# Patient Record
Sex: Female | Born: 1945 | Race: Black or African American | Hispanic: No | Smoking: Never smoker
Health system: Southern US, Community
[De-identification: ages and names within clinical notes are randomized; demographics above are authoritative.]

## PROBLEM LIST (undated history)

## (undated) DIAGNOSIS — Z8701 Personal history of pneumonia (recurrent): Secondary | ICD-10-CM

## (undated) DIAGNOSIS — E669 Obesity, unspecified: Secondary | ICD-10-CM

## (undated) DIAGNOSIS — M5030 Other cervical disc degeneration, unspecified cervical region: Secondary | ICD-10-CM

## (undated) DIAGNOSIS — I639 Cerebral infarction, unspecified: Secondary | ICD-10-CM

## (undated) DIAGNOSIS — E039 Hypothyroidism, unspecified: Secondary | ICD-10-CM

## (undated) DIAGNOSIS — M5136 Other intervertebral disc degeneration, lumbar region: Secondary | ICD-10-CM

## (undated) DIAGNOSIS — M51369 Other intervertebral disc degeneration, lumbar region without mention of lumbar back pain or lower extremity pain: Secondary | ICD-10-CM

## (undated) DIAGNOSIS — L80 Vitiligo: Secondary | ICD-10-CM

## (undated) DIAGNOSIS — R42 Dizziness and giddiness: Secondary | ICD-10-CM

## (undated) DIAGNOSIS — C9000 Multiple myeloma not having achieved remission: Secondary | ICD-10-CM

## (undated) DIAGNOSIS — J439 Emphysema, unspecified: Secondary | ICD-10-CM

## (undated) DIAGNOSIS — E785 Hyperlipidemia, unspecified: Secondary | ICD-10-CM

## (undated) DIAGNOSIS — I10 Essential (primary) hypertension: Secondary | ICD-10-CM

## (undated) HISTORY — PX: NEPHRECTOMY: SHX65

## (undated) HISTORY — DX: Vitiligo: L80

## (undated) HISTORY — DX: Obesity, unspecified: E66.9

## (undated) HISTORY — DX: Essential (primary) hypertension: I10

## (undated) HISTORY — DX: Hyperlipidemia, unspecified: E78.5

## (undated) HISTORY — DX: Emphysema, unspecified: J43.9

## (undated) HISTORY — DX: Personal history of pneumonia (recurrent): Z87.01

## (undated) HISTORY — DX: Cerebral infarction, unspecified: I63.9

## (undated) HISTORY — DX: Hypothyroidism, unspecified: E03.9

## (undated) HISTORY — DX: Multiple myeloma not having achieved remission: C90.00

## (undated) HISTORY — DX: Dizziness and giddiness: R42

---

## 1981-12-23 HISTORY — PX: TUBAL LIGATION: SHX77

## 1998-04-24 ENCOUNTER — Encounter: Admission: RE | Admit: 1998-04-24 | Discharge: 1998-07-23 | Payer: Self-pay

## 2000-12-31 ENCOUNTER — Other Ambulatory Visit: Admission: RE | Admit: 2000-12-31 | Discharge: 2000-12-31 | Payer: Self-pay | Admitting: Obstetrics and Gynecology

## 2005-12-27 ENCOUNTER — Ambulatory Visit: Payer: Self-pay | Admitting: Family Medicine

## 2006-05-12 ENCOUNTER — Encounter (INDEPENDENT_AMBULATORY_CARE_PROVIDER_SITE_OTHER): Payer: Self-pay | Admitting: *Deleted

## 2006-05-12 ENCOUNTER — Other Ambulatory Visit: Admission: RE | Admit: 2006-05-12 | Discharge: 2006-05-12 | Payer: Self-pay | Admitting: Family Medicine

## 2006-05-12 ENCOUNTER — Ambulatory Visit: Payer: Self-pay | Admitting: Family Medicine

## 2006-05-23 LAB — HM COLONOSCOPY

## 2006-07-15 ENCOUNTER — Ambulatory Visit: Payer: Self-pay | Admitting: Family Medicine

## 2006-11-17 ENCOUNTER — Ambulatory Visit: Payer: Self-pay | Admitting: Family Medicine

## 2007-05-13 ENCOUNTER — Other Ambulatory Visit: Admission: RE | Admit: 2007-05-13 | Discharge: 2007-05-13 | Payer: Self-pay | Admitting: Family Medicine

## 2007-05-14 ENCOUNTER — Encounter (INDEPENDENT_AMBULATORY_CARE_PROVIDER_SITE_OTHER): Payer: Self-pay | Admitting: *Deleted

## 2007-05-14 ENCOUNTER — Encounter: Payer: Self-pay | Admitting: Family Medicine

## 2007-05-14 ENCOUNTER — Ambulatory Visit: Payer: Self-pay | Admitting: Family Medicine

## 2007-05-15 ENCOUNTER — Encounter: Payer: Self-pay | Admitting: Family Medicine

## 2007-05-15 LAB — CONVERTED CEMR LAB
Candida species: NEGATIVE
Chlamydia, DNA Probe: NEGATIVE
Gardnerella vaginalis: NEGATIVE
Trichomonal Vaginitis: NEGATIVE

## 2007-06-18 ENCOUNTER — Ambulatory Visit: Payer: Self-pay | Admitting: Family Medicine

## 2007-06-18 LAB — CONVERTED CEMR LAB: TSH: 0.295 microintl units/mL — ABNORMAL LOW (ref 0.350–5.50)

## 2007-08-07 ENCOUNTER — Encounter: Payer: Self-pay | Admitting: Family Medicine

## 2007-08-17 ENCOUNTER — Ambulatory Visit: Payer: Self-pay | Admitting: Family Medicine

## 2007-08-17 LAB — CONVERTED CEMR LAB
Calcium: 8.9 mg/dL (ref 8.4–10.5)
Chloride: 105 meq/L (ref 96–112)
Glucose, Bld: 87 mg/dL (ref 70–99)
Potassium: 3.8 meq/L (ref 3.5–5.3)
Sodium: 140 meq/L (ref 135–145)

## 2007-08-20 ENCOUNTER — Encounter: Payer: Self-pay | Admitting: Family Medicine

## 2007-08-20 LAB — CONVERTED CEMR LAB
Albumin: 4.1 g/dL (ref 3.5–5.2)
Alkaline Phosphatase: 46 units/L (ref 39–117)
Bilirubin, Direct: <0.1 mg/dL (ref 0.0–0.3)

## 2007-11-06 ENCOUNTER — Ambulatory Visit (HOSPITAL_COMMUNITY): Admission: RE | Admit: 2007-11-06 | Discharge: 2007-11-06 | Payer: Self-pay | Admitting: Family Medicine

## 2007-11-11 ENCOUNTER — Ambulatory Visit: Payer: Self-pay | Admitting: Family Medicine

## 2007-11-12 ENCOUNTER — Ambulatory Visit (HOSPITAL_COMMUNITY): Admission: RE | Admit: 2007-11-12 | Discharge: 2007-11-12 | Payer: Self-pay | Admitting: Family Medicine

## 2007-11-13 ENCOUNTER — Encounter: Payer: Self-pay | Admitting: Family Medicine

## 2007-11-13 LAB — CONVERTED CEMR LAB
ALT: 9 units/L (ref 0–53)
AST: 12 units/L (ref 0–37)
Albumin: 4 g/dL (ref 3.5–5.2)
Alkaline Phosphatase: 47 units/L (ref 39–117)
Basophils Absolute: 0 10*3/uL (ref 0.0–0.1)
Basophils Relative: 0 % (ref 0–1)
Chloride: 107 meq/L (ref 96–112)
Cholesterol: 186 mg/dL (ref 0–200)
Creatinine, Ser: 0.95 mg/dL (ref 0.40–1.50)
Eosinophils Absolute: 0.1 10*3/uL — ABNORMAL LOW (ref 0.2–0.7)
Eosinophils Relative: 1 % (ref 0–5)
HCT: 31.2 % — ABNORMAL LOW (ref 39.0–52.0)
HDL: 39 mg/dL — ABNORMAL LOW (ref >39)
Hemoglobin: 11.1 g/dL — ABNORMAL LOW (ref 13.0–17.0)
MCHC: 35.6 g/dL (ref 30.0–36.0)
MCV: 86.7 fL (ref 78.0–100.0)
Neutrophils Relative %: 61 % (ref 43–77)
Potassium: 4.3 meq/L (ref 3.5–5.3)
Sodium: 141 meq/L (ref 135–145)
Total Bilirubin: 0.7 mg/dL (ref 0.3–1.2)
Total Protein: 8.9 g/dL — ABNORMAL HIGH (ref 6.0–8.3)

## 2007-11-16 ENCOUNTER — Encounter: Payer: Self-pay | Admitting: Family Medicine

## 2007-11-16 LAB — CONVERTED CEMR LAB: Retic Ct Pct: 0.8 % (ref 0.4–3.1)

## 2007-11-26 ENCOUNTER — Ambulatory Visit (HOSPITAL_COMMUNITY): Admission: RE | Admit: 2007-11-26 | Discharge: 2007-11-26 | Payer: Self-pay | Admitting: Family Medicine

## 2007-11-29 ENCOUNTER — Emergency Department (HOSPITAL_COMMUNITY): Admission: EM | Admit: 2007-11-29 | Discharge: 2007-11-29 | Payer: Self-pay | Admitting: Emergency Medicine

## 2007-12-04 ENCOUNTER — Encounter (INDEPENDENT_AMBULATORY_CARE_PROVIDER_SITE_OTHER): Payer: Self-pay | Admitting: Urology

## 2007-12-04 ENCOUNTER — Inpatient Hospital Stay (HOSPITAL_COMMUNITY): Admission: RE | Admit: 2007-12-04 | Discharge: 2007-12-08 | Payer: Self-pay | Admitting: Urology

## 2007-12-11 ENCOUNTER — Ambulatory Visit: Payer: Self-pay | Admitting: Vascular Surgery

## 2007-12-11 ENCOUNTER — Encounter (INDEPENDENT_AMBULATORY_CARE_PROVIDER_SITE_OTHER): Payer: Self-pay | Admitting: Internal Medicine

## 2007-12-11 ENCOUNTER — Inpatient Hospital Stay (HOSPITAL_COMMUNITY): Admission: EM | Admit: 2007-12-11 | Discharge: 2007-12-16 | Payer: Self-pay | Admitting: Emergency Medicine

## 2007-12-16 ENCOUNTER — Other Ambulatory Visit: Payer: Self-pay | Admitting: Cardiovascular Disease

## 2007-12-21 ENCOUNTER — Ambulatory Visit: Payer: Self-pay | Admitting: Family Medicine

## 2007-12-21 LAB — CONVERTED CEMR LAB
Potassium: 4.8 meq/L (ref 3.5–5.3)
Sodium: 136 meq/L (ref 135–145)

## 2007-12-24 ENCOUNTER — Encounter: Payer: Self-pay | Admitting: Family Medicine

## 2008-03-23 ENCOUNTER — Ambulatory Visit: Payer: Self-pay | Admitting: Family Medicine

## 2008-03-24 ENCOUNTER — Encounter: Payer: Self-pay | Admitting: Family Medicine

## 2008-03-24 LAB — CONVERTED CEMR LAB
ALT: 8 units/L (ref 0–35)
Albumin: 3.8 g/dL (ref 3.5–5.2)
Alkaline Phosphatase: 46 units/L (ref 39–117)
CO2: 22 meq/L (ref 19–32)
Creatinine, Ser: 1.12 mg/dL (ref 0.40–1.20)
HDL: 37 mg/dL — ABNORMAL LOW (ref >39)
Potassium: 4.1 meq/L (ref 3.5–5.3)
Sodium: 139 meq/L (ref 135–145)
Total CHOL/HDL Ratio: 4.8
Total Protein: 8.5 g/dL — ABNORMAL HIGH (ref 6.0–8.3)
Triglycerides: 138 mg/dL (ref <150)
VLDL: 28 mg/dL (ref 0–40)

## 2008-03-25 ENCOUNTER — Encounter: Payer: Self-pay | Admitting: Family Medicine

## 2008-03-25 LAB — CONVERTED CEMR LAB: RBC / HPF: NONE SEEN (ref <3)

## 2008-03-31 ENCOUNTER — Encounter (INDEPENDENT_AMBULATORY_CARE_PROVIDER_SITE_OTHER): Payer: Self-pay | Admitting: *Deleted

## 2008-03-31 DIAGNOSIS — E669 Obesity, unspecified: Secondary | ICD-10-CM

## 2008-03-31 DIAGNOSIS — E039 Hypothyroidism, unspecified: Secondary | ICD-10-CM

## 2008-03-31 DIAGNOSIS — R42 Dizziness and giddiness: Secondary | ICD-10-CM | POA: Insufficient documentation

## 2008-04-27 ENCOUNTER — Encounter: Payer: Self-pay | Admitting: Family Medicine

## 2008-04-27 ENCOUNTER — Ambulatory Visit: Payer: Self-pay | Admitting: Family Medicine

## 2008-04-27 ENCOUNTER — Other Ambulatory Visit: Admission: RE | Admit: 2008-04-27 | Discharge: 2008-04-27 | Payer: Self-pay | Admitting: Family Medicine

## 2008-04-28 ENCOUNTER — Encounter: Payer: Self-pay | Admitting: Family Medicine

## 2008-04-28 LAB — CONVERTED CEMR LAB
GC Probe Amp, Genital: NEGATIVE
Trichomonal Vaginitis: NEGATIVE

## 2008-05-03 ENCOUNTER — Ambulatory Visit (HOSPITAL_COMMUNITY): Admission: RE | Admit: 2008-05-03 | Discharge: 2008-05-03 | Payer: Self-pay | Admitting: Family Medicine

## 2008-05-09 ENCOUNTER — Ambulatory Visit: Payer: Self-pay | Admitting: Family Medicine

## 2008-05-10 ENCOUNTER — Encounter (HOSPITAL_COMMUNITY): Admission: RE | Admit: 2008-05-10 | Discharge: 2008-06-09 | Payer: Self-pay | Admitting: Family Medicine

## 2008-06-23 ENCOUNTER — Ambulatory Visit: Payer: Self-pay | Admitting: Family Medicine

## 2008-07-02 ENCOUNTER — Encounter: Payer: Self-pay | Admitting: Family Medicine

## 2008-07-02 LAB — CONVERTED CEMR LAB
ALT: 12 units/L (ref 0–35)
AST: 16 units/L (ref 0–37)
Alkaline Phosphatase: 43 units/L (ref 39–117)
Bilirubin, Direct: 0.5 mg/dL — ABNORMAL HIGH (ref 0.0–0.3)
Cholesterol: 169 mg/dL (ref 0–200)
Indirect Bilirubin: 0.1 mg/dL (ref 0.0–0.9)
Total Bilirubin: 0.6 mg/dL (ref 0.3–1.2)
Total CHOL/HDL Ratio: 3.7
Total Protein: 8.8 g/dL — ABNORMAL HIGH (ref 6.0–8.3)

## 2008-07-05 ENCOUNTER — Encounter: Payer: Self-pay | Admitting: Family Medicine

## 2008-07-05 LAB — CONVERTED CEMR LAB
Chloride: 105 meq/L (ref 96–112)
Glucose, Bld: 57 mg/dL — ABNORMAL LOW (ref 70–99)
Sodium: 141 meq/L (ref 135–145)

## 2008-07-06 ENCOUNTER — Ambulatory Visit: Payer: Self-pay | Admitting: Family Medicine

## 2008-07-22 ENCOUNTER — Telehealth: Payer: Self-pay | Admitting: Family Medicine

## 2008-07-22 ENCOUNTER — Encounter: Payer: Self-pay | Admitting: Family Medicine

## 2008-08-10 ENCOUNTER — Encounter: Payer: Self-pay | Admitting: Family Medicine

## 2008-11-10 ENCOUNTER — Encounter: Payer: Self-pay | Admitting: Family Medicine

## 2009-05-25 ENCOUNTER — Ambulatory Visit: Payer: Self-pay | Admitting: Family Medicine

## 2009-05-25 DIAGNOSIS — IMO0002 Reserved for concepts with insufficient information to code with codable children: Secondary | ICD-10-CM

## 2009-05-25 DIAGNOSIS — M542 Cervicalgia: Secondary | ICD-10-CM

## 2009-05-25 DIAGNOSIS — M6740 Ganglion, unspecified site: Secondary | ICD-10-CM | POA: Insufficient documentation

## 2009-05-26 ENCOUNTER — Encounter: Payer: Self-pay | Admitting: Family Medicine

## 2009-05-26 LAB — CONVERTED CEMR LAB
Albumin: 4 g/dL (ref 3.5–5.2)
Basophils Absolute: 0 10*3/uL (ref 0.0–0.1)
Basophils Relative: 0 % (ref 0–1)
CO2: 23 meq/L (ref 19–32)
Calcium: 9.2 mg/dL (ref 8.4–10.5)
Chloride: 106 meq/L (ref 96–112)
Glucose, Bld: 80 mg/dL (ref 70–99)
HCT: 31.5 % — ABNORMAL LOW (ref 36.0–46.0)
HDL: 33 mg/dL — ABNORMAL LOW (ref >39)
LDL Cholesterol: 115 mg/dL — ABNORMAL HIGH (ref 0–99)
Lymphocytes Relative: 32 % (ref 12–46)
Monocytes Absolute: 0.5 10*3/uL (ref 0.1–1.0)
Monocytes Relative: 8 % (ref 3–12)
Platelets: 274 10*3/uL (ref 150–400)
Potassium: 4.6 meq/L (ref 3.5–5.3)
RBC: 3.76 M/uL — ABNORMAL LOW (ref 3.87–5.11)
Sodium: 141 meq/L (ref 135–145)
Total Bilirubin: 0.4 mg/dL (ref 0.3–1.2)
Total CHOL/HDL Ratio: 5.9

## 2009-05-29 ENCOUNTER — Ambulatory Visit (HOSPITAL_COMMUNITY): Admission: RE | Admit: 2009-05-29 | Discharge: 2009-05-29 | Payer: Self-pay | Admitting: Family Medicine

## 2009-06-01 ENCOUNTER — Telehealth: Payer: Self-pay | Admitting: Family Medicine

## 2009-07-07 ENCOUNTER — Other Ambulatory Visit: Admission: RE | Admit: 2009-07-07 | Discharge: 2009-07-07 | Payer: Self-pay | Admitting: Family Medicine

## 2009-07-07 ENCOUNTER — Encounter: Payer: Self-pay | Admitting: Family Medicine

## 2009-07-07 ENCOUNTER — Ambulatory Visit (HOSPITAL_COMMUNITY): Admission: RE | Admit: 2009-07-07 | Discharge: 2009-07-07 | Payer: Self-pay | Admitting: Family Medicine

## 2009-07-07 ENCOUNTER — Ambulatory Visit: Payer: Self-pay | Admitting: Family Medicine

## 2009-07-08 ENCOUNTER — Encounter: Payer: Self-pay | Admitting: Family Medicine

## 2009-07-08 LAB — CONVERTED CEMR LAB
Chlamydia, DNA Probe: NEGATIVE
GC Probe Amp, Genital: NEGATIVE

## 2009-07-10 ENCOUNTER — Ambulatory Visit (HOSPITAL_COMMUNITY): Admission: RE | Admit: 2009-07-10 | Discharge: 2009-07-10 | Payer: Self-pay | Admitting: Family Medicine

## 2009-07-10 ENCOUNTER — Encounter: Payer: Self-pay | Admitting: Family Medicine

## 2009-07-17 ENCOUNTER — Encounter: Payer: Self-pay | Admitting: Family Medicine

## 2009-07-18 ENCOUNTER — Encounter: Payer: Self-pay | Admitting: Family Medicine

## 2009-07-18 LAB — CONVERTED CEMR LAB
Bilirubin Urine: NEGATIVE
Hemoglobin, Urine: NEGATIVE
Ketones, ur: NEGATIVE mg/dL
Nitrite: NEGATIVE
RBC / HPF: NONE SEEN (ref <3)
Urine Glucose: NEGATIVE mg/dL

## 2009-07-21 ENCOUNTER — Encounter: Payer: Self-pay | Admitting: Family Medicine

## 2009-10-24 ENCOUNTER — Ambulatory Visit: Payer: Self-pay | Admitting: Family Medicine

## 2009-10-24 DIAGNOSIS — E559 Vitamin D deficiency, unspecified: Secondary | ICD-10-CM

## 2009-10-29 ENCOUNTER — Encounter: Payer: Self-pay | Admitting: Family Medicine

## 2009-10-31 ENCOUNTER — Telehealth: Payer: Self-pay | Admitting: Family Medicine

## 2009-11-06 LAB — CONVERTED CEMR LAB
ALT: 13 units/L (ref 0–35)
AST: 12 units/L (ref 0–37)
Albumin: 4 g/dL (ref 3.5–5.2)
Alkaline Phosphatase: 40 units/L (ref 39–117)
Calcium: 9 mg/dL (ref 8.4–10.5)
Cholesterol: 155 mg/dL (ref 0–200)
HDL: 43 mg/dL (ref >39)
Sodium: 136 meq/L (ref 135–145)
TSH: 1.469 microintl units/mL (ref 0.350–4.500)
Total CHOL/HDL Ratio: 3.6

## 2010-01-15 ENCOUNTER — Telehealth: Payer: Self-pay | Admitting: Family Medicine

## 2010-02-14 ENCOUNTER — Telehealth: Payer: Self-pay | Admitting: Family Medicine

## 2010-03-20 ENCOUNTER — Encounter: Payer: Self-pay | Admitting: Family Medicine

## 2010-04-02 ENCOUNTER — Telehealth: Payer: Self-pay | Admitting: Family Medicine

## 2010-04-04 LAB — CONVERTED CEMR LAB
ALT: 14 units/L (ref 0–35)
AST: 16 units/L (ref 0–37)
Alkaline Phosphatase: 39 units/L (ref 39–117)
Basophils Absolute: 0 10*3/uL (ref 0.0–0.1)
Basophils Relative: 0 % (ref 0–1)
Bilirubin, Direct: 0.4 mg/dL — ABNORMAL HIGH (ref 0.0–0.3)
CO2: 24 meq/L (ref 19–32)
Calcium: 9.3 mg/dL (ref 8.4–10.5)
Cholesterol: 139 mg/dL (ref 0–200)
Creatinine, Ser: 1.14 mg/dL (ref 0.40–1.20)
Eosinophils Absolute: 0.1 10*3/uL (ref 0.0–0.7)
Glucose, Bld: 86 mg/dL (ref 70–99)
Hemoglobin: 11.6 g/dL — ABNORMAL LOW (ref 12.0–15.0)
Indirect Bilirubin: 0.1 mg/dL (ref 0.0–0.9)
MCHC: 34.3 g/dL (ref 30.0–36.0)
MCV: 87.1 fL (ref 78.0–100.0)
Monocytes Absolute: 0.5 10*3/uL (ref 0.1–1.0)
Neutro Abs: 3.7 10*3/uL (ref 1.7–7.7)
Neutrophils Relative %: 59 % (ref 43–77)
RDW: 16.5 % — ABNORMAL HIGH (ref 11.5–15.5)
Sodium: 141 meq/L (ref 135–145)
Total Bilirubin: 0.5 mg/dL (ref 0.3–1.2)
Total CHOL/HDL Ratio: 3.9

## 2010-05-08 ENCOUNTER — Emergency Department (HOSPITAL_COMMUNITY): Admission: EM | Admit: 2010-05-08 | Discharge: 2010-05-08 | Payer: Self-pay | Admitting: Emergency Medicine

## 2010-05-08 ENCOUNTER — Telehealth: Payer: Self-pay | Admitting: Family Medicine

## 2010-05-18 ENCOUNTER — Ambulatory Visit: Payer: Self-pay | Admitting: Family Medicine

## 2010-05-18 DIAGNOSIS — F329 Major depressive disorder, single episode, unspecified: Secondary | ICD-10-CM

## 2010-05-18 LAB — CONVERTED CEMR LAB
Bilirubin Urine: NEGATIVE
Ketones, urine, test strip: NEGATIVE
Nitrite: NEGATIVE
Protein, U semiquant: 100
Specific Gravity, Urine: <1.005
Urobilinogen, UA: 0.2

## 2010-05-19 ENCOUNTER — Encounter: Payer: Self-pay | Admitting: Family Medicine

## 2010-05-28 ENCOUNTER — Telehealth: Payer: Self-pay | Admitting: Physician Assistant

## 2010-05-29 ENCOUNTER — Ambulatory Visit: Payer: Self-pay | Admitting: Family Medicine

## 2010-05-29 DIAGNOSIS — B351 Tinea unguium: Secondary | ICD-10-CM

## 2010-06-03 DIAGNOSIS — K5900 Constipation, unspecified: Secondary | ICD-10-CM | POA: Insufficient documentation

## 2010-06-05 ENCOUNTER — Ambulatory Visit (HOSPITAL_COMMUNITY): Admission: RE | Admit: 2010-06-05 | Discharge: 2010-06-05 | Payer: Self-pay | Admitting: Family Medicine

## 2010-06-06 ENCOUNTER — Telehealth: Payer: Self-pay | Admitting: Family Medicine

## 2010-07-16 ENCOUNTER — Telehealth: Payer: Self-pay | Admitting: Family Medicine

## 2010-07-18 ENCOUNTER — Ambulatory Visit: Payer: Self-pay | Admitting: Family Medicine

## 2010-07-18 DIAGNOSIS — H9190 Unspecified hearing loss, unspecified ear: Secondary | ICD-10-CM | POA: Insufficient documentation

## 2010-07-18 LAB — CONVERTED CEMR LAB: OCCULT 1: NEGATIVE

## 2010-09-11 ENCOUNTER — Telehealth: Payer: Self-pay | Admitting: Family Medicine

## 2010-09-17 ENCOUNTER — Telehealth: Payer: Self-pay | Admitting: Family Medicine

## 2010-10-17 ENCOUNTER — Ambulatory Visit: Payer: Self-pay | Admitting: Family Medicine

## 2010-10-26 ENCOUNTER — Telehealth: Payer: Self-pay | Admitting: Family Medicine

## 2010-11-06 ENCOUNTER — Telehealth: Payer: Self-pay | Admitting: Family Medicine

## 2010-11-12 ENCOUNTER — Telehealth (INDEPENDENT_AMBULATORY_CARE_PROVIDER_SITE_OTHER): Payer: Self-pay | Admitting: *Deleted

## 2010-11-20 ENCOUNTER — Encounter: Payer: Self-pay | Admitting: Family Medicine

## 2010-11-20 LAB — CONVERTED CEMR LAB
ALT: 10 units/L (ref 0–35)
AST: 11 units/L (ref 0–37)
Albumin: 3.8 g/dL (ref 3.5–5.2)
Bilirubin, Direct: 0.8 mg/dL — ABNORMAL HIGH (ref 0.0–0.3)
CO2: 23 meq/L (ref 19–32)
Calcium: 9.1 mg/dL (ref 8.4–10.5)
Glucose, Bld: 93 mg/dL (ref 70–99)
HDL: 37 mg/dL — ABNORMAL LOW (ref >39)
Potassium: 3.7 meq/L (ref 3.5–5.3)
Sodium: 138 meq/L (ref 135–145)
TSH: 0.875 microintl units/mL (ref 0.350–4.500)
Total Bilirubin: 0.4 mg/dL (ref 0.3–1.2)
Total CHOL/HDL Ratio: 3.9
VLDL: 32 mg/dL (ref 0–40)

## 2010-12-03 ENCOUNTER — Telehealth: Payer: Self-pay | Admitting: Family Medicine

## 2010-12-04 ENCOUNTER — Ambulatory Visit: Payer: Self-pay | Admitting: Family Medicine

## 2010-12-04 DIAGNOSIS — J209 Acute bronchitis, unspecified: Secondary | ICD-10-CM | POA: Insufficient documentation

## 2010-12-12 ENCOUNTER — Telehealth: Payer: Self-pay | Admitting: Family Medicine

## 2011-01-13 ENCOUNTER — Encounter: Payer: Self-pay | Admitting: Family Medicine

## 2011-01-13 ENCOUNTER — Encounter: Payer: Self-pay | Admitting: *Deleted

## 2011-01-18 ENCOUNTER — Telehealth: Payer: Self-pay | Admitting: Family Medicine

## 2011-01-22 NOTE — Progress Notes (Signed)
Summary: lab  Phone Note Call from Patient   Summary of Call: pt needs to get chol. checked [phone] Initial call taken by: Rudene Anda,  April 02, 2010 1:53 PM  Follow-up for Phone Call        labs ordered, called patient, line busy Follow-up by: Adella Hare LPN,  April 02, 2010 3:48 PM  Additional Follow-up for Phone Call Additional follow up Details #1::        Phone Call Completed Additional Follow-up by: Adella Hare LPN,  April 02, 2010 4:26 PM

## 2011-01-22 NOTE — Letter (Signed)
Summary: consults  consults   Imported By: Lind Guest 05/23/2010 10:42:17  _____________________________________________________________________  External Attachment:    Type:   Image     Comment:   External Document

## 2011-01-22 NOTE — Progress Notes (Signed)
Summary: WANTS A REF TO A DERMATOLOGY  Phone Note Call from Patient   Summary of Call: HAS WHITE SPLOCHS ON HER FACE  AND WANTS A REFERRALL TO A DERMATOLOGY  CALL BACK   [phone] OR [phone] Initial call taken by: Lind Guest,  September 11, 2010 9:58 AM  Follow-up for Phone Call        pls refer to derm eval lesions on face, also offer the flu vac when you talk with her Follow-up by: [name] MD,  September 11, 2010 10:06 AM  Additional Follow-up for Phone Call Additional follow up Details #1::        pt going to call back. Additional Follow-up by: Rudene Anda,  September 11, 2010 1:44 PM

## 2011-01-22 NOTE — Progress Notes (Signed)
Summary: referral  Phone Note Call from Patient   Summary of Call: pt has appt at dr. halls office for 09/27/2010 11:00. pt notified  Initial call taken by: Rudene Anda,  September 17, 2010 4:19 PM

## 2011-01-22 NOTE — Progress Notes (Signed)
  Phone Note Other Incoming   Summary of Call: On call note:  05-27-10 5:46 am. Pt called with c/o intermittent lower abd pain.  She states that she feels like she needs to have a BM, is constipated, and last BM was 1 week ago.  She denies fever.  Pt is asking if it is OK to try MOM.  Advised pt she could use MOM, but if her pain continues today, or worsens she must go to the ER. Initial call taken by: Esperanza Sheets PA,  May 28, 2010 8:41 AM     Appended Document:  Pls call pt today to check on her.  If she is still having pain pls schedule an appt for her.  Appended Document:  patient has appt tomorrow and states is coming

## 2011-01-22 NOTE — Assessment & Plan Note (Signed)
Summary: office visit   Vital Signs:  Patient profile:   65 year old female Menstrual status:  postmenopausal Height:      65.5 inches Weight:      233 pounds BMI:     38.32 O2 Sat:      97 % Pulse rate:   71 / minute Pulse rhythm:   regular Resp:     16 per minute BP sitting:   140 / 78  (left arm) Cuff size:   large  Vitals Entered By: Everitt Amber LPN (July 18, 2010 9:16 AM)  Nutrition Counseling: Patient's BMI is greater than 25 and therefore counseled on weight management options. CC: Follow up chronic problems   CC:  Follow up chronic problems.  History of Present Illness: Reports  that she is doing better than she had been Denies recent fever or chills. Denies sinus pressure, nasal congestion , ear pain or sore throat. Denies chest congestion, or cough productive of sputum. Denies chest pain, palpitations, PND, orthopnea or leg swelling. Denies abdominal pain, nausea, vomitting, diarrhea or constipation. Denies change in bowel movements or bloody stool. Denies dysuria , frequency, incontinence or hesitancy. Denies  joint pain, swelling, or reduced mobility. Denies headaches, vertigo, seizures.  Denies  rash, lesions, or itch.     Current Medications (verified): 1)  Hydralazine Hcl 25 Mg  Tabs (Hydralazine Hcl) .... Two Tabs By Mouth Three Times A Day 2)  Klor-Con M20 20 Meq  Tbcr (Potassium Chloride Crys Cr) .... Take 1 Tablet By Mouth Once A Day 3)  Aspirin 81 Mg  Tbec (Aspirin) .... One Tab By Mouth Once Daily 4)  Zolpidem Tartrate 10 Mg  Tabs (Zolpidem Tartrate) .... One Tab By Mouth At Bedtime 5)  Amlodipine Besylate 10 Mg  Tabs (Amlodipine Besylate) .... One Tab By Mouth Once Daily 6)  Benazepril Hcl 20 Mg Tabs (Benazepril Hcl) .... Take 1 Tablet By Mouth Once A Day 7)  Synthroid 100 Mcg  Tabs (Levothyroxine Sodium) .... One Tab By Mouth Monday Through Friday and One Half Tab By Mouth On Saturday and Sunday 8)  Clonidine Hcl 0.2 Mg  Tabs (Clonidine Hcl)  .... Take Two Tablets Three Times Daily 9)  One-A-Day Extras Antioxidant  Caps (Multiple Vitamins-Minerals) .... Take 1 Tablet By Mouth Once A Day 10)  Eql Fish Oil 1000 Mg Caps (Omega-3 Fatty Acids) .... Take 1 Tablet By Mouth Two Times A Day 11)  Lovastatin 40 Mg Tabs (Lovastatin) .... Take 1 Tab By Mouth At Bedtime 12)  Oscal 500/200 D-3 500-200 Mg-Unit Tabs (Calcium-Vitamin D) .... Take 1 Tablet By Mouth Three Times A Day 13)  Fluoxetine Hcl 10 Mg Tabs (Fluoxetine Hcl) .... Take 1 Tablet By Mouth Once A Day 14)  Miralax  Powd (Polyethylene Glycol 3350) .Marland KitchenMarland KitchenMarland Kitchen 17 Gm in 8 Ounces Water Daily 15)  Gnp Stool Softener 100 Mg Caps (Docusate Sodium) .... As Needed  Allergies (verified): 1)  ! Morphine  Review of Systems      See HPI General:  Complains of fatigue. Eyes:  Denies discharge and red eye. ENT:  Complains of decreased hearing. GU:  Denies dysuria and urinary frequency. Psych:  Complains of anxiety and depression; denies irritability, mental problems, panic attacks, suicidal thoughts/plans, thoughts of violence, and unusual visions or sounds; improved symptoms , though taking the fluoxetine inconsistently. Endo:  Denies cold intolerance, excessive thirst, excessive urination, and weight change. Heme:  Denies abnormal bruising and bleeding. Allergy:  Denies hives or rash and itching eyes.  Physical Exam  General:  Well-developed,obese,in no acute distress; alert,appropriate and cooperative throughout examination HEENT: No facial asymmetry,  EOMI, No sinus tenderness, TM's Clear, oropharynx  pink and moist.   Chest: Clear to auscultation bilaterally.  CVS: S1, S2, No murmurs, No S3.   Abd: Soft,obese, no organomegaly or masses, suprapubic tenderness. MS: Adequate ROM spine, hips, shoulders and knees.  Ext: No edema.   CNS: CN 2-12 intact, power tone and sensation normal throughout.   Skin: Intact, no visible lesions or rashes. onychomycosis, severe Psych: Good eye contact, normal  affect.  Memory intact,not anxious or    depressed appearing.    Impression & Recommendations:  Problem # 1:  SPECIAL SCREENING FOR MALIGNANT NEOPLASMS COLON (ICD-V76.51) Assessment Comment Only  Orders: Hemoccult Guaiac-1 spec.(in office) ([id number])  Problem # 2:  HEARING LOSS (ICD-389.9) Assessment: Comment Only  Orders: ENT Referral (ENT)  Problem # 3:  HYPERTENSION (ICD-401.9) Assessment: Unchanged  The following medications were removed from the medication list:    Furosemide 80 Mg Tabs (Furosemide) ..... One tab by mouth once daily Her updated medication list for this problem includes:    Hydralazine Hcl 25 Mg Tabs (Hydralazine hcl) .Marland Kitchen..Marland Kitchen Two tabs by mouth three times a day    Amlodipine Besylate 10 Mg Tabs (Amlodipine besylate) ..... One tab by mouth once daily    Benazepril Hcl 20 Mg Tabs (Benazepril hcl) .Marland Kitchen... Take 1 tablet by mouth once a day    Clonidine Hcl 0.2 Mg Tabs (Clonidine hcl) .Marland Kitchen... Take two tablets three times daily  BP today: 140/78 Prior BP: 140/70 (05/29/2010)  Labs Reviewed: K+: 4.1 (04/04/2010) Creat: : 1.14 (04/04/2010)   Chol: 139 (04/04/2010)   HDL: 36 (04/04/2010)   LDL: 71 (04/04/2010)   TG: 160 (04/04/2010)  Problem # 4:  OBESITY, UNSPECIFIED (ICD-278.00) Assessment: Improved  Ht: 65.5 (07/18/2010)   Wt: 233 (07/18/2010)   BMI: 38.32 (07/18/2010)  Problem # 5:  DEPRESSION (ICD-311) Assessment: Improved  Her updated medication list for this problem includes:    Fluoxetine Hcl 10 Mg Tabs (Fluoxetine hcl) .Marland Kitchen... Take 1 tablet by mouth once a day  Complete Medication List: 1)  Hydralazine Hcl 25 Mg Tabs (Hydralazine hcl) .... Two tabs by mouth three times a day 2)  Klor-con M20 20 Meq Tbcr (Potassium chloride crys cr) .... Take 1 tablet by mouth once a day 3)  Aspirin 81 Mg Tbec (Aspirin) .... One tab by mouth once daily 4)  Zolpidem Tartrate 10 Mg Tabs (Zolpidem tartrate) .... One tab by mouth at bedtime 5)  Amlodipine Besylate 10 Mg Tabs  (Amlodipine besylate) .... One tab by mouth once daily 6)  Benazepril Hcl 20 Mg Tabs (Benazepril hcl) .... Take 1 tablet by mouth once a day 7)  Synthroid 100 Mcg Tabs (Levothyroxine sodium) .... One tab by mouth monday through friday and one half tab by mouth on saturday and sunday 8)  Clonidine Hcl 0.2 Mg Tabs (Clonidine hcl) .... Take two tablets three times daily 9)  One-a-day Extras Antioxidant Caps (Multiple vitamins-minerals) .... Take 1 tablet by mouth once a day 10)  Eql Fish Oil 1000 Mg Caps (Omega-3 fatty acids) .... Take 1 tablet by mouth two times a day 11)  Lovastatin 40 Mg Tabs (Lovastatin) .... Take 1 tab by mouth at bedtime 12)  Oscal 500/200 D-3 500-200 Mg-unit Tabs (Calcium-vitamin d) .... Take 1 tablet by mouth three times a day 13)  Fluoxetine Hcl 10 Mg Tabs (Fluoxetine hcl) .... Take 1 tablet by  mouth once a day 14)  Miralax Powd (Polyethylene glycol 3350) .Marland KitchenMarland Kitchen. 17 gm in 8 ounces water daily 15)  Gnp Stool Softener 100 Mg Caps (Docusate sodium) .... As needed  Patient Instructions: 1)  Please schedule a follow-up appointment in 3 months. 2)  It is important that you exercise regularly at least 30 minutes 5 times a week. If you develop chest pain, have severe difficulty breathing, or feel very tired , stop exercising immediately and seek medical attention. 3)  You need to lose weight. Consider a lower calorie diet and regular exercise. Congrats , pls keep it up. 4)  pls take your fluoxetine every day, this is important 5)  you will be refered to eNT for hearing loss Prescriptions: HYDRALAZINE HCL 25 MG  TABS (HYDRALAZINE HCL) Two tabs by mouth three times a day  #180 Each x 3   Entered by:   Everitt Amber LPN   Authorized by:   [name] MD   Signed by:   Everitt Amber LPN on 95/62/1308   Method used:   Electronically to        [facility]  [address]* (retail)       [address]       [place], [place]  [address]       Ph: [phone]       Fax:  [phone]   RxID:   [id number] AMLODIPINE BESYLATE 10 MG  TABS (AMLODIPINE BESYLATE) one tab by mouth once daily  #30 x 3   Entered by:   Everitt Amber LPN   Authorized by:   [name] MD   Signed by:   Everitt Amber LPN on 38/75/6433   Method used:   Electronically to        [facility] [phone]* (retail)       [address] [place], [place]  [address]       Ph: [phone] or [phone]       Fax: [phone]   RxID:   [id number] HYDRALAZINE HCL 25 MG  TABS (HYDRALAZINE HCL) Two tabs by mouth three times a day  #180 Each x 3   Entered by:   Everitt Amber LPN   Authorized by:   [name] MD   Signed by:   Everitt Amber LPN on 10/62/6948   Method used:   Electronically to        [facility] [phone]* (retail)       [address] [place], [place]  [address]       Ph: [phone] or [phone]       Fax: [phone]   RxID:   [id number]   Laboratory Results    Stool - Occult Blood Hemmoccult #1: negative Date: 07/18/2010 Comments: [id number] 8/11 118 1012

## 2011-01-22 NOTE — Assessment & Plan Note (Signed)
Summary: OV   Vital Signs:  Patient profile:   65 year old female Menstrual status:  postmenopausal Height:      65.5 inches Weight:      239 pounds BMI:     39.31 O2 Sat:      98 % Pulse rate:   69 / minute Pulse rhythm:   regular Resp:     16 per minute BP sitting:   140 / 70  (left arm) Cuff size:   large  Vitals Entered By: Everitt Amber LPN (May 29, 1609 11:33 AM)  Nutrition Counseling: Patient's BMI is greater than 25 and therefore counseled on weight management options. CC: Follow up chronic problems   CC:  Follow up chronic problems.  History of Present Illness: pt reports improvement in her symptoms of depression since strting prozac. She is less tearful, and withdrawn. She denies suicidal or homicdal ideation. She has had no recent fever or chills. She denies head or chest congestion, dysuria or frequency. She debies chest pain, palpitation , pND or orthopnea.  Current Medications (verified): 1)  Hydralazine Hcl 25 Mg  Tabs (Hydralazine Hcl) .... Two Tabs By Mouth Three Times A Day 2)  Klor-Con M20 20 Meq  Tbcr (Potassium Chloride Crys Cr) .... Take 1 Tablet By Mouth Once A Day 3)  Furosemide 80 Mg  Tabs (Furosemide) .... One Tab By Mouth Once Daily 4)  Aspirin 81 Mg  Tbec (Aspirin) .... One Tab By Mouth Once Daily 5)  Zolpidem Tartrate 10 Mg  Tabs (Zolpidem Tartrate) .... One Tab By Mouth At Bedtime 6)  Amlodipine Besylate 10 Mg  Tabs (Amlodipine Besylate) .... One Tab By Mouth Once Daily 7)  Benazepril Hcl 20 Mg Tabs (Benazepril Hcl) .... Take 1 Tablet By Mouth Once A Day 8)  Synthroid 100 Mcg  Tabs (Levothyroxine Sodium) .... One Tab By Mouth Monday Through Friday and One Half Tab By Mouth On Saturday and Sunday 9)  Clonidine Hcl 0.2 Mg  Tabs (Clonidine Hcl) .... Take Two Tablets Three Times Daily 10)  One-A-Day Extras Antioxidant  Caps (Multiple Vitamins-Minerals) .... Take 1 Tablet By Mouth Once A Day 11)  Eql Fish Oil 1000 Mg Caps (Omega-3 Fatty Acids) ....  Take 1 Tablet By Mouth Two Times A Day 12)  Lovastatin 40 Mg Tabs (Lovastatin) .... Take 1 Tab By Mouth At Bedtime 13)  Oscal 500/200 D-3 500-200 Mg-Unit Tabs (Calcium-Vitamin D) .... Take 1 Tablet By Mouth Three Times A Day 14)  Fluoxetine Hcl 10 Mg Tabs (Fluoxetine Hcl) .... Take 1 Tablet By Mouth Once A Day  Allergies (verified): 1)  ! Morphine  Review of Systems General:  Complains of fatigue; denies chills and fever. Resp:  Denies cough and sputum productive. GI:  Complains of abdominal pain and constipation; denies diarrhea, nausea, and vomiting; was having severe lower abd pain last Sunday, mOM help. GU:  Denies dysuria and urinary frequency. MS:  Complains of joint pain and stiffness. Derm:  Complains of changes in nail beds; wants to see podiatrty, thickened toenails, difficulty cutting nails. Endo:  Denies cold intolerance, excessive hunger, excessive thirst, excessive urination, heat intolerance, polyuria, and weight change. Heme:  Denies abnormal bruising and bleeding. Allergy:  Complains of seasonal allergies.  Physical Exam  General:  Well-developed,obese,in no acute distress; alert,appropriate and cooperative throughout examination HEENT: No facial asymmetry,  EOMI, No sinus tenderness, TM's Clear, oropharynx  pink and moist.   Chest: Clear to auscultation bilaterally.  CVS: S1, S2, No murmurs,  No S3.   Abd: Soft,obese, no organomegaly or masses, suprapubic tenderness. MS: Adequate ROM spine, hips, shoulders and knees.  Ext: No edema.   CNS: CN 2-12 intact, power tone and sensation normal throughout.   Skin: Intact, no visible lesions or rashes. onychomycosis, severe Psych: Good eye contact, normal affect.  Memory intact,  depressed appearing.    Impression & Recommendations:  Problem # 1:  CONSTIPATION (ICD-564.00) Assessment Comment Only  Her updated medication list for this problem includes:    Miralax Powd (Polyethylene glycol 3350) .Marland KitchenMarland KitchenMarland KitchenMarland Kitchen 17 gm in 8 ounces  water daily  Discussed dietary fiber measures and increased water intake.   Problem # 2:  DEPRESSION (ICD-311) Assessment: Improved  Her updated medication list for this problem includes:    Fluoxetine Hcl 10 Mg Tabs (Fluoxetine hcl) .Marland Kitchen... Take 1 tablet by mouth once a day  Problem # 3:  HYPERTENSION (ICD-401.9) Assessment: Unchanged  Her updated medication list for this problem includes:    Hydralazine Hcl 25 Mg Tabs (Hydralazine hcl) .Marland Kitchen..Marland Kitchen Two tabs by mouth three times a day    Furosemide 80 Mg Tabs (Furosemide) ..... One tab by mouth once daily    Amlodipine Besylate 10 Mg Tabs (Amlodipine besylate) ..... One tab by mouth once daily    Benazepril Hcl 20 Mg Tabs (Benazepril hcl) .Marland Kitchen... Take 1 tablet by mouth once a day    Clonidine Hcl 0.2 Mg Tabs (Clonidine hcl) .Marland Kitchen... Take two tablets three times daily  BP today: 140/70 Prior BP: 140/70 (05/18/2010)  Labs Reviewed: K+: 4.1 (04/04/2010) Creat: : 1.14 (04/04/2010)   Chol: 139 (04/04/2010)   HDL: 36 (04/04/2010)   LDL: 71 (04/04/2010)   TG: 160 (04/04/2010)  Problem # 4:  ONYCHOMYCOSIS (ICD-110.1) Assessment: Deteriorated  Orders: Podiatry Referral (Podiatry)  Problem # 5:  UNSPECIFIED HYPOTHYROIDISM (ICD-244.9) Assessment: Unchanged  Her updated medication list for this problem includes:    Synthroid 100 Mcg Tabs (Levothyroxine sodium) ..... One tab by mouth monday through friday and one half tab by mouth on saturday and sunday  Labs Reviewed: TSH: 0.728 (04/04/2010)    Chol: 139 (04/04/2010)   HDL: 36 (04/04/2010)   LDL: 71 (04/04/2010)   TG: 160 (04/04/2010)  Problem # 6:  OBESITY, UNSPECIFIED (ICD-278.00) Assessment: Improved  Ht: 65.5 (05/29/2010)   Wt: 239 (05/29/2010)   BMI: 39.31 (05/29/2010)  Complete Medication List: 1)  Hydralazine Hcl 25 Mg Tabs (Hydralazine hcl) .... Two tabs by mouth three times a day 2)  Klor-con M20 20 Meq Tbcr (Potassium chloride crys cr) .... Take 1 tablet by mouth once a day 3)   Furosemide 80 Mg Tabs (Furosemide) .... One tab by mouth once daily 4)  Aspirin 81 Mg Tbec (Aspirin) .... One tab by mouth once daily 5)  Zolpidem Tartrate 10 Mg Tabs (Zolpidem tartrate) .... One tab by mouth at bedtime 6)  Amlodipine Besylate 10 Mg Tabs (Amlodipine besylate) .... One tab by mouth once daily 7)  Benazepril Hcl 20 Mg Tabs (Benazepril hcl) .... Take 1 tablet by mouth once a day 8)  Synthroid 100 Mcg Tabs (Levothyroxine sodium) .... One tab by mouth monday through friday and one half tab by mouth on saturday and sunday 9)  Clonidine Hcl 0.2 Mg Tabs (Clonidine hcl) .... Take two tablets three times daily 10)  One-a-day Extras Antioxidant Caps (Multiple vitamins-minerals) .... Take 1 tablet by mouth once a day 11)  Eql Fish Oil 1000 Mg Caps (Omega-3 fatty acids) .... Take 1 tablet by mouth two  times a day 12)  Lovastatin 40 Mg Tabs (Lovastatin) .... Take 1 tab by mouth at bedtime 13)  Oscal 500/200 D-3 500-200 Mg-unit Tabs (Calcium-vitamin d) .... Take 1 tablet by mouth three times a day 14)  Fluoxetine Hcl 10 Mg Tabs (Fluoxetine hcl) .... Take 1 tablet by mouth once a day 15)  Miralax Powd (Polyethylene glycol 3350) .Marland KitchenMarland Kitchen. 17 gm in 8 ounces water daily  Other Orders: Radiology Referral (Radiology)  Patient Instructions: 1)  F/u as before . 2)  pls start daily,mi ralax and colace, drink at least 8 glasses water daily, eat alot of fresh veg anfd fruit, and walk every day for at least 20 mins 3)  you will be referred to podiatry and for mamogram Prescriptions: LOVASTATIN 40 MG TABS (LOVASTATIN) Take 1 tab by mouth at bedtime  #30 Not Speci x 3   Entered by:   Everitt Amber LPN   Authorized by:   [name] MD   Signed by:   Everitt Amber LPN on 29/51/8841   Method used:   Printed then faxed to ...       [facility] [phone]* (retail)       [address] [place], [place]  [address]       Ph: [phone] or [phone]       Fax: [phone]    RxID:   [id number] HYDRALAZINE HCL 25 MG  TABS (HYDRALAZINE HCL) Two tabs by mouth three times a day  #180 Each x 3   Entered by:   Everitt Amber LPN   Authorized by:   [name] MD   Signed by:   Everitt Amber LPN on 54/62/7035   Method used:   Printed then faxed to ...       [facility] [phone]* (retail)       [address] [place], [place]  [address]       Ph: [phone] or [phone]       Fax: [phone]   RxID:   [id number] BENAZEPRIL HCL 20 MG TABS (BENAZEPRIL HCL) Take 1 tablet by mouth once a day  #30 x 3   Entered by:   Everitt Amber LPN   Authorized by:   [name] MD   Signed by:   Everitt Amber LPN on 61/95/0932   Method used:   Printed then faxed to ...       [facility] [phone]* (retail)       [address] [place], [place]  [address]       Ph: [phone] or [phone]       Fax: [phone]   RxID:   [id number] CLONIDINE HCL 0.2 MG  TABS (CLONIDINE HCL) take two tablets three times daily  #180 x 3   Entered by:   Everitt Amber LPN   Authorized by:   [name] MD   Signed by:   Everitt Amber LPN on 97/98/9211   Method used:   Printed then faxed to ...       [facility] [phone]* (retail)       [address] [place], [place]  [address]  Ph: [phone] or [phone]       Fax: [phone]   RxID:   [id number] ZOLPIDEM TARTRATE 10 MG  TABS (ZOLPIDEM TARTRATE) one tab by mouth at bedtime  #30 x 3   Entered by:   Everitt Amber LPN   Authorized by:   [name] MD   Signed by:   Everitt Amber LPN on 44/12/270   Method used:   Printed then faxed to ...       [facility] [phone]* (retail)       [address] [place], [place]  [address]       Ph: [phone] or [phone]       Fax: [phone]   RxID:   [id number] MIRALAX  POWD (POLYETHYLENE GLYCOL 3350) 17 gm in 8 ounces water  daily  #510 gm x 5   Entered and Authorized by:   [name] MD   Signed by:   [name] MD on 05/29/2010   Method used:   Electronically to        [facility] [phone]* (retail)       [address] [place], [place]  [address]       Ph: [phone] or [phone]       Fax: [phone]   RxID:   [id number]

## 2011-01-22 NOTE — Letter (Signed)
Summary: MISC  MISC   Imported By: Lind Guest 05/23/2010 11:51:42  _____________________________________________________________________  External Attachment:    Type:   Image     Comment:   External Document

## 2011-01-22 NOTE — Progress Notes (Signed)
Summary: appt  Phone Note Call from Patient   Summary of Call: appt. 2.29.11@ 1:00 patient aware Initial call taken by: Lind Guest,  October 26, 2010 2:06 PM  Follow-up for Phone Call        NOTED Follow-up by: [name] MD,  October 26, 2010 2:53 PM

## 2011-01-22 NOTE — Progress Notes (Signed)
Summary: medicine  Phone Note Call from Patient   Summary of Call: needs her thyroid med sent to [facility] in [facility] Initial call taken by: Lind Guest,  June 06, 2010 9:46 AM  Follow-up for Phone Call        Rx Called In Follow-up by: Adella Hare LPN,  June 06, 2010 9:53 AM    Prescriptions: SYNTHROID 100 MCG  TABS (LEVOTHYROXINE SODIUM) one tab by mouth monday through friday and one half tab by mouth on saturday and sunday  #30 x 5   Entered by:   Jaime Boothe LPN   Authorized by:   Margaret Simpson MD   Signed by:   Jaime Boothe LPN on 06/06/2010   Method used:   Electronically to        [id number]* (retail)       [address]       [place], [place]  [address]       Ph: [phone] or [phone]       Fax: [phone]   RxID:   [id number]

## 2011-01-22 NOTE — Assessment & Plan Note (Signed)
Summary: office visit   Vital Signs:  Patient profile:   65 year old female Menstrual status:  postmenopausal Height:      65.5 inches Weight:      242 pounds BMI:     39.80 O2 Sat:      98 % Pulse rate:   76 / minute Pulse rhythm:   regular Resp:     16 per minute BP sitting:   140 / 70  (right arm) Cuff size:   large  Vitals Entered By: Everitt Amber LPN (May 18, 2010 10:10 AM)  Nutrition Counseling: Patient's BMI is greater than 25 and therefore counseled on weight management options. CC: having some burning and pressure when urinating   CC:  having some burning and pressure when urinating.  History of Present Illness: 2 day h/o dysuria and frequency, acute onset, she deneies fever or chills, flank pain , nausea or vomitting. Pt notes incereased social withdrawal and anhidonia in the past 6 months. She had been living with her daughter but is back on her own, there is some strain in the replationship since she states thather son in law has a bad relationship with her. She is not suividal or homicidal and denies hallucinations. She ahs been overeating and has gained an excessive amt of weight.  Current Medications (verified): 1)  Hydralazine Hcl 25 Mg  Tabs (Hydralazine Hcl) .... Two Tabs By Mouth Three Times A Day 2)  Klor-Con M20 20 Meq  Tbcr (Potassium Chloride Crys Cr) .... Take 1 Tablet By Mouth Once A Day 3)  Furosemide 80 Mg  Tabs (Furosemide) .... One Tab By Mouth Once Daily 4)  Aspirin 81 Mg  Tbec (Aspirin) .... One Tab By Mouth Once Daily 5)  Zolpidem Tartrate 10 Mg  Tabs (Zolpidem Tartrate) .... One Tab By Mouth At Bedtime 6)  Amlodipine Besylate 10 Mg  Tabs (Amlodipine Besylate) .... One Tab By Mouth Once Daily 7)  Benazepril Hcl 20 Mg Tabs (Benazepril Hcl) .... Take 1 Tablet By Mouth Once A Day 8)  Synthroid 100 Mcg  Tabs (Levothyroxine Sodium) .... One Tab By Mouth Monday Through Friday and One Half Tab By Mouth On Saturday and Sunday 9)  Clonidine Hcl 0.2 Mg   Tabs (Clonidine Hcl) .... Take Two Tablets Three Times Daily 10)  One-A-Day Extras Antioxidant  Caps (Multiple Vitamins-Minerals) .... Take 1 Tablet By Mouth Once A Day 11)  Eql Fish Oil 1000 Mg Caps (Omega-3 Fatty Acids) .... Take 1 Tablet By Mouth Two Times A Day 12)  Lovastatin 40 Mg Tabs (Lovastatin) .... Take 1 Tab By Mouth At Bedtime 13)  Oscal 500/200 D-3 500-200 Mg-Unit Tabs (Calcium-Vitamin D) .... Take 1 Tablet By Mouth Three Times A Day  Allergies (verified): 1)  ! Morphine  Review of Systems      See HPI General:  Complains of fatigue and sleep disorder. Eyes:  Denies discharge and red eye. ENT:  Denies hoarseness, nasal congestion, postnasal drainage, sinus pressure, and sore throat. CV:  Denies chest pain or discomfort, difficulty breathing while lying down, palpitations, and swelling of feet. Resp:  Denies cough and sputum productive. GI:  Denies abdominal pain, change in bowel habits, constipation, diarrhea, nausea, and vomiting. GU:  Complains of dysuria and urinary frequency; 2 day history, with hematuria. MS:  Complains of joint pain, low back pain, mid back pain, and stiffness. Derm:  Denies itching, lesion(s), and rash. Neuro:  Denies brief paralysis, headaches, inability to speak, memory loss, poor balance, seizures,  and sensation of room spinning. Psych:  Complains of depression; denies mental problems, panic attacks, suicidal thoughts/plans, thoughts of violence, and unusual visions or sounds. Endo:  Denies cold intolerance, excessive hunger, excessive thirst, excessive urination, heat intolerance, polyuria, and weight change. Heme:  Denies abnormal bruising and bleeding. Allergy:  Denies hives or rash and itching eyes.  Physical Exam  General:  Well-developed,obese,in no acute distress; alert,appropriate and cooperative throughout examination HEENT: No facial asymmetry,  EOMI, No sinus tenderness, TM's Clear, oropharynx  pink and moist.   Chest: Clear to  auscultation bilaterally.  CVS: S1, S2, No murmurs, No S3.   Abd: Soft,obese, no organomegaly or masses, suprapubic tenderness. MS: Adequate ROM spine, hips, shoulders and knees.  Ext: No edema.   CNS: CN 2-12 intact, power tone and sensation normal throughout.   Skin: Intact, no visible lesions or rashes.  Psych: Good eye contact, normal affect.  Memory intact,  depressed appearing.    Impression & Recommendations:  Problem # 1:  DEPRESSION (ICD-311) Assessment Comment Only  Her updated medication list for this problem includes:    Fluoxetine Hcl 10 Mg Tabs (Fluoxetine hcl) .Marland Kitchen... Take 1 tablet by mouth once a day, new dx will start meds and call 51f adverse effects  Problem # 2:  FATIGUE (ICD-780.79) Assessment: Deteriorated  Orders: T-TSH ([address])  Problem # 3:  ACUTE CYSTITIS (ICD-595.0) Assessment: Comment Only  The following medications were removed from the medication list:    Metronidazole 500 Mg Tabs (Metronidazole) ..... One tab by mouth bid    Ciprofloxacin Hcl 500 Mg Tabs (Ciprofloxacin hcl) ..... One tab by mouth bid Her updated medication list for this problem includes:    Ciprofloxacin Hcl 500 Mg Tabs (Ciprofloxacin hcl) .Marland Kitchen... Take 1 tablet by mouth two times a day  Orders: UA Dipstick W/ Micro (manual) (09811) T-Culture, Urine ([id number])  Encouraged to push clear liquids, get enough rest, and take acetaminophen as needed. To be seen in 10 days if no improvement, sooner if worse.  Problem # 4:  UNSPECIFIED HYPOTHYROIDISM (ICD-244.9) Assessment: Comment Only  Her updated medication list for this problem includes:    Synthroid 100 Mcg Tabs (Levothyroxine sodium) ..... One tab by mouth monday through friday and one half tab by mouth on saturday and sunday  Labs Reviewed: TSH: 0.728 (04/04/2010)    Chol: 139 (04/04/2010)   HDL: 36 (04/04/2010)   LDL: 71 (04/04/2010)   TG: 160 (04/04/2010)  Problem # 5:  OBESITY, UNSPECIFIED (ICD-278.00) Assessment:  Deteriorated  Ht: 65.5 (05/18/2010)   Wt: 242 (05/18/2010)   BMI: 39.80 (05/18/2010)  Problem # 6:  HYPERTENSION (ICD-401.9) Assessment: Improved  Her updated medication list for this problem includes:    Hydralazine Hcl 25 Mg Tabs (Hydralazine hcl) .Marland Kitchen..Marland Kitchen Two tabs by mouth three times a day    Furosemide 80 Mg Tabs (Furosemide) ..... One tab by mouth once daily    Amlodipine Besylate 10 Mg Tabs (Amlodipine besylate) ..... One tab by mouth once daily    Benazepril Hcl 20 Mg Tabs (Benazepril hcl) .Marland Kitchen... Take 1 tablet by mouth once a day    Clonidine Hcl 0.2 Mg Tabs (Clonidine hcl) .Marland Kitchen... Take two tablets three times daily  BP today: 140/70 Prior BP: 152/76 (10/24/2009)  Labs Reviewed: K+: 4.1 (04/04/2010) Creat: : 1.14 (04/04/2010)   Chol: 139 (04/04/2010)   HDL: 36 (04/04/2010)   LDL: 71 (04/04/2010)   TG: 160 (04/04/2010)  Complete Medication List: 1)  Hydralazine Hcl 25 Mg Tabs (Hydralazine hcl) .Marland KitchenMarland KitchenMarland Kitchen  Two tabs by mouth three times a day 2)  Klor-con M20 20 Meq Tbcr (Potassium chloride crys cr) .... Take 1 tablet by mouth once a day 3)  Furosemide 80 Mg Tabs (Furosemide) .... One tab by mouth once daily 4)  Aspirin 81 Mg Tbec (Aspirin) .... One tab by mouth once daily 5)  Zolpidem Tartrate 10 Mg Tabs (Zolpidem tartrate) .... One tab by mouth at bedtime 6)  Amlodipine Besylate 10 Mg Tabs (Amlodipine besylate) .... One tab by mouth once daily 7)  Benazepril Hcl 20 Mg Tabs (Benazepril hcl) .... Take 1 tablet by mouth once a day 8)  Synthroid 100 Mcg Tabs (Levothyroxine sodium) .... One tab by mouth monday through friday and one half tab by mouth on saturday and sunday 9)  Clonidine Hcl 0.2 Mg Tabs (Clonidine hcl) .... Take two tablets three times daily 10)  One-a-day Extras Antioxidant Caps (Multiple vitamins-minerals) .... Take 1 tablet by mouth once a day 11)  Eql Fish Oil 1000 Mg Caps (Omega-3 fatty acids) .... Take 1 tablet by mouth two times a day 12)  Lovastatin 40 Mg Tabs  (Lovastatin) .... Take 1 tab by mouth at bedtime 13)  Oscal 500/200 D-3 500-200 Mg-unit Tabs (Calcium-vitamin d) .... Take 1 tablet by mouth three times a day 14)  Fluoxetine Hcl 10 Mg Tabs (Fluoxetine hcl) .... Take 1 tablet by mouth once a day 15)  Ciprofloxacin Hcl 500 Mg Tabs (Ciprofloxacin hcl) .... Take 1 tablet by mouth two times a day  Patient Instructions: 1)  Please schedule a follow-up appointment in 2 months. 2)  It is important that you exercise regularly at least 20 minutes 5 times a week. If you develop chest pain, have severe difficulty breathing, or feel very tired , stop exercising immediately and seek medical attention. 3)  You need to lose weight. Consider a lower calorie diet and regular exercise.  4)  you are being treated for a urinary tract infection, drink alot water pls. 5)  You are being treated fo depression, pls in the next 2 weeks, start wLKING EVERY DAY AND PLAN TO GET OUT OF THE HOME AT LEAST TWICE PER WEEK. Prescriptions: CIPROFLOXACIN HCL 500 MG TABS (CIPROFLOXACIN HCL) Take 1 tablet by mouth two times a day  #14 x 0   Entered and Authorized by:     MD   Signed by:     MD on 05/18/2010   Method used:   Electronically to        [id number]* (retail)       [address]       [place]       [place], [place]  [address]       Ph: [phone] or [phone]       Fax: [phone]   RxID:   [id number] FLUOXETINE HCL 10 MG TABS (FLUOXETINE HCL) Take 1 tablet by mouth once a day  #30 x 2   Entered and Authorized by:     MD   Signed by:     MD on 05/18/2010   Method used:   Electronically to        [id number]* (retail)       [address]       [place], [place]  [address]       Ph: [phone] or [phone]       Fax: [phone]   RxID:  [id number]   Laboratory Results   Urine Tests    Routine Urinalysis   Color:  straw Glucose: negative   (Normal Range: Negative) Bilirubin: negative   (Normal Range: Negative) Ketone: negative   (Normal Range: Negative) Spec. Gravity: <1.005   (Normal Range: 1.003-1.035) Blood: large   (Normal Range: Negative) pH: 5.5   (Normal Range: 5.0-8.0) Protein: 100   (Normal Range: Negative) Urobilinogen: 0.2   (Normal Range: 0-1) Nitrite: negative   (Normal Range: Negative) Leukocyte Esterace: moderate   (Normal Range: Negative)

## 2011-01-22 NOTE — Letter (Signed)
Summary: history and physical  history and physical   Imported By: Lind Guest 05/23/2010 10:43:28  _____________________________________________________________________  External Attachment:    Type:   Image     Comment:   External Document

## 2011-01-22 NOTE — Progress Notes (Signed)
  Phone Note Call from Patient   Caller: Patient Summary of Call: patient states amlodipine went up to $15-18, at [facility] and [facility],  is there somthing cheaper Initial call taken by: Adella Hare LPN,  July 16, 2010 9:52 AM  Follow-up for Phone Call        pls call either of thewse and see if still $4 without insurance if so she needs to let the pharmacist know she does not want to use her ins card to pay for it  Follow-up by: [name] MD,  July 16, 2010 12:08 PM  Additional Follow-up for Phone Call Additional follow up Details #1::        called [facility] it is only $15 one time to enroll in the savings club there and will be $5 every month for the next year  called patient, no answer x2 Additional Follow-up by: Adella Hare LPN,  July 16, 2010 12:14 PM    Additional Follow-up for Phone Call Additional follow up Details #2::    called patient, no answer Follow-up by: Adella Hare LPN,  July 17, 2010 10:07 AM

## 2011-01-22 NOTE — Progress Notes (Signed)
Summary: refill  Phone Note Call from Patient   Summary of Call: [facility] should faxed over meds. she is out. please refill  amlodipine 10mg  [phone] Initial call taken by: Rudene Anda,  February 14, 2010 9:34 AM  Follow-up for Phone Call        Rx Called In Follow-up by: Adella Hare LPN,  February 14, 2010 9:58 AM    Prescriptions: AMLODIPINE BESYLATE 10 MG  TABS (AMLODIPINE BESYLATE) one tab by mouth once daily  #30 x 3   Entered by:   Adella Hare LPN   Authorized by:   [name] MD   Signed by:   Adella Hare LPN on 45/40/9811   Method used:   Electronically to        [facility] [phone]* (retail)       [address] [place], [place]  [address]       Ph: [phone] or [phone]       Fax: [phone]   RxID:   [id number]

## 2011-01-22 NOTE — Progress Notes (Signed)
Summary: medication question  Phone Note Call from Patient   Summary of Call: patient wanted to know if you were going to call her in some calcium and Vit. D, she thought you were going to at her last visit.  you can call her at [phone] or [phone] Initial call taken by: Curtis Sites,  November 06, 2010 2:47 PM  Follow-up for Phone Call        i have sent in script to pharmacy, pls let her know Follow-up by: [name] MD,  November 07, 2010 6:24 AM  Additional Follow-up for Phone Call Additional follow up Details #1::        Called patient on both phone numbers given, no answer Additional Follow-up by: Mauricia Area CMA,  November 07, 2010 7:54 AM    Additional Follow-up for Phone Call Additional follow up Details #2::    Called patient on both numbers provided, no answer  Follow-up by: Mauricia Area CMA,  November 07, 2010 1:40 PM  Additional Follow-up for Phone Call Additional follow up Details #3:: Details for Additional Follow-up Action Taken: Patient aware Additional Follow-up by: Mauricia Area CMA,  November 07, 2010 1:57 PM  New/Updated Medications: OSCAL 500/200 D-3 500-200 MG-UNIT TABS (CALCIUM CARBONATE-VITAMIN D) Take 1 tablet by mouth three times a day Prescriptions: OSCAL 500/200 D-3 500-200 MG-UNIT TABS (CALCIUM CARBONATE-VITAMIN D) Take 1 tablet by mouth three times a day  #90 x 11   Entered and Authorized by:   [name] MD   Signed by:   [name] MD on 11/07/2010   Method used:   Electronically to        [facility] [phone]* (retail)       [address] [place], [place]  [address]       Ph: [phone] or [phone]       Fax: [phone]   RxID:   [id number]

## 2011-01-22 NOTE — Letter (Signed)
Summary: labs  labs   Imported By: Lind Guest 05/23/2010 10:43:56  _____________________________________________________________________  External Attachment:    Type:   Image     Comment:   External Document

## 2011-01-22 NOTE — Letter (Signed)
Summary: X RAYS  X RAYS   Imported By: Lind Guest 05/23/2010 11:21:44  _____________________________________________________________________  External Attachment:    Type:   Image     Comment:   External Document

## 2011-01-22 NOTE — Letter (Signed)
Summary: phone notes  phone notes   Imported By: Lind Guest 05/23/2010 11:06:26  _____________________________________________________________________  External Attachment:    Type:   Image     Comment:   External Document

## 2011-01-22 NOTE — Assessment & Plan Note (Signed)
Summary: office visit   Vital Signs:  Patient profile:   65 year old female Menstrual status:  postmenopausal Height:      65.5 inches Weight:      230 pounds BMI:     37.83 O2 Sat:      97 % on Room air Pulse rate:   77 / minute Pulse rhythm:   regular Resp:     16 per minute BP sitting:   152 / 80  (left arm)  Vitals Entered By: Mauricia Area CMA (October 17, 2010 1:06 PM)  Nutrition Counseling: Patient's BMI is greater than 25 and therefore counseled on weight management options.  O2 Flow:  Room air CC: follow up   CC:  follow up.  History of Present Illness: Reports  that she is doing better. she is once again living with herdaughter , and is involved with caring for her grandchild, which makes her very happy. Denies recent fever or chills. Denies sinus pressure, nasal congestion , ear pain or sore throat. Denies chest congestion, or cough productive of sputum. Denies chest pain, palpitations, PND, orthopnea or leg swelling. Denies abdominal pain, nausea, vomitting, diarrhea or constipation. Denies change in bowel movements or bloody stool. Denies dysuria , frequency, incontinence or hesitancy.  Denies headaches, vertigo, seizures. Denies uncontrolled  depression, anxiety or insomnia.      Current Medications (verified): 1)  Hydralazine Hcl 25 Mg  Tabs (Hydralazine Hcl) .... Two Tabs By Mouth Three Times A Day 2)  Aspirin 81 Mg  Tbec (Aspirin) .... One Tab By Mouth Once Daily 3)  Zolpidem Tartrate 10 Mg  Tabs (Zolpidem Tartrate) .... One Tab By Mouth At Bedtime 4)  Amlodipine Besylate 10 Mg  Tabs (Amlodipine Besylate) .... One Tab By Mouth Once Daily 5)  Benazepril Hcl 20 Mg Tabs (Benazepril Hcl) .... Take 1 Tablet By Mouth Once A Day 6)  Synthroid 100 Mcg  Tabs (Levothyroxine Sodium) .... One Tab By Mouth Monday Through Friday and One Half Tab By Mouth On Saturday and Sunday 7)  Clonidine Hcl 0.2 Mg  Tabs (Clonidine Hcl) .... Take Two Tablets Three Times  Daily 8)  One-A-Day Extras Antioxidant  Caps (Multiple Vitamins-Minerals) .... Take 1 Tablet By Mouth Once A Day 9)  Eql Fish Oil 1000 Mg Caps (Omega-3 Fatty Acids) .... Take 1 Tablet By Mouth Two Times A Day 10)  Lovastatin 40 Mg Tabs (Lovastatin) .... Take 1 Tab By Mouth At Bedtime 11)  Fluoxetine Hcl 10 Mg Tabs (Fluoxetine Hcl) .... Take 1 Tablet By Mouth Once A Day 12)  Gnp Stool Softener 100 Mg Caps (Docusate Sodium) .... As Needed  Allergies (verified): 1)  ! Morphine  Review of Systems      See HPI General:  Complains of fatigue and sleep disorder. ENT:  Complains of postnasal drainage; clear x 1 month with intermittent headaches at different areas. MS:  Complains of joint pain and stiffness. Derm:  Complains of rash; red rash on anterior chest  x 2 months. Neuro:  Complains of tingling; quivering sensation in left buttock and thigh twice per week forthe last 8 weeks, duration. Endo:  Denies cold intolerance, excessive thirst, excessive urination, and heat intolerance. Heme:  Denies abnormal bruising and bleeding. Allergy:  Complains of seasonal allergies; denies hives or rash.  Physical Exam  General:  Well-developed,obese,in no acute distress; alert,appropriate and cooperative throughout examination HEENT: No facial asymmetry,  EOMI, No sinus tenderness, TM's Clear, oropharynx  pink and moist.   Chest: Clear  to auscultation bilaterally.  CVS: S1, S2, No murmurs, No S3.   Abd: Soft,obese, no organomegaly or masses, suprapubic tenderness. MS: Adequate ROM spine, hips, shoulders and knees.  Ext: No edema.   CNS: CN 2-12 intact, power tone and sensation normal throughout.   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  Memory intact,not anxious or    depressed appearing.    Impression & Recommendations:  Problem # 1:  SKIN RASH (ICD-782.1) Assessment Deteriorated  Orders: Dermatology Referral  (Derma)  Problem # 2:  DEPRESSION (ICD-311) Assessment: Improved  Her updated medication list for this problem includes:    Fluoxetine Hcl 10 Mg Tabs (Fluoxetine hcl) .Marland Kitchen... Take 1 tablet by mouth once a day pt advised to take med daily as prescribed for bewtter response  Problem # 3:  OBESITY, UNSPECIFIED (ICD-278.00) Assessment: Improved  Ht: 65.5 (10/17/2010)   Wt: 230 (10/17/2010)   BMI: 37.83 (10/17/2010) therapeutic lifestyle change discussed and encouraged  Problem # 4:  HYPERTENSION (ICD-401.9) Assessment: Deteriorated  Her updated medication list for this problem includes:    Hydralazine Hcl 25 Mg Tabs (Hydralazine hcl) .Marland Kitchen..Marland Kitchen Two tabs by mouth three times a day    Amlodipine Besylate 10 Mg Tabs (Amlodipine besylate) ..... One tab by mouth once daily    Benazepril Hcl 20 Mg Tabs (Benazepril hcl) .Marland Kitchen... Take 1 tablet by mouth once a day    Clonidine Hcl 0.2 Mg Tabs (Clonidine hcl) .Marland Kitchen... Take two tablets three times daily  Orders: T-Basic Metabolic Panel [phone]) Patient advised to follow low sodium diet rich in fruit and vegetables, and to commit to at least 30 minutes 5 days per week of regular exercise , to improve blood presure control.   BP today: 152/80 Prior BP: 140/78 (07/18/2010)  Labs Reviewed: K+: 4.1 (04/04/2010) Creat: : 1.14 (04/04/2010)   Chol: 139 (04/04/2010)   HDL: 36 (04/04/2010)   LDL: 71 (04/04/2010)   TG: 160 (04/04/2010)  Problem # 5:  UNSPECIFIED HYPOTHYROIDISM (ICD-244.9) Assessment: Comment Only  Her updated medication list for this problem includes:    Synthroid 100 Mcg Tabs (Levothyroxine sodium) ..... One tab by mouth monday through friday and one half tab by mouth on saturday and sunday  Orders: T-TSH [id number])  Labs Reviewed: TSH: 1.458 (06/29/2010)    Chol: 139 (04/04/2010)   HDL: 36 (04/04/2010)   LDL: 71 (04/04/2010)   TG: 160 (04/04/2010)  Complete Medication List: 1)  Hydralazine Hcl 25 Mg Tabs (Hydralazine hcl) ....  Two tabs by mouth three times a day 2)  Aspirin 81 Mg Tbec (Aspirin) .... One tab by mouth once daily 3)  Zolpidem Tartrate 10 Mg Tabs (Zolpidem tartrate) .... One tab by mouth at bedtime 4)  Amlodipine Besylate 10 Mg Tabs (Amlodipine besylate) .... One tab by mouth once daily 5)  Benazepril Hcl 20 Mg Tabs (Benazepril hcl) .... Take 1 tablet by mouth once a day 6)  Synthroid 100 Mcg Tabs (Levothyroxine sodium) .... One tab by mouth monday through friday and one half tab by mouth on saturday and sunday 7)  Clonidine Hcl 0.2 Mg Tabs (Clonidine hcl) .... Take two tablets three times daily 8)  One-a-day Extras Antioxidant Caps (Multiple vitamins-minerals) .... Take 1 tablet by mouth once a day 9)  Eql Fish Oil 1000 Mg Caps (Omega-3 fatty acids) .... Take 1 tablet by mouth two times a day 10)  Lovastatin 40 Mg Tabs (Lovastatin) .... Take 1 tab by mouth at bedtime 11)  Fluoxetine  Hcl 10 Mg Tabs (Fluoxetine hcl) .... Take 1 tablet by mouth once a day 12)  Gnp Stool Softener 100 Mg Caps (Docusate sodium) .... As needed 13)  Gnp Motion Sickness Relief 25 Mg Tabs (Meclizine hcl) .... One daily as needed for vertigo 14)  Calcium /vitd 1200mg /1000iu  .... One capsule once daily  Other Orders: T-Hepatic Function [id number]) T-Lipid Profile [id number]) Influenza Vaccine NON MCR (30865)  Patient Instructions: 1)  Please schedule a follow-up appointment in 6 months. 2)  It is important that you exercise regularly at least 20 minutes 5 times a week. If you develop chest pain, have severe difficulty breathing, or feel very tired , stop exercising immediately and seek medical attention. 3)  You need to lose weight. Consider a lower calorie diet and regular exercise.  4)  BMP prior to visit, ICD-9: 5)  Hepatic Panel prior to visit, ICD-9: 6)  Lipid Panel prior to visit, ICD-9:  fasting asap 7)  TSH prior to visit, ICD-9: 8)  You are being referred to a dermatologist Prescriptions: FLUOXETINE HCL 10 MG  TABS (FLUOXETINE HCL) Take 1 tablet by mouth once a day  #30 Capsule x 3   Entered by:   Mauricia Area CMA   Authorized by:   [name] MD   Signed by:   Mauricia Area CMA on 10/17/2010   Method used:   Electronically to        [facility] [phone]* (retail)       [address] [place], [place]  [address]       Ph: [phone] or [phone]       Fax: [phone]   RxID:   [id number] BENAZEPRIL HCL 20 MG TABS (BENAZEPRIL HCL) Take 1 tablet by mouth once a day  #30 x 3   Entered by:   Mauricia Area CMA   Authorized by:   [name] MD   Signed by:   Mauricia Area CMA on 10/17/2010   Method used:   Electronically to        [facility] [phone]* (retail)       [address] [place], [place]  [address]       Ph: [phone] or [phone]       Fax: [phone]   RxID:   [id number]    Orders Added: 1)  Est. Patient Level IV [54627] 2)  T-Basic Metabolic Panel [[id number]] 3)  T-Hepatic Function [80076-22960] 4)  T-Lipid Profile [80061-22930] 5)  T-TSH [[id number]] 6)  Influenza Vaccine NON MCR [00028] 7)  Dermatology Referral [Derma]   Immunizations Administered:  Influenza Vaccine:    Vaccine Type: Fluvax Non-MCR    Site: right deltoid    Mfr: novartis    Dose: 0.5 ml    Route: IM    Given by: Mauricia Area CMA    Exp. Date: 04/2011    Lot #: [id number]    VIS given: 07/17/10 version given October 17, 2010.   Immunizations Administered:  Influenza Vaccine:    Vaccine Type: Fluvax Non-MCR    Site: right deltoid    Mfr: novartis    Dose: 0.5 ml    Route: IM    Given by: Mauricia Area CMA    Exp. Date: 04/2011    Lot #: [id number]    VIS given: 07/17/10  version given October 17, 2010.

## 2011-01-22 NOTE — Letter (Signed)
Summary: demo  demo   Imported By: Lind Guest 05/23/2010 10:42:51  _____________________________________________________________________  External Attachment:    Type:   Image     Comment:   External Document

## 2011-01-22 NOTE — Progress Notes (Signed)
Summary: refill  Phone Note Call from Patient   Summary of Call: pt states drug store has been faxing our office on her med. and it has been two wks and still no med. [phone] cell [phone] Initial call taken by: Rudene Anda,  January 15, 2010 9:36 AM  Follow-up for Phone Call        returned call, no answer... both phone numbers Follow-up by: Worthy Keeler LPN,  January 15, 2010 10:37 AM  Additional Follow-up for Phone Call Additional follow up Details #1::        pols call the drugstore since you cannot get the pt and refill as appropraite Additional Follow-up by: [name] MD,  January 15, 2010 12:46 PM    Additional Follow-up for Phone Call Additional follow up Details #2::    sent rx, patient aware Follow-up by: Worthy Keeler LPN,  January 15, 2010 1:39 PM  Prescriptions: HYDRALAZINE HCL 25 MG  TABS (HYDRALAZINE HCL) Two tabs by mouth three times a day  #180 x 3   Entered by:   Worthy Keeler LPN   Authorized by:   [name] MD   Signed by:   Worthy Keeler LPN on 12/31/3233   Method used:   Electronically to        [facility] [phone]* (retail)       [address] [place], [place]  [address]       Ph: [phone] or [phone]       Fax: [phone]   RxID:   [id number]

## 2011-01-22 NOTE — Progress Notes (Signed)
Summary: refill  Phone Note Call from Patient   Summary of Call: pt needs to get clodine 0.2mg  [phone] Initial call taken by: Rudene Anda,  May 08, 2010 11:56 AM  Follow-up for Phone Call        Rx Called In Follow-up by: Adella Hare LPN,  May 08, 2010 2:17 PM    Prescriptions: CLONIDINE HCL 0.2 MG  TABS (CLONIDINE HCL) take two tablets three times daily  #180 x 0   Entered by:   Adella Hare LPN   Authorized by:   [name] MD   Signed by:   Adella Hare LPN on 45/40/9811   Method used:   Electronically to        [facility] [phone]* (retail)       [address] [place], [place]  [address]       Ph: [phone] or [phone]       Fax: [phone]   RxID:   [id number]

## 2011-01-22 NOTE — Letter (Signed)
Summary: [facility]   Imported By: Lind Guest 03/20/2010 10:17:33  _____________________________________________________________________  External Attachment:    Type:   Image     Comment:   External Document

## 2011-01-22 NOTE — Letter (Signed)
Summary: OFFICE NOTES  OFFICE NOTES   Imported By: Lind Guest 05/23/2010 11:44:53  _____________________________________________________________________  External Attachment:    Type:   Image     Comment:   External Document

## 2011-01-22 NOTE — Progress Notes (Signed)
Summary: MEDICINE  Phone Note Call from Patient   Summary of Call: ON HER VISIT IN OCTOBER MEDICINE WAS SENT TO [facility] AND THEY NEVER RECIEVED IT SO SHE WOULD LIKE FOR IT TO GO TO [facility] Initial call taken by: Lind Guest,  November 12, 2010 1:39 PM  Follow-up for Phone Call        patient states she has called the pharmacy [facility] in [place] and her prescription for Vit D and calcium still isn't there, she asked me to see if I saw any notes of where she has called about this twice, I informed her yes I did and that on the 14 or 15th phone note states it had been sent into the pharmacy.  Please advise. Follow-up by: Curtis Sites,  November 13, 2010 9:24 AM  Additional Follow-up for Phone Call Additional follow up Details #1::        pls call the pharmacy and verify that the electronic script had been sent, ask that they fill it and notify the pt, sometimes if the pt does not collect within a few days it is re-shelved. plslet her know when completed Additional Follow-up by: [name] MD,  November 13, 2010 12:59 PM    Additional Follow-up for Phone Call Additional follow up Details #2::    called [facility] and verified that they did have the prescriptions for Mrs. Ong, I called Mrs. Leoni and she is aware that the medication is ready for pick up at [facility]   Follow-up by: Curtis Sites,  November 13, 2010 2:22 PM

## 2011-01-24 NOTE — Progress Notes (Signed)
Summary: hydralazine hcl  Phone Note Call from Patient   Summary of Call: patient needs her hydralazine HCL called into [facility] in [place], she states she has called them and they were suppose to fax over request.  Initial call taken by: Curtis Sites,  December 12, 2010 11:37 AM    Prescriptions: HYDRALAZINE HCL 25 MG  TABS (HYDRALAZINE HCL) Two tabs by mouth three times a day  #180 Each x 2   Entered by:   Adella Hare LPN   Authorized by:   [name] MD   Signed by:   Adella Hare LPN on 16/09/9603   Method used:   Electronically to        [facility]  [address] (retail)       [address]       [place], [place]  [address]       Ph: [phone]       Fax: [phone]   RxID:   [id number]

## 2011-01-24 NOTE — Assessment & Plan Note (Signed)
Summary: ov   Vital Signs:  Patient profile:   65 year old female Menstrual status:  postmenopausal Height:      65.5 inches Weight:      232.25 pounds BMI:     38.20 O2 Sat:      99 % on Room air Pulse rate:   73 / minute Pulse rhythm:   regular Resp:     16 per minute BP sitting:   182 / 90  (left arm)  Vitals Entered By: Adella Hare LPN (December 04, 2010 10:28 AM)  Nutrition Counseling: Patient's BMI is greater than 25 and therefore counseled on weight management options.  O2 Flow:  Room air CC: sick x 5 days- chest congestion, was green but starting to clear, muscle aches Is Patient Diabetic? No Comments did not bring meds to ov   CC:  sick x 5 days- chest congestion, was green but starting to clear, and muscle aches.  History of Present Illness: 6 day h/o scratchy throat , cough chest ongestion, green sputum chills, but  no documented fever . Has used oTC  meds wil little improvement  Allergies: 1)  ! Morphine  Review of Systems      See HPI General:  Complains of chills and fatigue. Eyes:  Denies discharge and eye pain. CV:  Denies chest pain or discomfort, palpitations, and swelling of feet. Resp:  Complains of cough and sputum productive. GI:  Denies abdominal pain, constipation, diarrhea, nausea, and vomiting. GU:  Denies dysuria and urinary frequency. MS:  Complains of joint pain and stiffness. Derm:  Denies itching and rash. Neuro:  Complains of headaches; denies seizures and sensation of room spinning. Endo:  Denies cold intolerance, excessive hunger, excessive thirst, excessive urination, and heat intolerance. Heme:  Denies abnormal bruising and bleeding. Allergy:  Complains of seasonal allergies; denies hives or rash and itching eyes.  Physical Exam  General:  Well-developed,well-nourished,in no acute distress; alert,appropriate and cooperative throughout examination HEENT: No facial asymmetry,  EOMI, No sinus tenderness, left ear impacted,  oropharynx  pink and moist.   Chest: decreased air entry, bilateral crackles CVS: S1, S2, No murmurs, No S3.   Abd: Soft, Nontender.  MS: Adequate ROM spine, hips, shoulders and knees.  Ext: No edema.   CNS: CN 2-12 intact, power tone and sensation normal throughout.   Skin: Intact, no visible lesions or rashes.  Psych: Good eye contact, normal affect.  Memory intact, not anxious or depressed appearing.    Impression & Recommendations:  Problem # 1:  ACUTE BRONCHITIS (ICD-466.0) Assessment Comment Only  The following medications were removed from the medication list:    Tessalon Perles 100 Mg Caps (Benzonatate) .Marland Kitchen... Take 1 capsule by mouth three times a day Her updated medication list for this problem includes:    Penicillin V Potassium 500 Mg Tabs (Penicillin v potassium) .Marland Kitchen... Take 1 tablet by mouth three times a day  Problem # 2:  HEARING LOSS (ICD-389.9) Assessment: Comment Only left ear impaction, to return for a flush  Problem # 3:  HYPERTENSION (ICD-401.9) Assessment: Deteriorated  Her updated medication list for this problem includes:    Hydralazine Hcl 25 Mg Tabs (Hydralazine hcl) .Marland Kitchen..Marland Kitchen Two tabs by mouth three times a day    Amlodipine Besylate 10 Mg Tabs (Amlodipine besylate) ..... One tab by mouth once daily    Benazepril Hcl 20 Mg Tabs (Benazepril hcl) .Marland Kitchen... Take 1 tablet by mouth once a day    Clonidine Hcl 0.2 Mg Tabs (Clonidine  hcl) ..... Take two tablets three times daily  BP today: 182/90 Prior BP: 152/80 (10/17/2010) Has not taken med today Labs Reviewed: K+: 3.7 (11/20/2010) Creat: : 1.09 (11/20/2010)   Chol: 143 (11/20/2010)   HDL: 37 (11/20/2010)   LDL: 74 (11/20/2010)   TG: 158 (11/20/2010)  Problem # 4:  UNSPECIFIED HYPOTHYROIDISM (ICD-244.9) Assessment: Unchanged  Her updated medication list for this problem includes:    Synthroid 100 Mcg Tabs (Levothyroxine sodium) ..... One tab by mouth monday through friday and one half tab by mouth on  saturday and sunday  Labs Reviewed: TSH: 0.875 (11/20/2010)    Chol: 143 (11/20/2010)   HDL: 37 (11/20/2010)   LDL: 74 (11/20/2010)   TG: 158 (11/20/2010)  Complete Medication List: 1)  Hydralazine Hcl 25 Mg Tabs (Hydralazine hcl) .... Two tabs by mouth three times a day 2)  Aspirin 81 Mg Tbec (Aspirin) .... One tab by mouth once daily 3)  Zolpidem Tartrate 10 Mg Tabs (Zolpidem tartrate) .... One tab by mouth at bedtime 4)  Amlodipine Besylate 10 Mg Tabs (Amlodipine besylate) .... One tab by mouth once daily 5)  Benazepril Hcl 20 Mg Tabs (Benazepril hcl) .... Take 1 tablet by mouth once a day 6)  Synthroid 100 Mcg Tabs (Levothyroxine sodium) .... One tab by mouth monday through friday and one half tab by mouth on saturday and sunday 7)  Clonidine Hcl 0.2 Mg Tabs (Clonidine hcl) .... Take two tablets three times daily 8)  One-a-day Extras Antioxidant Caps (Multiple vitamins-minerals) .... Take 1 tablet by mouth once a day 9)  Eql Fish Oil 1000 Mg Caps (Omega-3 fatty acids) .... Take 1 tablet by mouth two times a day 10)  Lovastatin 40 Mg Tabs (Lovastatin) .... Take 1 tab by mouth at bedtime 11)  Fluoxetine Hcl 10 Mg Tabs (Fluoxetine hcl) .... Take 1 tablet by mouth once a day 12)  Gnp Stool Softener 100 Mg Caps (Docusate sodium) .... As needed 13)  Gnp Motion Sickness Relief 25 Mg Tabs (Meclizine hcl) .... One daily as needed for vertigo 14)  Oscal 500/200 D-3 500-200 Mg-unit Tabs (Calcium carbonate-vitamin d) .... Take 1 tablet by mouth three times a day 15)  Penicillin V Potassium 500 Mg Tabs (Penicillin v potassium) .... Take 1 tablet by mouth three times a day 16)  Delsyn  .... One teaspoon 3 times daily  Patient Instructions: 1)  f/u as before. 2)  Penicillin is sent to your pharmacy, 3)  you will get delsyn , a decongestant. 4)  you do not need to fill the tessalon perles sent in yesterday 5)  use wax softener as directed and sched a mid Jan appt for left ear  flush Prescriptions: PENICILLIN V POTASSIUM 500 MG TABS (PENICILLIN V POTASSIUM) Take 1 tablet by mouth three times a day  #30 x 0   Entered and Authorized by:   Margaret Simpson MD   Signed by:   Margaret Simpson MD on 12/04/2010   Method used:   Electronically to        [id number]* (retail)       [address]       [place], [place]  [address]       Ph: [phone] or [phone]       Fax: [phone]   RxID:   [id number]    Orders Added: 1)  Est. Patient Level IV [59563]

## 2011-01-24 NOTE — Progress Notes (Signed)
Summary: sick  Phone Note Call from Patient   Summary of Call: patient called in states that she has a bad cold congestion sore throat, coughing patient states she can not sleep was wondreing could you called something in for her because do not have any opens today  [facility] [place] CALL BACK AT [phone] Initial call taken by: Eugenio Hoes,  December 03, 2010 8:11 AM  Follow-up for Phone Call        will send tessalon perles , advise fluids , rest, tylenol vit c, if fever chills or discolored sptuum need ov, pls let her know Follow-up by: [name] MD,  December 03, 2010 1:07 PM  Additional Follow-up for Phone Call Additional follow up Details #1::        states she has green sputum, will schedule appt for tomorrow Additional Follow-up by: Adella Hare LPN,  December 03, 2010 4:55 PM    New/Updated Medications: TESSALON PERLES 100 MG CAPS (BENZONATATE) Take 1 capsule by mouth three times a day Prescriptions: TESSALON PERLES 100 MG CAPS (BENZONATATE) Take 1 capsule by mouth three times a day  #30 x 0   Entered and Authorized by:   [name] MD   Signed by:   [name] MD on 12/03/2010   Method used:   Electronically to        [facility] [phone]* (retail)       [address] [place], [place]  [address]       Ph: [phone] or [phone]       Fax: [phone]   RxID:   [id number]

## 2011-01-29 ENCOUNTER — Telehealth: Payer: Self-pay | Admitting: Family Medicine

## 2011-01-30 NOTE — Progress Notes (Signed)
Summary: medicine  Phone Note Call from Patient   Summary of Call: pt needs to get mezline. [facility] in [place] [phone] WANTS 90 DAY SUPPLY Initial call taken by: Rudene Anda,  January 18, 2011 9:21 AM  Follow-up for Phone Call        can she have rx for this. The one on her med list is one daily as needed but it was from last summer.  Additional Follow-up for Phone Call Additional follow up Details #1::        pls call back and document current symptoms,and duration, then send in historically entered script Additional Follow-up by: [name] MD,  January 18, 2011 12:26 PM    Additional Follow-up for Phone Call Additional follow up Details #2::    CALL BACK AND LET HER KNOW ABOUT THIS IF NOT HOME CALL CELL AT [phone] Follow-up by: Lind Guest,  January 21, 2011 9:47 AM  Additional Follow-up for Phone Call Additional follow up Details #3:: Details for Additional Follow-up Action Taken: med was sent this morning for her vertigo Additional Follow-up by: Adella Hare LPN,  January 21, 2011 3:11 PM  New/Updated Medications: MECLIZINE HCL 25 MG TABS (MECLIZINE HCL) Take 1 tablet by mouth three times a day as needed for dizziness Prescriptions: MECLIZINE HCL 25 MG TABS (MECLIZINE HCL) Take 1 tablet by mouth three times a day as needed for dizziness  #90 x 0   Entered by:   Adella Hare LPN   Authorized by:   [name] MD   Signed by:   Adella Hare LPN on 57/84/6962   Method used:   Electronically to        [facility]  [address] (retail)       [address]       [place], [place]  [address]       Ph: [phone]       Fax: [phone]   RxID:   [id number] MECLIZINE HCL 25 MG TABS (MECLIZINE HCL) Take 1 tablet by mouth three times a day as needed for dizziness  #30 x 2   Entered by:   Lind Guest   Authorized by:   Adella Hare LPN   Signed by:   Lind Guest on 01/21/2011   Method used:   Historical   RxID:   [id number]

## 2011-02-07 NOTE — Progress Notes (Signed)
Summary: RX  Phone Note Call from Patient   Summary of Call: waNTS HER CLONDINIE SEND TO [facility] IT IS CHEAPER THERE Initial call taken by: Lind Guest,  January 29, 2011 10:57 AM    Prescriptions: CLONIDINE HCL 0.2 MG  TABS (CLONIDINE HCL) take two tablets three times daily  #180 Tablet x 2   Entered by:   Everitt Amber LPN   Authorized by:   [name] MD   Signed by:   Everitt Amber LPN on 95/18/8416   Method used:   Electronically to        [facility]  [address] (retail)       [address]       [place], [place]  [address]       Ph: [phone]       Fax: [phone]   RxID:   [id number]

## 2011-02-20 ENCOUNTER — Telehealth: Payer: Self-pay | Admitting: Family Medicine

## 2011-02-20 ENCOUNTER — Ambulatory Visit: Payer: Self-pay | Admitting: Family Medicine

## 2011-02-28 NOTE — Progress Notes (Signed)
Summary: refill  Phone Note Call from Patient   Summary of Call: pt needs refill on thyroid meds and chol and prozax [phone]  [phone] [facility] [place] Initial call taken by: Rudene Anda,  February 20, 2011 9:10 AM    Prescriptions: FLUOXETINE HCL 10 MG TABS (FLUOXETINE HCL) Take 1 tablet by mouth once a day  #30 Capsule x 0   Entered by:   Adella Hare LPN   Authorized by:   [name] MD   Signed by:   Adella Hare LPN on 14/78/2956   Method used:   Electronically to        [facility] (retail)       [address]       [place], [place]  [address]       Ph: [phone]       Fax: [phone]   RxID:   [id number] LOVASTATIN 40 MG TABS (LOVASTATIN) Take 1 tab by mouth at bedtime  #30 Not Speci x 0   Entered by:   Adella Hare LPN   Authorized by:   [name] MD   Signed by:   Adella Hare LPN on 25/95/6387   Method used:   Electronically to        [facility] (retail)       [address]       [place], [place]  [address]       Ph: [phone]       Fax: [phone]   RxID:   [id number] SYNTHROID 100 MCG  TABS (LEVOTHYROXINE SODIUM) one tab by mouth monday through friday and one half tab by mouth on saturday and sunday  #30 Tablet x 0   Entered by:   Jaime Boothe LPN   Authorized by:   Nazarene Bunning MD   Signed by:   Jaime Boothe LPN on 02/20/2011   Method used:   Electronically to        [facility]  [address] (retail)       [address]       [place]       [place], [place]  [address]       Ph: [phone]       Fax: [phone]   RxID:   [id number] SYNTHROID 100 MCG  TABS (LEVOTHYROXINE SODIUM) one tab by mouth monday through friday and one half tab by mouth on saturday and sunday  #30 Tablet x 0   Entered by:   Jaime Boothe LPN   Authorized by:   Volney Reierson MD   Signed by:   Jaime Boothe LPN on 02/20/2011   Method used:   Electronically to        [facility]  [id number] (retail)       [address]       [place], [place]  [address]       Ph: [phone] or [phone]       Fax: [phone]   RxID:   [id number] LOVASTATIN 40 MG TABS (LOVASTATIN) Take 1 tab by mouth at bedtime  #30 Not Speci x 0   Entered by:   Adella Hare LPN   Authorized by:   [name] MD   Signed by:   Adella Hare LPN on 78/93/8101   Method used:   Electronically to  [facility] [id number]* (retail)       [address] [place], [place]  [address]       Ph: [phone] or [phone]       Fax: [phone]   RxID:   [id number] FLUOXETINE HCL 10 MG TABS (FLUOXETINE HCL) Take 1 tablet by mouth once a day  #30 Capsule x 0   Entered by:   Adella Hare LPN   Authorized by:   [name] MD   Signed by:   Adella Hare LPN on 34/74/2595   Method used:   Electronically to        [facility] [id number]* (retail)       [address] [place], [place]  [address]       Ph: [phone] or [phone]       Fax: [phone]   RxID:   [id number]  [facility] scripts cancelled

## 2011-03-11 ENCOUNTER — Encounter: Payer: Self-pay | Admitting: Family Medicine

## 2011-03-11 ENCOUNTER — Telehealth: Payer: Self-pay | Admitting: *Deleted

## 2011-03-11 LAB — DIFFERENTIAL
Basophils Relative: 0 % (ref 0–1)
Eosinophils Absolute: 0.1 10*3/uL (ref 0.0–0.7)
Monocytes Absolute: 0.6 10*3/uL (ref 0.1–1.0)
Monocytes Relative: 7 % (ref 3–12)
Neutrophils Relative %: 55 % (ref 43–77)

## 2011-03-11 LAB — CBC
MCHC: 35.6 g/dL (ref 30.0–36.0)
MCV: 88.5 fL (ref 78.0–100.0)
RBC: 4.11 MIL/uL (ref 3.87–5.11)

## 2011-03-11 LAB — POCT CARDIAC MARKERS
CKMB, poc: 1.7 ng/mL (ref 1.0–8.0)
Myoglobin, poc: 110 ng/mL (ref 12–200)

## 2011-03-11 LAB — URINALYSIS, ROUTINE W REFLEX MICROSCOPIC
Bilirubin Urine: NEGATIVE
Protein, ur: 100 mg/dL — AB
Urobilinogen, UA: 0.2 mg/dL (ref 0.0–1.0)

## 2011-03-11 LAB — URINE MICROSCOPIC-ADD ON

## 2011-03-11 LAB — BASIC METABOLIC PANEL
CO2: 27 mEq/L (ref 19–32)
Chloride: 106 mEq/L (ref 96–112)
Creatinine, Ser: 0.93 mg/dL (ref 0.4–1.2)
GFR calc Af Amer: >60 mL/min (ref >60)
Glucose, Bld: 121 mg/dL — ABNORMAL HIGH (ref 70–99)

## 2011-03-12 ENCOUNTER — Encounter: Payer: Self-pay | Admitting: Family Medicine

## 2011-03-12 ENCOUNTER — Ambulatory Visit (INDEPENDENT_AMBULATORY_CARE_PROVIDER_SITE_OTHER): Payer: MEDICARE | Admitting: Family Medicine

## 2011-03-12 ENCOUNTER — Telehealth: Payer: Self-pay | Admitting: Family Medicine

## 2011-03-12 VITALS — BP 154/72 | HR 56 | Resp 16 | Ht 65.5 in | Wt 227.0 lb

## 2011-03-12 DIAGNOSIS — N289 Disorder of kidney and ureter, unspecified: Secondary | ICD-10-CM

## 2011-03-12 DIAGNOSIS — L80 Vitiligo: Secondary | ICD-10-CM

## 2011-03-12 DIAGNOSIS — E039 Hypothyroidism, unspecified: Secondary | ICD-10-CM

## 2011-03-12 DIAGNOSIS — E669 Obesity, unspecified: Secondary | ICD-10-CM

## 2011-03-12 DIAGNOSIS — I10 Essential (primary) hypertension: Secondary | ICD-10-CM

## 2011-03-12 DIAGNOSIS — E785 Hyperlipidemia, unspecified: Secondary | ICD-10-CM

## 2011-03-12 LAB — POCT URINALYSIS DIPSTICK
Glucose, UA: NEGATIVE
Leukocytes, UA: NEGATIVE
Urobilinogen, UA: 1

## 2011-03-12 MED ORDER — FLUOXETINE HCL 10 MG PO TABS: 10.0000 mg | ORAL_TABLET | Freq: Every day | ORAL | Status: DC

## 2011-03-12 MED ORDER — BENAZEPRIL HCL 20 MG PO TABS: 20.0000 mg | ORAL_TABLET | Freq: Every day | ORAL | Status: DC

## 2011-03-12 MED ORDER — HYDRALAZINE HCL 25 MG PO TABS: 25.0000 mg | ORAL_TABLET | Freq: Three times a day (TID) | ORAL | Status: DC

## 2011-03-12 NOTE — Telephone Encounter (Signed)
Spoke with pharmacy regarding Hydralazine, when refilled the directions were changed by accident, advised to keep as originally ordered two tabs tid- spoke with Ronaldo Miyamoto

## 2011-03-12 NOTE — Progress Notes (Signed)
Subjective:    Patient ID: Melissa [name], female    DOB: 1946/04/03, 65 y.o.   MRN: [id number] The PT is here for follow up and re-evaluation of chronic medical conditions, medication management and review of recent lab and radiology data.  Preventive health is updated, specifically  Cancer screening, and Immunization.   Questions or concerns regarding consultations or procedures which the PT has had in the interim are  addressed. The PT denies any adverse reactions to current medications since the last visit.  There are no new concerns.  She is particularly concerned about her skin and wants a dermatology evaluation, though I advise the condition is benign and cannot be cured.  Hypertension This is a chronic problem. The current episode started more than 1 year ago. The problem is unchanged. Associated symptoms include anxiety. There are no associated agents to hypertension. Risk factors for coronary artery disease include obesity, post-menopausal state and dyslipidemia. Past treatments include ACE inhibitors, central alpha agonists, calcium channel blockers and direct vasodilators. The current treatment provides moderate improvement.      Review of Systems  Genitourinary: Positive for dysuria ( coudy urine intermittently  for 3 months wants it checked).       Cloudy urine like sediment intermittently x 3 months, wants this checked  Musculoskeletal: Positive for back pain and arthralgias.  Skin: Positive for color change ( worsening vitiligo, embarassed to be in public wants derm eval).  Denies recent fever or chills. Denies sinus pressure, nasal congestion, ear pain or sore throat. Denies chest congestion, productive cough or wheezing. Denies chest pains, palpitations, paroxysmal nocturnal dyspnea, orthopnea and leg swelling Denies abdominal pain, nausea, vomiting,diarrhea or constipation.  Denies rectal bleeding or change in bowel movement.  Denies joint pain, swelling and  limitation and mobility. Denies headaches, seizure, numbness, or tingling. Denies uncontroled  depression,she does have increased  anxiety , she denies  insomnia. Denies skin break down .        Objective:   Physical Exam  Nursing note and vitals reviewed. Constitutional: She is oriented to person, place, and time. She appears well-developed and well-nourished.  HENT:  Head: Normocephalic.  Right Ear: External ear normal.  Left Ear: External ear normal.  Mouth/Throat: No oropharyngeal exudate.  Eyes: Conjunctivae and EOM are normal. Right eye exhibits no discharge. Left eye exhibits no discharge. No scleral icterus.  Neck: Normal range of motion. Neck supple. No JVD present. No tracheal deviation present. No thyromegaly present.  Cardiovascular: Normal rate, regular rhythm, normal heart sounds and intact distal pulses.   No murmur heard. Pulmonary/Chest: Effort normal and breath sounds normal. No stridor. No respiratory distress. She has no wheezes. She has no rales. She exhibits no tenderness.  Abdominal: Soft. Bowel sounds are normal. There is no tenderness. There is no rebound and no guarding.  Musculoskeletal: Normal range of motion. She exhibits no edema.  Lymphadenopathy:    She has no cervical adenopathy.  Neurological: She is alert and oriented to person, place, and time. No cranial nerve deficit. Coordination normal.  Skin: Skin is warm and dry. No rash noted. No erythema.       vitiligo  Psychiatric: She has a normal mood and affect. Her behavior is normal. Judgment and thought content normal.          Assessment & Plan:  1. Hypertension: sub optimal control despite [polypharmacy, questionable compliance with meds, importance of same stressed. 2.Hypothyroid on replacement and asymptomatic as far as cold or heat intolerance or  weight change, check tSH 3 Hyperlipidemia: Hyperlipidemia:Low fat diet discussed and encouraged. Continue current meds and labs to be  checked. 4. Vitiligo: deteriorated derm referral 5. Depression improved on medication, continue same. 5. Obesity: lifestyle change encouraged and discussed to promote weight loss

## 2011-03-12 NOTE — Patient Instructions (Addendum)
CPE in 3 months.  You are referred to dermatology .   TSH  And hBA1c as soon as possible.  No med changes at this time.  We are checking your urine for infection , if it is positive , we will send in medication and let you know  Refills for 4 months  Mamogram  due mid June

## 2011-03-21 NOTE — Progress Notes (Signed)
Summary: refills on medicine  Phone Note Call from Patient   Summary of Call: pt needs a refill om hydralazine 25mg  and levothyroxin and lovastatin 40mg  and amlodipine 10mg  and benazepril 20mg  [facility] [place] and fluoxetine 10mg . [phone] Fluid pill Initial call taken by: Rudene Anda,  March 11, 2011 1:12 PM  Follow-up for Phone Call        [facility] [place] as requested Follow-up by: Everitt Amber LPN,  March 11, 2011 1:15 PM    Prescriptions: FLUOXETINE HCL 10 MG TABS (FLUOXETINE HCL) Take 1 tablet by mouth once a day  #30 Capsule x 3   Entered by:   Everitt Amber LPN   Authorized by:   [name] MD   Signed by:   Everitt Amber LPN on 45/40/9811   Method used:   Electronically to        [facility]  [address] (retail)       [address]       [place], [place]  [address]       Ph: [phone]       Fax: [phone]   RxID:   [id number] LOVASTATIN 40 MG TABS (LOVASTATIN) Take 1 tab by mouth at bedtime  #30 Not Speci x 3   Entered by:   Everitt Amber LPN   Authorized by:   [name] MD   Signed by:   Everitt Amber LPN on 66/44/0347   Method used:   Electronically to        [facility]  [place] [address] (retail)       [address]       [place], [place]  [address]       Ph: [phone]       Fax: [phone]   RxID:   [id number] SYNTHROID 100 MCG  TABS (LEVOTHYROXINE SODIUM) one tab by mouth monday through friday and one half tab by mouth on saturday and sunday  #30 Tablet x 3   Entered by:   Brandi Hudy LPN   Authorized by:   Margaret Simpson MD   Signed by:   Brandi Hudy LPN on 03/11/2011   Method used:   [facility]ically to        [facility] [address] (retail)       [address]       [place]       [place], [place]  [address]       Ph: [phone]       Fax: [phone]   RxID:   [id number] BENAZEPRIL HCL 20 MG TABS (BENAZEPRIL HCL) Take 1 tablet by mouth once a day  #30 x 3   Entered by:   Brandi Hudy  LPN   Authorized by:   Margaret Simpson MD   Signed by:   Brandi Hudy LPN on 03/11/2011   Method used:   [facility]ically to        [facility]  [address] (retail)       [address]       [place], [place]  [address]       Ph: [phone]       Fax: [phone]   RxID:   [id number] AMLODIPINE BESYLATE 10 MG  TABS (AMLODIPINE BESYLATE) one tab by mouth once daily  #30 Not Speci x 3   Entered by:  Everitt Amber LPN   Authorized by:   [name] MD   Signed by:   Everitt Amber LPN on 16/09/9603   Method used:   Electronically to        [facility]  [address] (retail)       [address]       [place], [place]  [address]       Ph: [phone]       Fax: [phone]   RxID:   [id number] HYDRALAZINE HCL 25 MG  TABS (HYDRALAZINE HCL) Two tabs by mouth three times a day  #180 Each x 3   Entered by:   Everitt Amber LPN   Authorized by:   [name] MD   Signed by:   Everitt Amber LPN on 10/19/2535   Method used:   Electronically to        [facility]  [address] (retail)       [address]       [place], [place]  [address]       Ph: [phone]       Fax: [phone]   RxID:   [id number]

## 2011-03-22 ENCOUNTER — Other Ambulatory Visit: Payer: Self-pay | Admitting: Family Medicine

## 2011-03-28 ENCOUNTER — Telehealth: Payer: Self-pay | Admitting: Family Medicine

## 2011-03-28 DIAGNOSIS — R7301 Impaired fasting glucose: Secondary | ICD-10-CM

## 2011-04-01 ENCOUNTER — Encounter: Payer: Self-pay | Admitting: Family Medicine

## 2011-04-01 DIAGNOSIS — L80 Vitiligo: Secondary | ICD-10-CM | POA: Insufficient documentation

## 2011-04-04 NOTE — Telephone Encounter (Signed)
H1c not drawn at lab last time and it should have been. Reordered and patient going sat to have done

## 2011-04-05 ENCOUNTER — Encounter: Payer: Self-pay | Admitting: Family Medicine

## 2011-04-23 LAB — HEMOGLOBIN A1C
Hgb A1c MFr Bld: 6.1 % — ABNORMAL HIGH (ref <5.7)
Mean Plasma Glucose: 128 mg/dL — ABNORMAL HIGH (ref <117)

## 2011-05-03 ENCOUNTER — Telehealth: Payer: Self-pay | Admitting: Family Medicine

## 2011-05-03 NOTE — Telephone Encounter (Signed)
Patient was advised by front staff to stop by office, we can give her a new meter

## 2011-05-07 NOTE — Discharge Summary (Signed)
NAMEIVRY, PIGUE             ACCOUNT NO.:  [id number]   MEDICAL RECORD NO.:  [id number]          PATIENT TYPE:  INP   LOCATION:  1436                         FACILITY:  [facility]   PHYSICIAN:  Sigmund I. Patsi Sears, M.D.DATE OF BIRTH:  [birth year]   DATE OF ADMISSION:  12/04/2007  DATE OF DISCHARGE:  12/08/2007                               DISCHARGE SUMMARY   REASON FOR ADMISSION:  Right renal mass.   PROCEDURE:  Right radical nephrectomy.   HISTORY OF PRESENT ILLNESS:  In brief, [name] is a 65 year old  female with a history of a renal mass. She presented electively for  radicle nephrectomy.   HOSPITAL COURSE:  The patient was admitted postoperatively after her  radical nephrectomy. Please see the operative report dictated on the  date of admission. Postoperatively, the patient did well. She had some  labile blood pressures initially with them ranging on the hypertensive  side, however, this was controlled with p.r.n. medications and home  medications. She started to ambulate on postoperative day 1 and we  advanced her diet at that time. With good bowel function, she was  advanced to a regular diet. At the time of discharge, she was  ambulating, having normal bowel movements, tolerating a good diet, and  her pain was controlled. Her wound was healing appropriately.   DISCHARGE MEDICATIONS:  Please see discharge medication reconciliation.   DRAINS:  None.   DISPOSITION:  She will be discharged to home.   FOLLOWUP:  The patient will followup as scheduled with Dr. Patsi Sears  for staple removal and discussion of pathology.      Terie Purser, MD      Sigmund I. Patsi Sears, M.D.  Electronically Signed    JH/MEDQ  D:  12/08/2007  T:  12/08/2007  Job:  [id number]

## 2011-05-07 NOTE — Op Note (Signed)
NAMESERIANNA, [name]             ACCOUNT NO.:  [id number]   MEDICAL RECORD NO.:  [id number]          PATIENT TYPE:  INP   LOCATION:  1436                         FACILITY:  [facility]   PHYSICIAN:  Sigmund I. Patsi Sears, M.D.DATE OF BIRTH:  May 30, 1946   DATE OF PROCEDURE:  12/04/2007  DATE OF DISCHARGE:  11/29/2007                               OPERATIVE REPORT   PROCEDURE:  1. Right radical nephrectomy.  2. Placement of Marcaine pain pump.   PREOPERATIVE DIAGNOSIS:  Right lower pole renal tumor.   POSTOPERATIVE DIAGNOSIS:  Right lower pole renal tumor.   INDICATIONS FOR PROCEDURE:  Melissa [name] is a 65 year old female who  was found to have a large renal mass on CT scan.  She was referred to  Dr. Imelda Pillow office for further care.  She has been scheduled  electively today for a right radical nephrectomy.   PROCEDURE IN DETAIL:  The patient was brought back into the operating  room.  After the successful induction of general endotracheal anesthetic  she was placed in the right up flank position with all pressure points  appropriately padded.  A time-out was performed and she received  preoperative antibiotics.  She was prepped and draped in the usual  sterile fashion with the kidney rest up and the table flexed.  At this  point we identified the eleventh rib.  An incision was made over the  eleventh rib towards the umbilicus.  We then used cautery to dissect  down through rib.  The rib was freed superiorly and inferiorly with  cautery also with the help of a Duryea periosteal stripper.  We then cut  the rib with rib cutters and filed down the sharp edges.  We continued  our dissection with the Bovie through the external oblique fascia and  the internal oblique fascia.  At this point we developed the  retroperitoneal space at the point of where the tip of the eleventh rib  was.  We dissected peritoneum medial and anteriorly and pleura and  diaphragma superiorly.  At this point  we were able to identify Gerota's  fascia and the kidney underlying it was readily palpable with its mass.  We then incised Gerota's fascia sharply and freed up the kidney from its  medial posterior attachments.  We then worked on the inferior pole of  the kidney, identified the inferior pole of the kidney at the mass and  the ureter.  At this point the ureter was clipped and divided.  We  continued our dissection around superiorly and medially on the kidney.  We then identified the hilum.  The renal vein with the two branches were  identified and they were marked with vein ties.  Slightly elevating the  vein we were able to identify the renal artery.  We placed the right  angle under the artery, tied it with two proximal ties and one distal  tie.  We then divided the artery and the kidneys __________ .  We then  tied the branches of the renal vein with two ties distally and one tie  proximally on each of these veins  and divided it too.  At this point the  kidney was freely mobile and it was removed as our specimen.  We then  identified the renal bed and there was no evident bleeding.  We  irrigated and turned our attention to closing the wound.   With 1-0 PDS we closed the transversalis muscle.  We then placed both of  the Marcaine pump delivering probes, one below the external oblique  fascia and one above it.  We additionally injected Marcaine within the  wound.  At this point we used a 1-0 PDS to reapproximate external and  internal oblique muscles.  We then used staples to close the skin.  The  Marcaine probes were secured to the skin and attached to the pump.  At  this point the procedure was ended.  Please note Dr. Patsi Sears was  present throughout the entirety of the case.  Estimated blood loss 100  mL.  Urine output was unrecorded.  Drains were 18 French Foley catheter.   DISPOSITION:  The patient will go to the PACU for further care.  There  she will have a chest x-ray as well  as blood work.     ______________________________  Allena Katz, Dr      [name], M.D.  Electronically Signed    Melissa [name]  D:  12/04/2007  T:  12/05/2007  Job:  [id number]

## 2011-05-07 NOTE — Discharge Summary (Signed)
Melissa [name], Melissa [name]             ACCOUNT NO.:  [id number]   MEDICAL RECORD NO.:  [id number]          PATIENT TYPE:  INP   LOCATION:  1412                         FACILITY:  [facility]   PHYSICIAN:  Ladell Pier, M.D.   DATE OF BIRTH:  [birth year]   DATE OF ADMISSION:  12/10/2007  DATE OF DISCHARGE:                               DISCHARGE SUMMARY   DISCHARGE DATE:  To be determined.   ADMISSION DIAGNOSES:  1. Chest pain, reason for catheterization on December 23.  2. Hypertension that is labile, difficult to control.  3. Renal cell cancer.  4. Hypothyroidism.  5. Obesity..  6. Anemia with a negative colonoscopy.  7. Hypothyroidism.   DISCHARGE MEDICATIONS:  To be determined at the time of discharge.   CONSULTATIONS:  Cardiology, Dani Gobble, MD.   PROCEDURES:  1. The patient is to have cardiac catheterization done on December 15, 2007.  2. The patient had staples removed from her nephrectomy site.   FOLLOWUP:  The patient told to follow up on discharge with Dr.  Patsi Sears to discuss the results of her pathology.   HISTORY OF PRESENT ILLNESS:  The patient is a 65 year old female with  recent history of right nephrectomy for renal mass noted on recent  ultrasound and subsequent CT who presented to the ER with acute onset of  chest pressure with radiation to the left arm.  She stated that she has  never had that before.  She stated that she has had a stress test done  probably last year, and there were no acute findings.  Please see  admission note for remainder of history.   PAST MEDICAL HISTORY, FAMILY HISTORY, SOCIAL HISTORY, MEDICATIONS,  ALLERGIES, PHYSICAL EXAMINATION:  See admission H&P.   PHYSICAL EXAMINATION ON DISCHARGE:  VITAL SIGNS:  Temperature 97.5,  pulse 69, respirations 18, blood pressure 125/60, pulse oximetry 100% on  2 liters.  HEENT:  Normocephalic and atraumatic.  Pupils equal, round, and reactive  to light.  Throat without erythema.  CARDIOVASCULAR:  Regular rate and rhythm.  LUNGS:  Clear bilaterally.  ABDOMEN:  Positive bowel sounds.  EXTREMITIES:  No edema.   HOSPITAL COURSE:  #1.  CHEST PAIN:  The patient was admitted to the hospital. Cardiac  enzymes, EKG were done that were negative.  She has seen Dr. Domingo Sep at  [facility] in the past.  They were consulted.  The patient is  scheduled for cardiac catheterization.   #2.  RENAL CELL CANCER:  The patient recently had surgery, was due to  have the staples removed.  I did call the urology center to inform them  that the  patient is here.  I discharged her staples.  She will follow  up outpatient for discussion of her pathology. She does not know she has  renal cell cancer as I did not discuss that with her.  She will focus on  her part, and she will follow up for discussion in January.  I told  patient to definitely not miss her appointment.   #3.  HYPERTENSION:  Blood pressure is now doing well,  118-125/54-60.  Will continue all her blood pressure medications.   #4.  HYPOTHYROIDISM:  Will continue Synthroid.   DISCHARGE LABORATORY DATA:  Total cholesterol 164. Triglycerides 165,  HDL 23, LDL 108.  Sodium 135, potassium 3.9, chloride 103, CO2 28,  glucose 94, BUN 12,  creatinine 1.45.  WBC 5.9, hemoglobin 9, platelets  375.  D-dimer 2.66.  BNP 128.  TSH 0.902.   CT of the chest: No evidence of PE.   Chest x-ray:  Improved aeration, decreased atelectasis.      Ladell Pier, M.D.  Electronically Signed     NJ/MEDQ  D:  12/14/2007  T:  12/14/2007  Job:  [id number]

## 2011-05-07 NOTE — Cardiovascular Report (Signed)
NAMEGWENDLYON, Melissa [name] NO.:  [id number]   MEDICAL RECORD NO.:  [id number]          PATIENT TYPE:  INP   LOCATION:  2035                         FACILITY:  [facility]   PHYSICIAN:  Richard A. Alanda Amass, M.D.DATE OF BIRTH:  02-06-1946   DATE OF PROCEDURE:  12/15/2007  DATE OF DISCHARGE:                            CARDIAC CATHETERIZATION   PROCEDURES:  Retrograde central aortic catheterization, selective  coronary angiography using hand injection and limited Omnipaque dye  after preoperative hydration, selective left renal angiogram, hand  injection.  Nitinol clip 6-French StarClose common right femoral artery  .  Closure device successfully deployed .   DESCRIPTION OF PROCEDURE:  The patient was brought to the second floor  CP lab in a post absorptive state.  She was hydrated preoperatively, and  creatinine had been stable for several days at 1.43.  She is status post  right radical nephrectomy by Dr. Patsi Sears on December 04, 2007, for  renal cell carcinoma.  The patient has a history of very-difficult-to-  treat systemic hypertension, hypothyroidism on therapy, exogenous  obesity, anemia with negative colonoscopy and GERD.  She was initially  worked up by Dr. Domingo Sep for systemic hypertension, and renal Dopplers  demonstrated a right renal mass.  She was referred to Dr. Patsi Sears and  underwent right radical nephrectomy, complicated as outlined above.  She  was readmitted to the hospital for chest pain on December 11, 2007.  Myocardial infarction was ruled out by serial enzymes and EKGs, but she  had recurrent nitrate-responsive chest pain in the hospital.  A CT of  the chest was done which showed no pulmonary emboli on December 11, 2007.  She was referred for elective coronary angiography.  In this  setting, we waited several days, hydrated preoperatively before giving  her any dye with a known single kidney.  Because of her resistant  hypertension, we had planned  to try to do selective left renal angiogram  as well.  Informed consent was obtained to proceed.   The patient fell was given 2 mg of Versed for sedation in the lab; 1%  Xylocaine was used for local anesthesia, and the right common femoral  artery was entered with a single anterior puncture using an 18 thin-wall  needle, and a 6-French short sidearm sheath was inserted without  difficulty.  Selective coronary angiography was done by hand injections  with Omnipaque dye, using limited dye and views of the left and right  coronary artery.  Adequate visualization was obtained.  The right LV  pressures were then obtained with the right coronary catheter.  There  was no gradient across the aortic valve on pullback.  LV angiography was  not done with recently normal LV systolic function on outpatient 2-D  echocardiogram.   Selective left renal angiogram was done with a single hand injection in  the PA projection.  Catheter was removed.  The right common femoral  puncture site was closed with a 6-French StarClose nitinol clip device  successfully.  The patient was brought to the holding area for  postoperative care in stable condition.  She was given 10  mg of  labetalol IV for blood pressure control during the procedure.  She  tolerated the procedure well.   PRESSURES:  Systolic pressures with 200-210 prior to labetalol and  dropped to 175/80 with a heart rate of 65.   Fluoroscopy did not show to show any coronary calcification.   The main left coronary was normal.   The left anterior descending was widely patent and normal, coursed to  the apex of the heart and gave off a normal proximal moderate-size  diagonal-1 after SP1 and a normal large diagonal-2 from the junction of  the proximal third after SP2 that bifurcated.   The circumflex artery was nondominant,  of moderate size, with two  marginal branches and normal atria and posterior AV groove branch.   The right coronary was a  dominant vessel that was widely patent, smooth  throughout, mildly tortuous, with a normal PDA and posterolateral  artery.  The RV branch was normal from the mid vessel.   Selective left renal angiogram revealed a widely patent large left renal  artery with no evidence of narrowing obstruction or FND.  The artery  bifurcated early into a large anterior and posterior division.  As noted  above, the right kidney was surgically absent.   Fortunately, this patient has entirely normal coronary arteries.  She  also has normal left renal artery angiographically.  The etiology of her  chest pain is unclear but probably represents esophageal spasm and/or  reflux symptoms.  We recommend medical therapy.  She will need followup  of her renal function with single kidney, significant hypertension, and  metabolic syndrome with hyperlipidemia.  I have discussed her  presumptive diagnosis of renal cell cancer with her and the fact that  she had a total right nephrectomy, presumably with tumor entirely  removed.  But I told her she would have to talk to Dr. Patsi Sears about  this in followup.  Because of her recent admission to the hospital, she  has not seen Dr. Patsi Sears yet in followup.   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  Her hypertension is  chronic as outlined above.   CATHETERIZATION DIAGNOSES:  1. Chest pain, etiology not determined.  2. Probable gastroesophageal reflux disease or esophageal spasm  3. Exogenous obesity.  4. Status post right nephrectomy, right radical for hypernephroma      December 04, 2007, Dr. Patsi Sears.  5. Chronic systemic hypertension, difficult to control.  6. Normal coronary arteries.  7. Normal systolic left ventricular function on outpatient 2-D      echocardiogram.  8. Widely patent normal left renal  artery.  9. History of anemia.  10.Hyperlipidemia.  11.Metabolic syndrome.      Richard A. Alanda Amass, M.D.  Electronically Signed     RAW/MEDQ  D:  12/15/2007  T:  12/15/2007  Job:  [id number]   cc:   Dani Gobble, MD  Sigmund I. Patsi Sears, M.D.  Milus Mallick. Lodema Hong, M.D.  Dr.  Monna Fam Office  CP Lab

## 2011-05-07 NOTE — H&P (Signed)
Melissa [name], SHUBA NO.:  [id number]   MEDICAL RECORD NO.:  [id number]          PATIENT TYPE:  INP   LOCATION:  1412                         FACILITY:  [facility]   PHYSICIAN:  Gardiner Barefoot, MD    DATE OF BIRTH:  02/05/1946   DATE OF ADMISSION:  12/10/2007  DATE OF DISCHARGE:                              HISTORY & PHYSICAL   PRIMARY CARE PHYSICIAN:  Unassigned.   CHIEF COMPLAINT:  Chest pain.   HISTORY OF PRESENT ILLNESS:  This is a 65 year old female with a recent  history of right renal nephrectomy for a renal mass noted on recent  ultrasound and subsequent CT scan who presented here with the acute  onset of chest pressure that radiated to her left arm.  The patient said  she has never had that before.  The patient reports no nausea or other  associated symptoms.  The patient also has complaint of constipation and  increased gas with no bowel movement for approximately 4 days since post  her surgery.  The patient reports that her pain at the time of exam was  gone and it was relieved by IV medication.  Otherwise the patient has no  complaints and continued to tolerate p.o.  Of note, the patient's renal  mass was diagnosis of pathology with renal cell carcinoma.  However, the  patient has not discussed this with the surgeon yet at this time and is  unaware of pathology report.  The patient describes her chest pain as  not being associated with breathing.   PAST MEDICAL HISTORY:  1. Hypertension.  2. Heart failure.  3. Hypothyroidism.   MEDICATIONS:  1. Synthroid 100 mcg p.o. daily.  2. Clonidine 0.2 mg p.o. b.i.d.  3. Lotrel 5/20 one p.o. daily.  4. Hydrochlorothiazide 25 mg daily.  5. Furosemide 30 mg p.o. daily.  6. Ambien 10 mg daily.  7. Labetalol 300 mg b.i.d.  8. Potassium chloride 20 mEq daily.  9. Meclizine 25 mg p.r.n.   DRUG ALLERGIES:  DILAUDID causes itching.   FAMILY HISTORY:  Mother with heart disease in her 39s and an enlarged  heart.   SOCIAL HISTORY:  The patient denies tobacco, alcohol or drugs.   REVIEW OF SYSTEMS:  Negative except as per the HPI.   PHYSICAL EXAM:  VITALS:  Temperature is 100.4, pulse 84, respirations  18, blood pressure is 161/81.  GENERAL:  The patient is awake, alert and oriented x3.  Appears in no  acute distress.  HEENT:  Anicteric, birth mark on bilateral upper cheeks.  CARDIOVASCULAR:  Regular rate and rhythm with no murmurs, rubs or  gallops.  LUNGS:  Clear to auscultation bilaterally.  ABDOMEN:  Abdomen is soft, nontender, nondistended.  Positive bowel  sounds.  No hepatosplenomegaly.  Obese.  EXTREMITIES:  With no cyanosis, clubbing or edema.  No calf tenderness  bilaterally.  SKIN:  Right renal incision with staples and was clean and dry and  intact without erythema or discharge.   LABORATORY DATA:  Troponin is less than 0.05, D-dimer is 2.66.  EKG with  some inverted T-waves.  INR  is 1.1, sodium 136, potassium 3.2, chloride  98, bicarb 32, BUN 12, creatinine 1.24, glucose 97, WBCs 8.2, hemoglobin  9.9 and platelets 344.   ASSESSMENT AND PLAN:  1. Chest pain.  Etiology unlikely to be PE as there is no dyspnea or      tachycardia, no calf tenderness or other signs of DVT.  However,      she does have a right arm thrombophlebitis.  This potentially could      be the site of a small clot.  CT angio at this time is pending.      Cardiac etiology is a possibility and we will check her serial      enzymes and have the patient monitored on telemetry.  The patient,      however, does report she had a stress test approximately 2 months      ago, pharmacological.  She reports that she did not hear that there      was any abnormalities and this was done as part of her preop      workup.  Will need to obtain records of this in the a.m. with the      cardiologist who performed it.  2. Constipation.  The patient has had constipation since her surgery      and this also could be an  etiology for her chest pain.  We will      start her on MiraLax and continue a bowel regimen.  3. Nephrectomy.  The patient was diagnosed with renal cell carcinoma      on pathology, however, is unaware of the diagnosis at this time and      will defer the management and discussion with her surgeon.  Also,      will hydrate the patient and hold furosemide secondary to the dye      load from the CT angio.  4. Hypokalemia.  Will replace in her IV fluids.  5. Hypothyroidism.  Will continue with the patient's Synthroid and      recheck TSH.      Gardiner Barefoot, MD  Electronically Signed     RWC/MEDQ  D:  12/11/2007  T:  12/11/2007  Job:  [id number]

## 2011-05-07 NOTE — Discharge Summary (Signed)
[name], [name] NO.:  [id number]   MEDICAL RECORD NO.:  [id number]          PATIENT TYPE:  INP   LOCATION:  2035                         FACILITY:  [facility]   PHYSICIAN:  Melissa [name], M.D.DATE OF BIRTH:  17-Nov-1946   DATE OF ADMISSION:  12/15/2007  DATE OF DISCHARGE:  12/16/2007                               DISCHARGE SUMMARY   DISCHARGE DIAGNOSES:  1. Chest pain and tightness status post cardiac cath during this      admission revealing no coronary disease.  2. Dyspnea.  The patient ruled out for pulmonary embolism.  3. Hypertension, hard to control.  4. Renal insufficiency - diuretics and ACE inhibitors on call post      cath.  5. Hyperlipidemia.  6. Anemia - stable.  7. Hypothyroidism.  8. Obesity.   HISTORY:  This is a 65 year old female patient of Dr. Domingo [name] who  presented to the hospital with complaints of dyspnea and chest pain.  She was admitted on rule out MI protocol.  Her enzymes were negative x3  and we were asked to see the patient for further evaluation of chest  pain.   During this admission the patient underwent CAT scan for elevated D-  dimer at 2.66 and the chest CT was negative for pulmonary embolism.   The patient underwent catheterization during this admission by Dr.  Alanda [name] which showed normal coronaries and normal ejection fraction  with dominant right coronary artery.  Her blood pressure during the cath  was elevated to 200 systolic which dropped later to 175/80.   In post cath the creatinine increased slightly from 1.24 on admission to  1.47 and we held the patient's diuretics and ACE inhibitor at her  discharge.   She also had an anemia panel drawn which revealed a reticulocyte count  1.2 normal, red blood cell count was slightly low 3.11 million, her  serum iron was borderline low 42, total iron binding capacity 229, low  UIBC 187, B12 332, folate 19.1 and ferritin 140, all of those three  tests were within  normal limits.   Her hemoglobin on discharge was 9.3 and on admission 9.4, that remained  stable.  TSH was 0.902.  BNP on admission was 128.  BMP showed sodium  136, potassium 3.5, chloride 104, CO2 of 28, glucose 94, BUN 9 and  creatinine 1.47.   The patient remained stable after cath, ambulated down the ward without  any difficulties and was discharged home in stable condition.   DISCHARGE INSTRUCTIONS:  She was instructed to hold Lasix for 2 days and  them resume her dose and hold Lotensin as well for 2 days and then  resume her usual dose.   DISCHARGE MEDICATIONS:  Lasix 80 mg daily after 48 hours on hold,  Lotensin 20 mg daily after 48 hours on call, Norvasc 10 mg daily,  labetalol 600 mg t.i.d., hydralazine 50 mg t.i.d., clonidine 0.2 two  pills t.i.d., Synthroid 100 mcg daily, potassium chloride 20 mEq daily,  Zocor 20 mg daily, Ambien as needed 5 mg daily p.r.n. daily at bedtime.   DISCHARGE INSTRUCTIONS:  Diet  low fat, low cholesterol, low salt diet.  The patient was instructed to increase activity slowly, avoid driving  for 3 days post cath, report any problems with groin puncture site to  our office.   FOLLOWUP:  Her followup appointment will be scheduled in 2 weeks with  Dr. Domingo [name] and office will contact the patient with that information.      Melissa [name], P.A.      Melissa [name], M.D.  Electronically Signed    MK/MEDQ  D:  12/16/2007  T:  12/16/2007  Job:  [id number]   cc:   Dani Gobble, MD

## 2011-05-13 ENCOUNTER — Other Ambulatory Visit: Payer: Self-pay | Admitting: Family Medicine

## 2011-05-13 DIAGNOSIS — Z139 Encounter for screening, unspecified: Secondary | ICD-10-CM

## 2011-05-17 ENCOUNTER — Encounter: Payer: Self-pay | Admitting: Family Medicine

## 2011-05-17 ENCOUNTER — Ambulatory Visit (HOSPITAL_COMMUNITY)
Admission: RE | Admit: 2011-05-17 | Discharge: 2011-05-17 | Disposition: A | Discharge status: Home / Self Care | Payer: Medicare Other | Source: Ambulatory Visit | Attending: Family Medicine | Admitting: Family Medicine

## 2011-05-17 ENCOUNTER — Ambulatory Visit (INDEPENDENT_AMBULATORY_CARE_PROVIDER_SITE_OTHER): Payer: Medicare Other | Admitting: Family Medicine

## 2011-05-17 VITALS — BP 150/80 | HR 96 | Resp 16 | Ht 65.0 in | Wt 226.1 lb

## 2011-05-17 DIAGNOSIS — I10 Essential (primary) hypertension: Secondary | ICD-10-CM | POA: Insufficient documentation

## 2011-05-17 DIAGNOSIS — R062 Wheezing: Secondary | ICD-10-CM | POA: Insufficient documentation

## 2011-05-17 DIAGNOSIS — J209 Acute bronchitis, unspecified: Secondary | ICD-10-CM

## 2011-05-17 DIAGNOSIS — R05 Cough: Secondary | ICD-10-CM | POA: Insufficient documentation

## 2011-05-17 DIAGNOSIS — J019 Acute sinusitis, unspecified: Secondary | ICD-10-CM

## 2011-05-17 DIAGNOSIS — IMO0002 Reserved for concepts with insufficient information to code with codable children: Secondary | ICD-10-CM

## 2011-05-17 DIAGNOSIS — R1031 Right lower quadrant pain: Secondary | ICD-10-CM

## 2011-05-17 DIAGNOSIS — R059 Cough, unspecified: Secondary | ICD-10-CM | POA: Insufficient documentation

## 2011-05-17 MED ORDER — BENZONATATE 100 MG PO CAPS: 100.0000 mg | ORAL_CAPSULE | Freq: Four times a day (QID) | ORAL | Status: DC | PRN

## 2011-05-17 MED ORDER — PREDNISONE (PAK) 5 MG PO TABS: 5.0000 mg | ORAL_TABLET | ORAL | Status: DC

## 2011-05-17 MED ORDER — ALBUTEROL SULFATE (2.5 MG/3ML) 0.083% IN NEBU
2.5000 mg | INHALATION_SOLUTION | Freq: Once | RESPIRATORY_TRACT | Status: AC
  Administered 2011-05-17: 2.5 mg via RESPIRATORY_TRACT

## 2011-05-17 MED ORDER — LEVOTHYROXINE SODIUM 100 MCG PO TABS: 100.0000 ug | ORAL_TABLET | Freq: Every day | ORAL | Status: DC

## 2011-05-17 MED ORDER — KETOROLAC TROMETHAMINE 60 MG/2ML IM SOLN
60.0000 mg | Freq: Once | INTRAMUSCULAR | Status: AC
  Administered 2011-05-17: 60 mg via INTRAMUSCULAR

## 2011-05-17 MED ORDER — METHYLPREDNISOLONE ACETATE 80 MG/ML IJ SUSP
80.0000 mg | Freq: Once | INTRAMUSCULAR | Status: AC
  Administered 2011-05-17: 80 mg via INTRAMUSCULAR

## 2011-05-17 MED ORDER — IPRATROPIUM BROMIDE 0.02 % IN SOLN
0.5000 mg | Freq: Once | RESPIRATORY_TRACT | Status: AC
  Administered 2011-05-17: 0.5 mg via RESPIRATORY_TRACT

## 2011-05-17 MED ORDER — CEFTRIAXONE SODIUM 500 MG IJ SOLR
500.0000 mg | Freq: Once | INTRAMUSCULAR | Status: AC
  Administered 2011-05-17: 500 mg via INTRAMUSCULAR

## 2011-05-17 MED ORDER — SULFAMETHOXAZOLE-TRIMETHOPRIM 800-160 MG PO TABS: 1.0000 | ORAL_TABLET | Freq: Two times a day (BID) | ORAL | Status: AC

## 2011-05-17 NOTE — Patient Instructions (Addendum)
F/U as before.  You are being treated for acute sinusitis and bronchitis.Injections and breathing treatment will be given in the office and med is also sent tio the pharmacy  You will get  Injections  for the pain in your right groin.  I hope you feel better soon CXR today

## 2011-05-17 NOTE — Progress Notes (Signed)
Subjective:    Patient ID: Melissa [name], female    DOB: [birth year], 65 y.o.   MRN: [id number]  HPI 3 day h/o fever, chills, head and chest congestion with cough prodructive of green sputum Right groin pain radiating to back x 1 week, has arthritis of the spine. Prior to this she had been well.    Review of Systems See HPI  Denies  palpitations, paroxysmal nocturnal dyspnea, orthopnea and leg swelling Denies abdominal pain, nausea, vomiting,diarrhea or constipation.   Denies dysuria, frequency, hesitancy or incontinence. Denies joint pain, swelling and limitation in mobility. Headache and maxillary pressure, . Denies depression, anxiety or insomnia. Denies skin break down or rash.   `    Objective:   Physical Exam Patient alert and oriented and in no Cardiopulmonary distress.  HEENT: No facial asymmetry, EOMI, maxillary  sinus tenderness, TM's clear, Oropharynx pink and moist.  Neck supple anterior cervical adenitis.  Chest: decreased air entry scattered crackles and few wheezes  CVS: S1, S2 no murmurs, no S3.  ABD: Soft non tender. Bowel sounds normal.  Ext: No edema  MS: Adequate ROM spine, shoulders, hips and knees.  Skin: Intact, no ulcerations or rash noted.  Psych: Good eye contact, normal affect. Memory intact not anxious or depressed appearing.  CNS: CN 2-12 intact, power, tone and sensation normal throughout.        Assessment & Plan:

## 2011-05-21 ENCOUNTER — Telehealth: Payer: Self-pay | Admitting: Family Medicine

## 2011-05-21 NOTE — Telephone Encounter (Signed)
Patient was asking if she needed to take all the meds sent in, states she feels much better from shots in office, advised patient not necessary if she feels much better

## 2011-05-24 DIAGNOSIS — J019 Acute sinusitis, unspecified: Secondary | ICD-10-CM | POA: Insufficient documentation

## 2011-05-24 NOTE — Assessment & Plan Note (Signed)
Treatment given in office as well as sent to the pharmacy

## 2011-05-24 NOTE — Assessment & Plan Note (Signed)
Acute presentation, likely from DJD spine, anti-inflammatory administered

## 2011-05-24 NOTE — Assessment & Plan Note (Signed)
Antibiotics prescribed 

## 2011-05-24 NOTE — Assessment & Plan Note (Signed)
Controlled, no change in medication  

## 2011-05-24 NOTE — Assessment & Plan Note (Signed)
Deteriorated, exp right groin pain, toradol administered in the office

## 2011-06-10 ENCOUNTER — Ambulatory Visit (HOSPITAL_COMMUNITY)
Admission: RE | Admit: 2011-06-10 | Discharge: 2011-06-10 | Disposition: A | Discharge status: Home / Self Care | Payer: Medicare Other | Source: Ambulatory Visit | Attending: Family Medicine | Admitting: Family Medicine

## 2011-06-10 DIAGNOSIS — Z139 Encounter for screening, unspecified: Secondary | ICD-10-CM

## 2011-06-10 DIAGNOSIS — Z1231 Encounter for screening mammogram for malignant neoplasm of breast: Secondary | ICD-10-CM | POA: Insufficient documentation

## 2011-06-11 ENCOUNTER — Encounter: Payer: Self-pay | Admitting: Family Medicine

## 2011-06-17 ENCOUNTER — Encounter: Payer: Self-pay | Admitting: Family Medicine

## 2011-06-17 ENCOUNTER — Other Ambulatory Visit (HOSPITAL_COMMUNITY)
Admission: RE | Admit: 2011-06-17 | Discharge: 2011-06-17 | Disposition: A | Discharge status: Home / Self Care | Payer: Medicare Other | Source: Ambulatory Visit | Attending: Family Medicine | Admitting: Family Medicine

## 2011-06-17 ENCOUNTER — Ambulatory Visit (INDEPENDENT_AMBULATORY_CARE_PROVIDER_SITE_OTHER): Payer: Medicare Other | Admitting: Family Medicine

## 2011-06-17 VITALS — BP 160/72 | HR 87 | Resp 16 | Ht 65.0 in | Wt 228.8 lb

## 2011-06-17 DIAGNOSIS — Z124 Encounter for screening for malignant neoplasm of cervix: Secondary | ICD-10-CM

## 2011-06-17 DIAGNOSIS — F3289 Other specified depressive episodes: Secondary | ICD-10-CM

## 2011-06-17 DIAGNOSIS — R7301 Impaired fasting glucose: Secondary | ICD-10-CM

## 2011-06-17 DIAGNOSIS — E039 Hypothyroidism, unspecified: Secondary | ICD-10-CM

## 2011-06-17 DIAGNOSIS — Z1211 Encounter for screening for malignant neoplasm of colon: Secondary | ICD-10-CM

## 2011-06-17 DIAGNOSIS — R5383 Other fatigue: Secondary | ICD-10-CM

## 2011-06-17 DIAGNOSIS — R5381 Other malaise: Secondary | ICD-10-CM

## 2011-06-17 DIAGNOSIS — F329 Major depressive disorder, single episode, unspecified: Secondary | ICD-10-CM

## 2011-06-17 DIAGNOSIS — Z23 Encounter for immunization: Secondary | ICD-10-CM

## 2011-06-17 DIAGNOSIS — Z01419 Encounter for gynecological examination (general) (routine) without abnormal findings: Secondary | ICD-10-CM | POA: Insufficient documentation

## 2011-06-17 DIAGNOSIS — Z0000 Encounter for general adult medical examination without abnormal findings: Secondary | ICD-10-CM

## 2011-06-17 DIAGNOSIS — E785 Hyperlipidemia, unspecified: Secondary | ICD-10-CM

## 2011-06-17 DIAGNOSIS — L80 Vitiligo: Secondary | ICD-10-CM

## 2011-06-17 DIAGNOSIS — I10 Essential (primary) hypertension: Secondary | ICD-10-CM

## 2011-06-17 MED ORDER — LOVASTATIN 40 MG PO TABS: 40.0000 mg | ORAL_TABLET | Freq: Every day | ORAL | Status: DC

## 2011-06-17 MED ORDER — FLUOXETINE HCL 10 MG PO TABS: 10.0000 mg | ORAL_TABLET | Freq: Every day | ORAL | Status: DC

## 2011-06-17 MED ORDER — LEVOTHYROXINE SODIUM 100 MCG PO TABS: 100.0000 ug | ORAL_TABLET | Freq: Every day | ORAL | Status: DC

## 2011-06-17 NOTE — Patient Instructions (Signed)
F/u in 4 months.for annual wellness exam  Fasting lipid, hepatic, chem 7, HBA1C, tSH and cBC mid August pls  It is important that you exercise regularly at least 30 minutes 5 times a week. If you develop chest pain, have severe difficulty breathing, or feel very tired, stop exercising immediately and seek medical attention  A healthy diet is rich in fruit, vegetables and whole grains. Poultry fish, nuts and beans are a healthy choice for protein rather then red meat. A low sodium diet and drinking 64 ounces of water daily is generally recommended. Oils and sweet should be limited. Carbohydrates especially for those who are diabetic or overweight, should be limited to 34-45 gram per meal. It is important to eat on a regular schedule, at least 3 times daily. Snacks should be primarily fruits, vegetables or nuts.   You will get TdAp and pneumonia vaccine today

## 2011-06-23 NOTE — Progress Notes (Signed)
Subjective:    Patient ID: Melissa [name], female    DOB: [birth year], 65 y.o.   MRN: [id number]  HPI The PT is here for annual exam and re-evaluation of chronic medical conditions, medication management and review of recent lab and radiology data.  Preventive health is updated, specifically  Cancer screening, Osteoporosis screening and Immunization.   Questions or concerns regarding consultations or procedures which the PT has had in the interim are  addressed. The PT denies any adverse reactions to current medications since the last visit.  There are no new concerns.  There are no specific complaints       Review of Systems Denies recent fever or chills. Denies sinus pressure, nasal congestion, ear pain or sore throat. Denies chest congestion, productive cough or wheezing. Denies chest pains, palpitations, paroxysmal nocturnal dyspnea, orthopnea and leg swelling Denies abdominal pain, nausea, vomiting,diarrhea or constipation.  Denies rectal bleeding or change in bowel movement. Denies dysuria, frequency, hesitancy or incontinence. Denies joint pain, swelling and limitation in mobility. Denies headaches, seizure, numbness, or tingling. Denies depression, anxiety or insomnia. Denies skin break down or rash.Chronic vitiligo unchanged        Objective:   Physical Exam Pleasant well nourished female, alert and oriented x 3, in no cardio-pulmonary distress. Afebrile. HEENT No facial trauma or asymetry.   EOMI, PERTL, fundoscopic exam is normal, no hemorhage or exudate. External ears normal, tympanic membranes clear. Oropharynx moist, no exudate, poor dentition. Neck: supple, no adenopathy,JVD or thyromegaly.No bruits.  Chest: Clear to ascultation bilaterally.No crackles or wheezes. Non tender to palpation  Breast: No asymetry,no masses. No nipple discharge or inversion. No axillary or supraclavicular adenopathy  Cardiovascular system; Heart sounds normal,  S1 and  S2  ,no S3.  No murmur, or thrill. Apical beat not displaced Peripheral pulses normal.  Abdomen: Soft, non tender, no organomegaly or masses. No bruits. Bowel sounds normal. No guarding, tenderness or rebound.  Rectal:  No mass. guaic negative stool.  GU: External genitalia normal. No lesions. Vaginal canal normal.No discharge. Uterus normal size, no adnexal masses, no cervical motion or adnexal tenderness.  Musculoskeletal exam: Decreased  ROM of spine, hips , shoulders and knees. No deformity ,swelling or crepitus noted. No muscle wasting or atrophy.   Neurologic: Cranial nerves 2 to 12 intact. Power, tone ,sensation and reflexes normal throughout. No disturbance in gait. No tremor.  Skin: Intact, no ulceration, erythema , scaling or rash noted. Pigmentation abnormal with hypopigmentation throughout, unchanged  Psych; Normal mood and affect. Judgement and concentration normal        Assessment & Plan:

## 2011-06-23 NOTE — Assessment & Plan Note (Signed)
Uncontrolled, pt has not taken her med on schedule, importance of same stressed

## 2011-06-23 NOTE — Assessment & Plan Note (Signed)
Unchanged, no specific management, has seen dermatology in the past

## 2011-06-23 NOTE — Assessment & Plan Note (Signed)
Low fat diet discussed and encouraged, needs updated lab data

## 2011-06-23 NOTE — Assessment & Plan Note (Signed)
Needs updated lab data

## 2011-06-23 NOTE — Assessment & Plan Note (Signed)
Improved and controlled on current medication, no change 

## 2011-07-31 ENCOUNTER — Other Ambulatory Visit: Payer: Self-pay | Admitting: Family Medicine

## 2011-08-01 LAB — LIPID PANEL: LDL Cholesterol: 73 mg/dL (ref 0–99)

## 2011-08-01 LAB — HEPATIC FUNCTION PANEL
Alkaline Phosphatase: 36 U/L — ABNORMAL LOW (ref 39–117)
Bilirubin, Direct: 0.3 mg/dL (ref 0.0–0.3)
Indirect Bilirubin: 0.1 mg/dL (ref 0.0–0.9)
Total Bilirubin: 0.4 mg/dL (ref 0.3–1.2)

## 2011-08-01 LAB — CBC WITH DIFFERENTIAL/PLATELET
Basophils Relative: 0 % (ref 0–1)
HCT: 32.1 % — ABNORMAL LOW (ref 36.0–46.0)
Hemoglobin: 11.1 g/dL — ABNORMAL LOW (ref 12.0–15.0)
MCHC: 34.6 g/dL (ref 30.0–36.0)
MCV: 87.2 fL (ref 78.0–100.0)
Monocytes Absolute: 0.4 10*3/uL (ref 0.1–1.0)
Monocytes Relative: 6 % (ref 3–12)
Neutro Abs: 4.3 10*3/uL (ref 1.7–7.7)

## 2011-08-01 LAB — BASIC METABOLIC PANEL
CO2: 29 mEq/L (ref 19–32)
Chloride: 101 mEq/L (ref 96–112)
Glucose, Bld: 79 mg/dL (ref 70–99)
Potassium: 3.9 mEq/L (ref 3.5–5.3)
Sodium: 139 mEq/L (ref 135–145)

## 2011-08-01 LAB — ANEMIA PANEL
Folate: >20 ng/mL
Retic Ct Pct: 1.1 % (ref 0.4–2.3)
UIBC: 232 ug/dL
Vitamin B-12: 319 pg/mL (ref 211–911)

## 2011-08-01 LAB — TSH: TSH: 0.447 u[IU]/mL (ref 0.350–4.500)

## 2011-08-27 ENCOUNTER — Telehealth: Payer: Self-pay | Admitting: Family Medicine

## 2011-08-27 NOTE — Telephone Encounter (Signed)
Taken care of

## 2011-09-27 LAB — BASIC METABOLIC PANEL
BUN: 11
BUN: 11
BUN: 12
BUN: 8
CO2: 28
Calcium: 8.7
Calcium: 8.8
Calcium: 8.9
Calcium: 9
Chloride: 102
Chloride: 104
Creatinine, Ser: 1.38 — ABNORMAL HIGH
Creatinine, Ser: 1.43 — ABNORMAL HIGH
Creatinine, Ser: 1.48 — ABNORMAL HIGH
GFR calc Af Amer: 44 — ABNORMAL LOW
GFR calc Af Amer: 44 — ABNORMAL LOW
GFR calc Af Amer: 53 — ABNORMAL LOW
GFR calc non Af Amer: 36 — ABNORMAL LOW
GFR calc non Af Amer: 37 — ABNORMAL LOW
GFR calc non Af Amer: 44 — ABNORMAL LOW
GFR calc non Af Amer: 44 — ABNORMAL LOW
Glucose, Bld: 95
Glucose, Bld: 95
Glucose, Bld: 97
Potassium: 3.4 — ABNORMAL LOW
Potassium: 3.5
Potassium: 3.8
Potassium: 3.9
Potassium: 3.9
Sodium: 135
Sodium: 136
Sodium: 136

## 2011-09-27 LAB — IRON AND TIBC
Iron: 42
Saturation Ratios: 18 — ABNORMAL LOW
TIBC: 229 — ABNORMAL LOW

## 2011-09-27 LAB — DIFFERENTIAL
Basophils Absolute: 0
Basophils Relative: 0
Eosinophils Absolute: 0.4
Eosinophils Relative: 5
Eosinophils Relative: 6 — ABNORMAL HIGH
Lymphocytes Relative: 21
Lymphs Abs: 1.5
Lymphs Abs: 1.6
Monocytes Absolute: 0.6
Monocytes Absolute: 0.7
Monocytes Relative: 9
Neutro Abs: 4.5
Neutrophils Relative %: 66

## 2011-09-27 LAB — CBC
HCT: 25.9 — ABNORMAL LOW
HCT: 26.4 — ABNORMAL LOW
HCT: 26.7 — ABNORMAL LOW
HCT: 26.9 — ABNORMAL LOW
HCT: 27.7 — ABNORMAL LOW
HCT: 27.9 — ABNORMAL LOW
Hemoglobin: 9 — ABNORMAL LOW
Hemoglobin: 9.4 — ABNORMAL LOW
MCHC: 33.9
MCHC: 35.6
MCV: 87.7
MCV: 88.3
MCV: 91.2
Platelets: 344
Platelets: 372
Platelets: 375
RBC: 2.97 — ABNORMAL LOW
RBC: 3.06 — ABNORMAL LOW
RBC: 3.16 — ABNORMAL LOW
RDW: 15.6 — ABNORMAL HIGH
RDW: 15.7 — ABNORMAL HIGH
RDW: 15.9 — ABNORMAL HIGH
WBC: 5.7
WBC: 6.6
WBC: 7.1
WBC: 7.1

## 2011-09-27 LAB — RETICULOCYTES: Retic Ct Pct: 1.2

## 2011-09-27 LAB — LIPID PANEL
LDL Cholesterol: 108 — ABNORMAL HIGH
Total CHOL/HDL Ratio: 7.1
Triglycerides: 165 — ABNORMAL HIGH
VLDL: 33

## 2011-09-27 LAB — POCT CARDIAC MARKERS
CKMB, poc: 1.9
Myoglobin, poc: 182
Myoglobin, poc: 190
Operator id: 1192
Operator id: 4531
Troponin i, poc: 0.07 — ABNORMAL HIGH

## 2011-09-27 LAB — CARDIAC PANEL(CRET KIN+CKTOT+MB+TROPI)
CK, MB: 0.9
CK, MB: 1
Total CK: 58
Total CK: 66
Troponin I: 0.02
Troponin I: 0.04

## 2011-09-27 LAB — VITAMIN B12: Vitamin B-12: 332 (ref 211–911)

## 2011-09-27 LAB — TSH: TSH: 0.902

## 2011-09-27 LAB — TROPONIN I: Troponin I: 0.04

## 2011-09-27 LAB — B-NATRIURETIC PEPTIDE (CONVERTED LAB): Pro B Natriuretic peptide (BNP): 128 — ABNORMAL HIGH

## 2011-09-27 LAB — PROTIME-INR: Prothrombin Time: 14.1

## 2011-09-30 LAB — COMPREHENSIVE METABOLIC PANEL
Albumin: 3.4 — ABNORMAL LOW
BUN: 8
Chloride: 106
Creatinine, Ser: 0.98
Total Bilirubin: 0.8

## 2011-09-30 LAB — DIFFERENTIAL
Basophils Absolute: 0
Basophils Relative: 0
Eosinophils Relative: 0
Lymphocytes Relative: 15
Lymphocytes Relative: 16
Lymphocytes Relative: 24
Lymphs Abs: 1.5
Lymphs Abs: 2
Monocytes Absolute: 0.5
Monocytes Absolute: 0.6
Monocytes Relative: 5
Neutro Abs: 5.5
Neutro Abs: 9.5 — ABNORMAL HIGH
Neutrophils Relative %: 67
Neutrophils Relative %: 78 — ABNORMAL HIGH

## 2011-09-30 LAB — BASIC METABOLIC PANEL
BUN: 7
CO2: 25
Chloride: 103
Chloride: 109
GFR calc non Af Amer: 44 — ABNORMAL LOW
Glucose, Bld: 111 — ABNORMAL HIGH
Glucose, Bld: 121 — ABNORMAL HIGH
Potassium: 3.8
Potassium: 4
Sodium: 133 — ABNORMAL LOW

## 2011-09-30 LAB — CBC
HCT: 28.4 — ABNORMAL LOW
HCT: 29.7 — ABNORMAL LOW
HCT: 29.7 — ABNORMAL LOW
Hemoglobin: 10 — ABNORMAL LOW
Hemoglobin: 10.3 — ABNORMAL LOW
MCV: 89
MCV: 89.2
Platelets: 300
RDW: 16 — ABNORMAL HIGH
RDW: 16 — ABNORMAL HIGH
RDW: 16.2 — ABNORMAL HIGH
WBC: 8.2
WBC: 9.7

## 2011-09-30 LAB — TYPE AND SCREEN: Antibody Screen: NEGATIVE

## 2011-09-30 LAB — POCT I-STAT 4, (NA,K, GLUC, HGB,HCT)
Operator id: 107181
Potassium: 4.4
Sodium: 141

## 2011-09-30 LAB — POCT CARDIAC MARKERS
CKMB, poc: 1.9
Myoglobin, poc: 105
Myoglobin, poc: 114
Operator id: 1192
Troponin i, poc: <0.05

## 2011-10-17 ENCOUNTER — Ambulatory Visit: Payer: Medicare Other | Admitting: Family Medicine

## 2011-10-17 ENCOUNTER — Ambulatory Visit (INDEPENDENT_AMBULATORY_CARE_PROVIDER_SITE_OTHER): Payer: Medicare Other

## 2011-10-17 ENCOUNTER — Encounter: Payer: Self-pay | Admitting: Family Medicine

## 2011-10-17 DIAGNOSIS — Z23 Encounter for immunization: Secondary | ICD-10-CM

## 2011-10-24 ENCOUNTER — Telehealth: Payer: Self-pay | Admitting: Family Medicine

## 2011-11-01 NOTE — Telephone Encounter (Signed)
Will forward to Manager

## 2011-11-06 ENCOUNTER — Telehealth: Payer: Self-pay | Admitting: Family Medicine

## 2011-11-08 ENCOUNTER — Telehealth: Payer: Self-pay | Admitting: Family Medicine

## 2011-11-08 NOTE — Telephone Encounter (Signed)
Patient is aware of request to [facility] to verify charges with her insurance.

## 2011-11-11 ENCOUNTER — Encounter: Payer: Self-pay | Admitting: Family Medicine

## 2011-11-19 ENCOUNTER — Ambulatory Visit (INDEPENDENT_AMBULATORY_CARE_PROVIDER_SITE_OTHER): Payer: Medicare Other | Admitting: Family Medicine

## 2011-11-19 ENCOUNTER — Encounter: Payer: Self-pay | Admitting: Family Medicine

## 2011-11-19 VITALS — BP 140/60 | HR 62 | Resp 16 | Ht 65.0 in | Wt 201.1 lb

## 2011-11-19 DIAGNOSIS — R5383 Other fatigue: Secondary | ICD-10-CM

## 2011-11-19 DIAGNOSIS — E039 Hypothyroidism, unspecified: Secondary | ICD-10-CM

## 2011-11-19 DIAGNOSIS — Z0100 Encounter for examination of eyes and vision without abnormal findings: Secondary | ICD-10-CM

## 2011-11-19 DIAGNOSIS — I10 Essential (primary) hypertension: Secondary | ICD-10-CM

## 2011-11-19 DIAGNOSIS — E669 Obesity, unspecified: Secondary | ICD-10-CM

## 2011-11-19 DIAGNOSIS — R7301 Impaired fasting glucose: Secondary | ICD-10-CM

## 2011-11-19 DIAGNOSIS — E785 Hyperlipidemia, unspecified: Secondary | ICD-10-CM

## 2011-11-19 NOTE — Progress Notes (Signed)
Subjective:    Patient ID: Melissa [name], female    DOB: August 11, 1946, 65 y.o.   MRN: [id number]  HPI Pt in for annual wellness visit. History is updated and reviewed. Pt has completed from provided at the visit in part, the rest is filled in by me. And the entire form is reviewed in detail In summary, she is up to date with cancer screening and immunization with the exception of the needing an eye exam. She does have minimal impairment in hearing but insufficient for formal testing for hearing aids, espescialy at the prohibitive cost She has no limitations in activities of daily living and is safe in her home environment, not considered at high fall risk. A copy of the completed form is scanned into her chart   Review of Systems See HPI Denies recent fever or chills. Denies sinus pressure, nasal congestion, ear pain or sore throat. Denies chest congestion, productive cough or wheezing. Denies chest pains, palpitations and leg swelling Denies abdominal pain, nausea, vomiting,diarrhea or constipation.   Denies dysuria, frequency, hesitancy or incontinence. Denies headaches, seizures, numbness, or tingling. Denies depression, anxiety or insomnia. Denies skin break down or rash.        Objective:   Physical Exam  Patient alert and oriented and in no cardiopulmonary distress.  HEENT: No facial asymmetry, EOMI, no sinus tenderness,  oropharynx pink and moist.  Neck supple no adenopathy.  Chest: Clear to auscultation bilaterally.  CVS: S1, S2 no murmurs, no S3.  ABD: Soft non tender. Bowel sounds normal.  Ext: No edema  MS: Adequate ROM spine, shoulders, hips and knees.  Skin: Intact, no ulcerations or rash noted.  Psych: Good eye contact, normal affect. Memory intact not anxious or depressed appearing.  CNS: CN 2-12 intact, power, tone and sensation normal throughout.       Assessment & Plan:

## 2011-11-19 NOTE — Patient Instructions (Addendum)
F/u in 4 months.  HBA1C today.  Fasting lipid, cmp  And HBa1C in 4 months BEFORE next visit.   You need to call and register and attend a class at the hospital about diabetes and how to eat healthier to control and lower blood sugar. We will also provide info from the office   You will be referred for eye exam  Please commit to exercising at least 30 minutes every day, ride the bike!

## 2011-11-25 NOTE — Assessment & Plan Note (Signed)
Controlled, no change in medication  

## 2011-11-25 NOTE — Assessment & Plan Note (Signed)
Improved. Pt applauded on succesful weight loss through lifestyle change, and encouraged to continue same. Weight loss goal set for the next several months.  

## 2011-11-25 NOTE — Assessment & Plan Note (Signed)
Sub optimal control, no med change at this time, lifestyle change only

## 2011-11-25 NOTE — Assessment & Plan Note (Signed)
Controlled, no change in medication Hyperlipidemia:Low fat diet discussed and encouraged.  \ 

## 2011-12-02 ENCOUNTER — Telehealth: Payer: Self-pay | Admitting: Family Medicine

## 2011-12-03 ENCOUNTER — Other Ambulatory Visit: Payer: Self-pay | Admitting: Family Medicine

## 2011-12-04 ENCOUNTER — Telehealth: Payer: Self-pay | Admitting: Family Medicine

## 2011-12-04 MED ORDER — MECLIZINE HCL 25 MG PO TABS: 25.0000 mg | ORAL_TABLET | Freq: Every day | ORAL | Status: DC

## 2011-12-04 NOTE — Telephone Encounter (Signed)
Medicine sent in

## 2011-12-05 ENCOUNTER — Telehealth (HOSPITAL_COMMUNITY): Payer: Self-pay | Admitting: Dietician

## 2011-12-19 ENCOUNTER — Other Ambulatory Visit: Payer: Self-pay

## 2012-01-16 NOTE — Telephone Encounter (Signed)
Pt was a no-show for diabetes class scheduled 12/05/11.

## 2012-01-20 ENCOUNTER — Telehealth: Payer: Self-pay | Admitting: Family Medicine

## 2012-01-21 ENCOUNTER — Ambulatory Visit (INDEPENDENT_AMBULATORY_CARE_PROVIDER_SITE_OTHER): Payer: Medicare Other | Admitting: Family Medicine

## 2012-01-21 ENCOUNTER — Encounter: Payer: Self-pay | Admitting: Family Medicine

## 2012-01-21 VITALS — BP 132/74 | HR 64 | Resp 18 | Ht 65.0 in | Wt 233.1 lb

## 2012-01-21 DIAGNOSIS — J019 Acute sinusitis, unspecified: Secondary | ICD-10-CM

## 2012-01-21 DIAGNOSIS — M25559 Pain in unspecified hip: Secondary | ICD-10-CM | POA: Insufficient documentation

## 2012-01-21 MED ORDER — METHYLPREDNISOLONE ACETATE 40 MG/ML IJ SUSP
40.0000 mg | Freq: Once | INTRAMUSCULAR | Status: AC
  Administered 2012-01-21: 40 mg via INTRAMUSCULAR

## 2012-01-21 MED ORDER — AMOXICILLIN 500 MG PO CAPS: 500.0000 mg | ORAL_CAPSULE | Freq: Two times a day (BID) | ORAL | Status: AC

## 2012-01-21 MED ORDER — MELOXICAM 15 MG PO TABS: 15.0000 mg | ORAL_TABLET | Freq: Every day | ORAL | Status: DC

## 2012-01-21 MED ORDER — KETOROLAC TROMETHAMINE 60 MG/2ML IJ SOLN
60.0000 mg | Freq: Once | INTRAMUSCULAR | Status: AC
  Administered 2012-01-21: 60 mg via INTRAMUSCULAR

## 2012-01-21 NOTE — Patient Instructions (Signed)
For your hip- you have been given a shot of steroids and pain medication Take the antiinflammatory medication Meloxicam daily with food for the next 4 weeks If you are not improved in 2 weeks then call and I will send you for an Xray of your hip For your sinuses- take the antibiotic as prescribed.  Pick up nasal saline spray for your nose- this helps thin the mucous

## 2012-01-21 NOTE — Telephone Encounter (Signed)
Has called again she suffered again last night please call her

## 2012-01-21 NOTE — Telephone Encounter (Signed)
Left hip pain. On a scale of 1-10 lastnight was a 10. Hurts so bad she can't pin point exact area, Its in the upper area. Coming in for OV today

## 2012-01-21 NOTE — Assessment & Plan Note (Signed)
Differentials include arthritis, vs trochanteric bursitis Treat with NSAIDS x 4 weeks, pt to call in 2 weeks if not improved x-ray of hip Steroid injection and toradol given No red flags

## 2012-01-21 NOTE — Progress Notes (Signed)
Addended by: Kandis Fantasia B on: 01/21/2012 04:52 PM   Modules accepted: Orders

## 2012-01-21 NOTE — Assessment & Plan Note (Signed)
Treat based on duration of symptoms, Amox x 10 days, nasal saline for thick mucous Steroid injection in clinic for both hip pain and sinus infection

## 2012-01-21 NOTE — Progress Notes (Signed)
Subjective:    Patient ID: Melissa [name], female    DOB: 11/13/1946, 66 y.o.   MRN: [id number]  HPI   Hip pain- left hip pain for the past few days. Patient has had excruciating pain on the side of her leg which radiates to her groin and sometimes down the leg. This awakens her from sleep. She states the pain is sharp and tight. She denies any new. Speech is in her lower extremities occasionally has tingling in her feet which has not changed. She denies any recent injury. 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 She denies any pain with walking, sitting. Currently she is not in any pain  Nasal congestion- nasal congestion and sinus pressure for the past month. She has tried some home remedies but no over-the-counter medication secondary to her high blood pressure. She denies any fever or chills the discharge is very thick and often she has to cough this up especially in the morning time. She's been trying to increase her fluid intake.   Review of Systems  GEN- denies fatigue, fever, weight loss,weakness,+ recent illness HEENT- denies eye drainage, change in vision,+ nasal discharge, CVS- denies chest pain, palpitations RESP- denies SOB, cough, wheeze ABD- denies N/V, change in stools, abd pain MSK- + joint pain, muscle aches, injury        Objective:   Physical Exam GEN- NAD, alert and oriented x3, obese HEENT- PERRL, EOMI, non injected sclera, pink conjunctiva, MMM, oropharynx clear,nares clear rhinorrhea, +sinus pressure,  Neck- Supple, no LAD CVS- RRR, no murmur RESP-CTAB, no wheeze, +upper airway congestion Hip- mild discomfort with palpation over left great trochanter, no pain with IR/ER bialt, neg FABER, hip adductors weak bilat, strength equal bilat lower ext, neg SLR, neg Hip rock EXT- No edema Pulses- Radial, DP- 2+ Gait- normal, able to walk on toes and heels, able to squat able to flex and extend at back        Assessment &  Plan:

## 2012-01-22 ENCOUNTER — Emergency Department (HOSPITAL_COMMUNITY)
Admission: EM | Admit: 2012-01-22 | Discharge: 2012-01-22 | Disposition: A | Discharge status: Home / Self Care | Payer: Medicare Other | Attending: Emergency Medicine | Admitting: Emergency Medicine

## 2012-01-22 ENCOUNTER — Emergency Department (HOSPITAL_COMMUNITY): Payer: Medicare Other

## 2012-01-22 ENCOUNTER — Encounter (HOSPITAL_COMMUNITY): Payer: Self-pay | Admitting: *Deleted

## 2012-01-22 DIAGNOSIS — M1990 Unspecified osteoarthritis, unspecified site: Secondary | ICD-10-CM

## 2012-01-22 DIAGNOSIS — M51379 Other intervertebral disc degeneration, lumbosacral region without mention of lumbar back pain or lower extremity pain: Secondary | ICD-10-CM | POA: Insufficient documentation

## 2012-01-22 DIAGNOSIS — M5136 Other intervertebral disc degeneration, lumbar region: Secondary | ICD-10-CM

## 2012-01-22 DIAGNOSIS — M5030 Other cervical disc degeneration, unspecified cervical region: Secondary | ICD-10-CM | POA: Insufficient documentation

## 2012-01-22 DIAGNOSIS — Z7982 Long term (current) use of aspirin: Secondary | ICD-10-CM | POA: Insufficient documentation

## 2012-01-22 DIAGNOSIS — I10 Essential (primary) hypertension: Secondary | ICD-10-CM | POA: Insufficient documentation

## 2012-01-22 DIAGNOSIS — M5137 Other intervertebral disc degeneration, lumbosacral region: Secondary | ICD-10-CM | POA: Insufficient documentation

## 2012-01-22 DIAGNOSIS — E039 Hypothyroidism, unspecified: Secondary | ICD-10-CM | POA: Insufficient documentation

## 2012-01-22 DIAGNOSIS — E785 Hyperlipidemia, unspecified: Secondary | ICD-10-CM | POA: Insufficient documentation

## 2012-01-22 DIAGNOSIS — Z79899 Other long term (current) drug therapy: Secondary | ICD-10-CM | POA: Insufficient documentation

## 2012-01-22 DIAGNOSIS — Z85528 Personal history of other malignant neoplasm of kidney: Secondary | ICD-10-CM | POA: Insufficient documentation

## 2012-01-22 DIAGNOSIS — E669 Obesity, unspecified: Secondary | ICD-10-CM | POA: Insufficient documentation

## 2012-01-22 DIAGNOSIS — M25559 Pain in unspecified hip: Secondary | ICD-10-CM | POA: Insufficient documentation

## 2012-01-22 HISTORY — DX: Other cervical disc degeneration, unspecified cervical region: M50.30

## 2012-01-22 HISTORY — DX: Other intervertebral disc degeneration, lumbar region: M51.36

## 2012-01-22 HISTORY — DX: Other intervertebral disc degeneration, lumbar region without mention of lumbar back pain or lower extremity pain: M51.369

## 2012-01-22 MED ORDER — HYDROCODONE-ACETAMINOPHEN 5-325 MG PO TABS
1.0000 | ORAL_TABLET | Freq: Once | ORAL | Status: AC
  Administered 2012-01-22: 1 via ORAL

## 2012-01-22 MED ORDER — HYDROCODONE-ACETAMINOPHEN 5-325 MG PO TABS
ORAL_TABLET | ORAL | Status: AC
  Administered 2012-01-22: 1 via ORAL
  Filled 2012-01-22: qty 1

## 2012-01-22 MED ORDER — HYDROCODONE-ACETAMINOPHEN 5-325 MG PO TABS: ORAL_TABLET | ORAL | Status: AC

## 2012-01-22 NOTE — ED Notes (Signed)
Left in c/o family for transport home.  Denies pain; in no distress; instructions and Rx reviewed-verbalizes understanding.

## 2012-01-22 NOTE — ED Notes (Addendum)
C/o left hip/buttock pain x 4 days; denies injury. Was seen at PCP office yesterday and was given "two shots"; reports pain has not improved.

## 2012-01-22 NOTE — ED Provider Notes (Signed)
History     CSN: [id number]  Arrival date & time 01/22/12  0227   Chief Complaint  Patient presents with  . Hip Pain    left     HPI Pt was seen at 0305.  Per pt, c/o gradual onset and persistence of constant left side low back and hip "pain" for the past several days.  Pt was eval by her PMD for same today, was given a "steroid shot" and rx NSAID without relief.  Denies fall/injury, no focal motor weakness, no tingling/numbness in extremities, no saddle anesthesia, no incont/retention of bowel or bladder, no fevers, no rash, no N/V/D, no dysuria, no flank pain.     Past Medical History  Diagnosis Date  . Vertigo, intermittent   . Hypothyroidism   . Dyslipidemia   . Obesity   . Hypertension   . Malignant neoplasm of kidney, except pelvis   . Vitiligo   . Degenerative disc disease, lumbar   . Degenerative disc disease, cervical     Past Surgical History  Procedure Date  . Btl 1983  . Right nephrectomy due to renal cell cancer     Family History  Problem Relation Age of Onset  . Heart attack Mother   . Hypertension Mother   . Colon cancer Father   . Hypertension Sister   . Hypertension Sister   . Diabetes Sister   . Diabetes Sister   . Heart failure Sister   . Lupus Brother     History  Substance Use Topics  . Smoking status: Never Smoker   . Smokeless tobacco: Not on file  . Alcohol Use: No    Review of Systems ROS: Statement: All systems negative except as marked or noted in the HPI; Constitutional: Negative for fever and chills. ; ; Eyes: Negative for eye pain, redness and discharge. ; ; ENMT: Negative for ear pain, hoarseness, nasal congestion, sinus pressure and sore throat. ; ; Cardiovascular: Negative for chest pain, palpitations, diaphoresis, dyspnea and peripheral edema. ; ; Respiratory: Negative for cough, wheezing and stridor. ; ; Gastrointestinal: Negative for nausea, vomiting, diarrhea, abdominal pain, blood in stool, hematemesis, jaundice and  rectal bleeding. . ; ; Genitourinary: Negative for dysuria, flank pain and hematuria. ; ; Musculoskeletal: Negative for back pain and neck pain. Negative for swelling and trauma. +left low back and hip pain.; ; Skin: Negative for pruritus, rash, abrasions, blisters, bruising and skin lesion.; ; Neuro: Negative for headache, lightheadedness and neck stiffness. Negative for weakness, altered level of consciousness , altered mental status, extremity weakness, paresthesias, involuntary movement, seizure and syncope.       Allergies  Morphine  Home Medications   Current Outpatient Rx  Name Route Sig Dispense Refill  . AMLODIPINE BESYLATE PO Oral Take 10 mg by mouth daily. One tab by mouth once daily    . AMOXICILLIN 500 MG PO CAPS Oral Take 1 capsule (500 mg total) by mouth 2 (two) times daily. 20 capsule 0  . ASPIRIN 81 MG PO TBEC Oral Take 81 mg by mouth daily. One tab by mouth once daily     . CALCIUM-VITAMIN D 500 MG PO TABS Oral Take 1 tablet by mouth 3 (three) times daily. Take 1 tablet by mouth three times a day     . CHLORTHALIDONE 25 MG PO TABS Oral Take 25 mg by mouth daily.      Marland Kitchen CLOBETASOL PROPIONATE 0.05 % EX OINT      . FLUOXETINE HCL 10 MG PO  TABS Oral Take 1 tablet (10 mg total) by mouth daily. Take 1 tablet by mouth once a day 90 tablet 1  . HYDRALAZINE HCL 25 MG PO TABS Oral Take 1 tablet (25 mg total) by mouth 3 (three) times daily. Take two tablets by mouth three times a day 180 tablet 3  . LEVOTHYROXINE SODIUM 100 MCG PO TABS Oral Take 1 tablet (100 mcg total) by mouth daily. 90 tablet 1  . LISINOPRIL 40 MG PO TABS Oral Take 40 mg by mouth daily.      Marland Kitchen LOVASTATIN 40 MG PO TABS Oral Take 1 tablet (40 mg total) by mouth at bedtime. 90 tablet 1  . MECLIZINE HCL 25 MG PO TABS Oral Take 1 tablet (25 mg total) by mouth daily. One daily as needed for vertigo 30 tablet 0  . MELOXICAM 15 MG PO TABS Oral Take 1 tablet (15 mg total) by mouth daily. 30 tablet 0  . ONE-A-DAY EXTRAS  ANTIOXIDANT PO Oral Take by mouth. Take one tablet by mouth     . EQL FISH OIL 1000 MG PO CAPS Oral Take by mouth. Take 1 tablet  by mouth two times a day     . ZOLPIDEM TARTRATE 10 MG PO TABS Oral Take 10 mg by mouth at bedtime as needed. One tablet by mouth at bedtime       BP 184/76  Pulse 62  Temp(Src) 99 F (37.2 C) (Oral)  Resp 20  Ht 5\' 6"  (1.676 m)  Wt 230 lb (104.327 kg)  BMI 37.12 kg/m2  SpO2 100%  Physical Exam 0310: Physical examination:  Nursing notes reviewed; Vital signs and O2 SAT reviewed;  Constitutional: Well developed, Well nourished, Well hydrated, In no acute distress; Head:  Normocephalic, atraumatic; Eyes: EOMI, PERRL, No scleral icterus; ENMT: Mouth and pharynx normal, Mucous membranes moist; Neck: Supple, Full range of motion, No lymphadenopathy; Cardiovascular: Regular rate and rhythm, No murmur, rub, or gallop; Respiratory: Breath sounds clear & equal bilaterally, No rales, rhonchi, wheezes, or rub, Normal respiratory effort/excursion; Chest: Nontender, Movement normal; Abdomen: Soft, Nontender, Nondistended, Normal bowel sounds; Genitourinary: No CVA tenderness; Spine:  No midline CS, TS, LS tenderness. +TTP left lower lumbar paraspinal muscles and SI joint areas. No rash.; Extremities: Pulses normal, No tenderness, No edema, No calf edema or asymmetry, pelvis stable, no deformity..; Neuro: AA&Ox3, Major CN grossly intact.  No gross focal motor or sensory deficits in extremities.; Skin: Color normal, Warm, Dry, no rash.    ED Course  Procedures   MDM  MDM Reviewed: previous chart, nursing note and vitals Reviewed previous: MRI and x-ray Interpretation: x-ray     Dg Lumbar Spine Complete 01/22/2012  *RADIOLOGY REPORT*  Clinical Data: Left hip pain for 2 days.  No injury.  Shots of the hip on 01/21/2012  LUMBAR SPINE - COMPLETE 4+ VIEW  Comparison: 05/29/2009  Findings: Five lumbar type vertebrae.  Normal alignment of the lumbar vertebrae.  Diffuse  degenerative changes with narrowed lumbar interspaces and assisted endplate hypertrophic changes. Degenerative disc disease of the lumbar - sacral interspace and at L3-4 and L4-5.  No vertebral compression deformities.  No focal bone lesion or bone destruction.  Vascular calcifications.  No significant change since the previous study.  IMPRESSION: No displaced fractures identified.  Degenerative changes in the lumbar spine.  Original Report Authenticated By: Marlon Pel, M.D.   Dg Hip Complete Left 01/22/2012  *RADIOLOGY REPORT*  Clinical Data: Left hip pain for 2 days.  No  injury.  LEFT HIP - COMPLETE 2+ VIEW  Comparison: None.  Findings: Mild hypertrophic degenerative changes in the hips.  The pelvis and hips appear otherwise intact.  No evidence of acute fracture or subluxation.  No focal bone lesion or bone destruction. No abnormal periosteal reaction.  Bone cortex and trabecular architecture appear intact.  No radiopaque foreign bodies in the soft tissues.  IMPRESSION: Mild degenerative changes in the hips.  No evidence of acute fracture or subluxation.  Original Report Authenticated By: Marlon Pel, M.D.     4:32 AM:  No fx, multiple degenerative changes on XR's.  Pt has climbed on and off stretcher/x-ray table by herself without difficulty.  Requesting "something stronger I can take for the pain."  (PMD rx mobic.)  Wants to go home now.  Dx testing d/w pt and family.  Questions answered.  Verb understanding, agreeable to d/c home with outpt f/u.      Tracie Lindbloom Allison Quarry, DO 01/24/12 [id number]

## 2012-02-14 NOTE — Telephone Encounter (Signed)
Pt aware.

## 2012-03-21 LAB — COMPLETE METABOLIC PANEL WITH GFR
ALT: 10 U/L (ref 0–35)
AST: 15 U/L (ref 0–37)
Creat: 1.13 mg/dL — ABNORMAL HIGH (ref 0.50–1.10)
Total Bilirubin: 0.4 mg/dL (ref 0.3–1.2)

## 2012-03-21 LAB — LIPID PANEL
HDL: 34 mg/dL — ABNORMAL LOW (ref >39)
LDL Cholesterol: 53 mg/dL (ref 0–99)
Triglycerides: 140 mg/dL (ref <150)
VLDL: 28 mg/dL (ref 0–40)

## 2012-03-22 LAB — HEMOGLOBIN A1C: Mean Plasma Glucose: 111 mg/dL (ref <117)

## 2012-03-23 ENCOUNTER — Ambulatory Visit (INDEPENDENT_AMBULATORY_CARE_PROVIDER_SITE_OTHER): Payer: Medicare Other | Admitting: Family Medicine

## 2012-03-23 ENCOUNTER — Encounter: Payer: Self-pay | Admitting: Family Medicine

## 2012-03-23 VITALS — BP 150/70 | HR 69 | Resp 18 | Ht 65.0 in | Wt 237.1 lb

## 2012-03-23 DIAGNOSIS — E669 Obesity, unspecified: Secondary | ICD-10-CM

## 2012-03-23 DIAGNOSIS — E785 Hyperlipidemia, unspecified: Secondary | ICD-10-CM

## 2012-03-23 DIAGNOSIS — E039 Hypothyroidism, unspecified: Secondary | ICD-10-CM

## 2012-03-23 DIAGNOSIS — B351 Tinea unguium: Secondary | ICD-10-CM

## 2012-03-23 DIAGNOSIS — R0609 Other forms of dyspnea: Secondary | ICD-10-CM

## 2012-03-23 DIAGNOSIS — N3000 Acute cystitis without hematuria: Secondary | ICD-10-CM

## 2012-03-23 DIAGNOSIS — I10 Essential (primary) hypertension: Secondary | ICD-10-CM

## 2012-03-23 DIAGNOSIS — R51 Headache: Secondary | ICD-10-CM

## 2012-03-23 DIAGNOSIS — R519 Headache, unspecified: Secondary | ICD-10-CM | POA: Insufficient documentation

## 2012-03-23 DIAGNOSIS — E876 Hypokalemia: Secondary | ICD-10-CM | POA: Insufficient documentation

## 2012-03-23 LAB — POCT URINALYSIS DIPSTICK
Bilirubin, UA: NEGATIVE
Glucose, UA: NEGATIVE
Ketones, UA: NEGATIVE
Nitrite, UA: POSITIVE
pH, UA: 7

## 2012-03-23 MED ORDER — CIPROFLOXACIN HCL 500 MG PO TABS: 500.0000 mg | ORAL_TABLET | Freq: Two times a day (BID) | ORAL | Status: AC

## 2012-03-23 MED ORDER — TERBINAFINE HCL 250 MG PO TABS: 250.0000 mg | ORAL_TABLET | Freq: Every day | ORAL | Status: DC

## 2012-03-23 MED ORDER — POTASSIUM CHLORIDE CRYS ER 20 MEQ PO TBCR: 20.0000 meq | EXTENDED_RELEASE_TABLET | Freq: Every day | ORAL | Status: DC

## 2012-03-23 NOTE — Progress Notes (Signed)
Subjective:    Patient ID: Melissa [name], female    DOB: 1946-03-09, 66 y.o.   MRN: [id number]  HPI The PT is here for follow up and re-evaluation of chronic medical conditions, medication management and review of any available recent lab and radiology data.  Preventive health is updated, specifically  Cancer screening and Immunization.   Questions or concerns regarding consultations or procedures which the PT has had in the interim are  addressed. The PT denies any adverse reactions to current medications since the last visit.  5 week h/o sharp shooting headaches duration less than 1 minute, no other neurologic symptoms, has recently had eye exam Sleep is fair, depression adequately controlled. On avg twice per week she experiences sharp lancinating pains in the head  Increased exertional fatigue x 1 year, currently in bP study at [facility]. Blood pressure remains uncontrolled. Denies chest discomfort or palpitations Hyperpigmentation and thickening of toenails on right foot only , worsening over months       Review of Systems See HPI Denies recent fever or chills. Denies sinus pressure, nasal congestion, ear pain or sore throat. Denies chest congestion, productive cough or wheezing. Denies chest pains, palpitations and leg swelling Denies abdominal pain, nausea, vomiting,diarrhea or constipation.   1 week h/ mild  Dysuria and  frequency Denies joint pain, swelling and limitation in mobility. Denies seizures, numbness, or tingling. Denies depression, anxiety or insomnia.      Objective:   Physical Exam Patient alert and oriented and in no cardiopulmonary distress.  HEENT: No facial asymmetry, EOMI, no sinus tenderness,  oropharynx pink and moist.  Neck supple no adenopathy.  Chest: Clear to auscultation bilaterally.  CVS: S1, S2 no murmurs, no S3.  ABD: Soft non tender. Bowel sounds normal.  Ext: No edema  MS: Adequate ROM spine, shoulders, hips and knees.  Skin:  Intact, no ulcerations or rash noted.  Psych: Good eye contact, normal affect. Memory intact not anxious or depressed appearing.  CNS: CN 2-12 intact, power, tone and sensation normal throughout.        Assessment & Plan:

## 2012-03-23 NOTE — Patient Instructions (Signed)
F/u in 4.5 month  You are referred to cardiology due to worsening exertional fatigue.  You appear to have a urinary infection, antibiotics are prescribed. Potassium was slightly low, start one tablet once daily.  Medication is prescribed for 12 weeks total for nail infection of right foot, if not improved will refer to dermatology.  Call if headaches worsen for referral.  Please work consistently on lifestyle change to promote weight loss, you need to

## 2012-03-23 NOTE — Assessment & Plan Note (Signed)
Deteriorated. Patient re-educated about  the importance of commitment to a  minimum of 150 minutes of exercise per week. The importance of healthy food choices with portion control discussed. Encouraged to start a food diary, count calories and to consider  joining a support group. Sample diet sheets offered. Goals set by the patient for the next several months.    

## 2012-03-23 NOTE — Assessment & Plan Note (Signed)
6 month h/o worsening symptoms will refer to card

## 2012-03-23 NOTE — Assessment & Plan Note (Signed)
Abn [facility] will treat and culture

## 2012-03-23 NOTE — Assessment & Plan Note (Addendum)
Uncontrolled, currently in a study in [place] re BP management, medication being adjusted there

## 2012-03-23 NOTE — Assessment & Plan Note (Signed)
New onset of lancinating headache intermittent, neurologic exam normal. Pt to call if symptoms worsen

## 2012-03-23 NOTE — Assessment & Plan Note (Signed)
Pt needs potassium supplement based on recent labs , same prescribed

## 2012-03-31 ENCOUNTER — Encounter (HOSPITAL_COMMUNITY): Payer: Self-pay | Admitting: Dietician

## 2012-03-31 NOTE — Progress Notes (Signed)
[facility] Diabetes Class Completion  Date:March 31, 2012  Time: 10:00 AM  Pt attended [facility]'s Diabetes Class on March 31, 2012.   Patient was educated on the following topics: carbohydrate metabolism in relation to diabetes, sources of carbohydrate, carbohydrate counting, meal planning strategies, food label reading, and portion control.   Ashden Sonnenberg A. Kayan, RD, LDN Date:March 31, 2012 Time: 10:00 AM 

## 2012-04-12 ENCOUNTER — Encounter: Payer: Self-pay | Admitting: Cardiology

## 2012-04-12 DIAGNOSIS — Z85528 Personal history of other malignant neoplasm of kidney: Secondary | ICD-10-CM | POA: Insufficient documentation

## 2012-04-12 DIAGNOSIS — E785 Hyperlipidemia, unspecified: Secondary | ICD-10-CM | POA: Insufficient documentation

## 2012-04-12 DIAGNOSIS — I10 Essential (primary) hypertension: Secondary | ICD-10-CM | POA: Insufficient documentation

## 2012-04-12 DIAGNOSIS — R079 Chest pain, unspecified: Secondary | ICD-10-CM | POA: Insufficient documentation

## 2012-04-13 ENCOUNTER — Encounter: Payer: Self-pay | Admitting: Cardiology

## 2012-04-13 ENCOUNTER — Ambulatory Visit (INDEPENDENT_AMBULATORY_CARE_PROVIDER_SITE_OTHER): Payer: Medicare Other | Admitting: Cardiology

## 2012-04-13 ENCOUNTER — Ambulatory Visit (HOSPITAL_COMMUNITY)
Admission: RE | Admit: 2012-04-13 | Discharge: 2012-04-13 | Disposition: A | Discharge status: Home / Self Care | Payer: Medicare Other | Source: Ambulatory Visit | Attending: Cardiology | Admitting: Cardiology

## 2012-04-13 VITALS — BP 135/74 | HR 71 | Resp 16 | Ht 65.0 in | Wt 234.0 lb

## 2012-04-13 DIAGNOSIS — M5380 Other specified dorsopathies, site unspecified: Secondary | ICD-10-CM | POA: Insufficient documentation

## 2012-04-13 DIAGNOSIS — E785 Hyperlipidemia, unspecified: Secondary | ICD-10-CM

## 2012-04-13 DIAGNOSIS — R42 Dizziness and giddiness: Secondary | ICD-10-CM

## 2012-04-13 DIAGNOSIS — R0989 Other specified symptoms and signs involving the circulatory and respiratory systems: Secondary | ICD-10-CM | POA: Insufficient documentation

## 2012-04-13 DIAGNOSIS — I10 Essential (primary) hypertension: Secondary | ICD-10-CM

## 2012-04-13 DIAGNOSIS — R0609 Other forms of dyspnea: Secondary | ICD-10-CM

## 2012-04-13 DIAGNOSIS — R079 Chest pain, unspecified: Secondary | ICD-10-CM | POA: Insufficient documentation

## 2012-04-13 DIAGNOSIS — I517 Cardiomegaly: Secondary | ICD-10-CM | POA: Insufficient documentation

## 2012-04-13 DIAGNOSIS — H9190 Unspecified hearing loss, unspecified ear: Secondary | ICD-10-CM

## 2012-04-13 DIAGNOSIS — E669 Obesity, unspecified: Secondary | ICD-10-CM

## 2012-04-13 DIAGNOSIS — R0600 Dyspnea, unspecified: Secondary | ICD-10-CM

## 2012-04-13 DIAGNOSIS — R918 Other nonspecific abnormal finding of lung field: Secondary | ICD-10-CM | POA: Insufficient documentation

## 2012-04-13 DIAGNOSIS — E559 Vitamin D deficiency, unspecified: Secondary | ICD-10-CM

## 2012-04-13 LAB — COMPREHENSIVE METABOLIC PANEL
ALT: 15 U/L (ref 0–35)
AST: 18 U/L (ref 0–37)
Albumin: 3.8 g/dL (ref 3.5–5.2)
CO2: 31 mEq/L (ref 19–32)
Calcium: 9.8 mg/dL (ref 8.4–10.5)
Chloride: 100 mEq/L (ref 96–112)
Creat: 1.23 mg/dL — ABNORMAL HIGH (ref 0.50–1.10)
Potassium: 3.3 mEq/L — ABNORMAL LOW (ref 3.5–5.3)
Sodium: 137 mEq/L (ref 135–145)
Total Protein: 8.8 g/dL — ABNORMAL HIGH (ref 6.0–8.3)

## 2012-04-13 NOTE — Patient Instructions (Signed)
Your physician recommends that you schedule a follow-up appointment in: 2 month follow up  Increase Activity as much as possible  Your physician recommends that you return for lab work in: Today  A chest x-ray takes a picture of the organs and structures inside the chest, including the heart, lungs, and blood vessels. This test can show several things, including, whether the heart is enlarges; whether fluid is building up in the lungs; and whether pacemaker / defibrillator leads are still in place.  We will do a dietary referral for weight loss  Low salt diet - Information included

## 2012-04-13 NOTE — Progress Notes (Signed)
HPI: Patient seen at the kind request of Dr. Lodema Hong for initial cardiology evaluation regarding exercise intolerance.  This nice woman has enjoyed generally good health.  She was seen by [facility] 5 years ago after developing chest discomfort around the time of a nephrectomy for renal carcinoma.  Cardiac catheterization was entirely normal with normal coronary angiography and normal left ventricular function.  She subsequently did well until approximately 2 years ago.  Over that interval, she has noted decreased exercise tolerance and increased dyspnea on exertion.  She particularly describes this at night when she gets up to go to the bathroom.  After returning to bed, she needs to breathe hard for a period of time.  She has had a long-standing problem with excessive weight, but has not been successful with caloric restriction.  She has never used tobacco products.  Patient is participating in a hypertension study known as [employer], which appears to be directed towards management strategy rather than any specific antihypertensive drug.  Current Outpatient Prescriptions on File Prior to Visit  Medication Sig Dispense Refill  . AMLODIPINE BESYLATE PO Take 10 mg by mouth daily. One tab by mouth once daily      . aspirin (ASPIRIN LOW DOSE) 81 MG EC tablet Take 81 mg by mouth daily. One tab by mouth once daily       . atenolol (TENORMIN) 25 MG tablet Take 25 mg by mouth daily.      . calcium-vitamin D (OSCAL 500/200 D-3) 500 MG tablet Take 1 tablet by mouth 3 (three) times daily. Take 1 tablet by mouth three times a day       . chlorthalidone (HYGROTON) 25 MG tablet Take 25 mg by mouth daily.        Marland Kitchen FLUoxetine (PROZAC) 10 MG tablet Take 1 tablet (10 mg total) by mouth daily. Take 1 tablet by mouth once a day  90 tablet  1  . levothyroxine (SYNTHROID, LEVOTHROID) 100 MCG tablet Take 1 tablet (100 mcg total) by mouth daily.  90 tablet  1  . lisinopril (PRINIVIL,ZESTRIL) 40 MG tablet Take  40 mg by mouth daily.        Marland Kitchen lovastatin (MEVACOR) 40 MG tablet Take 1 tablet (40 mg total) by mouth at bedtime.  90 tablet  1  . meclizine (GNP MOTION SICKNESS RELIEF) 25 MG tablet Take 1 tablet (25 mg total) by mouth daily. One daily as needed for vertigo  30 tablet  0  . Multiple Vitamins-Minerals (ONE-A-DAY EXTRAS ANTIOXIDANT PO) Take by mouth. Take one tablet by mouth       . Omega-3 Fatty Acids (EQL FISH OIL) 1000 MG CAPS Take by mouth. Take 1 tablet  by mouth two times a day       . potassium chloride SA (K-DUR,KLOR-CON) 20 MEQ tablet Take 1 tablet (20 mEq total) by mouth daily.  30 tablet  5  . terbinafine (LAMISIL) 250 MG tablet Take 1 tablet (250 mg total) by mouth daily.  42 tablet  1  . zolpidem (AMBIEN) 10 MG tablet Take 10 mg by mouth at bedtime as needed. One tablet by mouth at bedtime       . DISCONTD: hydrALAZINE (APRESOLINE) 25 MG tablet Take 1 tablet (25 mg total) by mouth 3 (three) times daily. Take two tablets by mouth three times a day  180 tablet  3   Allergies  Allergen Reactions  . Morphine     Past Medical History  Diagnosis Date  .  Vertigo, intermittent   . Hypothyroidism   . Hyperlipidemia   . Obesity   . Hypertension     severe and resistant to treatment; 2008-negative left renal angiogram  . Renal cell carcinoma     Right nephrectomy  . Vitiligo   . Degenerative disc disease, lumbar   . Degenerative disc disease, cervical   . Chest pain 2008    2008-normal coronary angiography    Past Surgical History  Procedure Date  . Tubal ligation 1983    Bilateral  . Nephrectomy     Right; secondary to renal cell carcinoma    Family History  Problem Relation Age of Onset  . Heart disease Mother 37  . Hypertension Mother   . Colon cancer Father   . Hypertension Sister     x2  . Diabetes Sister     x2  . Heart failure Sister   . Lupus Brother     History   Social History  . Marital Status: Divorced    Spouse Name: N/A    Number of Children: 2  .  Years of Education: N/A   Occupational History  . Nursing assistant    Social History Main Topics  . Smoking status: Never Smoker   . Smokeless tobacco: Never Used  . Alcohol Use: No  . Drug Use: No  . Sexually Active: Not on file   Other Topics Concern  . Not on file   Social History Narrative   Three grandchildren     ROS:   She has had no chest discomfort, orthopnea, PND, pedal edema, palpitations, lightheadedness or syncope.  All other systems reviewed and are negative.    All other systems reviewed and are negative.  PHYSICAL EXAM: BP 135/74  Pulse 71  Resp 16  Ht 5\' 5"  (1.651 m)  Wt 106.142 kg (234 lb)  BMI 38.94 kg/m2  General-Well-developed; no acute distress Body Habitus-Moderately overweight HEENT-[place]/AT; PERRL; EOM intact; conjunctiva and lids nl Neck-No JVD; no carotid bruits Endocrine-No thyromegaly Lungs-Clear lung fields; resonant percussion; normal I-to-E ratio Cardiovascular- normal PMI; normal S1 and S2 Abdomen-BS normal; soft and non-tender without masses or organomegaly Musculoskeletal-No deformities, cyanosis or clubbing Neurologic-Nl cranial nerves; symmetric strength and tone Skin- Warm, no significant lesions Extremities-Nl distal pulses; no edema  EKG:  Tracing performed in 06/2011 obtained and reviewed: Sinus bradycardia at a rate of 55 bpm, ST-T wave abnormalities-possible inferior ischemia, borderline voltage criteria for LVH.  No previous tracing for comparison.   ASSESSMENT AND PLAN:  [place] Bing, MD 04/13/2012 2:06 PM

## 2012-04-13 NOTE — Progress Notes (Signed)
Name: Melissa [name]    DOB: [birth year]  Age: 66 y.o.  MR#: [id number]       PCP:  [name], MD, MD      Insurance: @PAYORNAME @   CC:    Chief Complaint  Patient presents with  . DOE per Dr Lodema Hong    Prev seen in 2008/Dr Alanda Amass - Normal cath/TC +meds    VS BP 135/74  Pulse 71  Resp 16  Ht 5\' 5"  (1.651 m)  Wt 234 lb (106.142 kg)  BMI 38.94 kg/m2  Weights Current Weight  04/13/12 234 lb (106.142 kg)  03/23/12 237 lb 1.3 oz (107.539 kg)  01/22/12 230 lb (104.327 kg)    Blood Pressure  BP Readings from Last 3 Encounters:  04/13/12 135/74  03/23/12 150/70  01/22/12 184/76     Admit date:  (Not on file) Last encounter with RMR:  04/12/2012   Allergy Allergies  Allergen Reactions  . Morphine     Current Outpatient Prescriptions  Medication Sig Dispense Refill  . AMLODIPINE BESYLATE PO Take 10 mg by mouth daily. One tab by mouth once daily      . aspirin (ASPIRIN LOW DOSE) 81 MG EC tablet Take 81 mg by mouth daily. One tab by mouth once daily       . atenolol (TENORMIN) 25 MG tablet Take 25 mg by mouth daily.      . calcium-vitamin D (OSCAL 500/200 D-3) 500 MG tablet Take 1 tablet by mouth 3 (three) times daily. Take 1 tablet by mouth three times a day       . chlorthalidone (HYGROTON) 25 MG tablet Take 25 mg by mouth daily.        Marland Kitchen FLUoxetine (PROZAC) 10 MG tablet Take 1 tablet (10 mg total) by mouth daily. Take 1 tablet by mouth once a day  90 tablet  1  . hydrALAZINE (APRESOLINE) 25 MG tablet Take 75 mg by mouth 3 (three) times daily.      Marland Kitchen levothyroxine (SYNTHROID, LEVOTHROID) 100 MCG tablet Take 1 tablet (100 mcg total) by mouth daily.  90 tablet  1  . lisinopril (PRINIVIL,ZESTRIL) 40 MG tablet Take 40 mg by mouth daily.        Marland Kitchen lovastatin (MEVACOR) 40 MG tablet Take 1 tablet (40 mg total) by mouth at bedtime.  90 tablet  1  . meclizine (GNP MOTION SICKNESS RELIEF) 25 MG tablet Take 1 tablet (25 mg total) by mouth daily. One daily as needed for vertigo   30 tablet  0  . Multiple Vitamins-Minerals (ONE-A-DAY EXTRAS ANTIOXIDANT PO) Take by mouth. Take one tablet by mouth       . Omega-3 Fatty Acids (EQL FISH OIL) 1000 MG CAPS Take by mouth. Take 1 tablet  by mouth two times a day       . potassium chloride SA (K-DUR,KLOR-CON) 20 MEQ tablet Take 1 tablet (20 mEq total) by mouth daily.  30 tablet  5  . terbinafine (LAMISIL) 250 MG tablet Take 1 tablet (250 mg total) by mouth daily.  42 tablet  1  . zolpidem (AMBIEN) 10 MG tablet Take 10 mg by mouth at bedtime as needed. One tablet by mouth at bedtime       . DISCONTD: hydrALAZINE (APRESOLINE) 25 MG tablet Take 1 tablet (25 mg total) by mouth 3 (three) times daily. Take two tablets by mouth three times a day  180 tablet  3    Discontinued Meds:    Medications  Discontinued During This Encounter  Medication Reason  . clobetasol (TEMOVATE) 0.05 % ointment Error  . hydrALAZINE (APRESOLINE) 25 MG tablet     Patient Active Problem List  Diagnoses  . Hypothyroid  . VITAMIN D DEFICIENCY  . Obesity  . Depression  . HEARING LOSS  . VERTIGO, INTERMITTENT  . Vitiligo  . Hyperlipidemia  . Hypertension  . Renal cell carcinoma  . Chest pain    LABS Office Visit on 03/23/2012  Component Date Value  . Culture 03/23/2012 ESCHERICHIA COLI   . Colony Count 03/23/2012 >=100,000 COLONIES/ML   . Organism ID, Bacteria 03/23/2012 ESCHERICHIA COLI   . Color, UA 03/23/2012 yellow   . Clarity, UA 03/23/2012 clear   . Glucose, UA 03/23/2012 negative   . Bilirubin, UA 03/23/2012 negative   . Ketones, UA 03/23/2012 negative   . Spec Grav, UA 03/23/2012 1.020   . Blood, UA 03/23/2012 negative   . pH, UA 03/23/2012 7.0   . Protein, UA 03/23/2012 trace   . Urobilinogen, UA 03/23/2012 1.0   . Nitrite, UA 03/23/2012 positive   . Leukocytes, UA 03/23/2012 Trace      Results for this Opt Visit:     Results for orders placed in visit on 03/23/12  URINE CULTURE      Component Value Range   Culture  ESCHERICHIA COLI     Colony Count >=100,000 COLONIES/ML     Organism ID, Bacteria ESCHERICHIA COLI    POCT URINALYSIS DIPSTICK      Component Value Range   Color, UA yellow     Clarity, UA clear     Glucose, UA negative     Bilirubin, UA negative     Ketones, UA negative     Spec Grav, UA 1.020     Blood, UA negative     pH, UA 7.0     Protein, UA trace     Urobilinogen, UA 1.0     Nitrite, UA positive     Leukocytes, UA Trace      EKG Orders placed in visit on 07/12/11  . EKG     Prior Assessment and Plan Problem List as of 04/13/2012          Cardiology Problems   Hyperlipidemia   Hypertension     Other   Hypothyroid   Last Assessment & Plan Note   11/19/2011 Office Visit Signed 11/25/2011  4:50 AM by Kerri Perches, MD    Controlled, no change in medication     VITAMIN D DEFICIENCY   Obesity   Last Assessment & Plan Note   03/23/2012 Office Visit Signed 03/23/2012  5:51 PM by Kerri Perches, MD    Deteriorated. Patient re-educated about  the importance of commitment to a  minimum of 150 minutes of exercise per week. The importance of healthy food choices with portion control discussed. Encouraged to start a food diary, count calories and to consider  joining a support group. Sample diet sheets offered. Goals set by the patient for the next several months.       Depression   Last Assessment & Plan Note   06/17/2011 Office Visit Signed 06/23/2011  5:22 PM by [name], MD    Improved and controlled on current medication, no change    HEARING LOSS   VERTIGO, INTERMITTENT   Vitiligo   Last Assessment & Plan Note   06/17/2011 Office Visit Signed 06/23/2011  5:23 PM by [name], MD    Unchanged, no  specific management, has seen dermatology in the past    Renal cell carcinoma   Chest pain       Imaging: No results found.   FRS Calculation: Score not calculated. Missing: Total Cholesterol

## 2012-04-14 ENCOUNTER — Encounter: Payer: Self-pay | Admitting: *Deleted

## 2012-04-14 ENCOUNTER — Other Ambulatory Visit: Payer: Self-pay | Admitting: *Deleted

## 2012-04-14 DIAGNOSIS — E876 Hypokalemia: Secondary | ICD-10-CM

## 2012-04-14 LAB — TSH: TSH: 0.233 u[IU]/mL — ABNORMAL LOW (ref 0.350–4.500)

## 2012-04-14 MED ORDER — POTASSIUM CHLORIDE CRYS ER 20 MEQ PO TBCR: EXTENDED_RELEASE_TABLET | ORAL | Status: DC

## 2012-04-14 NOTE — Progress Notes (Signed)
Order faxed to Burnard Hawthorne, RD for weight reduction dietary counciling

## 2012-04-14 NOTE — Assessment & Plan Note (Signed)
Blood pressure control had been suboptimal in the past, but appears to be improved with participation in a research protocol.

## 2012-04-14 NOTE — Assessment & Plan Note (Signed)
Excellent control of hyperlipidemia when last assessed approximately 6 months ago.

## 2012-04-14 NOTE — Assessment & Plan Note (Signed)
Obesity is almost certainly contributing to her exercise intolerance.  Appointment for dietary therapy was scheduled.

## 2012-04-16 ENCOUNTER — Other Ambulatory Visit: Payer: Self-pay | Admitting: *Deleted

## 2012-04-16 ENCOUNTER — Encounter: Payer: Self-pay | Admitting: *Deleted

## 2012-04-16 ENCOUNTER — Telehealth (HOSPITAL_COMMUNITY): Payer: Self-pay | Admitting: Dietician

## 2012-04-16 DIAGNOSIS — E876 Hypokalemia: Secondary | ICD-10-CM

## 2012-04-16 NOTE — Telephone Encounter (Signed)
Sent letter to pt home via [place] Mail in attempt to contact pt to schedule appointment.  

## 2012-04-16 NOTE — Telephone Encounter (Signed)
Received referral from Dr. Dietrich Pates for dx: obesity on 04/14/12.

## 2012-04-17 ENCOUNTER — Telehealth: Payer: Self-pay | Admitting: Family Medicine

## 2012-04-17 MED ORDER — CHLORTHALIDONE 25 MG PO TABS: 25.0000 mg | ORAL_TABLET | Freq: Every day | ORAL | Status: DC

## 2012-04-17 MED ORDER — HYDRALAZINE HCL 25 MG PO TABS: 75.0000 mg | ORAL_TABLET | Freq: Three times a day (TID) | ORAL | Status: DC

## 2012-04-17 NOTE — Assessment & Plan Note (Signed)
Current symptoms are somewhat variable in their occurrence.  She develops dyspnea with modest effort when she arises from bed, but has better exercise capacity at another times during the day.  A negative cardiac catheterization 5 years ago does not totally exclude the possibility of coronary disease, but certainly renders it quite unlikely. She has no known pulmonary problems; a chest x-ray will be obtained.  Basic laboratory studies Including a complete metabolic profile, BNP level, d-dimer level and TSH have been ordered.  CBC was recently normal.  I've recommended a trial of increased activity since her current symptoms are likely secondary to obesity and physical deconditioning.

## 2012-04-17 NOTE — Telephone Encounter (Signed)
Given 30 days supply, she needs to call cardiologist to have med refilled

## 2012-04-17 NOTE — Telephone Encounter (Signed)
(  Dr Simpson's patient) These meds are prescribed by cardiology. [facility] are closed and she has ran out. Needs enough to last over the weekend. Ok to fill enough until she sees them tues?

## 2012-04-20 ENCOUNTER — Telehealth: Payer: Self-pay | Admitting: *Deleted

## 2012-04-20 ENCOUNTER — Encounter: Payer: Self-pay | Admitting: *Deleted

## 2012-04-20 ENCOUNTER — Other Ambulatory Visit: Payer: Self-pay | Admitting: *Deleted

## 2012-04-20 DIAGNOSIS — E876 Hypokalemia: Secondary | ICD-10-CM

## 2012-04-20 MED ORDER — LEVOTHYROXINE SODIUM 50 MCG PO TABS: 50.0000 ug | ORAL_TABLET | ORAL | Status: DC

## 2012-04-20 MED ORDER — LEVOTHYROXINE SODIUM 100 MCG PO TABS: 100.0000 ug | ORAL_TABLET | ORAL | Status: DC

## 2012-04-20 MED ORDER — POTASSIUM CHLORIDE CRYS ER 20 MEQ PO TBCR: 30.0000 meq | EXTENDED_RELEASE_TABLET | Freq: Every day | ORAL | Status: DC

## 2012-04-20 NOTE — Telephone Encounter (Signed)
Certified letter confirmation returned [phone]

## 2012-04-20 NOTE — Telephone Encounter (Signed)
Called pt to let her know. No answer. 

## 2012-04-21 ENCOUNTER — Telehealth: Payer: Self-pay | Admitting: *Deleted

## 2012-04-21 DIAGNOSIS — E876 Hypokalemia: Secondary | ICD-10-CM

## 2012-04-21 MED ORDER — POTASSIUM CHLORIDE CRYS ER 20 MEQ PO TBCR: EXTENDED_RELEASE_TABLET | ORAL | Status: DC

## 2012-04-22 NOTE — Telephone Encounter (Signed)
Sent letter to pt home via [place] Mail in attempt to contact pt to schedule appointment.  

## 2012-04-27 ENCOUNTER — Telehealth: Payer: Self-pay | Admitting: Family Medicine

## 2012-04-29 NOTE — Telephone Encounter (Signed)
Requested labs mailed.

## 2012-04-30 NOTE — Telephone Encounter (Signed)
Sent letter to pt home via [place] Mail in attempt to contact pt to schedule appointment.  

## 2012-05-04 ENCOUNTER — Telehealth (HOSPITAL_COMMUNITY): Payer: Self-pay | Admitting: Dietician

## 2012-05-04 NOTE — Telephone Encounter (Signed)
Appointment scheduled for Friday, 05/08/12 at 12:00 PM (time pt requested on message). Sent appointment confirmation letter to pt home via [place] Mail.

## 2012-05-04 NOTE — Telephone Encounter (Signed)
See previous telephone encounter for details. Confirmed appointment on 05/08/12 at 12:00 PM.

## 2012-05-08 ENCOUNTER — Encounter (HOSPITAL_COMMUNITY): Payer: Self-pay | Admitting: Dietician

## 2012-05-08 NOTE — Progress Notes (Signed)
Outpatient Initial Nutrition Assessment  Date:05/08/2012   Time: 12:00 PM  Referring Physician: Dr. Dietrich Pates Reason for Visit: wt loss  Pt familiar to me. Pt attended diabetes class at [facility] on 03/31/12.  Nutrition Assessment:  Height: 5\' 5"  (165.1 cm)   Weight: 233 lb (105.688 kg)   IBW: 125# %IBW: 186% UBW: 220# %UBW: 106% Body mass index is 38.77 kg/(m^2).  Goal Weight: Pt reports her desired weight is 160#. Weight hx: Pt reports her heaviest weight was 240#, around 1 year ago. Her lowest weight was 180#, but can't remember how long ago that was.  Wt Readings from Last 10 Encounters:  05/08/12 233 lb (105.688 kg)  04/13/12 234 lb (106.142 kg)  03/23/12 237 lb 1.3 oz (107.539 kg)  01/22/12 230 lb (104.327 kg)  01/21/12 233 lb 1.9 oz (105.743 kg)  11/19/11 201 lb 1.9 oz (91.227 kg)  06/17/11 228 lb 12.8 oz (103.783 kg)  05/17/11 226 lb 1.9 oz (102.567 kg)  03/12/11 227 lb (102.967 kg)  12/04/10 232 lb 4 oz (105.348 kg)   Estimated nutritional needs: 1835-2002 kcals daily, 85-106 grams protein daily, 1.8-2.0 L fluid daily  PMH:  Past Medical History  Diagnosis Date  . Vertigo, intermittent   . Hypothyroidism   . Hyperlipidemia   . Obesity   . Hypertension     severe and resistant to treatment; 2008-negative left renal angiogram  . Renal cell carcinoma     Right nephrectomy  . Vitiligo   . Degenerative disc disease, lumbar   . Degenerative disc disease, cervical   . Chest pain 2008    2008-normal coronary angiography    Medications:  Current Outpatient Rx  Name Route Sig Dispense Refill  . AMLODIPINE BESYLATE PO Oral Take 10 mg by mouth daily. One tab by mouth once daily    . ASPIRIN 81 MG PO TBEC Oral Take 81 mg by mouth daily. One tab by mouth once daily     . ATENOLOL 25 MG PO TABS Oral Take 25 mg by mouth daily.    Marland Kitchen CALCIUM-VITAMIN D 500 MG PO TABS Oral Take 1 tablet by mouth 3 (three) times daily. Take 1 tablet by mouth three times a day     . CHLORTHALIDONE 25  MG PO TABS Oral Take 1 tablet (25 mg total) by mouth daily. 30 tablet 0  . FLUOXETINE HCL 10 MG PO TABS Oral Take 1 tablet (10 mg total) by mouth daily. Take 1 tablet by mouth once a day 90 tablet 1  . HYDRALAZINE HCL 25 MG PO TABS Oral Take 3 tablets (75 mg total) by mouth 3 (three) times daily. 90 tablet 0  . LEVOTHYROXINE SODIUM 100 MCG PO TABS Oral Take 1 tablet (100 mcg total) by mouth 3 (three) times a week. Monday, Wednesday, Friday 30 tablet 12  . LEVOTHYROXINE SODIUM 50 MCG PO TABS Oral Take 1 tablet (50 mcg total) by mouth 4 (four) times a week. Take on Tuesday, Thursday, Saturday and Sunday 30 tablet 12  . LISINOPRIL 40 MG PO TABS Oral Take 40 mg by mouth daily.      Marland Kitchen LOVASTATIN 40 MG PO TABS Oral Take 1 tablet (40 mg total) by mouth at bedtime. 90 tablet 1  . MECLIZINE HCL 25 MG PO TABS Oral Take 1 tablet (25 mg total) by mouth daily. One daily as needed for vertigo 30 tablet 0  . ONE-A-DAY EXTRAS ANTIOXIDANT PO Oral Take by mouth. Take one tablet by mouth     .  EQL FISH OIL 1000 MG PO CAPS Oral Take by mouth. Take 1 tablet  by mouth two times a day     . POTASSIUM CHLORIDE CRYS ER 20 MEQ PO TBCR  Take 1.5 tablets daily = 30 meq 45 tablet 12    Dose reduction effective 03/23/2012  . TERBINAFINE HCL 250 MG PO TABS Oral Take 1 tablet (250 mg total) by mouth daily. 42 tablet 1  . ZOLPIDEM TARTRATE 10 MG PO TABS Oral Take 10 mg by mouth at bedtime as needed. One tablet by mouth at bedtime       Labs: CMP     Component Value Date/Time   NA 137 04/13/2012 1409   K 3.3* 04/13/2012 1409   CL 100 04/13/2012 1409   CO2 31 04/13/2012 1409   GLUCOSE 93 04/13/2012 1409   BUN 18 04/13/2012 1409   CREATININE 1.23* 04/13/2012 1409   CREATININE 1.09 11/20/2010 0109   CALCIUM 9.8 04/13/2012 1409   PROT 8.8* 04/13/2012 1409   ALBUMIN 3.8 04/13/2012 1409   AST 18 04/13/2012 1409   ALT 15 04/13/2012 1409   ALKPHOS 40 04/13/2012 1409   BILITOT 0.3 04/13/2012 1409   GFRNONAA >60 05/08/2010 0703   GFRAA   Value: >60        The eGFR has been calculated using the MDRD equation. This calculation has not been validated in all clinical situations. eGFR's persistently <60 mL/min signify possible Chronic Kidney Disease. 05/08/2010 0703    Lipid Panel     Component Value Date/Time   CHOL 115 11/19/2011 1546   TRIG 140 11/19/2011 1546   HDL 34* 11/19/2011 1546   CHOLHDL 3.4 11/19/2011 1546   VLDL 28 11/19/2011 1546   LDLCALC 53 11/19/2011 1546     Lab Results  Component Value Date   HGBA1C 5.5 11/19/2011   HGBA1C 5.6 11/19/2011   HGBA1C 6.0* 07/31/2011   Lab Results  Component Value Date   LDLCALC 53 11/19/2011   CREATININE 1.23* 04/13/2012     Lifestyle/ social habits: Melissa [name] lives alone in [place]. She stays at her son's house during the week, as she is the primary caregiver for her two grandchildren, ages 3 and 69. 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 She reports her stress level as a 6, citing taking care of her grandchildren, being on a fixed income, and finding a job/hobby as her main sources of stress.   Nutrition hx/habits: Melissa [name] reports she has always had a problem with her weight. It has always "gone up and down". She wants to achieve a weight of 160# because she feels that she would be most comfortable at that weight. She thinks the would "look weird" at 120#. She complains of decreased appetite. She also complains "I can't eat anything I want to eat. I want to eat the food that won't hurt me". She also expresses frustration about difficulty eating healthy, as her grandchildren will not eat the foods she serves if she provides healthy options. She also expressed a great interest in learning how to cook. She admits to using butter, grease, salt, and sugar in her cooking. She has started exercising; she reports she doesn't follow a structured regimen, but tries to do leg and arm exercises daily.   Diet recall: Breakfast: 2 boiled eggs; Lunch: grilled chicken,  squash and onions made with oil, salt, and pepper; Dinner: stuffing, chicken breast, and cranberry sauce.   Nutrition Diagnosis: Involuntary weight gain r/t diet high  in fats and sugar, physical inactivity AEB 32#, 15.9% weight gain x 6 months.   Nutrition Intervention: Nutrition rx: 1500 kcal NAS, no sugar added diet; 3 meals per day; low calorie beverages only; physical activity as tolerated  Education/Counseling Provided: Reviewed plate method with pt. Discussed healthy food preparation methods and alternatives to using salt and sugar to season foods. Discussed importance of regular physical activity to assist with weight loss. Discussed community resources, such as cooking classes at [facility], to help pt learn how to cook. Discussed slow, moderate weight loss and importance of setting small, achievable goals. Discussed importance of keeping a food diary. Provided "10 Simple Steps" booklet from [facility]. Also provided handouts re: weight management from [employer].   Understanding, Motivation, Ability to Follow Recommendations: Expect fair compliance.   Monitoring and Evaluation: Goals: 1) 1-2# weight loss per week; 2) Physical activity as tolerated  Recommendations: 1) For weight loss: 1335-1502 kcals daily; 2) Seek out community resources providing cooking classes; 3) Find a hobby; 4) Physical activity as tolerated- start with short, more frequent sessions and build up to longer sessions; 5) Keep food diary.   F/U: PRN. Pt reports she is interested in follow-up, but needs to check availability. Provided RD contact information. Pt will be notified when next community cooking class at [facility] is scheduled.   Melissa [name], [place]  05/08/2012  Time: 12:00 PM

## 2012-05-21 ENCOUNTER — Encounter: Payer: Self-pay | Admitting: *Deleted

## 2012-05-27 ENCOUNTER — Telehealth (HOSPITAL_COMMUNITY): Payer: Self-pay | Admitting: Dietician

## 2012-05-27 NOTE — Telephone Encounter (Signed)
Offered appointment for 05/29/12 at 1:00 PM. Left message for pt to call and confirm appointment.

## 2012-05-27 NOTE — Telephone Encounter (Signed)
Also mailed information to pt home via [place] Mail re: community cooking class on 06/16/12 per pt request.

## 2012-05-29 ENCOUNTER — Other Ambulatory Visit: Payer: Self-pay | Admitting: Cardiology

## 2012-05-29 ENCOUNTER — Telehealth (HOSPITAL_COMMUNITY): Payer: Self-pay | Admitting: Dietician

## 2012-05-29 ENCOUNTER — Encounter (HOSPITAL_COMMUNITY): Payer: Self-pay | Admitting: Dietician

## 2012-05-29 NOTE — Progress Notes (Signed)
Follow-Up Outpatient Nutrition Note Date: 05/29/2012  Time: 1:00 PM  Nutrition Assessment:  Current weight: Weight: 230 lb (104.327 kg)  BMI: Body mass index is 38.27 kg/(m^2).  Weight changes: -3# (1.3%) x 3 weeks  Melissa [name] is delighted with her weight loss. She reports "I was hoping I lost something because I've been trying". She reports that she has been eating less and has started exercising. She reports she is using her excise bike twice daily, AM and PM. She reports that she counts up to 150 and stops each time she goes on the bike. This would equate to about 2.5 minutes per session, or 5 minutes per day. She reports she will work up to 175 seconds per session next week. She continues to express interest in learning how to cook healthier. She reports she has been watching cooking shows on the [employer] to get ideas. However, she reports that she is afraid to try new foods because she does not want to waste food if her and her family don't like the dish she prepared.  She reports that the cookbook she was given was helpful. She also like the herbs and spices handouts she was given, but requests another one. She has multiple questions today regarding seasonings and food preparation methods.  Pt inquiring about upcoming community cooking class on June 25th. Pt was registered for cooking class today.   Labs: CMP     Component Value Date/Time   NA 137 04/13/2012 1409   K 3.3* 04/13/2012 1409   CL 100 04/13/2012 1409   CO2 31 04/13/2012 1409   GLUCOSE 93 04/13/2012 1409   BUN 18 04/13/2012 1409   CREATININE 1.23* 04/13/2012 1409   CREATININE 1.09 11/20/2010 0109   CALCIUM 9.8 04/13/2012 1409   PROT 8.8* 04/13/2012 1409   ALBUMIN 3.8 04/13/2012 1409   AST 18 04/13/2012 1409   ALT 15 04/13/2012 1409   ALKPHOS 40 04/13/2012 1409   BILITOT 0.3 04/13/2012 1409   GFRNONAA >60 05/08/2010 0703   GFRAA  Value: >60        The eGFR has been calculated using the MDRD equation. This calculation has not been  validated in all clinical situations. eGFR's persistently <60 mL/min signify possible Chronic Kidney Disease. 05/08/2010 0703    Lipid Panel     Component Value Date/Time   CHOL 115 11/19/2011 1546   TRIG 140 11/19/2011 1546   HDL 34* 11/19/2011 1546   CHOLHDL 3.4 11/19/2011 1546   VLDL 28 11/19/2011 1546   LDLCALC 53 11/19/2011 1546     Lab Results  Component Value Date   HGBA1C 5.5 11/19/2011   HGBA1C 5.6 11/19/2011   HGBA1C 6.0* 07/31/2011   Lab Results  Component Value Date   LDLCALC 53 11/19/2011   CREATININE 1.23* 04/13/2012     Diet recall: Breakfast: 2 eggs, piece of toast, piece of bacon; Lunch: 1/2 tuna fish sandwich; Dinner: piece of pizza and a salad. She continues to drink only water.   Nutrition Diagnosis: Involuntary weight gain resolved. New nutrition dx: Nutrition-related knowledge deficit r/t healthy eating and weight loss AEB pt with many diet related questions.   Nutrition Intervention: Nutrition rx: 1500 kcal NAS, no sugar added diet; 3 meals per day; low calorie beverages only; physical activity as tolerated  Education/ counseling provided: Reviewed plate method and healthy food preparation guidelines. Encouraged pt to use herbs and spices in place of salt. Encouraged pt to try new recipes. Discussed ways to modify original recipes  such as adding less sugar and butter and using low fat ingredients. Discussed portion control. Also discussed importance of continued physical activity. Praised pt for progress made, but encourage pt to work up slowly to longer sessions and/or participate in more frequent sessions to achieve goal of 30 minutes per day. Provided additional herbs and spices handouts per pt request.   Understanding/Motivation/ Ability to follow recommendations: Expect fair to good compliance.   Monitoring and Evaluation: Previous goals: 1) 1-2# weight loss per week- progressing; 2) Physical activity as tolerated- goal met  Goals for next visit: 1) 1-2#  weight loss per week; 2) 15 minutes physical activity daily; 3) Attend community cooking class  Recommendations: 1) Seek out community resources providing cooking classes; 2) Continue to increase length of physical activity; 3) Keep food diary  F/U: 2 months. Next appointment scheduled for 08/13/12 at 12 PM.   Melody Haver, RD, LDN Date:05/29/2012 Time: 1:00 PM

## 2012-05-29 NOTE — Telephone Encounter (Signed)
Received voicemail from pt left on 05/28/12 at 4:59 PM confirming appointment on 05/29/12 at 1:00 PM.

## 2012-05-30 LAB — BASIC METABOLIC PANEL
BUN: 23 mg/dL (ref 6–23)
CO2: 28 mEq/L (ref 19–32)
Calcium: 9.8 mg/dL (ref 8.4–10.5)
Creat: 1.21 mg/dL — ABNORMAL HIGH (ref 0.50–1.10)
Glucose, Bld: 74 mg/dL (ref 70–99)

## 2012-06-01 ENCOUNTER — Encounter: Payer: Self-pay | Admitting: *Deleted

## 2012-06-12 ENCOUNTER — Ambulatory Visit: Payer: Medicare Other | Admitting: Cardiology

## 2012-06-19 ENCOUNTER — Ambulatory Visit (INDEPENDENT_AMBULATORY_CARE_PROVIDER_SITE_OTHER): Payer: Medicare Other | Admitting: Adult Health

## 2012-06-19 ENCOUNTER — Encounter: Payer: Self-pay | Admitting: Adult Health

## 2012-06-19 ENCOUNTER — Other Ambulatory Visit: Payer: Self-pay | Admitting: Family Medicine

## 2012-06-19 VITALS — BP 139/75 | HR 66 | Resp 18 | Ht 65.0 in | Wt 231.0 lb

## 2012-06-19 DIAGNOSIS — Z139 Encounter for screening, unspecified: Secondary | ICD-10-CM

## 2012-06-19 DIAGNOSIS — E785 Hyperlipidemia, unspecified: Secondary | ICD-10-CM

## 2012-06-19 DIAGNOSIS — I10 Essential (primary) hypertension: Secondary | ICD-10-CM

## 2012-06-19 MED ORDER — LOSARTAN POTASSIUM 100 MG PO TABS: 100.0000 mg | ORAL_TABLET | Freq: Every day | ORAL | Status: DC

## 2012-06-19 NOTE — Patient Instructions (Addendum)
Your physician recommends that you schedule a follow-up appointment in: 6 months  Your physician has recommended you make the following change in your medication:  1 - STOP Lisinopril  2 - START Losartan 100 mg daily

## 2012-06-19 NOTE — Progress Notes (Signed)
HPI: Melissa [name] is a 66 y/o patient of Dr. Dietrich Pates we are seeing on follow up for ongoing assessment and treatment of hypertension, chronic DOE, with history of obesity and hyperlipidemia. She comes today with complaints of a tickle cough which is irritating to her. She denies chest pain, but has NYHA Class I-II symptoms which are related to obesity. She is part of a [facility] research trial for hypertension.  Allergies  Allergen Reactions  . Morphine     Current Outpatient Prescriptions  Medication Sig Dispense Refill  . AMLODIPINE BESYLATE PO Take 10 mg by mouth daily. One tab by mouth once daily      . aspirin (ASPIRIN LOW DOSE) 81 MG EC tablet Take 81 mg by mouth daily. One tab by mouth once daily       . atenolol (TENORMIN) 25 MG tablet Take 25 mg by mouth daily.      . calcium-vitamin D (OSCAL 500/200 D-3) 500 MG tablet Take 1 tablet by mouth 3 (three) times daily. Take 1 tablet by mouth three times a day       . chlorthalidone (HYGROTON) 25 MG tablet Take 1 tablet (25 mg total) by mouth daily.  30 tablet  0  . FLUoxetine (PROZAC) 10 MG tablet Take 1 tablet (10 mg total) by mouth daily. Take 1 tablet by mouth once a day  90 tablet  1  . hydrALAZINE (APRESOLINE) 25 MG tablet Take 3 tablets (75 mg total) by mouth 3 (three) times daily.  90 tablet  0  . levothyroxine (SYNTHROID, LEVOTHROID) 100 MCG tablet Take 1 tablet (100 mcg total) by mouth 3 (three) times a week. Monday, Wednesday, Friday  30 tablet  12  . levothyroxine (SYNTHROID, LEVOTHROID) 50 MCG tablet Take 1 tablet (50 mcg total) by mouth 4 (four) times a week. Take on Tuesday, Thursday, Saturday and Sunday  30 tablet  12  . lovastatin (MEVACOR) 40 MG tablet Take 1 tablet (40 mg total) by mouth at bedtime.  90 tablet  1  . meclizine (GNP MOTION SICKNESS RELIEF) 25 MG tablet Take 1 tablet (25 mg total) by mouth daily. One daily as needed for vertigo  30 tablet  0  . Multiple Vitamins-Minerals (ONE-A-DAY EXTRAS ANTIOXIDANT PO) Take  by mouth. Take one tablet by mouth       . Omega-3 Fatty Acids (EQL FISH OIL) 1000 MG CAPS Take by mouth. Take 1 tablet  by mouth two times a day       . potassium chloride SA (K-DUR,KLOR-CON) 20 MEQ tablet Take 20 mEq by mouth 2 (two) times daily.      Marland Kitchen zolpidem (AMBIEN) 10 MG tablet Take 10 mg by mouth at bedtime as needed. One tablet by mouth at bedtime       . DISCONTD: potassium chloride SA (K-DUR,KLOR-CON) 20 MEQ tablet Take 1.5 tablets daily = 30 meq  45 tablet  12  . losartan (COZAAR) 100 MG tablet Take 1 tablet (100 mg total) by mouth daily.  30 tablet  12    Past Medical History  Diagnosis Date  . Vertigo, intermittent   . Hypothyroidism   . Hyperlipidemia   . Obesity   . Hypertension     severe and resistant to treatment; 2008-negative left renal angiogram  . Renal cell carcinoma     Right nephrectomy  . Vitiligo   . Degenerative disc disease, lumbar   . Degenerative disc disease, cervical   . Chest pain 2008  2008-normal coronary angiography    Past Surgical History  Procedure Date  . Tubal ligation 1983    Bilateral  . Nephrectomy     Right; secondary to renal cell carcinoma    ZOX:WRUEAV of systems complete and found to be negative unless listed above  PHYSICAL EXAM BP 139/75  Pulse 66  Resp 18  Ht 5\' 5"  (1.651 m)  Wt 231 lb (104.781 kg)  BMI 38.44 kg/m2  General: Well developed, well nourished, in no acute distress Head: Eyes PERRLA, No xanthomas.   Normal cephalic and atraumatic Lungs: Clear bilaterally to auscultation and percussion. Heart: HRRR S1 S2, without MRG.  Pulses are 2+ & equal.            No carotid bruit. No JVD.  No abdominal bruits. No femoral bruits. Abdomen: Bowel sounds are positive, abdomen soft and non-tender without masses or                  Hernia's noted. Msk:  Back normal, normal gait. Normal strength and tone for age. Extremities: No clubbing, cyanosis or edema.  DP +1 Neuro: Alert and oriented X 3. Psych:  Good affect,  responds appropriately    ASSESSMENT AND PLAN

## 2012-06-19 NOTE — Assessment & Plan Note (Signed)
She is followed by PCP for ongoing labs and medications.

## 2012-06-19 NOTE — Assessment & Plan Note (Signed)
Blood pressure is well controlled but she may be having cough from use of lisinopril. Will change this to losartan 100 mg daily. She will get a copy of medication list on her AVS to take to research appointment.  Will see her again in 6 months.

## 2012-06-26 ENCOUNTER — Telehealth: Payer: Self-pay | Admitting: Family Medicine

## 2012-06-26 ENCOUNTER — Other Ambulatory Visit: Payer: Self-pay

## 2012-06-26 MED ORDER — FLUOXETINE HCL 10 MG PO TABS: 10.0000 mg | ORAL_TABLET | Freq: Every day | ORAL | Status: DC

## 2012-06-26 MED ORDER — HYDRALAZINE HCL 25 MG PO TABS: 75.0000 mg | ORAL_TABLET | Freq: Three times a day (TID) | ORAL | Status: DC

## 2012-06-26 MED ORDER — LOVASTATIN 40 MG PO TABS: 40.0000 mg | ORAL_TABLET | Freq: Every day | ORAL | Status: DC

## 2012-06-26 NOTE — Telephone Encounter (Signed)
meds sent

## 2012-06-29 ENCOUNTER — Ambulatory Visit (HOSPITAL_COMMUNITY)
Admission: RE | Admit: 2012-06-29 | Discharge: 2012-06-29 | Disposition: A | Discharge status: Home / Self Care | Payer: Medicare Other | Source: Ambulatory Visit | Attending: Family Medicine | Admitting: Family Medicine

## 2012-06-29 DIAGNOSIS — Z1231 Encounter for screening mammogram for malignant neoplasm of breast: Secondary | ICD-10-CM | POA: Insufficient documentation

## 2012-06-29 DIAGNOSIS — Z139 Encounter for screening, unspecified: Secondary | ICD-10-CM

## 2012-07-27 ENCOUNTER — Other Ambulatory Visit: Payer: Self-pay | Admitting: Family Medicine

## 2012-08-06 ENCOUNTER — Other Ambulatory Visit: Payer: Self-pay | Admitting: Family Medicine

## 2012-08-07 ENCOUNTER — Telehealth (HOSPITAL_COMMUNITY): Payer: Self-pay | Admitting: Dietician

## 2012-08-07 LAB — CBC
MCHC: 34.4 g/dL (ref 30.0–36.0)
Platelets: 310 10*3/uL (ref 150–400)
RDW: 16.6 % — ABNORMAL HIGH (ref 11.5–15.5)
WBC: 6 10*3/uL (ref 4.0–10.5)

## 2012-08-07 LAB — COMPREHENSIVE METABOLIC PANEL
Albumin: 3.8 g/dL (ref 3.5–5.2)
CO2: 32 mEq/L (ref 19–32)
Calcium: 9.4 mg/dL (ref 8.4–10.5)
Chloride: 102 mEq/L (ref 96–112)
Glucose, Bld: 80 mg/dL (ref 70–99)
Potassium: 3.3 mEq/L — ABNORMAL LOW (ref 3.5–5.3)
Sodium: 136 mEq/L (ref 135–145)
Total Bilirubin: 0.4 mg/dL (ref 0.3–1.2)
Total Protein: 8 g/dL (ref 6.0–8.3)

## 2012-08-07 LAB — LIPID PANEL
Cholesterol: 133 mg/dL (ref 0–200)
Triglycerides: 174 mg/dL — ABNORMAL HIGH (ref <150)

## 2012-08-07 NOTE — Telephone Encounter (Signed)
Mailed appointment confirmation letter and instructions for appointment scheduled 08/13/12 at 12:00 PM via [place] Mail.

## 2012-08-13 ENCOUNTER — Encounter (HOSPITAL_COMMUNITY): Payer: Self-pay | Admitting: Dietician

## 2012-08-13 ENCOUNTER — Ambulatory Visit: Payer: Medicare Other | Admitting: Family Medicine

## 2012-08-13 ENCOUNTER — Encounter: Payer: Self-pay | Admitting: Family Medicine

## 2012-08-13 ENCOUNTER — Ambulatory Visit (INDEPENDENT_AMBULATORY_CARE_PROVIDER_SITE_OTHER): Payer: Medicare Other | Admitting: Family Medicine

## 2012-08-13 VITALS — BP 140/78 | HR 58 | Resp 16 | Ht 65.0 in | Wt 233.8 lb

## 2012-08-13 DIAGNOSIS — D649 Anemia, unspecified: Secondary | ICD-10-CM

## 2012-08-13 DIAGNOSIS — E669 Obesity, unspecified: Secondary | ICD-10-CM

## 2012-08-13 DIAGNOSIS — Z13820 Encounter for screening for osteoporosis: Secondary | ICD-10-CM

## 2012-08-13 DIAGNOSIS — I10 Essential (primary) hypertension: Secondary | ICD-10-CM

## 2012-08-13 DIAGNOSIS — E785 Hyperlipidemia, unspecified: Secondary | ICD-10-CM

## 2012-08-13 DIAGNOSIS — R7301 Impaired fasting glucose: Secondary | ICD-10-CM

## 2012-08-13 DIAGNOSIS — E876 Hypokalemia: Secondary | ICD-10-CM

## 2012-08-13 DIAGNOSIS — E559 Vitamin D deficiency, unspecified: Secondary | ICD-10-CM

## 2012-08-13 DIAGNOSIS — E039 Hypothyroidism, unspecified: Secondary | ICD-10-CM

## 2012-08-13 MED ORDER — LEVOTHYROXINE SODIUM 50 MCG PO TABS: 50.0000 ug | ORAL_TABLET | ORAL | Status: DC

## 2012-08-13 NOTE — Assessment & Plan Note (Signed)
Labs look good, she is on an alternating schedule with her synthroid

## 2012-08-13 NOTE — Progress Notes (Signed)
Follow-Up Outpatient Nutrition Note Date: 08/13/2012  Appt Start Time: 12:09 PM  Nutrition Assessment:  Current weight: Weight: 231 lb (104.781 kg)  BMI: Body mass index is 38.44 kg/(m^2).  Weight changes: +1# (10.4%) x 10 weeks  Melissa [name] is surprised that she has not lost weight. She reports "I don't know what happened. I thought I was losing". She reports she has not been monitoring her weight as she does not have a scale at home.  Her exercise has improved. She reports that she is now lifting 3# weight for 10-15 minutes in the AM 3 times a week. She is also going on her stationery bike for 15 minutes 3 times a week. It is helpful for her to ride the bike while she is watching television, so she moved the bike to her living room. She reports she "sometimes" goes to the [facility] to walk in the water, but reports time constraints from babysitting her grandchildren are a barrier.  She is learning to cook by "trial and error". She is limiting fried foods and eating more vegetables in her garden. She is trying new recipes, such as oven fried chicken. She reports she skips meals because she sometimes does not want the food she prepares. She is still cautious about trying new foods because she does not want to "waste food" if she does not like it. She attended the community cooking class in June and found it helpful.   Labs: CMP     Component Value Date/Time   NA 136 08/06/2012 1103   K 3.3* 08/06/2012 1103   CL 102 08/06/2012 1103   CO2 32 08/06/2012 1103   GLUCOSE 80 08/06/2012 1103   BUN 15 08/06/2012 1103   CREATININE 1.09 08/06/2012 1103   CREATININE 1.09 11/20/2010 0109   CALCIUM 9.4 08/06/2012 1103   PROT 8.0 08/06/2012 1103   ALBUMIN 3.8 08/06/2012 1103   AST 17 08/06/2012 1103   ALT 11 08/06/2012 1103   ALKPHOS 36* 08/06/2012 1103   BILITOT 0.4 08/06/2012 1103   GFRNONAA >60 05/08/2010 0703   GFRAA  Value: >60        The eGFR has been calculated using the MDRD equation. This calculation has not been  validated in all clinical situations. eGFR's persistently <60 mL/min signify possible Chronic Kidney Disease. 05/08/2010 0703    Lipid Panel     Component Value Date/Time   CHOL 133 08/06/2012 1103   TRIG 174* 08/06/2012 1103   HDL 35* 08/06/2012 1103   CHOLHDL 3.8 08/06/2012 1103   VLDL 35 08/06/2012 1103   LDLCALC 63 08/06/2012 1103     Lab Results  Component Value Date   HGBA1C 5.5 11/19/2011   HGBA1C 5.6 11/19/2011   HGBA1C 6.0* 07/31/2011   Lab Results  Component Value Date   LDLCALC 63 08/06/2012   CREATININE 1.09 08/06/2012     Diet recall: Breakfast: corn flakes with banana; Lunch: skip; Dinner: tomato sandwich. She drinks water and has started drinking sugar free Hawaiian Punch.   Nutrition Diagnosis:  Nutrition-related knowledge deficit r/t healthy eating and weight loss continues.   Nutrition Intervention: Nutrition rx: 1500 kcal NAS, no sugar added diet; 3 meals per day; low calorie beverages only; physical activity as tolerated  Education/ counseling provided:   Understanding/Motivation/ Ability to follow recommendations: Expect fair to good compliance.   Monitoring and Evaluation: Previous goals: 1) 1-2# weight loss per week-goal not met; 2) 15 minutes physical activity daily- progressing; 3) Attend community cooking class-goal met  Goals for next visit: 1) 1-2# weight loss per week; 2) 30 minutes physical activity daily; 3) Attend community cooking class  Recommendations: 1) Seek out community resources providing cooking classes; 2) Continue to increase length of physical activity (goal of 30 minutes daily for 5 times a week); 3) Keep food diary  F/U: 6-8 weeks.   Melissa [name], RD, LDN Date:08/13/2012 Appt EndTime: 12:54 PM

## 2012-08-13 NOTE — Progress Notes (Signed)
Subjective:    Patient ID: Melissa [name], female    DOB: [birth year], 66 y.o.   MRN: [id number]  HPI Patient is here follow chronic medical problems. She has no specific concerns today. Medications reviewed with patient in detail Labs reviewed She's currently in a trial at [facility] for hypertension her last cardiology note was reviewed she is a change from lisinopril to Cozaar secondary to mild cough. Preventative measures up to date with exception of bone density test  Review of Systems  GEN- denies fatigue, fever, weight loss,weakness, recent illness HEENT- denies eye drainage, change in vision, nasal discharge, CVS- denies chest pain, palpitations RESP- denies SOB, cough, wheeze ABD- denies N/V, change in stools, abd pain GU- denies dysuria, hematuria, dribbling, incontinence MSK- + joint pain, muscle aches, injury Neuro- denies headache, dizziness, syncope, seizure activity      Objective:   Physical Exam GEN- NAD, alert and oriented x3 HEENT- PERRL, EOMI, non injected sclera, pink conjunctiva, MMM, oropharynx clear Neck- Supple,  CVS- RRR, no murmur RESP-CTAB ABD-NABS,soft,NT,ND EXT- No edema Pulses- Radial, DP- 2+        Assessment & Plan:

## 2012-08-13 NOTE — Assessment & Plan Note (Signed)
Pt to see nutritionist, also to increase activity, she is to restart at [facility]

## 2012-08-13 NOTE — Patient Instructions (Addendum)
Continue current medications Avoid the fried foods, fatty foods, bake or grill your foods  Increase activity try to walk at least 5 days a week Set up for bone density F/U 4 months with Dr. Lodema Hong

## 2012-08-13 NOTE — Assessment & Plan Note (Signed)
I will have her take 2 extra doses of potassium- 40mg  BID tomorrow, then resume regular dose Check magnesium with next blood drawn

## 2012-08-13 NOTE — Assessment & Plan Note (Signed)
Bp stable with recent change in cozzaar, currently in study at [facility], no further changes needed

## 2012-08-13 NOTE — Patient Instructions (Signed)
Next appointment scheduled for 09/17/12 at 3:45 PM.

## 2012-08-13 NOTE — Assessment & Plan Note (Signed)
Hb stable, s/p colonoscopy 2007

## 2012-08-13 NOTE — Assessment & Plan Note (Signed)
Mild elevation in TG, overall FLP looks good No change to statin therapy

## 2012-08-31 ENCOUNTER — Telehealth: Payer: Self-pay | Admitting: Family Medicine

## 2012-09-02 NOTE — Telephone Encounter (Signed)
Returned patient call to number listed.  No answer.  No answering machine set up.

## 2012-09-04 NOTE — Telephone Encounter (Signed)
Mailed copy of lab work

## 2012-09-08 ENCOUNTER — Telehealth (HOSPITAL_COMMUNITY): Payer: Self-pay | Admitting: Dietician

## 2012-09-08 NOTE — Telephone Encounter (Signed)
Mailed appointment confirmation letter and instructions for appointment scheduled 09/17/12 at 3:45 PM via [place] [employer].

## 2012-09-15 ENCOUNTER — Telehealth (HOSPITAL_COMMUNITY): Payer: Self-pay | Admitting: Dietician

## 2012-09-15 NOTE — Telephone Encounter (Signed)
Pt reports that she is unable to make her appointment on 09/17/12 at 3:30 PM. Requesting reschedule. Appointment rescheduled for 09/30/12 at 11:00 AM.

## 2012-09-30 ENCOUNTER — Ambulatory Visit: Payer: Medicare Other

## 2012-09-30 ENCOUNTER — Ambulatory Visit (HOSPITAL_COMMUNITY)
Admission: RE | Admit: 2012-09-30 | Discharge: 2012-09-30 | Disposition: A | Discharge status: Home / Self Care | Payer: Medicare Other | Source: Ambulatory Visit | Attending: Family Medicine | Admitting: Family Medicine

## 2012-09-30 ENCOUNTER — Telehealth (HOSPITAL_COMMUNITY): Payer: Self-pay | Admitting: Dietician

## 2012-09-30 DIAGNOSIS — M899 Disorder of bone, unspecified: Secondary | ICD-10-CM | POA: Insufficient documentation

## 2012-09-30 DIAGNOSIS — Z13820 Encounter for screening for osteoporosis: Secondary | ICD-10-CM

## 2012-09-30 DIAGNOSIS — M949 Disorder of cartilage, unspecified: Secondary | ICD-10-CM | POA: Insufficient documentation

## 2012-09-30 NOTE — Telephone Encounter (Signed)
Pt was a no-show for appointment scheduled for 09/30/2012 at 11:00 AM. Sent letter to pt home notifying pt of no-show and requesting rescheduling appointment.

## 2012-12-08 ENCOUNTER — Ambulatory Visit: Payer: Medicare Other | Admitting: Cardiology

## 2012-12-15 ENCOUNTER — Ambulatory Visit: Payer: Medicare Other | Admitting: Cardiology

## 2013-01-01 ENCOUNTER — Encounter: Payer: Self-pay | Admitting: Adult Health

## 2013-01-01 ENCOUNTER — Ambulatory Visit (INDEPENDENT_AMBULATORY_CARE_PROVIDER_SITE_OTHER): Payer: Medicare Other | Admitting: Adult Health

## 2013-01-01 VITALS — BP 127/82 | HR 67 | Ht 65.5 in | Wt 239.2 lb

## 2013-01-01 DIAGNOSIS — R0989 Other specified symptoms and signs involving the circulatory and respiratory systems: Secondary | ICD-10-CM

## 2013-01-01 DIAGNOSIS — I10 Essential (primary) hypertension: Secondary | ICD-10-CM

## 2013-01-01 DIAGNOSIS — E876 Hypokalemia: Secondary | ICD-10-CM

## 2013-01-01 LAB — BASIC METABOLIC PANEL
Creat: 1.19 mg/dL — ABNORMAL HIGH (ref 0.50–1.10)
Potassium: 3.5 mEq/L (ref 3.5–5.3)
Sodium: 139 mEq/L (ref 135–145)

## 2013-01-01 NOTE — Progress Notes (Deleted)
Name: Melissa [name]    DOB: [birth year]  Age: 67 y.o.  MR#: [id number]       PCP:  [name], MD      Insurance: @PAYORNAME @   CC:   No chief complaint on file.   VS BP 127/82  Pulse 67  Ht 5' 5.5" (1.664 m)  Wt 239 lb 4 oz (108.523 kg)  BMI 39.21 kg/m2  SpO2 99%  Weights Current Weight  01/01/13 239 lb 4 oz (108.523 kg)  08/13/12 231 lb (104.781 kg)  08/13/12 233 lb 12.8 oz (106.051 kg)    Blood Pressure  BP Readings from Last 3 Encounters:  01/01/13 127/82  08/13/12 140/78  06/19/12 139/75     Admit date:  (Not on file) Last encounter with RMR:  Visit date not found   Allergy Allergies  Allergen Reactions  . Morphine     Current Outpatient Prescriptions  Medication Sig Dispense Refill  . AMLODIPINE BESYLATE PO Take 10 mg by mouth daily. One tab by mouth once daily      . aspirin (ASPIRIN LOW DOSE) 81 MG EC tablet Take 81 mg by mouth daily. One tab by mouth once daily       . atenolol (TENORMIN) 25 MG tablet Take 25 mg by mouth daily.      . calcium-vitamin D (OSCAL 500/200 D-3) 500 MG tablet Take 1 tablet by mouth 3 (three) times daily. Take 1 tablet by mouth three times a day       . chlorthalidone (HYGROTON) 25 MG tablet Take 1 tablet (25 mg total) by mouth daily.  30 tablet  0  . etodolac (LODINE) 300 MG capsule       . FLUoxetine (PROZAC) 10 MG tablet TAKE ONE TABLET BY MOUTH EVERY DAY  30 tablet  2  . hydrALAZINE (APRESOLINE) 25 MG tablet Take 3 tablets (75 mg total) by mouth 3 (three) times daily.  30 tablet  0  . levothyroxine (SYNTHROID, LEVOTHROID) 100 MCG tablet Take 1 tablet (100 mcg total) by mouth 3 (three) times a week. Monday, Wednesday, Friday  30 tablet  12  . levothyroxine (SYNTHROID, LEVOTHROID) 50 MCG tablet Take 1 tablet (50 mcg total) by mouth 4 (four) times a week. Take on Tuesday, Thursday, Saturday and Sunday  30 tablet  3  . losartan (COZAAR) 100 MG tablet Take 1 tablet (100 mg total) by mouth daily.  30 tablet  12  . lovastatin  (MEVACOR) 40 MG tablet TAKE ONE TABLET BY MOUTH AT BEDTIME  30 tablet  2  . meclizine (GNP MOTION SICKNESS RELIEF) 25 MG tablet Take 1 tablet (25 mg total) by mouth daily. One daily as needed for vertigo  30 tablet  0  . Multiple Vitamins-Minerals (ONE-A-DAY EXTRAS ANTIOXIDANT PO) Take by mouth. Take one tablet by mouth       . Omega-3 Fatty Acids (EQL FISH OIL) 1000 MG CAPS Take by mouth. Take 1 tablet  by mouth two times a day       . potassium chloride SA (K-DUR,KLOR-CON) 20 MEQ tablet Take 20 mEq by mouth 2 (two) times daily.      Marland Kitchen zolpidem (AMBIEN) 10 MG tablet Take 10 mg by mouth at bedtime as needed. One tablet by mouth at bedtime         Discontinued Meds:   There are no discontinued medications.  Patient Active Problem List  Diagnosis  . Hypothyroid  . VITAMIN D DEFICIENCY  . Obesity  . Depression  .  HEARING LOSS  . VERTIGO, INTERMITTENT  . Vitiligo  . Hyperlipidemia  . Hypertension  . Renal cell carcinoma  . Chest pain  . Dyspnea on exertion  . Hypokalemia  . Anemia    LABS No visits with results within 3 Month(s) from this visit. Latest known visit with results is:  Orders Only on 08/06/2012  Component Date Value  . WBC 08/06/2012 6.0   . RBC 08/06/2012 3.60*  . Hemoglobin 08/06/2012 10.9*  . HCT 08/06/2012 31.7*  . MCV 08/06/2012 88.1   . [facility] 08/06/2012 30.3   . MCHC 08/06/2012 34.4   . RDW 08/06/2012 16.6*  . Platelets 08/06/2012 310   . Sodium 08/06/2012 136   . Potassium 08/06/2012 3.3*  . Chloride 08/06/2012 102   . CO2 08/06/2012 32   . Glucose, Bld 08/06/2012 80   . BUN 08/06/2012 15   . Creat 08/06/2012 1.09   . Total Bilirubin 08/06/2012 0.4   . Alkaline Phosphatase 08/06/2012 36*  . AST 08/06/2012 17   . ALT 08/06/2012 11   . Total Protein 08/06/2012 8.0   . Albumin 08/06/2012 3.8   . Calcium 08/06/2012 9.4   . Cholesterol 08/06/2012 133   . Triglycerides 08/06/2012 174*  . HDL 08/06/2012 35*  . Total CHOL/HDL Ratio 08/06/2012 3.8     . VLDL 08/06/2012 35   . LDL Cholesterol 08/06/2012 63   . TSH 08/06/2012 1.036      Results for this Opt Visit:     Results for orders placed in visit on 08/06/12  CBC      Component Value Range   WBC 6.0  4.0 - 10.5 K/uL   RBC 3.60 (*) 3.87 - 5.11 MIL/uL   Hemoglobin 10.9 (*) 12.0 - 15.0 g/dL   HCT 62.6 (*) 94.8 - 54.6 %   MCV 88.1  78.0 - 100.0 fL   MCH 30.3  26.0 - 34.0 pg   MCHC 34.4  30.0 - 36.0 g/dL   RDW 27.0 (*) 35.0 - 09.3 %   Platelets 310  150 - 400 K/uL  COMPREHENSIVE METABOLIC PANEL      Component Value Range   Sodium 136  135 - 145 mEq/L   Potassium 3.3 (*) 3.5 - 5.3 mEq/L   Chloride 102  96 - 112 mEq/L   CO2 32  19 - 32 mEq/L   Glucose, Bld 80  70 - 99 mg/dL   BUN 15  6 - 23 mg/dL   Creat 8.18  2.99 - 3.71 mg/dL   Total Bilirubin 0.4  0.3 - 1.2 mg/dL   Alkaline Phosphatase 36 (*) 39 - 117 U/L   AST 17  0 - 37 U/L   ALT 11  0 - 35 U/L   Total Protein 8.0  6.0 - 8.3 g/dL   Albumin 3.8  3.5 - 5.2 g/dL   Calcium 9.4  8.4 - 69.6 mg/dL  LIPID PANEL      Component Value Range   Cholesterol 133  0 - 200 mg/dL   Triglycerides 789 (*) <150 mg/dL   HDL 35 (*) >38 mg/dL   Total CHOL/HDL Ratio 3.8     VLDL 35  0 - 40 mg/dL   LDL Cholesterol 63  0 - 99 mg/dL  TSH      Component Value Range   TSH 1.036  0.350 - 4.500 uIU/mL    EKG Orders placed in visit on 01/01/13  . EKG 12-LEAD     Prior Assessment and  Plan Problem List as of 01/01/2013          Hypothyroid   Last Assessment & Plan Note   08/13/2012 Office Visit Signed 08/13/2012 12:25 PM by Salley Scarlet, MD    Labs look good, she is on an alternating schedule with her synthroid    VITAMIN D DEFICIENCY   Obesity   Last Assessment & Plan Note   08/13/2012 Office Visit Signed 08/13/2012 12:26 PM by Salley Scarlet, MD    Pt to see nutritionist, also to increase activity, she is to restart at [facility]    Depression   Last Assessment & Plan Note   06/17/2011 Office Visit Signed 06/23/2011  5:22 PM by  [name], MD    Improved and controlled on current medication, no change    HEARING LOSS   VERTIGO, INTERMITTENT   Vitiligo   Last Assessment & Plan Note   06/17/2011 Office Visit Signed 06/23/2011  5:23 PM by [name], MD    Unchanged, no specific management, has seen dermatology in the past    Hyperlipidemia   Last Assessment & Plan Note   08/13/2012 Office Visit Signed 08/13/2012 12:24 PM by Salley Scarlet, MD    Mild elevation in TG, overall FLP looks good No change to statin therapy    Hypertension   Last Assessment & Plan Note   08/13/2012 Office Visit Signed 08/13/2012 12:25 PM by Salley Scarlet, MD    Bp stable with recent change in cozzaar, currently in study at [facility], no further changes needed    Renal cell carcinoma   Chest pain   Dyspnea on exertion   Last Assessment & Plan Note   04/13/2012 Office Visit Signed 04/17/2012  5:16 PM by Kathlen Brunswick, MD    Current symptoms are somewhat variable in their occurrence.  She develops dyspnea with modest effort when she arises from bed, but has better exercise capacity at another times during the day.  A negative cardiac catheterization 5 years ago does not totally exclude the possibility of coronary disease, but certainly renders it quite unlikely. She has no known pulmonary problems; a chest x-ray will be obtained.  Basic laboratory studies Including a complete metabolic profile, BNP level, d-dimer level and TSH have been ordered.  CBC was recently normal.  I've recommended a trial of increased activity since her current symptoms are likely secondary to obesity and physical deconditioning.    Hypokalemia   Last Assessment & Plan Note   08/13/2012 Office Visit Signed 08/13/2012 12:27 PM by Salley Scarlet, MD    I will have her take 2 extra doses of potassium- 40mg  BID tomorrow, then resume regular dose Check magnesium with next blood drawn    Anemia   Last Assessment & Plan Note   08/13/2012 Office Visit  Signed 08/13/2012 12:31 PM by Salley Scarlet, MD    Hb stable, s/p colonoscopy 2007        Imaging: No results found.   FRS Calculation: Score not calculated. Missing: Total Cholesterol

## 2013-01-01 NOTE — Assessment & Plan Note (Signed)
Follow up BMET is ordered.

## 2013-01-01 NOTE — Progress Notes (Signed)
HPI: Melissa [name] is a morbidly 65 y/p patient of Dr. Dietrich Pates we are seeing for ongoing follow up and treatment of hypertension and hypercholesterolemia. She has been in the ER at [facility] since being seen last due to left breast pain. She was treated and released. Currently, she is complaining of cough and congestion in her throat and sometime into her chest. The sputum is white or clear. She is taking OTC muccinex and delsym for symptom relief. She does not have fevers, chills or night sweats.   Allergies  Allergen Reactions  . Morphine     Current Outpatient Prescriptions  Medication Sig Dispense Refill  . AMLODIPINE BESYLATE PO Take 10 mg by mouth daily. One tab by mouth once daily      . aspirin (ASPIRIN LOW DOSE) 81 MG EC tablet Take 81 mg by mouth daily. One tab by mouth once daily       . atenolol (TENORMIN) 25 MG tablet Take 25 mg by mouth daily.      . calcium-vitamin D (OSCAL 500/200 D-3) 500 MG tablet Take 1 tablet by mouth 3 (three) times daily. Take 1 tablet by mouth three times a day       . chlorthalidone (HYGROTON) 25 MG tablet Take 1 tablet (25 mg total) by mouth daily.  30 tablet  0  . etodolac (LODINE) 300 MG capsule       . FLUoxetine (PROZAC) 10 MG tablet TAKE ONE TABLET BY MOUTH EVERY DAY  30 tablet  2  . hydrALAZINE (APRESOLINE) 25 MG tablet Take 3 tablets (75 mg total) by mouth 3 (three) times daily.  30 tablet  0  . levothyroxine (SYNTHROID, LEVOTHROID) 100 MCG tablet Take 1 tablet (100 mcg total) by mouth 3 (three) times a week. Monday, Wednesday, Friday  30 tablet  12  . levothyroxine (SYNTHROID, LEVOTHROID) 50 MCG tablet Take 1 tablet (50 mcg total) by mouth 4 (four) times a week. Take on Tuesday, Thursday, Saturday and Sunday  30 tablet  3  . losartan (COZAAR) 100 MG tablet Take 1 tablet (100 mg total) by mouth daily.  30 tablet  12  . lovastatin (MEVACOR) 40 MG tablet TAKE ONE TABLET BY MOUTH AT BEDTIME  30 tablet  2  . meclizine (GNP MOTION SICKNESS  RELIEF) 25 MG tablet Take 1 tablet (25 mg total) by mouth daily. One daily as needed for vertigo  30 tablet  0  . Multiple Vitamins-Minerals (ONE-A-DAY EXTRAS ANTIOXIDANT PO) Take by mouth. Take one tablet by mouth       . Omega-3 Fatty Acids (EQL FISH OIL) 1000 MG CAPS Take by mouth. Take 1 tablet  by mouth two times a day       . potassium chloride SA (K-DUR,KLOR-CON) 20 MEQ tablet Take 20 mEq by mouth 2 (two) times daily.      Marland Kitchen zolpidem (AMBIEN) 10 MG tablet Take 10 mg by mouth at bedtime as needed. One tablet by mouth at bedtime         Past Medical History  Diagnosis Date  . Vertigo, intermittent   . Hypothyroidism   . Hyperlipidemia   . Obesity   . Hypertension     severe and resistant to treatment; 2008-negative left renal angiogram  . Renal cell carcinoma     Right nephrectomy  . Vitiligo   . Degenerative disc disease, lumbar   . Degenerative disc disease, cervical   . Chest pain 2008    2008-normal coronary angiography  Past Surgical History  Procedure Date  . Tubal ligation 1983    Bilateral  . Nephrectomy     Right; secondary to renal cell carcinoma    ZOX:WRUEAV of systems complete and found to be negative unless listed above  PHYSICAL EXAM BP 127/82  Pulse 67  Ht 5' 5.5" (1.664 m)  Wt 239 lb 4 oz (108.523 kg)  BMI 39.21 kg/m2  SpO2 99%  General: Well developed, well nourished, in no acute distress Head: Eyes PERRLA, No xanthomas.   Normal cephalic and atramatic  Lungs: Clear bilaterally to auscultation and percussion. Some nasal congestion and coughing with throat congestion. Heart: HRRR S1 S2, without MRG.  Pulses are 2+ & equal.            No carotid bruit. No JVD.  No abdominal bruits. No femoral bruits. Abdomen: Bowel sounds are positive, abdomen soft and non-tender without masses or                  Hernia's noted. Msk:  Back normal, normal gait. Normal strength and tone for age. Extremities: No clubbing, cyanosis or edema.  DP +1 Neuro: Alert  and oriented X 3. Psych:  Good affect, responds appropriately  EKG: NSR rate of 69 bpm.  ASSESSMENT AND PLAN

## 2013-01-01 NOTE — Assessment & Plan Note (Signed)
This is chronic for her in the setting of morbid obesity. Wt loss and management is recommended.

## 2013-01-01 NOTE — Patient Instructions (Signed)
Your physician recommends that you schedule a follow-up appointment in: 6 months  Your physician recommends that you return for lab work in: Next week

## 2013-01-01 NOTE — Assessment & Plan Note (Addendum)
Blood pressure is well controlled on current medication regimen. She is to stay away from pseudophedrine preparations while she is treating her cough and cold symptoms. If she becomes worse or having increased sputum production with fever, she is to call Dr. Lodema Hong. On last visit she was found to be hypokalemic. Potassium was provided. Will repeat her BMET.

## 2013-01-04 NOTE — Addendum Note (Signed)
Addended by: Derry Lory A on: 01/04/2013 11:55 AM   Modules accepted: Orders

## 2013-01-04 NOTE — Addendum Note (Signed)
Addended by: Abner Greenspan on: 01/04/2013 01:19 PM   Modules accepted: Orders

## 2013-01-14 LAB — HEPATIC FUNCTION PANEL
Albumin: 3.9 g/dL (ref 3.5–5.2)
Alkaline Phosphatase: 41 U/L (ref 39–117)
Indirect Bilirubin: 0.4 mg/dL (ref 0.0–0.9)
Total Bilirubin: 0.5 mg/dL (ref 0.3–1.2)

## 2013-01-14 LAB — CBC WITH DIFFERENTIAL/PLATELET
Basophils Relative: 0 % (ref 0–1)
Eosinophils Absolute: 0.1 10*3/uL (ref 0.0–0.7)
MCH: 30.4 pg (ref 26.0–34.0)
MCHC: 35.1 g/dL (ref 30.0–36.0)
Neutrophils Relative %: 65 % (ref 43–77)
Platelets: 298 10*3/uL (ref 150–400)

## 2013-01-14 LAB — LIPID PANEL
HDL: 40 mg/dL (ref >39)
LDL Cholesterol: 71 mg/dL (ref 0–99)
VLDL: 29 mg/dL (ref 0–40)

## 2013-01-14 LAB — FERRITIN: Ferritin: 81 ng/mL (ref 10–291)

## 2013-01-14 LAB — HEMOGLOBIN A1C: Mean Plasma Glucose: 120 mg/dL — ABNORMAL HIGH (ref <117)

## 2013-01-14 LAB — TSH: TSH: 0.773 u[IU]/mL (ref 0.350–4.500)

## 2013-01-15 LAB — VITAMIN D 25 HYDROXY (VIT D DEFICIENCY, FRACTURES): Vit D, 25-Hydroxy: 29 ng/mL — ABNORMAL LOW (ref 30–89)

## 2013-01-21 ENCOUNTER — Ambulatory Visit (INDEPENDENT_AMBULATORY_CARE_PROVIDER_SITE_OTHER): Payer: Medicare Other | Admitting: Family Medicine

## 2013-01-21 ENCOUNTER — Encounter: Payer: Self-pay | Admitting: Family Medicine

## 2013-01-21 VITALS — BP 152/74 | HR 82 | Resp 16 | Ht 65.0 in | Wt 242.0 lb

## 2013-01-21 DIAGNOSIS — R5383 Other fatigue: Secondary | ICD-10-CM

## 2013-01-21 DIAGNOSIS — R5381 Other malaise: Secondary | ICD-10-CM

## 2013-01-21 DIAGNOSIS — E039 Hypothyroidism, unspecified: Secondary | ICD-10-CM

## 2013-01-21 DIAGNOSIS — Z0000 Encounter for general adult medical examination without abnormal findings: Secondary | ICD-10-CM

## 2013-01-21 DIAGNOSIS — R7301 Impaired fasting glucose: Secondary | ICD-10-CM

## 2013-01-21 DIAGNOSIS — E785 Hyperlipidemia, unspecified: Secondary | ICD-10-CM

## 2013-01-21 MED ORDER — FLUOXETINE HCL 10 MG PO TABS: ORAL_TABLET | ORAL | Status: DC

## 2013-01-21 NOTE — Progress Notes (Signed)
Subjective:    Patient ID: Melissa [name], female    DOB: 08-Jul-1946, 67 y.o.   MRN: [id number]  HPI Preventive Screening-Counseling & Management   Patient present here today for a Medicare annual wellness visit.   Current Problems (verified)   Medications Prior to Visit Allergies (verified)   PAST HISTORY  Family History  Social History Divorced x 27 years, mother of 2 daughters. [place] never, no alcohol or street drug   Risk Factors  Current exercise habits: commit to 5 days per week of exercise   Dietary issues discussed:low carb, low fat   Cardiac risk factors:   Depression Screen  (Note: if answer to either of the following is "Yes", a more complete depression screening is indicated)   Over the past two weeks, have you felt down, depressed or hopeless? yes , acutely grieving loss of a dear friend Over the past two weeks, have you felt little interest or pleasure in doing things? yes Have you lost interest or pleasure in daily life? some  Do you often feel hopeless? No  Do you cry easily over simple problems? No   Activities of Daily Living  In your present state of health, do you have any difficulty performing the following activities?  Driving?: No Managing money?: No Feeding yourself?:No Getting from bed to chair?:No Climbing a flight of stairs?:yes Preparing food and eating?:No Bathing or showering?:No Getting dressed?:No Getting to the toilet?:No Using the toilet?:No Moving around from place to place?: No  Fall Risk Assessment In the past year have you fallen or had a near fall?:No Are you currently taking any medications that make you dizziness?:No   Hearing Difficulties: No Do you often ask people to speak up or repeat themselves?:No Do you experience ringing or noises in your ears?:No Do you have difficulty understanding soft or whispered voices?:No  Cognitive Testing  Alert? Yes Normal Appearance?Yes  Oriented to person? Yes Place?  Yes  Time? Yes  Displays appropriate judgment?Yes  Can read the correct time from a watch face? yes Are you having problems remembering things?No  Advanced Directives have been discussed with the patient?Yes    List the Names of Other Physician/Practitioners you currently use: Dr Dietrich Pates, [facility] blood pressure  Study at [facility] any recent Medical Services you may have received from other than [facility] providers in the past year (date may be approximate).   Assessment:    Annual Wellness Exam   Plan:    During the course of the visit the patient was educated and counseled about appropriate screening and preventive services including:  A healthy diet is rich in fruit, vegetables and whole grains. Poultry fish, nuts and beans are a healthy choice for protein rather then red meat. A low sodium diet and drinking 64 ounces of water daily is generally recommended. Oils and sweet should be limited. Carbohydrates especially for those who are diabetic or overweight, should be limited to 30-45 gram per meal. It is important to eat on a regular schedule, at least 3 times daily. Snacks should be primarily fruits, vegetables or nuts. It is important that you exercise regularly at least 30 minutes 5 times a week. If you develop chest pain, have severe difficulty breathing, or feel very tired, stop exercising immediately and seek medical attention  Immunization reviewed and updated. Cancer screening reviewed and updated    Patient Instructions (the written plan) was given to the patient.  Medicare Attestation  I have personally reviewed:  The patient's medical and  social history  Their use of alcohol, tobacco or illicit drugs  Their current medications and supplements  The patient's functional ability including ADLs,fall risks, home safety risks, cognitive, and hearing and visual impairment  Diet and physical activities  Evidence for depression or mood disorders  The patient's weight,  height, BMI, and visual acuity have been recorded in the chart. I have made referrals, counseling, and provided education to the patient based on review of the above and I have provided the patient with a written personalized care plan for preventive services.      Review of Systems     Objective:   Physical Exam        Assessment & Plan:

## 2013-01-21 NOTE — Patient Instructions (Addendum)
Pelvic and breast exam in 4.5 month   Your blood work looks good, except the sugar is too high, you need to reduce your sweets and carbs so you do not become a diabetic  Please accept my condolence , if you need therapy call and let me know  Continue lovastatin and thyroid meds as before  OK to stop the fish oil  I do not recommend ibuprofen or etodolac regularly for pain, tylenol 325 mg is safer use 1 tab if needed.  Vit D is 29, normal is above 30.  You need to take calcium 1200mg  with vit D 1000Iu daily

## 2013-01-22 ENCOUNTER — Telehealth: Payer: Self-pay | Admitting: Family Medicine

## 2013-01-23 DIAGNOSIS — Z0000 Encounter for general adult medical examination without abnormal findings: Secondary | ICD-10-CM | POA: Insufficient documentation

## 2013-01-23 NOTE — Assessment & Plan Note (Signed)
Annual wellness completed as documented. Pt has no significant limitations, and is fully functional and able to care for herself. Currently experiencing grief due to loss of a dear friend, no need for therapy perceived at this time

## 2013-01-26 ENCOUNTER — Other Ambulatory Visit: Payer: Self-pay | Admitting: Family Medicine

## 2013-01-29 NOTE — Telephone Encounter (Signed)
Copy will be mailed

## 2013-02-08 ENCOUNTER — Other Ambulatory Visit: Payer: Self-pay | Admitting: *Deleted

## 2013-02-08 ENCOUNTER — Encounter: Payer: Self-pay | Admitting: *Deleted

## 2013-02-08 DIAGNOSIS — R0609 Other forms of dyspnea: Secondary | ICD-10-CM

## 2013-02-08 DIAGNOSIS — E876 Hypokalemia: Secondary | ICD-10-CM

## 2013-02-08 DIAGNOSIS — I10 Essential (primary) hypertension: Secondary | ICD-10-CM

## 2013-02-15 LAB — BASIC METABOLIC PANEL
BUN: 12 mg/dL (ref 6–23)
Chloride: 104 mEq/L (ref 96–112)
Potassium: 3.5 mEq/L (ref 3.5–5.3)

## 2013-02-17 ENCOUNTER — Encounter: Payer: Self-pay | Admitting: *Deleted

## 2013-03-25 ENCOUNTER — Telehealth: Payer: Self-pay | Admitting: Family Medicine

## 2013-03-25 NOTE — Telephone Encounter (Signed)
Call this doctor please at [phone]

## 2013-03-29 ENCOUNTER — Other Ambulatory Visit: Payer: Self-pay | Admitting: Family Medicine

## 2013-03-29 ENCOUNTER — Encounter: Payer: Self-pay | Admitting: Family Medicine

## 2013-03-29 ENCOUNTER — Ambulatory Visit (HOSPITAL_COMMUNITY)
Admission: RE | Admit: 2013-03-29 | Discharge: 2013-03-29 | Disposition: A | Discharge status: Home / Self Care | Payer: Medicare Other | Source: Ambulatory Visit | Attending: Family Medicine | Admitting: Family Medicine

## 2013-03-29 ENCOUNTER — Ambulatory Visit (INDEPENDENT_AMBULATORY_CARE_PROVIDER_SITE_OTHER): Payer: Medicare Other | Admitting: Family Medicine

## 2013-03-29 VITALS — BP 150/80 | HR 78 | Resp 16 | Ht 65.0 in | Wt 240.0 lb

## 2013-03-29 DIAGNOSIS — J849 Interstitial pulmonary disease, unspecified: Secondary | ICD-10-CM

## 2013-03-29 DIAGNOSIS — J8410 Pulmonary fibrosis, unspecified: Secondary | ICD-10-CM | POA: Insufficient documentation

## 2013-03-29 DIAGNOSIS — I10 Essential (primary) hypertension: Secondary | ICD-10-CM

## 2013-03-29 DIAGNOSIS — R05 Cough: Secondary | ICD-10-CM

## 2013-03-29 DIAGNOSIS — R059 Cough, unspecified: Secondary | ICD-10-CM | POA: Insufficient documentation

## 2013-03-29 DIAGNOSIS — R0989 Other specified symptoms and signs involving the circulatory and respiratory systems: Secondary | ICD-10-CM

## 2013-03-29 DIAGNOSIS — E785 Hyperlipidemia, unspecified: Secondary | ICD-10-CM

## 2013-03-29 DIAGNOSIS — R0609 Other forms of dyspnea: Secondary | ICD-10-CM

## 2013-03-29 DIAGNOSIS — E039 Hypothyroidism, unspecified: Secondary | ICD-10-CM

## 2013-03-29 MED ORDER — LOVASTATIN 40 MG PO TABS: ORAL_TABLET | ORAL | Status: DC

## 2013-03-29 NOTE — Telephone Encounter (Signed)
Pt seen in office today.

## 2013-03-29 NOTE — Patient Instructions (Addendum)
F/u as before.  You need to get a CXR  When you leave here, if abnormal you will be referred to Dr Brunetta Jeans are referred back to cardiology due to increased fatigue, chest discomfort and leg swelling

## 2013-03-29 NOTE — Progress Notes (Signed)
Subjective:    Patient ID: Melissa [name], female    DOB: 03/02/1946, 67 y.o.   MRN: [id number]  HPI Recently seen at the blood pressure study clinic at [facility], concerns sent re basilar crackles and pt reports single episode several nights ago with wheeze andcough productive of brown sputum, 1 night only. No fever , chills sore throat cough or sinus pressure Does report some leg swelling, feeling a "band across her upper abdomen, she uses 2 pillows for years, no pND, noted.c/o intermittent palpitations, though recently seen at cardiology, she is referred specifically with these complaints, espescialy at prodding of practitioner who follows her at the clinic at [facility]     Review of Systems See HPI Denies recent fever or chills. Denies sinus pressure, nasal congestion, ear pain or sore throat.   Denies abdominal pain, nausea, vomiting,diarrhea or constipation.   Denies dysuria, frequency, hesitancy or incontinence. Denies joint pain, swelling and limitation in mobility. Denies headaches, seizures, numbness, or tingling. Denies depression, anxiety or insomnia. Denies skin break down or rash.        Objective:   Physical Exam Patient alert and oriented and in no cardiopulmonary distress.  HEENT: No facial asymmetry, EOMI, no sinus tenderness,  oropharynx pink and moist.  Neck supple no adenopathy.  Chest: adequate though decreased air entry, bibasilar crackles, no wheezes  CVS: S1, S2 no murmurs, no S3.  ABD: Soft non tender. Bowel sounds normal.  Ext: No edema  MS: Adequate ROM spine, shoulders, hips and knees.  Skin: Intact, no ulcerations or rash noted.  Psych: Good eye contact, normal affect. Memory intact not anxious or depressed appearing.  CNS: CN 2-12 intact, power, tone and sensation normal throughout.        Assessment & Plan:

## 2013-03-30 ENCOUNTER — Telehealth: Payer: Self-pay | Admitting: Family Medicine

## 2013-03-30 NOTE — Telephone Encounter (Signed)
Called back pt and again discussed the report and fact that this is not new and she is referred to [facility]

## 2013-03-31 ENCOUNTER — Ambulatory Visit: Payer: Medicare Other | Admitting: Family Medicine

## 2013-04-02 ENCOUNTER — Ambulatory Visit (INDEPENDENT_AMBULATORY_CARE_PROVIDER_SITE_OTHER): Payer: Medicare Other | Admitting: Adult Health

## 2013-04-02 ENCOUNTER — Encounter: Payer: Self-pay | Admitting: Adult Health

## 2013-04-02 VITALS — BP 130/64 | HR 61 | Ht 65.5 in | Wt 241.0 lb

## 2013-04-02 DIAGNOSIS — I10 Essential (primary) hypertension: Secondary | ICD-10-CM

## 2013-04-02 DIAGNOSIS — R0609 Other forms of dyspnea: Secondary | ICD-10-CM

## 2013-04-02 LAB — HEMOGLOBIN A1C
Hgb A1c MFr Bld: 5.5 % (ref <5.7)
Mean Plasma Glucose: 111 mg/dL (ref <117)

## 2013-04-02 LAB — BASIC METABOLIC PANEL
Calcium: 9.6 mg/dL (ref 8.4–10.5)
Glucose, Bld: 73 mg/dL (ref 70–99)
Potassium: 3.8 mEq/L (ref 3.5–5.3)
Sodium: 140 mEq/L (ref 135–145)

## 2013-04-02 NOTE — Progress Notes (Deleted)
Name: Melissa [name]    DOB: 1946/02/06  Age: 67 y.o.  MR#: [id number]       PCP:  [name], MD      Insurance: Payor: Cleatrice Burke MEDICARE  Plan: AARP MEDICARE COMPLETE  Product Type: *No Product type*    CC:    Chief Complaint  Patient presents with  . Hypertension    VS Filed Vitals:   04/02/13 1141  BP: 130/64  Pulse: 61  Height: 5' 5.5" (1.664 m)  Weight: 241 lb (109.317 kg)    Weights Current Weight  04/02/13 241 lb (109.317 kg)  03/29/13 240 lb (108.863 kg)  01/21/13 242 lb (109.77 kg)    Blood Pressure  BP Readings from Last 3 Encounters:  04/02/13 130/64  03/29/13 150/80  01/21/13 152/74     Admit date:  (Not on file) Last encounter with [facility]:  01/01/2013   Allergy Morphine  Current Outpatient Prescriptions  Medication Sig Dispense Refill  . AMLODIPINE BESYLATE PO Take 10 mg by mouth daily. One tab by mouth once daily      . aspirin (ASPIRIN LOW DOSE) 81 MG EC tablet Take 81 mg by mouth daily. One tab by mouth once daily       . atenolol (TENORMIN) 25 MG tablet Take 25 mg by mouth daily.      . Azilsartan Medoxomil 40 MG TABS Take 1 tablet by mouth every morning.      . calcium-vitamin D (OSCAL 500/200 D-3) 500 MG tablet Take 1 tablet by mouth 3 (three) times daily. Take 1 tablet by mouth three times a day       . FLUoxetine (PROZAC) 10 MG tablet TAKE ONE TABLET BY MOUTH EVERY DAY  30 tablet  5  . furosemide (LASIX) 20 MG tablet Take 20 mg by mouth. TAKE TWO TABLETS IN THE MORNING AND TWO TABLETS IN THE EVENING      . hydrALAZINE (APRESOLINE) 25 MG tablet Take 50 mg by mouth 3 (three) times daily.      . hydrALAZINE (APRESOLINE) 25 MG tablet Take 25 mg by mouth 3 (three) times daily.      Marland Kitchen levothyroxine (SYNTHROID, LEVOTHROID) 100 MCG tablet Take 100 mcg by mouth. TAKE ONE TABLET  BY MOUTH THREE TIMES A WEEK      . levothyroxine (SYNTHROID, LEVOTHROID) 50 MCG tablet Take 50 mcg by mouth. TAKE ONE TABLET BY MOUTH FOUR TIMES A WEEK      .  lovastatin (MEVACOR) 40 MG tablet TAKE ONE TABLET BY MOUTH AT BEDTIME  30 tablet  3  . Multiple Vitamins-Minerals (ONE-A-DAY EXTRAS ANTIOXIDANT PO) Take by mouth. Take one tablet by mouth       . potassium chloride SA (K-DUR,KLOR-CON) 20 MEQ tablet Take 20 mEq by mouth 2 (two) times daily.       No current facility-administered medications for this visit.    Discontinued Meds:    Medications Discontinued During This Encounter  Medication Reason  . Omega-3 Fatty Acids (EQL FISH OIL) 1000 MG CAPS Error  . levothyroxine (SYNTHROID, LEVOTHROID) 100 MCG tablet Error  . losartan (COZAAR) 100 MG tablet Error    Patient Active Problem List  Diagnosis  . Hypothyroid  . VITAMIN D DEFICIENCY  . Obesity  . Depression  . HEARING LOSS  . VERTIGO, INTERMITTENT  . Vitiligo  . Hyperlipidemia  . Hypertension  . Renal cell carcinoma  . Chest pain  . Dyspnea on exertion  . Anemia  . Routine general  medical examination at a health care facility  . Cough    LABS    Component Value Date/Time   NA 139 02/08/2013 1515   NA 139 01/01/2013 1218   NA 136 08/06/2012 1103   K 3.5 02/08/2013 1515   K 3.5 01/01/2013 1218   K 3.3* 08/06/2012 1103   CL 104 02/08/2013 1515   CL 103 01/01/2013 1218   CL 102 08/06/2012 1103   CO2 28 02/08/2013 1515   CO2 30 01/01/2013 1218   CO2 32 08/06/2012 1103   GLUCOSE 97 02/08/2013 1515   GLUCOSE 74 01/01/2013 1218   GLUCOSE 80 08/06/2012 1103   BUN 12 02/08/2013 1515   BUN 16 01/01/2013 1218   BUN 15 08/06/2012 1103   CREATININE 1.13* 02/08/2013 1515   CREATININE 1.19* 01/01/2013 1218   CREATININE 1.09 08/06/2012 1103   CREATININE 1.09 11/20/2010 0109   CREATININE 0.93 05/08/2010 0703   CREATININE 1.14 04/04/2010 2023   CALCIUM 9.7 02/08/2013 1515   CALCIUM 9.4 01/01/2013 1218   CALCIUM 9.4 08/06/2012 1103   GFRNONAA >60 05/08/2010 0703   GFRNONAA 36* 12/16/2007 0420   GFRNONAA 36* 12/15/2007 0440   GFRAA  Value: >60        The eGFR has been calculated using the MDRD  equation. This calculation has not been validated in all clinical situations. eGFR's persistently <60 mL/min signify possible Chronic Kidney Disease. 05/08/2010 0703   GFRAA  Value: 44        The eGFR has been calculated using the MDRD equation. This calculation has not been validated in all clinical* 12/16/2007 0420   GFRAA  Value: 43        The eGFR has been calculated using the MDRD equation. This calculation has not been validated in all clinical* 12/15/2007 0440   CMP     Component Value Date/Time   NA 139 02/08/2013 1515   K 3.5 02/08/2013 1515   CL 104 02/08/2013 1515   CO2 28 02/08/2013 1515   GLUCOSE 97 02/08/2013 1515   BUN 12 02/08/2013 1515   CREATININE 1.13* 02/08/2013 1515   CREATININE 1.09 11/20/2010 0109   CALCIUM 9.7 02/08/2013 1515   PROT 8.1 01/14/2013 1025   ALBUMIN 3.9 01/14/2013 1025   AST 14 01/14/2013 1025   ALT 12 01/14/2013 1025   ALKPHOS 41 01/14/2013 1025   BILITOT 0.5 01/14/2013 1025   GFRNONAA >60 05/08/2010 0703   GFRAA  Value: >60        The eGFR has been calculated using the MDRD equation. This calculation has not been validated in all clinical situations. eGFR's persistently <60 mL/min signify possible Chronic Kidney Disease. 05/08/2010 0703       Component Value Date/Time   WBC 6.7 01/14/2013 1025   WBC 6.0 08/06/2012 1103   WBC 6.7 07/31/2011 1245   HGB 11.4* 01/14/2013 1025   HGB 10.9* 08/06/2012 1103   HGB 11.1* 07/31/2011 1245   HCT 32.5* 01/14/2013 1025   HCT 31.7* 08/06/2012 1103   HCT 32.1* 07/31/2011 1245   MCV 86.7 01/14/2013 1025   MCV 88.1 08/06/2012 1103   MCV 87.2 07/31/2011 1245    Lipid Panel     Component Value Date/Time   CHOL 140 01/14/2013 1025   TRIG 147 01/14/2013 1025   HDL 40 01/14/2013 1025   CHOLHDL 3.5 01/14/2013 1025   VLDL 29 01/14/2013 1025   LDLCALC 71 01/14/2013 1025    ABG No results found for this basename: phart, pco2,  pco2art, po2, po2art, hco3, tco2, acidbasedef, o2sat     Lab Results  Component Value Date   TSH 0.773 01/14/2013    BNP (last 3 results) No results found for this basename: PROBNP,  in the last 8760 hours Cardiac Panel (last 3 results) No results found for this basename: CKTOTAL, CKMB, TROPONINI, RELINDX,  in the last 72 hours  Iron/TIBC/Ferritin    Component Value Date/Time   IRON 74 01/14/2013 1025   TIBC 302 07/31/2011 1245   FERRITIN 81 01/14/2013 1025     EKG Orders placed in visit on 01/01/13  . EKG 12-LEAD     Prior Assessment and Plan Problem List as of 04/02/2013     ICD-9-CM     Cardiology Problems   Hyperlipidemia   Last Assessment & Plan   08/13/2012 Office Visit Written 08/13/2012 12:24 PM by Salley Scarlet, MD     Mild elevation in TG, overall FLP looks good No change to statin therapy    Hypertension   Last Assessment & Plan   01/01/2013 Office Visit Edited 01/01/2013 12:26 PM by Jodelle Gross, NP     Blood pressure is well controlled on current medication regimen. She is to stay away from pseudophedrine preparations while she is treating her cough and cold symptoms. If she becomes worse or having increased sputum production with fever, she is to call Dr. Lodema Hong. On last visit she was found to be hypokalemic. Potassium was provided. Will repeat her BMET.      Other   Hypothyroid   Last Assessment & Plan   08/13/2012 Office Visit Written 08/13/2012 12:25 PM by Salley Scarlet, MD     Labs look good, she is on an alternating schedule with her synthroid    VITAMIN D DEFICIENCY   Obesity   Last Assessment & Plan   08/13/2012 Office Visit Written 08/13/2012 12:26 PM by Salley Scarlet, MD     Pt to see nutritionist, also to increase activity, she is to restart at [facility]    Depression   Last Assessment & Plan   06/17/2011 Office Visit Written 06/23/2011  5:22 PM by [name], MD     Improved and controlled on current medication, no change    HEARING LOSS   VERTIGO, INTERMITTENT   Vitiligo   Last Assessment & Plan   06/17/2011 Office Visit Written 06/23/2011  5:23 PM  by [name], MD     Unchanged, no specific management, has seen dermatology in the past    Renal cell carcinoma   Chest pain   Dyspnea on exertion   Last Assessment & Plan   01/01/2013 Office Visit Written 01/01/2013 12:27 PM by Jodelle Gross, NP     This is chronic for her in the setting of morbid obesity. Wt loss and management is recommended.    Anemia   Last Assessment & Plan   08/13/2012 Office Visit Written 08/13/2012 12:31 PM by Salley Scarlet, MD     Hb stable, s/p colonoscopy 2007    Routine general medical examination at a health care facility   Last Assessment & Plan   01/21/2013 Office Visit Written 01/23/2013  5:03 PM by Kerri Perches, MD     Annual wellness completed as documented. Pt has no significant limitations, and is fully functional and able to care for herself. Currently experiencing grief due to loss of a dear friend, no need for therapy perceived at this time     Cough  Imaging: Dg Chest 2 View  03/29/2013  *RADIOLOGY REPORT*  Clinical Data: Cough.  Crackles at the right lung base.  CHEST - 2 VIEW  Comparison: 04/13/2012 and 05/17/2011 and a CT scan of the chest dated 12/11/2007  Findings: The bowel within the heart size and pulmonary vascularity are normal.  The patient has chronic interstitial lung disease at both bases, more prominent on the right than the left.  There are multiple tiny cystic changes at both bases posteriorly as demonstrated on the prior CT scan.  No acute infiltrates or effusions.  No acute osseous abnormality.  IMPRESSION: No acute abnormalities.  Chronic interstitial disease at the lung bases posteriorly, right greater than left.   Original Report Authenticated By: Francene Boyers, M.D.

## 2013-04-02 NOTE — Patient Instructions (Addendum)
Your physician recommends that you schedule a follow-up appointment in: 6 months  

## 2013-04-02 NOTE — Progress Notes (Signed)
HPI: [name] is a morbidly obese 67 year old patient of Dr. Dietrich Pates where following for ongoing assessment of treatment of hypertension hypercholesterolemia. He was last seen in the office on January 2014 after ER visit for left breast pain. She was also found to be hypokalemic with a potassium replacement and follow up BMET. BMET was completed on 02/08/2013 with an level of 3.5. The a chest x-ray completed on 03/29/2013 revealing no acute abnormalities but chronic interstitial disease at the lung bases posteriorly right greater than left. She was referred to Dr. Juanetta Gosling.       Has some complaints of fatigue, and band-like pressure around her waist. She admits to eating salty foods with some fluid retention and BP elevation.      Allergies  Allergen Reactions  . Morphine     Current Outpatient Prescriptions  Medication Sig Dispense Refill  . AMLODIPINE BESYLATE PO Take 10 mg by mouth daily. One tab by mouth once daily      . aspirin (ASPIRIN LOW DOSE) 81 MG EC tablet Take 81 mg by mouth daily. One tab by mouth once daily       . atenolol (TENORMIN) 25 MG tablet Take 25 mg by mouth daily.      . Azilsartan Medoxomil 40 MG TABS Take 1 tablet by mouth every morning.      . calcium-vitamin D (OSCAL 500/200 D-3) 500 MG tablet Take 1 tablet by mouth 3 (three) times daily. Take 1 tablet by mouth three times a day       . FLUoxetine (PROZAC) 10 MG tablet TAKE ONE TABLET BY MOUTH EVERY DAY  30 tablet  5  . furosemide (LASIX) 20 MG tablet Take 20 mg by mouth. TAKE TWO TABLETS IN THE MORNING AND TWO TABLETS IN THE EVENING      . hydrALAZINE (APRESOLINE) 25 MG tablet Take 50 mg by mouth 3 (three) times daily.      . hydrALAZINE (APRESOLINE) 25 MG tablet Take 25 mg by mouth 3 (three) times daily.      Marland Kitchen levothyroxine (SYNTHROID, LEVOTHROID) 100 MCG tablet Take 100 mcg by mouth. TAKE ONE TABLET  BY MOUTH THREE TIMES A WEEK      . levothyroxine (SYNTHROID, LEVOTHROID) 50 MCG tablet Take 50 mcg by  mouth. TAKE ONE TABLET BY MOUTH FOUR TIMES A WEEK      . lovastatin (MEVACOR) 40 MG tablet TAKE ONE TABLET BY MOUTH AT BEDTIME  30 tablet  3  . Multiple Vitamins-Minerals (ONE-A-DAY EXTRAS ANTIOXIDANT PO) Take by mouth. Take one tablet by mouth       . potassium chloride SA (K-DUR,KLOR-CON) 20 MEQ tablet Take 20 mEq by mouth 2 (two) times daily.       No current facility-administered medications for this visit.    Past Medical History  Diagnosis Date  . Vertigo, intermittent   . Hypothyroidism   . Hyperlipidemia   . Obesity   . Hypertension     severe and resistant to treatment; 2008-negative left renal angiogram  . Renal cell carcinoma     Right nephrectomy  . Vitiligo   . Degenerative disc disease, lumbar   . Degenerative disc disease, cervical   . Chest pain 2008    2008-normal coronary angiography    Past Surgical History  Procedure Laterality Date  . Tubal ligation  1983    Bilateral  . Nephrectomy      Right; secondary to renal cell carcinoma    [id number] of systems complete  and found to be negative unless listed above PHYSICAL EXAM BP 130/64  Pulse 61  Ht 5' 5.5" (1.664 m)  Wt 241 lb (109.317 kg)  BMI 39.48 kg/m2  General: Well developed, well nourished, in no acute distress Head: Eyes PERRLA, No xanthomas.   Normal cephalic and atramatic  Lungs: Clear bilaterally to auscultation with mild bibasilar crackles. Heart: HRRR S1 S2, without MRG.  Pulses are 2+ & equal.            No carotid bruit. No JVD.  No abdominal bruits. No femoral bruits. Abdomen: Bowel sounds are positive, abdomen soft and non-tender without masses or                  Hernia's noted. Msk:  Back normal, normal gait. Normal strength and tone for age. Extremities: No clubbing, cyanosis or edema.  DP +1 Neuro: Alert and oriented X 3. Psych:  Good affect, responds appropriately   ASSESSMENT AND PLAN

## 2013-04-02 NOTE — Assessment & Plan Note (Signed)
Chest x-ray revealed mild interstitial lung disease. She is being referred to Dr. Juanetta Gosling for further evaluation and recommendations. She also has morbid obesity which may be playing a role in her breathing status.

## 2013-04-02 NOTE — Assessment & Plan Note (Signed)
Blood pressure is well-controlled currently. She is due to have followup labs today at Dr. Lodema Hong has ordered for kidney function and potassium status. We will not make any changes in her current medication regimen at this time. We will see her again in 6 months unless she becomes symptomatic.

## 2013-04-04 NOTE — Assessment & Plan Note (Signed)
Sub optimal control wit c/o fatigue , palpitations and inerermittent chest discomfort, will refer to cardiology for further eva; She is enrolled in a hypertension study through [facility] and was recently advised by the mid level practioner that these symptoms need to be addressed

## 2013-04-04 NOTE — Assessment & Plan Note (Signed)
Controlled, no change in medication  

## 2013-04-04 NOTE — Assessment & Plan Note (Signed)
Hyperlipidemia:Low fat diet discussed and encouraged.  Controlled, no change in medication   

## 2013-04-04 NOTE — Assessment & Plan Note (Signed)
Cough, abnorma;l CXr refer for pulmonary eval

## 2013-04-08 ENCOUNTER — Encounter (HOSPITAL_COMMUNITY): Payer: Self-pay | Admitting: *Deleted

## 2013-04-08 ENCOUNTER — Emergency Department (HOSPITAL_COMMUNITY)
Admission: EM | Admit: 2013-04-08 | Discharge: 2013-04-09 | Disposition: A | Discharge status: Home / Self Care | Payer: Medicare Other | Attending: Emergency Medicine | Admitting: Emergency Medicine

## 2013-04-08 ENCOUNTER — Telehealth: Payer: Self-pay | Admitting: Family Medicine

## 2013-04-08 DIAGNOSIS — I10 Essential (primary) hypertension: Secondary | ICD-10-CM | POA: Insufficient documentation

## 2013-04-08 DIAGNOSIS — Z905 Acquired absence of kidney: Secondary | ICD-10-CM | POA: Insufficient documentation

## 2013-04-08 DIAGNOSIS — R350 Frequency of micturition: Secondary | ICD-10-CM | POA: Insufficient documentation

## 2013-04-08 DIAGNOSIS — Z872 Personal history of diseases of the skin and subcutaneous tissue: Secondary | ICD-10-CM | POA: Insufficient documentation

## 2013-04-08 DIAGNOSIS — Z85528 Personal history of other malignant neoplasm of kidney: Secondary | ICD-10-CM | POA: Insufficient documentation

## 2013-04-08 DIAGNOSIS — Z9851 Tubal ligation status: Secondary | ICD-10-CM | POA: Insufficient documentation

## 2013-04-08 DIAGNOSIS — E039 Hypothyroidism, unspecified: Secondary | ICD-10-CM | POA: Insufficient documentation

## 2013-04-08 DIAGNOSIS — M5030 Other cervical disc degeneration, unspecified cervical region: Secondary | ICD-10-CM | POA: Insufficient documentation

## 2013-04-08 DIAGNOSIS — M5137 Other intervertebral disc degeneration, lumbosacral region: Secondary | ICD-10-CM | POA: Insufficient documentation

## 2013-04-08 DIAGNOSIS — Z7982 Long term (current) use of aspirin: Secondary | ICD-10-CM | POA: Insufficient documentation

## 2013-04-08 DIAGNOSIS — N390 Urinary tract infection, site not specified: Secondary | ICD-10-CM

## 2013-04-08 DIAGNOSIS — E669 Obesity, unspecified: Secondary | ICD-10-CM | POA: Insufficient documentation

## 2013-04-08 DIAGNOSIS — Z79899 Other long term (current) drug therapy: Secondary | ICD-10-CM | POA: Insufficient documentation

## 2013-04-08 DIAGNOSIS — R109 Unspecified abdominal pain: Secondary | ICD-10-CM

## 2013-04-08 DIAGNOSIS — M51379 Other intervertebral disc degeneration, lumbosacral region without mention of lumbar back pain or lower extremity pain: Secondary | ICD-10-CM | POA: Insufficient documentation

## 2013-04-08 DIAGNOSIS — E785 Hyperlipidemia, unspecified: Secondary | ICD-10-CM | POA: Insufficient documentation

## 2013-04-08 LAB — URINALYSIS, ROUTINE W REFLEX MICROSCOPIC
Bilirubin Urine: NEGATIVE
Glucose, UA: NEGATIVE mg/dL
Hgb urine dipstick: NEGATIVE
Leukocytes, UA: NEGATIVE
Nitrite: NEGATIVE
Specific Gravity, Urine: >1.03 — ABNORMAL HIGH (ref 1.005–1.030)
Urobilinogen, UA: 0.2 mg/dL (ref 0.0–1.0)
pH: 5.5 (ref 5.0–8.0)

## 2013-04-08 LAB — URINE MICROSCOPIC-ADD ON

## 2013-04-08 NOTE — Telephone Encounter (Signed)
The one on her med list is entered as historic (lasix 20 TWO tabs TWICE daily) and I called her pharmacy and they have no record of her ever getting it from there. How do you want me to send in?

## 2013-04-08 NOTE — ED Notes (Signed)
Lt flank pain , for 2 days. Alert, talking. No dysuria,  Has had diarrhea, No N/V

## 2013-04-08 NOTE — Telephone Encounter (Signed)
Pls let pt know that she needs to have the pharmacy she has been getting this from send in a refill request since we have no current prescription on file.If she has not been on it fopr months, she needs an office visit before any prescription is to be sent in  I did try to call back and could not speak with her

## 2013-04-08 NOTE — Telephone Encounter (Signed)
Is she supposed to be on this?

## 2013-04-08 NOTE — Telephone Encounter (Signed)
Furosemide is on her current list 20mg  once she has been getting regularly from the pharmacy refill x 3 please , if not regularly refill x 1 only

## 2013-04-09 MED ORDER — PHENAZOPYRIDINE HCL 100 MG PO TABS
200.0000 mg | ORAL_TABLET | Freq: Once | ORAL | Status: AC
  Administered 2013-04-09: 200 mg via ORAL
  Filled 2013-04-09: qty 2

## 2013-04-09 MED ORDER — PHENAZOPYRIDINE HCL 200 MG PO TABS: 200.0000 mg | ORAL_TABLET | Freq: Three times a day (TID) | ORAL | Status: DC

## 2013-04-09 MED ORDER — IBUPROFEN 800 MG PO TABS
800.0000 mg | ORAL_TABLET | Freq: Once | ORAL | Status: AC
  Administered 2013-04-09: 800 mg via ORAL
  Filled 2013-04-09: qty 1

## 2013-04-09 MED ORDER — NITROFURANTOIN MACROCRYSTAL 100 MG PO CAPS
100.0000 mg | ORAL_CAPSULE | Freq: Once | ORAL | Status: AC
  Administered 2013-04-09: 100 mg via ORAL
  Filled 2013-04-09: qty 1

## 2013-04-09 MED ORDER — IBUPROFEN 800 MG PO TABS: 800.0000 mg | ORAL_TABLET | Freq: Three times a day (TID) | ORAL | Status: DC

## 2013-04-09 MED ORDER — NITROFURANTOIN MACROCRYSTAL 100 MG PO CAPS: 100.0000 mg | ORAL_CAPSULE | Freq: Two times a day (BID) | ORAL | Status: DC

## 2013-04-09 NOTE — ED Notes (Signed)
Discharge instructions given and reviewed with patient.  Prescriptions given for Ibuprofen, Pyridium and Macrodantin; effects and use explained for each.  Patient verbalized understanding to complete all antibiotic and take medications as directed.  Patient ambulatory; discharged home in good condition.

## 2013-04-09 NOTE — ED Provider Notes (Signed)
History     CSN: [id number]  Arrival date & time 04/08/13  2250   First MD Initiated Contact with Patient 04/09/13 0018      Chief Complaint  Patient presents with  . Flank Pain    (Consider location/radiation/quality/duration/timing/severity/associated sxs/prior treatment) HPI Melissa [name] is a 67 y.o. female who presents to the Emergency Department complaining of left flank pain and urinary frequency. Flank pain began x 2 days. She has been taking a fluid pill and has been going to the bathroom more frequently. She denies fever, chills, nausea, vomiting. She did have diarrhea yesterday.   PCP Dr. Lodema Hong  Past Medical History  Diagnosis Date  . Vertigo, intermittent   . Hypothyroidism   . Hyperlipidemia   . Obesity   . Hypertension     severe and resistant to treatment; 2008-negative left renal angiogram  . Vitiligo   . Degenerative disc disease, lumbar   . Degenerative disc disease, cervical   . Chest pain 2008    2008-normal coronary angiography  . Renal cell carcinoma     Right nephrectomy    Past Surgical History  Procedure Laterality Date  . Tubal ligation  1983    Bilateral  . Nephrectomy      Right; secondary to renal cell carcinoma    Family History  Problem Relation Age of Onset  . Heart disease Mother 59  . Hypertension Mother   . Colon cancer Father   . Hypertension Sister     x2  . Diabetes Sister     x2  . Heart failure Sister   . Lupus Brother     History  Substance Use Topics  . Smoking status: Never Smoker   . Smokeless tobacco: Never Used  . Alcohol Use: No    OB History   Grav Para Term Preterm Abortions TAB SAB Ect Mult Living                  Review of Systems  Constitutional: Negative for fever.       10 Systems reviewed and are negative for acute change except as noted in the HPI.  HENT: Negative for congestion.   Eyes: Negative for discharge and redness.  Respiratory: Negative for cough and shortness of breath.    Cardiovascular: Negative for chest pain.  Gastrointestinal: Negative for vomiting and abdominal pain.  Genitourinary: Positive for frequency and flank pain.  Musculoskeletal: Negative for back pain.  Skin: Negative for rash.  Neurological: Negative for syncope, numbness and headaches.  Psychiatric/Behavioral:       No behavior change.    Allergies  Morphine  Home Medications   Current Outpatient Rx  Name  Route  Sig  Dispense  Refill  . AMLODIPINE BESYLATE PO   Oral   Take 10 mg by mouth daily. One tab by mouth once daily         . aspirin (ASPIRIN LOW DOSE) 81 MG EC tablet   Oral   Take 81 mg by mouth daily. One tab by mouth once daily          . atenolol (TENORMIN) 25 MG tablet   Oral   Take 25 mg by mouth daily.         . Azilsartan Medoxomil 40 MG TABS   Oral   Take 1 tablet by mouth every morning.         . calcium-vitamin D (OSCAL 500/200 D-3) 500 MG tablet   Oral   Take 1  tablet by mouth 3 (three) times daily. Take 1 tablet by mouth three times a day          . FLUoxetine (PROZAC) 10 MG tablet      TAKE ONE TABLET BY MOUTH EVERY DAY   30 tablet   5   . furosemide (LASIX) 20 MG tablet   Oral   Take 20 mg by mouth. TAKE TWO TABLETS IN THE MORNING AND TWO TABLETS IN THE EVENING         . hydrALAZINE (APRESOLINE) 25 MG tablet   Oral   Take 50 mg by mouth 3 (three) times daily.         . hydrALAZINE (APRESOLINE) 25 MG tablet   Oral   Take 25 mg by mouth 3 (three) times daily.         Marland Kitchen levothyroxine (SYNTHROID, LEVOTHROID) 100 MCG tablet   Oral   Take 100 mcg by mouth. TAKE ONE TABLET  BY MOUTH THREE TIMES A WEEK         . levothyroxine (SYNTHROID, LEVOTHROID) 50 MCG tablet   Oral   Take 50 mcg by mouth. TAKE ONE TABLET BY MOUTH FOUR TIMES A WEEK         . lovastatin (MEVACOR) 40 MG tablet      TAKE ONE TABLET BY MOUTH AT BEDTIME   30 tablet   3   . Multiple Vitamins-Minerals (ONE-A-DAY EXTRAS ANTIOXIDANT PO)   Oral   Take  by mouth. Take one tablet by mouth          . potassium chloride SA (K-DUR,KLOR-CON) 20 MEQ tablet   Oral   Take 20 mEq by mouth 2 (two) times daily.           BP 203/71  Pulse 69  Temp(Src) 98.7 F (37.1 C) (Oral)  Resp 14  Ht 5\' 5"  (1.651 m)  Wt 240 lb (108.863 kg)  BMI 39.94 kg/m2  SpO2 100%  Physical Exam  Nursing note and vitals reviewed. Constitutional: She appears well-developed and well-nourished.  Awake, alert, nontoxic appearance.  HENT:  Head: Atraumatic.  Eyes: EOM are normal. Pupils are equal, round, and reactive to light.  Neck: Neck supple.  Cardiovascular: Normal rate and intact distal pulses.   Pulmonary/Chest: Effort normal and breath sounds normal. She exhibits no tenderness.  Abdominal: Soft. Bowel sounds are normal. There is no tenderness. There is no rebound.  Genitourinary:  No cva tenderness  Musculoskeletal: She exhibits no tenderness.  Baseline ROM, no obvious new focal weakness. Muscular tenderness to palpation on the left side.  Neurological:  Mental status and motor strength appears baseline for patient and situation.  Skin: No rash noted.  Psychiatric: She has a normal mood and affect.    ED Course  Procedures (including critical care time) Results for orders placed during the hospital encounter of 04/08/13  URINALYSIS, ROUTINE W REFLEX MICROSCOPIC      Result Value Range   Color, Urine YELLOW  YELLOW   APPearance HAZY (*) CLEAR   Specific Gravity, Urine >1.030 (*) 1.005 - 1.030   pH 5.5  5.0 - 8.0   Glucose, UA NEGATIVE  NEGATIVE mg/dL   Hgb urine dipstick NEGATIVE  NEGATIVE   Bilirubin Urine NEGATIVE  NEGATIVE   Ketones, ur TRACE (*) NEGATIVE mg/dL   Protein, ur TRACE (*) NEGATIVE mg/dL   Urobilinogen, UA 0.2  0.0 - 1.0 mg/dL   Nitrite NEGATIVE  NEGATIVE   Leukocytes, UA NEGATIVE  NEGATIVE  URINE  MICROSCOPIC-ADD ON      Result Value Range   Squamous Epithelial / LPF MANY (*) RARE   WBC, UA 3-6  <3 WBC/hpf   Bacteria, UA  FEW (*) RARE   Casts HYALINE CASTS (*) NEGATIVE       MDM  Patient with left sided flank pain and urinary frequency. UA with 3-6 wbc. Will treat for UTI. Given antibiotic and pyridium. Given ibuprofen. Pt stable in ED with no significant deterioration in condition.The patient appears reasonably screened and/or stabilized for discharge and I doubt any other medical condition or other [facility] requiring further screening, evaluation, or treatment in the ED at this time prior to discharge.  MDM Reviewed: nursing note and vitals Interpretation: labs           Nicoletta Dress. Colon Branch, MD 04/09/13 [phone]

## 2013-04-10 LAB — URINE CULTURE: Colony Count: 65000

## 2013-04-14 NOTE — Telephone Encounter (Signed)
Called patient and left message for them to return call at the office   

## 2013-04-14 NOTE — Telephone Encounter (Signed)
Will address with patient when she calls back

## 2013-04-21 ENCOUNTER — Other Ambulatory Visit (HOSPITAL_COMMUNITY): Payer: Self-pay

## 2013-04-21 DIAGNOSIS — J8410 Pulmonary fibrosis, unspecified: Secondary | ICD-10-CM

## 2013-04-23 ENCOUNTER — Other Ambulatory Visit: Payer: Self-pay | Admitting: Cardiology

## 2013-04-27 ENCOUNTER — Encounter (HOSPITAL_COMMUNITY): Payer: Medicare Other

## 2013-05-04 ENCOUNTER — Ambulatory Visit (HOSPITAL_COMMUNITY)
Admission: RE | Admit: 2013-05-04 | Discharge: 2013-05-04 | Disposition: A | Discharge status: Home / Self Care | Payer: Medicare Other | Source: Ambulatory Visit | Attending: Pulmonary Disease | Admitting: Pulmonary Disease

## 2013-05-04 DIAGNOSIS — R0609 Other forms of dyspnea: Secondary | ICD-10-CM | POA: Insufficient documentation

## 2013-05-04 DIAGNOSIS — J8410 Pulmonary fibrosis, unspecified: Secondary | ICD-10-CM | POA: Insufficient documentation

## 2013-05-04 DIAGNOSIS — R0989 Other specified symptoms and signs involving the circulatory and respiratory systems: Secondary | ICD-10-CM | POA: Insufficient documentation

## 2013-05-04 LAB — BLOOD GAS, ARTERIAL
O2 Saturation: 97.7 %
pCO2 arterial: 38.9 mmHg (ref 35.0–45.0)
pH, Arterial: 7.458 — ABNORMAL HIGH (ref 7.350–7.450)

## 2013-05-04 MED ORDER — ALBUTEROL SULFATE (5 MG/ML) 0.5% IN NEBU
2.5000 mg | INHALATION_SOLUTION | Freq: Once | RESPIRATORY_TRACT | Status: AC
  Administered 2013-05-04: 2.5 mg via RESPIRATORY_TRACT

## 2013-05-05 NOTE — Procedures (Signed)
Melissa [name], Melissa [name]             ACCOUNT NO.:  [id number]  MEDICAL RECORD NO.:  [id number]  LOCATION:                                 FACILITY:  PHYSICIAN:  Rexine Gowens L. 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 Spirometry shows a moderate ventilatory defect without significant     airflow obstruction. 2. Lung volumes show reduction of total lung capacity of approximately     the same order of magnitude as the ventilatory defect. 3. DLCO is severely reduced, but improves somewhat when ventilation is     taken into account. 4. Airway resistance is slightly high. 5. There is no significant bronchodilator improvement. 6. Arterial blood gas is normal. 7. 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     [name], M.D.     ELH/MEDQ  D:  05/04/2013  T:  05/05/2013  Job:  [id number]

## 2013-05-06 ENCOUNTER — Other Ambulatory Visit: Payer: Self-pay | Admitting: Cardiology

## 2013-05-06 ENCOUNTER — Other Ambulatory Visit (HOSPITAL_COMMUNITY): Payer: Self-pay | Admitting: Pulmonary Disease

## 2013-05-06 DIAGNOSIS — J8410 Pulmonary fibrosis, unspecified: Secondary | ICD-10-CM

## 2013-05-07 NOTE — Telephone Encounter (Signed)
pls refill thyroid med x 2

## 2013-05-11 ENCOUNTER — Ambulatory Visit (HOSPITAL_COMMUNITY)
Admission: RE | Admit: 2013-05-11 | Discharge: 2013-05-11 | Disposition: A | Discharge status: Home / Self Care | Payer: Medicare Other | Source: Ambulatory Visit | Attending: Pulmonary Disease | Admitting: Pulmonary Disease

## 2013-05-11 DIAGNOSIS — Z85528 Personal history of other malignant neoplasm of kidney: Secondary | ICD-10-CM | POA: Insufficient documentation

## 2013-05-11 DIAGNOSIS — J8410 Pulmonary fibrosis, unspecified: Secondary | ICD-10-CM | POA: Insufficient documentation

## 2013-05-11 DIAGNOSIS — R0602 Shortness of breath: Secondary | ICD-10-CM | POA: Insufficient documentation

## 2013-05-11 DIAGNOSIS — R918 Other nonspecific abnormal finding of lung field: Secondary | ICD-10-CM | POA: Insufficient documentation

## 2013-05-11 DIAGNOSIS — I10 Essential (primary) hypertension: Secondary | ICD-10-CM | POA: Insufficient documentation

## 2013-05-13 LAB — PULMONARY FUNCTION TEST

## 2013-06-07 ENCOUNTER — Telehealth: Payer: Self-pay | Admitting: Family Medicine

## 2013-06-07 DIAGNOSIS — I10 Essential (primary) hypertension: Secondary | ICD-10-CM

## 2013-06-07 DIAGNOSIS — E785 Hyperlipidemia, unspecified: Secondary | ICD-10-CM

## 2013-06-07 DIAGNOSIS — E039 Hypothyroidism, unspecified: Secondary | ICD-10-CM

## 2013-06-07 DIAGNOSIS — D649 Anemia, unspecified: Secondary | ICD-10-CM

## 2013-06-07 NOTE — Telephone Encounter (Signed)
No HBa1c

## 2013-06-07 NOTE — Telephone Encounter (Signed)
Does patient need anything ordered?

## 2013-06-07 NOTE — Telephone Encounter (Signed)
Fasting lipid, cmp h/h, TSH, will let you know in note to follow re HBA1C

## 2013-06-08 NOTE — Telephone Encounter (Signed)
Labs ordered and faxed to [facility].  Attempted to reach pt to notify.  Home number disconnected. Cell number no availability to leave msg.

## 2013-06-09 ENCOUNTER — Other Ambulatory Visit (HOSPITAL_COMMUNITY)
Admission: RE | Admit: 2013-06-09 | Discharge: 2013-06-09 | Disposition: A | Discharge status: Home / Self Care | Payer: Medicare Other | Source: Ambulatory Visit | Attending: Family Medicine | Admitting: Family Medicine

## 2013-06-09 ENCOUNTER — Encounter: Payer: Medicare Other | Admitting: Family Medicine

## 2013-06-09 ENCOUNTER — Ambulatory Visit (INDEPENDENT_AMBULATORY_CARE_PROVIDER_SITE_OTHER): Payer: Medicare Other | Admitting: Family Medicine

## 2013-06-09 ENCOUNTER — Encounter: Payer: Self-pay | Admitting: Family Medicine

## 2013-06-09 VITALS — BP 130/68 | HR 70 | Resp 18 | Ht 65.0 in | Wt 240.1 lb

## 2013-06-09 DIAGNOSIS — Z1151 Encounter for screening for human papillomavirus (HPV): Secondary | ICD-10-CM | POA: Insufficient documentation

## 2013-06-09 DIAGNOSIS — Z0000 Encounter for general adult medical examination without abnormal findings: Secondary | ICD-10-CM

## 2013-06-09 DIAGNOSIS — I10 Essential (primary) hypertension: Secondary | ICD-10-CM

## 2013-06-09 DIAGNOSIS — Z1211 Encounter for screening for malignant neoplasm of colon: Secondary | ICD-10-CM

## 2013-06-09 DIAGNOSIS — Z131 Encounter for screening for diabetes mellitus: Secondary | ICD-10-CM

## 2013-06-09 DIAGNOSIS — Z13220 Encounter for screening for lipoid disorders: Secondary | ICD-10-CM

## 2013-06-09 DIAGNOSIS — J8410 Pulmonary fibrosis, unspecified: Secondary | ICD-10-CM

## 2013-06-09 DIAGNOSIS — D649 Anemia, unspecified: Secondary | ICD-10-CM

## 2013-06-09 DIAGNOSIS — E039 Hypothyroidism, unspecified: Secondary | ICD-10-CM

## 2013-06-09 DIAGNOSIS — Z01419 Encounter for gynecological examination (general) (routine) without abnormal findings: Secondary | ICD-10-CM | POA: Insufficient documentation

## 2013-06-09 LAB — POC HEMOCCULT BLD/STL (OFFICE/1-CARD/DIAGNOSTIC)

## 2013-06-09 MED ORDER — LEVOTHYROXINE SODIUM 100 MCG PO TABS: ORAL_TABLET | ORAL | Status: DC

## 2013-06-09 NOTE — Patient Instructions (Addendum)
F/u end October or early November  Please call if you need me before   CBC  , fasting lipid , cmp , TSH iron and ferritin July 3 or after  It is important that you exercise regularly at least 30 minutes 5 times a week. If you develop chest pain, have severe difficulty breathing, or feel very tired, stop exercising immediately and seek medical attention   A healthy diet is rich in fruit, vegetables and whole grains. Poultry fish, nuts and beans are a healthy choice for protein rather then red meat. A low sodium diet and drinking 64 ounces of water daily is generally recommended. Oils and sweet should be limited. Carbohydrates especially for those who are diabetic or overweight, should be limited to 30 to 45 gram per meal. It is important to eat on a regular schedule, at least 3 times daily. Snacks should be primarily fruits, vegetables or nuts.   HBA1C and chem 7 end October  Mammogram is due next month, call for your appt , we will give you the number

## 2013-06-11 NOTE — Assessment & Plan Note (Signed)
Pelvic and breast exam and rectal as documented Re educated re need for low carb , low fat diet and commitment to regular exercise to facilitate weight loss

## 2013-06-11 NOTE — Progress Notes (Signed)
Subjective:    Patient ID: Melissa [name], female    DOB: 30-Jan-1946, 67 y.o.   MRN: [id number]  HPI The PT is here for pelvic and breast exam and re-evaluation of chronic medical conditions, medication management and review of any available recent lab and radiology data.  Preventive health is updated, specifically  Cancer screening and Immunization.   Questions or concerns regarding consultations or procedures which the PT has had in the interim are  Addressed.Has recently been evaluated by pulmonary for fibrosis, and also by cardiology The PT denies any adverse reactions to current medications since the last visit.  There are no new concerns.  There are no specific complaints       Review of Systems See HPI Denies recent fever or chills. Denies sinus pressure, nasal congestion, ear pain or sore throat. Denies chest congestion, productive cough or wheezing. Denies chest pains, palpitations and leg swelling Denies abdominal pain, nausea, vomiting,diarrhea or constipation.   Denies dysuria, frequency, hesitancy or incontinence. Denies joint pain, swelling and limitation in mobility. Denies headaches, seizures, numbness, or tingling. Denies depression, anxiety or insomnia. Denies skin break down or rash.        Objective:   Physical Exam Pleasant well nourished female, alert and oriented x 3, in no cardio-pulmonary distress. Afebrile. HEENT No facial trauma or asymetry. Sinuses non tender.  EOMI, PERTL, fundoscopic exam is normal, no hemorhage or exudate.  External ears normal, tympanic membranes clear. Oropharynx moist, no exudate, good dentition. Neck: supple, no adenopathy,JVD or thyromegaly.No bruits.  Chest: Clear to ascultation bilaterally.Nobibasilar crackles, no wheezes Non tender to palpation  Breast: No asymetry,no masses. No nipple discharge or inversion. No axillary or supraclavicular adenopathy  Cardiovascular system; Heart sounds normal,  S1 and  S2  ,no S3.  No murmur, or thrill. Apical beat not displaced Peripheral pulses normal.  Abdomen: Soft, non tender, no organomegaly or masses. No bruits. Bowel sounds normal. No guarding, tenderness or rebound.  Rectal:  No mass. Guaiac negative stool.  GU: External genitalia normal. No lesions. Vaginal canal normal.No discharge. Uterus normal size, no adnexal masses, no cervical motion or adnexal tenderness.  Musculoskeletal exam: Full ROM of spine, hips , shoulders and knees. No deformity ,swelling or crepitus noted. No muscle wasting or atrophy.   Neurologic: Cranial nerves 2 to 12 intact. Power, tone ,sensation and reflexes normal throughout. No disturbance in gait. No tremor.  Skin: Intact, no ulceration, erythema , scaling or rash noted. Pigmentation normal throughout  Psych; Normal mood and affect. Judgement and concentration normal        Assessment & Plan:

## 2013-06-30 ENCOUNTER — Ambulatory Visit: Payer: Medicare Other | Admitting: Adult Health

## 2013-07-21 LAB — COMPREHENSIVE METABOLIC PANEL
AST: 18 U/L (ref 0–37)
BUN: 27 mg/dL — ABNORMAL HIGH (ref 6–23)
Calcium: 9.6 mg/dL (ref 8.4–10.5)
Chloride: 103 mEq/L (ref 96–112)
Creat: 1.74 mg/dL — ABNORMAL HIGH (ref 0.50–1.10)
Glucose, Bld: 82 mg/dL (ref 70–99)

## 2013-07-21 LAB — CBC
Hemoglobin: 10.8 g/dL — ABNORMAL LOW (ref 12.0–15.0)
MCHC: 35.6 g/dL (ref 30.0–36.0)
RBC: 3.55 MIL/uL — ABNORMAL LOW (ref 3.87–5.11)

## 2013-07-21 LAB — LIPID PANEL
Cholesterol: 131 mg/dL (ref 0–200)
Triglycerides: 193 mg/dL — ABNORMAL HIGH (ref <150)
VLDL: 39 mg/dL (ref 0–40)

## 2013-07-21 LAB — IRON: Iron: 80 ug/dL (ref 42–145)

## 2013-07-21 LAB — FERRITIN: Ferritin: 97 ng/mL (ref 10–291)

## 2013-07-21 LAB — TSH: TSH: 0.584 u[IU]/mL (ref 0.350–4.500)

## 2013-07-22 ENCOUNTER — Encounter: Payer: Self-pay | Admitting: Adult Health

## 2013-07-22 ENCOUNTER — Ambulatory Visit (INDEPENDENT_AMBULATORY_CARE_PROVIDER_SITE_OTHER): Payer: Medicare Other | Admitting: Adult Health

## 2013-07-22 VITALS — BP 124/78 | HR 63 | Ht 65.5 in | Wt 245.8 lb

## 2013-07-22 DIAGNOSIS — R079 Chest pain, unspecified: Secondary | ICD-10-CM

## 2013-07-22 DIAGNOSIS — J8410 Pulmonary fibrosis, unspecified: Secondary | ICD-10-CM

## 2013-07-22 DIAGNOSIS — E669 Obesity, unspecified: Secondary | ICD-10-CM

## 2013-07-22 DIAGNOSIS — I10 Essential (primary) hypertension: Secondary | ICD-10-CM

## 2013-07-22 NOTE — Progress Notes (Deleted)
Name: Melissa [name]    DOB: Oct 30, 1946  Age: 67 y.o.  MR#: [id number]       PCP:  [name], MD      Insurance: Payor: Cleatrice Burke MEDICARE / Plan: AARP MEDICARE COMPLETE / Product Type: *No Product type* /   CC:    Chief Complaint  Patient presents with  . Hypertension    VS Filed Vitals:   07/22/13 1350  BP: 124/78  Pulse: 63  Height: 5' 5.5" (1.664 m)  Weight: 245 lb 12.8 oz (111.494 kg)    Weights Current Weight  07/22/13 245 lb 12.8 oz (111.494 kg)  06/09/13 240 lb 1.9 oz (108.918 kg)  04/08/13 240 lb (108.863 kg)    Blood Pressure  BP Readings from Last 3 Encounters:  07/22/13 124/78  06/09/13 130/68  04/09/13 185/75     Admit date:  (Not on file) Last encounter with RMR:  06/30/2013   Allergy Morphine  Current Outpatient Prescriptions  Medication Sig Dispense Refill  . AMLODIPINE BESYLATE PO Take 10 mg by mouth daily. One tab by mouth once daily      . aspirin (ASPIRIN LOW DOSE) 81 MG EC tablet Take 81 mg by mouth daily. One tab by mouth once daily       . atenolol (TENORMIN) 25 MG tablet Take 25 mg by mouth daily.      . Azilsartan Medoxomil 40 MG TABS Take 1 tablet by mouth every morning.      . Calcium Carbonate-Vit D-Min (CALCIUM 1200 PO) Take 2 tablets by mouth daily.      . Cholecalciferol (VITAMIN D-3) 1000 UNITS CAPS Take 2 capsules by mouth daily.      Marland Kitchen FLUoxetine (PROZAC) 10 MG tablet TAKE ONE TABLET BY MOUTH EVERY DAY  30 tablet  5  . furosemide (LASIX) 40 MG tablet Take 40 mg by mouth 2 (two) times daily.      . hydrALAZINE (APRESOLINE) 25 MG tablet Take 25 mg by mouth 3 (three) times daily.       . hydrALAZINE (APRESOLINE) 50 MG tablet Take 50 mg by mouth 3 (three) times daily.      Marland Kitchen levothyroxine (SYNTHROID, LEVOTHROID) 100 MCG tablet TAKE ONE TABLET BY MOUTH THREE TIMES A WEEK ON MONDAY, WEDNESDAY AND FRIDAY  30 tablet  4  . levothyroxine (SYNTHROID, LEVOTHROID) 50 MCG tablet TAKE ONE TABLET BY MOUTH 4 TIMES A WEEK TAKE  ON   TUESDAY,  THURSDAY,  SATURDAY  AND  SUNDAY  30 tablet  2  . lovastatin (MEVACOR) 40 MG tablet TAKE ONE TABLET BY MOUTH AT BEDTIME  30 tablet  3  . Multiple Vitamins-Minerals (ONE-A-DAY EXTRAS ANTIOXIDANT PO) Take by mouth. Take one tablet by mouth       . potassium chloride SA (K-DUR,KLOR-CON) 20 MEQ tablet Take 20 mEq by mouth 2 (two) times daily.      Marland Kitchen spironolactone (ALDACTONE) 25 MG tablet Take 25 mg by mouth daily.       No current facility-administered medications for this visit.    Discontinued Meds:    Medications Discontinued During This Encounter  Medication Reason  . calcium-vitamin D (OSCAL 500/200 D-3) 500 MG tablet Error  . furosemide (LASIX) 20 MG tablet Error    Patient Active Problem List   Diagnosis Date Noted  . Pulmonary fibrosis 03/29/2013  . Routine general medical examination at a health care facility 01/23/2013  . Anemia 08/13/2012  . Dyspnea on exertion 04/14/2012  . Hyperlipidemia   .  Hypertension   . Renal cell carcinoma   . Chest pain   . Vitiligo 04/01/2011  . HEARING LOSS 07/18/2010  . Depression 05/18/2010  . VITAMIN D DEFICIENCY 10/24/2009  . Hypothyroid 03/31/2008  . Obesity 03/31/2008  . VERTIGO, INTERMITTENT 03/31/2008    LABS    Component Value Date/Time   NA 133* 07/21/2013 1115   NA 140 04/02/2013 1225   NA 139 02/08/2013 1515   K 4.8 07/21/2013 1115   K 3.8 04/02/2013 1225   K 3.5 02/08/2013 1515   CL 103 07/21/2013 1115   CL 105 04/02/2013 1225   CL 104 02/08/2013 1515   CO2 23 07/21/2013 1115   CO2 29 04/02/2013 1225   CO2 28 02/08/2013 1515   GLUCOSE 82 07/21/2013 1115   GLUCOSE 73 04/02/2013 1225   GLUCOSE 97 02/08/2013 1515   BUN 27* 07/21/2013 1115   BUN 13 04/02/2013 1225   BUN 12 02/08/2013 1515   CREATININE 1.74* 07/21/2013 1115   CREATININE 1.13* 04/02/2013 1225   CREATININE 1.13* 02/08/2013 1515   CREATININE 1.09 11/20/2010 0109   CREATININE 0.93 05/08/2010 0703   CREATININE 1.14 04/04/2010 2023   CALCIUM 9.6 07/21/2013 1115    CALCIUM 9.6 04/02/2013 1225   CALCIUM 9.7 02/08/2013 1515   GFRNONAA >60 05/08/2010 0703   GFRNONAA 36* 12/16/2007 0420   GFRNONAA 36* 12/15/2007 0440   GFRAA  Value: >60        The eGFR has been calculated using the MDRD equation. This calculation has not been validated in all clinical situations. eGFR's persistently <60 mL/min signify possible Chronic Kidney Disease. 05/08/2010 0703   GFRAA  Value: 44        The eGFR has been calculated using the MDRD equation. This calculation has not been validated in all clinical* 12/16/2007 0420   GFRAA  Value: 43        The eGFR has been calculated using the MDRD equation. This calculation has not been validated in all clinical* 12/15/2007 0440   CMP     Component Value Date/Time   NA 133* 07/21/2013 1115   K 4.8 07/21/2013 1115   CL 103 07/21/2013 1115   CO2 23 07/21/2013 1115   GLUCOSE 82 07/21/2013 1115   BUN 27* 07/21/2013 1115   CREATININE 1.74* 07/21/2013 1115   CREATININE 1.09 11/20/2010 0109   CALCIUM 9.6 07/21/2013 1115   PROT 8.3 07/21/2013 1115   ALBUMIN 3.9 07/21/2013 1115   AST 18 07/21/2013 1115   ALT 15 07/21/2013 1115   ALKPHOS 34* 07/21/2013 1115   BILITOT 0.5 07/21/2013 1115   GFRNONAA >60 05/08/2010 0703   GFRAA  Value: >60        The eGFR has been calculated using the MDRD equation. This calculation has not been validated in all clinical situations. eGFR's persistently <60 mL/min signify possible Chronic Kidney Disease. 05/08/2010 0703       Component Value Date/Time   WBC 5.6 07/21/2013 1115   WBC 6.7 01/14/2013 1025   WBC 6.0 08/06/2012 1103   HGB 10.8* 07/21/2013 1115   HGB 11.4* 01/14/2013 1025   HGB 10.9* 08/06/2012 1103   HCT 30.3* 07/21/2013 1115   HCT 32.5* 01/14/2013 1025   HCT 31.7* 08/06/2012 1103   MCV 85.4 07/21/2013 1115   MCV 86.7 01/14/2013 1025   MCV 88.1 08/06/2012 1103    Lipid Panel     Component Value Date/Time   CHOL 131 07/21/2013 1115   TRIG 193* 07/21/2013 1115  HDL 31* 07/21/2013 1115   CHOLHDL 4.2 07/21/2013 1115    VLDL 39 07/21/2013 1115   LDLCALC 61 07/21/2013 1115    ABG    Component Value Date/Time   PHART 7.458* 05/04/2013 1025   PCO2ART 38.9 05/04/2013 1025   PO2ART 89.8 05/04/2013 1025   HCO3 27.1* 05/04/2013 1025   TCO2 24.4 05/04/2013 1025   O2SAT 97.7 05/04/2013 1025     Lab Results  Component Value Date   TSH 0.584 07/21/2013   BNP (last 3 results) No results found for this basename: PROBNP,  in the last 8760 hours Cardiac Panel (last 3 results) No results found for this basename: CKTOTAL, CKMB, TROPONINI, RELINDX,  in the last 72 hours  Iron/TIBC/Ferritin    Component Value Date/Time   IRON 80 07/21/2013 1115   TIBC 302 07/31/2011 1245   FERRITIN 97 07/21/2013 1115     EKG Orders placed in visit on 01/01/13  . EKG 12-LEAD     Prior Assessment and Plan Problem List as of 07/22/2013     Cardiovascular and Mediastinum   Hypertension   Last Assessment & Plan   03/29/2013 Office Visit Written 04/04/2013 10:09 PM by Kerri Perches, MD     Sub optimal control wit c/o fatigue , palpitations and inerermittent chest discomfort, will refer to cardiology for further eva; She is enrolled in a hypertension study through [facility] and was recently advised by the mid level practioner that these symptoms need to be addressed      Respiratory   Pulmonary fibrosis   Last Assessment & Plan   03/29/2013 Office Visit Written 04/04/2013 10:10 PM by Kerri Perches, MD     Cough, abnorma;l CXr refer for pulmonary eval      Endocrine   Hypothyroid   Last Assessment & Plan   03/29/2013 Office Visit Written 04/04/2013 10:10 PM by Kerri Perches, MD     Controlled, no change in medication       Nervous and Auditory   HEARING LOSS     Musculoskeletal and Integument   Vitiligo   Last Assessment & Plan   06/17/2011 Office Visit Written 06/23/2011  5:23 PM by [name], MD     Unchanged, no specific management, has seen dermatology in the past      Genitourinary   Renal cell  carcinoma     Other   VITAMIN D DEFICIENCY   Obesity   Last Assessment & Plan   08/13/2012 Office Visit Written 08/13/2012 12:26 PM by Salley Scarlet, MD     Pt to see nutritionist, also to increase activity, she is to restart at [facility]    Depression   Last Assessment & Plan   06/17/2011 Office Visit Written 06/23/2011  5:22 PM by [name], MD     Improved and controlled on current medication, no change    VERTIGO, INTERMITTENT   Hyperlipidemia   Last Assessment & Plan   03/29/2013 Office Visit Written 04/04/2013 10:10 PM by Kerri Perches, MD     Hyperlipidemia:Low fat diet discussed and encouraged.  Controlled, no change in medication     Chest pain   Dyspnea on exertion   Last Assessment & Plan   04/02/2013 Office Visit Written 04/02/2013 12:11 PM by Jodelle Gross, NP     Chest x-ray revealed mild interstitial lung disease. She is being referred to Dr. Juanetta Gosling for further evaluation and recommendations. She also has morbid obesity which may be playing a role  in her breathing status.    Anemia   Last Assessment & Plan   08/13/2012 Office Visit Written 08/13/2012 12:31 PM by Salley Scarlet, MD     Hb stable, s/p colonoscopy 2007    Routine general medical examination at a health care facility   Last Assessment & Plan   06/09/2013 Office Visit Written 06/11/2013  8:01 AM by Kerri Perches, MD     Pelvic and breast exam and rectal as documented Re educated re need for low carb , low fat diet and commitment to regular exercise to facilitate weight loss        Imaging: No results found.

## 2013-07-22 NOTE — Assessment & Plan Note (Signed)
Melissa [name] recently placed on inhaler Symbicort for which she uses daily she states that this has done a remarkable difference in improvement of her breathing status. She is to continue her current medication regimen and followup with Dr. Abbe Amsterdam as directed.

## 2013-07-22 NOTE — Assessment & Plan Note (Signed)
With changes in her activity and increased exercise it is hopeful that she will begin to lose weight and feel better each day. I have encouraged her in this endeavor.

## 2013-07-22 NOTE — Assessment & Plan Note (Signed)
Excellent control of blood pressure. She is medically compliant, is tolerating Tenormin causing spinal lactone daily. No evidence of chest discomfort related to bronchospasms with the use of beta blocker. In fact she is feeling much better and is increasing her exercise to include bicycling and lifting. I have encouraged her to continue her healthy lifestyle habits, and hopefully she will lose weight which will assist with overall health status and blood pressure control.

## 2013-07-22 NOTE — Patient Instructions (Signed)

## 2013-07-22 NOTE — Progress Notes (Signed)
HPI: [name] is a morbidly obese 67 year old patient of Dr. Dietrich Pates we are following for ongoing assessment and management of hypertension and hypercholesterolemia. He was last seen in the office on April of 2014 with complaints of fatigue and bandlike pressure around her waist. She has had some dietary noncompliance with salt and had fluid retention with blood pressure elevation on last visit. She has had a subsequent PFT completed on 05/04/2013 secondary recurrent Bronchitis and shortness of breath. Diagnosis revealed moderate restriction of interstitial and moderately to severe diffusion defect. The defect was suggestive of an interstitial process such as fibrosis or interstitial inflammation. She is being followed by pulmonary, Dr. Juanetta Gosling.   She comes today feeling much better. She has had no further complaints of chest discomfort her breathing status is better after being placed on Symbicort by Dr. Juanetta Gosling. In fact she is increasing her exercise riding a bicycle and lifting weights daily. She is hoping to lose weight now that she is feeling better and is able to breathe better. She is to followup with Dr. Juanetta Gosling again in October. She is doing well and is medically compliant.      Allergies  Allergen Reactions  . Morphine     Current Outpatient Prescriptions  Medication Sig Dispense Refill  . AMLODIPINE BESYLATE PO Take 10 mg by mouth daily. One tab by mouth once daily      . aspirin (ASPIRIN LOW DOSE) 81 MG EC tablet Take 81 mg by mouth daily. One tab by mouth once daily       . atenolol (TENORMIN) 25 MG tablet Take 25 mg by mouth daily.      . Azilsartan Medoxomil 40 MG TABS Take 1 tablet by mouth every morning.      . Calcium Carbonate-Vit D-Min (CALCIUM 1200 PO) Take 2 tablets by mouth daily.      . Cholecalciferol (VITAMIN D-3) 1000 UNITS CAPS Take 2 capsules by mouth daily.      Marland Kitchen FLUoxetine (PROZAC) 10 MG tablet TAKE ONE TABLET BY MOUTH EVERY DAY  30 tablet  5  . furosemide  (LASIX) 40 MG tablet Take 40 mg by mouth 2 (two) times daily.      . hydrALAZINE (APRESOLINE) 25 MG tablet Take 25 mg by mouth 3 (three) times daily.       . hydrALAZINE (APRESOLINE) 50 MG tablet Take 50 mg by mouth 3 (three) times daily.      Marland Kitchen levothyroxine (SYNTHROID, LEVOTHROID) 100 MCG tablet TAKE ONE TABLET BY MOUTH THREE TIMES A WEEK ON MONDAY, WEDNESDAY AND FRIDAY  30 tablet  4  . levothyroxine (SYNTHROID, LEVOTHROID) 50 MCG tablet TAKE ONE TABLET BY MOUTH 4 TIMES A WEEK TAKE  ON  TUESDAY,  THURSDAY,  SATURDAY  AND  SUNDAY  30 tablet  2  . lovastatin (MEVACOR) 40 MG tablet TAKE ONE TABLET BY MOUTH AT BEDTIME  30 tablet  3  . Multiple Vitamins-Minerals (ONE-A-DAY EXTRAS ANTIOXIDANT PO) Take by mouth. Take one tablet by mouth       . potassium chloride SA (K-DUR,KLOR-CON) 20 MEQ tablet Take 20 mEq by mouth 2 (two) times daily.      Marland Kitchen spironolactone (ALDACTONE) 25 MG tablet Take 25 mg by mouth daily.       No current facility-administered medications for this visit.    Past Medical History  Diagnosis Date  . Vertigo, intermittent   . Hypothyroidism   . Hyperlipidemia   . Obesity   . Hypertension  severe and resistant to treatment; 2008-negative left renal angiogram  . Vitiligo   . Degenerative disc disease, lumbar   . Degenerative disc disease, cervical   . Chest pain 2008    2008-normal coronary angiography  . Renal cell carcinoma     Right nephrectomy    Past Surgical History  Procedure Laterality Date  . Tubal ligation  1983    Bilateral  . Nephrectomy      Right; secondary to renal cell carcinoma    ZOX:WRUEAV of systems complete and found to be negative unless listed above  PHYSICAL EXAM BP 124/78  Pulse 63  Ht 5' 5.5" (1.664 m)  Wt 245 lb 12.8 oz (111.494 kg)  BMI 40.27 kg/m2  General: Well developed, well nourished, obese, in no acute distress Head: Eyes PERRLA, No xanthomas.   Normal cephalic and atramatic  Lungs: Clear bilaterally to auscultation , no  cough wheeze or rhonchi  Heart: HRRR S1 S2, without MRG.  Pulses are 2+ & equal.            No carotid bruit. No JVD.  No abdominal bruits. No femoral bruits. Abdomen: Bowel sounds are positive, abdomen soft and non-tender without masses or                  Hernia's noted. Msk:  Back normal, normal gait. Normal strength and tone for age. Extremities: No clubbing, cyanosis or edema.  DP +1 Neuro: Alert and oriented X 3. Psych:  Good affect, responds appropriately  EKG: Normal sinus rhythm rate of 63 beats per minute, nonspecific T-wave abnormality noted inferior laterally, with T-wave inversion noted in lead 3 and aVF  ASSESSMENT AND PLAN

## 2013-07-22 NOTE — Assessment & Plan Note (Signed)
She is been without planes of recurrent chest pain since eating seen by Dr. Juanetta Gosling and placed on steroid inhaler. We will continue current medication regimen without changes. She is on amlodipine and beta blocker to help with angina symptoms should these be the source of her pain. So far she has been pain-free.

## 2013-07-28 ENCOUNTER — Telehealth: Payer: Self-pay | Admitting: Family Medicine

## 2013-07-28 NOTE — Telephone Encounter (Addendum)
Pls let pt know recent lab shows normal thyroid function, so she is to continue taking meds at same dose She is mildly dehydrated, so needs to ensure she drinks   64 ounces water daily,  Her triglycerides are high, needs to reduce fried and fatty foods , cheese,

## 2013-07-30 NOTE — Telephone Encounter (Signed)
Patient aware of results.

## 2013-08-02 ENCOUNTER — Encounter: Payer: Self-pay | Admitting: Family Medicine

## 2013-08-02 ENCOUNTER — Ambulatory Visit (INDEPENDENT_AMBULATORY_CARE_PROVIDER_SITE_OTHER): Payer: Medicare Other | Admitting: Family Medicine

## 2013-08-02 VITALS — BP 136/68 | HR 70 | Resp 18 | Ht 65.0 in | Wt 247.0 lb

## 2013-08-02 DIAGNOSIS — L80 Vitiligo: Secondary | ICD-10-CM

## 2013-08-02 DIAGNOSIS — E785 Hyperlipidemia, unspecified: Secondary | ICD-10-CM

## 2013-08-02 DIAGNOSIS — I10 Essential (primary) hypertension: Secondary | ICD-10-CM

## 2013-08-02 DIAGNOSIS — J8410 Pulmonary fibrosis, unspecified: Secondary | ICD-10-CM

## 2013-08-02 DIAGNOSIS — R918 Other nonspecific abnormal finding of lung field: Secondary | ICD-10-CM

## 2013-08-02 DIAGNOSIS — R109 Unspecified abdominal pain: Secondary | ICD-10-CM

## 2013-08-02 DIAGNOSIS — E039 Hypothyroidism, unspecified: Secondary | ICD-10-CM

## 2013-08-02 LAB — POCT URINALYSIS DIPSTICK
Blood, UA: NEGATIVE
Protein, UA: NEGATIVE
Spec Grav, UA: 1.025
Urobilinogen, UA: 0.2
pH, UA: 6

## 2013-08-02 MED ORDER — CYCLOBENZAPRINE HCL 5 MG PO TABS: ORAL_TABLET | ORAL | Status: DC

## 2013-08-02 MED ORDER — ACETAMINOPHEN 500 MG PO TABS: ORAL_TABLET | ORAL | Status: AC

## 2013-08-02 NOTE — Assessment & Plan Note (Signed)
Improved on advair, will continue same, less dyspnea

## 2013-08-02 NOTE — Assessment & Plan Note (Signed)
Controlled, no change in medication DASH diet and commitment to daily physical activity for a minimum of 30 minutes discussed and encouraged, as a part of hypertension management. The importance of attaining a healthy weight is also discussed.  

## 2013-08-02 NOTE — Patient Instructions (Addendum)
F/u as before  Your right flank pain is due to spasm in muscles of back from sleeping on a couch  Tylenol and a muscle relaxant are prescribed.Take for 1 week only, then , as needed  You are referred for thyroid US.  Breathing is better, commit to daily exercise to improve your rxercise tolerance, evem 20 minutes total per day, will help a lot  Recent labs showed mild dehydration, please commit to at least 64 ounces of water daily. Make sure you drink adeqaute water before October lab even though it is fasting this does not matter, water is OK  Back Pain, Adult Low back pain is very common. About 1 in 5 people have back pain.The cause of low back pain is rarely dangerous. The pain often gets better over time.About half of people with a sudden onset of back pain feel better in just 2 weeks. About 8 in 10 people feel better by 6 weeks.  CAUSES Some common causes of back pain include:  Strain of the muscles or ligaments supporting the spine.  Wear and tear (degeneration) of the spinal discs.  Arthritis.  Direct injury to the back. DIAGNOSIS Most of the time, the direct cause of low back pain is not known.However, back pain can be treated effectively even when the exact cause of the pain is unknown.Answering your caregiver's questions about your overall health and symptoms is one of the most accurate ways to make sure the cause of your pain is not dangerous. If your caregiver needs more information, he or she may order lab work or imaging tests (X-rays or MRIs).However, even if imaging tests show changes in your back, this usually does not require surgery. HOME CARE INSTRUCTIONS For many people, back pain returns.Since low back pain is rarely dangerous, it is often a condition that people can learn to [facility] their own.   Remain active. It is stressful on the back to sit or stand in one place. Do not sit, drive, or stand in one place for more than 30 minutes at a time. Take short  walks on level surfaces as soon as pain allows.Try to increase the length of time you walk each day.  Do not stay in bed.Resting more than 1 or 2 days can delay your recovery.  Do not avoid exercise or work.Your body is made to move.It is not dangerous to be active, even though your back may hurt.Your back will likely heal faster if you return to being active before your pain is gone.  Pay attention to your body when you bend and lift. Many people have less discomfortwhen lifting if they bend their knees, keep the load close to their bodies,and avoid twisting. Often, the most comfortable positions are those that put less stress on your recovering back.  Find a comfortable position to sleep. Use a firm mattress and lie on your side with your knees slightly bent. If you lie on your back, put a pillow under your knees.  Only take over-the-counter or prescription medicines as directed by your caregiver. Over-the-counter medicines to reduce pain and inflammation are often the most helpful.Your caregiver may prescribe muscle relaxant drugs.These medicines help dull your pain so you can more quickly return to your normal activities and healthy exercise.  Put ice on the injured area.  Put ice in a plastic bag.  Place a towel between your skin and the bag.  Leave the ice on for 15-20 minutes, 3-4 times a day for the first 2 to 3  days. After that, ice and heat may be alternated to reduce pain and spasms.  Ask your caregiver about trying back exercises and gentle massage. This may be of some benefit.  Avoid feeling anxious or stressed.Stress increases muscle tension and can worsen back pain.It is important to recognize when you are anxious or stressed and learn ways to manage it.Exercise is a great option. SEEK MEDICAL CARE IF:  You have pain that is not relieved with rest or medicine.  You have pain that does not improve in 1 week.  You have new symptoms.  You are generally not  feeling well. SEEK IMMEDIATE MEDICAL CARE IF:   You have pain that radiates from your back into your legs.  You develop new bowel or bladder control problems.  You have unusual weakness or numbness in your arms or legs.  You develop nausea or vomiting.  You develop abdominal pain.  You feel faint. Document Released: 12/09/2005 Document Revised: 06/09/2012 Document Reviewed: 04/29/2011 [facility] Patient Information 2014 [place], [place].

## 2013-08-02 NOTE — Assessment & Plan Note (Signed)
Needs to reduce fried and fatty foods and red meatr due to eklevated Tg, also cheese and butter and egg yolk , rept end of the year  No med change

## 2013-08-02 NOTE — Assessment & Plan Note (Signed)
Recent lung scan noted calcification in thyroid needs [place]

## 2013-08-02 NOTE — Progress Notes (Signed)
Subjective:    Patient ID: Melissa [name], female    DOB: 1946-12-04, 67 y.o.   MRN: [id number]  HPI  Left flank pain x 1.5 weeks after 2 weeks of sleeping on the couch, at her daughter's , while her AC was being fixeed. 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 Also wanted to review recent visit with both pulmonary and cardiology and "listen to he lungs"  States the shortness of breath is definitely improved with use of the advair, still reports exertional fatigue I encourage her to commit to daily exercise and weight loss to improve endurance  Review of Systems See HPI Denies recent fever or chills. Denies sinus pressure, nasal congestion, ear pain or sore throat. Denies chest congestion, productive cough or wheezing. Denies chest pains, palpitations and leg swelling Denies abdominal pain, nausea, vomiting,diarrhea or constipation.   Denies dysuria, frequency, hesitancy or incontinence. . Denies headaches, seizures, numbness, or tingling. Denies depression, anxiety or insomnia. Denies skin break down or rash.        Objective:   Physical Exam  Patient alert and oriented and in no cardiopulmonary distress.  HEENT: No facial asymmetry, EOMI, no sinus tenderness,  oropharynx pink and moist.  Neck supple no adenopathy.  Chest: Adeqiuate air entry bilateral, crackles in bases  CVS: S1, S2 no murmurs, no S3.  ABD: Soft non tender. Bowel sounds normal.  Ext: No edema  MS: decreased  ROM thorac lumbar spine due to pain in right lumbar area, adequate in  shoulders, hips and knees.  Skin: Intact, no ulcerations or rash noted.  Psych: Good eye contact, normal affect. Memory intact not anxious or depressed appearing.  CNS: CN 2-12 intact, power, tone and sensation normal throughout.       Assessment & Plan:

## 2013-08-04 ENCOUNTER — Other Ambulatory Visit: Payer: Self-pay | Admitting: Family Medicine

## 2013-08-04 ENCOUNTER — Ambulatory Visit (HOSPITAL_COMMUNITY)
Admission: RE | Admit: 2013-08-04 | Discharge: 2013-08-04 | Disposition: A | Discharge status: Home / Self Care | Payer: Medicare Other | Source: Ambulatory Visit | Attending: Family Medicine | Admitting: Family Medicine

## 2013-08-04 DIAGNOSIS — E039 Hypothyroidism, unspecified: Secondary | ICD-10-CM

## 2013-08-04 DIAGNOSIS — E042 Nontoxic multinodular goiter: Secondary | ICD-10-CM

## 2013-08-04 DIAGNOSIS — E041 Nontoxic single thyroid nodule: Secondary | ICD-10-CM | POA: Insufficient documentation

## 2013-08-26 ENCOUNTER — Ambulatory Visit (INDEPENDENT_AMBULATORY_CARE_PROVIDER_SITE_OTHER): Payer: Medicare Other | Admitting: Otolaryngology

## 2013-08-26 ENCOUNTER — Other Ambulatory Visit: Payer: Self-pay | Admitting: Family Medicine

## 2013-08-26 ENCOUNTER — Ambulatory Visit (HOSPITAL_COMMUNITY): Payer: Medicare Other

## 2013-08-26 DIAGNOSIS — D449 Neoplasm of uncertain behavior of unspecified endocrine gland: Secondary | ICD-10-CM

## 2013-08-26 DIAGNOSIS — Z139 Encounter for screening, unspecified: Secondary | ICD-10-CM

## 2013-08-27 ENCOUNTER — Other Ambulatory Visit (INDEPENDENT_AMBULATORY_CARE_PROVIDER_SITE_OTHER): Payer: Self-pay | Admitting: Otolaryngology

## 2013-08-27 DIAGNOSIS — E041 Nontoxic single thyroid nodule: Secondary | ICD-10-CM

## 2013-08-28 ENCOUNTER — Other Ambulatory Visit: Payer: Self-pay | Admitting: Family Medicine

## 2013-08-31 ENCOUNTER — Telehealth: Payer: Self-pay

## 2013-08-31 NOTE — Telephone Encounter (Signed)
Patient aware.  Written order faxed to Dr. Felicita Gage office attention Selena Batten

## 2013-08-31 NOTE — Telephone Encounter (Signed)
pls send stamped note, with my sig, stating pt is able to hold the aspirin for 5 days prior to procedure , also let the pt know

## 2013-09-07 ENCOUNTER — Ambulatory Visit (HOSPITAL_COMMUNITY)
Admission: RE | Admit: 2013-09-07 | Discharge: 2013-09-07 | Disposition: A | Discharge status: Home / Self Care | Payer: Medicare Other | Source: Ambulatory Visit | Attending: Otolaryngology | Admitting: Otolaryngology

## 2013-09-07 ENCOUNTER — Encounter (HOSPITAL_COMMUNITY): Payer: Self-pay

## 2013-09-07 VITALS — BP 147/76 | HR 62 | Temp 98.6°F | Resp 16

## 2013-09-07 DIAGNOSIS — E049 Nontoxic goiter, unspecified: Secondary | ICD-10-CM | POA: Insufficient documentation

## 2013-09-07 DIAGNOSIS — E041 Nontoxic single thyroid nodule: Secondary | ICD-10-CM

## 2013-09-07 MED ORDER — LIDOCAINE HCL (PF) 2 % IJ SOLN
10.0000 mL | Freq: Once | INTRAMUSCULAR | Status: AC
  Administered 2013-09-07: 10 mL
  Filled 2013-09-07: qty 10

## 2013-09-07 MED ORDER — LIDOCAINE HCL (PF) 2 % IJ SOLN
INTRAMUSCULAR | Status: AC
  Administered 2013-09-07: 10 mL
  Filled 2013-09-07: qty 10

## 2013-09-07 NOTE — Progress Notes (Signed)
Thyroid biopsy complete procedure tolerated well. No signs of distress. Lidocaine 2% 1ml given by MD

## 2013-09-16 ENCOUNTER — Other Ambulatory Visit: Payer: Self-pay

## 2013-09-16 ENCOUNTER — Ambulatory Visit (INDEPENDENT_AMBULATORY_CARE_PROVIDER_SITE_OTHER): Payer: Medicare Other | Admitting: Otolaryngology

## 2013-09-16 DIAGNOSIS — D449 Neoplasm of uncertain behavior of unspecified endocrine gland: Secondary | ICD-10-CM

## 2013-09-16 MED ORDER — LEVOTHYROXINE SODIUM 50 MCG PO TABS: ORAL_TABLET | ORAL | Status: DC

## 2013-09-21 ENCOUNTER — Other Ambulatory Visit: Payer: Self-pay

## 2013-09-21 ENCOUNTER — Ambulatory Visit (HOSPITAL_COMMUNITY): Payer: Medicare Other

## 2013-09-21 ENCOUNTER — Telehealth: Payer: Self-pay | Admitting: Family Medicine

## 2013-09-21 MED ORDER — ZOLPIDEM TARTRATE 10 MG PO TABS: 10.0000 mg | ORAL_TABLET | Freq: Every evening | ORAL | Status: DC | PRN

## 2013-09-21 NOTE — Telephone Encounter (Signed)
Med refilled.

## 2013-09-23 ENCOUNTER — Ambulatory Visit (HOSPITAL_COMMUNITY): Payer: Medicare Other

## 2013-10-19 ENCOUNTER — Ambulatory Visit: Payer: Medicare Other | Admitting: Family Medicine

## 2013-10-26 ENCOUNTER — Telehealth: Payer: Self-pay | Admitting: Family Medicine

## 2013-10-27 ENCOUNTER — Other Ambulatory Visit: Payer: Self-pay

## 2013-10-27 MED ORDER — LOVASTATIN 40 MG PO TABS: ORAL_TABLET | ORAL | Status: DC

## 2013-10-27 NOTE — Telephone Encounter (Signed)
Med refilled.

## 2013-11-01 ENCOUNTER — Ambulatory Visit (INDEPENDENT_AMBULATORY_CARE_PROVIDER_SITE_OTHER): Payer: Medicare Other | Admitting: Family Medicine

## 2013-11-01 ENCOUNTER — Encounter: Payer: Self-pay | Admitting: Family Medicine

## 2013-11-01 VITALS — BP 138/72 | HR 60 | Resp 18 | Ht 65.0 in | Wt 244.0 lb

## 2013-11-01 DIAGNOSIS — E785 Hyperlipidemia, unspecified: Secondary | ICD-10-CM

## 2013-11-01 DIAGNOSIS — E669 Obesity, unspecified: Secondary | ICD-10-CM

## 2013-11-01 DIAGNOSIS — I10 Essential (primary) hypertension: Secondary | ICD-10-CM

## 2013-11-01 DIAGNOSIS — E039 Hypothyroidism, unspecified: Secondary | ICD-10-CM

## 2013-11-01 DIAGNOSIS — R1032 Left lower quadrant pain: Secondary | ICD-10-CM | POA: Insufficient documentation

## 2013-11-01 DIAGNOSIS — F329 Major depressive disorder, single episode, unspecified: Secondary | ICD-10-CM

## 2013-11-01 DIAGNOSIS — R918 Other nonspecific abnormal finding of lung field: Secondary | ICD-10-CM

## 2013-11-01 MED ORDER — METHYLPREDNISOLONE ACETATE 80 MG/ML IJ SUSP
80.0000 mg | Freq: Once | INTRAMUSCULAR | Status: AC
  Administered 2013-11-01: 80 mg via INTRAMUSCULAR

## 2013-11-01 MED ORDER — PREDNISONE 5 MG PO TABS: 5.0000 mg | ORAL_TABLET | Freq: Two times a day (BID) | ORAL | Status: AC

## 2013-11-01 MED ORDER — FLUOXETINE HCL 10 MG PO TABS: ORAL_TABLET | ORAL | Status: DC

## 2013-11-01 NOTE — Progress Notes (Signed)
Subjective:    Patient ID: Melissa [name], female    DOB: 12-25-1945, 67 y.o.   MRN: [id number]  HPI The PT is here for follow up and re-evaluation of chronic medical conditions, medication management and review of any available recent lab and radiology data.  Preventive health is updated, specifically  Cancer screening and Immunization.   Questions or concerns regarding consultations or procedures which the PT has had in the interim are  addressed. The PT denies any adverse reactions to current medications since the last visit.  C/o left groin pain x 10 days, no fever, chills , swollen glands or change in urination. Pain radiates from low back, denies lower ext weakness or numbness or incontinence of stool or urine      Review of Systems See HPI Denies recent fever or chills. Denies sinus pressure, nasal congestion, ear pain or sore throat. Denies chest congestion, productive cough or wheezing. Denies chest pains, palpitations and leg swelling Denies abdominal pain, nausea, vomiting,diarrhea or constipation.   Denies dysuria, frequency, hesitancy or incontinence.  Denies headaches, seizures, numbness, or tingling. Denies uncontrolled  depression, anxiety or insomnia. Denies skin break down or rash.        Objective:   Physical Exam  Patient alert and oriented and in no cardiopulmonary distress.  HEENT: No facial asymmetry, EOMI, no sinus tenderness,  oropharynx pink and moist.  Neck supple no adenopathy.  Chest: Clear to auscultation bilaterally.  CVS: S1, S2 no murmurs, no S3.  ABD: Soft non tender. Bowel sounds normal.  Ext: No edema  [id number]  ROM spine,adequate in  shoulders, hips and knees.  Skin: Intact, no ulcerations or rash noted.No inguinal adenopathy , no cellulitis or rash in left groin.  Psych: Good eye contact, normal affect. Memory intact not anxious or depressed appearing.  CNS: CN 2-12 intact, power, tone and sensation normal  throughout.       Assessment & Plan:

## 2013-11-01 NOTE — Patient Instructions (Addendum)
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

## 2013-11-02 LAB — COMPLETE METABOLIC PANEL WITH GFR
ALT: 13 U/L (ref 0–35)
AST: 14 U/L (ref 0–37)
BUN: 21 mg/dL (ref 6–23)
CO2: 26 mEq/L (ref 19–32)
Creat: 1.62 mg/dL — ABNORMAL HIGH (ref 0.50–1.10)
GFR, Est African American: 38 mL/min — ABNORMAL LOW
Sodium: 136 mEq/L (ref 135–145)
Total Bilirubin: 0.3 mg/dL (ref 0.3–1.2)
Total Protein: 8.6 g/dL — ABNORMAL HIGH (ref 6.0–8.3)

## 2013-11-02 LAB — HEMOGLOBIN A1C: Mean Plasma Glucose: 120 mg/dL — ABNORMAL HIGH (ref <117)

## 2013-11-02 LAB — TSH: TSH: 0.651 u[IU]/mL (ref 0.350–4.500)

## 2013-11-05 ENCOUNTER — Other Ambulatory Visit: Payer: Self-pay | Admitting: Family Medicine

## 2013-11-09 ENCOUNTER — Telehealth: Payer: Self-pay | Admitting: Family Medicine

## 2013-11-09 NOTE — Telephone Encounter (Signed)
She has gotten this in the past. Need to call patient for more info

## 2013-11-10 ENCOUNTER — Telehealth: Payer: Self-pay

## 2013-11-10 MED ORDER — MECLIZINE HCL 25 MG PO TABS: 25.0000 mg | ORAL_TABLET | Freq: Every day | ORAL | Status: DC

## 2013-11-10 NOTE — Telephone Encounter (Signed)
Refilled x1 

## 2013-11-10 NOTE — Telephone Encounter (Signed)
Attempted to return call.  No ability to leave message.

## 2013-11-10 NOTE — Telephone Encounter (Signed)
Called patient and voicemail has not been sent up. Since this has been prescribed in the past, ok to refill or wait until patient calls back?

## 2013-11-10 NOTE — Telephone Encounter (Signed)
Refill x 1 ok 

## 2013-11-11 NOTE — Telephone Encounter (Signed)
Called patient.  Voicemail is not set up. Will send letter notifying patient of attempts to reach her.

## 2013-11-15 ENCOUNTER — Ambulatory Visit (HOSPITAL_COMMUNITY): Payer: Medicare Other

## 2013-12-06 ENCOUNTER — Other Ambulatory Visit: Payer: Self-pay | Admitting: Family Medicine

## 2013-12-19 NOTE — Assessment & Plan Note (Signed)
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

## 2013-12-19 NOTE — Assessment & Plan Note (Signed)
Controlled, no change in medication  

## 2013-12-19 NOTE — Assessment & Plan Note (Signed)
Pt to continue surveillance by pulmonary Doc Juanetta Gosling

## 2013-12-19 NOTE — Assessment & Plan Note (Signed)
Elevated TG. Hyperlipidemia:Low fat diet discussed and encouraged.  No changes in medication

## 2013-12-21 ENCOUNTER — Telehealth: Payer: Self-pay

## 2013-12-21 ENCOUNTER — Other Ambulatory Visit: Payer: Self-pay

## 2013-12-22 ENCOUNTER — Telehealth: Payer: Self-pay

## 2013-12-22 NOTE — Telephone Encounter (Signed)
Noted and agree, the insurance company needs to be aware that pt's BP is being managed by [facility] trial study, so she needs to contact the Docs prescribing actually

## 2013-12-24 NOTE — Telephone Encounter (Signed)
See previous telephone message  

## 2013-12-27 NOTE — Telephone Encounter (Signed)
Spoke with patient.  She is aware that her blood pressure is to be managed by the trial study at [facility].    She would like assistance in getting sooner appointment with Dr. Lowanda Foster.   Will send request to Dr. Florentina Addison office.

## 2013-12-29 NOTE — Telephone Encounter (Signed)
Dr. Florentina Addison office will not be able to get her in any sooner as they only see patient 2 days weekly

## 2014-01-05 ENCOUNTER — Other Ambulatory Visit (HOSPITAL_COMMUNITY): Payer: Self-pay | Admitting: Nephrology

## 2014-01-05 DIAGNOSIS — N289 Disorder of kidney and ureter, unspecified: Secondary | ICD-10-CM

## 2014-01-24 ENCOUNTER — Ambulatory Visit (HOSPITAL_COMMUNITY)
Admission: RE | Admit: 2014-01-24 | Discharge: 2014-01-24 | Disposition: A | Discharge status: Home / Self Care | Payer: Medicare Other | Source: Ambulatory Visit | Attending: Nephrology | Admitting: Nephrology

## 2014-01-24 DIAGNOSIS — Z905 Acquired absence of kidney: Secondary | ICD-10-CM | POA: Insufficient documentation

## 2014-01-24 DIAGNOSIS — N189 Chronic kidney disease, unspecified: Secondary | ICD-10-CM | POA: Insufficient documentation

## 2014-01-24 DIAGNOSIS — N289 Disorder of kidney and ureter, unspecified: Secondary | ICD-10-CM

## 2014-02-28 ENCOUNTER — Telehealth: Payer: Self-pay | Admitting: Family Medicine

## 2014-03-01 NOTE — Telephone Encounter (Signed)
Lab order faxed to [facility]

## 2014-03-10 ENCOUNTER — Other Ambulatory Visit: Payer: Self-pay | Admitting: Family Medicine

## 2014-03-10 ENCOUNTER — Encounter: Payer: Self-pay | Admitting: Family Medicine

## 2014-03-11 LAB — COMPLETE METABOLIC PANEL WITH GFR
ALT: 18 U/L (ref 0–35)
AST: 19 U/L (ref 0–37)
Albumin: 3.7 g/dL (ref 3.5–5.2)
Alkaline Phosphatase: 34 U/L — ABNORMAL LOW (ref 39–117)
BUN: 14 mg/dL (ref 6–23)
CO2: 28 mEq/L (ref 19–32)
Calcium: 9.1 mg/dL (ref 8.4–10.5)
Chloride: 104 mEq/L (ref 96–112)
Creat: 1.49 mg/dL — ABNORMAL HIGH (ref 0.50–1.10)
GFR, Est African American: 42 mL/min — ABNORMAL LOW
GFR, Est Non African American: 36 mL/min — ABNORMAL LOW
Glucose, Bld: 89 mg/dL (ref 70–99)
Potassium: 4.1 mEq/L (ref 3.5–5.3)
Sodium: 139 mEq/L (ref 135–145)
Total Bilirubin: 0.4 mg/dL (ref 0.2–1.2)
Total Protein: 7.6 g/dL (ref 6.0–8.3)

## 2014-03-11 LAB — TSH: TSH: 1.325 u[IU]/mL (ref 0.350–4.500)

## 2014-03-11 LAB — LIPID PANEL
Cholesterol: 132 mg/dL (ref 0–200)
HDL: 34 mg/dL — ABNORMAL LOW (ref >39)
LDL Cholesterol: 55 mg/dL (ref 0–99)
Total CHOL/HDL Ratio: 3.9 Ratio
Triglycerides: 213 mg/dL — ABNORMAL HIGH (ref <150)
VLDL: 43 mg/dL — ABNORMAL HIGH (ref 0–40)

## 2014-03-15 ENCOUNTER — Other Ambulatory Visit (HOSPITAL_COMMUNITY): Payer: Self-pay | Admitting: Nephrology

## 2014-03-15 DIAGNOSIS — N2889 Other specified disorders of kidney and ureter: Secondary | ICD-10-CM

## 2014-03-16 ENCOUNTER — Ambulatory Visit: Payer: Medicare Other | Admitting: Family Medicine

## 2014-03-18 ENCOUNTER — Encounter (INDEPENDENT_AMBULATORY_CARE_PROVIDER_SITE_OTHER): Payer: Self-pay | Admitting: *Deleted

## 2014-03-22 ENCOUNTER — Ambulatory Visit (HOSPITAL_COMMUNITY)
Admission: RE | Admit: 2014-03-22 | Discharge: 2014-03-22 | Disposition: A | Discharge status: Home / Self Care | Payer: Medicare Other | Source: Ambulatory Visit | Attending: Family Medicine | Admitting: Family Medicine

## 2014-03-22 ENCOUNTER — Encounter: Payer: Self-pay | Admitting: Family Medicine

## 2014-03-22 ENCOUNTER — Ambulatory Visit (INDEPENDENT_AMBULATORY_CARE_PROVIDER_SITE_OTHER): Payer: Medicare Other | Admitting: Family Medicine

## 2014-03-22 VITALS — BP 122/74 | HR 65 | Resp 16 | Wt 249.0 lb

## 2014-03-22 DIAGNOSIS — M5410 Radiculopathy, site unspecified: Secondary | ICD-10-CM

## 2014-03-22 DIAGNOSIS — R894 Abnormal immunological findings in specimens from other organs, systems and tissues: Secondary | ICD-10-CM

## 2014-03-22 DIAGNOSIS — M25569 Pain in unspecified knee: Secondary | ICD-10-CM | POA: Insufficient documentation

## 2014-03-22 DIAGNOSIS — M549 Dorsalgia, unspecified: Secondary | ICD-10-CM

## 2014-03-22 DIAGNOSIS — R768 Other specified abnormal immunological findings in serum: Secondary | ICD-10-CM | POA: Insufficient documentation

## 2014-03-22 DIAGNOSIS — M25561 Pain in right knee: Secondary | ICD-10-CM

## 2014-03-22 DIAGNOSIS — R269 Unspecified abnormalities of gait and mobility: Secondary | ICD-10-CM

## 2014-03-22 DIAGNOSIS — E785 Hyperlipidemia, unspecified: Secondary | ICD-10-CM

## 2014-03-22 DIAGNOSIS — I10 Essential (primary) hypertension: Secondary | ICD-10-CM

## 2014-03-22 DIAGNOSIS — F32A Depression, unspecified: Secondary | ICD-10-CM

## 2014-03-22 DIAGNOSIS — E039 Hypothyroidism, unspecified: Secondary | ICD-10-CM

## 2014-03-22 DIAGNOSIS — F329 Major depressive disorder, single episode, unspecified: Secondary | ICD-10-CM

## 2014-03-22 DIAGNOSIS — J8410 Pulmonary fibrosis, unspecified: Secondary | ICD-10-CM

## 2014-03-22 DIAGNOSIS — M259 Joint disorder, unspecified: Secondary | ICD-10-CM | POA: Insufficient documentation

## 2014-03-22 DIAGNOSIS — F3289 Other specified depressive episodes: Secondary | ICD-10-CM

## 2014-03-22 DIAGNOSIS — Z1239 Encounter for other screening for malignant neoplasm of breast: Secondary | ICD-10-CM

## 2014-03-22 DIAGNOSIS — IMO0002 Reserved for concepts with insufficient information to code with codable children: Secondary | ICD-10-CM

## 2014-03-22 DIAGNOSIS — R2681 Unsteadiness on feet: Secondary | ICD-10-CM

## 2014-03-22 MED ORDER — KETOROLAC TROMETHAMINE 60 MG/2ML IM SOLN
60.0000 mg | Freq: Once | INTRAMUSCULAR | Status: AC
  Administered 2014-03-22: 60 mg via INTRAMUSCULAR

## 2014-03-22 MED ORDER — METHYLPREDNISOLONE ACETATE 80 MG/ML IJ SUSP
80.0000 mg | Freq: Once | INTRAMUSCULAR | Status: AC
  Administered 2014-03-22: 80 mg via INTRAMUSCULAR

## 2014-03-22 MED ORDER — PREDNISONE (PAK) 5 MG PO TABS: ORAL_TABLET | ORAL | Status: DC

## 2014-03-22 NOTE — Progress Notes (Signed)
Subjective:    Patient ID: Melissa [name], female    DOB: 10/04/1946, 68 y.o.   MRN: [id number]  HPI The PT is here for follow up and re-evaluation of chronic medical conditions, medication management and review of any available recent lab and radiology data.  Preventive health is updated, specifically  Cancer screening and Immunization.   Questions or concerns regarding consultations or procedures which the PT has had in the interim are  Addressed.Her e recent eval by nephrology has her seeing hematology for bence Jones proteinuria , also she needs referral to rheumatology due to positive ANA The PT denies any adverse reactions to current medications since the last visit.  C/o worsening low back pain with lower extremity instability and impaired balance. States also that she is experiencing increased knee pain and instability Overwhelmed with multiple medical challenges at once    Review of Systems See HPI Denies recent fever or chills. Denies sinus pressure, nasal congestion, ear pain or sore throat. Denies chest congestion, productive cough or wheezing. Denies chest pains, palpitations and leg swelling Denies abdominal pain, nausea, vomiting,diarrhea or constipation.   Denies dysuria, frequency, hesitancy or incontinence. DDenies headaches, seizures,  Denies depression, anxiety or insomnia. Denies skin break down or rash.        Objective:   Physical Exam BP 122/74  Pulse 65  Resp 16  Wt 249 lb (112.946 kg)  SpO2 98% Patient alert and oriented and in no cardiopulmonary distress.Pt in pain  HEENT: No facial asymmetry, EOMI, no sinus tenderness,  oropharynx pink and moist.  Neck supple no adenopathy.  Chest: Clear to auscultation bilaterally.  CVS: S1, S2 no murmurs, no S3.  ABD: Soft non tender. Bowel sounds normal.  Ext: No edema  MS: decreased  ROM spine, , hips and knees.  Skin: Intact, no ulcerations or rash noted.  Psych: Good eye contact, normal  affect. Memory intact mildly anxious not  depressed appearing.  CNS: CN 2-12 intact, power, tone and sensation normal throughout.        Assessment & Plan:  Radicular low back pain uncontrolled back pain with lower extremity weakness and instability.  , anti inflammatories at visit also prednisone dose pack prescribed, also referred for PT x 4 weeks  Hyperlipidemia deteriorated Reduce fatr intake , no med change Hyperlipidemia:Low fat diet discussed and encouraged.    Positive ANA (antinuclear antibody) Pt with elevated ANA and proteinuria, refer to rheumatology for eval and treatment  Knee pain, right Deterioration in pain with instability, xray of knee , refer ortho, walking cane prescribed  Hypothyroid Controlled, no change in medication   Pulmonary fibrosis Clinically stable and followed by pulmonary  Hypertension Controlled, no change in medication   Depression Controlled, no change in medication

## 2014-03-22 NOTE — Patient Instructions (Addendum)
F/u in 6 weeks, call if you need me before  Toradol and depo medrol today for back pain due to spinal stenosis, also prednisone dose pack for 6 days prescribed.  You are referred to PT twice weekly for 4 weeks for back pain and gait training  Cane prescription today for unsteady gait.  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

## 2014-03-23 ENCOUNTER — Other Ambulatory Visit (INDEPENDENT_AMBULATORY_CARE_PROVIDER_SITE_OTHER): Payer: Self-pay | Admitting: *Deleted

## 2014-03-23 ENCOUNTER — Telehealth (INDEPENDENT_AMBULATORY_CARE_PROVIDER_SITE_OTHER): Payer: Self-pay | Admitting: *Deleted

## 2014-03-23 ENCOUNTER — Encounter (INDEPENDENT_AMBULATORY_CARE_PROVIDER_SITE_OTHER): Payer: Self-pay

## 2014-03-23 ENCOUNTER — Encounter (INDEPENDENT_AMBULATORY_CARE_PROVIDER_SITE_OTHER): Payer: Self-pay | Admitting: *Deleted

## 2014-03-23 DIAGNOSIS — Z86010 Personal history of colonic polyps: Secondary | ICD-10-CM

## 2014-03-23 DIAGNOSIS — Z1211 Encounter for screening for malignant neoplasm of colon: Secondary | ICD-10-CM

## 2014-03-23 MED ORDER — PEG-KCL-NACL-NASULF-NA ASC-C 100 G PO SOLR: 1.0000 | Freq: Once | ORAL | Status: DC

## 2014-03-23 NOTE — Telephone Encounter (Signed)
  Procedure: tcs  Reason/Indication:  Hx polyps, fam hx colon ca  Has patient had this procedure before?  Yes, 2007 -- scanned  If so, when, by whom and where?    Is there a family history of colon cancer?  Yes, father  Who?  What age when diagnosed?    Is patient diabetic?   no      Does patient have prosthetic heart valve?  no  Do you have a pacemaker?  n  Has patient ever had endocarditis? no  Has patient had joint replacement within last 12 months?  no  Does patient tend to be constipated or take laxatives? yes  Is patient on Coumadin, Plavix and/or Aspirin? yes  Medications: see EPIC  Allergies: see EPIC  Medication Adjustment: asa 2 days  Procedure date & time: 04/21/14

## 2014-03-23 NOTE — Telephone Encounter (Signed)
Patient needs movi prep 

## 2014-03-23 NOTE — Telephone Encounter (Signed)
agree

## 2014-03-24 ENCOUNTER — Ambulatory Visit (HOSPITAL_COMMUNITY)
Admission: RE | Admit: 2014-03-24 | Discharge: 2014-03-24 | Disposition: A | Discharge status: Home / Self Care | Payer: Medicare Other | Source: Ambulatory Visit | Attending: Nephrology | Admitting: Nephrology

## 2014-03-24 ENCOUNTER — Ambulatory Visit (HOSPITAL_COMMUNITY)
Admission: RE | Admit: 2014-03-24 | Discharge: 2014-03-24 | Disposition: A | Discharge status: Home / Self Care | Payer: Medicare Other | Source: Ambulatory Visit | Attending: Family Medicine | Admitting: Family Medicine

## 2014-03-24 DIAGNOSIS — N2889 Other specified disorders of kidney and ureter: Secondary | ICD-10-CM

## 2014-03-24 DIAGNOSIS — R9389 Abnormal findings on diagnostic imaging of other specified body structures: Secondary | ICD-10-CM | POA: Insufficient documentation

## 2014-03-24 DIAGNOSIS — Z1239 Encounter for other screening for malignant neoplasm of breast: Secondary | ICD-10-CM

## 2014-03-24 DIAGNOSIS — Z1231 Encounter for screening mammogram for malignant neoplasm of breast: Secondary | ICD-10-CM | POA: Insufficient documentation

## 2014-03-24 DIAGNOSIS — Z85528 Personal history of other malignant neoplasm of kidney: Secondary | ICD-10-CM | POA: Insufficient documentation

## 2014-03-24 LAB — POCT I-STAT CREATININE: Creatinine, Ser: 1.7 mg/dL — ABNORMAL HIGH (ref 0.50–1.10)

## 2014-03-24 MED ORDER — GADOBENATE DIMEGLUMINE 529 MG/ML IV SOLN
10.0000 mL | Freq: Once | INTRAVENOUS | Status: AC | PRN
  Administered 2014-03-24: 10 mL via INTRAVENOUS

## 2014-03-24 MED ORDER — SODIUM CHLORIDE 0.9 % IV SOLN
INTRAVENOUS | Status: AC
  Filled 2014-03-24: qty 250

## 2014-03-30 ENCOUNTER — Ambulatory Visit (HOSPITAL_COMMUNITY): Payer: Medicare Other

## 2014-03-30 ENCOUNTER — Ambulatory Visit (HOSPITAL_COMMUNITY): Payer: Medicare Other | Attending: Family Medicine | Admitting: Physical Therapy

## 2014-04-06 ENCOUNTER — Encounter (HOSPITAL_COMMUNITY): Payer: Self-pay | Admitting: Pharmacy Technician

## 2014-04-15 ENCOUNTER — Encounter (HOSPITAL_COMMUNITY): Payer: Self-pay

## 2014-04-15 ENCOUNTER — Encounter (HOSPITAL_COMMUNITY): Payer: Medicare Other | Attending: Hematology and Oncology

## 2014-04-15 ENCOUNTER — Encounter (HOSPITAL_BASED_OUTPATIENT_CLINIC_OR_DEPARTMENT_OTHER): Payer: Medicare Other

## 2014-04-15 VITALS — BP 151/85 | HR 63 | Temp 97.9°F | Resp 18 | Ht 65.5 in | Wt 243.6 lb

## 2014-04-15 DIAGNOSIS — M25569 Pain in unspecified knee: Secondary | ICD-10-CM | POA: Insufficient documentation

## 2014-04-15 DIAGNOSIS — Z85528 Personal history of other malignant neoplasm of kidney: Secondary | ICD-10-CM | POA: Insufficient documentation

## 2014-04-15 DIAGNOSIS — J449 Chronic obstructive pulmonary disease, unspecified: Secondary | ICD-10-CM | POA: Insufficient documentation

## 2014-04-15 DIAGNOSIS — D892 Hypergammaglobulinemia, unspecified: Secondary | ICD-10-CM

## 2014-04-15 DIAGNOSIS — F329 Major depressive disorder, single episode, unspecified: Secondary | ICD-10-CM | POA: Insufficient documentation

## 2014-04-15 DIAGNOSIS — M51379 Other intervertebral disc degeneration, lumbosacral region without mention of lumbar back pain or lower extremity pain: Secondary | ICD-10-CM | POA: Insufficient documentation

## 2014-04-15 DIAGNOSIS — M5137 Other intervertebral disc degeneration, lumbosacral region: Secondary | ICD-10-CM | POA: Insufficient documentation

## 2014-04-15 DIAGNOSIS — J4489 Other specified chronic obstructive pulmonary disease: Secondary | ICD-10-CM | POA: Insufficient documentation

## 2014-04-15 DIAGNOSIS — Z905 Acquired absence of kidney: Secondary | ICD-10-CM | POA: Insufficient documentation

## 2014-04-15 DIAGNOSIS — E039 Hypothyroidism, unspecified: Secondary | ICD-10-CM | POA: Insufficient documentation

## 2014-04-15 DIAGNOSIS — M5030 Other cervical disc degeneration, unspecified cervical region: Secondary | ICD-10-CM | POA: Insufficient documentation

## 2014-04-15 DIAGNOSIS — E785 Hyperlipidemia, unspecified: Secondary | ICD-10-CM | POA: Insufficient documentation

## 2014-04-15 DIAGNOSIS — F3289 Other specified depressive episodes: Secondary | ICD-10-CM | POA: Insufficient documentation

## 2014-04-15 DIAGNOSIS — I10 Essential (primary) hypertension: Secondary | ICD-10-CM | POA: Insufficient documentation

## 2014-04-15 LAB — COMPREHENSIVE METABOLIC PANEL
ALT: 14 U/L (ref 0–35)
AST: 15 U/L (ref 0–37)
Albumin: 3.3 g/dL — ABNORMAL LOW (ref 3.5–5.2)
Alkaline Phosphatase: 42 U/L (ref 39–117)
BUN: 22 mg/dL (ref 6–23)
CALCIUM: 9.7 mg/dL (ref 8.4–10.5)
CO2: 26 mEq/L (ref 19–32)
Chloride: 99 mEq/L (ref 96–112)
Creatinine, Ser: 1.47 mg/dL — ABNORMAL HIGH (ref 0.50–1.10)
GFR calc Af Amer: 41 mL/min — ABNORMAL LOW (ref >90)
GFR calc non Af Amer: 36 mL/min — ABNORMAL LOW (ref >90)
Glucose, Bld: 132 mg/dL — ABNORMAL HIGH (ref 70–99)
Potassium: 4.1 mEq/L (ref 3.7–5.3)
SODIUM: 136 meq/L — AB (ref 137–147)
TOTAL PROTEIN: 8.7 g/dL — AB (ref 6.0–8.3)
Total Bilirubin: 0.4 mg/dL (ref 0.3–1.2)

## 2014-04-15 LAB — CBC WITH DIFFERENTIAL/PLATELET
BASOS ABS: 0 10*3/uL (ref 0.0–0.1)
Basophils Relative: 0 % (ref 0–1)
Eosinophils Absolute: 0.1 10*3/uL (ref 0.0–0.7)
Eosinophils Relative: 2 % (ref 0–5)
HCT: 32.9 % — ABNORMAL LOW (ref 36.0–46.0)
Hemoglobin: 11.7 g/dL — ABNORMAL LOW (ref 12.0–15.0)
LYMPHS PCT: 26 % (ref 12–46)
Lymphs Abs: 1.5 10*3/uL (ref 0.7–4.0)
MCH: 31.8 pg (ref 26.0–34.0)
MCHC: 35.6 g/dL (ref 30.0–36.0)
MCV: 89.4 fL (ref 78.0–100.0)
Monocytes Absolute: 0.3 10*3/uL (ref 0.1–1.0)
Monocytes Relative: 5 % (ref 3–12)
Neutro Abs: 4 10*3/uL (ref 1.7–7.7)
Neutrophils Relative %: 67 % (ref 43–77)
PLATELETS: 292 10*3/uL (ref 150–400)
RBC: 3.68 MIL/uL — ABNORMAL LOW (ref 3.87–5.11)
RDW: 16 % — AB (ref 11.5–15.5)
WBC: 6 10*3/uL (ref 4.0–10.5)

## 2014-04-15 LAB — LACTATE DEHYDROGENASE: LDH: 154 U/L (ref 94–250)

## 2014-04-15 LAB — FERRITIN: Ferritin: 92 ng/mL (ref 10–291)

## 2014-04-15 NOTE — Patient Instructions (Signed)
[place] Discharge Instructions  RECOMMENDATIONS MADE BY THE CONSULTANT AND ANY TEST RESULTS WILL BE SENT TO YOUR REFERRING PHYSICIAN.  EXAM FINDINGS BY THE PHYSICIAN TODAY AND SIGNS OR SYMPTOMS TO REPORT TO CLINIC OR PRIMARY PHYSICIAN: Exam and findings as discussed by Dr. Barnet Glasgow.  MEDICATIONS PRESCRIBED:  None  INSTRUCTIONS/FOLLOW-UP: Return in 2 weeks for follow-up visit with Dr. Barnet Glasgow.  Collect 24 hour urine.   Thank you for choosing [facility] to provide your oncology and hematology care.  To afford each patient quality time with our providers, please arrive at least 15 minutes before your scheduled appointment time.  With your help, our goal is to use those 15 minutes to complete the necessary work-up to ensure our physicians have the information they need to help with your evaluation and healthcare recommendations.    Effective January 1st, 2014, we ask that you re-schedule your appointment with our physicians should you arrive 10 or more minutes late for your appointment.  We strive to give you quality time with our providers, and arriving late affects you and other patients whose appointments are after yours.    Again, thank you for choosing [facility].  Our hope is that these requests will decrease the amount of time that you wait before being seen by our physicians.       _____________________________________________________________  Should you have questions after your visit to [facility], please contact our office at [phone] between the hours of 8:30 a.m. and 5:00 p.m.  Voicemails left after 4:30 p.m. will not be returned until the following business day.  For prescription refill requests, have your pharmacy contact our office with your prescription refill request.     24-Hour Urine Collection HOME CARE  When you get up in the morning on the day you do this test, pee (urinate) in the toilet and  flush. Make a note of the time. This will be your start time on the day of collection and the end time on the next morning.  From then on, save all your pee (urine) in the plastic jug that was given to you.  You should stop collecting your pee 24 hours after you started.  If the plastic jug that is given to you already has liquid in it, that is okay. Do not throw out the liquid or rinse out the jug. Some tests need the liquid to be added to your pee.  Keep your plastic jug cool (in an ice chest or the refrigerator) during the test.  When the 24 hours is over, bring your plastic jug to the clinic lab. Keep the jug cool (in an ice chest) while you are bringing it to the lab. Document Released: 03/07/2009 Document Revised: 03/02/2012 Document Reviewed: 03/07/2009 [facility] Patient Information 2014 [place], [place].

## 2014-04-15 NOTE — Progress Notes (Signed)
Labs drawn today for cbc/diff,cmpldh,immunofixation,ferr,mm,kllc

## 2014-04-15 NOTE — Progress Notes (Signed)
Melissa [name], M.D.  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     HISTORY OF PRESENT ILLNESS:Melissa [name] is a 68 y.o. female who is referred for evaluation of paraproteinemia in the setting of previously resected right renal cell carcinoma resected 12/04/2007. Her primary complaint is right knee pain. She denies any bone pain. Appetite is good with no nausea, vomiting, diarrhea, constipation, dysuria, hematuria, incontinence, lower extremity swelling or redness, fever, night sweats, cough, wheezing, sore throat, earache, headache, skin rash, or seizures. She does not need antibiotics frequently and denies any ecchymoses, epistaxis, melena, hematochezia, hematuria, or vaginal bleeding.   PAST MEDICAL HISTORY:  has a past medical history of Vertigo, intermittent; Hypothyroidism; Hyperlipidemia; Obesity; Hypertension; Vitiligo; Degenerative disc disease, lumbar; Degenerative disc disease, cervical; Chest pain (2008); and Renal cell carcinoma.     PAST SURGICAL HISTORY: Past Surgical History  Procedure Laterality Date  . Tubal ligation  1983    Bilateral  . Nephrectomy      Right; secondary to renal cell carcinoma     CURRENT MEDICATIONS: has a current medication list which includes the following prescription(s): amlodipine besylate, aspirin, atenolol, budesonide-formoterol, calcitriol, calcium carbonate-vit d-min, vitamin d-3, fluoxetine, furosemide, hydralazine, hydralazine, levothyroxine, levothyroxine, lovastatin, meclizine, multiple vitamins-minerals, spironolactone, zolpidem, and peg 3350 powder.   ALLERGIES: Morphine   SOCIAL HISTORY:  reports that she has never smoked.  She has never used smokeless tobacco. She reports that she does not drink alcohol or use illicit drugs.   FAMILY HISTORY: family history includes Colon cancer in her father; Diabetes in her sister; Heart disease (age of onset: 10) in her mother; Heart failure in her sister; Hypertension in her mother and sister; Lupus in her brother.    REVIEW OF SYSTEMS:  Other than that discussed above is noncontributory.    PHYSICAL EXAM:  height is 5' 5.5" (1.664 m) and weight is 243 lb 9.6 oz (110.496 kg). Her oral temperature is 97.9 F (36.6 C). Her blood pressure is 151/85 and her pulse is 63. Her respiration is 18.    GENERAL:alert, no distress and comfortable. Morbidly obese. SKIN: skin color, texture, turgor are normal, no rashes or significant lesions EYES: normal, Conjunctiva are pink and non-injected, sclera clear OROPHARYNX:no exudate, no erythema and lips, buccal mucosa, and tongue normal  NECK: supple, thyroid normal size, non-tender, without nodularity CHEST: Increased AP diameter with no breast masses. LYMPH:  no palpable lymphadenopathy in the cervical, axillary or inguinal LUNGS: clear to auscultation and percussion with normal breathing effort HEART: regular rate & rhythm and no murmurs ABDOMEN:abdomen soft, non-tender and normal bowel sounds MUSCULOSKELETALl:no cyanosis of digits, no clubbing or edema . Decreased range of motion of the right knee; no effusion. NEURO: alert & oriented x 3 with fluent speech, no focal motor/sensory deficits    LABORATORY DATA:  Infusion on 04/15/2014  Component Date Value Ref Range Status  . WBC 04/15/2014 6.0  4.0 - 10.5 K/uL Final  . RBC 04/15/2014 3.68* 3.87 - 5.11 MIL/uL Final  . Hemoglobin 04/15/2014 11.7* 12.0 - 15.0 g/dL Final  . HCT 04/15/2014 32.9* 36.0 - 46.0 % Final  . MCV 04/15/2014 89.4  78.0 - 100.0 fL Final  . [facility] 04/15/2014 31.8  ACR Breast Density Category b: There are scattered areas of fibroglandular density.  FINDINGS: There are no findings suspicious for malignancy. Images were processed with CAD.  IMPRESSION: No mammographic evidence of malignancy. A result letter of this screening mammogram will be mailed directly to the patient.  RECOMMENDATION: Screening mammogram in one year. (Code:SM-B-01Y)  BI-RADS CATEGORY  1: Negative.   Electronically Signed   By: Skipper Cliche M.D.   On: 03/25/2014 09:39    PATHOLOGY:      Results Surgical pathology converted EChart (Order [id number])            Result Information    Abnormality Status Priority Source    Final result (12/07/2007 1640) Routine          Surgical pathology converted EChart   Status: Final result Visible to patient: This result is not viewable by the patient. Next appt: Today at 10:10 AM in Oncology ([facility])            Result Narrative    [facility] [address] [place], [place] [address] [id number]  REPORT OF SURGICAL PATHOLOGY  Case #: [id number] Patient Name: [name], [name] Office Chart Number: N/A  MRN: [id number] Pathologist: Louanne Belton. Rodney Cruise, MD DOB/Age [birth year] (Age: 8) Gender: F Date Taken: 12/04/2007 Date Received: 12/04/2007  FINAL DIAGNOSIS  MICROSCOPIC EXAMINATION AND DIAGNOSIS  RIGHT KIDNEY, NEPHRECTOMY: - RENAL CELL CARCINOMA, 5.1 CM IN GREATEST DIMENSION. MARGINS NOT INVOLVED.  COMMENT ONCOLOGY TABLE-KIDNEY  1. Maximum tumor size (cm): 5.1 cm. 2. Location of tumor (right or left): Right kidney. 3. Histology: Renal cell carcinoma, clear cell type. 4. Fuhrman Nuclear Grade: 3 of 4. 5. Margins (renal vein, ureter, soft tissue): Free of tumor. 6. Renal vein invasion: No. 7. Extension through capsule: No. 8. Adrenal gland: Not  present. 9. Lymph nodes: # examined 0;# positive N/A. 10. TNM code: pT1b, pNX, pMX. 11. Non-neoplastic kidney: Unremarkable. 12. Comments: Most of the tumor has Fuhrman Nuclear Grade 2 of 4 with a few foci with a greater degree of nuclear enlargement and atypia consistent with focal nuclear grade 3 of 4. (JDP:cdc 12/07/07)  cc Date Reported: 12/07/2007 Jaquelyn Bitter A. Rodney Cruise, MD Electronically Signed Out By JDP F.   Clinical information Right renal cell carcinoma; lower pole (lw)  specimen(s) obtained Kidney, radical nephrectomy for tumor, and ureter  Gross Description Specimen: Right kidney with 5 cm of attached ureter. There is a small amount of surrounding soft perirenal fat. Weight: 236 gm with fat removed Size (cm): 11.6 x 6.6 x 6.2 cm kidney Location of tumor: Mid posterior aspect Size of tumor: There is a 5.1 x 4.5 x 4.3 cm tan yellow to red, soft to firm, well defined mass. Renal capsule: The mass abuts, but does not appear to involved the capsule. The cortical surface is smooth. Renal vein: Renal pelvis and ureter: The pelvis has a tan white to hyperemic, smooth mucosa and the ureter has a smooth mucosa with no gross lesions or masses. Non-neoplastic renal parenchyma: Red brown, with a 1.7 cm clear, fluid-filled, smooth subcortical cyst. Adrenal gland: Not included Lymph nodes: No lymph nodes are found within the fat. Renal sinus: The mass abuts, but does not grossly involve the renal sinus. Block Summary: A ureter and vascular margins B D sections of mass including renal capsule E sections of mass and adjacent parenchyma F mass adjacent to renal sinus G mass nearest pelvis H parenchyma away from mass, including cyst Total eight blocks (SW:jes 12/07/07)  ACR Breast Density Category b: There are scattered areas of fibroglandular density.  FINDINGS: There are no findings suspicious for malignancy. Images were processed with CAD.  IMPRESSION: No mammographic evidence of malignancy. A result letter of this screening mammogram will be mailed directly to the patient.  RECOMMENDATION: Screening mammogram in one year. (Code:SM-B-01Y)  BI-RADS CATEGORY  1: Negative.   Electronically Signed   By: Skipper Cliche M.D.   On: 03/25/2014 09:39    PATHOLOGY:      Results Surgical pathology converted EChart (Order [id number])            Result Information    Abnormality Status Priority Source    Final result (12/07/2007 1640) Routine          Surgical pathology converted EChart   Status: Final result Visible to patient: This result is not viewable by the patient. Next appt: Today at 10:10 AM in Oncology ([facility])            Result Narrative    [facility] [address] [place], [place] [address] [phone]  REPORT OF SURGICAL PATHOLOGY  Case #: [id number] Patient Name: [name], [name] Office Chart Number: N/A  MRN: [id number] Pathologist: Louanne Belton. Rodney Cruise, MD DOB/Age [birth year] (Age: 8) Gender: F Date Taken: 12/04/2007 Date Received: 12/04/2007  FINAL DIAGNOSIS  MICROSCOPIC EXAMINATION AND DIAGNOSIS  RIGHT KIDNEY, NEPHRECTOMY: - RENAL CELL CARCINOMA, 5.1 CM IN GREATEST DIMENSION. MARGINS NOT INVOLVED.  COMMENT ONCOLOGY TABLE-KIDNEY  1. Maximum tumor size (cm): 5.1 cm. 2. Location of tumor (right or left): Right kidney. 3. Histology: Renal cell carcinoma, clear cell type. 4. Fuhrman Nuclear Grade: 3 of 4. 5. Margins (renal vein, ureter, soft tissue): Free of tumor. 6. Renal vein invasion: No. 7. Extension through capsule: No. 8. Adrenal gland: Not  present. 9. Lymph nodes: # examined 0;# positive N/A. 10. TNM code: pT1b, pNX, pMX. 11. Non-neoplastic kidney: Unremarkable. 12. Comments: Most of the tumor has Fuhrman Nuclear Grade 2 of 4 with a few foci with a greater degree of nuclear enlargement and atypia consistent with focal nuclear grade 3 of 4. (JDP:cdc 12/07/07)  cc Date Reported: 12/07/2007 Jaquelyn Bitter A. Rodney Cruise, MD Electronically Signed Out By JDP F.   Clinical information Right renal cell carcinoma; lower pole (lw)  specimen(s) obtained Kidney, radical nephrectomy for tumor, and ureter  Gross Description Specimen: Right kidney with 5 cm of attached ureter. There is a small amount of surrounding soft perirenal fat. Weight: 236 gm with fat removed Size (cm): 11.6 x 6.6 x 6.2 cm kidney Location of tumor: Mid posterior aspect Size of tumor: There is a 5.1 x 4.5 x 4.3 cm tan yellow to red, soft to firm, well defined mass. Renal capsule: The mass abuts, but does not appear to involved the capsule. The cortical surface is smooth. Renal vein: Renal pelvis and ureter: The pelvis has a tan white to hyperemic, smooth mucosa and the ureter has a smooth mucosa with no gross lesions or masses. Non-neoplastic renal parenchyma: Red brown, with a 1.7 cm clear, fluid-filled, smooth subcortical cyst. Adrenal gland: Not included Lymph nodes: No lymph nodes are found within the fat. Renal sinus: The mass abuts, but does not grossly involve the renal sinus. Block Summary: A ureter and vascular margins B D sections of mass including renal capsule E sections of mass and adjacent parenchyma F mass adjacent to renal sinus G mass nearest pelvis H parenchyma away from mass, including cyst Total eight blocks (SW:jes 12/07/07)  Melissa [name], M.D.  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     HISTORY OF PRESENT ILLNESS:Melissa [name] is a 68 y.o. female who is referred for evaluation of paraproteinemia in the setting of previously resected right renal cell carcinoma resected 12/04/2007. Her primary complaint is right knee pain. She denies any bone pain. Appetite is good with no nausea, vomiting, diarrhea, constipation, dysuria, hematuria, incontinence, lower extremity swelling or redness, fever, night sweats, cough, wheezing, sore throat, earache, headache, skin rash, or seizures. She does not need antibiotics frequently and denies any ecchymoses, epistaxis, melena, hematochezia, hematuria, or vaginal bleeding.   PAST MEDICAL HISTORY:  has a past medical history of Vertigo, intermittent; Hypothyroidism; Hyperlipidemia; Obesity; Hypertension; Vitiligo; Degenerative disc disease, lumbar; Degenerative disc disease, cervical; Chest pain (2008); and Renal cell carcinoma.     PAST SURGICAL HISTORY: Past Surgical History  Procedure Laterality Date  . Tubal ligation  1983    Bilateral  . Nephrectomy      Right; secondary to renal cell carcinoma     CURRENT MEDICATIONS: has a current medication list which includes the following prescription(s): amlodipine besylate, aspirin, atenolol, budesonide-formoterol, calcitriol, calcium carbonate-vit d-min, vitamin d-3, fluoxetine, furosemide, hydralazine, hydralazine, levothyroxine, levothyroxine, lovastatin, meclizine, multiple vitamins-minerals, spironolactone, zolpidem, and peg 3350 powder.   ALLERGIES: Morphine   SOCIAL HISTORY:  reports that she has never smoked.  She has never used smokeless tobacco. She reports that she does not drink alcohol or use illicit drugs.   FAMILY HISTORY: family history includes Colon cancer in her father; Diabetes in her sister; Heart disease (age of onset: 10) in her mother; Heart failure in her sister; Hypertension in her mother and sister; Lupus in her brother.    REVIEW OF SYSTEMS:  Other than that discussed above is noncontributory.    PHYSICAL EXAM:  height is 5' 5.5" (1.664 m) and weight is 243 lb 9.6 oz (110.496 kg). Her oral temperature is 97.9 F (36.6 C). Her blood pressure is 151/85 and her pulse is 63. Her respiration is 18.    GENERAL:alert, no distress and comfortable. Morbidly obese. SKIN: skin color, texture, turgor are normal, no rashes or significant lesions EYES: normal, Conjunctiva are pink and non-injected, sclera clear OROPHARYNX:no exudate, no erythema and lips, buccal mucosa, and tongue normal  NECK: supple, thyroid normal size, non-tender, without nodularity CHEST: Increased AP diameter with no breast masses. LYMPH:  no palpable lymphadenopathy in the cervical, axillary or inguinal LUNGS: clear to auscultation and percussion with normal breathing effort HEART: regular rate & rhythm and no murmurs ABDOMEN:abdomen soft, non-tender and normal bowel sounds MUSCULOSKELETALl:no cyanosis of digits, no clubbing or edema . Decreased range of motion of the right knee; no effusion. NEURO: alert & oriented x 3 with fluent speech, no focal motor/sensory deficits    LABORATORY DATA:  Infusion on 04/15/2014  Component Date Value Ref Range Status  . WBC 04/15/2014 6.0  4.0 - 10.5 K/uL Final  . RBC 04/15/2014 3.68* 3.87 - 5.11 MIL/uL Final  . Hemoglobin 04/15/2014 11.7* 12.0 - 15.0 g/dL Final  . HCT 04/15/2014 32.9* 36.0 - 46.0 % Final  . MCV 04/15/2014 89.4  78.0 - 100.0 fL Final  . [facility] 04/15/2014 31.8  jes/       Specimen Collected: 12/04/07         for TOMIKO, SCHOON ([id number]) Patient: ANNE-MARIE, GENSON Collected: 06/09/2013 Client: [facility] Accession: [id number] Received:  06/10/2013 Melissa Nakayama, MD DOB: 1946/10/14 Age: 79 Gender: F Reported: 06/14/2013 [address] Patient Ph: [phone] MRN#: [id number] [facility] [place] [address] Client Acc#: Chart: Phone: [phone] Fax: LMP: Visit#: [id number].[email or web address] CC: GYNECOLOGIC CYTOLOGY REPORT Adequacy Reason Satisfactory for evaluation, endocervical/transformation zone component PRESENT. Diagnosis NEGATIVE FOR INTRAEPITHELIAL LESIONS OR MALIGNANCY. TANYA SPEED Cytotechnologist Electronic Signature (Case signed 06/11/2013) Source CervicoVaginal Pap [ThinPrep Imaged] Ancillary Testing HPV High Risk High Risk HPV: NOT DETECTED Completed by JB on 2013-06-14 Disclaimer APTIMA HPV assay on the Panther DTS system is an in vitro nucleic acid amplification test for the qualitative detection of E6/E7 viral messenger RNA of High-Risk HPV types 16, 18, 31, 33, 35, 39, 45, 51, 52, 56, 58, 59, 66, & 68. This test was validated and its appropriate performance characteristics determined by the reporting laboratory. This laboratory is certified under the JQZE-09 as qualified to perform high complexity clinical laboratory testing. Note: The PAP test is a screening test, not a diagnostic procedure and should not be used as the sole means of detecting cervical cancer. The pap technique in the best of hands is subject to both false negative and false positive result at low but significant rates as evidenced by published data. This result should be interpreted in the context of this data, plus your patient's history and clinical findings Report signed out from the following location(s) [place], [place], [place] [address].    FINAL for IYONNA, RISH ([id number]) Patient: JALINE, PINCOCK Collected: 09/07/2013 Client: [facility] Accession: [id number] Received: 09/07/2013 Rolm Baptise DOB: 1946-11-23 Age: 48 Gender: F Reported: 09/08/2013 [address] Patient Ph: [phone] MRN#: [id number] [facility] [place] [address] Client Acc#: Chart: Phone: [phone] Fax: LMP: Visit#: [id number] CC: Raylene Miyamoto, MD CYTOPATHOLOGY REPORT Adequacy Reason Satisfactory For Evaluation. Diagnosis THYROID, FINE NEEDLE ASPIRATION, RIGHT (PART 1 OF 1 COLLECTED 09/07/13): FINDINGS CONSISTENT WITH NON-NEOPLASTIC GOITER. Vicente Males MD Pathologist, Electronic Signature (Case signed 09/08/2013) Source Thyroid, Fine Needle Aspiration, ,Right (Part 1 of 1 collected 09/07/13) Gross Specimen: Received is/are 3 slides in 95% Ethyl alcohol, 3 air dried for Diff stain. 30cc's of light pink Cytolyt solution. (BS:bs) Prepared: # Smears: 6 # Concentration Technique Slides (i.e. ThinPrep): 1 # Cell Block: Cell block attempted, not obtained Additional Studies: n/a Report signed out from the following location(s) Technical Component and Interpretation performed at [place], [place], [place]    IMPRESSION:  #1. 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 #2. History of right renal cell carcinoma, status post resection with no postop therapy, surgery performed in December of 2008. #3. Hypertension, on treatment. #4. Height thyroidism, on treatment. #5. Hyperlipidemia, on treatment. #6. Depression, on treatment. #7. Chronic obstructive pulmonary disease, on treatment.   PLAN:  #1. Additional lab tests were done today in addition to 24-hour urine immunofixation. #2. The patient was reassured. #3. Followup in 2 weeks with no labs.   Farrel Gobble, MD 04/15/2014 11:44 AM   DISCLAIMER:  This note was dictated with voice recognition softwre.  Similar sounding words can inadvertently be transcribed inaccurately and may not be corrected upon review.

## 2014-04-18 ENCOUNTER — Ambulatory Visit (HOSPITAL_COMMUNITY)
Admission: RE | Admit: 2014-04-18 | Discharge: 2014-04-18 | Disposition: A | Discharge status: Home / Self Care | Payer: Medicare Other | Source: Ambulatory Visit | Attending: Family Medicine | Admitting: Family Medicine

## 2014-04-18 DIAGNOSIS — M545 Low back pain, unspecified: Secondary | ICD-10-CM | POA: Insufficient documentation

## 2014-04-18 DIAGNOSIS — I10 Essential (primary) hypertension: Secondary | ICD-10-CM | POA: Insufficient documentation

## 2014-04-18 DIAGNOSIS — IMO0001 Reserved for inherently not codable concepts without codable children: Secondary | ICD-10-CM | POA: Insufficient documentation

## 2014-04-18 DIAGNOSIS — R262 Difficulty in walking, not elsewhere classified: Secondary | ICD-10-CM

## 2014-04-18 DIAGNOSIS — M5410 Radiculopathy, site unspecified: Secondary | ICD-10-CM | POA: Insufficient documentation

## 2014-04-18 DIAGNOSIS — R29898 Other symptoms and signs involving the musculoskeletal system: Secondary | ICD-10-CM | POA: Insufficient documentation

## 2014-04-18 NOTE — Addendum Note (Signed)
Addended by: Sherry Ruffing D on: 04/18/2014 10:35 AM   Modules accepted: Orders

## 2014-04-18 NOTE — Evaluation (Signed)
Physical Therapy Evaluation  Patient Details  Name: Melissa [name] MRN: [id number] Date of Birth: [birth year]  Today's Date: 04/18/2014 Time: 1025-1105 PT Time Calculation (min): 40 min Charge: Evaluation             Visit#: 1 of 8  Re-eval: 05/18/14 Assessment Diagnosis: Low back pain with radiculopathy Next MD Visit: 04/26/2014  Authorization: [facility]    Past Medical History:  Past Medical History  Diagnosis Date  . Vertigo, intermittent   . Hypothyroidism   . Hyperlipidemia   . Obesity   . Hypertension     severe and resistant to treatment; 2008-negative left renal angiogram  . Vitiligo   . Degenerative disc disease, lumbar   . Degenerative disc disease, cervical   . Chest pain 2008    2008-normal coronary angiography  . Renal cell carcinoma     Right nephrectomy   Past Surgical History:  Past Surgical History  Procedure Laterality Date  . Tubal ligation  1983    Bilateral  . Nephrectomy      Right; secondary to renal cell carcinoma    Subjective Symptoms/Limitations Symptoms: Ms Gingras states that her back  and Rt knee has been bothering her for three months.  She states the back pain is chronic but the radiating to the knee is new.  She has been using pain patches on which helps about 80% and helps her get to sleep because she is unable to sleep without the pain patch.  She states she notices that she is not sturdy when walking. She has been referred to therapy to try and improve her sx of pain and maximize her functional status. Pertinent History: Pt has had Rt renal  cell carcinoma dx 12/12/ 2008;  Pt has recently been dx with paraproteinemia secondary to this.   How long can you sit comfortably?: as long as she is sitting she is alright. How long can you stand comfortably?: Pt is unable to stand less than five minutes at the kitchen counter and then she begins to lean over due to the pain.  How long can you walk comfortably?: Pt is able to walk less than two  minutes.  She is unable to walk from her bedroom to the bathroom without wanting to flop down.   Pain Assessment Currently in Pain?: No/denies (Pt back pain goes as high as a 6/10; knee goes as high as an 8/10) Pain Relieving Factors: alleve; pain patch  Effect of Pain on Daily Activities: sometimes increases    Prior Functioning  Prior Function  Able to Take Stairs?:  (pt states she has to go up and down steps sideways holdingon) Vocation: Retired Leisure: Hobbies-yes (Comment) Comments: sewing, crafts, cookingn   Sensation/Coordination/Flexibility/Functional Tests Functional Tests Functional Tests: foto 38  Assessment RLE Strength Right Hip Flexion: 3+/5 Right Hip Extension: 3/5 Right Hip ABduction: 4/5 Right Knee Flexion: 3+/5 Right Knee Extension: 3+/5 Right Ankle Dorsiflexion: 5/5 LLE Strength Left Hip Flexion: 3+/5 Left Hip Extension: 3/5 Left Hip ABduction: 4/5 Left Knee Flexion: 3+/5 Left Knee Extension: 3+/5 Left Ankle Dorsiflexion: 5/5 Lumbar AROM Lumbar Flexion: wnl no change  Lumbar Extension: wnl with reps improving pain Lumbar - Right Side Bend: wnl no change Lumbar - Left Side Bend: wnl pinch in Rt side  Lumbar - Right Rotation: decreased 20% Lumbar - Left Rotation: wnl  Exercise/Treatments Mobility/Balance  Posture/Postural Control Posture/Postural Control: Postural limitations Postural Limitations: decreased lordosis and kyphosis   Standing Heel Raises: 10 reps Seated [employer]  on Chair: 10 reps Supine Ab Set: 10 reps Glut Set: 10 reps Bridge: 10 reps Other Supine Lumbar Exercises: hip isometric adduction x 10       Physical Therapy Assessment and Plan PT Assessment and Plan Clinical Impression Statement: Pt is a 68 yo female with radicular back pain who has been referred for therapy to decrease her pain as well as improve her stability.  Exam demonstrates decreased LE strength B as well as decreased core strength. Pt will benefit  from skilled care to  increase pt LE and core strength to improve pt functional ability.   Pt will benefit from skilled therapeutic intervention in order to improve on the following deficits: Decreased activity tolerance;Decreased balance;Decreased strength;Difficulty walking;Pain Rehab Potential: Good PT Frequency: Min 2X/week PT Duration: 4 weeks PT Treatment/Interventions: Therapeutic activities;Therapeutic exercise;Patient/family education;Balance training PT Plan: continue with core strengthening exercises progress to standing as soon as possible to work on both strength and balace.     Goals Home Exercise Program Pt/caregiver will Perform Home Exercise Program: For increased strengthening PT Goal: Perform Home Exercise Program - Progress: Goal set today PT Short Term Goals Time to Complete Short Term Goals: 2 weeks PT Short Term Goal 1: Pain no greater than a 5/10 80% of the day PT Short Term Goal 2: Pt able to walk for 10 minutes in good posture. PT Short Term Goal 3: Pt to be able to stand for 15 mintues to make a small meal PT Long Term Goals Time to Complete Long Term Goals: 4 weeks PT Long Term Goal 1: I in advance HEP PT Long Term Goal 2: pt to be able to walk for 30  minutes at 3 times a week for good health habits Long Term Goal 3: Pain no greater than a 3/10  Long Term Goal 4: pt to be able to stand for 30 minutes to make a meal  PT Long Term Goal 5: able to go up and down steps so she can visit her daughter.   Problem List Patient Active Problem List   Diagnosis Date Noted  . Radicular low back pain 04/18/2014  . Difficulty walking 04/18/2014  . Bilateral leg weakness 04/18/2014  . Positive ANA (antinuclear antibody) 03/22/2014  . Knee pain, right 03/22/2014  . Left groin pain 11/01/2013  . Lung nodules 08/02/2013  . Pulmonary fibrosis 03/29/2013  . Routine general medical examination at a health care facility 01/23/2013  . Anemia 08/13/2012  . Dyspnea on  exertion 04/14/2012  . Hyperlipidemia   . Hypertension   . History of kidney cancer   . Chest pain   . Vitiligo 04/01/2011  . HEARING LOSS 07/18/2010  . Depression 05/18/2010  . VITAMIN D DEFICIENCY 10/24/2009  . Hypothyroid 03/31/2008  . Obesity 03/31/2008  . VERTIGO, INTERMITTENT 03/31/2008    PT - End of Session Activity Tolerance: Patient tolerated treatment well General Behavior During Therapy: WFL for tasks assessed/performed PT Plan of Care PT Home Exercise Plan: given  GP Functional Assessment Tool Used: foto Functional Limitation: Other PT primary Other PT Primary Current Status (E9528): At least 60 percent but less than 80 percent impaired, limited or restricted Other PT Primary Goal Status (U1324): At least 40 percent but less than 60 percent impaired, limited or restricted  Bella Kennedy 04/18/2014, 1:50 PM  Physician Documentation Your signature is required to indicate approval of the treatment plan as stated above.  Please sign and either send electronically or make a copy of this report for  your files and return this physician signed original.   Please mark one 1.__approve of plan  2. ___approve of plan with the following conditions.   ______________________________                                                          _____________________ Physician Signature                                                                                                             Date

## 2014-04-19 LAB — KAPPA/LAMBDA LIGHT CHAINS
KAPPA FREE LGHT CHN: 5.07 mg/dL — AB (ref 0.33–1.94)
KAPPA, LAMDA LIGHT CHAIN RATIO: 1.68 — AB (ref 0.26–1.65)
Lambda free light chains: 3.02 mg/dL — ABNORMAL HIGH (ref 0.57–2.63)

## 2014-04-20 ENCOUNTER — Emergency Department (HOSPITAL_COMMUNITY): Payer: Medicare Other

## 2014-04-20 ENCOUNTER — Emergency Department (HOSPITAL_COMMUNITY)
Admission: EM | Admit: 2014-04-20 | Discharge: 2014-04-20 | Disposition: A | Discharge status: Home / Self Care | Payer: Medicare Other | Attending: Emergency Medicine | Admitting: Emergency Medicine

## 2014-04-20 ENCOUNTER — Encounter (HOSPITAL_COMMUNITY): Payer: Self-pay | Admitting: Emergency Medicine

## 2014-04-20 DIAGNOSIS — Z79899 Other long term (current) drug therapy: Secondary | ICD-10-CM | POA: Insufficient documentation

## 2014-04-20 DIAGNOSIS — Z7982 Long term (current) use of aspirin: Secondary | ICD-10-CM | POA: Insufficient documentation

## 2014-04-20 DIAGNOSIS — E669 Obesity, unspecified: Secondary | ICD-10-CM | POA: Insufficient documentation

## 2014-04-20 DIAGNOSIS — J3489 Other specified disorders of nose and nasal sinuses: Secondary | ICD-10-CM | POA: Insufficient documentation

## 2014-04-20 DIAGNOSIS — M791 Myalgia, unspecified site: Secondary | ICD-10-CM

## 2014-04-20 DIAGNOSIS — R05 Cough: Secondary | ICD-10-CM | POA: Insufficient documentation

## 2014-04-20 DIAGNOSIS — R109 Unspecified abdominal pain: Secondary | ICD-10-CM | POA: Insufficient documentation

## 2014-04-20 DIAGNOSIS — Z872 Personal history of diseases of the skin and subcutaneous tissue: Secondary | ICD-10-CM | POA: Insufficient documentation

## 2014-04-20 DIAGNOSIS — R51 Headache: Secondary | ICD-10-CM | POA: Insufficient documentation

## 2014-04-20 DIAGNOSIS — R059 Cough, unspecified: Secondary | ICD-10-CM | POA: Insufficient documentation

## 2014-04-20 DIAGNOSIS — I10 Essential (primary) hypertension: Secondary | ICD-10-CM | POA: Insufficient documentation

## 2014-04-20 DIAGNOSIS — R110 Nausea: Secondary | ICD-10-CM | POA: Insufficient documentation

## 2014-04-20 DIAGNOSIS — E785 Hyperlipidemia, unspecified: Secondary | ICD-10-CM | POA: Insufficient documentation

## 2014-04-20 DIAGNOSIS — Z8739 Personal history of other diseases of the musculoskeletal system and connective tissue: Secondary | ICD-10-CM | POA: Insufficient documentation

## 2014-04-20 DIAGNOSIS — Z85528 Personal history of other malignant neoplasm of kidney: Secondary | ICD-10-CM | POA: Insufficient documentation

## 2014-04-20 DIAGNOSIS — E039 Hypothyroidism, unspecified: Secondary | ICD-10-CM | POA: Insufficient documentation

## 2014-04-20 DIAGNOSIS — IMO0001 Reserved for inherently not codable concepts without codable children: Secondary | ICD-10-CM | POA: Insufficient documentation

## 2014-04-20 LAB — CBC WITH DIFFERENTIAL/PLATELET
BASOS ABS: 0 10*3/uL (ref 0.0–0.1)
BASOS PCT: 0 % (ref 0–1)
EOS ABS: 0 10*3/uL (ref 0.0–0.7)
Eosinophils Relative: 1 % (ref 0–5)
HEMATOCRIT: 31.8 % — AB (ref 36.0–46.0)
HEMOGLOBIN: 11.2 g/dL — AB (ref 12.0–15.0)
Lymphocytes Relative: 35 % (ref 12–46)
Lymphs Abs: 2.3 10*3/uL (ref 0.7–4.0)
MCH: 31.3 pg (ref 26.0–34.0)
MCHC: 35.2 g/dL (ref 30.0–36.0)
MCV: 88.8 fL (ref 78.0–100.0)
MONO ABS: 0.5 10*3/uL (ref 0.1–1.0)
Monocytes Relative: 7 % (ref 3–12)
NEUTROS ABS: 3.7 10*3/uL (ref 1.7–7.7)
Neutrophils Relative %: 57 % (ref 43–77)
Platelets: 305 10*3/uL (ref 150–400)
RBC: 3.58 MIL/uL — ABNORMAL LOW (ref 3.87–5.11)
RDW: 16.2 % — AB (ref 11.5–15.5)
WBC: 6.5 10*3/uL (ref 4.0–10.5)

## 2014-04-20 LAB — MULTIPLE MYELOMA PANEL, SERUM
ALPHA-2-GLOBULIN: 10.2 % (ref 7.1–11.8)
Albumin ELP: 44.8 % — ABNORMAL LOW (ref 55.8–66.1)
Alpha-1-Globulin: 6.2 % — ABNORMAL HIGH (ref 2.9–4.9)
BETA 2: 2.7 % — AB (ref 3.2–6.5)
BETA GLOBULIN: 4.9 % (ref 4.7–7.2)
GAMMA GLOBULIN: 31.2 % — AB (ref 11.1–18.8)
IGA: 100 mg/dL (ref 69–380)
IgG (Immunoglobin G), Serum: 2990 mg/dL — ABNORMAL HIGH (ref 690–1700)
IgM, Serum: 31 mg/dL — ABNORMAL LOW (ref 52–322)
M-SPIKE, %: 2.19 g/dL
TOTAL PROTEIN: 8.1 g/dL (ref 6.0–8.3)

## 2014-04-20 LAB — URINALYSIS, ROUTINE W REFLEX MICROSCOPIC
BILIRUBIN URINE: NEGATIVE
Glucose, UA: NEGATIVE mg/dL
Hgb urine dipstick: NEGATIVE
KETONES UR: NEGATIVE mg/dL
LEUKOCYTES UA: NEGATIVE
Nitrite: NEGATIVE
PH: 5.5 (ref 5.0–8.0)
PROTEIN: NEGATIVE mg/dL
Specific Gravity, Urine: 1.025 (ref 1.005–1.030)
Urobilinogen, UA: 0.2 mg/dL (ref 0.0–1.0)

## 2014-04-20 LAB — IMMUNOFIXATION ELECTROPHORESIS
IGA: 95 mg/dL (ref 69–380)
IGG (IMMUNOGLOBIN G), SERUM: 2860 mg/dL — AB (ref 690–1700)
IgM, Serum: 30 mg/dL — ABNORMAL LOW (ref 52–322)
Total Protein ELP: 8.1 g/dL (ref 6.0–8.3)

## 2014-04-20 LAB — BASIC METABOLIC PANEL
BUN: 18 mg/dL (ref 6–23)
CO2: 28 mEq/L (ref 19–32)
CREATININE: 1.49 mg/dL — AB (ref 0.50–1.10)
Calcium: 9.7 mg/dL (ref 8.4–10.5)
Chloride: 99 mEq/L (ref 96–112)
GFR calc non Af Amer: 35 mL/min — ABNORMAL LOW (ref >90)
GFR, EST AFRICAN AMERICAN: 41 mL/min — AB (ref >90)
Glucose, Bld: 117 mg/dL — ABNORMAL HIGH (ref 70–99)
Potassium: 4 mEq/L (ref 3.7–5.3)
Sodium: 137 mEq/L (ref 137–147)

## 2014-04-20 MED ORDER — HYDROCODONE-ACETAMINOPHEN 5-325 MG PO TABS: 1.0000 | ORAL_TABLET | Freq: Four times a day (QID) | ORAL | Status: DC | PRN

## 2014-04-20 MED ORDER — ONDANSETRON 4 MG PO TBDP
4.0000 mg | ORAL_TABLET | Freq: Once | ORAL | Status: AC
  Administered 2014-04-20: 4 mg via ORAL
  Filled 2014-04-20: qty 1

## 2014-04-20 NOTE — ED Provider Notes (Signed)
CSN: [id number]     Arrival date & time 04/20/14  0708 History  This chart was scribed for Mervin Kung, MD by Ludger Nutting, ED Scribe. This patient was seen in room APA06/APA06 and the patient's care was started 7:27 AM.     Chief Complaint  Patient presents with  . Flank Pain      The history is provided by the patient. No language interpreter was used.    HPI Comments: Melissa [name] is a 69 y.o. female who presents to the Emergency Department complaining of 2 days of constant left flank pain and lateral abdominal pain with associated nausea. She rates the pain as 8/10. She has a history of renal cell carcinoma and right nephrectomy in 2008. She also reports 2 days of cough for which she has been taking medication. She states coughing worsens the pain. She denies vomiting, diarrhea, dysuria, hematuria.   Past Medical History  Diagnosis Date  . Vertigo, intermittent   . Hypothyroidism   . Hyperlipidemia   . Obesity   . Hypertension     severe and resistant to treatment; 2008-negative left renal angiogram  . Vitiligo   . Degenerative disc disease, lumbar   . Degenerative disc disease, cervical   . Chest pain 2008    2008-normal coronary angiography  . Renal cell carcinoma     Right nephrectomy   Past Surgical History  Procedure Laterality Date  . Tubal ligation  1983    Bilateral  . Nephrectomy      Right; secondary to renal cell carcinoma   Family History  Problem Relation Age of Onset  . Heart disease Mother 49  . Hypertension Mother   . Colon cancer Father   . Hypertension Sister     x2  . Diabetes Sister     x2  . Heart failure Sister   . Lupus Brother    History  Substance Use Topics  . Smoking status: Never Smoker   . Smokeless tobacco: Never Used  . Alcohol Use: No   OB History   Grav Para Term Preterm Abortions TAB SAB Ect Mult Living                 Review of Systems  Constitutional: Negative for fever and chills.  HENT: Positive for  rhinorrhea. Negative for sore throat.   Eyes: Negative for visual disturbance.  Respiratory: Positive for cough. Negative for shortness of breath.   Cardiovascular: Negative for chest pain and leg swelling.  Gastrointestinal: Positive for nausea and abdominal pain (left lateral ). Negative for vomiting and diarrhea.  Genitourinary: Positive for flank pain. Negative for dysuria and hematuria.  Musculoskeletal: Negative for back pain and neck pain.  Skin: Negative for rash.  Neurological: Positive for headaches.  Hematological: Does not bruise/bleed easily.  Psychiatric/Behavioral: Negative for confusion.      Allergies  Morphine  Home Medications   Prior to Admission medications   Medication Sig Start Date End Date Taking? Authorizing Provider  AMLODIPINE BESYLATE PO Take 10 mg by mouth daily. One tab by mouth once daily    Historical Provider, MD  aspirin (ASPIRIN LOW DOSE) 81 MG EC tablet Take 81 mg by mouth daily. One tab by mouth once daily     Historical Provider, MD  atenolol (TENORMIN) 25 MG tablet Take 25 mg by mouth daily.    Historical Provider, MD  budesonide-formoterol (SYMBICORT) 160-4.5 MCG/ACT inhaler Inhale 2 puffs into the lungs 2 (two) times daily.  Historical Provider, MD  calcitRIOL (ROCALTROL) 0.25 MCG capsule 0.25 mcg. 1 tablet Mon Wed and Fri    Historical Provider, MD  Calcium Carbonate-Vit D-Min (CALCIUM 1200 PO) Take 1 tablet by mouth daily.     Historical Provider, MD  Cholecalciferol (VITAMIN D-3) 1000 UNITS CAPS Take 1 capsule by mouth daily.     Historical Provider, MD  FLUoxetine (PROZAC) 10 MG tablet TAKE ONE TABLET BY MOUTH EVERY DAY 11/01/13   Fayrene Helper, MD  furosemide (LASIX) 40 MG tablet Take 40 mg by mouth 2 (two) times daily.    Historical Provider, MD  hydrALAZINE (APRESOLINE) 25 MG tablet Take 25 mg by mouth 3 (three) times daily.  06/26/12   Fayrene Helper, MD  hydrALAZINE (APRESOLINE) 50 MG tablet Take 50 mg by mouth 3 (three)  times daily.    Historical Provider, MD  levothyroxine (SYNTHROID, LEVOTHROID) 100 MCG tablet TAKE ONE TABLET BY MOUTH THREE TIMES A WEEK ON [facility] AND FRIDAY 06/09/13   Fayrene Helper, MD  levothyroxine (SYNTHROID, LEVOTHROID) 50 MCG tablet TAKE ONE TABLET BY MOUTH 4 TIMES A WEEK TAKE  ON  [facility]  AND  SUNDAY 09/16/13   Fayrene Helper, MD  lovastatin (MEVACOR) 40 MG tablet TAKE ONE TABLET BY MOUTH ONCE DAILY AT BEDTIME    Fayrene Helper, MD  meclizine (ANTIVERT) 25 MG tablet TAKE ONE TABLET BY MOUTH ONCE DAILY AS NEEDED FOR  VERTIGO    Fayrene Helper, MD  Multiple Vitamins-Minerals (ONE-A-DAY EXTRAS ANTIOXIDANT PO) Take by mouth. Take one tablet by mouth     Historical Provider, MD  peg 3350 powder (MOVIPREP) 100 G SOLR Take 1 kit (200 g total) by mouth once. 03/23/14   Butch Penny, NP  spironolactone (ALDACTONE) 25 MG tablet Take 50 mg by mouth daily.     Historical Provider, MD  zolpidem (AMBIEN) 10 MG tablet Take 1 tablet (10 mg total) by mouth at bedtime as needed. One tablet by mouth at bedtime 09/21/13   Fayrene Helper, MD   BP 128/67  Pulse 60  Temp(Src) 98.6 F (37 C) (Oral)  Resp 16  Ht 5' 5.5" (1.664 m)  Wt 243 lb (110.224 kg)  BMI 39.81 kg/m2  SpO2 100% Physical Exam  Nursing note and vitals reviewed. Constitutional: She is oriented to person, place, and time. She appears well-developed and well-nourished.  HENT:  Head: Normocephalic and atraumatic.  Eyes: EOM are normal.  Neck: Normal range of motion.  Cardiovascular: Normal rate, regular rhythm and normal heart sounds.   Pulmonary/Chest: Effort normal and breath sounds normal. No respiratory distress.  Abdominal: Soft. Bowel sounds are normal. She exhibits no distension. There is no tenderness.  Genitourinary:  Left CVA tenderness   Musculoskeletal: Normal range of motion. She exhibits no edema.  Neurological: She is alert and oriented to person, place, and time. No  cranial nerve deficit.  Skin: Skin is warm and dry.  Psychiatric: She has a normal mood and affect.    ED Course  Procedures (including critical care time)  DIAGNOSTIC STUDIES: Oxygen Saturation is 100% on RA, normal by my interpretation.    COORDINATION OF CARE: 7:33 AM Discussed treatment plan with pt at bedside and pt agreed to plan.   Labs Review Labs Reviewed  CBC WITH DIFFERENTIAL - Abnormal; Notable for the following:    RBC 3.58 (*)    Hemoglobin 11.2 (*)    HCT 31.8 (*)  RDW 16.2 (*)    All other components within normal limits  BASIC METABOLIC PANEL - Abnormal; Notable for the following:    Glucose, Bld 117 (*)    Creatinine, Ser 1.49 (*)    GFR calc non Af Amer 35 (*)    GFR calc Af Amer 41 (*)    All other components within normal limits  URINALYSIS, ROUTINE W REFLEX MICROSCOPIC   Results for orders placed during the hospital encounter of 04/20/14  URINALYSIS, ROUTINE W REFLEX MICROSCOPIC      Result Value Ref Range   Color, Urine YELLOW  YELLOW   APPearance CLEAR  CLEAR   Specific Gravity, Urine 1.025  1.005 - 1.030   pH 5.5  5.0 - 8.0   Glucose, UA NEGATIVE  NEGATIVE mg/dL   Hgb urine dipstick NEGATIVE  NEGATIVE   Bilirubin Urine NEGATIVE  NEGATIVE   Ketones, ur NEGATIVE  NEGATIVE mg/dL   Protein, ur NEGATIVE  NEGATIVE mg/dL   Urobilinogen, UA 0.2  0.0 - 1.0 mg/dL   Nitrite NEGATIVE  NEGATIVE   Leukocytes, UA NEGATIVE  NEGATIVE  CBC WITH DIFFERENTIAL      Result Value Ref Range   WBC 6.5  4.0 - 10.5 K/uL   RBC 3.58 (*) 3.87 - 5.11 MIL/uL   Hemoglobin 11.2 (*) 12.0 - 15.0 g/dL   HCT 31.8 (*) 36.0 - 46.0 %   MCV 88.8  78.0 - 100.0 fL   MCH 31.3  26.0 - 34.0 pg   MCHC 35.2  30.0 - 36.0 g/dL   RDW 16.2 (*) 11.5 - 15.5 %   Platelets 305  150 - 400 K/uL   Neutrophils Relative % 57  43 - 77 %   Neutro Abs 3.7  1.7 - 7.7 K/uL   Lymphocytes Relative 35  12 - 46 %   Lymphs Abs 2.3  0.7 - 4.0 K/uL   Monocytes Relative 7  3 - 12 %   Monocytes Absolute  0.5  0.1 - 1.0 K/uL   Eosinophils Relative 1  0 - 5 %   Eosinophils Absolute 0.0  0.0 - 0.7 K/uL   Basophils Relative 0  0 - 1 %   Basophils Absolute 0.0  0.0 - 0.1 K/uL  BASIC METABOLIC PANEL      Result Value Ref Range   Sodium 137  137 - 147 mEq/L   Potassium 4.0  3.7 - 5.3 mEq/L   Chloride 99  96 - 112 mEq/L   CO2 28  19 - 32 mEq/L   Glucose, Bld 117 (*) 70 - 99 mg/dL   BUN 18  6 - 23 mg/dL   Creatinine, Ser 1.49 (*) 0.50 - 1.10 mg/dL   Calcium 9.7  8.4 - 10.5 mg/dL   GFR calc non Af Amer 35 (*) >90 mL/min   GFR calc Af Amer 41 (*) >90 mL/min     Imaging Review Ct Abdomen Pelvis Wo Contrast  04/20/2014   CLINICAL DATA:  Left flank pain  EXAM: CT ABDOMEN AND PELVIS WITHOUT CONTRAST  TECHNIQUE: Multidetector CT imaging of the abdomen and pelvis was performed following the standard protocol without intravenous contrast.  COMPARISON:  MR ABDOMEN WO/W CM dated 03/24/2014  FINDINGS: There is bibasilar interstitial fibrosis.  No renal, ureteral, or bladder calculi. No obstructive uropathy. No perinephric stranding is seen. Prior right nephrectomy. The left kidney is normal. The bladder is unremarkable.  The liver demonstrates no focal abnormality. The gallbladder is unremarkable. The spleen demonstrates no  focal abnormality. The adrenal glands and pancreas are normal.  The unopacified stomach, duodenum, small intestine and large intestine are unremarkable, but evaluation is limited by lack of oral contrast. There is a normal caliber appendix in the right lower quadrant without periappendiceal inflammatory changes. There is no pneumoperitoneum, pneumatosis, or portal venous gas. There is no abdominal or pelvic free fluid. There is no lymphadenopathy. In the uterus and ovaries are normal.  The abdominal aorta is normal in caliber with atherosclerosis.  There is no lytic or sclerotic osseous lesion. There is lower lumbar spine spondylosis most severe at L5-S1.  IMPRESSION: 1.  No urolithiasis or  obstructive uropathy.  2.  No acute abdominal or pelvic pathology.  3. Prior right nephrectomy. No evidence of recurrent or residual malignancy in the surgical bed.   Electronically Signed   By: Kathreen Devoid   On: 04/20/2014 09:25     EKG Interpretation None      MDM   Final diagnoses:  Muscular aches  Flank pain    Workup for the left flank pain without any evidence of it being related to the left kidney. Renal function is baseline. Suspect is musculoskeletal since is worse with coughing and came on after a severe cough on Monday. We'll treat with hydrocodone and have her followup with her regular Dr.  I personally performed the services described in this documentation, which was scribed in my presence. The recorded information has been reviewed and is accurate.     Mervin Kung, MD 04/20/14 [phone]

## 2014-04-20 NOTE — Discharge Instructions (Signed)
Workup without any significant findings. CT scan negative renal function at baseline. Suspect this is muscular left flank pain. Take pain medicine as directed. Followup with your doctor if not improved in a few days. Return for any newer worse symptoms.

## 2014-04-20 NOTE — ED Notes (Signed)
Left flank pain with nausea starting last night

## 2014-04-21 ENCOUNTER — Ambulatory Visit (HOSPITAL_COMMUNITY)
Admission: RE | Admit: 2014-04-21 | Discharge: 2014-04-21 | Disposition: A | Discharge status: Home / Self Care | Payer: Medicare Other | Source: Ambulatory Visit | Attending: Internal Medicine | Admitting: Internal Medicine

## 2014-04-21 ENCOUNTER — Encounter (HOSPITAL_COMMUNITY): Payer: Self-pay | Admitting: *Deleted

## 2014-04-21 ENCOUNTER — Encounter (HOSPITAL_COMMUNITY)
Admission: RE | Disposition: A | Discharge status: Still In Hospital | Payer: Self-pay | Source: Ambulatory Visit | Attending: Internal Medicine

## 2014-04-21 ENCOUNTER — Ambulatory Visit (HOSPITAL_COMMUNITY)
Admission: RE | Admit: 2014-04-21 | Discharge: 2014-04-21 | Disposition: A | Discharge status: Still In Hospital | Payer: Medicare Other | Source: Ambulatory Visit | Attending: Internal Medicine | Admitting: Internal Medicine

## 2014-04-21 ENCOUNTER — Encounter (HOSPITAL_COMMUNITY)
Admission: RE | Disposition: A | Discharge status: Home / Self Care | Payer: Self-pay | Source: Ambulatory Visit | Attending: Internal Medicine

## 2014-04-21 ENCOUNTER — Other Ambulatory Visit (HOSPITAL_COMMUNITY): Payer: Self-pay | Admitting: Hematology and Oncology

## 2014-04-21 DIAGNOSIS — Z860100 Personal history of colon polyps, unspecified: Secondary | ICD-10-CM | POA: Insufficient documentation

## 2014-04-21 DIAGNOSIS — K644 Residual hemorrhoidal skin tags: Secondary | ICD-10-CM | POA: Insufficient documentation

## 2014-04-21 DIAGNOSIS — K5730 Diverticulosis of large intestine without perforation or abscess without bleeding: Secondary | ICD-10-CM | POA: Insufficient documentation

## 2014-04-21 DIAGNOSIS — I10 Essential (primary) hypertension: Secondary | ICD-10-CM | POA: Insufficient documentation

## 2014-04-21 DIAGNOSIS — E669 Obesity, unspecified: Secondary | ICD-10-CM | POA: Insufficient documentation

## 2014-04-21 DIAGNOSIS — Z800 Family history of malignant neoplasm of digestive organs: Secondary | ICD-10-CM | POA: Insufficient documentation

## 2014-04-21 DIAGNOSIS — E785 Hyperlipidemia, unspecified: Secondary | ICD-10-CM | POA: Insufficient documentation

## 2014-04-21 DIAGNOSIS — Z1211 Encounter for screening for malignant neoplasm of colon: Secondary | ICD-10-CM | POA: Insufficient documentation

## 2014-04-21 DIAGNOSIS — R803 Bence Jones proteinuria: Secondary | ICD-10-CM

## 2014-04-21 DIAGNOSIS — M5030 Other cervical disc degeneration, unspecified cervical region: Secondary | ICD-10-CM | POA: Insufficient documentation

## 2014-04-21 DIAGNOSIS — E039 Hypothyroidism, unspecified: Secondary | ICD-10-CM | POA: Insufficient documentation

## 2014-04-21 DIAGNOSIS — D126 Benign neoplasm of colon, unspecified: Secondary | ICD-10-CM

## 2014-04-21 DIAGNOSIS — M51379 Other intervertebral disc degeneration, lumbosacral region without mention of lumbar back pain or lower extremity pain: Secondary | ICD-10-CM | POA: Insufficient documentation

## 2014-04-21 DIAGNOSIS — Z85528 Personal history of other malignant neoplasm of kidney: Secondary | ICD-10-CM | POA: Insufficient documentation

## 2014-04-21 DIAGNOSIS — M5137 Other intervertebral disc degeneration, lumbosacral region: Secondary | ICD-10-CM | POA: Insufficient documentation

## 2014-04-21 HISTORY — PX: COLONOSCOPY: SHX5424

## 2014-04-21 LAB — IMMUNOFIXATION, URINE

## 2014-04-21 SURGERY — COLONOSCOPY
Anesthesia: Moderate Sedation

## 2014-04-21 MED ORDER — SIMETHICONE 40 MG/0.6ML PO SUSP
ORAL | Status: DC | PRN
  Administered 2014-04-21: 10:00:00

## 2014-04-21 MED ORDER — SODIUM CHLORIDE 0.9 % IV SOLN
INTRAVENOUS | Status: DC
  Administered 2014-04-21: 09:00:00 via INTRAVENOUS

## 2014-04-21 MED ORDER — MEPERIDINE HCL 50 MG/ML IJ SOLN
INTRAMUSCULAR | Status: DC | PRN
  Administered 2014-04-21 (×2): 25 mg via INTRAVENOUS

## 2014-04-21 MED ORDER — MIDAZOLAM HCL 5 MG/5ML IJ SOLN
INTRAMUSCULAR | Status: DC | PRN
  Administered 2014-04-21: 3 mg via INTRAVENOUS
  Administered 2014-04-21 (×2): 2 mg via INTRAVENOUS

## 2014-04-21 MED ORDER — MEPERIDINE HCL 50 MG/ML IJ SOLN
INTRAMUSCULAR | Status: AC
  Filled 2014-04-21: qty 1

## 2014-04-21 MED ORDER — PEG 3350-KCL-NA BICARB-NACL 420 G PO SOLR
4000.0000 mL | Freq: Once | ORAL | Status: DC
  Filled 2014-04-21: qty 4000

## 2014-04-21 MED ORDER — STERILE WATER FOR IRRIGATION IR SOLN
Status: DC | PRN
  Administered 2014-04-21: 15:00:00

## 2014-04-21 MED ORDER — MIDAZOLAM HCL 5 MG/5ML IJ SOLN
INTRAMUSCULAR | Status: DC | PRN
  Administered 2014-04-21 (×2): 2 mg via INTRAVENOUS

## 2014-04-21 MED ORDER — MIDAZOLAM HCL 5 MG/5ML IJ SOLN
INTRAMUSCULAR | Status: DC
Start: 2014-04-21 — End: 2014-04-21
  Filled 2014-04-21: qty 10

## 2014-04-21 MED ORDER — MIDAZOLAM HCL 5 MG/5ML IJ SOLN
INTRAMUSCULAR | Status: AC
  Filled 2014-04-21: qty 10

## 2014-04-21 NOTE — Op Note (Signed)
Patient has completed prep and states that results are now yellow tinged liquid.

## 2014-04-21 NOTE — Discharge Instructions (Signed)
Resume usual indication and diet. No driving for 24 hours. Colonoscopy to be rescheduled.

## 2014-04-21 NOTE — H&P (Signed)
Melissa [name] is an 68 y.o. female.   Chief Complaint: Patient is here for colonoscopy. HPI: Patient is 68 year old female with history of colonic adenomas who is here for surveillance colonoscopy. Her last exam was at [facility] in August 2007 with removal of 2 small polyps and these were tubular adenomas. Patient denies abdominal pain change in her bowel habits or rectal bleeding. She has noted dropping her appetite without weight loss. Family history significant for colon carcinoma in her father who is 24 at the time of diagnosis he died within months of diagnosis.  Past Medical History  Diagnosis Date  . Vertigo, intermittent   . Hypothyroidism   . Hyperlipidemia   . Obesity   . Hypertension     severe and resistant to treatment; 2008-negative left renal angiogram  . Vitiligo   . Degenerative disc disease, lumbar   . Degenerative disc disease, cervical   . Chest pain 2008    2008-normal coronary angiography  . Renal cell carcinoma     Right nephrectomy    Past Surgical History  Procedure Laterality Date  . Tubal ligation  1983    Bilateral  . Nephrectomy      Right; secondary to renal cell carcinoma    Family History  Problem Relation Age of Onset  . Heart disease Mother 32  . Hypertension Mother   . Colon cancer Father   . Hypertension Sister     x2  . Diabetes Sister     x2  . Heart failure Sister   . Lupus Brother    Social History:  reports that she has never smoked. She has never used smokeless tobacco. She reports that she does not drink alcohol or use illicit drugs.  Allergies:  Allergies  Allergen Reactions  . Morphine Other (See Comments)    unknown    Medications Prior to Admission  Medication Sig Dispense Refill  . AMLODIPINE BESYLATE PO Take 10 mg by mouth daily. One tab by mouth once daily      . aspirin (ASPIRIN LOW DOSE) 81 MG EC tablet Take 81 mg by mouth daily. One tab by mouth once daily       . atenolol (TENORMIN) 25 MG tablet Take 25 mg by  mouth daily.      . budesonide-formoterol (SYMBICORT) 160-4.5 MCG/ACT inhaler Inhale 2 puffs into the lungs 2 (two) times daily.      . calcitRIOL (ROCALTROL) 0.25 MCG capsule 0.25 mcg. 1 tablet Mon Wed and Fri      . Calcium Carbonate-Vit D-Min (CALCIUM 1200 PO) Take 1 tablet by mouth daily.       . Cholecalciferol (VITAMIN D-3) 1000 UNITS CAPS Take 1 capsule by mouth daily.       . clarithromycin (BIAXIN) 500 MG tablet Take 1 tablet by mouth 2 (two) times daily. For 10 days. Hold lovastatin for 10 days.      Marland Kitchen FLUoxetine (PROZAC) 10 MG tablet TAKE ONE TABLET BY MOUTH EVERY DAY  30 tablet  4  . furosemide (LASIX) 40 MG tablet Take 40 mg by mouth 2 (two) times daily.      . hydrALAZINE (APRESOLINE) 25 MG tablet Take 25 mg by mouth 3 (three) times daily.       . hydrALAZINE (APRESOLINE) 50 MG tablet Take 50 mg by mouth 3 (three) times daily.      Marland Kitchen HYDROcodone-acetaminophen (NORCO/VICODIN) 5-325 MG per tablet Take 1-2 tablets by mouth every 6 (six) hours as needed for moderate pain.  14 tablet  0  . HYDROcodone-homatropine (HYCODAN) 5-1.5 MG/5ML syrup Take 5 mLs by mouth every 6 (six) hours as needed for cough.      . levothyroxine (SYNTHROID, LEVOTHROID) 100 MCG tablet TAKE ONE TABLET BY MOUTH THREE TIMES A WEEK ON MONDAY, WEDNESDAY AND FRIDAY  30 tablet  4  . levothyroxine (SYNTHROID, LEVOTHROID) 50 MCG tablet TAKE ONE TABLET BY MOUTH 4 TIMES A WEEK TAKE  ON  TUESDAY,  THURSDAY,  SATURDAY  AND  SUNDAY  30 tablet  5  . Liniments (SALONPAS ARTHRITIS PAIN RELIEF EX) Apply 1 patch topically daily as needed (pain).      Marland Kitchen lovastatin (MEVACOR) 40 MG tablet TAKE ONE TABLET BY MOUTH ONCE DAILY AT BEDTIME  30 tablet  2  . meclizine (ANTIVERT) 25 MG tablet TAKE ONE TABLET BY MOUTH ONCE DAILY AS NEEDED FOR  VERTIGO  30 tablet  0  . Multiple Vitamins-Minerals (ONE-A-DAY EXTRAS ANTIOXIDANT PO) Take by mouth. Take one tablet by mouth       . peg 3350 powder (MOVIPREP) 100 G SOLR Take 1 kit (200 g total) by  mouth once.  1 kit  0  . spironolactone (ALDACTONE) 25 MG tablet Take 50 mg by mouth daily.       Marland Kitchen zolpidem (AMBIEN) 10 MG tablet Take 1 tablet (10 mg total) by mouth at bedtime as needed. One tablet by mouth at bedtime  30 tablet  0  . naproxen sodium (ANAPROX) 220 MG tablet Take 440 mg by mouth daily as needed (pain).        Results for orders placed during the hospital encounter of 04/20/14 (from the past 48 hour(s))  URINALYSIS, ROUTINE W REFLEX MICROSCOPIC     Status: None   Collection Time    04/20/14  7:43 AM      Result Value Ref Range   Color, Urine YELLOW  YELLOW   APPearance CLEAR  CLEAR   Specific Gravity, Urine 1.025  1.005 - 1.030   pH 5.5  5.0 - 8.0   Glucose, UA NEGATIVE  NEGATIVE mg/dL   Hgb urine dipstick NEGATIVE  NEGATIVE   Bilirubin Urine NEGATIVE  NEGATIVE   Ketones, ur NEGATIVE  NEGATIVE mg/dL   Protein, ur NEGATIVE  NEGATIVE mg/dL   Urobilinogen, UA 0.2  0.0 - 1.0 mg/dL   Nitrite NEGATIVE  NEGATIVE   Leukocytes, UA NEGATIVE  NEGATIVE   Comment: MICROSCOPIC NOT DONE ON URINES WITH NEGATIVE PROTEIN, BLOOD, LEUKOCYTES, NITRITE, OR GLUCOSE <1000 mg/dL.  CBC WITH DIFFERENTIAL     Status: Abnormal   Collection Time    04/20/14  7:55 AM      Result Value Ref Range   WBC 6.5  4.0 - 10.5 K/uL   RBC 3.58 (*) 3.87 - 5.11 MIL/uL   Hemoglobin 11.2 (*) 12.0 - 15.0 g/dL   HCT 31.8 (*) 36.0 - 46.0 %   MCV 88.8  78.0 - 100.0 fL   MCH 31.3  26.0 - 34.0 pg   MCHC 35.2  30.0 - 36.0 g/dL   RDW 16.2 (*) 11.5 - 15.5 %   Platelets 305  150 - 400 K/uL   Neutrophils Relative % 57  43 - 77 %   Neutro Abs 3.7  1.7 - 7.7 K/uL   Lymphocytes Relative 35  12 - 46 %   Lymphs Abs 2.3  0.7 - 4.0 K/uL   Monocytes Relative 7  3 - 12 %   Monocytes Absolute 0.5  0.1 -  1.0 K/uL   Eosinophils Relative 1  0 - 5 %   Eosinophils Absolute 0.0  0.0 - 0.7 K/uL   Basophils Relative 0  0 - 1 %   Basophils Absolute 0.0  0.0 - 0.1 K/uL  BASIC METABOLIC PANEL     Status: Abnormal   Collection  Time    04/20/14  7:55 AM      Result Value Ref Range   Sodium 137  137 - 147 mEq/L   Potassium 4.0  3.7 - 5.3 mEq/L   Chloride 99  96 - 112 mEq/L   CO2 28  19 - 32 mEq/L   Glucose, Bld 117 (*) 70 - 99 mg/dL   BUN 18  6 - 23 mg/dL   Creatinine, Ser 1.49 (*) 0.50 - 1.10 mg/dL   Calcium 9.7  8.4 - 10.5 mg/dL   GFR calc non Af Amer 35 (*) >90 mL/min   GFR calc Af Amer 41 (*) >90 mL/min   Comment: (NOTE)     The eGFR has been calculated using the CKD EPI equation.     This calculation has not been validated in all clinical situations.     eGFR's persistently <90 mL/min signify possible Chronic Kidney     Disease.   Ct Abdomen Pelvis Wo Contrast  04/20/2014   CLINICAL DATA:  Left flank pain  EXAM: CT ABDOMEN AND PELVIS WITHOUT CONTRAST  TECHNIQUE: Multidetector CT imaging of the abdomen and pelvis was performed following the standard protocol without intravenous contrast.  COMPARISON:  MR ABDOMEN WO/W CM dated 03/24/2014  FINDINGS: There is bibasilar interstitial fibrosis.  No renal, ureteral, or bladder calculi. No obstructive uropathy. No perinephric stranding is seen. Prior right nephrectomy. The left kidney is normal. The bladder is unremarkable.  The liver demonstrates no focal abnormality. The gallbladder is unremarkable. The spleen demonstrates no focal abnormality. The adrenal glands and pancreas are normal.  The unopacified stomach, duodenum, small intestine and large intestine are unremarkable, but evaluation is limited by lack of oral contrast. There is a normal caliber appendix in the right lower quadrant without periappendiceal inflammatory changes. There is no pneumoperitoneum, pneumatosis, or portal venous gas. There is no abdominal or pelvic free fluid. There is no lymphadenopathy. In the uterus and ovaries are normal.  The abdominal aorta is normal in caliber with atherosclerosis.  There is no lytic or sclerotic osseous lesion. There is lower lumbar spine spondylosis most severe at  L5-S1.  IMPRESSION: 1.  No urolithiasis or obstructive uropathy.  2.  No acute abdominal or pelvic pathology.  3. Prior right nephrectomy. No evidence of recurrent or residual malignancy in the surgical bed.   Electronically Signed   By: Kathreen Devoid   On: 04/20/2014 09:25    ROS  Blood pressure 178/82, pulse 72, temperature 98.8 F (37.1 C), temperature source Oral, resp. rate 18, SpO2 98.00%. Physical Exam  Constitutional: She appears well-developed and well-nourished.  HENT:  Mouth/Throat: Oropharynx is clear and moist.  Eyes: Conjunctivae are normal. No scleral icterus.  Neck: No thyromegaly present.  Cardiovascular: Normal rate, regular rhythm and normal heart sounds.   No murmur heard. Respiratory: Effort normal and breath sounds normal.  GI: Soft. She exhibits no distension and no mass. There is no tenderness.  Musculoskeletal: She exhibits no edema.  Lymphadenopathy:    She has no cervical adenopathy.  Neurological: She is alert.  Skin: Skin is warm and dry.     Assessment/Plan History of colonic adenomas. Family history  of CRC in father at late onset. Surveillance colonoscopy.  Jachin Coury U Ardenia Stiner 04/21/2014, 10:03 AM

## 2014-04-21 NOTE — H&P (Signed)
Melissa [name] is an 68 y.o. female.   Chief Complaint: Patient is here for colonoscopy. HPI: Patient is 68 year old African female with history of colonic adenomas and family history of colon carcinoma and presented for colonoscopy this morning. Procedure was abandoned because of poor prep. Patient requested to have the procedure completed today. She was therefore given 4 L of NuLytely and now is returning for colonoscopy. She states she is passing watery stool. Father was diagnosed with colon carcinoma at age 34 and died within a few months.  Past Medical History  Diagnosis Date  . Vertigo, intermittent   . Hypothyroidism   . Hyperlipidemia   . Obesity   . Hypertension     severe and resistant to treatment; 2008-negative left renal angiogram  . Vitiligo   . Degenerative disc disease, lumbar   . Degenerative disc disease, cervical   . Chest pain 2008    2008-normal coronary angiography  . Renal cell carcinoma     Right nephrectomy    Past Surgical History  Procedure Laterality Date  . Tubal ligation  1983    Bilateral  . Nephrectomy      Right; secondary to renal cell carcinoma    Family History  Problem Relation Age of Onset  . Heart disease Mother 81  . Hypertension Mother   . Colon cancer Father   . Hypertension Sister     x2  . Diabetes Sister     x2  . Heart failure Sister   . Lupus Brother    Social History:  reports that she has never smoked. She has never used smokeless tobacco. She reports that she does not drink alcohol or use illicit drugs.  Allergies:  Allergies  Allergen Reactions  . Morphine Other (See Comments)    unknown    No prescriptions prior to admission    Results for orders placed during the hospital encounter of 04/20/14 (from the past 48 hour(s))  URINALYSIS, ROUTINE W REFLEX MICROSCOPIC     Status: None   Collection Time    04/20/14  7:43 AM      Result Value Ref Range   Color, Urine YELLOW  YELLOW   APPearance CLEAR  CLEAR    Specific Gravity, Urine 1.025  1.005 - 1.030   pH 5.5  5.0 - 8.0   Glucose, UA NEGATIVE  NEGATIVE mg/dL   Hgb urine dipstick NEGATIVE  NEGATIVE   Bilirubin Urine NEGATIVE  NEGATIVE   Ketones, ur NEGATIVE  NEGATIVE mg/dL   Protein, ur NEGATIVE  NEGATIVE mg/dL   Urobilinogen, UA 0.2  0.0 - 1.0 mg/dL   Nitrite NEGATIVE  NEGATIVE   Leukocytes, UA NEGATIVE  NEGATIVE   Comment: MICROSCOPIC NOT DONE ON URINES WITH NEGATIVE PROTEIN, BLOOD, LEUKOCYTES, NITRITE, OR GLUCOSE <1000 mg/dL.  CBC WITH DIFFERENTIAL     Status: Abnormal   Collection Time    04/20/14  7:55 AM      Result Value Ref Range   WBC 6.5  4.0 - 10.5 K/uL   RBC 3.58 (*) 3.87 - 5.11 MIL/uL   Hemoglobin 11.2 (*) 12.0 - 15.0 g/dL   HCT 31.8 (*) 36.0 - 46.0 %   MCV 88.8  78.0 - 100.0 fL   MCH 31.3  26.0 - 34.0 pg   MCHC 35.2  30.0 - 36.0 g/dL   RDW 16.2 (*) 11.5 - 15.5 %   Platelets 305  150 - 400 K/uL   Neutrophils Relative % 57  43 - 77 %  Neutro Abs 3.7  1.7 - 7.7 K/uL   Lymphocytes Relative 35  12 - 46 %   Lymphs Abs 2.3  0.7 - 4.0 K/uL   Monocytes Relative 7  3 - 12 %   Monocytes Absolute 0.5  0.1 - 1.0 K/uL   Eosinophils Relative 1  0 - 5 %   Eosinophils Absolute 0.0  0.0 - 0.7 K/uL   Basophils Relative 0  0 - 1 %   Basophils Absolute 0.0  0.0 - 0.1 K/uL  BASIC METABOLIC PANEL     Status: Abnormal   Collection Time    04/20/14  7:55 AM      Result Value Ref Range   Sodium 137  137 - 147 mEq/L   Potassium 4.0  3.7 - 5.3 mEq/L   Chloride 99  96 - 112 mEq/L   CO2 28  19 - 32 mEq/L   Glucose, Bld 117 (*) 70 - 99 mg/dL   BUN 18  6 - 23 mg/dL   Creatinine, Ser 1.49 (*) 0.50 - 1.10 mg/dL   Calcium 9.7  8.4 - 10.5 mg/dL   GFR calc non Af Amer 35 (*) >90 mL/min   GFR calc Af Amer 41 (*) >90 mL/min   Comment: (NOTE)     The eGFR has been calculated using the CKD EPI equation.     This calculation has not been validated in all clinical situations.     eGFR's persistently <90 mL/min signify possible Chronic Kidney      Disease.   Ct Abdomen Pelvis Wo Contrast  04/20/2014   CLINICAL DATA:  Left flank pain  EXAM: CT ABDOMEN AND PELVIS WITHOUT CONTRAST  TECHNIQUE: Multidetector CT imaging of the abdomen and pelvis was performed following the standard protocol without intravenous contrast.  COMPARISON:  MR ABDOMEN WO/W CM dated 03/24/2014  FINDINGS: There is bibasilar interstitial fibrosis.  No renal, ureteral, or bladder calculi. No obstructive uropathy. No perinephric stranding is seen. Prior right nephrectomy. The left kidney is normal. The bladder is unremarkable.  The liver demonstrates no focal abnormality. The gallbladder is unremarkable. The spleen demonstrates no focal abnormality. The adrenal glands and pancreas are normal.  The unopacified stomach, duodenum, small intestine and large intestine are unremarkable, but evaluation is limited by lack of oral contrast. There is a normal caliber appendix in the right lower quadrant without periappendiceal inflammatory changes. There is no pneumoperitoneum, pneumatosis, or portal venous gas. There is no abdominal or pelvic free fluid. There is no lymphadenopathy. In the uterus and ovaries are normal.  The abdominal aorta is normal in caliber with atherosclerosis.  There is no lytic or sclerotic osseous lesion. There is lower lumbar spine spondylosis most severe at L5-S1.  IMPRESSION: 1.  No urolithiasis or obstructive uropathy.  2.  No acute abdominal or pelvic pathology.  3. Prior right nephrectomy. No evidence of recurrent or residual malignancy in the surgical bed.   Electronically Signed   By: Kathreen Devoid   On: 04/20/2014 09:25    ROS  Blood pressure 152/70, pulse 61, temperature 98.8 F (37.1 C), temperature source Oral, resp. rate 20, SpO2 100.00%. Physical Exam  Constitutional: She appears well-developed and well-nourished.  HENT:  Mouth/Throat: Oropharynx is clear and moist.  Eyes: Conjunctivae are normal. No scleral icterus.  Neck: No thyromegaly present.   Cardiovascular: Normal rate and regular rhythm.   No murmur heard. Respiratory: Effort normal and breath sounds normal.  GI: Soft. She exhibits no distension and no mass.  There is no tenderness.  Musculoskeletal: She exhibits no edema.  Lymphadenopathy:    She has no cervical adenopathy.  Neurological: She is alert.  Skin: Skin is warm and dry.     Assessment/Plan History of colonic adenomas. Family history of colon carcinoma in first degree relative at late onset. Surveillance colonoscopy  Melissa [name] 04/21/2014, 5:06 PM

## 2014-04-21 NOTE — Op Note (Signed)
COLONOSCOPY PROCEDURE REPORT  PATIENT:  Melissa [name]  MR#:  [id number] Birthdate:  1946/08/12, 68 y.o., female Endoscopist:  Dr. Rogene Houston, MD Referred By:  Dr. Tula Nakayama, MD  Procedure Date: 04/21/2014  Procedure:   Colonoscopy incomplete secondary to poor prep.  Indications:  Patient is 68 year old African female with history of colonic adenomas and is here for surveillance colonoscopy. Her last exam was in August 2007. Family history significant for colon carcinoma in her father who died within few months of diagnosis at age 65.  Informed Consent:  The procedure and risks were reviewed with the patient and informed consent was obtained.  Medications:  Demerol 50 mg IV Versed 5 mg IV  Description of procedure:  After a digital rectal exam was performed, that colonoscope was advanced from the anus through the rectum and in sigmoid colon. Patient had formed stool from the rectum and sigmoid colon. Therefore procedure abandoned and scope was withdrawn. 7-8 mm polyp noted on the way out.  Findings:   Poor prep and therefore examination could not be completed. Small sigmoid polyp to be treated when she returns for colonoscopy.   Therapeutic/Diagnostic Maneuvers Performed:   None  Complications:  None  Cecal Withdrawal Time:  NA  Impression:  Procedure abandoned secondary to poor prep. Patient had large amount of formed stool in the rectum and sigmoid colon. Small polyp noted at sigmoid colon to be removed when she returns for repeat exam.  Recommendations:  Standard instructions given. Colonoscopy to be rescheduled.  Rogene Houston  04/21/2014 10:21 AM  CC: Dr. Tula Nakayama, MD & Dr. Rayne Du ref. provider found

## 2014-04-21 NOTE — OR Nursing (Addendum)
Patient states that she has not had a bowel movement since Tuesday morning and was soft brown.  Did not start prep until last evening at 6 pm and has not had results to note.

## 2014-04-21 NOTE — Progress Notes (Signed)
Pt. Taken to pre-op to be re-prepped for colonoscopy. MD talked with pt. & family regarding findings & they verbalize full understanding.

## 2014-04-21 NOTE — Discharge Instructions (Signed)
Resume usual medications but no aspirin or NSAIDs for 1 week. High fiber diet. No driving for 24 hours. Patient will call if polyp retrieved.  Colonoscopy, Care After Refer to this sheet in the next few weeks. These instructions provide you with information on caring for yourself after your procedure. Your health care provider may also give you more specific instructions. Your treatment has been planned according to current medical practices, but problems sometimes occur. Call your health care provider if you have any problems or questions after your procedure. WHAT TO EXPECT AFTER THE PROCEDURE  After your procedure, it is typical to have the following:  A small amount of blood in your stool.  Moderate amounts of gas and mild abdominal cramping or bloating. HOME CARE INSTRUCTIONS  Do not drive, operate machinery, or sign important documents for 24 hours.  You may shower and resume your regular physical activities, but move at a slower pace for the first 24 hours.  Take frequent rest periods for the first 24 hours.  Walk around or put a warm pack on your abdomen to help reduce abdominal cramping and bloating.  Drink enough fluids to keep your urine clear or pale yellow.  You may resume your normal diet as instructed by your health care provider. Avoid heavy or fried foods that are hard to digest.  Avoid drinking alcohol for 24 hours or as instructed by your health care provider.  Only take over-the-counter or prescription medicines as directed by your health care provider.  If a tissue sample (biopsy) was taken during your procedure:  Do not take aspirin or blood thinners for 7 days, or as instructed by your health care provider.  Do not drink alcohol for 7 days, or as instructed by your health care provider.  Eat soft foods for the first 24 hours. SEEK MEDICAL CARE IF: You have persistent spotting of blood in your stool 2 3 days after the procedure. SEEK IMMEDIATE MEDICAL  CARE IF:  You have more than a small spotting of blood in your stool.  You pass large blood clots in your stool.  Your abdomen is swollen (distended).  You have nausea or vomiting.  You have a fever.  You have increasing abdominal pain that is not relieved with medicine. Document Released: 07/23/2004 Document Revised: 09/29/2013 Document Reviewed: 08/16/2013 [facility] Patient Information 2014 [place]. High-Fiber Diet Fiber is found in fruits, vegetables, and grains. A high-fiber diet encourages the addition of more whole grains, legumes, fruits, and vegetables in your diet. The recommended amount of fiber for adult males is 38 g per day. For adult females, it is 25 g per day. Pregnant and lactating women should get 28 g of fiber per day. If you have a digestive or bowel problem, ask your caregiver for advice before adding high-fiber foods to your diet. Eat a variety of high-fiber foods instead of only a select few type of foods.  PURPOSE  To increase stool bulk.  To make bowel movements more regular to prevent constipation.  To lower cholesterol.  To prevent overeating. WHEN IS THIS DIET USED?  It may be used if you have constipation and hemorrhoids.  It may be used if you have uncomplicated diverticulosis (intestine condition) and irritable bowel syndrome.  It may be used if you need help with weight management.  It may be used if you want to add it to your diet as a protective measure against atherosclerosis, diabetes, and cancer. SOURCES OF FIBER  Whole-grain breads and cereals.  Fruits, such as apples, oranges, bananas, berries, prunes, and pears.  Vegetables, such as green peas, carrots, sweet potatoes, beets, broccoli, cabbage, spinach, and artichokes.  Legumes, such split peas, soy, lentils.  Almonds. FIBER CONTENT IN FOODS Starches and Grains / Dietary Fiber (g)  Cheerios, 1 cup / 3 g  Corn Flakes cereal, 1 cup / 0.7 g  Rice crispy treat cereal, 1  cup / 0.3 g  Instant oatmeal (cooked),  cup / 2 g  Frosted wheat cereal, 1 cup / 5.1 g  Brown, long-grain rice (cooked), 1 cup / 3.5 g  White, long-grain rice (cooked), 1 cup / 0.6 g  Enriched macaroni (cooked), 1 cup / 2.5 g Legumes / Dietary Fiber (g)  Baked beans (canned, plain, or vegetarian),  cup / 5.2 g  Kidney beans (canned),  cup / 6.8 g  Pinto beans (cooked),  cup / 5.5 g Breads and Crackers / Dietary Fiber (g)  Plain or honey graham crackers, 2 squares / 0.7 g  Saltine crackers, 3 squares / 0.3 g  Plain, salted pretzels, 10 pieces / 1.8 g  Whole-wheat bread, 1 slice / 1.9 g  White bread, 1 slice / 0.7 g  Raisin bread, 1 slice / 1.2 g  Plain bagel, 3 oz / 2 g  Flour tortilla, 1 oz / 0.9 g  Corn tortilla, 1 small / 1.5 g  Hamburger or hotdog bun, 1 small / 0.9 g Fruits / Dietary Fiber (g)  Apple with skin, 1 medium / 4.4 g  Sweetened applesauce,  cup / 1.5 g  Banana,  medium / 1.5 g  Grapes, 10 grapes / 0.4 g  Orange, 1 small / 2.3 g  Raisin, 1.5 oz / 1.6 g  Melon, 1 cup / 1.4 g Vegetables / Dietary Fiber (g)  Green beans (canned),  cup / 1.3 g  Carrots (cooked),  cup / 2.3 g  Broccoli (cooked),  cup / 2.8 g  Peas (cooked),  cup / 4.4 g  Mashed potatoes,  cup / 1.6 g  Lettuce, 1 cup / 0.5 g  Corn (canned),  cup / 1.6 g  Tomato,  cup / 1.1 g Document Released: 12/09/2005 Document Revised: 06/09/2012 Document Reviewed: 03/12/2012 [facility] Patient Information 2014 [place], [place].

## 2014-04-21 NOTE — Op Note (Signed)
COLONOSCOPY PROCEDURE REPORT  PATIENT:  Melissa [name]  MR#:  [id number] Birthdate:  Sep 24, 1946, 68 y.o., female Endoscopist:  Dr. Rogene Houston, MD Referred By:  Dr. Tula Nakayama, MD  Procedure Date: 04/21/2014  Procedure:   Colonoscopy with snare polypectomy.  Indications:  Patient is 68 year old African female with history of colonic adenomas and family history of colon carcinoma and father(diagnosis at age 85 and died same year) Who is undergoing surveillance colonoscopy. Colonoscopy was attempted earlier today but had to be abandoned because she had large amount of formed stool. She has been private 4 L of NuLytely and returning for repeat exam.  Informed Consent:  The procedure and risks were reviewed with the patient and informed consent was obtained.  Medications:  Demerol 50 mg IV Versed 7 mg IV  Description of procedure:  After a digital rectal exam was performed, that colonoscope was advanced from the anus through the rectum and colon to the area of the cecum, ileocecal valve and appendiceal orifice. The cecum was deeply intubated. These structures were well-seen and photographed for the record. From the level of the cecum and ileocecal valve, the scope was slowly and cautiously withdrawn. The mucosal surfaces were carefully surveyed utilizing scope tip to flexion to facilitate fold flattening as needed. The scope was pulled down into the rectum where a thorough exam including retroflexion was performed.  Findings:   Prep was fair to satisfactory. She still had formed pieces of stool primarily in sigmoid colon and rectum. Prep in proximal half colon was satisfactory. Few scattered diverticula at sigmoid colon. 81mm polyp snared from distal sigmoid colon lost in pool of stool. Normal rectal mucosa. Small hemorrhoids below the dentate line.   Therapeutic/Diagnostic Maneuvers Performed:  See above  Complications:  None  Cecal Withdrawal Time:  13 minutes  Impression:   Examination performed to cecum. Mild sigmoid colon diverticulosis. 8 mm polyp snared from distal sigmoid colon but so far has not been retrieved in pool of stool. External hemorrhoids.  Recommendations:  Standard instructions given. High fiber diet. Next colonoscopy in 5 years.  Rogene Houston  04/21/2014 3:36 PM  CC: Dr. Tula Nakayama, MD & Dr. Rayne Du ref. provider found

## 2014-04-22 ENCOUNTER — Encounter (HOSPITAL_COMMUNITY): Payer: Self-pay | Admitting: Internal Medicine

## 2014-04-25 ENCOUNTER — Other Ambulatory Visit (HOSPITAL_COMMUNITY): Payer: Self-pay

## 2014-04-25 ENCOUNTER — Ambulatory Visit (HOSPITAL_COMMUNITY): Payer: Medicare Other | Admitting: *Deleted

## 2014-04-25 DIAGNOSIS — J8410 Pulmonary fibrosis, unspecified: Secondary | ICD-10-CM

## 2014-04-26 ENCOUNTER — Ambulatory Visit: Payer: Medicare Other | Admitting: Family Medicine

## 2014-04-27 ENCOUNTER — Ambulatory Visit (HOSPITAL_COMMUNITY)
Admission: RE | Admit: 2014-04-27 | Discharge: 2014-04-27 | Disposition: A | Discharge status: Home / Self Care | Payer: Medicare Other | Source: Ambulatory Visit | Attending: Family Medicine | Admitting: Family Medicine

## 2014-04-27 DIAGNOSIS — M25569 Pain in unspecified knee: Secondary | ICD-10-CM | POA: Insufficient documentation

## 2014-04-27 DIAGNOSIS — E785 Hyperlipidemia, unspecified: Secondary | ICD-10-CM | POA: Insufficient documentation

## 2014-04-27 DIAGNOSIS — R262 Difficulty in walking, not elsewhere classified: Secondary | ICD-10-CM | POA: Insufficient documentation

## 2014-04-27 DIAGNOSIS — IMO0001 Reserved for inherently not codable concepts without codable children: Secondary | ICD-10-CM | POA: Insufficient documentation

## 2014-04-27 DIAGNOSIS — I10 Essential (primary) hypertension: Secondary | ICD-10-CM | POA: Insufficient documentation

## 2014-04-27 DIAGNOSIS — R29898 Other symptoms and signs involving the musculoskeletal system: Secondary | ICD-10-CM | POA: Insufficient documentation

## 2014-04-27 NOTE — Progress Notes (Signed)
Physical Therapy Treatment Patient Details  Name: Melissa [name] MRN: [id number] Date of Birth: 1946/01/17  Today's Date: 04/27/2014 Time: 1018-1058 PT Time Calculation (min): 40 min Charges: Therex x 38'  Visit#: 2 of 8  Re-eval: 05/18/14  Authorization: [employer]   Subjective: Symptoms/Limitations Symptoms: Pt states that she has been completing exericses at home some, not everyday. Pain Assessment Currently in Pain?: Yes Pain Score: 8  Pain Location: Back Pain Orientation: Left  Exercise/Treatments Aerobic Stationary Bike: NuStep x 10' L3 hills #3 Supine Ab Set: 10 reps;5 seconds Glut Set: 10 reps Bent Knee Raise: 10 reps Bridge: 10 reps Isometric Hip Flexion: 10 reps Other Supine Lumbar Exercises: hip isometric adduction x 10  Sidelying Hip Abduction: 10 reps Prone  Straight Leg Raise: 10 reps  Physical Therapy Assessment and Plan PT Assessment and Plan Clinical Impression Statement: Progressed core stabilization exercises with minimal difficulty after initial cueing and demo. Pt requires multimodal cueing to improve form with functional squats. Began NuStep to improve strength and activity tolerance. Pt reports pain decrease from 8/10 to 5/10 at end of session.  Pt will benefit from skilled therapeutic intervention in order to improve on the following deficits: Decreased activity tolerance;Decreased balance;Decreased strength;Difficulty walking;Pain Rehab Potential: Good PT Frequency: Min 2X/week PT Duration: 4 weeks PT Treatment/Interventions: Therapeutic activities;Therapeutic exercise;Patient/family education;Balance training PT Plan: Continue with core strengthening exercises progress to standing as soon as possible to work on both strength and balace.      Problem List Patient Active Problem List   Diagnosis Date Noted  . 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 Radicular low back pain 04/18/2014  . Difficulty walking 04/18/2014  . Bilateral leg weakness  04/18/2014  . Positive ANA (antinuclear antibody) 03/22/2014  . Knee pain, right 03/22/2014  . Left groin pain 11/01/2013  . Lung nodules 08/02/2013  . Pulmonary fibrosis 03/29/2013  . Routine general medical examination at a health care facility 01/23/2013  . Anemia 08/13/2012  . Dyspnea on exertion 04/14/2012  . Hyperlipidemia   . Hypertension   . History of kidney cancer   . Chest pain   . Vitiligo 04/01/2011  . HEARING LOSS 07/18/2010  . Depression 05/18/2010  . VITAMIN D DEFICIENCY 10/24/2009  . Hypothyroid 03/31/2008  . Obesity 03/31/2008  . VERTIGO, INTERMITTENT 03/31/2008    PT - End of Session Activity Tolerance: Patient tolerated treatment well General Behavior During Therapy: [facility] for tasks assessed/performed  Rachelle Hora, PTA 04/27/2014, 11:00 AM

## 2014-04-29 ENCOUNTER — Encounter (HOSPITAL_COMMUNITY): Payer: Self-pay

## 2014-04-29 ENCOUNTER — Ambulatory Visit (HOSPITAL_COMMUNITY)
Admission: RE | Admit: 2014-04-29 | Discharge: 2014-04-29 | Disposition: A | Discharge status: Home / Self Care | Payer: Medicare Other | Source: Ambulatory Visit | Attending: Hematology and Oncology | Admitting: Hematology and Oncology

## 2014-04-29 ENCOUNTER — Encounter (HOSPITAL_COMMUNITY): Payer: Medicare Other | Attending: Hematology and Oncology

## 2014-04-29 ENCOUNTER — Telehealth (HOSPITAL_COMMUNITY): Payer: Self-pay

## 2014-04-29 VITALS — BP 150/67 | HR 59 | Temp 98.0°F | Resp 18 | Wt 243.2 lb

## 2014-04-29 DIAGNOSIS — R803 Bence Jones proteinuria: Secondary | ICD-10-CM

## 2014-04-29 DIAGNOSIS — R809 Proteinuria, unspecified: Secondary | ICD-10-CM | POA: Insufficient documentation

## 2014-04-29 DIAGNOSIS — Z85528 Personal history of other malignant neoplasm of kidney: Secondary | ICD-10-CM

## 2014-04-29 DIAGNOSIS — M5030 Other cervical disc degeneration, unspecified cervical region: Secondary | ICD-10-CM | POA: Insufficient documentation

## 2014-04-29 DIAGNOSIS — R937 Abnormal findings on diagnostic imaging of other parts of musculoskeletal system: Secondary | ICD-10-CM | POA: Insufficient documentation

## 2014-04-29 NOTE — Progress Notes (Signed)
Melissa [name]  OFFICE PROGRESS NOTE  Melissa Nakayama, MD [address] [place] [address]  DIAGNOSIS: Bence Ronnald Ramp proteinuria - Plan: DG Bone Survey Met  History of kidney cancer  Chief Complaint  Patient presents with  . MGUS  . 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  INTERVAL HISTORY: Melissa [name] 68 y.o. female returns for followup after additional testing to determine the significance of MGUS in addition to restaging previously diagnosed renal cell carcinoma, status post right nephrectomy.  She also complains of knee pain. Appetite has been good with no fever, night sweats, sore throat, other bone pain, cough, wheezing, abdominal pain, nausea, vomiting, diarrhea, constipation, melena, hematochezia, hematuria, vaginal bleeding, lower extremity swelling or redness, skin rash, headache, or seizures.  MEDICAL HISTORY: Past Medical History  Diagnosis Date  . Vertigo, intermittent   . Hypothyroidism   . Hyperlipidemia   . Obesity   . Hypertension     severe and resistant to treatment; 2008-negative left renal angiogram  . Vitiligo   . Degenerative disc disease, lumbar   . Degenerative disc disease, cervical   . Chest pain 2008    2008-normal coronary angiography  . Renal cell carcinoma     Right nephrectomy    INTERIM HISTORY: has Hypothyroid; VITAMIN D DEFICIENCY; Obesity; Depression; HEARING LOSS; VERTIGO, INTERMITTENT; Vitiligo; Hyperlipidemia; Hypertension; History of kidney cancer; Chest pain; Dyspnea on exertion; Anemia; Routine general medical examination at a health care facility; Pulmonary fibrosis; Lung nodules; Left groin pain; Positive ANA (antinuclear antibody); Knee pain, right; Radicular low back pain; Difficulty walking; Bilateral leg weakness; and Bence Jones proteinuria on her problem list.    ALLERGIES:   is allergic to morphine.  MEDICATIONS: has a current medication list which includes the following prescription(s): amlodipine besylate, atenolol, budesonide-formoterol, calcitriol, calcium carbonate-vit d-min, vitamin d-3, fluoxetine, furosemide, hydralazine, hydralazine, levothyroxine, levothyroxine, liniments, lovastatin, meclizine, multiple vitamins-minerals, naproxen sodium, spironolactone, zolpidem, clarithromycin, hydrocodone-acetaminophen, and hydrocodone-homatropine.  SURGICAL HISTORY:  Past Surgical History  Procedure Laterality Date  . Tubal ligation  1983    Bilateral  . Nephrectomy      Right; secondary to renal cell carcinoma  . Colonoscopy N/A 04/21/2014    Procedure: COLONOSCOPY;  Surgeon: Rogene Houston, MD;  Location: [facility];  Service: Endoscopy;  Laterality: N/A;  . Colonoscopy N/A 04/21/2014    Procedure: COLONOSCOPY;  Surgeon: Rogene Houston, MD;  Location: [facility];  Service: Endoscopy;  Laterality: N/A;  1030    FAMILY HISTORY: family history includes Colon cancer in her father; Diabetes in her sister; Heart disease (age of onset: 36) in her mother; Heart failure in her sister; Hypertension in her mother and sister; Lupus in her brother.  SOCIAL HISTORY:  reports that she has never smoked. She has never used smokeless tobacco. She reports that she does not drink alcohol or use illicit drugs.  REVIEW OF SYSTEMS:  Other than that discussed above is noncontributory.  PHYSICAL EXAMINATION: ECOG PERFORMANCE STATUS: 1 - Symptomatic but completely ambulatory  Blood pressure 150/67, pulse 59, temperature 98 F (36.7 C), temperature source Oral, resp. rate 18, weight 243 lb 3.2 oz (110.315 kg), SpO2 100.00%.  GENERAL:alert, no distress and comfortable. Moderately obese. SKIN: skin color, texture, turgor are normal, no rashes or significant lesions EYES: PERLA; Conjunctiva are pink and non-injected, sclera clear SINUSES: No  redness or tenderness over maxillary  or ethmoid sinuses OROPHARYNX:no exudate, no erythema on lips, buccal mucosa, or tongue. NECK: supple, thyroid normal size, non-tender, without nodularity. No masses CHEST: Normal AP diameter with no breast masses. LYMPH:  no palpable lymphadenopathy in the cervical, axillary or inguinal LUNGS: clear to auscultation and percussion with normal breathing effort HEART: regular rate & rhythm and no murmurs. ABDOMEN:abdomen soft, non-tender and normal bowel sounds MUSCULOSKELETAL:no cyanosis of digits and no clubbing. Range of motion normal.  NEURO: alert & oriented x 3 with fluent speech, no focal motor/sensory deficits   LABORATORY DATA: Admission on 04/20/2014, Discharged on 04/20/2014  Component Date Value Ref Range Status  . Color, Urine 04/20/2014 YELLOW  YELLOW Final  . APPearance 04/20/2014 CLEAR  CLEAR Final  . Specific Gravity, Urine 04/20/2014 1.025  1.005 - 1.030 Final  . pH 04/20/2014 5.5  5.0 - 8.0 Final  . Glucose, UA 04/20/2014 NEGATIVE  NEGATIVE mg/dL Final  . Hgb urine dipstick 04/20/2014 NEGATIVE  NEGATIVE Final  . Bilirubin Urine 04/20/2014 NEGATIVE  NEGATIVE Final  . Ketones, ur 04/20/2014 NEGATIVE  NEGATIVE mg/dL Final  . Protein, ur 04/20/2014 NEGATIVE  NEGATIVE mg/dL Final  . Urobilinogen, UA 04/20/2014 0.2  0.0 - 1.0 mg/dL Final  . Nitrite 04/20/2014 NEGATIVE  NEGATIVE Final  . Leukocytes, UA 04/20/2014 NEGATIVE  NEGATIVE Final   MICROSCOPIC NOT DONE ON URINES WITH NEGATIVE PROTEIN, BLOOD, LEUKOCYTES, NITRITE, OR GLUCOSE <1000 mg/dL.  . WBC 04/20/2014 6.5  4.0 - 10.5 K/uL Final  . RBC 04/20/2014 3.58* 3.87 - 5.11 MIL/uL Final  . Hemoglobin 04/20/2014 11.2* 12.0 - 15.0 g/dL Final  . HCT 04/20/2014 31.8* 36.0 - 46.0 % Final  . MCV 04/20/2014 88.8  78.0 - 100.0 fL Final  . MCH 04/20/2014 31.3  26.0 - 34.0 pg Final  . MCHC 04/20/2014 35.2  30.0 - 36.0 g/dL Final  . RDW 04/20/2014 16.2* 11.5 - 15.5 % Final  . Platelets 04/20/2014 305  150 - 400 K/uL Final  .  Neutrophils Relative % 04/20/2014 57  43 - 77 % Final  . Neutro Abs 04/20/2014 3.7  1.7 - 7.7 K/uL Final  . Lymphocytes Relative 04/20/2014 35  12 - 46 % Final  . Lymphs Abs 04/20/2014 2.3  0.7 - 4.0 K/uL Final  . Monocytes Relative 04/20/2014 7  3 - 12 % Final  . Monocytes Absolute 04/20/2014 0.5  0.1 - 1.0 K/uL Final  . Eosinophils Relative 04/20/2014 1  0 - 5 % Final  . Eosinophils Absolute 04/20/2014 0.0  0.0 - 0.7 K/uL Final  . Basophils Relative 04/20/2014 0  0 - 1 % Final  . Basophils Absolute 04/20/2014 0.0  0.0 - 0.1 K/uL Final  . Sodium 04/20/2014 137  137 - 147 mEq/L Final  . Potassium 04/20/2014 4.0  3.7 - 5.3 mEq/L Final  . Chloride 04/20/2014 99  96 - 112 mEq/L Final  . CO2 04/20/2014 28  19 - 32 mEq/L Final  . Glucose, Bld 04/20/2014 117* 70 - 99 mg/dL Final  . BUN 04/20/2014 18  6 - 23 mg/dL Final  . Creatinine, Ser 04/20/2014 1.49* 0.50 - 1.10 mg/dL Final  . Calcium 04/20/2014 9.7  8.4 - 10.5 mg/dL Final  . GFR calc non Af Amer 04/20/2014 35* >90 mL/min Final  . GFR calc Af Amer 04/20/2014 41* >90 mL/min Final   Comment: (NOTE)  The eGFR has been calculated using the CKD EPI equation.                          This calculation has not been validated in all clinical situations.                          eGFR's persistently <90 mL/min signify possible Chronic Kidney                          Disease.  Infusion on 04/15/2014  Component Date Value Ref Range Status  . WBC 04/15/2014 6.0  4.0 - 10.5 K/uL Final  . RBC 04/15/2014 3.68* 3.87 - 5.11 MIL/uL Final  . Hemoglobin 04/15/2014 11.7* 12.0 - 15.0 g/dL Final  . HCT 04/15/2014 32.9* 36.0 - 46.0 % Final  . MCV 04/15/2014 89.4  78.0 - 100.0 fL Final  . MCH 04/15/2014 31.8  26.0 - 34.0 pg Final  . MCHC 04/15/2014 35.6  30.0 - 36.0 g/dL Final  . RDW 04/15/2014 16.0* 11.5 - 15.5 % Final  . Platelets 04/15/2014 292  150 - 400 K/uL Final  . Neutrophils Relative % 04/15/2014 67  43 - 77 % Final  . Neutro  Abs 04/15/2014 4.0  1.7 - 7.7 K/uL Final  . Lymphocytes Relative 04/15/2014 26  12 - 46 % Final  . Lymphs Abs 04/15/2014 1.5  0.7 - 4.0 K/uL Final  . Monocytes Relative 04/15/2014 5  3 - 12 % Final  . Monocytes Absolute 04/15/2014 0.3  0.1 - 1.0 K/uL Final  . Eosinophils Relative 04/15/2014 2  0 - 5 % Final  . Eosinophils Absolute 04/15/2014 0.1  0.0 - 0.7 K/uL Final  . Basophils Relative 04/15/2014 0  0 - 1 % Final  . Basophils Absolute 04/15/2014 0.0  0.0 - 0.1 K/uL Final  . Sodium 04/15/2014 136* 137 - 147 mEq/L Final  . Potassium 04/15/2014 4.1  3.7 - 5.3 mEq/L Final  . Chloride 04/15/2014 99  96 - 112 mEq/L Final  . CO2 04/15/2014 26  19 - 32 mEq/L Final  . Glucose, Bld 04/15/2014 132* 70 - 99 mg/dL Final  . BUN 04/15/2014 22  6 - 23 mg/dL Final  . Creatinine, Ser 04/15/2014 1.47* 0.50 - 1.10 mg/dL Final  . Calcium 04/15/2014 9.7  8.4 - 10.5 mg/dL Final  . Total Protein 04/15/2014 8.7* 6.0 - 8.3 g/dL Final  . Albumin 04/15/2014 3.3* 3.5 - 5.2 g/dL Final  . AST 04/15/2014 15  0 - 37 U/L Final  . ALT 04/15/2014 14  0 - 35 U/L Final  . Alkaline Phosphatase 04/15/2014 42  39 - 117 U/L Final  . Total Bilirubin 04/15/2014 0.4  0.3 - 1.2 mg/dL Final  . GFR calc non Af Amer 04/15/2014 36* >90 mL/min Final  . GFR calc Af Amer 04/15/2014 41* >90 mL/min Final   Comment: (NOTE)                          The eGFR has been calculated using the CKD EPI equation.                          This calculation has not been validated in all clinical situations.  eGFR's persistently <90 mL/min signify possible Chronic Kidney                          Disease.  Marland Kitchen LDH 04/15/2014 154  94 - 250 U/L Final  . Total Protein ELP 04/15/2014 8.1  6.0 - 8.3 g/dL Final  . IgG (Immunoglobin G), Serum 04/15/2014 2860* 690 - 1700 mg/dL Final  . IgA 04/15/2014 95  69 - 380 mg/dL Final  . IgM, Serum 04/15/2014 30* 52 - 322 mg/dL Final  . Immunofix Electr Int 04/15/2014 (NOTE)   Final   Comment:  Monoclonal IgG kappa protein is present.                          Reviewed by Odis Hollingshead, MD, PhD, FCAP (Electronic Signature on                          File)                          Performed at [facility]  . Ferritin 04/15/2014 92  10 - 291 ng/mL Final   Performed at [facility]  . Total Protein 04/15/2014 8.1  6.0 - 8.3 g/dL Final  . Albumin ELP 04/15/2014 44.8* 55.8 - 66.1 % Final  . Alpha-1-Globulin 04/15/2014 6.2* 2.9 - 4.9 % Final  . Alpha-2-Globulin 04/15/2014 10.2  7.1 - 11.8 % Final  . Beta Globulin 04/15/2014 4.9  4.7 - 7.2 % Final  . Beta 2 04/15/2014 2.7* 3.2 - 6.5 % Final  . Gamma Globulin 04/15/2014 31.2* 11.1 - 18.8 % Final  . M-Spike, % 04/15/2014 2.19   Final  . SPE Interp. 04/15/2014 (NOTE)   Final   Comment: A restricted band consistent with monoclonal protein is present.                          The monoclonal protein peak accounts for 2.19 g/dL of the total                          2.53 g/dL of protein in the gamma region.                          Reviewed by Odis Hollingshead, MD, PhD, FCAP (Electronic Signature on                          File)  . Comment 04/15/2014 (NOTE)   Final   Comment: ---------------                          Serum protein electrophoresis is a useful screening procedure in the                          detection of various pathophysiologic states such as inflammation,                          gammopathies, protein loss and other dysproteinemias.  Immunofixation  electrophoresis (IFE) is a more sensitive technique for the                          identification of M-proteins found in patients with monoclonal                          gammopathy of unknown significance (MGUS), amyloidosis, early or                          treated myeloma or macroglobulinemia, solitary plasmacytoma or                          extramedullary plasmacytoma.  . IgG (Immunoglobin G), Serum 04/15/2014 2990* 690 -  1700 mg/dL Final  . IgA 04/15/2014 100  69 - 380 mg/dL Final  . IgM, Serum 04/15/2014 31* 52 - 322 mg/dL Final  . Immunofix Electr Int 04/15/2014 (NOTE)   Final   Comment: Monoclonal IgG kappa protein is present.                          Reviewed by Odis Hollingshead, MD, PhD, FCAP (Electronic Signature on                          File)                          Performed at [facility]  . Kappa free light chain 04/15/2014 5.07* 0.33 - 1.94 mg/dL Final  . Lamda free light chains 04/15/2014 3.02* 0.57 - 2.63 mg/dL Final  . Kappa, lamda light chain ratio 04/15/2014 1.68* 0.26 - 1.65 Final   Performed at [facility] Visit on 04/15/2014  Component Date Value Ref Range Status  . Immunofixation, Urine 04/18/2014 (NOTE)   Final   Comment: Positive for monoclonal free Kappa light chains (Bence Jones protein).                          Reviewed by Odis Hollingshead, MD, PhD, FCAP (Electronic Signature on                          File)                          Performed at [facility]: No new pathology.  Urinalysis    Component Value Date/Time   COLORURINE YELLOW 04/20/2014 0743   APPEARANCEUR CLEAR 04/20/2014 0743   LABSPEC 1.025 04/20/2014 0743   PHURINE 5.5 04/20/2014 0743   GLUCOSEU NEGATIVE 04/20/2014 0743   GLUCOSEU NEG mg/dL 07/17/2009 2157   HGBUR NEGATIVE 04/20/2014 0743   HGBUR large 05/18/2010 1003   BILIRUBINUR NEGATIVE 04/20/2014 0743   BILIRUBINUR negative 08/02/2013 1516   KETONESUR NEGATIVE 04/20/2014 0743   PROTEINUR NEGATIVE 04/20/2014 0743   PROTEINUR negative 08/02/2013 1516   UROBILINOGEN 0.2 04/20/2014 0743   UROBILINOGEN 0.2 08/02/2013 1516   NITRITE NEGATIVE 04/20/2014 0743   NITRITE negative 08/02/2013 1516   LEUKOCYTESUR NEGATIVE 04/20/2014 0743    RADIOGRAPHIC STUDIES: Ct Abdomen Pelvis Wo Contrast  04/20/2014   CLINICAL DATA:  Left flank pain  EXAM: CT  ABDOMEN AND PELVIS WITHOUT CONTRAST  TECHNIQUE: Multidetector CT  imaging of the abdomen and pelvis was performed following the standard protocol without intravenous contrast.  COMPARISON:  MR ABDOMEN WO/W CM dated 03/24/2014  FINDINGS: There is bibasilar interstitial fibrosis.  No renal, ureteral, or bladder calculi. No obstructive uropathy. No perinephric stranding is seen. Prior right nephrectomy. The left kidney is normal. The bladder is unremarkable.  The liver demonstrates no focal abnormality. The gallbladder is unremarkable. The spleen demonstrates no focal abnormality. The adrenal glands and pancreas are normal.  The unopacified stomach, duodenum, small intestine and large intestine are unremarkable, but evaluation is limited by lack of oral contrast. There is a normal caliber appendix in the right lower quadrant without periappendiceal inflammatory changes. There is no pneumoperitoneum, pneumatosis, or portal venous gas. There is no abdominal or pelvic free fluid. There is no lymphadenopathy. In the uterus and ovaries are normal.  The abdominal aorta is normal in caliber with atherosclerosis.  There is no lytic or sclerotic osseous lesion. There is lower lumbar spine spondylosis most severe at L5-S1.  IMPRESSION: 1.  No urolithiasis or obstructive uropathy.  2.  No acute abdominal or pelvic pathology.  3. Prior right nephrectomy. No evidence of recurrent or residual malignancy in the surgical bed.   Electronically Signed   By: Kathreen Devoid   On: 04/20/2014 09:25   DG Bone Survey Met Status: Final result         PACS Images    Show images for DG Bone Survey Met         Study Result    CLINICAL DATA: Protein abnormality. Possible Milo month.  EXAM:  METASTATIC BONE SURVEY  COMPARISON: None.  FINDINGS:  Lateral skull: No lytic lesion. Sinuses are clear.  One view chest: Some left ventricular prominence. Mild unfolding of  the aorta up. Some scarring in both lung bases. On the lateral  thoracic few, density in the posterior costophrenic angle region    could be scarring or possibly mild infiltrate if there is clinical  evidence of pneumonia. No regional bony abnormality.  Cervical spine two view: No lytic or destructive lesions.  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 Spondylosis at C4-5, C5-6 and C6-7. Facet  arthropathy at C3-4. Posterior ligamentous calcification.  Thoracic spine two views: No lytic ear destructive lesions. Bridging  osteophytes in the region from T5 through T11 suggesting diffuse  idiopathic skeletal hyperostosis.  Lumbar spine two views: No lytic occur destructive lesions. Advanced  degenerative disc disease and degenerative facet disease in the  lower lumbar spine including anterolisthesis at L4-5 of 5 mm.  AP pelvis one view: No lytic lesions.  Bilateral hips: No lytic lesions.  Left tib-fib: No lytic lesions.  Right tib-fib: No lytic lesions.  Left shoulder one view: No lytic lesions.  Right shoulder one view: No lytic lesions.  Left humerus one view: No lytic lesions.  Right humerus one view: No lytic lesions.  Left forearm one view: No lytic lesions.  Right forearm one view: No lytic lesions.  IMPRESSION:  No lytic lesions.  Degenerative disease of the cervical, thoracic and lumbar spine as  outlined above.  Electronically Signed  By: Nelson Chimes M.D.  On: 04/29/2014 10:29     ASSESSMENT:  #1. 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 #2.History of right renal cell carcinoma, status post resection with no postop therapy, surgery performed in December of 2008.  #3. Hypertension, on  treatment.  #4. Height thyroidism, on treatment.  #5. Hyperlipidemia, on treatment.  #6. Depression, on treatment.  #7. Chronic obstructive pulmonary disease, on treatment    PLAN:  #1. Skeletal survey done today showed no evidence of lytic lesions so no additional intervention will be undertaken at this time.. #2. She was told to call  should any new symptoms occur that are troublesome and persistent. Planus followup in 3 months with lab tests.   All questions were answered. The patient knows to call the clinic with any problems, questions or concerns. We can certainly see the patient much sooner if necessary.   I spent 25 minutes counseling the patient face to face. The total time spent in the appointment was 30 minutes.    Farrel Gobble, MD 04/29/2014 11:08 AM  DISCLAIMER:  This note was dictated with voice recognition software.  Similar sounding words can inadvertently be transcribed inaccurately and may not be corrected upon review.

## 2014-04-29 NOTE — Telephone Encounter (Signed)
Message copied by Mellissa Kohut on Fri Apr 29, 2014 12:45 PM ------      Message from: Farrel Gobble A      Created: Fri Apr 29, 2014 11:30 AM       Please call Melissa [name] and tell her that her skeletal survey was normal and that she should just keep her appointment in 3 months. No bone marrow necessary at this time. Thanks ------

## 2014-04-29 NOTE — Telephone Encounter (Signed)
Patient notified

## 2014-04-29 NOTE — Patient Instructions (Signed)
[facility] Discharge Instructions  RECOMMENDATIONS MADE BY THE CONSULTANT AND ANY TEST RESULTS WILL BE SENT TO YOUR REFERRING PHYSICIAN.  EXAM FINDINGS BY THE PHYSICIAN TODAY AND SIGNS OR SYMPTOMS TO REPORT TO CLINIC OR PRIMARY PHYSICIAN: Exam and findings as discussed by Dr. Barnet Glasgow.  You do have slight elevation in your Bence Jones protein level.  We will do a skeletal survey today and if it shows anything we will get you scheduled for a Bone marrow biopsy and aspiration in [place].  We will call you if this needs to be done.  MEDICATIONS PRESCRIBED:  none  INSTRUCTIONS/FOLLOW-UP: Follow-up in 3 months with blood work and office visit.  Thank you for choosing [facility] to provide your oncology and hematology care.  To afford each patient quality time with our providers, please arrive at least 15 minutes before your scheduled appointment time.  With your help, our goal is to use those 15 minutes to complete the necessary work-up to ensure our physicians have the information they need to help with your evaluation and healthcare recommendations.    Effective January 1st, 2014, we ask that you re-schedule your appointment with our physicians should you arrive 10 or more minutes late for your appointment.  We strive to give you quality time with our providers, and arriving late affects you and other patients whose appointments are after yours.    Again, thank you for choosing [facility].  Our hope is that these requests will decrease the amount of time that you wait before being seen by our physicians.       _____________________________________________________________  Should you have questions after your visit to [facility], please contact our office at [phone] between the hours of 8:30 a.m. and 5:00 p.m.  Voicemails left after 4:30 p.m. will not be returned until the following business day.  For prescription refill  requests, have your pharmacy contact our office with your prescription refill request.

## 2014-05-02 ENCOUNTER — Ambulatory Visit (HOSPITAL_COMMUNITY)
Admission: RE | Admit: 2014-05-02 | Discharge: 2014-05-02 | Disposition: A | Discharge status: Home / Self Care | Payer: Medicare Other | Source: Ambulatory Visit | Attending: Family Medicine | Admitting: Family Medicine

## 2014-05-02 DIAGNOSIS — M5410 Radiculopathy, site unspecified: Secondary | ICD-10-CM

## 2014-05-02 DIAGNOSIS — R29898 Other symptoms and signs involving the musculoskeletal system: Secondary | ICD-10-CM

## 2014-05-02 NOTE — Progress Notes (Addendum)
Physical Therapy Treatment Patient Details  Name: Melissa [name] MRN: [id number] Date of Birth: 07/24/1946  Today's Date: 05/02/2014 Time: 1020-1100 PT Time Calculation (min): 40 min Charge:  There ex 1020-1100  Visit#: 3 of 8  Re-eval: 05/18/14    Authorization: [employer]  Subjective: Symptoms/Limitations Symptoms: Pt states she is doing some of the exercies at home but she has lost her sheet so she is going by memory Pain Assessment Currently in Pain?: Yes Pain Score: 4  Pain Location: Back  Exercise/Treatments   Aerobic Stationary Bike: NuStep x 10' L4 hills #3   Standing Heel Raises: 10 reps Functional Squats: 10 reps   Supine Ab Set: 10 reps;5 seconds Bent Knee Raise: 10 reps Bridge: 10 reps Prone  Single Arm Raise: 10 reps Straight Leg Raise: 10 reps Opposite Arm/Leg Raise: Right arm/Left leg;Left arm/Right leg;10 reps     Physical Therapy Assessment and Plan PT Assessment and Plan Clinical Impression Statement: Pt needed verbal cuing to contract core properly to maintain stabilization.  Reveiwed proper technique to get in and out of bed.  Pt given copy of her HEP so she can continut working on ex at home. Added additional prone exercise to strengthen extensor mm PT Plan: begin lunging activites next treatment     Goals  progressing-pain is steadily going down.  Problem List Patient Active Problem List   Diagnosis Date Noted  . 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 Radicular low back pain 04/18/2014  . Difficulty walking 04/18/2014  . Bilateral leg weakness 04/18/2014  . Positive ANA (antinuclear antibody) 03/22/2014  . Knee pain, right 03/22/2014  . Left groin pain 11/01/2013  . Lung nodules 08/02/2013  . Pulmonary fibrosis 03/29/2013  . Routine general medical examination at a health care facility 01/23/2013  . Anemia 08/13/2012  . Dyspnea on exertion 04/14/2012  . Hyperlipidemia   . Hypertension   . History of kidney cancer   . Chest pain   .  Vitiligo 04/01/2011  . HEARING LOSS 07/18/2010  . Depression 05/18/2010  . VITAMIN D DEFICIENCY 10/24/2009  . Hypothyroid 03/31/2008  . Obesity 03/31/2008  . VERTIGO, INTERMITTENT 03/31/2008    PT Plan of Care PT Home Exercise Plan: given new sheet   GP    Leeroy Cha 05/02/2014, 11:43 AM

## 2014-05-04 ENCOUNTER — Encounter: Payer: Self-pay | Admitting: Family Medicine

## 2014-05-04 ENCOUNTER — Ambulatory Visit (HOSPITAL_COMMUNITY)
Admission: RE | Admit: 2014-05-04 | Discharge: 2014-05-04 | Disposition: A | Discharge status: Home / Self Care | Payer: Medicare Other | Source: Ambulatory Visit | Attending: Family Medicine | Admitting: Family Medicine

## 2014-05-04 ENCOUNTER — Ambulatory Visit (INDEPENDENT_AMBULATORY_CARE_PROVIDER_SITE_OTHER): Payer: Medicare Other | Admitting: Family Medicine

## 2014-05-04 VITALS — BP 120/74 | HR 65 | Resp 16 | Wt 242.4 lb

## 2014-05-04 DIAGNOSIS — I10 Essential (primary) hypertension: Secondary | ICD-10-CM

## 2014-05-04 DIAGNOSIS — R29898 Other symptoms and signs involving the musculoskeletal system: Secondary | ICD-10-CM

## 2014-05-04 DIAGNOSIS — E785 Hyperlipidemia, unspecified: Secondary | ICD-10-CM

## 2014-05-04 DIAGNOSIS — R809 Proteinuria, unspecified: Secondary | ICD-10-CM

## 2014-05-04 DIAGNOSIS — F329 Major depressive disorder, single episode, unspecified: Secondary | ICD-10-CM

## 2014-05-04 DIAGNOSIS — Z85528 Personal history of other malignant neoplasm of kidney: Secondary | ICD-10-CM

## 2014-05-04 DIAGNOSIS — N3090 Cystitis, unspecified without hematuria: Secondary | ICD-10-CM

## 2014-05-04 DIAGNOSIS — M5410 Radiculopathy, site unspecified: Secondary | ICD-10-CM

## 2014-05-04 DIAGNOSIS — E039 Hypothyroidism, unspecified: Secondary | ICD-10-CM

## 2014-05-04 DIAGNOSIS — R803 Bence Jones proteinuria: Secondary | ICD-10-CM

## 2014-05-04 DIAGNOSIS — F3289 Other specified depressive episodes: Secondary | ICD-10-CM

## 2014-05-04 DIAGNOSIS — F32A Depression, unspecified: Secondary | ICD-10-CM

## 2014-05-04 DIAGNOSIS — J8410 Pulmonary fibrosis, unspecified: Secondary | ICD-10-CM

## 2014-05-04 DIAGNOSIS — IMO0002 Reserved for concepts with insufficient information to code with codable children: Secondary | ICD-10-CM

## 2014-05-04 DIAGNOSIS — R7301 Impaired fasting glucose: Secondary | ICD-10-CM

## 2014-05-04 LAB — POCT URINALYSIS DIPSTICK
BILIRUBIN UA: NEGATIVE
Blood, UA: NEGATIVE
GLUCOSE UA: NEGATIVE
Ketones, UA: NEGATIVE
Leukocytes, UA: NEGATIVE
NITRITE UA: NEGATIVE
Protein, UA: NEGATIVE
Spec Grav, UA: 1.02
UROBILINOGEN UA: 0.2
pH, UA: 5.5

## 2014-05-04 NOTE — Progress Notes (Signed)
Physical Therapy Treatment Patient Details  Name: Melissa [name] MRN: [id number] Date of Birth: [birth year]  Today's Date: 05/04/2014 Time: 1015-1050 PT Time Calculation (min): 35 min Charge: TE 1015-1050  Visit#: 4 of 8  Re-eval:   Assessment Diagnosis: Low back pain with radiculopathy Next MD Visit: Moshe Cipro 05/04/2014  Authorization: [employer]  Authorization Time Period:    Authorization Visit#:   of     Subjective: Symptoms/Limitations Symptoms: Pt stated she is doing better today, pain scale 3/10 Rt knee.  Pt c/o knee "popping" during gait. Pain Assessment Currently in Pain?: Yes Pain Score: 3  Pain Location: Knee Pain Orientation: Right  Objective:   Exercise/Treatments Aerobic Stationary Bike: NuStep x 10' L4 hills #3 Standing Heel Raises: 15 reps;Limitations Heel Raises Limitations: Toe raises 15x Functional Squats: 10 reps;Limitations Functional Squats Limitations: 3D hip excursion Forward Lunge: 10 reps;Limitations Forward Lunge Limitations: Bil LE     Physical Therapy Assessment and Plan PT Assessment and Plan Clinical Impression Statement: Progressed standing exercises to forward lunges for LE strengthening, multimodal cueing to improve posture and reduce ER with standing exercises.  Pt with MD apt at 11:00, limited by time for supine and prone exercises.   PT Plan: Continue with current POC, resume supine and prone exercises.    Goals Home Exercise Program Pt/caregiver will Perform Home Exercise Program: For increased strengthening PT Short Term Goals Time to Complete Short Term Goals: 2 weeks PT Short Term Goal 1: Pain no greater than a 5/10 80% of the day PT Short Term Goal 1 - Progress: Progressing toward goal PT Short Term Goal 2: Pt able to walk for 10 minutes in good posture. PT Short Term Goal 2 - Progress: Progressing toward goal PT Short Term Goal 3: Pt to be able to stand for 15 mintues to make a small meal PT Short Term Goal 3 - Progress:  Progressing toward goal PT Long Term Goals Time to Complete Long Term Goals: 4 weeks PT Long Term Goal 1: I in advance HEP PT Long Term Goal 2: pt to be able to walk for 30  minutes at 3 times a week for good health habits Long Term Goal 3: Pain no greater than a 3/10  Long Term Goal 4: pt to be able to stand for 30 minutes to make a meal  PT Long Term Goal 5: able to go up and down steps so she can visit her daughter.   Problem List Patient Active Problem List   Diagnosis Date Noted  . 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 Radicular low back pain 04/18/2014  . Difficulty walking 04/18/2014  . Bilateral leg weakness 04/18/2014  . Positive ANA (antinuclear antibody) 03/22/2014  . Knee pain, right 03/22/2014  . Left groin pain 11/01/2013  . Lung nodules 08/02/2013  . Pulmonary fibrosis 03/29/2013  . Routine general medical examination at a health care facility 01/23/2013  . Anemia 08/13/2012  . Dyspnea on exertion 04/14/2012  . Hyperlipidemia   . Hypertension   . History of kidney cancer   . Chest pain   . Vitiligo 04/01/2011  . HEARING LOSS 07/18/2010  . Depression 05/18/2010  . VITAMIN D DEFICIENCY 10/24/2009  . Hypothyroid 03/31/2008  . Obesity 03/31/2008  . VERTIGO, INTERMITTENT 03/31/2008    PT - End of Session Activity Tolerance: Patient tolerated treatment well General Behavior During Therapy: [facility] for tasks assessed/performed  GP    Aldona Lento 05/04/2014, 11:05 AM

## 2014-05-04 NOTE — Patient Instructions (Addendum)
Annual wellness in 2.5 month, call if you need me before  Fasting lipid, cmp, HBA1c and TSh  In 2.5 month  You will need to reschedule your colonoscopy, you will call to get this done, I would suggest you contact your ins company to see if a nurse can help you prepare for next colonoscopy.   Keep doing therappy so you do not fall and get stronger

## 2014-05-06 ENCOUNTER — Other Ambulatory Visit (HOSPITAL_COMMUNITY): Payer: Self-pay | Admitting: Pulmonary Disease

## 2014-05-06 DIAGNOSIS — J8410 Pulmonary fibrosis, unspecified: Secondary | ICD-10-CM

## 2014-05-08 DIAGNOSIS — N3090 Cystitis, unspecified without hematuria: Secondary | ICD-10-CM | POA: Insufficient documentation

## 2014-05-08 NOTE — Assessment & Plan Note (Signed)
Improved and controlled on current med , continue same 

## 2014-05-08 NOTE — Assessment & Plan Note (Signed)
Currently in PT and gaining strength and improved safe mobility

## 2014-05-08 NOTE — Progress Notes (Signed)
Subjective:    Patient ID: Melissa [name], female    DOB: 12/07/1946, 68 y.o.   MRN: [id number]  HPI The PT is here for follow up and re-evaluation of chronic medical conditions, medication management and review of any available recent lab and radiology data.  Preventive health is updated, specifically  Cancer screening and Immunization.   Recent attempt at colonoscopy was unsuccessful due to high stool burden, pt will need to reschedule and ensure her prep is good, I suggest she get help either from home health ins nurse /company or her daughter. She recently had heme/onc eval re Bence Jones protein and still has upcoming eval by rheumatology re abn ANA Currently in pT for back pain and unsteady gait, feels as though she is benefiting The PT denies any adverse reactions to current medications since the last visit.        Review of Systems See HPI Denies recent fever or chills. Denies sinus pressure, nasal congestion, ear pain or sore throat. Denies chest congestion, productive cough or wheezing.States breathing improved with use of inhalers Denies chest pains, palpitations and leg swelling Denies abdominal pain, nausea, vomiting,diarrhea or constipation.   Denies dysuria,  C/o frequency, and flank pain for 2 month Denies headaches, seizures, numbness, or tingling. Denies uncontrolled  depression, anxiety or insomnia. Denies skin break down or rash.        Objective:   Physical Exam BP 120/74  Pulse 65  Resp 16  Wt 242 lb 6.4 oz (109.952 kg)  SpO2 99% Patient alert and oriented and in no cardiopulmonary distress.  HEENT: No facial asymmetry, EOMI, no sinus tenderness,  oropharynx pink and moist.  Neck supple no adenopathy.  Chest: Clear to auscultation bilaterally.  CVS: S1, S2 no murmurs, no S3.  ABD: Soft non tender.   Ext: No edema  MS: decreased  ROM spine, shoulders, hips and knees.  Skin: Intact, no ulcerations or rash noted.  Psych: Good eye  contact, normal affect. Memory intact mildly  anxious not  depressed appearing.  CNS: CN 2-12 intact, power, tone and sensation normal throughout.        Assessment & Plan:  Hypertension Controlled, no change in medication DASH diet and commitment to daily physical activity for a minimum of 30 minutes discussed and encouraged, as a part of hypertension management. The importance of attaining a healthy weight is also discussed.   Pulmonary fibrosis Improved dyspnea with use of chronic inhalers  Hypothyroid Controlled, no change in medication   Bilateral leg weakness Currently in PT and gaining strength and improved safe mobility  Depression Improved and controlled on current med, continue same  Radicular low back pain Ongoing c/o left flank pain, has established disc  Disease and stenosis and I explain that this is the cause of her symptoms. Hopefully PT will lprovide some relief. Epidural injection is an option, but holding off at this time  Bence Jones proteinuria Workup by heme/onc negative to present, has rept testing due in the next several months, this is reviewed with the pt at this visit  History of kidney cancer No evidence of recurrence, recently re evaluated by onchology  Hyperlipidemia Elevated TG, low hDL, counseled re appropriate changes which need to be made   Cystitis Frequency with flank pain, Ua is negative for any abnormality, pt reassured

## 2014-05-08 NOTE — Assessment & Plan Note (Signed)
Frequency with flank pain, Ua is negative for any abnormality, pt reassured

## 2014-05-08 NOTE — Assessment & Plan Note (Signed)
Improved dyspnea with use of chronic inhalers

## 2014-05-08 NOTE — Assessment & Plan Note (Signed)
Workup by heme/onc negative to present, has rept testing due in the next several months, this is reviewed with the pt at this visit

## 2014-05-08 NOTE — Assessment & Plan Note (Signed)
Controlled, no change in medication DASH diet and commitment to daily physical activity for a minimum of 30 minutes discussed and encouraged, as a part of hypertension management. The importance of attaining a healthy weight is also discussed.  

## 2014-05-08 NOTE — Assessment & Plan Note (Signed)
Ongoing c/o left flank pain, has established disc  Disease and stenosis and I explain that this is the cause of her symptoms. Hopefully PT will lprovide some relief. Epidural injection is an option, but holding off at this time

## 2014-05-08 NOTE — Assessment & Plan Note (Signed)
Elevated TG, low hDL, counseled re appropriate changes which need to be made

## 2014-05-08 NOTE — Assessment & Plan Note (Signed)
No evidence of recurrence, recently re evaluated by onchology

## 2014-05-08 NOTE — Assessment & Plan Note (Signed)
Controlled, no change in medication  

## 2014-05-09 ENCOUNTER — Ambulatory Visit (HOSPITAL_COMMUNITY)
Admission: RE | Admit: 2014-05-09 | Discharge: 2014-05-09 | Disposition: A | Discharge status: Home / Self Care | Payer: Medicare Other | Source: Ambulatory Visit | Attending: Family Medicine | Admitting: Family Medicine

## 2014-05-09 NOTE — Progress Notes (Signed)
Physical Therapy Treatment Patient Details  Name: Melissa [name] MRN: [id number] Date of Birth: 1945/12/26  Today's Date: 05/09/2014 Time: 1017-1100 PT Time Calculation (min): 43 min Charges: Therex x 38'  Visit#: 5 of 8  Re-eval: 05/18/14  Authorization: [employer]    Subjective: Symptoms/Limitations Symptoms: Pt states that her pain is feeling much better. Pain Assessment Currently in Pain?: Yes Pain Score: 5  Pain Location: Knee Pain Orientation: Right  Exercise/Treatments Stretches Passive Hamstring Stretch: 3 reps;10 seconds;Limitations Passive Hamstring Stretch Limitations: 3 directional Aerobic Stationary Bike: NuStep x 10' L4 hills #3 to improve strength and activity tolerance Standing Heel Raises: 15 reps;Limitations Heel Raises Limitations: Toe raises 15x Functional Squats: 10 reps;Limitations Functional Squats Limitations: 3D hip excursion Forward Lunge: 10 reps;Limitations Forward Lunge Limitations: Bil LE Scapular Retraction: 10 reps;Theraband Theraband Level (Scapular Retraction): Level 3 (Green) Row: 10 reps;Theraband Theraband Level (Row): Level 3 (Green) Shoulder Extension: 10 reps;Theraband Theraband Level (Shoulder Extension): Level 3 (Green)  Physical Therapy Assessment and Plan PT Assessment and Plan Clinical Impression Statement: PTA facilitated activities to improve posture, core/LE strength and flexibility. Pt completes therex well after initial cueing and demo. Began hamstring stretch to improve LE mobility. Pt reports decreased pain at end of session. PT Plan: Continue to progress posture/LE strength and trunk/LE mobility per PT POC. Resume core stabilization exercises next session.    Problem List Patient Active Problem List   Diagnosis Date Noted  . Cystitis 05/08/2014  . 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 Radicular low back pain 04/18/2014  . Difficulty walking 04/18/2014  . Bilateral leg weakness 04/18/2014  . Positive ANA  (antinuclear antibody) 03/22/2014  . Knee pain, right 03/22/2014  . Left groin pain 11/01/2013  . Lung nodules 08/02/2013  . Pulmonary fibrosis 03/29/2013  . Routine general medical examination at a health care facility 01/23/2013  . Anemia 08/13/2012  . Dyspnea on exertion 04/14/2012  . Hyperlipidemia   . Hypertension   . History of kidney cancer   . Chest pain   . Vitiligo 04/01/2011  . HEARING LOSS 07/18/2010  . Depression 05/18/2010  . VITAMIN D DEFICIENCY 10/24/2009  . Hypothyroid 03/31/2008  . Obesity 03/31/2008  . VERTIGO, INTERMITTENT 03/31/2008    PT - End of Session Activity Tolerance: Patient tolerated treatment well General Behavior During Therapy: [facility] for tasks assessed/performed  Rachelle Hora, PTA 05/09/2014, 11:04 AM

## 2014-05-10 ENCOUNTER — Ambulatory Visit (HOSPITAL_COMMUNITY)
Admission: RE | Admit: 2014-05-10 | Discharge: 2014-05-10 | Disposition: A | Discharge status: Home / Self Care | Payer: Medicare Other | Source: Ambulatory Visit | Attending: Pulmonary Disease | Admitting: Pulmonary Disease

## 2014-05-10 DIAGNOSIS — J8410 Pulmonary fibrosis, unspecified: Secondary | ICD-10-CM | POA: Insufficient documentation

## 2014-05-10 MED ORDER — ALBUTEROL SULFATE (2.5 MG/3ML) 0.083% IN NEBU
2.5000 mg | INHALATION_SOLUTION | Freq: Once | RESPIRATORY_TRACT | Status: AC
  Administered 2014-05-10: 2.5 mg via RESPIRATORY_TRACT

## 2014-05-11 ENCOUNTER — Ambulatory Visit (HOSPITAL_COMMUNITY)
Admission: RE | Admit: 2014-05-11 | Discharge: 2014-05-11 | Disposition: A | Discharge status: Home / Self Care | Payer: Medicare Other | Source: Ambulatory Visit | Attending: Pulmonary Disease | Admitting: Pulmonary Disease

## 2014-05-11 ENCOUNTER — Ambulatory Visit (HOSPITAL_COMMUNITY)
Admission: RE | Admit: 2014-05-11 | Discharge: 2014-05-11 | Disposition: A | Discharge status: Home / Self Care | Payer: Medicare Other | Source: Ambulatory Visit | Attending: Family Medicine | Admitting: Family Medicine

## 2014-05-11 DIAGNOSIS — I2510 Atherosclerotic heart disease of native coronary artery without angina pectoris: Secondary | ICD-10-CM | POA: Insufficient documentation

## 2014-05-11 DIAGNOSIS — R911 Solitary pulmonary nodule: Secondary | ICD-10-CM | POA: Insufficient documentation

## 2014-05-11 DIAGNOSIS — J8410 Pulmonary fibrosis, unspecified: Secondary | ICD-10-CM

## 2014-05-11 LAB — PULMONARY FUNCTION TEST
DL/VA % PRED: 95 %
DL/VA: 4.7 ml/min/mmHg/L
DLCO cor % pred: 49 %
DLCO cor: 12.8 ml/min/mmHg
DLCO unc % pred: 49 %
DLCO unc: 12.8 ml/min/mmHg
FEF 25-75 POST: 1.24 L/s
FEF 25-75 Pre: 1.24 L/sec
FEF2575-%Change-Post: 0 %
FEF2575-%Pred-Post: 67 %
FEF2575-%Pred-Pre: 67 %
FEV1-%CHANGE-POST: 0 %
FEV1-%PRED-PRE: 65 %
FEV1-%Pred-Post: 64 %
FEV1-Post: 1.29 L
FEV1-Pre: 1.29 L
FEV1FVC-%CHANGE-POST: -1 %
FEV1FVC-%Pred-Pre: 105 %
FEV6-%CHANGE-POST: 0 %
FEV6-%PRED-PRE: 63 %
FEV6-%Pred-Post: 64 %
FEV6-PRE: 1.57 L
FEV6-Post: 1.58 L
FEV6FVC-%Pred-Post: 104 %
FEV6FVC-%Pred-Pre: 104 %
FVC-%Change-Post: 0 %
FVC-%PRED-POST: 61 %
FVC-%Pred-Pre: 61 %
FVC-Post: 1.58 L
FVC-Pre: 1.57 L
POST FEV1/FVC RATIO: 81 %
Post FEV6/FVC ratio: 100 %
Pre FEV1/FVC ratio: 82 %
Pre FEV6/FVC Ratio: 100 %
RV % PRED: 82 %
RV: 1.81 L
TLC % pred: 63 %
TLC: 3.32 L

## 2014-05-11 NOTE — Progress Notes (Signed)
Physical Therapy Treatment Patient Details  Name: Melissa [name] MRN: [id number] Date of Birth: 07-27-1946  Today's Date: 05/11/2014 Time: 1015-1100 PT Time Calculation (min): 45 min Charges: Therex x 42'  Visit#: 6 of 8  Re-eval: 05/18/14  Authorization: [employer]   Subjective: Symptoms/Limitations Symptoms: Pt reports HEP compliance. Pain Assessment Currently in Pain?: Yes Pain Score: 2  Pain Location: Knee Pain Orientation: Right  Exercise/Treatments Aerobic Stationary Bike: NuStep x 10' L4 hills #3 to improve strength and activity tolerance Standing Heel Raises: 15 reps;Limitations Heel Raises Limitations: Toe raises 15x Functional Squats: 10 reps;Limitations Functional Squats Limitations: 3D hip excursion Forward Lunge: 10 reps;Limitations Forward Lunge Limitations: Bil LE Scapular Retraction: 10 reps;Theraband Theraband Level (Scapular Retraction): Level 3 (Green) Row: 10 reps;Theraband Theraband Level (Row): Level 3 (Green) Shoulder Extension: 10 reps;Theraband Theraband Level (Shoulder Extension): Level 3 (Green) Supine Ab Set: 10 reps;5 seconds Bridge: 10 reps;5 seconds Straight Leg Raise: 10 reps Sidelying Hip Abduction: 10 reps  Physical Therapy Assessment and Plan PT Assessment and Plan Clinical Impression Statement: PTA facilitated activities to improve posture, core/LE strength and flexibility. Pt requires multimodal cueing to improve form with functional squats and scapular strengthening exercises. Pt completes stabilization exercise on mat with improve form and decreased need for cueing.  PT Plan: Continue to progress posture/LE strength and trunk/LE mobility per PT POC.    Problem List Patient Active Problem List   Diagnosis Date Noted  . Cystitis 05/08/2014  . 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 Radicular low back pain 04/18/2014  . Difficulty walking 04/18/2014  . Bilateral leg weakness 04/18/2014  . Positive ANA (antinuclear antibody)  03/22/2014  . Knee pain, right 03/22/2014  . Left groin pain 11/01/2013  . Lung nodules 08/02/2013  . Pulmonary fibrosis 03/29/2013  . Routine general medical examination at a health care facility 01/23/2013  . Anemia 08/13/2012  . Dyspnea on exertion 04/14/2012  . Hyperlipidemia   . Hypertension   . History of kidney cancer   . Chest pain   . Vitiligo 04/01/2011  . HEARING LOSS 07/18/2010  . Depression 05/18/2010  . VITAMIN D DEFICIENCY 10/24/2009  . Hypothyroid 03/31/2008  . Obesity 03/31/2008  . VERTIGO, INTERMITTENT 03/31/2008    PT - End of Session Activity Tolerance: Patient tolerated treatment well General Behavior During Therapy: [facility] for tasks assessed/performed  Rachelle Hora, PTA 05/11/2014, 12:06 PM

## 2014-05-15 NOTE — Assessment & Plan Note (Addendum)
uncontrolled back pain with lower extremity weakness and instability.  , anti inflammatories at visit also prednisone dose pack prescribed, also referred for PT x 4 weeks

## 2014-05-15 NOTE — Assessment & Plan Note (Signed)
Controlled, no change in medication  

## 2014-05-15 NOTE — Assessment & Plan Note (Signed)
Clinically stable and followed by pulmonary

## 2014-05-15 NOTE — Assessment & Plan Note (Signed)
Deterioration in pain with instability, xray of knee , refer ortho, walking cane prescribed

## 2014-05-15 NOTE — Assessment & Plan Note (Signed)
Pt with elevated ANA and proteinuria, refer to rheumatology for eval and treatment

## 2014-05-15 NOTE — Assessment & Plan Note (Signed)
deteriorated Reduce fatr intake , no med change Hyperlipidemia:Low fat diet discussed and encouraged.

## 2014-05-17 ENCOUNTER — Inpatient Hospital Stay (HOSPITAL_COMMUNITY): Admission: RE | Admit: 2014-05-17 | Payer: Medicare Other | Source: Ambulatory Visit | Admitting: Physical Therapy

## 2014-05-29 ENCOUNTER — Other Ambulatory Visit: Payer: Self-pay | Admitting: Family Medicine

## 2014-06-17 ENCOUNTER — Telehealth: Payer: Self-pay

## 2014-06-17 NOTE — Telephone Encounter (Signed)
Patient walked in the office complaining on very sharp pains in her right side. States something sharp is sticking her, hurt to breathe x 3 days. She stated she could not wait until Monday to be seen. I advised her we did not have a dr here and she would need to be evaluated at the urgent care. Since our local urgent care did not take her insurance, we called to the urgent care in [place] (which is where she lives) and they did accept her insurance. Advised her to go there. Patient then wanted her blood pressure checked and again I advised her that I could not perform any patient care without a Dr present. I could tell she was frustrated but I told her I would enter the information in the system and what I advised her to do.

## 2014-07-07 ENCOUNTER — Other Ambulatory Visit: Payer: Self-pay | Admitting: Family Medicine

## 2014-07-18 ENCOUNTER — Other Ambulatory Visit: Payer: Self-pay | Admitting: Family Medicine

## 2014-07-22 ENCOUNTER — Encounter (HOSPITAL_COMMUNITY): Payer: Medicare Other | Attending: Hematology and Oncology

## 2014-07-22 DIAGNOSIS — R809 Proteinuria, unspecified: Secondary | ICD-10-CM

## 2014-07-22 DIAGNOSIS — Z85528 Personal history of other malignant neoplasm of kidney: Secondary | ICD-10-CM | POA: Diagnosis present

## 2014-07-22 DIAGNOSIS — D892 Hypergammaglobulinemia, unspecified: Secondary | ICD-10-CM | POA: Diagnosis present

## 2014-07-22 LAB — COMPREHENSIVE METABOLIC PANEL
ALT: 12 U/L (ref 0–35)
ALT: 12 U/L (ref 0–35)
AST: 20 U/L (ref 0–37)
AST: 14 U/L (ref 0–37)
Albumin: 4.2 g/dL (ref 3.5–5.2)
Albumin: 4 g/dL (ref 3.5–5.2)
Alkaline Phosphatase: 36 U/L — ABNORMAL LOW (ref 39–117)
Alkaline Phosphatase: 43 U/L (ref 39–117)
Anion gap: 12 (ref 5–15)
BUN: 24 mg/dL — ABNORMAL HIGH (ref 6–23)
BUN: 29 mg/dL — ABNORMAL HIGH (ref 6–23)
CALCIUM: 10.1 mg/dL (ref 8.4–10.5)
CHLORIDE: 98 meq/L (ref 96–112)
CO2: 24 meq/L (ref 19–32)
CO2: 27 mEq/L (ref 19–32)
Calcium: 9.9 mg/dL (ref 8.4–10.5)
Chloride: 98 mEq/L (ref 96–112)
Creat: 1.91 mg/dL — ABNORMAL HIGH (ref 0.50–1.10)
Creatinine, Ser: 2.23 mg/dL — ABNORMAL HIGH (ref 0.50–1.10)
GFR calc Af Amer: 25 mL/min — ABNORMAL LOW (ref >90)
GFR calc non Af Amer: 21 mL/min — ABNORMAL LOW (ref >90)
Glucose, Bld: 85 mg/dL (ref 70–99)
Glucose, Bld: 103 mg/dL — ABNORMAL HIGH (ref 70–99)
Potassium: 4.9 mEq/L (ref 3.5–5.3)
Potassium: 4.6 mEq/L (ref 3.7–5.3)
SODIUM: 134 meq/L — AB (ref 135–145)
SODIUM: 137 meq/L (ref 137–147)
TOTAL PROTEIN: 9.4 g/dL — AB (ref 6.0–8.3)
TOTAL PROTEIN: 9.7 g/dL — AB (ref 6.0–8.3)
Total Bilirubin: 0.5 mg/dL (ref 0.2–1.2)
Total Bilirubin: 0.5 mg/dL (ref 0.3–1.2)

## 2014-07-22 LAB — CBC WITH DIFFERENTIAL/PLATELET
Basophils Absolute: 0 10*3/uL (ref 0.0–0.1)
Basophils Relative: 0 % (ref 0–1)
EOS ABS: 0.1 10*3/uL (ref 0.0–0.7)
Eosinophils Relative: 1 % (ref 0–5)
HCT: 34.2 % — ABNORMAL LOW (ref 36.0–46.0)
Hemoglobin: 12 g/dL (ref 12.0–15.0)
Lymphocytes Relative: 31 % (ref 12–46)
Lymphs Abs: 1.9 10*3/uL (ref 0.7–4.0)
MCH: 31.7 pg (ref 26.0–34.0)
MCHC: 35.1 g/dL (ref 30.0–36.0)
MCV: 90.5 fL (ref 78.0–100.0)
Monocytes Absolute: 0.6 10*3/uL (ref 0.1–1.0)
Monocytes Relative: 10 % (ref 3–12)
NEUTROS ABS: 3.5 10*3/uL (ref 1.7–7.7)
NEUTROS PCT: 58 % (ref 43–77)
PLATELETS: 327 10*3/uL (ref 150–400)
RBC: 3.78 MIL/uL — AB (ref 3.87–5.11)
RDW: 16.3 % — ABNORMAL HIGH (ref 11.5–15.5)
WBC: 6 10*3/uL (ref 4.0–10.5)

## 2014-07-22 LAB — HEMOGLOBIN A1C
Hgb A1c MFr Bld: 6 % — ABNORMAL HIGH (ref <5.7)
MEAN PLASMA GLUCOSE: 126 mg/dL — AB (ref <117)

## 2014-07-22 LAB — LIPID PANEL
CHOLESTEROL: 147 mg/dL (ref 0–200)
HDL: 33 mg/dL — ABNORMAL LOW (ref >39)
LDL Cholesterol: 64 mg/dL (ref 0–99)
Total CHOL/HDL Ratio: 4.5 Ratio
Triglycerides: 248 mg/dL — ABNORMAL HIGH (ref <150)
VLDL: 50 mg/dL — ABNORMAL HIGH (ref 0–40)

## 2014-07-22 LAB — LACTATE DEHYDROGENASE: LDH: 134 U/L (ref 94–250)

## 2014-07-22 LAB — TSH: TSH: 1.154 u[IU]/mL (ref 0.350–4.500)

## 2014-07-22 NOTE — Progress Notes (Signed)
LABS DRAWN FOR MM,KLLC,B2M,LDH,SIFX,CBCD,CMP.

## 2014-07-25 LAB — KAPPA/LAMBDA LIGHT CHAINS
KAPPA FREE LGHT CHN: 7.55 mg/dL — AB (ref 0.33–1.94)
Kappa, lambda light chain ratio: 2.2 — ABNORMAL HIGH (ref 0.26–1.65)
Lambda free light chains: 3.43 mg/dL — ABNORMAL HIGH (ref 0.57–2.63)

## 2014-07-25 LAB — BETA 2 MICROGLOBULIN, SERUM: BETA 2 MICROGLOBULIN: 12.5 mg/L — AB (ref <=2.51)

## 2014-07-26 ENCOUNTER — Encounter: Payer: Medicare Other | Admitting: Family Medicine

## 2014-07-26 LAB — MULTIPLE MYELOMA PANEL, SERUM
ALPHA-2-GLOBULIN: 9.3 % (ref 7.1–11.8)
Albumin ELP: 46.4 % — ABNORMAL LOW (ref 55.8–66.1)
Alpha-1-Globulin: 4.2 % (ref 2.9–4.9)
Beta 2: 3 % — ABNORMAL LOW (ref 3.2–6.5)
Beta Globulin: 4.9 % (ref 4.7–7.2)
GAMMA GLOBULIN: 32.2 % — AB (ref 11.1–18.8)
IgA: 101 mg/dL (ref 69–380)
IgG (Immunoglobin G), Serum: 3600 mg/dL — ABNORMAL HIGH (ref 690–1700)
IgM, Serum: 29 mg/dL — ABNORMAL LOW (ref 52–322)
M-SPIKE, %: 2.43 g/dL
Total Protein: 8.9 g/dL — ABNORMAL HIGH (ref 6.0–8.3)

## 2014-07-26 LAB — IMMUNOFIXATION ELECTROPHORESIS
IGA: 98 mg/dL (ref 69–380)
IGG (IMMUNOGLOBIN G), SERUM: 3100 mg/dL — AB (ref 690–1700)
IGM, SERUM: 29 mg/dL — AB (ref 52–322)
Total Protein ELP: 9 g/dL — ABNORMAL HIGH (ref 6.0–8.3)

## 2014-07-29 ENCOUNTER — Ambulatory Visit (HOSPITAL_COMMUNITY): Payer: Medicare Other

## 2014-08-01 NOTE — Progress Notes (Signed)
This encounter was created in error - please disregard.

## 2014-08-05 ENCOUNTER — Encounter (HOSPITAL_COMMUNITY): Payer: Medicare Other | Attending: Hematology and Oncology

## 2014-08-05 ENCOUNTER — Encounter (HOSPITAL_COMMUNITY): Payer: Self-pay

## 2014-08-05 VITALS — BP 167/68 | HR 67 | Temp 98.3°F | Resp 18 | Wt 244.2 lb

## 2014-08-05 DIAGNOSIS — D892 Hypergammaglobulinemia, unspecified: Secondary | ICD-10-CM | POA: Insufficient documentation

## 2014-08-05 DIAGNOSIS — R809 Proteinuria, unspecified: Secondary | ICD-10-CM | POA: Insufficient documentation

## 2014-08-05 DIAGNOSIS — Z85528 Personal history of other malignant neoplasm of kidney: Secondary | ICD-10-CM

## 2014-08-05 DIAGNOSIS — R803 Bence Jones proteinuria: Secondary | ICD-10-CM

## 2014-08-05 NOTE — Patient Instructions (Signed)
..[facility] Discharge Instructions  RECOMMENDATIONS MADE BY THE CONSULTANT AND ANY TEST RESULTS WILL BE SENT TO YOUR REFERRING PHYSICIAN.  EXAM FINDINGS BY THE PHYSICIAN TODAY AND SIGNS OR SYMPTOMS TO REPORT TO CLINIC OR PRIMARY PHYSICIAN: Exam and findings as discussed by Dr. 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 We also need to collect a 24 hr urine specimen INSTRUCTIONS/FOLLOW-UP: 3 weeks to see [place]  Thank you for choosing [facility] to provide your oncology and hematology care.  To afford each patient quality time with our providers, please arrive at least 15 minutes before your scheduled appointment time.  With your help, our goal is to use those 15 minutes to complete the necessary work-up to ensure our physicians have the information they need to help with your evaluation and healthcare recommendations.    Effective January 1st, 2014, we ask that you re-schedule your appointment with our physicians should you arrive 10 or more minutes late for your appointment.  We strive to give you quality time with our providers, and arriving late affects you and other patients whose appointments are after yours.    Again, thank you for choosing [facility].  Our hope is that these requests will decrease the amount of time that you wait before being seen by our physicians.       _____________________________________________________________  Should you have questions after your visit to [facility], please contact our office at [phone] between the hours of 8:30 a.m. and 4:30 p.m.  Voicemails left after 4:30 p.m. will not be returned until the following business day.  For prescription refill requests,  have your pharmacy contact our office with your prescription refill request.    _______________________________________________________________  We hope that we have given you very good care.  You may receive a patient satisfaction survey in the mail, please complete it and return it as soon as possible.  We value your feedback!  _______________________________________________________________  Have you asked about our STAR program?  STAR stands for Survivorship Training and Rehabilitation, and this is a nationally recognized cancer care program that focuses on survivorship and rehabilitation.  Cancer and cancer treatments may cause problems, such as, pain, making you feel tired and keeping you from doing the things that you need or want to do. Cancer rehabilitation can help. Our goal is to reduce these troubling effects and help you have the best quality of life possible.  You may receive a survey from a nurse that asks questions about your current state of health.  Based on the survey results, all eligible patients will be referred to the [facility] program for an evaluation so we can better serve you!  A frequently asked questions sheet is available upon request.  24-Hour Urine Collection HOME CARE  When you get up in the morning on the day you do this test, pee (urinate) in the toilet and flush. Make a note of the time. This will be your start time on the day of collection and the end time on the next morning.  From then on, save all your pee (urine) in the plastic jug that was given to you.  You should stop collecting your pee 24 hours after  you started.  If the plastic jug that is given to you already has liquid in it, that is okay. Do not throw out the liquid or rinse out the jug. Some tests need the liquid to be added to your pee.  Keep your plastic jug cool (in an ice chest or the refrigerator) during the test.  When the 24 hours is over, bring your plastic jug to the clinic lab. Keep  the jug cool (in an ice chest) while you are bringing it to the lab. Document Released: 03/07/2009 Document Revised: 03/02/2012 Document Reviewed: 03/07/2009 [facility] Patient Information 2015 [place], [place]. This information is not intended to replace advice given to you by your health care provider. Make sure you discuss any questions you have with your health care provider. Bone Marrow Aspiration and Bone Biopsy Examination of the bone marrow is a valuable test to diagnose blood disorders. A bone marrow biopsy takes a sample of bone and a small amount of fluid and cells from inside the bone. A bone marrow aspiration removes only the marrow. Bone marrow aspiration and bone biopsies are used to stage different disorders of the blood, such as leukemia. Staging will help your caregiver understand how far the disease has progressed.  The tests are also useful in diagnosing:  Fever of unknown origin (FUO).  Bacterial infections and other widespread fungal infections.  Cancers that have spread (metastasized) to the bone marrow.  Diseases that are characterized by a deficiency of an enzyme (storage diseases). This includes:  Niemann-Pick disease.  Gaucher disease. PROCEDURE  Sites used to get samples include:   Back of your hip bone (posterior iliac crest).  Both aspiration and biopsy.  Front of your hip bone (anterior iliac crest).  Both aspiration and biopsy.  Breastbone (sternum).  Aspiration from your breastbone (done only in adults). This method is rarely used. When you get a hip bone aspiration:  You are placed lying on your side with the upper knee brought up and flexed with the lower leg straight.  The site is prepared, cleaned with an antiseptic scrub, and draped. This keeps the biopsy area clean.  The skin and the area down to the lining of the bone (periosteum) are made numb with a local anesthetic.  The bone marrow aspiration needle is inserted. You will feel pressure  on your bone.  Once inside the marrow cavity, a sample of bone marrow is sucked out (aspirated) for pathology slides.  The material collected for bone marrow slides is processed immediately by a technologist.  The technician selects the marrow particles to make the slides for pathology.  The marrow aspiration needle is removed. Then pressure is applied to the site with gauze until bleeding has stopped. Following an aspiration, a bone marrow biopsy may be performed as well. The technique for this is very similar. A dressing is then applied.  RISKS AND COMPLICATIONS  The main complications of a bone marrow aspiration and biopsy include infection and bleeding.  Complications are uncommon. The procedure may not be performed in patients with bleeding tendencies.  A very rare complication from the procedure is injury to the heart during a breastbone (sternal) marrow aspiration. Only bone marrow aspirations are performed in this area.  Long-lasting pain at the site of the bone marrow aspiration and biopsy is uncommon. Your caregiver will let you know when you are to get your results and will discuss them with you. You may make an appointment with your caregiver to find out the results.  Do not assume everything is normal if you have not heard from your caregiver or the medical facility. It is important for you to follow up on all of your test results. Document Released: 12/12/2004 Document Revised: 03/02/2012 Document Reviewed: 12/06/2008 [facility] Patient Information 2015 [place], [place]. This information is not intended to replace advice given to you by your health care provider. Make sure you discuss any questions you have with your health care provider.

## 2014-08-05 NOTE — Progress Notes (Signed)
[name]  OFFICE PROGRESS NOTE  Melissa Overman, MD [address] [place] [address]  DIAGNOSIS: Paraproteinemia - Plan: CT Biopsy, Immunofixation, urine  History of kidney cancer  Bence Jones proteinuria - Plan: Immunofixation, urine  Chief Complaint  Patient presents with  . Follow-up  . MGUS  . 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 female returns for followup of MGUS in the setting of previously diagnosed renal cell carcinoma, status post right nephrectomy.  Other than bilateral knee discomfort without redness or swelling, the patient has no new complaints. Appetite is good with regular bowel movements every other day. She denies any fever, night sweats, lower extremity swelling or redness, chest pain, PND, orthopnea, palpitations, sore throat, diarrhea, melena, hematochezia, hematuria but with signs and symptoms suggestive of stress incontinence of urine. She denies any peripheral paresthesias, cough, wheezing, skin rash, headache, or seizures.  MEDICAL HISTORY: Past Medical History  Diagnosis Date  . Vertigo, intermittent   . Hypothyroidism   . Hyperlipidemia   . Obesity   . Hypertension     severe and resistant to treatment; 2008-negative left renal angiogram  . Vitiligo   . Degenerative disc disease, lumbar   . Degenerative disc disease, cervical   . Chest pain 2008    2008-normal coronary angiography  . Renal cell carcinoma     Right nephrectomy    INTERIM HISTORY: has Hypothyroid; VITAMIN D DEFICIENCY; Obesity; Depression; HEARING LOSS; VERTIGO, INTERMITTENT; Vitiligo; Hyperlipidemia; Hypertension; History of kidney cancer; Chest pain; Dyspnea on exertion; Anemia; Routine general medical examination at a health care facility; Pulmonary fibrosis; Lung nodules; Left groin  pain; Positive ANA (antinuclear antibody); Knee pain, right; Radicular low back pain; Difficulty walking; Bilateral leg weakness; Bence Jones proteinuria; and Cystitis on her problem list.    ALLERGIES:  is allergic to morphine.  MEDICATIONS: has a current medication list which includes the following prescription(s): amlodipine besylate, aspirin, atenolol, budesonide-formoterol, calcitriol, fluoxetine, furosemide, hydralazine, hydralazine, levothyroxine, levothyroxine, liniments, lovastatin, multiple vitamins-minerals, spironolactone, and meclizine.  SURGICAL HISTORY:  Past Surgical History  Procedure Laterality Date  . Tubal ligation  1983    Bilateral  . Nephrectomy      Right; secondary to renal cell carcinoma  . Colonoscopy N/A 04/21/2014    Procedure: COLONOSCOPY;  Surgeon: Malissa Hippo, MD;  [facility];  Service: Endoscopy;  Laterality: N/A;  . Colonoscopy N/A 04/21/2014    Procedure: COLONOSCOPY;  Surgeon: Malissa Hippo, MD;  Location: [facility];  Service: Endoscopy;  Laterality: N/A;  1030    FAMILY HISTORY: family history includes Colon cancer in her father; Diabetes in her sister; Heart disease (age of onset: 66) in her mother; Heart failure in her sister; Hypertension in her mother and sister; Lupus in her brother.  SOCIAL HISTORY:  reports that she has never smoked. She has never used smokeless tobacco. She reports that she does not drink alcohol or use illicit drugs.  REVIEW OF SYSTEMS:  Other than that discussed above is noncontributory.  PHYSICAL EXAMINATION: ECOG PERFORMANCE STATUS: 1 - Symptomatic but completely ambulatory  Blood pressure 167/68, pulse 67, temperature 98.3 F (36.8 C), temperature source Oral, resp. rate 18, weight 244 lb 3.2 oz (110.768 kg).  GENERAL:alert, no distress and comfortable. Morbidly obese. SKIN: skin color, texture,  turgor are normal, no rashes or significant lesions EYES: PERLA; Conjunctiva are pink and non-injected,  sclera clear SINUSES: No redness or tenderness over maxillary or ethmoid sinuses OROPHARYNX:no exudate, no erythema on lips, buccal mucosa, or tongue. NECK: supple, thyroid normal size, non-tender, without nodularity. No masses CHEST: No breast masses with increased AP diameter. LYMPH:  no palpable lymphadenopathy in the cervical, axillary or inguinal LUNGS: clear to auscultation and percussion with normal breathing effort HEART: regular rate & rhythm and no murmurs. ABDOMEN:abdomen soft, non-tender and normal bowel sounds MUSCULOSKELETAL:no cyanosis of digits and no clubbing. Range of motion normal.  NEURO: alert & oriented x 3 with fluent speech, no focal motor/sensory deficits   LABORATORY DATA:Results for Melissa, [name] (MRN [id number]) as of 08/05/2014 09:48  Ref. Range 04/15/2014 09:50 04/15/2014 09:50 07/22/2014 09:41 07/22/2014 09:41  IgG (Immunoglobin G), Serum Latest Range: [phone] mg/dL 4098 (H) 1191 (H) 4782 (H) 3600 (H)    Results for Melissa, [name] (MRN [id number]) as of 08/05/2014 09:48  Ref. Range 04/15/2014 09:50 07/22/2014 09:41  Kappa, lamda light chain ratio Latest Range: 0.26-1.65  1.68 (H) 2.20 (H)   Results for Melissa, [name] (MRN [id number]) as of 08/05/2014 09:48  Ref. Range 07/22/2014 09:41  BUN Latest Range: 6-23 mg/dL 29 (H)  Creatinine Latest Range: 0.50-1.10 mg/dL 5.28 (H)    Lab on 41/32/4401  Component Date Value Ref Range Status  . WBC 07/22/2014 6.0  4.0 - 10.5 K/uL Final  . RBC 07/22/2014 3.78* 3.87 - 5.11 MIL/uL Final  . Hemoglobin 07/22/2014 12.0  12.0 - 15.0 g/dL Final  . HCT 02/72/5366 34.2* 36.0 - 46.0 % Final  . MCV 07/22/2014 90.5  78.0 - 100.0 fL Final  . MCH 07/22/2014 31.7  26.0 - 34.0 pg Final  . MCHC 07/22/2014 35.1  30.0 - 36.0 g/dL Final  . RDW 44/02/4741 16.3* 11.5 - 15.5 % Final  . Platelets 07/22/2014 327  150 - 400 K/uL Final  . Neutrophils Relative % 07/22/2014 58  43 - 77 % Final  . Neutro Abs 07/22/2014 3.5  1.7 - 7.7  K/uL Final  . Lymphocytes Relative 07/22/2014 31  12 - 46 % Final  . Lymphs Abs 07/22/2014 1.9  0.7 - 4.0 K/uL Final  . Monocytes Relative 07/22/2014 10  3 - 12 % Final  . Monocytes Absolute 07/22/2014 0.6  0.1 - 1.0 K/uL Final  . Eosinophils Relative 07/22/2014 1  0 - 5 % Final  . Eosinophils Absolute 07/22/2014 0.1  0.0 - 0.7 K/uL Final  . Basophils Relative 07/22/2014 0  0 - 1 % Final  . Basophils Absolute 07/22/2014 0.0  0.0 - 0.1 K/uL Final  . Sodium 07/22/2014 137  137 - 147 mEq/L Final  . Potassium 07/22/2014 4.6  3.7 - 5.3 mEq/L Final  . Chloride 07/22/2014 98  96 - 112 mEq/L Final  . CO2 07/22/2014 27  19 - 32 mEq/L Final  . Glucose, Bld 07/22/2014 103* 70 - 99 mg/dL Final  . BUN 59/56/3875 29* 6 - 23 mg/dL Final  . Creatinine, Ser 07/22/2014 2.23* 0.50 - 1.10 mg/dL Final  . Calcium 64/33/2951 9.9  8.4 - 10.5 mg/dL Final  . Total Protein 07/22/2014 9.7* 6.0 - 8.3 g/dL Final  . Albumin 88/41/6606 4.0  3.5 - 5.2 g/dL Final  . AST 30/16/0109 14  0 - 37 U/L Final  . ALT 07/22/2014 12  0 - 35 U/L Final  . Alkaline Phosphatase 07/22/2014 43  39 - 117  U/L Final  . Total Bilirubin 07/22/2014 0.5  0.3 - 1.2 mg/dL Final  . GFR calc non Af Amer 07/22/2014 21* >90 mL/min Final  . GFR calc Af Amer 07/22/2014 25* >90 mL/min Final   Comment: (NOTE)                          The eGFR has been calculated using the CKD EPI equation.                          This calculation has not been validated in all clinical situations.                          eGFR's persistently <90 mL/min signify possible Chronic Kidney                          Disease.  . Anion gap 07/22/2014 12  5 - 15 Final  . LDH 07/22/2014 134  94 - 250 U/L Final  . Total Protein ELP 07/22/2014 9.0* 6.0 - 8.3 g/dL Final  . IgG (Immunoglobin G), Serum 07/22/2014 3100* 690 - 1700 mg/dL Final  . IgA 16/09/9603 98  69 - 380 mg/dL Final  . IgM, Serum 54/08/8118 29* 52 - 322 mg/dL Final  . Immunofix Electr Int 07/22/2014 (NOTE)    Final   Comment: Monoclonal IgG kappa protein is present.                          Reviewed by Dallas Breeding, MD, PhD, FCAP (Electronic Signature on                          File)                          Performed at [facility]  . Beta-2 Microglobulin 07/22/2014 12.50* <=2.51 mg/L Final   Performed at [facility]  . Total Protein 07/22/2014 8.9* 6.0 - 8.3 g/dL Final  . Albumin ELP 14/78/2956 46.4* 55.8 - 66.1 % Final  . Alpha-1-Globulin 07/22/2014 4.2  2.9 - 4.9 % Final  . Alpha-2-Globulin 07/22/2014 9.3  7.1 - 11.8 % Final  . Beta Globulin 07/22/2014 4.9  4.7 - 7.2 % Final  . Beta 2 07/22/2014 3.0* 3.2 - 6.5 % Final  . Gamma Globulin 07/22/2014 32.2* 11.1 - 18.8 % Final  . M-Spike, % 07/22/2014 2.43   Final  . SPE Interp. 07/22/2014 (NOTE)   Final   Comment: A restricted band consistent with monoclonal protein is present.                          The monoclonal protein peak accounts for 2.43 g/dL of the total                          2.87 g/dL of protein in the gamma region.                          Results are consistent with SPE performed on 04/18/2014  Reviewed by  Dallas Breeding, MD, PhD, FCAP (Electronic Signature on File)  . Comment 07/22/2014 (NOTE)   Final   Comment: ---------------                          Serum protein electrophoresis is a useful screening procedure in the                          detection of various pathophysiologic states such as inflammation,                          gammopathies, protein loss and other dysproteinemias.  Immunofixation                          electrophoresis (IFE) is a more sensitive technique for the                          identification of M-proteins found in patients with monoclonal                          gammopathy of unknown significance (MGUS), amyloidosis, early or                          treated myeloma or macroglobulinemia, solitary plasmacytoma or                           extramedullary plasmacytoma.  . IgG (Immunoglobin G), Serum 07/22/2014 3600* 690 - 1700 mg/dL Final  . IgA 16/09/9603 101  69 - 380 mg/dL Final  . IgM, Serum 54/08/8118 29* 52 - 322 mg/dL Final  . Immunofix Electr Int 07/22/2014 (NOTE)   Final   Comment: Monoclonal IgG kappa protein is present.                          Reviewed by Dallas Breeding, MD, PhD, FCAP (Electronic Signature on                          File)                          Performed at [facility]  . Kappa free light chain 07/22/2014 7.55* 0.33 - 1.94 mg/dL Final  . Lamda free light chains 07/22/2014 3.43* 0.57 - 2.63 mg/dL Final  . Kappa, lamda light chain ratio 07/22/2014 2.20* 0.26 - 1.65 Final   Performed at [facility]    PATHOLOGY:  for CECE, CHRISTOPHE ([id number]) Patient: EARA, STUTZMAN Collected: 09/07/2013 Client: [facility] Accession: [id number] Received: 09/07/2013 Charlett Nose DOB: 10-02-1946 Age: 57 Gender: F Reported: 09/08/2013 [address] Patient Ph: [phone] MRN#: [id number] [place] [address] Client Acc#: Chart: Phone: [phone] Fax: LMP: Visit#: [id number] CC: Karle Barr, MD CYTOPATHOLOGY REPORT Adequacy Reason Satisfactory For Evaluation. Diagnosis THYROID, FINE NEEDLE ASPIRATION, RIGHT (PART 1 OF 1 COLLECTED 09/07/13): FINDINGS CONSISTENT WITH NON-NEOPLASTIC GOITER. Valinda Hoar MD Pathologist, Electronic Signature (Case signed 09/08/2013) Source Thyroid, Fine Needle Aspiration, ,Right (Part 1 of 1 collected 09/07/13) Gross Specimen: Received is/are 3 slides in 95% Ethyl alcohol, 3 air dried for Diff stain. 30cc's of light pink Cytolyt  solution. (BS:bs) Prepared: # Smears: 6 # Concentration Technique Slides (i.e. ThinPrep): 1 # Cell Block: Cell block attempted, not obtained Additional Studies: n/a Report signed out from the following location(s) Technical Component and Interpretation performed at [facility]  [address], [place], [place] [address].    Urinalysis    Component Value Date/Time   COLORURINE YELLOW 04/20/2014 0743   APPEARANCEUR CLEAR 04/20/2014 0743   LABSPEC 1.025 04/20/2014 0743   PHURINE 5.5 04/20/2014 0743   GLUCOSEU NEGATIVE 04/20/2014 0743   GLUCOSEU NEG mg/dL 5/57/3220 2542   HGBUR NEGATIVE 04/20/2014 0743   HGBUR large 05/18/2010 1003   BILIRUBINUR neg 05/04/2014 1133   BILIRUBINUR NEGATIVE 04/20/2014 0743   KETONESUR NEGATIVE 04/20/2014 0743   PROTEINUR neg 05/04/2014 1133   PROTEINUR NEGATIVE 04/20/2014 0743   UROBILINOGEN 0.2 05/04/2014 1133   UROBILINOGEN 0.2 04/20/2014 0743   NITRITE neg 05/04/2014 1133   NITRITE NEGATIVE 04/20/2014 0743   LEUKOCYTESUR Negative 05/04/2014 1133    RADIOGRAPHIC STUDIES:  CT Abdomen Pelvis Wo Contrast Status: Final result         PACS Images    Show images for CT Abdomen Pelvis Wo Contrast         Study Result    CLINICAL DATA: Left flank pain  EXAM:  CT ABDOMEN AND PELVIS WITHOUT CONTRAST  TECHNIQUE:  Multidetector CT imaging of the abdomen and pelvis was performed  following the standard protocol without intravenous contrast.  COMPARISON: MR ABDOMEN WO/W CM dated 03/24/2014  FINDINGS:  There is bibasilar interstitial fibrosis.  No renal, ureteral, or bladder calculi. No obstructive uropathy. No  perinephric stranding is seen. Prior right nephrectomy. The left  kidney is normal. The bladder is unremarkable.  The liver demonstrates no focal abnormality. The gallbladder is  unremarkable. The spleen demonstrates no focal abnormality. The  adrenal glands and pancreas are normal.  The unopacified stomach, duodenum, small intestine and large  intestine are unremarkable, but evaluation is limited by lack of  oral contrast. There is a normal caliber appendix in the right lower  quadrant without periappendiceal inflammatory changes. There is no  pneumoperitoneum, pneumatosis, or portal venous gas. There is no  abdominal or  pelvic free fluid. There is no lymphadenopathy. In the  uterus and ovaries are normal.  The abdominal aorta is normal in caliber with atherosclerosis.  There is no lytic or sclerotic osseous lesion. There is lower lumbar  spine spondylosis most severe at L5-S1.  IMPRESSION:  1. No urolithiasis or obstructive uropathy.  2. No acute abdominal or pelvic pathology.  3. Prior right nephrectomy. No evidence of recurrent or residual  malignancy in the surgical    DG Bone Survey Met Status: Final result         PACS Images    Show images for DG Bone Survey Met         Study Result    CLINICAL DATA: Protein abnormality. Possible Milo month.  EXAM:  METASTATIC BONE SURVEY  COMPARISON: None.  FINDINGS:  Lateral skull: No lytic lesion. Sinuses are clear.  One view chest: Some left ventricular prominence. Mild unfolding of  the aorta up. Some scarring in both lung bases. On the lateral  thoracic few, density in the posterior costophrenic angle region  could be scarring or possibly mild infiltrate if there is clinical  evidence of pneumonia. No regional bony abnormality.  Cervical spine two view: No lytic or destructive lesions.  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 Spondylosis at C4-5, C5-6 and C6-7. Facet  arthropathy at C3-4. Posterior ligamentous calcification.  Thoracic spine two views: No lytic ear destructive lesions. Bridging  osteophytes in the region from T5 through T11 suggesting diffuse  idiopathic skeletal hyperostosis.  Lumbar spine two views: No lytic occur destructive lesions. Advanced  degenerative disc disease and degenerative facet disease in the  lower lumbar spine including anterolisthesis at L4-5 of 5 mm.  AP pelvis one view: No lytic lesions.  Bilateral hips: No lytic lesions.  Left tib-fib: No lytic lesions.  Right tib-fib: No lytic lesions.  Left shoulder one view: No lytic lesions.  Right shoulder one view: No lytic lesions.  Left humerus one view: No lytic  lesions.  Right humerus one view: No lytic lesions.  Left forearm one view: No lytic lesions.  Right forearm one view: No lytic lesions.  IMPRESSION:  No lytic lesions.  Degenerative disease of the cervical, thoracic and lumbar spine as  outlined above      CT Chest High Resolution Status: Final result         PACS Images    Show images for CT Chest High Resolution         Study Result    CLINICAL DATA: Pulmonary fibrosis. Cough.  EXAM:  CT CHEST WITHOUT CONTRAST  TECHNIQUE:  Multidetector CT imaging of the chest was performed following the  standard protocol without intravenous contrast. High resolution  imaging of the lungs, as well as inspiratory and expiratory imaging,  was performed.  COMPARISON: 05/11/2013 and 12/11/2007.  FINDINGS:  Partially calcified nodule in the right lobe of the thyroid measures  approximately 2.6 cm, similar to prior exams. No pathologically  enlarged mediastinal lymph nodes. Hilar regions are difficult to  definitively evaluate without IV contrast. No axillary adenopathy.  Atherosclerotic calcification of the arterial vasculature, including  coronary arteries. Heart is mildly enlarged. No pericardial  effusion. Small hiatal hernia.  There is a peripheral and basilar predominant pattern of subpleural  reticulation. Traction bronchiectasis/ bronchiolectasis and probable  honeycombing are seen in the right middle lobe, lingula and both  lower lobes. Findings in the lower lobes show subtle progression  from 05/11/2013 and are clearly progressive from 12/11/2007. 4 mm  nodule in the medial left upper lobe (image 11, series 3) appears  slightly more solid than on prior exams. Additional scattered  ground-glass nodules measure 4 mm or less in size and are unchanged  from 05/11/2013. No air trapping on inspiratory and expiratory  imaging. No pleural fluid. Airway is otherwise unremarkable.  Incidental imaging of the upper abdomen shows a  visualized portions  of the liver, gallbladder, adrenal glands, left kidney, spleen,  pancreas, stomach and bowel to be grossly unremarkable.  Gastrohepatic ligament lymph nodes are not enlarged by CT size  criteria. No worrisome lytic or sclerotic lesions. Degenerative  changes are seen in the spine.  IMPRESSION:  1. Subpleural and basilar predominant pattern of pulmonary fibrosis,  as described above, clearly progressive from 12/11/2007 and  indicative of usual interstitial pneumonitis (UIP).  2. 4 mm nodule in the left upper lobe appears more solid than on  prior examinations. As this patient is at increased risk for  bronchogenic carcinoma, followup in 1 year is recommended. This  recommendation follows the consensus statement: Guidelines for  Management of Small Pulmonary Nodules Detected on CT Scans: A  Statement from the Fleischner Society as published in Radiology  2005; 237:395-400.  3. Coronary artery calcification.  Electronically Signed  By: Leanna Battles M.D.  On: 05/11/2014   .  ASSESSMENT:  #1. IgG paraprotein, worsening with elevated/lambda ratio with Bence-Jones proteinuria with worsening renal function as well, possible evolution into multiple myeloma. #2.History of right renal cell carcinoma, status post resection with no postop therapy, surgery performed in December of 2008.  #3. Hypertension, on treatment.  #4. Height thyroidism, on treatment.  #5. Hyperlipidemia, on treatment.  #6. Depression, on treatment.  #7. Chronic obstructive pulmonary disease, on treatment   PLAN:  #1. Bone marrow aspiration and biopsy with cytogenetics and flow cytometry. #2. Followup in 3 weeks with CBC #3. 24-hour urine quantitative immunofixation.   All questions were answered. The patient knows to call the clinic with any problems, questions or concerns. We can certainly see the patient much sooner if necessary.   I spent 25 minutes counseling the patient face to face. The  total time spent in the appointment was 30 minutes.    Maurilio Lovely, MD 08/05/2014 10:42 AM  DISCLAIMER:  This note was dictated with voice recognition software.  Similar sounding words can inadvertently be transcribed inaccurately and may not be corrected upon review.

## 2014-08-08 DIAGNOSIS — D892 Hypergammaglobulinemia, unspecified: Secondary | ICD-10-CM | POA: Diagnosis not present

## 2014-08-08 DIAGNOSIS — R809 Proteinuria, unspecified: Secondary | ICD-10-CM | POA: Diagnosis present

## 2014-08-09 ENCOUNTER — Other Ambulatory Visit (HOSPITAL_COMMUNITY): Payer: Self-pay | Admitting: *Deleted

## 2014-08-09 ENCOUNTER — Other Ambulatory Visit: Payer: Self-pay | Admitting: Radiology

## 2014-08-09 DIAGNOSIS — R803 Bence Jones proteinuria: Secondary | ICD-10-CM

## 2014-08-09 DIAGNOSIS — D892 Hypergammaglobulinemia, unspecified: Secondary | ICD-10-CM

## 2014-08-10 ENCOUNTER — Encounter (HOSPITAL_COMMUNITY): Payer: Self-pay | Admitting: Pharmacy Technician

## 2014-08-11 LAB — IMMUNOFIXATION, URINE

## 2014-08-15 ENCOUNTER — Ambulatory Visit (HOSPITAL_COMMUNITY)
Admission: RE | Admit: 2014-08-15 | Discharge: 2014-08-15 | Disposition: A | Discharge status: Home / Self Care | Payer: Medicare Other | Source: Ambulatory Visit | Attending: Hematology and Oncology | Admitting: Hematology and Oncology

## 2014-08-15 ENCOUNTER — Encounter (HOSPITAL_COMMUNITY): Payer: Self-pay

## 2014-08-15 DIAGNOSIS — D892 Hypergammaglobulinemia, unspecified: Secondary | ICD-10-CM | POA: Diagnosis present

## 2014-08-15 LAB — CBC
HCT: 30.6 % — ABNORMAL LOW (ref 36.0–46.0)
Hemoglobin: 10.9 g/dL — ABNORMAL LOW (ref 12.0–15.0)
MCH: 31.8 pg (ref 26.0–34.0)
MCHC: 35.6 g/dL (ref 30.0–36.0)
MCV: 89.2 fL (ref 78.0–100.0)
PLATELETS: 272 10*3/uL (ref 150–400)
RBC: 3.43 MIL/uL — ABNORMAL LOW (ref 3.87–5.11)
RDW: 16.2 % — ABNORMAL HIGH (ref 11.5–15.5)
WBC: 7.1 10*3/uL (ref 4.0–10.5)

## 2014-08-15 LAB — PROTIME-INR
INR: 1.12 (ref 0.00–1.49)
Prothrombin Time: 14.4 seconds (ref 11.6–15.2)

## 2014-08-15 LAB — BONE MARROW EXAM

## 2014-08-15 LAB — APTT: APTT: 40 s — AB (ref 24–37)

## 2014-08-15 MED ORDER — SODIUM CHLORIDE 0.9 % IV SOLN
INTRAVENOUS | Status: DC
  Administered 2014-08-15: 08:00:00 via INTRAVENOUS

## 2014-08-15 MED ORDER — FENTANYL CITRATE 0.05 MG/ML IJ SOLN
INTRAMUSCULAR | Status: AC | PRN
Start: 2014-08-15 — End: 2014-08-15
  Administered 2014-08-15: 50 ug via INTRAVENOUS

## 2014-08-15 MED ORDER — MIDAZOLAM HCL 2 MG/2ML IJ SOLN
INTRAMUSCULAR | Status: AC
  Filled 2014-08-15: qty 6

## 2014-08-15 MED ORDER — FENTANYL CITRATE 0.05 MG/ML IJ SOLN
INTRAMUSCULAR | Status: AC
  Filled 2014-08-15: qty 6

## 2014-08-15 MED ORDER — MIDAZOLAM HCL 2 MG/2ML IJ SOLN
INTRAMUSCULAR | Status: AC | PRN
  Administered 2014-08-15 (×3): 1 mg via INTRAVENOUS

## 2014-08-15 NOTE — H&P (Signed)
Melissa [name] is an 68 y.o. female.   Chief Complaint: Pt with hx renal cell cancer: R nephrectomy 2008 Known monoclonal gammopathy of undetermined significance Worseing renal function; proteinuria; elevated lambda ratio Scheduled now for Bone Marrow biopsy per Dr Barnet Glasgow  HPI: MGUS; renal ca; HTN; hypothyroid; HLD  Past Medical History  Diagnosis Date  . Vertigo, intermittent   . Hypothyroidism   . Hyperlipidemia   . Obesity   . Hypertension     severe and resistant to treatment; 2008-negative left renal angiogram  . Vitiligo   . Degenerative disc disease, lumbar   . Degenerative disc disease, cervical   . Chest pain 2008    2008-normal coronary angiography  . Renal cell carcinoma     Right nephrectomy    Past Surgical History  Procedure Laterality Date  . Tubal ligation  1983    Bilateral  . Nephrectomy      Right; secondary to renal cell carcinoma  . Colonoscopy N/A 04/21/2014    Procedure: COLONOSCOPY;  Surgeon: Rogene Houston, MD;  [facility];  Service: Endoscopy;  Laterality: N/A;  . Colonoscopy N/A 04/21/2014    Procedure: COLONOSCOPY;  Surgeon: Rogene Houston, MD;  [facility];  Service: Endoscopy;  Laterality: N/A;  1030    Family History  Problem Relation Age of Onset  . Heart disease Mother 8  . Hypertension Mother   . Colon cancer Father   . Hypertension Sister     x2  . Diabetes Sister     x2  . Heart failure Sister   . Lupus Brother    Social History:  reports that she has never smoked. She has never used smokeless tobacco. She reports that she does not drink alcohol or use illicit drugs.  Allergies:  Allergies  Allergen Reactions  . Morphine Other (See Comments)    unknown     (Not in a hospital admission)  Results for orders placed during the hospital encounter of 08/15/14 (from the past 48 hour(s))  APTT     Status: Abnormal   Collection Time    08/15/14  8:00 AM      Result Value Ref Range   aPTT 40 (*)  24 - 37 seconds   Comment:            IF BASELINE aPTT IS ELEVATED,     SUGGEST PATIENT RISK ASSESSMENT     BE USED TO DETERMINE APPROPRIATE     ANTICOAGULANT THERAPY.  CBC     Status: Abnormal   Collection Time    08/15/14  8:00 AM      Result Value Ref Range   WBC 7.1  4.0 - 10.5 K/uL   RBC 3.43 (*) 3.87 - 5.11 MIL/uL   Hemoglobin 10.9 (*) 12.0 - 15.0 g/dL   HCT 30.6 (*) 36.0 - 46.0 %   MCV 89.2  78.0 - 100.0 fL   MCH 31.8  26.0 - 34.0 pg   MCHC 35.6  30.0 - 36.0 g/dL   RDW 16.2 (*) 11.5 - 15.5 %   Platelets 272  150 - 400 K/uL  PROTIME-INR     Status: None   Collection Time    08/15/14  8:00 AM      Result Value Ref Range   Prothrombin Time 14.4  11.6 - 15.2 seconds   INR 1.12  0.00 - 1.49   No results found.  Review of Systems  Constitutional: Positive for malaise/fatigue. Negative for  fever and weight loss.  Respiratory: Negative for shortness of breath.   Cardiovascular: Negative for chest pain.  Gastrointestinal: Negative for nausea, vomiting and abdominal pain.  Neurological: Positive for weakness. Negative for dizziness.  Psychiatric/Behavioral: Negative for substance abuse.    Blood pressure 149/57, pulse 66, temperature 98.4 F (36.9 C), temperature source Oral, resp. rate 16, SpO2 100.00%. Physical Exam  Constitutional: She is oriented to person, place, and time. She appears well-nourished.  Cardiovascular: Normal rate and regular rhythm.   No murmur heard. Respiratory: Effort normal and breath sounds normal. She has no wheezes.  GI: Soft. Bowel sounds are normal. There is no tenderness.  Musculoskeletal: Normal range of motion.  Neurological: She is alert and oriented to person, place, and time.  Skin: Skin is warm and dry.  Psychiatric: She has a normal mood and affect. Her behavior is normal. Judgment and thought content normal.     Assessment/Plan Pt with hx RCC- R nephrectomy 2008 MGUS: proteinuria; elevated lambda ration Scheduled for BM bx Pt  and dtr aware of procedure benefits and risks and agreeable to proceed Consent signed and in chart  [place] A 08/15/2014, 8:33 AM

## 2014-08-15 NOTE — Discharge Instructions (Signed)

## 2014-08-18 ENCOUNTER — Other Ambulatory Visit: Payer: Self-pay | Admitting: Family Medicine

## 2014-08-22 LAB — CHROMOSOME ANALYSIS, BONE MARROW

## 2014-08-24 ENCOUNTER — Other Ambulatory Visit (INDEPENDENT_AMBULATORY_CARE_PROVIDER_SITE_OTHER): Payer: Self-pay | Admitting: Otolaryngology

## 2014-08-24 DIAGNOSIS — E041 Nontoxic single thyroid nodule: Secondary | ICD-10-CM

## 2014-08-26 ENCOUNTER — Encounter (HOSPITAL_COMMUNITY): Payer: Medicare Other | Attending: Hematology and Oncology

## 2014-08-26 VITALS — BP 124/65 | HR 63 | Temp 98.0°F | Resp 20 | Wt 243.8 lb

## 2014-08-26 DIAGNOSIS — C9000 Multiple myeloma not having achieved remission: Secondary | ICD-10-CM

## 2014-08-26 DIAGNOSIS — R803 Bence Jones proteinuria: Secondary | ICD-10-CM

## 2014-08-26 DIAGNOSIS — R809 Proteinuria, unspecified: Secondary | ICD-10-CM | POA: Insufficient documentation

## 2014-08-26 DIAGNOSIS — D892 Hypergammaglobulinemia, unspecified: Secondary | ICD-10-CM | POA: Insufficient documentation

## 2014-08-26 DIAGNOSIS — Z85528 Personal history of other malignant neoplasm of kidney: Secondary | ICD-10-CM

## 2014-08-26 DIAGNOSIS — I129 Hypertensive chronic kidney disease with stage 1 through stage 4 chronic kidney disease, or unspecified chronic kidney disease: Secondary | ICD-10-CM

## 2014-08-26 DIAGNOSIS — J301 Allergic rhinitis due to pollen: Secondary | ICD-10-CM

## 2014-08-26 DIAGNOSIS — N189 Chronic kidney disease, unspecified: Secondary | ICD-10-CM

## 2014-08-26 HISTORY — DX: Multiple myeloma not having achieved remission: C90.00

## 2014-08-26 MED ORDER — FLUTICASONE PROPIONATE 50 MCG/ACT NA SUSP: 2.0000 | Freq: Every day | NASAL | Status: DC

## 2014-08-26 NOTE — Patient Instructions (Addendum)
[facility] Discharge Instructions  RECOMMENDATIONS MADE BY THE CONSULTANT AND ANY TEST RESULTS WILL BE SENT TO YOUR REFERRING PHYSICIAN.  EXAM FINDINGS BY THE PHYSICIAN TODAY AND SIGNS OR SYMPTOMS TO REPORT TO CLINIC OR PRIMARY PHYSICIAN:   You have Multiple Myeloma. This is the most common form of cancer of the bone. Multiple Myeloma occurs when the bone barrow overproduces plasma cells. These plasma cells begin crowding your bone marrow. This overcrowding of plasma cells leaves your bone marrow without any room for white blood cells (fights infections), red blood cells (makes up blood), and platelets (keeps you from bleeding uncontrollably).  When these healthy cells are not in normal abundance, it can cause you to feel bad. Therefore the treatment below is what Dr. Barnet Glasgow would like to give you.   The treatment that Dr. Barnet Glasgow would like to give you is called RVD: Revlimid (chemo pill), Velcade (subcutaneous chemo injection in the abdomen), Dexamethasone (steroid-pill). We have you scheduled to start on 09/05/14.  Hildred Alamin the Nurse Navigator will teach you regarding these drugs and side effects. Her number is [phone].  You can go to [facility] in [place] for a 2nd opinion if you like, we will make that referral for you. Just let us know.   We have chemo scheduled for 09/05/14 @ 1:30 for chemo and at 2:00 to see [facility] PA-C.   Thank you for choosing [facility] to provide your oncology and hematology care.  To afford each patient quality time with our providers, please arrive at least 15 minutes before your scheduled appointment time.  With your help, our goal is to use those 15 minutes to complete the necessary work-up to ensure our physicians have the information they need to help with your evaluation and healthcare recommendations.    Effective January 1st, 2014, we ask that you re-schedule your appointment with our physicians should you arrive 10  or more minutes late for your appointment.  We strive to give you quality time with our providers, and arriving late affects you and other patients whose appointments are after yours.    Again, thank you for choosing [facility].  Our hope is that these requests will decrease the amount of time that you wait before being seen by our physicians.       _____________________________________________________________  Should you have questions after your visit to [facility], please contact our office at [phone] between the hours of 8:30 a.m. and 5:00 p.m.  Voicemails left after 4:30 p.m. will not be returned until the following business day.  For prescription refill requests, have your pharmacy contact our office with your prescription refill request.    Multiple Myeloma Multiple myeloma is the most common cancer of bone. It is caused by the uncontrolled multiplication of a type of white blood cell in the marrow. This white blood cell is called a plasma cell. This means the bone marrow is overworking producing plasma cells. Soon these overproduced cells begin to take up room in the marrow that is needed by other cells. This means that there are soon not enough red or white blood cells or platelets. Not enough red cells mean that the person is anemic. There are not enough red blood cells to carry oxygen around the body. There are not enough white blood cells to fight disease. This causes the person with multiple myeloma to not feel well. There is also bone pain through much of the body.  SYMPTOMS  Anemia causes fatigue (tiredness) and weakness.  Back pain is common. This is from fractures (break in bones) caused by damage to the bones of the back.  Lack of white blood cells makes infection more likely.  Bleeding is a common problem from lack of the cells (platelets). Platelets help blood clots form. This may show up as bleeding from any place. Commonly this shows up as  bleeding from the nose or gums.  Fractures (bone breaks) are more common anywhere. The back and ribs are the most commonly fractured areas. DIAGNOSIS  This tumor is often suggested by blood tests. Often doing a bone marrow sample makes the diagnosis (learning what is wrong). This is a test performed by taking a small sample of bone with a small needle. This bone often comes from the sternum (breast bone). This sample is sent to a pathologist (a specialist in looking at tissue under a microscope). After looking at the sample under the microscope, the pathologist is able to make a diagnosis of the problem. X-rays may also show boney changes. TREATMENT   Occasionally, anti-cancer medications may be used with multiple myeloma. Your caregiver can discuss this with you.  Medications can also be given to help with the bone pain.  There is no cure for multiple myeloma. Lifestyle changes can add years of quality living. HOME CARE INSTRUCTIONS  Often there is no specific treatment for multiple myeloma. Most of the treatment consists of adjustments in dietary and living activities. Some of these changes include:  Your dietitian or caregiver helping you with your dietary questions.  Taking iron and vitamins as prescribed by your caregiver.  Eating a well balanced diet.  Staying active, but follow restrictions suggested by your caregiver. Avoiding heavy lifting (more than 10 pounds) and activities that cause increased pain.  Drinking plenty of water.  Using back braces and a cane may help with some of the boney pain. SEEK IMMEDIATE MEDICAL CARE IF:  You develop severe, uncontrolled boney pain.  You or your family notices confusion, problems with decision-making or inability to stay awake.  You notice increased urination or constipation.  You notice problems holding your water or stool.  You have numbness or loss of control of your extremities (arms/hands or legs/feet). Document Released:  09/03/2001 Document Revised: 03/02/2012 Document Reviewed: 12/04/2008 [facility] Patient Information 2015 [place], [place]. This information is not intended to replace advice given to you by your health care provider. Make sure you discuss any questions you have with your health care provider. Lenalidomide Oral Capsules What is this medicine? LENALIDOMIDE (len a LID oh mide) is a chemotherapy drug that targets specific proteins within cancer cells and stops the cancer cell from growing. It is used to treat multiple myeloma, mantle cell lymphoma, and some myelodysplastic syndromes that cause severe anemia requiring blood transfusions. This medicine may be used for other purposes; ask your health care provider or pharmacist if you have questions. COMMON BRAND NAME(S): Revlimid What should I tell my health care provider before I take this medicine? They need to know if you have any of these conditions: -blood clots in the legs or the lungs -high blood pressure -high cholesterol -infection -irregular monthly periods or menstrual cycles -kidney disease -liver disease -smoke tobacco -thyroid disease -an unusual or allergic reaction to lenalidomide, other medicines, foods, dyes, or preservatives -pregnant or trying to get pregnant -breast-feeding How should I use this medicine? Take this medicine by mouth with a glass of water. Follow the directions on  the prescription label. Do not cut, crush, or chew this medicine. Take your medicine at regular intervals. Do not take it more often than directed. Do not stop taking except on your doctor's advice. A MedGuide will be given with each prescription and refill. Read this guide carefully each time. The MedGuide may change frequently. Talk to your pediatrician regarding the use of this medicine in children. Special care may be needed. Overdosage: If you think you have taken too much of this medicine contact a poison control center or emergency room at  once. NOTE: This medicine is only for you. Do not share this medicine with others. What if I miss a dose? If you miss a dose, take it as soon as you can. If your next dose is to be taken in less than 12 hours, then do not take the missed dose. Take the next dose at your regular time. Do not take double or extra doses. What may interact with this medicine? This medicine may interact with the following medications: -digoxin -medicines that increase the risk of thrombosis like estrogens or erythropoietic agents (e.g., epoetin alfa and darbepoetin alfa) -warfarin This list may not describe all possible interactions. Give your health care provider a list of all the medicines, herbs, non-prescription drugs, or dietary supplements you use. Also tell them if you smoke, drink alcohol, or use illegal drugs. Some items may interact with your medicine. What should I watch for while using this medicine? Visit your doctor for regular check ups. Tell your doctor or healthcare professional if your symptoms do not start to get better or if they get worse. You will need to have important blood work done while you are taking this medicine. This medicine is available only through a special program. Doctors, pharmacies, and patients must meet all of the conditions of the program. Your health care provider will help you get signed up with the program if you need this medicine. Through the program you will only receive up to a 28 day supply of the medicine at one time. You will need a new prescription for each refill. This medicine can cause birth defects. Do not get pregnant while taking this drug. Females with child-bearing potential will need to have 2 negative pregnancy tests before starting this medicine. Pregnancy testing must be done every 2 to 4 weeks as directed while taking this medicine. Use 2 reliable forms of birth control together while you are taking this medicine and for 1 month after you stop taking this  medicine. If you think that you might be pregnant talk to your doctor right away. Men must use a latex condom during sexual contact with a woman while taking this medicine and for 28 days after you stop taking this medicine. A latex condom is needed even if you have had a vasectomy. Contact your doctor right away if your partner becomes pregnant. Do not donate sperm while taking this medicine and for 28 days after you stop taking this medicine. Do not give blood while taking the medicine and for 1 month after completion of treatment to avoid exposing pregnant women to the medicine through the donated blood. Talk to your doctor about your risk of cancer. You may be more at risk for certain types of cancers if you take this medicine. What side effects may I notice from receiving this medicine? Side effects that you should report to your doctor or health care professional as soon as possible: -allergic reactions like skin rash, itching or hives,  swelling of the face, lips, or tongue -breathing problems -chest pain or tightness -fast, irregular heartbeat -low blood counts - this medicine may decrease the number of white blood cells, red blood cells and platelets. You may be at increased risk for infections and bleeding. -seizures -signs and symptoms of bleeding such as bloody or black, tarry stools; red or dark-brown urine; spitting up blood or brown material that looks like coffee grounds; red spots on the skin; unusual bruising or bleeding from the eye, gums, or nose -signs and symptoms of a blood clot such as breathing problems; changes in vision; chest pain; severe, sudden headache; pain, swelling, warmth in the leg; trouble speaking; sudden numbness or weakness of the face, arm or leg -signs and symptoms of liver injury like dark yellow or brown urine; general ill feeling or flu-like symptoms; light-colored stools; loss of appetite; nausea; right upper belly pain; unusually weak or tired; yellowing of  the eyes or skin -signs and symptoms of a stroke like changes in vision; confusion; trouble speaking or understanding; severe headaches; sudden numbness or weakness of the face, arm or leg; trouble walking; dizziness; loss of balance or coordination -sweating -vomiting Side effects that usually do not require medical attention (report to your doctor or health care professional if they continue or are bothersome): -constipation -cough -diarrhea -tiredness This list may not describe all possible side effects. Call your doctor for medical advice about side effects. You may report side effects to FDA at 1-800-FDA-1088. Where should I keep my medicine? Keep out of the reach of children. Store at room temperature between 15 and 30 degrees C (59 and 86 degrees F). Throw away any unused medicine after the expiration date. NOTE: This sheet is a summary. It may not cover all possible information. If you have questions about this medicine, talk to your doctor, pharmacist, or health care provider.  2015, Elsevier/Gold Standard. (2014-03-15 18:30:01) Bortezomib injection What is this medicine? BORTEZOMIB (bor TEZ oh mib) is a chemotherapy drug. It slows the growth of cancer cells. This medicine is used to treat multiple myeloma, and certain lymphomas, such as mantle-cell lymphoma. This medicine may be used for other purposes; ask your health care provider or pharmacist if you have questions. COMMON BRAND NAME(S): Velcade What should I tell my health care provider before I take this medicine? They need to know if you have any of these conditions: -diabetes -heart disease -irregular heartbeat -liver disease -on hemodialysis -low blood counts, like low white blood cells, platelets, or hemoglobin -peripheral neuropathy -taking medicine for blood pressure -an unusual or allergic reaction to bortezomib, mannitol, boron, other medicines, foods, dyes, or preservatives -pregnant or trying to get  pregnant -breast-feeding How should I use this medicine? This medicine is for injection into a vein or for injection under the skin. It is given by a health care professional in a hospital or clinic setting. Talk to your pediatrician regarding the use of this medicine in children. Special care may be needed. Overdosage: If you think you have taken too much of this medicine contact a poison control center or emergency room at once. NOTE: This medicine is only for you. Do not share this medicine with others. What if I miss a dose? It is important not to miss your dose. Call your doctor or health care professional if you are unable to keep an appointment. What may interact with this medicine? This medicine may interact with the following medications: -ketoconazole -rifampin -ritonavir -St. John's Wort This list may  not describe all possible interactions. Give your health care provider a list of all the medicines, herbs, non-prescription drugs, or dietary supplements you use. Also tell them if you smoke, drink alcohol, or use illegal drugs. Some items may interact with your medicine. What should I watch for while using this medicine? Visit your doctor for checks on your progress. This drug may make you feel generally unwell. This is not uncommon, as chemotherapy can affect healthy cells as well as cancer cells. Report any side effects. Continue your course of treatment even though you feel ill unless your doctor tells you to stop. You may get drowsy or dizzy. Do not drive, use machinery, or do anything that needs mental alertness until you know how this medicine affects you. Do not stand or sit up quickly, especially if you are an older patient. This reduces the risk of dizzy or fainting spells. In some cases, you may be given additional medicines to help with side effects. Follow all directions for their use. Call your doctor or health care professional for advice if you get a fever, chills or sore  throat, or other symptoms of a cold or flu. Do not treat yourself. This drug decreases your body's ability to fight infections. Try to avoid being around people who are sick. This medicine may increase your risk to bruise or bleed. Call your doctor or health care professional if you notice any unusual bleeding. You may need blood work done while you are taking this medicine. In some patients, this medicine may cause a serious brain infection that may cause death. If you have any problems seeing, thinking, speaking, walking, or standing, tell your doctor right away. If you cannot reach your doctor, urgently seek other source of medical care. Do not become pregnant while taking this medicine. Women should inform their doctor if they wish to become pregnant or think they might be pregnant. There is a potential for serious side effects to an unborn child. Talk to your health care professional or pharmacist for more information. Do not breast-feed an infant while taking this medicine. Check with your doctor or health care professional if you get an attack of severe diarrhea, nausea and vomiting, or if you sweat a lot. The loss of too much body fluid can make it dangerous for you to take this medicine. What side effects may I notice from receiving this medicine? Side effects that you should report to your doctor or health care professional as soon as possible: -allergic reactions like skin rash, itching or hives, swelling of the face, lips, or tongue -breathing problems -changes in hearing -changes in vision -fast, irregular heartbeat -feeling faint or lightheaded, falls -pain, tingling, numbness in the hands or feet -right upper belly pain -seizures -swelling of the ankles, feet, hands -unusual bleeding or bruising -unusually weak or tired -vomiting -yellowing of the eyes or skin Side effects that usually do not require medical attention (report to your doctor or health care professional if they  continue or are bothersome): -changes in emotions or moods -constipation -diarrhea -loss of appetite -headache -irritation at site where injected -nausea This list may not describe all possible side effects. Call your doctor for medical advice about side effects. You may report side effects to FDA at 1-800-FDA-1088. Where should I keep my medicine? This drug is given in a hospital or clinic and will not be stored at home. NOTE: This sheet is a summary. It may not cover all possible information. If you have questions about  this medicine, talk to your doctor, pharmacist, or health care provider.  2015, Elsevier/Gold Standard. (2013-10-04 12:46:32) Dexamethasone tablets What is this medicine? DEXAMETHASONE (dex a METH a sone) is a corticosteroid. It is commonly used to treat inflammation of the skin, joints, lungs, and other organs. Common conditions treated include asthma, allergies, and arthritis. It is also used for other conditions, such as blood disorders and diseases of the adrenal glands. This medicine may be used for other purposes; ask your health care provider or pharmacist if you have questions. COMMON BRAND NAME(S): Decadron, DexPak Sterling Big, DexPak TaperPak, Zema-Pak What should I tell my health care provider before I take this medicine? They need to know if you have any of these conditions: -Cushing's syndrome -diabetes -glaucoma -heart problems or disease -high blood pressure -infection like herpes, measles, tuberculosis, or chickenpox -kidney disease -liver disease -mental problems -myasthenia gravis -osteoporosis -previous heart attack -seizures -stomach, ulcer or intestine disease including colitis and diverticulitis -thyroid problem -an unusual or allergic reaction to dexamethasone, corticosteroids, other medicines, lactose, foods, dyes, or preservatives -pregnant or trying to get pregnant -breast-feeding How should I use this medicine? Take this medicine by  mouth with a drink of water. Follow the directions on the prescription label. Take it with food or milk to avoid stomach upset. If you are taking this medicine once a day, take it in the morning. Do not take more medicine than you are told to take. Do not suddenly stop taking your medicine because you may develop a severe reaction. Your doctor will tell you how much medicine to take. If your doctor wants you to stop the medicine, the dose may be slowly lowered over time to avoid any side effects. Talk to your pediatrician regarding the use of this medicine in children. Special care may be needed. Patients over 36 years old may have a stronger reaction and need a smaller dose. Overdosage: If you think you have taken too much of this medicine contact a poison control center or emergency room at once. NOTE: This medicine is only for you. Do not share this medicine with others. What if I miss a dose? If you miss a dose, take it as soon as you can. If it is almost time for your next dose, talk to your doctor or health care professional. You may need to miss a dose or take an extra dose. Do not take double or extra doses without advice. What may interact with this medicine? Do not take this medicine with any of the following medications: -mifepristone, RU-486 -vaccines This medicine may also interact with the following medications: -amphotericin B -antibiotics like clarithromycin, erythromycin, and troleandomycin -aspirin and aspirin-like drugs -barbiturates like phenobarbital -carbamazepine -cholestyramine -cholinesterase inhibitors like donepezil, galantamine, rivastigmine, and tacrine -cyclosporine -digoxin -diuretics -ephedrine -female hormones, like estrogens or progestins and birth control pills -indinavir -isoniazid -ketoconazole -medicines for diabetes -medicines that improve muscle tone or strength for conditions like myasthenia gravis -NSAIDs, medicines for pain and inflammation,  like ibuprofen or naproxen -phenytoin -rifampin -thalidomide -warfarin This list may not describe all possible interactions. Give your health care provider a list of all the medicines, herbs, non-prescription drugs, or dietary supplements you use. Also tell them if you smoke, drink alcohol, or use illegal drugs. Some items may interact with your medicine. What should I watch for while using this medicine? Visit your doctor or health care professional for regular checks on your progress. If you are taking this medicine over a prolonged period, carry an identification  card with your name and address, the type and dose of your medicine, and your doctor's name and address. This medicine may increase your risk of getting an infection. Stay away from people who are sick. Tell your doctor or health care professional if you are around anyone with measles or chickenpox. If you are going to have surgery, tell your doctor or health care professional that you have taken this medicine within the last twelve months. Ask your doctor or health care professional about your diet. You may need to lower the amount of salt you eat. The medicine can increase your blood sugar. If you are a diabetic check with your doctor if you need help adjusting the dose of your diabetic medicine. What side effects may I notice from receiving this medicine? Side effects that you should report to your doctor or health care professional as soon as possible: -allergic reactions like skin rash, itching or hives, swelling of the face, lips, or tongue -changes in vision -fever, sore throat, sneezing, cough, or other signs of infection, wounds that will not heal -increased thirst -mental depression, mood swings, mistaken feelings of self importance or of being mistreated -pain in hips, back, ribs, arms, shoulders, or legs -redness, blistering, peeling or loosening of the skin, including inside the mouth -trouble passing urine or change in  the amount of urine -swelling of feet or lower legs -unusual bleeding or bruising Side effects that usually do not require medical attention (report to your doctor or health care professional if they continue or are bothersome): -headache -nausea, vomiting -skin problems, acne, thin and shiny skin -weight gain This list may not describe all possible side effects. Call your doctor for medical advice about side effects. You may report side effects to FDA at 1-800-FDA-1088. Where should I keep my medicine? Keep out of the reach of children. Store at room temperature between 20 and 25 degrees C (68 and 77 degrees F). Protect from light. Throw away any unused medicine after the expiration date. NOTE: This sheet is a summary. It may not cover all possible information. If you have questions about this medicine, talk to your doctor, pharmacist, or health care provider.  2015, Elsevier/Gold Standard. (2008-03-31 14:02:13)

## 2014-08-26 NOTE — Progress Notes (Signed)
[facility]  OFFICE PROGRESS NOTE  Melissa Overman, MD [address] [place] [address]  DIAGNOSIS: Multiple myeloma, without mention of having achieved remission  History of kidney cancer  Bence Jones proteinuria  Allergic rhinitis due to pollen  Chief Complaint  Patient presents with  . 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 Bone marrow aspiration and biopsy done on 08/15/2014.   INTERVAL HISTORY: Melissa [name] 68 y.o. female returns for followup of MGUS in the setting of previously diagnosed renal cell carcinoma, status post right nephrectomy.  Primary complaint is posterior nasal drip with cough and production of brown phlegm. She does use of the core. She does have allergies this time of year. She denies any worsening bone pain, lower extremity swelling or redness, fever, night sweats, abdominal pain, nausea, vomiting, diarrhea, constipation, urinary hesitancy, hematuria, melena, hematochezia, skin rash, headache, or seizures.    MEDICAL HISTORY: Past Medical History  Diagnosis Date  . Vertigo, intermittent   . Hypothyroidism   . Hyperlipidemia   . Obesity   . Hypertension     severe and resistant to treatment; 2008-negative left renal angiogram  . Vitiligo   . Degenerative disc disease, lumbar   . Degenerative disc disease, cervical   . Chest pain 2008    2008-normal coronary angiography  . Renal cell carcinoma     Right nephrectomy    INTERIM HISTORY: has Hypothyroid; VITAMIN D DEFICIENCY; Obesity; Depression; HEARING LOSS; VERTIGO, INTERMITTENT; Vitiligo; Hyperlipidemia; Hypertension; History of kidney cancer; Chest pain; Dyspnea on exertion; Anemia; Routine general medical examination at a health care facility; Pulmonary fibrosis; Lung nodules; Left groin pain; Positive ANA (antinuclear  antibody); Knee pain, right; Radicular low back pain; Difficulty walking; Bilateral leg weakness; Bence Jones proteinuria; and Cystitis on her problem list.    ALLERGIES:  is allergic to morphine.  MEDICATIONS: has a current medication list which includes the following prescription(s): amlodipine, aspirin, atenolol, budesonide-formoterol, calcitriol, fluoxetine, furosemide, hydralazine, hydralazine, levothyroxine, levothyroxine, lovastatin, meclizine, multivitamin with minerals, spironolactone, and fluticasone.  SURGICAL HISTORY:  Past Surgical History  Procedure Laterality Date  . Tubal ligation  1983    Bilateral  . Nephrectomy      Right; secondary to renal cell carcinoma  . Colonoscopy N/A 04/21/2014    Procedure: COLONOSCOPY;  Surgeon: Malissa Hippo, MD;  Location: [facility];  Service: Endoscopy;  Laterality: N/A;  . Colonoscopy N/A 04/21/2014    Procedure: COLONOSCOPY;  Surgeon: Malissa Hippo, MD;  Location: [facility];  Service: Endoscopy;  Laterality: N/A;  1030    FAMILY HISTORY: family history includes Colon cancer in her father; Diabetes in her sister; Heart disease (age of onset: 14) in her mother; Heart failure in her sister; Hypertension in her mother and sister; Lupus in her brother.  SOCIAL HISTORY:  reports that she has never smoked. She has never used smokeless tobacco. She reports that she does not drink alcohol or use illicit drugs.  REVIEW OF SYSTEMS:  Other than that discussed above is noncontributory.  PHYSICAL EXAMINATION: ECOG PERFORMANCE STATUS: 1 - Symptomatic but completely ambulatory  Blood pressure 124/65, pulse 63, temperature 98 F (36.7 C), temperature source Oral, resp. rate 20, weight 243 lb 12.8 oz (110.587 kg).  GENERAL:alert, no distress and comfortable. Morbidly obese. SKIN: skin color, texture, turgor are normal, no  rashes or significant lesions EYES: PERLA; Conjunctiva are pink and non-injected, sclera clear SINUSES: No redness or  tenderness over maxillary or ethmoid sinuses. Bilateral rhinorrhea. OROPHARYNX:no exudate, no erythema on lips, buccal mucosa, or tongue. Posterior pharyngeal exudate. NECK: supple, thyroid normal size, non-tender, without nodularity. No masses CHEST: No breast masses. Increased AP diameter. LYMPH:  no palpable lymphadenopathy in the cervical, axillary or inguinal LUNGS: clear to auscultation and percussion with normal breathing effort HEART: regular rate & rhythm and no murmurs. ABDOMEN:abdomen soft, non-tender and normal bowel sounds MUSCULOSKELETAL:no cyanosis of digits and no clubbing. Range of motion normal.  NEURO: alert & oriented x 3 with fluent speech, no focal motor/sensory deficits   LABORATORY DATA: Hospital Outpatient Visit on 08/15/2014  Component Date Value Ref Range Status  . aPTT 08/15/2014 40* 24 - 37 seconds Final   Comment:                                 IF BASELINE aPTT IS ELEVATED,                          SUGGEST PATIENT RISK ASSESSMENT                          BE USED TO DETERMINE APPROPRIATE                          ANTICOAGULANT THERAPY.  . WBC 08/15/2014 7.1  4.0 - 10.5 K/uL Final  . RBC 08/15/2014 3.43* 3.87 - 5.11 MIL/uL Final  . Hemoglobin 08/15/2014 10.9* 12.0 - 15.0 g/dL Final  . HCT 16/09/9603 30.6* 36.0 - 46.0 % Final  . MCV 08/15/2014 89.2  78.0 - 100.0 fL Final  . MCH 08/15/2014 31.8  26.0 - 34.0 pg Final  . MCHC 08/15/2014 35.6  30.0 - 36.0 g/dL Final  . RDW 54/08/8118 16.2* 11.5 - 15.5 % Final  . Platelets 08/15/2014 272  150 - 400 K/uL Final  . Prothrombin Time 08/15/2014 14.4  11.6 - 15.2 seconds Final  . INR 08/15/2014 1.12  0.00 - 1.49 Final  . Bone Marrow Exam 08/15/2014 SEE PATHOLOGY REPORT [id number]   Final  . Chromosome-Routine 08/15/2014 SEE SEPARATE REPORT   Final   Performed at [facility]  Orders Only on 08/09/2014  Component Date Value Ref Range Status  . Immunofixation, Urine 08/08/2014 (NOTE)   Final   Comment:  Biclonal gammophthy with IgG and kappa proteins present.                          Reviewed by Dallas Breeding, MD, PhD, FCAP (Electronic Signature on                          File)                          Performed at [facility]    PATHOLOGY: Cytogenetics normal  for ALISANDRA, KROLIKOWSKI ([id number]) Patient: ALZIE, MATHENY Collected: 08/15/2014 Client: [facility] Accession: [id number] Received: 08/15/2014 Malachy Moan, MD DOB: Aug 03, 1946 Age: 54 Gender: F Reported: 08/17/2014 [address] Patient Ph: [phone] MRN #: [id number] [place], [place] [address] Visit #: [id number] Chart #: Phone: [phone] Fax:  CC: Alla German, MD BONE MARROW REPORT FINAL DIAGNOSIS Diagnosis Bone Marrow, Aspirate,Biopsy, and Clot, right iliac - HYPERCELLULAR BONE MARROW FOR AGE WITH TRILINEAGE HEMATOPOIESIS. - PLASMACYTOSIS (PLASMA CELLS 20%). - SEE COMMENT. PERIPHERAL BLOOD: - NORMOCYTIC-NORMOCHROMIC ANEMIA. Diagnosis Note The plasma cells are increased in number representing 20% of all cells in the sample associated with variably sized but predominantly small clusters. Immunohistochemical stains show background staining with kappa and lambda light chains which hinder overall assessment. Nonetheless, there appears to be kappa light chain excess in plasma cells strongly suggestive of plasma cell dyscrasia/neoplasm. There is trilineage hematopoiesis in the background with nonspecific changes. Clinical correlation is recommended. (BNS:ecj/gt 08/16/2014) Guerry Bruin MD Pathologist, Electronic Signature (Case signed 08/17/2014) GROSS AND MICROSCOPIC INFORMATION Specimen Clinical Information paraproteinemia (kp) Source Bone Marrow, Aspirate,Biopsy, and Clot, right iliac Microscopic LAB DATA: CBC performed on 08/15/2014 shows: WBC 7.1 K/ul Neutrophils 60% HB 10.9 g/dl Lymphocytes 16% HCT 10.9 % Monocytes 4% 1 of 3 FINAL for Hoskinson, Lajuanda L  ([id number]) Microscopic(continued) MCV 89.2 fL Eosinophils 2% RDW 16.2 % Basophils 0% PLT 272 K/ul PERIPHERAL BLOOD SMEAR: The red blood cells display mild anisopoikilocytosis with moderate polychromasia. The white blood cells are normal in number with no significant morphologic abnormalities. The platelets are normal in number. BONE MARROW ASPIRATE: Limited material. Erythroid precursors: Progressive maturation with only scattered cells displaying nuclear cytoplasmic dyssynchrony. Granulocytic precursors: Orderly and progressive maturation. Megakaryocytes: Abundant with predominantly normal morphology. Lymphocytes/plasma cells: The plasma cells are increased in number representing 20% of all cells. Large lymphoid aggregates are not present. TOUCH PREPARATIONS: Scanty hematopoietic elements present. CLOT and BIOPSY: The sections show 40 to 70% cellularity with a mixture of myeloid cell types. Scattered small clusters of plasma cells are present. Large clusters or sheets of plasma cells are not identified. Significant lymphoid aggregates are not present. Immunohistochemical stains for CD138, kappa and lambda were performed on block 1B with appropriate controls. CD138 highlights the increased plasma cell component in the bone marrow in the form of interstitial infiltrates and numerous small clusters. Kappa and lambda stains show background staining which hinders overall assessment. There appears to be kappa light chain excess in plasma cells. (BNS:gt, 08/17/14) IRON STAIN: Iron stains are performed on a bone marrow aspirate smear and section of clot. The controls stained appropriately. Storage Iron: Difficult to assess due to limited aspirate material. Ringed Sideroblasts: Absent in scattered erythroid precursors evaluated. ADDITIONAL DATA / TESTING: The specimen was sent for cytogenetic analysis and a separate report will follow. Serum protein electrophoresis performed on 07/04/2014, shows a  restricted band consistent with monoclonal protein accounting for 2.43 g/dL of the total 1.19 g of protein in the gamma region. Immunofixation electrophoresis shows a monoclonal IgG kappa protein. (BNS:ecj 08/16/2014) Specimen Table Bone Marrow count performed on 500 cells shows: Blasts: 2% Myeloid 49% Promyelocyts: 0% Myelocytes: 7% Erythroid 17% Metamyelocyts: 1% Bands: 7% Lymphocytes: 12% Neutrophils: 30% Eosinophils: 2% Plasma Cells: 20% Basophils: 0% Monocytes: 2% M:E ratio: 2.87 2 of 3 FINAL for Boot, Bennie L ([id number]) Gross Received in [facility] is a scant amount of dark red soft material, submitted in block A. Also received in [facility] in a separate container is a 3.2 cm in length core of tan-red to dark red focally firm tissue/material, ranging from 0.2 to 0.4 cm in diameter, bisected and submitted in block B following decalcification. (SSW:ecj 08/15/2014) Stain(s) Used in Diagnosis The following stain(s) were used in diagnosing the case: LAMBDA, KAPPA, CD 138, Iron Stain. The control(s) stained appropriately. Report  signed out from following location(s) Technical Component and Interpretation performed at [facility] [address], [place], [place]    Urinalysis    Component Value Date/Time   COLORURINE YELLOW 04/20/2014 0743   APPEARANCEUR CLEAR 04/20/2014 0743   LABSPEC 1.025 04/20/2014 0743   PHURINE 5.5 04/20/2014 0743   GLUCOSEU NEGATIVE 04/20/2014 0743   GLUCOSEU NEG mg/dL 8/65/7846 9629   HGBUR NEGATIVE 04/20/2014 0743   HGBUR large 05/18/2010 1003   BILIRUBINUR neg 05/04/2014 1133   BILIRUBINUR NEGATIVE 04/20/2014 0743   KETONESUR NEGATIVE 04/20/2014 0743   PROTEINUR neg 05/04/2014 1133   PROTEINUR NEGATIVE 04/20/2014 0743   UROBILINOGEN 0.2 05/04/2014 1133   UROBILINOGEN 0.2 04/20/2014 0743   NITRITE neg 05/04/2014 1133   NITRITE NEGATIVE 04/20/2014 0743   LEUKOCYTESUR Negative 05/04/2014 1133    RADIOGRAPHIC STUDIES: Ct  Biopsy  08/15/2014   CLINICAL DATA:  68 year old female with monoclonal gammopathy of uncertain significance. Bone marrow biopsy is warranted to evaluate for multiple myeloma.  EXAM: CT BIOPSY  Date: 08/15/2014  PROCEDURE: 1. CT guided bone marrow aspiration and core biopsy Interventional Radiologist:  Sterling Big, MD  ANESTHESIA/SEDATION: Moderate (conscious) sedation was used. 3 mg Versed, 50 mcg Fentanyl were administered intravenously. The patient's vital signs were monitored continuously by radiology nursing throughout the procedure.  Sedation Time: 9 minutes  TECHNIQUE: Informed consent was obtained from the patient following explanation of the procedure, risks, benefits and alternatives. The patient understands, agrees and consents for the procedure. All questions were addressed. A time out was performed.  The patient was positioned prone and noncontrast localization CT was performed of the pelvis to demonstrate the iliac marrow spaces.  Maximal barrier sterile technique utilized including caps, mask, sterile gowns, sterile gloves, large sterile drape, hand hygiene, and betadine prep.  Under sterile conditions and local anesthesia, an 11 gauge coaxial bone biopsy needle was advanced into the right iliac marrow space. Needle position was confirmed with CT imaging. Initially, bone marrow aspiration was performed. Next, the 11 gauge outer cannula was utilized to obtain a right iliac bone marrow core biopsy using the On-Control drill device. Needle was removed. Hemostasis was obtained with compression. The patient tolerated the procedure well. Samples were prepared with the cytotechnologist. No immediate complications.  IMPRESSION: CT guided right iliac bone marrow aspiration and core biopsy using the On-Control device.  Signed,  Sterling Big, MD  [facility]  [facility]   Electronically Signed   By: Malachy Moan M.D.   On: 08/15/2014 12:21     ASSESSMENT:  #1. 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 #2. Chronic kidney disease. #3. History of right renal cell carcinoma, status post resection with no postop therapy, surgery performed in December of 2008, no evidence of disease. #4. Hypertension, on treatment, controlled. #5. Hypothyroidism, on treatment. #6. Hyperlipidemia, on treatment. #7. Depression, on treatment. #8. Chronic obstructive pulmonary disease, on treatment. #9. Symptomatic allergic rhinitis.   PLAN:  #1. Information was given to the patient about multiple myeloma and a treatment. #2. Plan is to treat with Revlimid/Velcade/dexamethasone with likelihood of over 90% response rate. #3. Zometa monthly will be added to lessen the probability of skeletal metastases. #4. Appointment with nurse navigator to discuss treatment while her daughter is in attendance. #5. Anticipate beginning treatment on 09/05/2014. #6. Nettie pot was ordered along with Flonase inhaler.   All questions were answered. The patient knows to call the clinic with any problems, questions or concerns. We  can certainly see the patient much sooner if necessary.   I spent 40 minutes counseling the patient face to face. The total time spent in the appointment was 55 minutes.    Maurilio Lovely, MD 08/26/2014 12:53 PM  DISCLAIMER:  This note was dictated with voice recognition software.  Similar sounding words can inadvertently be transcribed inaccurately and may not be corrected upon review.

## 2014-08-29 DIAGNOSIS — D472 Monoclonal gammopathy: Secondary | ICD-10-CM | POA: Insufficient documentation

## 2014-08-29 DIAGNOSIS — C9000 Multiple myeloma not having achieved remission: Secondary | ICD-10-CM | POA: Insufficient documentation

## 2014-08-29 MED ORDER — PROCHLORPERAZINE MALEATE 10 MG PO TABS: 10.0000 mg | ORAL_TABLET | Freq: Four times a day (QID) | ORAL | Status: DC | PRN

## 2014-08-29 MED ORDER — DEXAMETHASONE 4 MG PO TABS: ORAL_TABLET | ORAL | Status: DC

## 2014-08-29 NOTE — Patient Instructions (Addendum)
[place]   CHEMOTHERAPY INSTRUCTIONS   Revlimid -  Chemo med - side effects: neutropenia (low white blood cells), thrombocytopenia (low platelets), deep vein thrombosis (blood clot in extremity), pulmonary embolism (blood clot in lung), itching, rash, dry skin, diarrhea, constipation, nausea, vomiting, fatigue, dizziness, headache, muscle cramps/aches, inflammation of the nose and throat, fever, upper respiratory tract infections.  This is a pill. You will be responsible for taking this medication (Revlimid 25mg  capsule) daily x 14 days. Then you be off of the medication for 7 days. Repeat this dosage. REVLIMID should be taken orally at about the same time each day, either with or without food. REVLIMID capsules should be swallowed whole with water. The capsules should not be opened, broken, or chewed.  Patients should be instructed to seek medical care if they develop symptoms such as shortness of breath, chest pain, or arm or leg swelling.  Dexamethasone:  This medication is not chemo but is being given along with chemo. Dexamethasone is a steroid. Dexamethasone is useful in myeloma treatment because it can stop white blood cells from traveling to areas where cancerous myeloma cells are causing damage. This decreases the amount of swelling or inflammation in those areas and relieves associated pain and pressure. More importantly, in high doses, dexamethasone can actually kill myeloma cells. When combined with other myeloma drugs, it can also make those drugs work even better.   You will take this medication (Dexamethasone 10mg ) the day before and the day after taking your Velcade injections. (refer to calendar)   Velcade:   Velcade - You will take this on Day 1, 4, 8, 11 every 21 days here at the [facility]. This will be an injection in your abdominal tissue. Side Effects: peripheral neuropathy, (numbness, tingling, burning in hands/fingers/feet/toes),  hypotension (low blood pressure), nausea, vomiting, diarrhea, blurred vision, fatigue, bone marrow suppression (low white blood cells-fight infection, low red blood cells- this makes up your hemoglobin & circulating blood volume, low platelets-this is what helps your blood to clot)   Zometa: This medication is being given to protect your bones from the multiple myeloma. Multiple myeloma is a cancer of the bones/bone marrow and therefore you are at risk of having bone destruction from the cancer. This medication works to strengthen/protect your bones from fractures/breaks. Side effect of this medication: can lower calcium level in blood.   This medication will be given via IV once a month (every 28 days).   You may take the flu shot prior to starting chemo. We can give it to you here if you would like.   POTENTIAL SIDE EFFECTS OF TREATMENT: Increased Susceptibility to Infection, Vomiting, Constipation, Hair Thinning, Changes in Character of Skin and Nails (brittleness, dryness,etc.), Bone Marrow Suppression, Abdominal Cramping, Nausea, Diarrhea, Sun Sensitivity and Mouth Sores    EDUCATIONAL MATERIALS GIVEN AND REVIEWED: Chemotherapy and You  Specific Instructions Sheets: Revlimid, Velcade, Dexamethasone, Zometa, Compazine, Acyclovir, Multiple Myeloma   SELF CARE ACTIVITIES WHILE ON CHEMOTHERAPY: Increase your fluid intake 48 hours prior to treatment and drink at least 2 quarts per day after treatment., No alcohol intake., No aspirin or other medications unless approved by your oncologist., Eat foods that are light and easy to digest., Eat foods at cold or room temperature., No fried, fatty, or spicy foods immediately before or after treatment., Have teeth cleaned professionally before starting treatment. Keep dentures and partial plates clean., Use soft toothbrush and do not use mouthwashes that contain alcohol. Biotene is a  good mouthwash that is available at most pharmacies or may be ordered by  calling [phone]., Use warm salt water gargles (1 teaspoon salt per 1 quart warm water) before and after meals and at bedtime. Or you may rinse with 2 tablespoons of three -percent hydrogen peroxide mixed in eight ounces of water., Always use sunscreen with SPF (Sun Protection Factor) of 30 or higher., Use your nausea medication as directed to prevent nausea., Use your stool softener or laxative as directed to prevent constipation. and Use your anti-diarrheal medication as directed to stop diarrhea.  Please wash your hands for at least 30 seconds using warm soapy water. Handwashing is the #1 way to prevent the spread of germs. Stay away from sick people or people who are getting over a cold. If you develop respiratory systems such as green/yellow mucus production or productive cough or persistent cough let us know and we will see if you need an antibiotic. It is a good idea to keep a pair of gloves on when going into grocery stores/[employer] to decrease your risk of coming into contact with germs on the carts, etc. Carry alcohol hand gel with you at all times and use it frequently if out in public. All foods need to be cooked thoroughly. No raw foods. No medium or undercooked meats, eggs. If your food is cooked medium well, it does not need to be hot pink or saturated with bloody liquid at all. Vegetables and fruits need to be washed/rinsed under the faucet with a dish detergent before being consumed. You can eat raw fruits and vegetables unless we tell you otherwise but it would be best if you cooked them or bought frozen. Do not eat off of salad bars or hot bars unless you really trust the cleanliness of the restaurant. If you need dental work, please let us know before you go for your appointment so that we can coordinate the best possible time for you in regards to your chemo regimen. You need to also let your dentist know that you are actively taking chemo. We may need to do labs prior to your dental  appointment. We also want your bowels moving at least every other day. If this is not happening, we need to know so that we can get you on a bowel regimen to help you go.     MEDICATIONS: You have been given prescriptions for the following medications:  Compazine/Prochlorperazine 10mg  tablet. May take 1 tablet every 6 hours if needed for nausea/vomiting.   Dexamethasone 4mg  tablet. Take 2.5 tablets (10mg  total) the days before and the days after Velcade injection.   Acyclovir 400mg  tablet. Take 1 tablet daily. Before this runs out - get it refilled. This is an anti-viral drug. This will help protect you from certain viruses while taking your chemo treatments.   Revlimid 25mg  capsule. Take 1 capsule daily with or without food x 14 days, rest for 7 days (do not take), then repeat.    Over-the-Counter Meds:  Colace - this is a stool softener. Take 100mg  capsule 2-6 times a day as needed. If you have to take more than 6 capsules of Colace a day call the [facility].  Senna - this is a mild laxative used to treat mild constipation. May take 2 tabs by mouth daily or up to twice a day as needed for mild constipation.  Milk of Magnesia - this is a laxative used to treat moderate to severe constipation. May take 2-4 tablespoons every  8 hours as needed. May increase to 8 tablespoons x 1 dose and if no bowel movement call the Cancer Center.  Imodium - this is for diarrhea. Take 2 tabs after 1st loose stool and then 1 tab after each loose stool until you go a total of 12 hours without a loose stool. Call Cancer Center if loose stools continue.   SYMPTOMS TO REPORT AS SOON AS POSSIBLE AFTER TREATMENT:  FEVER GREATER THAN 100.5 F  CHILLS WITH OR WITHOUT FEVER  NAUSEA AND VOMITING THAT IS NOT CONTROLLED WITH YOUR NAUSEA MEDICATION  UNUSUAL SHORTNESS OF BREATH  UNUSUAL BRUISING OR BLEEDING  TENDERNESS IN MOUTH AND THROAT WITH OR WITHOUT PRESENCE OF ULCERS  URINARY PROBLEMS  BOWEL  PROBLEMS  UNUSUAL RASH    Wear comfortable clothing and clothing appropriate for easy access to any Portacath or PICC line. Let us know if there is anything that we can do to make your therapy better!      I have been informed and understand all of the instructions given to me and have received a copy. I have been instructed to call the clinic [phone] or my family physician as soon as possible for continued medical care, if indicated. I do not have any more questions at this time but understand that I may call the Cancer Center or the Patient Navigator at [phone] during office hours should I have questions or need assistance in obtaining follow-up care.          Lenalidomide Oral Capsules What is this medicine? LENALIDOMIDE (len a LID oh mide) is a chemotherapy drug that targets specific proteins within cancer cells and stops the cancer cell from growing. It is used to treat multiple myeloma, mantle cell lymphoma, and some myelodysplastic syndromes that cause severe anemia requiring blood transfusions. This medicine may be used for other purposes; ask your health care provider or pharmacist if you have questions. COMMON BRAND NAME(S): Revlimid What should I tell my health care provider before I take this medicine? They need to know if you have any of these conditions: -blood clots in the legs or the lungs -high blood pressure -high cholesterol -infection -irregular monthly periods or menstrual cycles -kidney disease -liver disease -smoke tobacco -thyroid disease -an unusual or allergic reaction to lenalidomide, other medicines, foods, dyes, or preservatives -pregnant or trying to get pregnant -breast-feeding How should I use this medicine? Take this medicine by mouth with a glass of water. Follow the directions on the prescription label. Do not cut, crush, or chew this medicine. Take your medicine at regular intervals. Do not take it more often than directed. Do  not stop taking except on your doctor's advice. A MedGuide will be given with each prescription and refill. Read this guide carefully each time. The MedGuide may change frequently. Talk to your pediatrician regarding the use of this medicine in children. Special care may be needed. Overdosage: If you think you have taken too much of this medicine contact a poison control center or emergency room at once. NOTE: This medicine is only for you. Do not share this medicine with others. What if I miss a dose? If you miss a dose, take it as soon as you can. If your next dose is to be taken in less than 12 hours, then do not take the missed dose. Take the next dose at your regular time. Do not take double or extra doses. What may interact with this medicine? This medicine may interact with the  following medications: -digoxin -medicines that increase the risk of thrombosis like estrogens or erythropoietic agents (e.g., epoetin alfa and darbepoetin alfa) -warfarin This list may not describe all possible interactions. Give your health care provider a list of all the medicines, herbs, non-prescription drugs, or dietary supplements you use. Also tell them if you smoke, drink alcohol, or use illegal drugs. Some items may interact with your medicine. What should I watch for while using this medicine? Visit your doctor for regular check ups. Tell your doctor or healthcare professional if your symptoms do not start to get better or if they get worse. You will need to have important blood work done while you are taking this medicine. This medicine is available only through a special program. Doctors, pharmacies, and patients must meet all of the conditions of the program. Your health care provider will help you get signed up with the program if you need this medicine. Through the program you will only receive up to a 28 day supply of the medicine at one time. You will need a new prescription for each refill. This  medicine can cause birth defects. Do not get pregnant while taking this drug. Females with child-bearing potential will need to have 2 negative pregnancy tests before starting this medicine. Pregnancy testing must be done every 2 to 4 weeks as directed while taking this medicine. Use 2 reliable forms of birth control together while you are taking this medicine and for 1 month after you stop taking this medicine. If you think that you might be pregnant talk to your doctor right away. Men must use a latex condom during sexual contact with a woman while taking this medicine and for 28 days after you stop taking this medicine. A latex condom is needed even if you have had a vasectomy. Contact your doctor right away if your partner becomes pregnant. Do not donate sperm while taking this medicine and for 28 days after you stop taking this medicine. Do not give blood while taking the medicine and for 1 month after completion of treatment to avoid exposing pregnant women to the medicine through the donated blood. Talk to your doctor about your risk of cancer. You may be more at risk for certain types of cancers if you take this medicine. What side effects may I notice from receiving this medicine? Side effects that you should report to your doctor or health care professional as soon as possible: -allergic reactions like skin rash, itching or hives, swelling of the face, lips, or tongue -breathing problems -chest pain or tightness -fast, irregular heartbeat -low blood counts - this medicine may decrease the number of white blood cells, red blood cells and platelets. You may be at increased risk for infections and bleeding. -seizures -signs and symptoms of bleeding such as bloody or black, tarry stools; red or dark-brown urine; spitting up blood or brown material that looks like coffee grounds; red spots on the skin; unusual bruising or bleeding from the eye, gums, or nose -signs and symptoms of a blood clot such  as breathing problems; changes in vision; chest pain; severe, sudden headache; pain, swelling, warmth in the leg; trouble speaking; sudden numbness or weakness of the face, arm or leg -signs and symptoms of liver injury like dark yellow or brown urine; general ill feeling or flu-like symptoms; light-colored stools; loss of appetite; nausea; right upper belly pain; unusually weak or tired; yellowing of the eyes or skin -signs and symptoms of a stroke like changes in vision;  confusion; trouble speaking or understanding; severe headaches; sudden numbness or weakness of the face, arm or leg; trouble walking; dizziness; loss of balance or coordination -sweating -vomiting Side effects that usually do not require medical attention (report to your doctor or health care professional if they continue or are bothersome): -constipation -cough -diarrhea -tiredness This list may not describe all possible side effects. Call your doctor for medical advice about side effects. You may report side effects to FDA at 1-800-FDA-1088. Where should I keep my medicine? Keep out of the reach of children. Store at room temperature between 15 and 30 degrees C (59 and 86 degrees F). Throw away any unused medicine after the expiration date. NOTE: This sheet is a summary. It may not cover all possible information. If you have questions about this medicine, talk to your doctor, pharmacist, or health care provider.  2015, [employer]vier/Gold Standard. (2014-03-15 18:30:01) Bortezomib injection What is this medicine? BORTEZOMIB (bor TEZ oh mib) is a chemotherapy drug. It slows the growth of cancer cells. This medicine is used to treat multiple myeloma, and certain lymphomas, such as mantle-cell lymphoma. This medicine may be used for other purposes; ask your health care provider or pharmacist if you have questions. COMMON BRAND NAME(S): Velcade What should I tell my health care provider before I take this medicine? They need to know  if you have any of these conditions: -diabetes -heart disease -irregular heartbeat -liver disease -on hemodialysis -low blood counts, like low white blood cells, platelets, or hemoglobin -peripheral neuropathy -taking medicine for blood pressure -an unusual or allergic reaction to bortezomib, mannitol, boron, other medicines, foods, dyes, or preservatives -pregnant or trying to get pregnant -breast-feeding How should I use this medicine? This medicine is for injection into a vein or for injection under the skin. It is given by a health care professional in a hospital or clinic setting. Talk to your pediatrician regarding the use of this medicine in children. Special care may be needed. Overdosage: If you think you have taken too much of this medicine contact a poison control center or emergency room at once. NOTE: This medicine is only for you. Do not share this medicine with others. What if I miss a dose? It is important not to miss your dose. Call your doctor or health care professional if you are unable to keep an appointment. What may interact with this medicine? This medicine may interact with the following medications: -ketoconazole -rifampin -ritonavir -St. John's Wort This list may not describe all possible interactions. Give your health care provider a list of all the medicines, herbs, non-prescription drugs, or dietary supplements you use. Also tell them if you smoke, drink alcohol, or use illegal drugs. Some items may interact with your medicine. What should I watch for while using this medicine? Visit your doctor for checks on your progress. This drug may make you feel generally unwell. This is not uncommon, as chemotherapy can affect healthy cells as well as cancer cells. Report any side effects. Continue your course of treatment even though you feel ill unless your doctor tells you to stop. You may get drowsy or dizzy. Do not drive, use machinery, or do anything that needs  mental alertness until you know how this medicine affects you. Do not stand or sit up quickly, especially if you are an older patient. This reduces the risk of dizzy or fainting spells. In some cases, you may be given additional medicines to help with side effects. Follow all directions for their use.  Call your doctor or health care professional for advice if you get a fever, chills or sore throat, or other symptoms of a cold or flu. Do not treat yourself. This drug decreases your body's ability to fight infections. Try to avoid being around people who are sick. This medicine may increase your risk to bruise or bleed. Call your doctor or health care professional if you notice any unusual bleeding. You may need blood work done while you are taking this medicine. In some patients, this medicine may cause a serious brain infection that may cause death. If you have any problems seeing, thinking, speaking, walking, or standing, tell your doctor right away. If you cannot reach your doctor, urgently seek other source of medical care. Do not become pregnant while taking this medicine. Women should inform their doctor if they wish to become pregnant or think they might be pregnant. There is a potential for serious side effects to an unborn child. Talk to your health care professional or pharmacist for more information. Do not breast-feed an infant while taking this medicine. Check with your doctor or health care professional if you get an attack of severe diarrhea, nausea and vomiting, or if you sweat a lot. The loss of too much body fluid can make it dangerous for you to take this medicine. What side effects may I notice from receiving this medicine? Side effects that you should report to your doctor or health care professional as soon as possible: -allergic reactions like skin rash, itching or hives, swelling of the face, lips, or tongue -breathing problems -changes in hearing -changes in vision -fast,  irregular heartbeat -feeling faint or lightheaded, falls -pain, tingling, numbness in the hands or feet -right upper belly pain -seizures -swelling of the ankles, feet, hands -unusual bleeding or bruising -unusually weak or tired -vomiting -yellowing of the eyes or skin Side effects that usually do not require medical attention (report to your doctor or health care professional if they continue or are bothersome): -changes in emotions or moods -constipation -diarrhea -loss of appetite -headache -irritation at site where injected -nausea This list may not describe all possible side effects. Call your doctor for medical advice about side effects. You may report side effects to FDA at 1-800-FDA-1088. Where should I keep my medicine? This drug is given in a hospital or clinic and will not be stored at home. NOTE: This sheet is a summary. It may not cover all possible information. If you have questions about this medicine, talk to your doctor, pharmacist, or health care provider.  2015, [employer]/Gold Standard. (2013-10-04 12:46:32) Dexamethasone tablets What is this medicine? DEXAMETHASONE (dex a METH a sone) is a corticosteroid. It is commonly used to treat inflammation of the skin, joints, lungs, and other organs. Common conditions treated include asthma, allergies, and arthritis. It is also used for other conditions, such as blood disorders and diseases of the adrenal glands. This medicine may be used for other purposes; ask your health care provider or pharmacist if you have questions. COMMON BRAND NAME(S): Decadron, DexPak Dorothea Ogle, DexPak TaperPak, Zema-Pak What should I tell my health care provider before I take this medicine? They need to know if you have any of these conditions: -Cushing's syndrome -diabetes -glaucoma -heart problems or disease -high blood pressure -infection like herpes, measles, tuberculosis, or chickenpox -kidney disease -liver disease -mental  problems -myasthenia gravis -osteoporosis -previous heart attack -seizures -stomach, ulcer or intestine disease including colitis and diverticulitis -thyroid problem -an unusual or allergic reaction to  dexamethasone, corticosteroids, other medicines, lactose, foods, dyes, or preservatives -pregnant or trying to get pregnant -breast-feeding How should I use this medicine? Take this medicine by mouth with a drink of water. Follow the directions on the prescription label. Take it with food or milk to avoid stomach upset. If you are taking this medicine once a day, take it in the morning. Do not take more medicine than you are told to take. Do not suddenly stop taking your medicine because you may develop a severe reaction. Your doctor will tell you how much medicine to take. If your doctor wants you to stop the medicine, the dose may be slowly lowered over time to avoid any side effects. Talk to your pediatrician regarding the use of this medicine in children. Special care may be needed. Patients over 10 years old may have a stronger reaction and need a smaller dose. Overdosage: If you think you have taken too much of this medicine contact a poison control center or emergency room at once. NOTE: This medicine is only for you. Do not share this medicine with others. What if I miss a dose? If you miss a dose, take it as soon as you can. If it is almost time for your next dose, talk to your doctor or health care professional. You may need to miss a dose or take an extra dose. Do not take double or extra doses without advice. What may interact with this medicine? Do not take this medicine with any of the following medications: -mifepristone, RU-486 -vaccines This medicine may also interact with the following medications: -amphotericin B -antibiotics like clarithromycin, erythromycin, and troleandomycin -aspirin and aspirin-like drugs -barbiturates like  phenobarbital -carbamazepine -cholestyramine -cholinesterase inhibitors like donepezil, galantamine, rivastigmine, and tacrine -cyclosporine -digoxin -diuretics -ephedrine -female hormones, like estrogens or progestins and birth control pills -indinavir -isoniazid -ketoconazole -medicines for diabetes -medicines that improve muscle tone or strength for conditions like myasthenia gravis -NSAIDs, medicines for pain and inflammation, like ibuprofen or naproxen -phenytoin -rifampin -thalidomide -warfarin This list may not describe all possible interactions. Give your health care provider a list of all the medicines, herbs, non-prescription drugs, or dietary supplements you use. Also tell them if you smoke, drink alcohol, or use illegal drugs. Some items may interact with your medicine. What should I watch for while using this medicine? Visit your doctor or health care professional for regular checks on your progress. If you are taking this medicine over a prolonged period, carry an identification card with your name and address, the type and dose of your medicine, and your doctor's name and address. This medicine may increase your risk of getting an infection. Stay away from people who are sick. Tell your doctor or health care professional if you are around anyone with measles or chickenpox. If you are going to have surgery, tell your doctor or health care professional that you have taken this medicine within the last twelve months. Ask your doctor or health care professional about your diet. You may need to lower the amount of salt you eat. The medicine can increase your blood sugar. If you are a diabetic check with your doctor if you need help adjusting the dose of your diabetic medicine. What side effects may I notice from receiving this medicine? Side effects that you should report to your doctor or health care professional as soon as possible: -allergic reactions like skin rash, itching  or hives, swelling of the face, lips, or tongue -changes in vision -fever, sore throat,  sneezing, cough, or other signs of infection, wounds that will not heal -increased thirst -mental depression, mood swings, mistaken feelings of self importance or of being mistreated -pain in hips, back, ribs, arms, shoulders, or legs -redness, blistering, peeling or loosening of the skin, including inside the mouth -trouble passing urine or change in the amount of urine -swelling of feet or lower legs -unusual bleeding or bruising Side effects that usually do not require medical attention (report to your doctor or health care professional if they continue or are bothersome): -headache -nausea, vomiting -skin problems, acne, thin and shiny skin -weight gain This list may not describe all possible side effects. Call your doctor for medical advice about side effects. You may report side effects to FDA at 1-800-FDA-1088. Where should I keep my medicine? Keep out of the reach of children. Store at room temperature between 20 and 25 degrees C (68 and 77 degrees F). Protect from light. Throw away any unused medicine after the expiration date. NOTE: This sheet is a summary. It may not cover all possible information. If you have questions about this medicine, talk to your doctor, pharmacist, or health care provider.  2015, Elsevier/Gold Standard. (2008-03-31 14:02:13) Zoledronic Acid injection (Hypercalcemia, Oncology) What is this medicine? ZOLEDRONIC ACID (ZOE le dron ik AS id) lowers the amount of calcium loss from bone. It is used to treat too much calcium in your blood from cancer. It is also used to prevent complications of cancer that has spread to the bone. This medicine may be used for other purposes; ask your health care provider or pharmacist if you have questions. COMMON BRAND NAME(S): Zometa What should I tell my health care provider before I take this medicine? They need to know if you have any  of these conditions: -aspirin-sensitive asthma -cancer, especially if you are receiving medicines used to treat cancer -dental disease or wear dentures -infection -kidney disease -receiving corticosteroids like dexamethasone or prednisone -an unusual or allergic reaction to zoledronic acid, other medicines, foods, dyes, or preservatives -pregnant or trying to get pregnant -breast-feeding How should I use this medicine? This medicine is for infusion into a vein. It is given by a health care professional in a hospital or clinic setting. Talk to your pediatrician regarding the use of this medicine in children. Special care may be needed. Overdosage: If you think you have taken too much of this medicine contact a poison control center or emergency room at once. NOTE: This medicine is only for you. Do not share this medicine with others. What if I miss a dose? It is important not to miss your dose. Call your doctor or health care professional if you are unable to keep an appointment. What may interact with this medicine? -certain antibiotics given by injection -NSAIDs, medicines for pain and inflammation, like ibuprofen or naproxen -some diuretics like bumetanide, furosemide -teriparatide -thalidomide This list may not describe all possible interactions. Give your health care provider a list of all the medicines, herbs, non-prescription drugs, or dietary supplements you use. Also tell them if you smoke, drink alcohol, or use illegal drugs. Some items may interact with your medicine. What should I watch for while using this medicine? Visit your doctor or health care professional for regular checkups. It may be some time before you see the benefit from this medicine. Do not stop taking your medicine unless your doctor tells you to. Your doctor may order blood tests or other tests to see how you are doing. Women should inform  their doctor if they wish to become pregnant or think they might be  pregnant. There is a potential for serious side effects to an unborn child. Talk to your health care professional or pharmacist for more information. You should make sure that you get enough calcium and vitamin D while you are taking this medicine. Discuss the foods you eat and the vitamins you take with your health care professional. Some people who take this medicine have severe bone, joint, and/or muscle pain. This medicine may also increase your risk for jaw problems or a broken thigh bone. Tell your doctor right away if you have severe pain in your jaw, bones, joints, or muscles. Tell your doctor if you have any pain that does not go away or that gets worse. Tell your dentist and dental surgeon that you are taking this medicine. You should not have major dental surgery while on this medicine. See your dentist to have a dental exam and fix any dental problems before starting this medicine. Take good care of your teeth while on this medicine. Make sure you see your dentist for regular follow-up appointments. What side effects may I notice from receiving this medicine? Side effects that you should report to your doctor or health care professional as soon as possible: -allergic reactions like skin rash, itching or hives, swelling of the face, lips, or tongue -anxiety, confusion, or depression -breathing problems -changes in vision -eye pain -feeling faint or lightheaded, falls -jaw pain, especially after dental work -mouth sores -muscle cramps, stiffness, or weakness -trouble passing urine or change in the amount of urine Side effects that usually do not require medical attention (report to your doctor or health care professional if they continue or are bothersome): -bone, joint, or muscle pain -constipation -diarrhea -fever -hair loss -irritation at site where injected -loss of appetite -nausea, vomiting -stomach upset -trouble sleeping -trouble swallowing -weak or tired This list may  not describe all possible side effects. Call your doctor for medical advice about side effects. You may report side effects to FDA at 1-800-FDA-1088. Where should I keep my medicine? This drug is given in a hospital or clinic and will not be stored at home. NOTE: This sheet is a summary. It may not cover all possible information. If you have questions about this medicine, talk to your doctor, pharmacist, or health care provider.  2015, Elsevier/Gold Standard. (2013-05-20 13:03:13) Prochlorperazine tablets What is this medicine? PROCHLORPERAZINE (proe klor PER a zeen) helps to control severe nausea and vomiting. This medicine is also used to treat schizophrenia. It can also help patients who experience anxiety that is not due to psychological illness. This medicine may be used for other purposes; ask your health care provider or pharmacist if you have questions. COMMON BRAND NAME(S): Compazine What should I tell my health care provider before I take this medicine? They need to know if you have any of these conditions: -blood disorders or disease -dementia -liver disease or jaundice -Parkinson's disease -uncontrollable movement disorder -an unusual or allergic reaction to prochlorperazine, other medicines, foods, dyes, or preservatives -pregnant or trying to get pregnant -breast-feeding How should I use this medicine? Take this medicine by mouth with a glass of water. Follow the directions on the prescription label. Take your doses at regular intervals. Do not take your medicine more often than directed. Do not stop taking this medicine suddenly. This can cause nausea, vomiting, and dizziness. Ask your doctor or health care professional for advice. Talk to your pediatrician regarding  the use of this medicine in children. Special care may be needed. While this drug may be prescribed for children as young as 2 years for selected conditions, precautions do apply. Overdosage: If you think you have  taken too much of this medicine contact a poison control center or emergency room at once. NOTE: This medicine is only for you. Do not share this medicine with others. What if I miss a dose? If you miss a dose, take it as soon as you can. If it is almost time for your next dose, take only that dose. Do not take double or extra doses. What may interact with this medicine? Do not take this medicine with any of the following medications: -amoxapine -antidepressants like citalopram, escitalopram, fluoxetine, paroxetine, and sertraline -deferoxamine -dofetilide -maprotiline -tricyclic antidepressants like amitriptyline, clomipramine, imipramine, nortiptyline and others This medicine may also interact with the following medications: -lithium -medicines for pain -phenytoin -propranolol -warfarin This list may not describe all possible interactions. Give your health care provider a list of all the medicines, herbs, non-prescription drugs, or dietary supplements you use. Also tell them if you smoke, drink alcohol, or use illegal drugs. Some items may interact with your medicine. What should I watch for while using this medicine? Visit your doctor or health care professional for regular checks on your progress. You may get drowsy or dizzy. Do not drive, use machinery, or do anything that needs mental alertness until you know how this medicine affects you. Do not stand or sit up quickly, especially if you are an older patient. This reduces the risk of dizzy or fainting spells. Alcohol may interfere with the effect of this medicine. Avoid alcoholic drinks. This medicine can reduce the response of your body to heat or cold. Dress warm in cold weather and stay hydrated in hot weather. If possible, avoid extreme temperatures like saunas, hot tubs, very hot or cold showers, or activities that can cause dehydration such as vigorous exercise. This medicine can make you more sensitive to the sun. Keep out of the  sun. If you cannot avoid being in the sun, wear protective clothing and use sunscreen. Do not use sun lamps or tanning beds/booths. Your mouth may get dry. Chewing sugarless gum or sucking hard candy, and drinking plenty of water may help. Contact your doctor if the problem does not go away or is severe. What side effects may I notice from receiving this medicine? Side effects that you should report to your doctor or health care professional as soon as possible: -blurred vision -breast enlargement in men or women -breast milk in women who are not breast-feeding -chest pain, fast or irregular heartbeat -confusion, restlessness -dark yellow or brown urine -difficulty breathing or swallowing -dizziness or fainting spells -drooling, shaking, movement difficulty (shuffling walk) or rigidity -fever, chills, sore throat -involuntary or uncontrollable movements of the eyes, mouth, head, arms, and legs -seizures -stomach area pain -unusually weak or tired -unusual bleeding or bruising -yellowing of skin or eyes Side effects that usually do not require medical attention (report to your doctor or health care professional if they continue or are bothersome): -difficulty passing urine -difficulty sleeping -headache -sexual dysfunction -skin rash, or itching This list may not describe all possible side effects. Call your doctor for medical advice about side effects. You may report side effects to FDA at 1-800-FDA-1088. Where should I keep my medicine? Keep out of the reach of children. Store at room temperature between 15 and 30 degrees C (59 and 86  degrees F). Protect from light. Throw away any unused medicine after the expiration date. NOTE: This sheet is a summary. It may not cover all possible information. If you have questions about this medicine, talk to your doctor, pharmacist, or health care provider.  2015, Elsevier/Gold Standard. (2012-04-28 16:59:39) Acyclovir tablets or capsules What  is this medicine? ACYCLOVIR (ay SYE kloe veer) is an antiviral medicine. It is used to treat or prevent infections caused by certain kinds of viruses. Examples of these infections include herpes and shingles. This medicine will not cure herpes. This medicine may be used for other purposes; ask your health care provider or pharmacist if you have questions. COMMON BRAND NAME(S): Zovirax What should I tell my health care provider before I take this medicine? They need to know if you have any of these conditions: -kidney disease -an unusual or allergic reaction to acyclovir, ganciclovir, valacyclovir, other medicines, foods, dyes, or preservatives -pregnant or trying to get pregnant -breast-feeding How should I use this medicine? Take this medicine by mouth with a glass of water. Follow the directions on the prescription label. You can take it with or without food. Take your medicine at regular intervals. Do not take your medicine more often than directed. Take all of your medicine as directed even if you think your are better. Do not skip doses or stop your medicine early. Talk to your pediatrician regarding the use of this medicine in children. While this drug may be prescribed for selected conditions, precautions do apply. Overdosage: If you think you have taken too much of this medicine contact a poison control center or emergency room at once. NOTE: This medicine is only for you. Do not share this medicine with others. What if I miss a dose? If you miss a dose, take it as soon as you can. If it is almost time for your next dose, take only that dose. Do not take double or extra doses. What may interact with this medicine? -probenecid This list may not describe all possible interactions. Give your health care provider a list of all the medicines, herbs, non-prescription drugs, or dietary supplements you use. Also tell them if you smoke, drink alcohol, or use illegal drugs. Some items may interact  with your medicine. What should I watch for while using this medicine? Tell your doctor or health care professional if your symptoms do not improve. This medicine works best when started very early in the course of an infection. Begin treatment at the first signs of infection. Drink 6 to 8 glasses of water or fluids every day while you are taking this medicine. This will help prevent side effects. You can still pass chickenpox, shingles, or herpes to another person even while you are taking this medicine. Avoid contact with others as directed. Genital herpes is a sexually transmitted disease. Talk to your doctor about how to stop the spread of infection. What side effects may I notice from receiving this medicine? Side effects that you should report to your doctor or health care professional as soon as possible: -allergic reactions like skin rash, itching or hives, swelling of the face, lips, or tongue -chest pain -confusion, hallucinations, tremor -dark urine -increased sensitivity to the sun -redness, blistering, peeling or loosening of the skin, including inside the mouth -seizures -trouble passing urine or change in the amount of urine -unusual bleeding or bruising, or pinpoint red spots on the skin -unusually weak or tired -yellowing of the eyes or skin Side effects that usually do  not require medical attention (report to your doctor or health care professional if they continue or are bothersome): -diarrhea -fever -headache -nausea, vomiting -stomach upset This list may not describe all possible side effects. Call your doctor for medical advice about side effects. You may report side effects to FDA at 1-800-FDA-1088. Where should I keep my medicine? Keep out of the reach of children. Store at room temperature between 15 and 25 degrees C (59 and 77 degrees F). Throw away any unused medicine after the expiration date. NOTE: This sheet is a summary. It may not cover all possible  information. If you have questions about this medicine, talk to your doctor, pharmacist, or health care provider.  2015, Elsevier/Gold Standard. (2008-02-24 13:15:46) Multiple Myeloma Multiple myeloma is the most common cancer of bone. It is caused by the uncontrolled multiplication of a type of white blood cell in the marrow. This white blood cell is called a plasma cell. This means the bone marrow is overworking producing plasma cells. Soon these overproduced cells begin to take up room in the marrow that is needed by other cells. This means that there are soon not enough red or white blood cells or platelets. Not enough red cells mean that the person is anemic. There are not enough red blood cells to carry oxygen around the body. There are not enough white blood cells to fight disease. This causes the person with multiple myeloma to not feel well. There is also bone pain through much of the body. SYMPTOMS  Anemia causes fatigue (tiredness) and weakness.  Back pain is common. This is from fractures (break in bones) caused by damage to the bones of the back.  Lack of white blood cells makes infection more likely.  Bleeding is a common problem from lack of the cells (platelets). Platelets help blood clots form. This may show up as bleeding from any place. Commonly this shows up as bleeding from the nose or gums.  Fractures (bone breaks) are more common anywhere. The back and ribs are the most commonly fractured areas. DIAGNOSIS  This tumor is often suggested by blood tests. Often doing a bone marrow sample makes the diagnosis (learning what is wrong). This is a test performed by taking a small sample of bone with a small needle. This bone often comes from the sternum (breast bone). This sample is sent to a pathologist (a specialist in looking at tissue under a microscope). After looking at the sample under the microscope, the pathologist is able to make a diagnosis of the problem. X-rays may also  show boney changes. TREATMENT   Occasionally, anti-cancer medications may be used with multiple myeloma. Your caregiver can discuss this with you.  Medications can also be given to help with the bone pain.  There is no cure for multiple myeloma. Lifestyle changes can add years of quality living. HOME CARE INSTRUCTIONS  Often there is no specific treatment for multiple myeloma. Most of the treatment consists of adjustments in dietary and living activities. Some of these changes include:  Your dietitian or caregiver helping you with your dietary questions.  Taking iron and vitamins as prescribed by your caregiver.  Eating a well balanced diet.  Staying active, but follow restrictions suggested by your caregiver. Avoiding heavy lifting (more than 10 pounds) and activities that cause increased pain.  Drinking plenty of water.  Using back braces and a cane may help with some of the boney pain. SEEK IMMEDIATE MEDICAL CARE IF:  You develop severe, uncontrolled  boney pain.  You or your family notices confusion, problems with decision-making or inability to stay awake.  You notice increased urination or constipation.  You notice problems holding your water or stool.  You have numbness or loss of control of your extremities (arms/hands or legs/feet). Document Released: 09/03/2001 Document Revised: 03/02/2012 Document Reviewed: 12/04/2008 [facility] Patient Information 2015 [place], [place]. This information is not intended to replace advice given to you by your health care provider. Make sure you discuss any questions you have with your health care provider.

## 2014-08-30 ENCOUNTER — Other Ambulatory Visit (HOSPITAL_COMMUNITY): Payer: Self-pay | Admitting: Hematology and Oncology

## 2014-08-30 MED ORDER — ACYCLOVIR 400 MG PO TABS: 400.0000 mg | ORAL_TABLET | Freq: Every day | ORAL | Status: DC

## 2014-08-30 MED ORDER — LENALIDOMIDE 25 MG PO CAPS: ORAL_CAPSULE | ORAL | Status: DC

## 2014-08-31 ENCOUNTER — Ambulatory Visit (HOSPITAL_COMMUNITY)
Admission: RE | Admit: 2014-08-31 | Discharge: 2014-08-31 | Disposition: A | Discharge status: Home / Self Care | Payer: Medicare Other | Source: Ambulatory Visit | Attending: Otolaryngology | Admitting: Otolaryngology

## 2014-08-31 ENCOUNTER — Encounter (HOSPITAL_BASED_OUTPATIENT_CLINIC_OR_DEPARTMENT_OTHER): Payer: Medicare Other

## 2014-08-31 ENCOUNTER — Telehealth (HOSPITAL_COMMUNITY): Payer: Self-pay | Admitting: *Deleted

## 2014-08-31 DIAGNOSIS — E041 Nontoxic single thyroid nodule: Secondary | ICD-10-CM | POA: Insufficient documentation

## 2014-08-31 DIAGNOSIS — C9000 Multiple myeloma not having achieved remission: Secondary | ICD-10-CM

## 2014-08-31 NOTE — Addendum Note (Signed)
Addended by: Gerhard Perches on: 08/31/2014 12:20 PM   Modules accepted: Orders

## 2014-08-31 NOTE — Telephone Encounter (Signed)
Pt's daughter notified of transportation availability through [facility] $3 roundtrip. [facility] [id number]. [facility] needs a 3 day notice of appt. Patient's daughter also notified that we are going to draw Dr. Florentina Addison labs here @ the [place] on 09/26/14 however the urine test will need to be collected at Dr. Florentina Addison office.

## 2014-08-31 NOTE — Progress Notes (Signed)
Chemo teaching completed with daughter Jonelle Sidle and patient. Patient wants to wait until Monday Sept 14 to sign consent. Zometa consent and Distress screening will be completed also on Sept 14. Flu vaccine to be given on Sept 14 also per Dr. Idamae Lusher orders.

## 2014-09-02 ENCOUNTER — Telehealth (HOSPITAL_COMMUNITY): Payer: Self-pay | Admitting: *Deleted

## 2014-09-04 NOTE — Progress Notes (Signed)
Melissa Nakayama, MD [address], [address] [place] [address]  Multiple myeloma without remission  CURRENT THERAPY:Revlimid (25 mg days 1-14 every 21 days)/Velcade (D1, 4, 8, and 11 every 21 days)/dexamethasone beginning on 09/05/2014  INTERVAL HISTORY: Melissa [name] 68 y.o. 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    Multiple myeloma without remission   08/15/2014 Bone Marrow Biopsy Bone Marrow, Aspirate,Biopsy, and Clot, right iliac - HYPERCELLULAR BONE MARROW FOR AGE WITH TRILINEAGE HEMATOPOIESIS. - PLASMACYTOSIS (PLASMA CELLS 20%).    Chemotherapy Revlimid (25 mg days 1-14 every 21 days)/Velcade (D1, 4, 8, and 11 every 21 days)/dexamethasone    I personally reviewed and went over laboratory results with the patient.  The results are noted within this dictation.  The patient denies any questions about her chemotherapeutic regimen.  It's noted that nurses are having a difficult time ascertaining laboratory work. She will undergo a third attempt today for blood work. I told the nurse of the most important laboratory work is her CBC differential and complete metabolic panel if we are unable to ascertain other laboratory work. If we are unable to get labs today, we will get them on day 4 and she is encouraged to increase her oral intake of water and also utilizes stressful to increase his strength of her arms.  I provided patient education regarding the role of Zometa which will be added for cycle 2, day 1 of her chemotherapeutic regimen. She is given the risks, benefits, alternatives, side effects to this medication. Consent will need to be ascertained in the future. I've given her the mechanism of action in the role of its therapy in her particular case. This may prove to be difficult if IV accesses difficult to ascertain and maintain.  Hematologically, the patient denies any complaints and ROS questioning is  negative.   Past Medical History  Diagnosis Date  . Vertigo, intermittent   . Hypothyroidism   . Hyperlipidemia   . Obesity   . Hypertension     severe and resistant to treatment; 2008-negative left renal angiogram  . Vitiligo   . Degenerative disc disease, lumbar   . Degenerative disc disease, cervical   . Chest pain 2008    2008-normal coronary angiography  . Renal cell carcinoma     Right nephrectomy    has Hypothyroid; VITAMIN D DEFICIENCY; Obesity; Depression; HEARING LOSS; VERTIGO, INTERMITTENT; Vitiligo; Hyperlipidemia; Hypertension; History of kidney cancer; Chest pain; Dyspnea on exertion; Anemia; Routine general medical examination at a health care facility; Pulmonary fibrosis; Lung nodules; Left groin pain; Positive ANA (antinuclear antibody); Knee pain, right; Radicular low back pain; Difficulty walking; Bilateral leg weakness; Bence Jones proteinuria; Cystitis; and Multiple myeloma without remission on her problem list.     is allergic to morphine.  Melissa [name] does not currently have medications on file.  Past Surgical History  Procedure Laterality Date  . Tubal ligation  1983    Bilateral  . Nephrectomy      Right; secondary to renal cell carcinoma  . Colonoscopy N/A 04/21/2014    Procedure: COLONOSCOPY;  Surgeon: Rogene Houston, MD;  Location: [facility];  Service: Endoscopy;  Laterality: N/A;  . Colonoscopy N/A 04/21/2014    Procedure: COLONOSCOPY;  Surgeon: Rogene Houston, MD;  Location: [facility];  Service: Endoscopy;  Laterality: N/A;  1030    Denies any headaches, dizziness, double vision, fevers, chills, night sweats, nausea, vomiting,  diarrhea, constipation, chest pain, heart palpitations, shortness of breath, blood in stool, black tarry stool, urinary pain, urinary burning, urinary frequency, hematuria.   PHYSICAL EXAMINATION  ECOG PERFORMANCE STATUS: 0 - Asymptomatic  There were no vitals filed for this visit.  GENERAL:alert, no distress,  well nourished, well developed, comfortable, cooperative, obese and smiling SKIN: skin color, texture, turgor are normal, no rashes or significant lesions HEAD: Normocephalic, No masses, lesions, tenderness or abnormalities EYES: normal, PERRLA, EOMI, Conjunctiva are pink and non-injected EARS: External ears normal OROPHARYNX:mucous membranes are moist  NECK: supple, no adenopathy, trachea midline LYMPH:  no palpable lymphadenopathy, no hepatosplenomegaly BREAST:not examined LUNGS: clear to auscultation  HEART: regular rate & rhythm ABDOMEN:abdomen soft, non-tender, obese and normal bowel sounds BACK: Back symmetric, no curvature., No CVA tenderness EXTREMITIES:less then 2 second capillary refill, no joint deformities, effusion, or inflammation, no edema, no skin discoloration, no clubbing, no cyanosis  NEURO: alert & oriented x 3 with fluent speech, no focal motor/sensory deficits, gait normal    LABORATORY DATA: CBC    Component Value Date/Time   WBC 7.1 08/15/2014 0800   RBC 3.43* 08/15/2014 0800   RBC 3.70* 07/31/2011 1245   HGB 10.9* 08/15/2014 0800   HCT 30.6* 08/15/2014 0800   PLT 272 08/15/2014 0800   MCV 89.2 08/15/2014 0800   MCH 31.8 08/15/2014 0800   MCHC 35.6 08/15/2014 0800   RDW 16.2* 08/15/2014 0800   LYMPHSABS 1.9 07/22/2014 0941   MONOABS 0.6 07/22/2014 0941   EOSABS 0.1 07/22/2014 0941   BASOSABS 0.0 07/22/2014 0941      Chemistry      Component Value Date/Time   NA 137 07/22/2014 0941   K 4.6 07/22/2014 0941   CL 98 07/22/2014 0941   CO2 27 07/22/2014 0941   BUN 29* 07/22/2014 0941   CREATININE 2.23* 07/22/2014 0941   CREATININE 1.91* 07/21/2014 1103      Component Value Date/Time   CALCIUM 9.9 07/22/2014 0941   ALKPHOS 43 07/22/2014 0941   AST 14 07/22/2014 0941   ALT 12 07/22/2014 0941   BILITOT 0.5 07/22/2014 0941     Results for Melissa [name], Melissa [name] (MRN [id number]) as of 09/04/2014 20:51  Ref. Range 07/22/2014 09:41  LDH Latest Range: 94-250 U/L 134   Results for  Melissa [name], Melissa [name] (MRN [id number]) as of 09/04/2014 20:51  Ref. Range 07/22/2014 09:41  Beta-2 Microglobulin Latest Range: <=2.51 mg/L 12.50 (H)   Results for Melissa [name], Melissa [name] (MRN [id number]) as of 09/04/2014 20:51  Ref. Range 07/22/2014 09:41 07/22/2014 09:41  Total Protein ELP Latest Range: 6.0-8.3 g/dL 9.0 (H)   Albumin ELP Latest Range: 55.8-66.1 %  46.4 (L)  Alpha-1-Globulin Latest Range: 2.9-4.9 %  4.2  Alpha-2-Globulin Latest Range: 7.1-11.8 %  9.3  Beta Globulin Latest Range: 4.7-7.2 %  4.9  Beta 2 Latest Range: 3.2-6.5 %  3.0 (L)  Gamma Globulin Latest Range: 11.1-18.8 %  32.2 (H)  M-SPIKE, % No range found  2.43  SPE Interp. No range found  (NOTE)  Comment No range found  (NOTE)  IgG (Immunoglobin G), Serum Latest Range: 605 667 7856 mg/dL 3100 (H) 3600 (H)  IgA Latest Range: 69-380 mg/dL 98 101  IgM, Serum Latest Range: 52-322 mg/dL 29 (L) 29 (L)  Kappa free light chain Latest Range: 0.33-1.94 mg/dL 7.55 (H)   Lamda free light chains Latest Range: 0.57-2.63 mg/dL 3.43 (H)   Kappa, lamda light chain ratio Latest Range: 0.26-1.65  2.20 (H)  ASSESSMENT:  1. 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 2. Chronic kidney disease.  3. History of right renal cell carcinoma, status post resection with no postop therapy, surgery performed in December of 2008, no evidence of disease.  4. Hypertension, on treatment, controlled.  5. Hypothyroidism, on treatment.  6. Hyperlipidemia, on treatment.  7. Depression, on treatment.  8. Chronic obstructive pulmonary disease, on treatment.  9. Symptomatic allergic rhinitis  Patient Active Problem List   Diagnosis Date Noted  . Multiple myeloma without remission 08/29/2014  . Cystitis 05/08/2014  . 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 Radicular low back pain 04/18/2014  . Difficulty walking 04/18/2014  . Bilateral leg weakness 04/18/2014  . Positive ANA (antinuclear antibody) 03/22/2014  . Knee pain, right 03/22/2014  .  Left groin pain 11/01/2013  . Lung nodules 08/02/2013  . Pulmonary fibrosis 03/29/2013  . Routine general medical examination at a health care facility 01/23/2013  . Anemia 08/13/2012  . Dyspnea on exertion 04/14/2012  . Hyperlipidemia   . Hypertension   . History of kidney cancer   . Chest pain   . Vitiligo 04/01/2011  . HEARING LOSS 07/18/2010  . Depression 05/18/2010  . VITAMIN D DEFICIENCY 10/24/2009  . Hypothyroid 03/31/2008  . Obesity 03/31/2008  . VERTIGO, INTERMITTENT 03/31/2008      PLAN:  1. I personally reviewed and went over laboratory results with the patient.  The results are noted within this dictation. 2. Treatment today as planned: Revlimid (25 mg days 1-14 every 21 days)/Velcade (D1, 4, 8, and 11 every 21 days)/dexamethasone  3. Labs weekly: CBC diff, CMET 4. Labs every 21 weeks: CRP, LDH, B2M, MM panel 5. Will start Zometa with cycle 2 of chemotherapy and we will give very 6 weeks to coordinate with day 1 of every other cycle. 7. Return in 1 week for follow-up   THERAPY PLAN:  Melissa [name] will start induction therapy with Revlimid (25 mg days 1-14 every 21 days)/Velcade (D1, 4, 8, and 11 every 21 days)/dexamethasone   All questions were answered. The patient knows to call the clinic with any problems, questions or concerns. We can certainly see the patient much sooner if necessary.  Patient and plan discussed with Dr. Farrel Gobble and he is in agreement with the aforementioned.   Melissa [name] 09/05/2014

## 2014-09-05 ENCOUNTER — Encounter (HOSPITAL_COMMUNITY): Payer: Medicare Other

## 2014-09-05 ENCOUNTER — Ambulatory Visit (HOSPITAL_COMMUNITY): Payer: Medicare Other | Admitting: Oncology

## 2014-09-05 ENCOUNTER — Encounter (HOSPITAL_BASED_OUTPATIENT_CLINIC_OR_DEPARTMENT_OTHER): Payer: Medicare Other | Admitting: Oncology

## 2014-09-05 ENCOUNTER — Encounter (HOSPITAL_COMMUNITY): Payer: Self-pay

## 2014-09-05 ENCOUNTER — Inpatient Hospital Stay (HOSPITAL_COMMUNITY): Payer: Medicare Other

## 2014-09-05 ENCOUNTER — Encounter (HOSPITAL_BASED_OUTPATIENT_CLINIC_OR_DEPARTMENT_OTHER): Payer: Medicare Other

## 2014-09-05 VITALS — BP 143/58 | HR 64 | Temp 98.4°F | Resp 18 | Wt 244.4 lb

## 2014-09-05 DIAGNOSIS — N189 Chronic kidney disease, unspecified: Secondary | ICD-10-CM

## 2014-09-05 DIAGNOSIS — D892 Hypergammaglobulinemia, unspecified: Secondary | ICD-10-CM | POA: Diagnosis present

## 2014-09-05 DIAGNOSIS — Z23 Encounter for immunization: Secondary | ICD-10-CM

## 2014-09-05 DIAGNOSIS — C9000 Multiple myeloma not having achieved remission: Secondary | ICD-10-CM

## 2014-09-05 DIAGNOSIS — Z85528 Personal history of other malignant neoplasm of kidney: Secondary | ICD-10-CM

## 2014-09-05 DIAGNOSIS — Z5112 Encounter for antineoplastic immunotherapy: Secondary | ICD-10-CM

## 2014-09-05 DIAGNOSIS — I129 Hypertensive chronic kidney disease with stage 1 through stage 4 chronic kidney disease, or unspecified chronic kidney disease: Secondary | ICD-10-CM

## 2014-09-05 DIAGNOSIS — R809 Proteinuria, unspecified: Secondary | ICD-10-CM | POA: Diagnosis present

## 2014-09-05 LAB — COMPREHENSIVE METABOLIC PANEL
ALBUMIN: 4 g/dL (ref 3.5–5.2)
ALT: 9 U/L (ref 0–35)
AST: 16 U/L (ref 0–37)
Alkaline Phosphatase: 45 U/L (ref 39–117)
Anion gap: 14 (ref 5–15)
BUN: 25 mg/dL — ABNORMAL HIGH (ref 6–23)
CALCIUM: 10 mg/dL (ref 8.4–10.5)
CO2: 25 mEq/L (ref 19–32)
Chloride: 97 mEq/L (ref 96–112)
Creatinine, Ser: 1.53 mg/dL — ABNORMAL HIGH (ref 0.50–1.10)
GFR calc non Af Amer: 34 mL/min — ABNORMAL LOW (ref >90)
GFR, EST AFRICAN AMERICAN: 39 mL/min — AB (ref >90)
GLUCOSE: 115 mg/dL — AB (ref 70–99)
Potassium: 3.8 mEq/L (ref 3.7–5.3)
SODIUM: 136 meq/L — AB (ref 137–147)
Total Bilirubin: 0.4 mg/dL (ref 0.3–1.2)
Total Protein: 9.8 g/dL — ABNORMAL HIGH (ref 6.0–8.3)

## 2014-09-05 LAB — CBC WITH DIFFERENTIAL/PLATELET
BASOS ABS: 0 10*3/uL (ref 0.0–0.1)
BASOS PCT: 0 % (ref 0–1)
EOS ABS: 0 10*3/uL (ref 0.0–0.7)
EOS PCT: 0 % (ref 0–5)
HCT: 33.4 % — ABNORMAL LOW (ref 36.0–46.0)
Hemoglobin: 11.7 g/dL — ABNORMAL LOW (ref 12.0–15.0)
LYMPHS ABS: 1.8 10*3/uL (ref 0.7–4.0)
Lymphocytes Relative: 10 % — ABNORMAL LOW (ref 12–46)
MCH: 31.4 pg (ref 26.0–34.0)
MCHC: 35 g/dL (ref 30.0–36.0)
MCV: 89.5 fL (ref 78.0–100.0)
Monocytes Absolute: 1 10*3/uL (ref 0.1–1.0)
Monocytes Relative: 5 % (ref 3–12)
NEUTROS PCT: 85 % — AB (ref 43–77)
Neutro Abs: 15.3 10*3/uL — ABNORMAL HIGH (ref 1.7–7.7)
PLATELETS: 361 10*3/uL (ref 150–400)
RBC: 3.73 MIL/uL — ABNORMAL LOW (ref 3.87–5.11)
RDW: 16.7 % — AB (ref 11.5–15.5)
WBC: 18 10*3/uL — ABNORMAL HIGH (ref 4.0–10.5)

## 2014-09-05 LAB — LACTATE DEHYDROGENASE: LDH: 212 U/L (ref 94–250)

## 2014-09-05 MED ORDER — BORTEZOMIB CHEMO SQ INJECTION 3.5 MG (2.5MG/ML)
1.3000 mg/m2 | Freq: Once | INTRAMUSCULAR | Status: AC
  Administered 2014-09-05: 3 mg via SUBCUTANEOUS
  Filled 2014-09-05: qty 1.2

## 2014-09-05 MED ORDER — ONDANSETRON HCL 4 MG PO TABS
8.0000 mg | ORAL_TABLET | Freq: Once | ORAL | Status: AC
  Administered 2014-09-05: 8 mg via ORAL
  Filled 2014-09-05: qty 2

## 2014-09-05 MED ORDER — INFLUENZA VAC SPLIT QUAD 0.5 ML IM SUSY
0.5000 mL | PREFILLED_SYRINGE | Freq: Once | INTRAMUSCULAR | Status: AC
  Administered 2014-09-05: 0.5 mL via INTRAMUSCULAR
  Filled 2014-09-05: qty 0.5

## 2014-09-05 NOTE — Progress Notes (Signed)
Melissa [name]'s reason for visit today is for an injection and labs as scheduled per MD orders.  Labs were drawn prior to administration of ordered medication.  Venipuncture performed with a 23 gauge butterfly needle to L Antecubital.  Melissa [name] also received velacade and flurarix  per MD orders; see MAR for administration details.  Melissa [name] tolerated all procedures well and without incident; questions were answered and patient was discharged.

## 2014-09-05 NOTE — Progress Notes (Signed)
Distress screening completed. Consent for Velcade, Revlimid, & Dexamethasone obtained. I reviewed & gave a copy of the What to Know After Chemo papers with patient and her daughter.

## 2014-09-05 NOTE — Patient Instructions (Signed)
[facility] Discharge Instructions for Patients Receiving Chemotherapy  Today you received the following chemotherapy agents velacade  To help prevent nausea and vomiting after your treatment, we encourage you to take your nausea medication   If you develop nausea and vomiting that is not controlled by your nausea medication, call the clinic.   BELOW ARE SYMPTOMS THAT SHOULD BE REPORTED IMMEDIATELY:  *FEVER GREATER THAN 100.5 F  *CHILLS WITH OR WITHOUT FEVER  NAUSEA AND VOMITING THAT IS NOT CONTROLLED WITH YOUR NAUSEA MEDICATION  *UNUSUAL SHORTNESS OF BREATH  *UNUSUAL BRUISING OR BLEEDING  TENDERNESS IN MOUTH AND THROAT WITH OR WITHOUT PRESENCE OF ULCERS  *URINARY PROBLEMS  *BOWEL PROBLEMS  UNUSUAL RASH Items with * indicate a potential emergency and should be followed up as soon as possible.  Feel free to call the clinic you have any questions or concerns. The clinic phone number is [phone].

## 2014-09-05 NOTE — Progress Notes (Signed)
Melissa [name] Please see velacade progress note

## 2014-09-06 ENCOUNTER — Telehealth (HOSPITAL_COMMUNITY): Payer: Self-pay | Admitting: *Deleted

## 2014-09-06 ENCOUNTER — Telehealth (HOSPITAL_COMMUNITY): Payer: Self-pay | Admitting: Hematology and Oncology

## 2014-09-06 ENCOUNTER — Telehealth (HOSPITAL_COMMUNITY): Payer: Self-pay | Admitting: Emergency Medicine

## 2014-09-06 ENCOUNTER — Telehealth: Payer: Self-pay | Admitting: Family Medicine

## 2014-09-06 ENCOUNTER — Encounter: Payer: Self-pay | Admitting: *Deleted

## 2014-09-06 LAB — KAPPA/LAMBDA LIGHT CHAINS
Kappa free light chain: 6.69 mg/dL — ABNORMAL HIGH (ref 0.33–1.94)
Kappa, lambda light chain ratio: 2.38 — ABNORMAL HIGH (ref 0.26–1.65)
Lambda free light chains: 2.81 mg/dL — ABNORMAL HIGH (ref 0.57–2.63)

## 2014-09-06 LAB — C-REACTIVE PROTEIN: CRP: 0.7 mg/dL — ABNORMAL HIGH (ref <0.60)

## 2014-09-06 NOTE — Telephone Encounter (Signed)
Addendum to previous note: Patient squeezed a ball last night with her hands per instructions of Tom to help "pump up" her veins. She was wondering if this contributed to her hands being sore. I told her that it was possible. Pt to call in am with report of how she is feeling.

## 2014-09-06 NOTE — Telephone Encounter (Signed)
Patient vomited her food that she had eaten for lunch around 1245. She had eaten creamed potatoes and cabbage. Patient also had loose stools (only flushed toilet once) while vomiting. Patient took a Compazine tablet around 1220. I instructed pt to take another Compazine tablet at 1800. I instructed pt to go ahead and take a Compazine tablet in the am 30 minutes prior to Revlimid also. Pt said ok to all of these instructions. She is going to contact me around 3pm today to let me know how she is doing.

## 2014-09-06 NOTE — Telephone Encounter (Signed)
PT HAS COPAY ASSIST FOR REVLIMID WITH PAN 09/01/14-09/01/15 $10,000.00

## 2014-09-06 NOTE — Telephone Encounter (Signed)
Spoke with patient directly has nausea , with treatment, she has been vomiting, has medication for nausea

## 2014-09-06 NOTE — Telephone Encounter (Signed)
Patient called me this am to let me know that she is having stiffness in her hands and that her feet are sore when she stands on them. Pt did say that she squeezed a ball last night due to Tom's instructions so that it would pump her veins up. Last night she had to gargle with warm salt water last night and this am due to her throat being sore. Patient instructed to take her Aleve as her bottle instructs her this am and then again tonight. I asked patient to call me tomorrow morning and let me know how her throat, hands, and feet feel.

## 2014-09-06 NOTE — Progress Notes (Signed)
[name] Psychosocial Distress Screening Clinical Social Work  Clinical Social Work was referred by distress screening protocol.  The patient scored a 5 on the Psychosocial Distress Thermometer which indicates mild distress. Clinical Social Worker reviewed chart and discussed case with RN to assess for distress and other psychosocial needs. Per RN, Melissa [name], pt's pain concerns were addressed at visit and again via phone call. No psychosocial concerns noted at this time. Pt is aware of how to contact CSW as needed.   ONCBCN DISTRESS SCREENING 09/05/2014  Screening Type Initial Screening  Mark the number that describes how much distress you have been experiencing in the past week 5  Physical Problem type Pain  Physician notified of physical symptoms Yes   Clinical Social Worker follow up needed: No.  If yes, follow up plan:  [name], [place] Tuesdays 8:30-1pm Wednesdays 8:30-12pm  [phone]

## 2014-09-06 NOTE — Telephone Encounter (Signed)
Telephone call  

## 2014-09-07 ENCOUNTER — Telehealth (HOSPITAL_COMMUNITY): Payer: Self-pay | Admitting: *Deleted

## 2014-09-07 LAB — MULTIPLE MYELOMA PANEL, SERUM
Albumin ELP: 45.1 % — ABNORMAL LOW (ref 55.8–66.1)
Alpha-1-Globulin: 4.7 % (ref 2.9–4.9)
Alpha-2-Globulin: 10 % (ref 7.1–11.8)
Beta 2: 3.6 % (ref 3.2–6.5)
Beta Globulin: 4.7 % (ref 4.7–7.2)
GAMMA GLOBULIN: 31.9 % — AB (ref 11.1–18.8)
IGA: 92 mg/dL (ref 69–380)
IGG (IMMUNOGLOBIN G), SERUM: 2870 mg/dL — AB (ref 690–1700)
IgM, Serum: 29 mg/dL — ABNORMAL LOW (ref 52–322)
M-SPIKE, %: 2.51 g/dL
TOTAL PROTEIN: 9.3 g/dL — AB (ref 6.0–8.3)

## 2014-09-07 LAB — BETA 2 MICROGLOBULIN, SERUM: Beta-2 Microglobulin: 7.98 mg/L — ABNORMAL HIGH (ref <=2.51)

## 2014-09-07 NOTE — Telephone Encounter (Signed)
I called Melissa [name] today to see how she was doing but didn't get an answer. No voicemail set up on phone.

## 2014-09-08 ENCOUNTER — Ambulatory Visit (INDEPENDENT_AMBULATORY_CARE_PROVIDER_SITE_OTHER): Payer: Medicare Other | Admitting: Otolaryngology

## 2014-09-08 ENCOUNTER — Encounter (HOSPITAL_BASED_OUTPATIENT_CLINIC_OR_DEPARTMENT_OTHER): Payer: Medicare Other

## 2014-09-08 VITALS — BP 148/54 | HR 65 | Temp 97.8°F | Resp 16 | Wt 247.0 lb

## 2014-09-08 DIAGNOSIS — Z5112 Encounter for antineoplastic immunotherapy: Secondary | ICD-10-CM

## 2014-09-08 DIAGNOSIS — D449 Neoplasm of uncertain behavior of unspecified endocrine gland: Secondary | ICD-10-CM

## 2014-09-08 DIAGNOSIS — C9000 Multiple myeloma not having achieved remission: Secondary | ICD-10-CM

## 2014-09-08 MED ORDER — ONDANSETRON HCL 4 MG PO TABS
8.0000 mg | ORAL_TABLET | Freq: Once | ORAL | Status: AC
  Administered 2014-09-08: 8 mg via ORAL
  Filled 2014-09-08: qty 2

## 2014-09-08 MED ORDER — BORTEZOMIB CHEMO SQ INJECTION 3.5 MG (2.5MG/ML)
1.3000 mg/m2 | Freq: Once | INTRAMUSCULAR | Status: AC
  Administered 2014-09-08: 3 mg via SUBCUTANEOUS
  Filled 2014-09-08: qty 3

## 2014-09-08 NOTE — Patient Instructions (Signed)
[facility] Discharge Instructions for Patients Receiving Chemotherapy  Today you received the following chemotherapy agents velacade   To help prevent nausea and vomiting after your treatment, we encourage you to take your nausea medication    If you develop nausea and vomiting that is not controlled by your nausea medication, call the clinic.   BELOW ARE SYMPTOMS THAT SHOULD BE REPORTED IMMEDIATELY:  *FEVER GREATER THAN 100.5 F  *CHILLS WITH OR WITHOUT FEVER  NAUSEA AND VOMITING THAT IS NOT CONTROLLED WITH YOUR NAUSEA MEDICATION  *UNUSUAL SHORTNESS OF BREATH  *UNUSUAL BRUISING OR BLEEDING  TENDERNESS IN MOUTH AND THROAT WITH OR WITHOUT PRESENCE OF ULCERS  *URINARY PROBLEMS  *BOWEL PROBLEMS  UNUSUAL RASH Items with * indicate a potential emergency and should be followed up as soon as possible.  Feel free to call the clinic you have any questions or concerns. The clinic phone number is [phone].

## 2014-09-08 NOTE — Progress Notes (Signed)
Melissa [name] Tolerated chemotherapy injection well today.  Discharged ambulatory

## 2014-09-09 ENCOUNTER — Telehealth (HOSPITAL_COMMUNITY): Payer: Self-pay | Admitting: *Deleted

## 2014-09-09 NOTE — Telephone Encounter (Signed)
Patient called to let us know that her wrists were a #7/10 for pain. She said they are so sore when she goes to move them. Sitting still they are ok but when she moves they are very sore. She can barely use her hands due to her wrists hurting so badly when she moves them. Patient was squeezing a phlebotomy ball as instructed by [facility] Staff to help "pump up" her veins. Shellia Carwin RN told her yday to stop squeezing the ball since her wrists were bothering her. She has not squeezed the ball since 09/08/14. Patient asked if she could use Aspercreme on her wrists and take Aleve. Dr. Barnet Glasgow agreed that her wrists were hurting from squeezing the ball and told me to tell her to stop squeezing it. He also said that it was fine to take Aleve po and to put Aspercreme on her wrists. This information was relayed to patient's daughter Jonelle Sidle. She said thank you and she verbalized understanding of instructions.

## 2014-09-12 ENCOUNTER — Encounter (HOSPITAL_COMMUNITY): Payer: Self-pay

## 2014-09-12 ENCOUNTER — Encounter (HOSPITAL_BASED_OUTPATIENT_CLINIC_OR_DEPARTMENT_OTHER): Payer: Medicare Other

## 2014-09-12 ENCOUNTER — Other Ambulatory Visit: Payer: Self-pay | Admitting: Family Medicine

## 2014-09-12 VITALS — BP 145/58 | HR 51 | Temp 98.3°F | Resp 20 | Wt 246.4 lb

## 2014-09-12 DIAGNOSIS — C9000 Multiple myeloma not having achieved remission: Secondary | ICD-10-CM

## 2014-09-12 DIAGNOSIS — R809 Proteinuria, unspecified: Secondary | ICD-10-CM

## 2014-09-12 DIAGNOSIS — R803 Bence Jones proteinuria: Secondary | ICD-10-CM

## 2014-09-12 DIAGNOSIS — Z5112 Encounter for antineoplastic immunotherapy: Secondary | ICD-10-CM

## 2014-09-12 DIAGNOSIS — Z85528 Personal history of other malignant neoplasm of kidney: Secondary | ICD-10-CM

## 2014-09-12 DIAGNOSIS — N189 Chronic kidney disease, unspecified: Secondary | ICD-10-CM

## 2014-09-12 DIAGNOSIS — D892 Hypergammaglobulinemia, unspecified: Secondary | ICD-10-CM | POA: Diagnosis not present

## 2014-09-12 LAB — COMPREHENSIVE METABOLIC PANEL
ALBUMIN: 3.1 g/dL — AB (ref 3.5–5.2)
ALK PHOS: 36 U/L — AB (ref 39–117)
ALT: 22 U/L (ref 0–35)
AST: 14 U/L (ref 0–37)
Anion gap: 12 (ref 5–15)
BUN: 31 mg/dL — ABNORMAL HIGH (ref 6–23)
CO2: 27 mEq/L (ref 19–32)
Calcium: 9.2 mg/dL (ref 8.4–10.5)
Chloride: 102 mEq/L (ref 96–112)
Creatinine, Ser: 1.37 mg/dL — ABNORMAL HIGH (ref 0.50–1.10)
GFR calc Af Amer: 45 mL/min — ABNORMAL LOW (ref >90)
GFR calc non Af Amer: 39 mL/min — ABNORMAL LOW (ref >90)
Glucose, Bld: 113 mg/dL — ABNORMAL HIGH (ref 70–99)
POTASSIUM: 4.1 meq/L (ref 3.7–5.3)
Sodium: 141 mEq/L (ref 137–147)
TOTAL PROTEIN: 7 g/dL (ref 6.0–8.3)
Total Bilirubin: 0.3 mg/dL (ref 0.3–1.2)

## 2014-09-12 LAB — CBC WITH DIFFERENTIAL/PLATELET
BASOS PCT: 0 % (ref 0–1)
Basophils Absolute: 0 10*3/uL (ref 0.0–0.1)
EOS ABS: 0 10*3/uL (ref 0.0–0.7)
Eosinophils Relative: 0 % (ref 0–5)
HCT: 30.4 % — ABNORMAL LOW (ref 36.0–46.0)
Hemoglobin: 11 g/dL — ABNORMAL LOW (ref 12.0–15.0)
Lymphocytes Relative: 13 % (ref 12–46)
Lymphs Abs: 1.6 10*3/uL (ref 0.7–4.0)
MCH: 31.4 pg (ref 26.0–34.0)
MCHC: 36.2 g/dL — ABNORMAL HIGH (ref 30.0–36.0)
MCV: 86.9 fL (ref 78.0–100.0)
Monocytes Absolute: 1.3 10*3/uL — ABNORMAL HIGH (ref 0.1–1.0)
Monocytes Relative: 10 % (ref 3–12)
NEUTROS PCT: 77 % (ref 43–77)
Neutro Abs: 9.2 10*3/uL — ABNORMAL HIGH (ref 1.7–7.7)
Platelets: 211 10*3/uL (ref 150–400)
RBC: 3.5 MIL/uL — AB (ref 3.87–5.11)
RDW: 16.3 % — ABNORMAL HIGH (ref 11.5–15.5)
WBC: 12 10*3/uL — ABNORMAL HIGH (ref 4.0–10.5)

## 2014-09-12 MED ORDER — ONDANSETRON HCL 4 MG PO TABS
8.0000 mg | ORAL_TABLET | Freq: Once | ORAL | Status: AC
  Administered 2014-09-12: 8 mg via ORAL

## 2014-09-12 MED ORDER — BORTEZOMIB CHEMO SQ INJECTION 3.5 MG (2.5MG/ML)
1.3000 mg/m2 | Freq: Once | INTRAMUSCULAR | Status: AC
  Administered 2014-09-12: 3 mg via SUBCUTANEOUS
  Filled 2014-09-12: qty 3

## 2014-09-12 MED ORDER — ONDANSETRON HCL 4 MG PO TABS
ORAL_TABLET | ORAL | Status: AC
  Filled 2014-09-12: qty 2

## 2014-09-12 NOTE — Progress Notes (Signed)
Melissa [name] presents today for injection per MD orders. velcade 3 mg administered SQ in right lower lateral Abdomen. Administration without incident. Patient tolerated well.

## 2014-09-12 NOTE — Patient Instructions (Signed)
[facility] Discharge Instructions  RECOMMENDATIONS MADE BY THE CONSULTANT AND ANY TEST RESULTS WILL BE SENT TO YOUR REFERRING PHYSICIAN.  EXAM FINDINGS BY THE PHYSICIAN TODAY AND SIGNS OR SYMPTOMS TO REPORT TO CLINIC OR PRIMARY PHYSICIAN: You seen Dr Barnet Glasgow today  You received velcade today.  Follow up with doctor in 1 week with CBC  Thank you for choosing [facility] to provide your oncology and hematology care.  To afford each patient quality time with our providers, please arrive at least 15 minutes before your scheduled appointment time.  With your help, our goal is to use those 15 minutes to complete the necessary work-up to ensure our physicians have the information they need to help with your evaluation and healthcare recommendations.    Effective January 1st, 2014, we ask that you re-schedule your appointment with our physicians should you arrive 10 or more minutes late for your appointment.  We strive to give you quality time with our providers, and arriving late affects you and other patients whose appointments are after yours.    Again, thank you for choosing [facility].  Our hope is that these requests will decrease the amount of time that you wait before being seen by our physicians.       _____________________________________________________________  Should you have questions after your visit to [facility], please contact our office at [phone] between the hours of 8:30 a.m. and 5:00 p.m.  Voicemails left after 4:30 p.m. will not be returned until the following business day.  For prescription refill requests, have your pharmacy contact our office with your prescription refill request.

## 2014-09-12 NOTE — Progress Notes (Signed)
LABS FOR CMP,CBCD 

## 2014-09-12 NOTE — Progress Notes (Signed)
[facility]  OFFICE PROGRESS NOTE  Melissa Nakayama, MD [address], [address] [place] [address]  DIAGNOSIS: Multiple myeloma without remission - Plan: CBC with Differential  Bence Jones proteinuria  Chief Complaint  Patient presents with  . IgG lambda multiple myeloma    CURRENT THERAPY: Revlimid 25 mg daily days 1-14 every 21 days, Velcade subcutaneously day 1, 4, 8, and 11 every 21 days and dexamethasone 10 mg cycle 1 begun on 09/05/2014.  INTERVAL HISTORY: Melissa [name] 68 y.o. female returns for followup one week after initiation of RVD. 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 Previous history of renal cell carcinoma, status post right radical nephrectomy in December 2008 with no evidence of disease on CT scan done on 04/20/2014. She has been intensively attempting to improve her pain so that blood test can be done more easily by compressing a rubber ball in each hand to the point where her wrists actually became swollen. Apparently this held because there was no difficulty in getting her blood count from this morning. She denies any lower extremity swelling or redness, nausea, vomiting, peripheral paresthesias, skin rash, chest pain, PND, orthopnea, or palpitations.  MEDICAL HISTORY: Past Medical History  Diagnosis Date  . Vertigo, intermittent   . Hypothyroidism   . Hyperlipidemia   . Obesity   . Hypertension     severe and resistant to treatment; 2008-negative left renal angiogram  . Vitiligo   . Degenerative disc disease, lumbar   . Degenerative disc disease, cervical   . Chest pain 2008    2008-normal coronary angiography  . Renal cell carcinoma     Right nephrectomy    INTERIM HISTORY: has Hypothyroid; VITAMIN D DEFICIENCY; Obesity; Depression; HEARING LOSS; VERTIGO, INTERMITTENT; Vitiligo; Hyperlipidemia; Hypertension; History of kidney cancer; Chest pain; Dyspnea on exertion;  Anemia; Routine general medical examination at a health care facility; Pulmonary fibrosis; Lung nodules; Left groin pain; Positive ANA (antinuclear antibody); Knee pain, right; Radicular low back pain; Difficulty walking; Bilateral leg weakness; Bence Jones proteinuria; Cystitis; and Multiple myeloma without remission on her problem list.   Multiple myeloma without remission  08/15/2014  Bone Marrow Biopsy  Bone Marrow, Aspirate,Biopsy, and Clot, right iliac - HYPERCELLULAR BONE MARROW FOR AGE WITH TRILINEAGE HEMATOPOIESIS. - PLASMACYTOSIS (PLASMA CELLS 20%).  Chemotherapy  Revlimid (25 mg days 1-14 every 21 days)/Velcade (D1, 4, 8, and 11 every 21 days)/dexamethasone   ALLERGIES:  is allergic to morphine.  MEDICATIONS: has a current medication list which includes the following prescription(s): acyclovir, amlodipine, aspirin, atenolol, bortezomib, budesonide-formoterol, calcitriol, dexamethasone, fluoxetine, fluticasone, furosemide, hydralazine, hydralazine, lenalidomide, levothyroxine, levothyroxine, lovastatin, meclizine, multivitamin with minerals, prochlorperazine, spironolactone, and zoledronic acid, and the following Facility-Administered Medications: bortezomib sq.  SURGICAL HISTORY:  Past Surgical History  Procedure Laterality Date  . Tubal ligation  1983    Bilateral  . Nephrectomy      Right; secondary to renal cell carcinoma  . Colonoscopy N/A 04/21/2014    Procedure: COLONOSCOPY;  Surgeon: Rogene Houston, MD;  Location: [facility];  Service: Endoscopy;  Laterality: N/A;  . Colonoscopy N/A 04/21/2014    Procedure: COLONOSCOPY;  Surgeon: Rogene Houston, MD;  Location: [facility];  Service: Endoscopy;  Laterality: N/A;  1030    FAMILY HISTORY: family history includes Colon cancer in her father; Diabetes in her sister; Heart disease (age of onset: 90) in her mother; Heart failure in her sister; Hypertension  in her mother and sister; Lupus in her brother.  SOCIAL HISTORY:   reports that she has never smoked. She has never used smokeless tobacco. She reports that she does not drink alcohol or use illicit drugs.  REVIEW OF SYSTEMS:  Other than that discussed above is noncontributory.  PHYSICAL EXAMINATION: ECOG PERFORMANCE STATUS: 1 - Symptomatic but completely ambulatory  Blood pressure 145/58, pulse 51, temperature 98.3 F (36.8 C), temperature source Oral, resp. rate 20, weight 246 lb 6.4 oz (111.766 kg), SpO2 97.00%.  GENERAL:alert, no distress and comfortable. Morbidly obese SKIN: skin color, texture, turgor are normal, no rashes or significant lesions EYES: PERLA; Conjunctiva are pink and non-injected, sclera clear SINUSES: No redness or tenderness over maxillary or ethmoid sinuses OROPHARYNX:no exudate, no erythema on lips, buccal mucosa, or tongue. NECK: supple, thyroid normal size, non-tender, without nodularity. No masses CHEST: Normal AP diameter LYMPH:  no palpable lymphadenopathy in the cervical, axillary or inguinal LUNGS: clear to auscultation and percussion with normal breathing effort HEART: regular rate & rhythm and no murmurs. ABDOMEN:abdomen soft, non-tender and normal bowel sounds MUSCULOSKELETAL:no cyanosis of digits and no clubbing. Range of motion normal. Minimal swelling of both wrists. NEURO: alert & oriented x 3 with fluent speech, no focal motor/sensory deficits   LABORATORY DATA: Appointment on 09/12/2014  Component Date Value Ref Range Status  . WBC 09/12/2014 12.0* 4.0 - 10.5 K/uL Final  . RBC 09/12/2014 3.50* 3.87 - 5.11 MIL/uL Final  . Hemoglobin 09/12/2014 11.0* 12.0 - 15.0 g/dL Final  . HCT 09/12/2014 30.4* 36.0 - 46.0 % Final  . MCV 09/12/2014 86.9  78.0 - 100.0 fL Final  . MCH 09/12/2014 31.4  26.0 - 34.0 pg Final  . MCHC 09/12/2014 36.2* 30.0 - 36.0 g/dL Final  . RDW 09/12/2014 16.3* 11.5 - 15.5 % Final  . Platelets 09/12/2014 211  150 - 400 K/uL Final  . Neutrophils Relative % 09/12/2014 77  43 - 77 % Final  .  Neutro Abs 09/12/2014 9.2* 1.7 - 7.7 K/uL Final  . Lymphocytes Relative 09/12/2014 13  12 - 46 % Final  . Lymphs Abs 09/12/2014 1.6  0.7 - 4.0 K/uL Final  . Monocytes Relative 09/12/2014 10  3 - 12 % Final  . Monocytes Absolute 09/12/2014 1.3* 0.1 - 1.0 K/uL Final  . Eosinophils Relative 09/12/2014 0  0 - 5 % Final  . Eosinophils Absolute 09/12/2014 0.0  0.0 - 0.7 K/uL Final  . Basophils Relative 09/12/2014 0  0 - 1 % Final  . Basophils Absolute 09/12/2014 0.0  0.0 - 0.1 K/uL Final  . Sodium 09/12/2014 141  137 - 147 mEq/L Final  . Potassium 09/12/2014 4.1  3.7 - 5.3 mEq/L Final  . Chloride 09/12/2014 102  96 - 112 mEq/L Final  . CO2 09/12/2014 27  19 - 32 mEq/L Final  . Glucose, Bld 09/12/2014 113* 70 - 99 mg/dL Final  . BUN 09/12/2014 31* 6 - 23 mg/dL Final  . Creatinine, Ser 09/12/2014 1.37* 0.50 - 1.10 mg/dL Final  . Calcium 09/12/2014 9.2  8.4 - 10.5 mg/dL Final  . Total Protein 09/12/2014 7.0  6.0 - 8.3 g/dL Final  . Albumin 09/12/2014 3.1* 3.5 - 5.2 g/dL Final  . AST 09/12/2014 14  0 - 37 U/L Final  . ALT 09/12/2014 22  0 - 35 U/L Final  . Alkaline Phosphatase 09/12/2014 36* 39 - 117 U/L Final  . Total Bilirubin 09/12/2014 0.3  0.3 - 1.2 mg/dL Final  . GFR  calc non Af Amer 09/12/2014 39* >90 mL/min Final  . GFR calc Af Amer 09/12/2014 45* >90 mL/min Final   Comment: (NOTE)                          The eGFR has been calculated using the CKD EPI equation.                          This calculation has not been validated in all clinical situations.                          eGFR's persistently <90 mL/min signify possible Chronic Kidney                          Disease.  . Anion gap 09/12/2014 12  5 - 15 Final  Infusion on 09/05/2014  Component Date Value Ref Range Status  . WBC 09/05/2014 18.0* 4.0 - 10.5 K/uL Final  . RBC 09/05/2014 3.73* 3.87 - 5.11 MIL/uL Final  . Hemoglobin 09/05/2014 11.7* 12.0 - 15.0 g/dL Final  . HCT 09/05/2014 33.4* 36.0 - 46.0 % Final  . MCV 09/05/2014  89.5  78.0 - 100.0 fL Final  . MCH 09/05/2014 31.4  26.0 - 34.0 pg Final  . MCHC 09/05/2014 35.0  30.0 - 36.0 g/dL Final  . RDW 09/05/2014 16.7* 11.5 - 15.5 % Final  . Platelets 09/05/2014 361  150 - 400 K/uL Final  . Neutrophils Relative % 09/05/2014 85* 43 - 77 % Final  . Neutro Abs 09/05/2014 15.3* 1.7 - 7.7 K/uL Final  . Lymphocytes Relative 09/05/2014 10* 12 - 46 % Final  . Lymphs Abs 09/05/2014 1.8  0.7 - 4.0 K/uL Final  . Monocytes Relative 09/05/2014 5  3 - 12 % Final  . Monocytes Absolute 09/05/2014 1.0  0.1 - 1.0 K/uL Final  . Eosinophils Relative 09/05/2014 0  0 - 5 % Final  . Eosinophils Absolute 09/05/2014 0.0  0.0 - 0.7 K/uL Final  . Basophils Relative 09/05/2014 0  0 - 1 % Final  . Basophils Absolute 09/05/2014 0.0  0.0 - 0.1 K/uL Final  . Sodium 09/05/2014 136* 137 - 147 mEq/L Final  . Potassium 09/05/2014 3.8  3.7 - 5.3 mEq/L Final  . Chloride 09/05/2014 97  96 - 112 mEq/L Final  . CO2 09/05/2014 25  19 - 32 mEq/L Final  . Glucose, Bld 09/05/2014 115* 70 - 99 mg/dL Final  . BUN 09/05/2014 25* 6 - 23 mg/dL Final  . Creatinine, Ser 09/05/2014 1.53* 0.50 - 1.10 mg/dL Final  . Calcium 09/05/2014 10.0  8.4 - 10.5 mg/dL Final  . Total Protein 09/05/2014 9.8* 6.0 - 8.3 g/dL Final  . Albumin 09/05/2014 4.0  3.5 - 5.2 g/dL Final  . AST 09/05/2014 16  0 - 37 U/L Final  . ALT 09/05/2014 9  0 - 35 U/L Final  . Alkaline Phosphatase 09/05/2014 45  39 - 117 U/L Final  . Total Bilirubin 09/05/2014 0.4  0.3 - 1.2 mg/dL Final  . GFR calc non Af Amer 09/05/2014 34* >90 mL/min Final  . GFR calc Af Amer 09/05/2014 39* >90 mL/min Final   Comment: (NOTE)                          The eGFR has been calculated using  the CKD EPI equation.                          This calculation has not been validated in all clinical situations.                          eGFR's persistently <90 mL/min signify possible Chronic Kidney                          Disease.  . Anion gap 09/05/2014 14  5 - 15 Final    . CRP 09/05/2014 0.7* <0.60 mg/dL Final   Performed at [facility]  . LDH 09/05/2014 212  94 - 250 U/L Final  . Beta-2 Microglobulin 09/05/2014 7.98* <=2.51 mg/L Final   Performed at [facility]  . Total Protein 09/05/2014 9.3* 6.0 - 8.3 g/dL Final  . Albumin ELP 09/05/2014 45.1* 55.8 - 66.1 % Final  . Alpha-1-Globulin 09/05/2014 4.7  2.9 - 4.9 % Final  . Alpha-2-Globulin 09/05/2014 10.0  7.1 - 11.8 % Final  . Beta Globulin 09/05/2014 4.7  4.7 - 7.2 % Final  . Beta 2 09/05/2014 3.6  3.2 - 6.5 % Final  . Gamma Globulin 09/05/2014 31.9* 11.1 - 18.8 % Final  . M-Spike, % 09/05/2014 2.51   Final  . SPE Interp. 09/05/2014 (NOTE)   Final   Comment: A restricted band consistent with monoclonal protein is present.                          The monoclonal protein peak accounts for 2.51 g/dL of the total                          2.97 g/dL of protein in the gamma region.                          Results are consistent with SPE performed on 08/25/2014  Reviewed by                          Odis Hollingshead, MD, PhD, FCAP (Electronic Signature on File)  . Comment 09/05/2014 (NOTE)   Final   Comment: ---------------                          Serum protein electrophoresis is a useful screening procedure in the                          detection of various pathophysiologic states such as inflammation,                          gammopathies, protein loss and other dysproteinemias.  Immunofixation                          electrophoresis (IFE) is a more sensitive technique for the                          identification of M-proteins found in patients with monoclonal  gammopathy of unknown significance (MGUS), amyloidosis, early or                          treated myeloma or macroglobulinemia, solitary plasmacytoma or                          extramedullary plasmacytoma.  . IgG (Immunoglobin G), Serum 09/05/2014 2870* 690 - 1700 mg/dL Final  . IgA 09/05/2014 92  69  - 380 mg/dL Final  . IgM, Serum 09/05/2014 29* 52 - 322 mg/dL Final  . Immunofix Electr Int 09/05/2014 (NOTE)   Final   Comment: Monoclonal IgG kappa protein is present.                          Reviewed by Odis Hollingshead, MD, PhD, FCAP (Electronic Signature on                          File)                          Performed at [facility]  . Kappa free light chain 09/05/2014 6.69* 0.33 - 1.94 mg/dL Final  . Lamda free light chains 09/05/2014 2.81* 0.57 - 2.63 mg/dL Final  . Kappa, lamda light chain ratio 09/05/2014 2.38* 0.26 - 1.65 Final   Performed at [facility] Outpatient Visit on 08/15/2014  Component Date Value Ref Range Status  . aPTT 08/15/2014 40* 24 - 37 seconds Final   Comment:                                 IF BASELINE aPTT IS ELEVATED,                          SUGGEST PATIENT RISK ASSESSMENT                          BE USED TO DETERMINE APPROPRIATE                          ANTICOAGULANT THERAPY.  . WBC 08/15/2014 7.1  4.0 - 10.5 K/uL Final  . RBC 08/15/2014 3.43* 3.87 - 5.11 MIL/uL Final  . Hemoglobin 08/15/2014 10.9* 12.0 - 15.0 g/dL Final  . HCT 08/15/2014 30.6* 36.0 - 46.0 % Final  . MCV 08/15/2014 89.2  78.0 - 100.0 fL Final  . MCH 08/15/2014 31.8  26.0 - 34.0 pg Final  . MCHC 08/15/2014 35.6  30.0 - 36.0 g/dL Final  . RDW 08/15/2014 16.2* 11.5 - 15.5 % Final  . Platelets 08/15/2014 272  150 - 400 K/uL Final  . Prothrombin Time 08/15/2014 14.4  11.6 - 15.2 seconds Final  . INR 08/15/2014 1.12  0.00 - 1.49 Final  . Bone Marrow Exam 08/15/2014 SEE PATHOLOGY REPORT [id number]   Final  . Chromosome-Routine 08/15/2014 SEE SEPARATE REPORT   Final   Performed at [facility]: 20% plasmacytosis on bone marrow with negative cytogenetics.  Urinalysis    Component Value Date/Time   COLORURINE YELLOW 04/20/2014 [place] 04/20/2014 0743   LABSPEC 1.025 04/20/2014 0743   PHURINE 5.5 04/20/2014  [place] 04/20/2014 0743   GLUCOSEU NEG mg/dL 07/17/2009 2157   HGBUR NEGATIVE 04/20/2014 0743   HGBUR large 05/18/2010 1003   BILIRUBINUR neg 05/04/2014 1133   [place] 04/20/2014 0743   KETONESUR NEGATIVE 04/20/2014 0743   PROTEINUR neg 05/04/2014 1133   PROTEINUR NEGATIVE 04/20/2014 0743   UROBILINOGEN 0.2 05/04/2014 1133   UROBILINOGEN 0.2 04/20/2014 0743   NITRITE neg 05/04/2014 1133   NITRITE NEGATIVE 04/20/2014 0743   LEUKOCYTESUR Negative 05/04/2014 1133    RADIOGRAPHIC STUDIES: US Soft Tissue Head/neck  08/31/2014   CLINICAL DATA:  thyroid nodule. FNA biopsy of dominant right nodule 09/07/2013.  EXAM: THYROID ULTRASOUND  TECHNIQUE: Ultrasound examination of the thyroid gland and adjacent soft tissues was performed.  COMPARISON:  08/04/2013  FINDINGS: Right thyroid lobe  Measurements: 50 x 23 x 32 mm. Dominant 36 x 20 x 25 mm complex solid nodule, mid lobe (previously 19 x 26 x 29). Adjacent 9 x 6 mm complex cyst at its inferior margin (previously 6 x 8 x 9).  Left thyroid lobe  Measurements: 48 x 18 x 27 mm. Markedly inhomogeneous background echotexture. Poorly marginated 14 x 7 x 10 mm hypoechoic lesion, mid lobe (previously 7 x 9 x 8). 8 x 6 x 9 mm complex cyst, inferior pole.  Isthmus  Thickness: 3 mm.  No nodules visualized.  Lymphadenopathy  None visualized.  IMPRESSION: 1. Thyromegaly with bilateral nodules. Enlargement of dominant right lesion; correlate with previous biopsy results. No other lesion meets current consensus criteria for biopsy. Follow-up by clinical exam is recommended. If patient has known risk factors for thyroid carcinoma, consider follow-up ultrasound in 12 months. If patient is clinically hyperthyroid, consider nuclear medicine thyroid uptake and scan. This recommendation follows the consensus statement: Management of Thyroid Nodules Detected as [place]: Society of Radiologists in [place]. Radiology 2005; [id number].    Electronically Signed   By: Arne Cleveland M.D.   On: 08/31/2014 14:30   Ct Biopsy  08/15/2014   CLINICAL DATA:  68 year old female with monoclonal gammopathy of uncertain significance. Bone marrow biopsy is warranted to evaluate for multiple myeloma.  EXAM: CT BIOPSY  Date: 08/15/2014  PROCEDURE: 1. CT guided bone marrow aspiration and core biopsy Interventional Radiologist:  Criselda Peaches, MD  ANESTHESIA/SEDATION: Moderate (conscious) sedation was used. 3 mg Versed, 50 mcg Fentanyl were administered intravenously. The patient's vital signs were monitored continuously by radiology nursing throughout the procedure.  Sedation Time: 9 minutes  TECHNIQUE: Informed consent was obtained from the patient following explanation of the procedure, risks, benefits and alternatives. The patient understands, agrees and consents for the procedure. All questions were addressed. A time out was performed.  The patient was positioned prone and noncontrast localization CT was performed of the pelvis to demonstrate the iliac marrow spaces.  Maximal barrier sterile technique utilized including caps, mask, sterile gowns, sterile gloves, large sterile drape, hand hygiene, and betadine prep.  Under sterile conditions and local anesthesia, an 11 gauge coaxial bone biopsy needle was advanced into the right iliac marrow space. Needle position was confirmed with CT imaging. Initially, bone marrow aspiration was performed. Next, the 11 gauge outer cannula was utilized to obtain a right iliac bone marrow core biopsy using the On-Control drill device. Needle was removed. Hemostasis was obtained with compression. The patient tolerated the procedure well. Samples were prepared with the cytotechnologist. No immediate complications.  IMPRESSION: CT guided right iliac bone marrow aspiration and core biopsy using  the On-Control device.  Signed,  Criselda Peaches, MD  [facility]  [facility]    Electronically Signed   By: Jacqulynn Cadet M.D.   On: 08/15/2014 12:21    ASSESSMENT:  1. 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  2. Chronic kidney disease.  3. History of right renal cell carcinoma, status post resection with no postop therapy, surgery performed in December of 2008, no evidence of disease.  4. Hypertension, on treatment, controlled.  5. Hypothyroidism, on treatment.  6. Hyperlipidemia, on treatment.  7. Depression, on treatment.  8. Chronic obstructive pulmonary disease, on treatment.  9. Symptomatic allergic rhinitis    PLAN:  #1. Continue Velcade for day 8 today. #2. Continue Revlimid 25 mg daily for a total of 14 days. #3. Continue dexamethasone 10 mg as ordered. #4. Followup in one week with CBC. Additional Velcade will be given on 09/15/2014.   All questions were answered. The patient knows to call the clinic with any problems, questions or concerns. We can certainly see the patient much sooner if necessary.   I spent 25 minutes counseling the patient face to face. The total time spent in the appointment was 30 minutes.    Doroteo Bradford, MD 09/12/2014 12:38 PM  DISCLAIMER:  This note was dictated with voice recognition software.  Similar sounding words can inadvertently be transcribed inaccurately and may not be corrected upon review.

## 2014-09-15 ENCOUNTER — Inpatient Hospital Stay (HOSPITAL_COMMUNITY): Payer: Medicare Other

## 2014-09-15 ENCOUNTER — Encounter (HOSPITAL_BASED_OUTPATIENT_CLINIC_OR_DEPARTMENT_OTHER): Payer: Medicare Other

## 2014-09-15 VITALS — BP 147/80 | HR 58 | Temp 97.6°F | Resp 20 | Wt 245.2 lb

## 2014-09-15 DIAGNOSIS — Z5112 Encounter for antineoplastic immunotherapy: Secondary | ICD-10-CM

## 2014-09-15 DIAGNOSIS — C9000 Multiple myeloma not having achieved remission: Secondary | ICD-10-CM

## 2014-09-15 MED ORDER — BORTEZOMIB CHEMO SQ INJECTION 3.5 MG (2.5MG/ML)
1.3000 mg/m2 | Freq: Once | INTRAMUSCULAR | Status: AC
  Administered 2014-09-15: 3 mg via SUBCUTANEOUS
  Filled 2014-09-15: qty 3

## 2014-09-15 MED ORDER — ONDANSETRON HCL 4 MG PO TABS
8.0000 mg | ORAL_TABLET | Freq: Once | ORAL | Status: AC
  Administered 2014-09-15: 8 mg via ORAL
  Filled 2014-09-15: qty 2

## 2014-09-15 NOTE — Progress Notes (Unsigned)
Patient tolerated chemotherapy well.  

## 2014-09-15 NOTE — Patient Instructions (Signed)
[facility] Discharge Instructions for Patients Receiving Chemotherapy  Today you received the following chemotherapy agents:  Velcade  To help prevent nausea and vomiting after your treatment, we encourage you to take your nausea medication as prescribed.  If you develop nausea and vomiting, or diarrhea that is not controlled by your medication, call the clinic.  The clinic phone number is [phone]. Office hours are Monday-Friday 8:30am-5:00pm.  BELOW ARE SYMPTOMS THAT SHOULD BE REPORTED IMMEDIATELY:  *FEVER GREATER THAN 101.0 F  *CHILLS WITH OR WITHOUT FEVER  NAUSEA AND VOMITING THAT IS NOT CONTROLLED WITH YOUR NAUSEA MEDICATION  *UNUSUAL SHORTNESS OF BREATH  *UNUSUAL BRUISING OR BLEEDING  TENDERNESS IN MOUTH AND THROAT WITH OR WITHOUT PRESENCE OF ULCERS  *URINARY PROBLEMS  *BOWEL PROBLEMS  UNUSUAL RASH Items with * indicate a potential emergency and should be followed up as soon as possible. If you have an emergency after office hours please contact your primary care physician or go to the nearest emergency department.  Please call the clinic during office hours if you have any questions or concerns.   You may also contact the Patient Navigator at [phone] should you have any questions or need assistance in obtaining follow up care. _____________________________________________________________________ Have you asked about our STAR program?    STAR stands for Survivorship Training and Rehabilitation, and this is a nationally recognized cancer care program that focuses on survivorship and rehabilitation.  Cancer and cancer treatments may cause problems, such as, pain, making you feel tired and keeping you from doing the things that you need or want to do. Cancer rehabilitation can help. Our goal is to reduce these troubling effects and help you have the best quality of life possible.  You may receive a survey from a nurse that asks questions about your  current state of health.  Based on the survey results, all eligible patients will be referred to the STAR program for an evaluation so we can better serve you! A frequently asked questions sheet is available upon request.           

## 2014-09-18 ENCOUNTER — Emergency Department (HOSPITAL_COMMUNITY): Payer: Medicare Other

## 2014-09-18 ENCOUNTER — Encounter (HOSPITAL_COMMUNITY): Payer: Self-pay | Admitting: Emergency Medicine

## 2014-09-18 ENCOUNTER — Inpatient Hospital Stay (HOSPITAL_COMMUNITY)
Admission: EM | Admit: 2014-09-18 | Discharge: 2014-10-01 | DRG: 064 | Disposition: A | Discharge status: Skilled Nursing Facility | Payer: Medicare Other | Attending: Internal Medicine | Admitting: Internal Medicine

## 2014-09-18 DIAGNOSIS — F419 Anxiety disorder, unspecified: Secondary | ICD-10-CM | POA: Diagnosis present

## 2014-09-18 DIAGNOSIS — Z8249 Family history of ischemic heart disease and other diseases of the circulatory system: Secondary | ICD-10-CM

## 2014-09-18 DIAGNOSIS — Z6839 Body mass index (BMI) 39.0-39.9, adult: Secondary | ICD-10-CM

## 2014-09-18 DIAGNOSIS — Z85528 Personal history of other malignant neoplasm of kidney: Secondary | ICD-10-CM

## 2014-09-18 DIAGNOSIS — J8410 Pulmonary fibrosis, unspecified: Secondary | ICD-10-CM | POA: Diagnosis present

## 2014-09-18 DIAGNOSIS — I6310 Cerebral infarction due to embolism of unspecified precerebral artery: Secondary | ICD-10-CM

## 2014-09-18 DIAGNOSIS — Z7982 Long term (current) use of aspirin: Secondary | ICD-10-CM

## 2014-09-18 DIAGNOSIS — Z6841 Body Mass Index (BMI) 40.0 and over, adult: Secondary | ICD-10-CM | POA: Diagnosis not present

## 2014-09-18 DIAGNOSIS — C9000 Multiple myeloma not having achieved remission: Secondary | ICD-10-CM | POA: Diagnosis present

## 2014-09-18 DIAGNOSIS — R0609 Other forms of dyspnea: Secondary | ICD-10-CM

## 2014-09-18 DIAGNOSIS — I272 Other secondary pulmonary hypertension: Secondary | ICD-10-CM | POA: Diagnosis present

## 2014-09-18 DIAGNOSIS — J309 Allergic rhinitis, unspecified: Secondary | ICD-10-CM | POA: Diagnosis present

## 2014-09-18 DIAGNOSIS — N179 Acute kidney failure, unspecified: Secondary | ICD-10-CM | POA: Diagnosis not present

## 2014-09-18 DIAGNOSIS — G936 Cerebral edema: Secondary | ICD-10-CM | POA: Diagnosis present

## 2014-09-18 DIAGNOSIS — I63512 Cerebral infarction due to unspecified occlusion or stenosis of left middle cerebral artery: Secondary | ICD-10-CM

## 2014-09-18 DIAGNOSIS — R1310 Dysphagia, unspecified: Secondary | ICD-10-CM | POA: Diagnosis present

## 2014-09-18 DIAGNOSIS — Z905 Acquired absence of kidney: Secondary | ICD-10-CM | POA: Diagnosis present

## 2014-09-18 DIAGNOSIS — R748 Abnormal levels of other serum enzymes: Secondary | ICD-10-CM | POA: Diagnosis present

## 2014-09-18 DIAGNOSIS — F329 Major depressive disorder, single episode, unspecified: Secondary | ICD-10-CM | POA: Diagnosis present

## 2014-09-18 DIAGNOSIS — R319 Hematuria, unspecified: Secondary | ICD-10-CM | POA: Diagnosis present

## 2014-09-18 DIAGNOSIS — I5032 Chronic diastolic (congestive) heart failure: Secondary | ICD-10-CM | POA: Diagnosis present

## 2014-09-18 DIAGNOSIS — G8191 Hemiplegia, unspecified affecting right dominant side: Secondary | ICD-10-CM | POA: Diagnosis present

## 2014-09-18 DIAGNOSIS — E876 Hypokalemia: Secondary | ICD-10-CM | POA: Diagnosis present

## 2014-09-18 DIAGNOSIS — Q2112 Patent foramen ovale: Secondary | ICD-10-CM

## 2014-09-18 DIAGNOSIS — R7989 Other specified abnormal findings of blood chemistry: Secondary | ICD-10-CM

## 2014-09-18 DIAGNOSIS — W19XXXA Unspecified fall, initial encounter: Secondary | ICD-10-CM | POA: Diagnosis present

## 2014-09-18 DIAGNOSIS — R4701 Aphasia: Secondary | ICD-10-CM | POA: Diagnosis present

## 2014-09-18 DIAGNOSIS — D6959 Other secondary thrombocytopenia: Secondary | ICD-10-CM | POA: Diagnosis present

## 2014-09-18 DIAGNOSIS — R29810 Facial weakness: Secondary | ICD-10-CM | POA: Diagnosis present

## 2014-09-18 DIAGNOSIS — I5031 Acute diastolic (congestive) heart failure: Secondary | ICD-10-CM

## 2014-09-18 DIAGNOSIS — I10 Essential (primary) hypertension: Secondary | ICD-10-CM

## 2014-09-18 DIAGNOSIS — T451X5A Adverse effect of antineoplastic and immunosuppressive drugs, initial encounter: Secondary | ICD-10-CM | POA: Diagnosis present

## 2014-09-18 DIAGNOSIS — E785 Hyperlipidemia, unspecified: Secondary | ICD-10-CM | POA: Diagnosis present

## 2014-09-18 DIAGNOSIS — J690 Pneumonitis due to inhalation of food and vomit: Secondary | ICD-10-CM | POA: Diagnosis present

## 2014-09-18 DIAGNOSIS — I639 Cerebral infarction, unspecified: Secondary | ICD-10-CM | POA: Diagnosis present

## 2014-09-18 DIAGNOSIS — E870 Hyperosmolality and hypernatremia: Secondary | ICD-10-CM | POA: Diagnosis present

## 2014-09-18 DIAGNOSIS — E669 Obesity, unspecified: Secondary | ICD-10-CM | POA: Diagnosis present

## 2014-09-18 DIAGNOSIS — R803 Bence Jones proteinuria: Secondary | ICD-10-CM

## 2014-09-18 DIAGNOSIS — Q211 Atrial septal defect: Secondary | ICD-10-CM

## 2014-09-18 DIAGNOSIS — R778 Other specified abnormalities of plasma proteins: Secondary | ICD-10-CM | POA: Diagnosis present

## 2014-09-18 DIAGNOSIS — Z4659 Encounter for fitting and adjustment of other gastrointestinal appliance and device: Secondary | ICD-10-CM

## 2014-09-18 DIAGNOSIS — E039 Hypothyroidism, unspecified: Secondary | ICD-10-CM | POA: Diagnosis present

## 2014-09-18 DIAGNOSIS — F32A Depression, unspecified: Secondary | ICD-10-CM

## 2014-09-18 DIAGNOSIS — N183 Chronic kidney disease, stage 3 unspecified: Secondary | ICD-10-CM | POA: Diagnosis present

## 2014-09-18 DIAGNOSIS — J449 Chronic obstructive pulmonary disease, unspecified: Secondary | ICD-10-CM | POA: Diagnosis present

## 2014-09-18 DIAGNOSIS — I63412 Cerebral infarction due to embolism of left middle cerebral artery: Secondary | ICD-10-CM | POA: Diagnosis present

## 2014-09-18 DIAGNOSIS — D696 Thrombocytopenia, unspecified: Secondary | ICD-10-CM

## 2014-09-18 DIAGNOSIS — I129 Hypertensive chronic kidney disease with stage 1 through stage 4 chronic kidney disease, or unspecified chronic kidney disease: Secondary | ICD-10-CM | POA: Diagnosis present

## 2014-09-18 DIAGNOSIS — R451 Restlessness and agitation: Secondary | ICD-10-CM

## 2014-09-18 DIAGNOSIS — Z515 Encounter for palliative care: Secondary | ICD-10-CM

## 2014-09-18 HISTORY — DX: Cerebral infarction, unspecified: I63.9

## 2014-09-18 LAB — CBG MONITORING, ED: Glucose-Capillary: 147 mg/dL — ABNORMAL HIGH (ref 70–99)

## 2014-09-18 LAB — COMPREHENSIVE METABOLIC PANEL
ALK PHOS: 39 U/L (ref 39–117)
ALT: 20 U/L (ref 0–35)
ANION GAP: 13 (ref 5–15)
AST: 13 U/L (ref 0–37)
Albumin: 2.8 g/dL — ABNORMAL LOW (ref 3.5–5.2)
BILIRUBIN TOTAL: 0.5 mg/dL (ref 0.3–1.2)
BUN: 26 mg/dL — AB (ref 6–23)
CHLORIDE: 100 meq/L (ref 96–112)
CO2: 24 mEq/L (ref 19–32)
Calcium: 9.1 mg/dL (ref 8.4–10.5)
Creatinine, Ser: 1.44 mg/dL — ABNORMAL HIGH (ref 0.50–1.10)
GFR, EST AFRICAN AMERICAN: 42 mL/min — AB (ref >90)
GFR, EST NON AFRICAN AMERICAN: 36 mL/min — AB (ref >90)
GLUCOSE: 158 mg/dL — AB (ref 70–99)
POTASSIUM: 4 meq/L (ref 3.7–5.3)
Sodium: 137 mEq/L (ref 137–147)
Total Protein: 6.8 g/dL (ref 6.0–8.3)

## 2014-09-18 LAB — PROTIME-INR
INR: 1.13 (ref 0.00–1.49)
Prothrombin Time: 14.5 seconds (ref 11.6–15.2)

## 2014-09-18 LAB — CBC WITH DIFFERENTIAL/PLATELET
BASOS ABS: 0 10*3/uL (ref 0.0–0.1)
Basophils Relative: 0 % (ref 0–1)
EOS PCT: 3 % (ref 0–5)
Eosinophils Absolute: 0.5 10*3/uL (ref 0.0–0.7)
HCT: 31.6 % — ABNORMAL LOW (ref 36.0–46.0)
Hemoglobin: 11.5 g/dL — ABNORMAL LOW (ref 12.0–15.0)
LYMPHS PCT: 6 % — AB (ref 12–46)
Lymphs Abs: 1 10*3/uL (ref 0.7–4.0)
MCH: 31.5 pg (ref 26.0–34.0)
MCHC: 36.4 g/dL — AB (ref 30.0–36.0)
MCV: 86.6 fL (ref 78.0–100.0)
MONO ABS: 1.2 10*3/uL — AB (ref 0.1–1.0)
Monocytes Relative: 7 % (ref 3–12)
Neutro Abs: 14 10*3/uL — ABNORMAL HIGH (ref 1.7–7.7)
Neutrophils Relative %: 84 % — ABNORMAL HIGH (ref 43–77)
Platelets: 42 10*3/uL — ABNORMAL LOW (ref 150–400)
RBC: 3.65 MIL/uL — ABNORMAL LOW (ref 3.87–5.11)
RDW: 16.1 % — AB (ref 11.5–15.5)
WBC: 16.6 10*3/uL — ABNORMAL HIGH (ref 4.0–10.5)

## 2014-09-18 LAB — URINALYSIS, ROUTINE W REFLEX MICROSCOPIC
BILIRUBIN URINE: NEGATIVE
GLUCOSE, UA: NEGATIVE mg/dL
HGB URINE DIPSTICK: NEGATIVE
KETONES UR: NEGATIVE mg/dL
Leukocytes, UA: NEGATIVE
Nitrite: NEGATIVE
PH: 5.5 (ref 5.0–8.0)
PROTEIN: NEGATIVE mg/dL
Specific Gravity, Urine: 1.02 (ref 1.005–1.030)
Urobilinogen, UA: 0.2 mg/dL (ref 0.0–1.0)

## 2014-09-18 LAB — MRSA PCR SCREENING: MRSA by PCR: NEGATIVE

## 2014-09-18 LAB — GLUCOSE, CAPILLARY: GLUCOSE-CAPILLARY: 124 mg/dL — AB (ref 70–99)

## 2014-09-18 LAB — TROPONIN I
TROPONIN I: 0.39 ng/mL — AB (ref <0.30)
Troponin I: 0.4 ng/mL (ref <0.30)

## 2014-09-18 MED ORDER — LEVOTHYROXINE SODIUM 100 MCG IV SOLR
INTRAVENOUS | Status: AC
  Filled 2014-09-18: qty 5

## 2014-09-18 MED ORDER — DEXTROSE 5 % IV SOLN
1.0000 g | Freq: Once | INTRAVENOUS | Status: AC
  Administered 2014-09-18: 1 g via INTRAVENOUS
  Filled 2014-09-18: qty 10

## 2014-09-18 MED ORDER — FUROSEMIDE 10 MG/ML IJ SOLN
20.0000 mg | Freq: Two times a day (BID) | INTRAMUSCULAR | Status: DC
  Administered 2014-09-18 – 2014-09-19 (×2): 20 mg via INTRAVENOUS
  Filled 2014-09-18 (×2): qty 2

## 2014-09-18 MED ORDER — LEVOTHYROXINE SODIUM 100 MCG IV SOLR
37.5000 ug | Freq: Every day | INTRAVENOUS | Status: DC
  Administered 2014-09-18: 37.5 ug via INTRAVENOUS
  Filled 2014-09-18 (×2): qty 5

## 2014-09-18 MED ORDER — ASPIRIN 300 MG RE SUPP
RECTAL | Status: AC
  Filled 2014-09-18: qty 1

## 2014-09-18 MED ORDER — CLINDAMYCIN PHOSPHATE 600 MG/50ML IV SOLN
600.0000 mg | Freq: Three times a day (TID) | INTRAVENOUS | Status: DC
  Administered 2014-09-18 – 2014-09-23 (×14): 600 mg via INTRAVENOUS
  Filled 2014-09-18 (×20): qty 50

## 2014-09-18 MED ORDER — HYDRALAZINE HCL 20 MG/ML IJ SOLN: 10.0000 mg | Freq: Four times a day (QID) | INTRAMUSCULAR | Status: DC | PRN

## 2014-09-18 MED ORDER — PANTOPRAZOLE SODIUM 40 MG IV SOLR
40.0000 mg | Freq: Two times a day (BID) | INTRAVENOUS | Status: DC
  Administered 2014-09-18 – 2014-10-01 (×26): 40 mg via INTRAVENOUS
  Filled 2014-09-18 (×28): qty 40

## 2014-09-18 MED ORDER — FLUTICASONE PROPIONATE 50 MCG/ACT NA SUSP
2.0000 | Freq: Every day | NASAL | Status: DC
  Administered 2014-09-20 – 2014-10-01 (×10): 2 via NASAL
  Filled 2014-09-18 (×2): qty 16

## 2014-09-18 MED ORDER — STROKE: EARLY STAGES OF RECOVERY BOOK
Freq: Once | Status: AC
  Administered 2014-09-19: 10:00:00
  Filled 2014-09-18: qty 1

## 2014-09-18 MED ORDER — SODIUM CHLORIDE 0.9 % IV SOLN: INTRAVENOUS | Status: AC

## 2014-09-18 MED ORDER — CLINDAMYCIN PHOSPHATE 600 MG/50ML IV SOLN
INTRAVENOUS | Status: AC
  Filled 2014-09-18: qty 50

## 2014-09-18 MED ORDER — LEVOTHYROXINE SODIUM 100 MCG IV SOLR
37.5000 ug | Freq: Every day | INTRAVENOUS | Status: DC
  Administered 2014-09-19 – 2014-09-23 (×5): 37.5 ug via INTRAVENOUS
  Filled 2014-09-18 (×8): qty 5

## 2014-09-18 MED ORDER — FUROSEMIDE 10 MG/ML IJ SOLN: 20.0000 mg | Freq: Two times a day (BID) | INTRAMUSCULAR | Status: DC

## 2014-09-18 MED ORDER — ASPIRIN 325 MG PO TABS
325.0000 mg | ORAL_TABLET | Freq: Every day | ORAL | Status: DC
  Administered 2014-09-25 – 2014-09-30 (×3): 325 mg via ORAL
  Filled 2014-09-18 (×6): qty 1

## 2014-09-18 MED ORDER — DEXTROSE 5 % IV SOLN
500.0000 mg | Freq: Once | INTRAVENOUS | Status: AC
  Administered 2014-09-18: 500 mg via INTRAVENOUS
  Filled 2014-09-18: qty 500

## 2014-09-18 MED ORDER — ASPIRIN 300 MG RE SUPP
300.0000 mg | Freq: Every day | RECTAL | Status: DC
  Administered 2014-09-18 – 2014-09-29 (×10): 300 mg via RECTAL
  Filled 2014-09-18 (×9): qty 1

## 2014-09-18 MED ORDER — BUDESONIDE-FORMOTEROL FUMARATE 160-4.5 MCG/ACT IN AERO
2.0000 | INHALATION_SPRAY | Freq: Every day | RESPIRATORY_TRACT | Status: DC | PRN
  Filled 2014-09-18: qty 6

## 2014-09-18 NOTE — ED Notes (Signed)
According to patient's daughter, pt was asleep at 1100, which was initially last seen normal time documented, being reported by EMS, with back turned to family, family unsure if pt was normal at that time.  Last seen normal, awake and responding was at 0030 this morning.

## 2014-09-18 NOTE — ED Provider Notes (Addendum)
CSN: [id number]     Arrival date & time 09/18/14  1540 History   First MD Initiated Contact with Patient 09/18/14 1544     Chief Complaint  Patient presents with  . Code Stroke   Chief complaint altered mental status @EDPCLEARED @ (Consider location/radiation/quality/duration/timing/severity/associated sxs/prior Treatment) HPI.... level V caveat for urgent need for intervention.   Patient arrived via EMS with a chief complaint altered mental status.  According to her daughters, she was acting normally at midnight last night. Her daughter went to visit her at approximately 11:30 this morning and she was noted to be sleeping and hence, was not aroused. The daughter returned approximately 2 PM and the patient was on the floor not responding to commands. There apparently was a weak grip on the left side. Daughter noted she was not answering questions and called EMS.  In emergency department, patient did not respond to direct questioning. She appears to gaze to the left and her left hand mood at times but not to command. Daughters state that she is normally alert and able to do normal activities of daily living.  Past Medical History  Diagnosis Date  . Vertigo, intermittent   . Hypothyroidism   . Hyperlipidemia   . Obesity   . Hypertension     severe and resistant to treatment; 2008-negative left renal angiogram  . Vitiligo   . Degenerative disc disease, lumbar   . Degenerative disc disease, cervical   . Chest pain 2008    2008-normal coronary angiography  . Renal cell carcinoma     Right nephrectomy   Past Surgical History  Procedure Laterality Date  . Tubal ligation  1983    Bilateral  . Nephrectomy      Right; secondary to renal cell carcinoma  . Colonoscopy N/A 04/21/2014    Procedure: COLONOSCOPY;  Surgeon: Rogene Houston, MD;  [facility]: [facility];  Service: Endoscopy;  Laterality: N/A;  . Colonoscopy N/A 04/21/2014    Procedure: COLONOSCOPY;  Surgeon: Rogene Houston, MD;   Location: [facility];  Service: Endoscopy;  Laterality: N/A;  1030   Family History  Problem Relation Age of Onset  . Heart disease Mother 90  . Hypertension Mother   . Colon cancer Father   . Hypertension Sister     x2  . Diabetes Sister     x2  . Heart failure Sister   . Lupus Brother    History  Substance Use Topics  . Smoking status: Never Smoker   . Smokeless tobacco: Never Used  . Alcohol Use: No   OB History   Grav Para Term Preterm Abortions TAB SAB Ect Mult Living                 Review of Systems  Unable to perform ROS: Acuity of condition      Allergies  Morphine  Home Medications   Prior to Admission medications   Medication Sig Start Date End Date Taking? Authorizing Provider  acyclovir (ZOVIRAX) 400 MG tablet Take 1 tablet (400 mg total) by mouth daily. 08/30/14  Yes Farrel Gobble, MD  amLODipine (NORVASC) 10 MG tablet Take 10 mg by mouth every morning.   Yes Historical Provider, MD  aspirin 81 MG chewable tablet Chew 81 mg by mouth daily.   Yes Historical Provider, MD  atenolol (TENORMIN) 25 MG tablet Take 25 mg by mouth every morning.    Yes Historical Provider, MD  Bortezomib (VELCADE IJ) Inject as directed. Days 1, 4,  8, 11 every 21 days 09/05/14  Yes Historical Provider, MD  budesonide-formoterol (SYMBICORT) 160-4.5 MCG/ACT inhaler Inhale 2 puffs into the lungs daily as needed (Shortness Of Breath).    Yes Historical Provider, MD  calcitRIOL (ROCALTROL) 0.25 MCG capsule 0.25 mcg every Monday, Wednesday, and Friday.    Yes Historical Provider, MD  dexamethasone (DECADRON) 4 MG tablet Take 10 mg by mouth as directed. Take a dose the day before and the day after Velcade treatments.   Yes Historical Provider, MD  FLUoxetine (PROZAC) 10 MG tablet Take 10 mg by mouth every morning.   Yes Historical Provider, MD  fluticasone (FLONASE) 50 MCG/ACT nasal spray Place 2 sprays into both nostrils daily. 08/26/14  Yes Farrel Gobble, MD  furosemide (LASIX) 40  MG tablet Take 40 mg by mouth 2 (two) times daily.   Yes Historical Provider, MD  hydrALAZINE (APRESOLINE) 25 MG tablet Take 25 mg by mouth 3 (three) times daily.  06/26/12  Yes Fayrene Helper, MD  hydrALAZINE (APRESOLINE) 50 MG tablet Take 50 mg by mouth 3 (three) times daily.   Yes Historical Provider, MD  lenalidomide (REVLIMID) 25 MG capsule Take 25 mg by mouth as directed. Take 1 capsule daily for 14 days, rest 7 days, and repeat. 08/30/14  Yes Farrel Gobble, MD  levothyroxine (SYNTHROID, LEVOTHROID) 100 MCG tablet Take 100 mcg by mouth every Monday, Wednesday, and Friday.   Yes Historical Provider, MD  levothyroxine (SYNTHROID, LEVOTHROID) 50 MCG tablet Take 50 mcg by mouth every Tuesday, Thursday, Saturday, and Sunday.   Yes Historical Provider, MD  lovastatin (MEVACOR) 40 MG tablet Take 40 mg by mouth daily.   Yes Historical Provider, MD  meclizine (ANTIVERT) 25 MG tablet Take 25 mg by mouth 3 (three) times daily as needed for dizziness.   Yes Historical Provider, MD  Multiple Vitamin (MULTIVITAMIN WITH MINERALS) TABS tablet Take 1 tablet by mouth daily.   Yes Historical Provider, MD  naproxen sodium (ALEVE) 220 MG tablet Take 220 mg by mouth 2 (two) times daily as needed.   Yes Historical Provider, MD  prochlorperazine (COMPAZINE) 10 MG tablet Take 1 tablet (10 mg total) by mouth every 6 (six) hours as needed for nausea or vomiting. 08/29/14  Yes Farrel Gobble, MD  spironolactone (ALDACTONE) 25 MG tablet Take 50 mg by mouth daily.    Yes Historical Provider, MD   BP 157/70  Pulse 69  Resp 18  SpO2 94% Physical Exam  Nursing note and vitals reviewed. Constitutional:  Patient is not responding to direct questions, obese  HENT:  Head: Normocephalic and atraumatic.  Eyes: Conjunctivae are normal.  Unable to examine extraocular movements  Neck: Normal range of motion. Neck supple.  Cardiovascular: Normal rate and regular rhythm.   Pulmonary/Chest: Effort normal and breath sounds  normal.  Abdominal: Soft. Bowel sounds are normal.  Musculoskeletal:  Unable  Neurological:  Patient is not alert. Does not move extremities to direct command  Skin: Skin is warm and dry.  Psychiatric:  Unable    ED Course  Procedures (including critical care time) Labs Review Labs Reviewed  CBC WITH DIFFERENTIAL - Abnormal; Notable for the following:    WBC 16.6 (*)    RBC 3.65 (*)    Hemoglobin 11.5 (*)    HCT 31.6 (*)    MCHC 36.4 (*)    RDW 16.1 (*)    Platelets 42 (*)    Neutrophils Relative % 84 (*)    Neutro Abs 14.0 (*)  Lymphocytes Relative 6 (*)    Monocytes Absolute 1.2 (*)    All other components within normal limits  COMPREHENSIVE METABOLIC PANEL - Abnormal; Notable for the following:    Glucose, Bld 158 (*)    BUN 26 (*)    Creatinine, Ser 1.44 (*)    Albumin 2.8 (*)    GFR calc non Af Amer 36 (*)    GFR calc Af Amer 42 (*)    All other components within normal limits  TROPONIN I - Abnormal; Notable for the following:    Troponin I 0.39 (*)    All other components within normal limits  CBG MONITORING, ED - Abnormal; Notable for the following:    Glucose-Capillary 147 (*)    All other components within normal limits  PROTIME-INR  URINALYSIS, ROUTINE W REFLEX MICROSCOPIC    Imaging Review Ct Head Wo Contrast  09/18/2014   CLINICAL DATA:  Code stroke, altered level of consciousness  EXAM: CT HEAD WITHOUT CONTRAST  TECHNIQUE: Contiguous axial images were obtained from the base of the skull through the vertex without intravenous contrast.  COMPARISON:  None  FINDINGS: Cavum septum pellucidum and vergae.  Otherwise normal ventricular morphology.  No midline shift.  Old high RIGHT frontal infarct.  Large acute LEFT MCA territory infarct involving the LEFT temporal and parietal lobes with effacement of sulci.  No definite dense middle cerebral artery sign.  No intracranial hemorrhage, mass lesion or extra-axial fluid collections.  Low-attenuation at posterior  aspect of LEFT cerebellar hemisphere may represent a small old infarct as well.  Minimal small vessel chronic ischemic changes of deep cerebral white matter.  No acute bone or sinus findings.  Bony excrescences are identified at the sphenoid wings bilaterally potentially developmental variants or exostoses though difficult to completely exclude calcified meningiomas, considered much less likely.  IMPRESSION: Large LEFT MCA territory infarct involving LEFT temporal and parietal lobes.  No acute intracranial hemorrhage or midline shift.  Old small high RIGHT frontal and questionable LEFT cerebellar infarcts.  Critical Value/emergent results were called by telephone at the time of interpretation on 09/18/2014 at 1556 hr to Dr. Nat Christen, who verbally acknowledged these results.   Electronically Signed   By: Lavonia Dana M.D.   On: 09/18/2014 15:58   Dg Chest Portable 1 View  09/18/2014   CLINICAL DATA:  Stroke.  EXAM: PORTABLE CHEST - 1 VIEW  COMPARISON:  Chest CT 05/11/2014  FINDINGS: Single view of the chest demonstrates low lung volumes. Patchy airspace densities in the left lung. There may be subtle airspace disease in the right lung. Heart size is accentuated by the low lung volumes.  IMPRESSION: Patchy airspace disease, particularly in the left lung. Findings are concerning for pneumonia or aspiration. Asymmetric pulmonary edema is also in the differential diagnosis.   Electronically Signed   By: Markus Daft M.D.   On: 09/18/2014 17:10     EKG Interpretation None     CRITICAL CARE Performed by: Nat Christen Total critical care time: 50 Critical care time was exclusive of sCode stroke was called immediately.eparately billable procedures and treating other patients. Critical care was necessary to treat or prevent imminent or life-threatening deterioration. Critical care was time spent personally by me on the following activities: development of treatment plan with patient and/or surrogate as well as  nursing, discussions with consultants, evaluation of patient's response to treatment, examination of patient, obtaining history from patient or surrogate, ordering and performing treatments and interventions, ordering and review of laboratory  studies, ordering and review of radiographic studies, pulse oximetry and re-evaluation of patient's condition. MDM   Final diagnoses:  Cerebral infarction due to unspecified mechanism   Code stroke was called immediately. I discussed her care with the neurologist on call and the family. CT scan revealed a large nonhemorrhagic stroke in the left middle cerebral artery distribution.  She was protecting her airway. Neurologist recommended against intubating her at this time. IV Rocephin, IV Zithromax started. Discussed with hospitalist on call.  I also discussed care with daughters and other family members.     Nat Christen, MD 09/18/14 1736  Nat Christen, MD 09/18/14 [phone]

## 2014-09-18 NOTE — ED Notes (Signed)
Pt family now stating that pt. Was last seen normal last night at 0030, pt was seen sleeping at 1100 this morning but was not

## 2014-09-18 NOTE — Progress Notes (Signed)
Text paged Dr Rogue Bussing about increasing Bp requesting PRN medication and parameters to keep Bp; orders received in the chart; will continue to monitor and document any changes in patient condition

## 2014-09-18 NOTE — H&P (Signed)
History and Physical  [name]:[id number] DOB: [birth year] DOA: 09/18/2014  Referring physician: Dr Adriana Simas, ED physician PCP: [name], MD   Chief Complaint: Altered Mental Status  HPI: Melissa [name] is a 68 y.o. female  With a history of obesity, hypothyroidism, hypertension, hyperlipidemia, history of kidney cancer status post right nephrectomy, pulmonary fibrosis, multiple myeloma currently undergoing chemotherapy. She currently has altered mental status is not able to provide a history. History is obtained by her daughters. She was brought to the hospital by EMS due to altered mental status. The patient was found by her daughter at approximately 2 PM. She had fallen on the ground at an unknown time and was found on the floor by her daughter. She was altered at that point and had weakness of her right side including right facial droop and was not verbalizing. She was last known to be well last night. Code stroke was called alone neurology determined not to proceed with TPA due to unknown time of onset.   Review of Systems:   Per family no recent complaints of headache, leg pain, palpitations, dizziness, lightheadedness  Past Medical History  Diagnosis Date  . Vertigo, intermittent   . Hypothyroidism   . Hyperlipidemia   . Obesity   . Hypertension     severe and resistant to treatment; 2008-negative left renal angiogram  . Vitiligo   . Degenerative disc disease, lumbar   . Degenerative disc disease, cervical   . Chest pain 2008    2008-normal coronary angiography  . Renal cell carcinoma     Right nephrectomy   Past Surgical History  Procedure Laterality Date  . Tubal ligation  1983    Bilateral  . Nephrectomy      Right; secondary to renal cell carcinoma  . Colonoscopy N/A 04/21/2014    Procedure: COLONOSCOPY;  Surgeon: Malissa Hippo, MD;  Location: [facility];  Service: Endoscopy;  Laterality: N/A;  . Colonoscopy N/A 04/21/2014    Procedure:  COLONOSCOPY;  Surgeon: Malissa Hippo, MD;  Location: [facility];  Service: Endoscopy;  Laterality: N/A;  1030   Social History:  reports that she has never smoked. She has never used smokeless tobacco. She reports that she does not drink alcohol or use illicit drugs. Patient lives at home & is able to participate in activities of daily living without assistance  Allergies  Allergen Reactions  . Morphine Nausea And Vomiting    Family History  Problem Relation Age of Onset  . Heart disease Mother 98  . Hypertension Mother   . Colon cancer Father   . Hypertension Sister     x2  . Diabetes Sister     x2  . Heart failure Sister   . Lupus Brother       Prior to Admission medications   Medication Sig Start Date End Date Taking? Authorizing Provider  acyclovir (ZOVIRAX) 400 MG tablet Take 1 tablet (400 mg total) by mouth daily. 08/30/14  Yes Alla German, MD  amLODipine (NORVASC) 10 MG tablet Take 10 mg by mouth every morning.   Yes Historical Provider, MD  aspirin 81 MG chewable tablet Chew 81 mg by mouth daily.   Yes Historical Provider, MD  atenolol (TENORMIN) 25 MG tablet Take 25 mg by mouth every morning.    Yes Historical Provider, MD  Bortezomib (VELCADE IJ) Inject as directed. Days 1, 4, 8, 11 every 21 days 09/05/14  Yes Historical Provider, MD  budesonide-formoterol (SYMBICORT) 160-4.5 MCG/ACT  inhaler Inhale 2 puffs into the lungs daily as needed (Shortness Of Breath).    Yes Historical Provider, MD  calcitRIOL (ROCALTROL) 0.25 MCG capsule 0.25 mcg every Monday, Wednesday, and Friday.    Yes Historical Provider, MD  dexamethasone (DECADRON) 4 MG tablet Take 10 mg by mouth as directed. Take a dose the day before and the day after Velcade treatments.   Yes Historical Provider, MD  FLUoxetine (PROZAC) 10 MG tablet Take 10 mg by mouth every morning.   Yes Historical Provider, MD  fluticasone (FLONASE) 50 MCG/ACT nasal spray Place 2 sprays into both nostrils daily. 08/26/14  Yes  Alla German, MD  furosemide (LASIX) 40 MG tablet Take 40 mg by mouth 2 (two) times daily.   Yes Historical Provider, MD  hydrALAZINE (APRESOLINE) 25 MG tablet Take 25 mg by mouth 3 (three) times daily.  06/26/12  Yes Kerri Perches, MD  hydrALAZINE (APRESOLINE) 50 MG tablet Take 50 mg by mouth 3 (three) times daily.   Yes Historical Provider, MD  lenalidomide (REVLIMID) 25 MG capsule Take 25 mg by mouth as directed. Take 1 capsule daily for 14 days, rest 7 days, and repeat. 08/30/14  Yes Alla German, MD  levothyroxine (SYNTHROID, LEVOTHROID) 100 MCG tablet Take 100 mcg by mouth every Monday, Wednesday, and Friday.   Yes Historical Provider, MD  levothyroxine (SYNTHROID, LEVOTHROID) 50 MCG tablet Take 50 mcg by mouth every Tuesday, Thursday, Saturday, and Sunday.   Yes Historical Provider, MD  lovastatin (MEVACOR) 40 MG tablet Take 40 mg by mouth daily.   Yes Historical Provider, MD  meclizine (ANTIVERT) 25 MG tablet Take 25 mg by mouth 3 (three) times daily as needed for dizziness.   Yes Historical Provider, MD  Multiple Vitamin (MULTIVITAMIN WITH MINERALS) TABS tablet Take 1 tablet by mouth daily.   Yes Historical Provider, MD  naproxen sodium (ALEVE) 220 MG tablet Take 220 mg by mouth 2 (two) times daily as needed.   Yes Historical Provider, MD  prochlorperazine (COMPAZINE) 10 MG tablet Take 1 tablet (10 mg total) by mouth every 6 (six) hours as needed for nausea or vomiting. 08/29/14  Yes Alla German, MD  spironolactone (ALDACTONE) 25 MG tablet Take 50 mg by mouth daily.    Yes Historical Provider, MD    Physical Exam: BP 157/70  Pulse 69  Resp 18  SpO2 94%  General: Elderly black female. Awake and alert and oriented x3. No acute cardiopulmonary distress.  Eyes: Pupils equal, round, reactive to light. Extraocular muscles are intact. Sclerae anicteric and noninjected.  ENT: External auditory canals are patent and tympanic membranes reflect a good cone of light. Moist mucosal  membranes.  Neck: Neck supple without lymphadenopathy. No carotid bruits. No masses palpated.  Cardiovascular: Regular rate with normal S1-S2 sounds. No murmurs, rubs, gallops auscultated. No JVD.  Respiratory: Moderate respiratory effort. Rales in the left base to mid lung field. No wheezing or rhonchi.   Abdomen: Soft, nontender, nondistended. Hyperactive bowel sounds. No masses or hepatosplenomegaly  Skin: Dry, warm to touch. 2+ dorsalis pedis and radial pulses. Musculoskeletal: No calf or leg pain. All major joints not erythematous nontender.  Psychiatric: Unable to determine  Neurologic: Gross right-sided facial droop. Patient not moving right arm or right leg. No localization. Weak left grasp.  Patient is alert, although unable to focus on exam.           Labs on Admission:  Basic Metabolic Panel:  Recent Labs Lab 09/12/14 1132 09/18/14 1558  NA  141 137  K 4.1 4.0  CL 102 100  CO2 27 24  GLUCOSE 113* 158*  BUN 31* 26*  CREATININE 1.37* 1.44*  CALCIUM 9.2 9.1   Liver Function Tests:  Recent Labs Lab 09/12/14 1132 09/18/14 1558  AST 14 13  ALT 22 20  ALKPHOS 36* 39  BILITOT 0.3 0.5  PROT 7.0 6.8  ALBUMIN 3.1* 2.8*   No results found for this basename: LIPASE, AMYLASE,  in the last 168 hours No results found for this basename: AMMONIA,  in the last 168 hours CBC:  Recent Labs Lab 09/12/14 1132 09/18/14 1558  WBC 12.0* 16.6*  NEUTROABS 9.2* 14.0*  HGB 11.0* 11.5*  HCT 30.4* 31.6*  MCV 86.9 86.6  PLT 211 42*   Cardiac Enzymes:  Recent Labs Lab 09/18/14 1558  TROPONINI 0.39*    BNP (last 3 results) No results found for this basename: PROBNP,  in the last 8760 hours CBG:  Recent Labs Lab 09/18/14 1622  GLUCAP 147*    Radiological Exams on Admission: Ct Head Wo Contrast  09/18/2014   CLINICAL DATA:  Code stroke, altered level of consciousness  EXAM: CT HEAD WITHOUT CONTRAST  TECHNIQUE: Contiguous axial images were obtained from the base of  the skull through the vertex without intravenous contrast.  COMPARISON:  None  FINDINGS: Cavum septum pellucidum and vergae.  Otherwise normal ventricular morphology.  No midline shift.  Old high RIGHT frontal infarct.  Large acute LEFT MCA territory infarct involving the LEFT temporal and parietal lobes with effacement of sulci.  No definite dense middle cerebral artery sign.  No intracranial hemorrhage, mass lesion or extra-axial fluid collections.  Low-attenuation at posterior aspect of LEFT cerebellar hemisphere may represent a small old infarct as well.  Minimal small vessel chronic ischemic changes of deep cerebral white matter.  No acute bone or sinus findings.  Bony excrescences are identified at the sphenoid wings bilaterally potentially developmental variants or exostoses though difficult to completely exclude calcified meningiomas, considered much less likely.  IMPRESSION: Large LEFT MCA territory infarct involving LEFT temporal and parietal lobes.  No acute intracranial hemorrhage or midline shift.  Old small high RIGHT frontal and questionable LEFT cerebellar infarcts.  Critical Value/emergent results were called by telephone at the time of interpretation on 09/18/2014 at 1556 hr to Dr. Donnetta Hutching, who verbally acknowledged these results.   Electronically Signed   By: Ulyses Southward M.D.   On: 09/18/2014 15:58   Dg Chest Portable 1 View  09/18/2014   CLINICAL DATA:  Stroke.  EXAM: PORTABLE CHEST - 1 VIEW  COMPARISON:  Chest CT 05/11/2014  FINDINGS: Single view of the chest demonstrates low lung volumes. Patchy airspace densities in the left lung. There may be subtle airspace disease in the right lung. Heart size is accentuated by the low lung volumes.  IMPRESSION: Patchy airspace disease, particularly in the left lung. Findings are concerning for pneumonia or aspiration. Asymmetric pulmonary edema is also in the differential diagnosis.   Electronically Signed   By: Richarda Overlie M.D.   On: 09/18/2014 17:10      EKG: Independently reviewed.  Sinus rhythm with normal QRS intervals. No acute ST elevation or depression.  Assessment/Plan Present on Admission:  . CVA (cerebral infarction) . Aspiration pneumonia . Thrombocytopenia, unspecified . Elevated troponin  #1 left MCA ischemic stroke Admit to ICU as patient has several other processes. We'll start the patient on aspirin and keep the patient n.p.o. as she concurrently has aspiration pneumonia  and had difficulty guarding her airway. At this point I see no reason for intubation given that her respiratory status is stable. Will maintain her blood pressure less than 180/105. We'll get MRI, MRA, carotid Dopplers, echocardiogram. Consult neurology  #2 aspiration pneumonia We'll start the patient on clindamycin. Continue the patient on n.p.o. Status.  #3 thrombocythemia Possibly due to chemotherapy. No new medications noted.  #4 elevated troponin Likely due to massive stroke. No EKG changes. We'll cycle troponins. Discussed this with Dr. Donnie Aho of cardiology who agreed with the management of this.  #5 multiple myeloma Patient just ended one cycle of treatment is a 7 day rest.  #6 hypertension Hold antihypertensives to maintain cranial perfusion  #7 hypothyroid Convert to IV. As the patient is treated with 100 mcg and 50 mcg alternating daily, we'll treat the patient with 37.5 micrograms IV daily.   DVT prophylaxis: SCDs as patient is thrombocytopenic  Consultants: Neurology in  Code Status: Full  Family Communication: Daughter's in the room. Daughter Isaias Sakai: [phone]   Disposition Plan: Pending  Time spent: 70 minutes  Candelaria Celeste, [place] [facility] Pager [phone]  **Disclaimer: This note may have been dictated with voice recognition software. Similar sounding words can inadvertently be transcribed and this note may contain transcription errors which may not have been corrected upon publication of note.**

## 2014-09-18 NOTE — ED Notes (Signed)
Last seen normal by family today at 66, upon their return, pt found in floor, alert, but does not respond.  Per ems, pt opens eyes to verbal, no verbal response.  CBG in route 188.  Narcan given in route by EMS with no response.

## 2014-09-18 NOTE — ED Notes (Signed)
Patient being transferred to ICU bed 6 as per order. Report given to [employer].

## 2014-09-18 NOTE — ED Notes (Signed)
Neurology consulted in patients room via teleneuro. Dr Lacinda Axon at bedside and spoke with neurologist.

## 2014-09-18 NOTE — ED Notes (Signed)
CRITICAL VALUE ALERT  Critical value received:  Troponin 0.39  Date of notification:  9/27  Time of notification:  3354  Critical value read back:Yes.    Nurse who received alert:  t Dhruvan Gullion rn  MD notified (1st page):  Lacinda Axon  Time of first page:  1650  MD notified (2nd page):  Time of second page:  Responding MD:    Time MD responded:

## 2014-09-18 NOTE — Progress Notes (Signed)
-  No show, patient admitted to hospital-  [facility]

## 2014-09-18 NOTE — Progress Notes (Signed)
CRITICAL VALUE ALERT  Critical value received:  Troponin I  Date of notification:  09/18/2014  Time of notification:  2254  Critical value read back:Yes.    Nurse who received alert:  Wonda Cheng, RN  MD notified (1st page):  Fredirick Maudlin  Time of first page: 2256  MD notified (2nd page):  Time of second page:  Responding MD:  Fredirick Maudlin  Time MD responded:  2257  No new orders received at this time.

## 2014-09-18 NOTE — ED Notes (Signed)
Neurology consult completed via

## 2014-09-18 NOTE — Progress Notes (Signed)
eLink Physician-Brief Progress Note Patient Name: Melissa [name] DOB: 1946-06-08 MRN: [id number]   Date of Service  09/18/2014  HPI/Events of Note  68 yr old obese female with multiple myeloma undergoing chemo with acute mental status change.  Patient found to have acute left MCA CVA and evidence of pneumonia.  Not TPA candidate.  Patient admitted to ICU  eICU Interventions  Orders reviewed Certainly concerned about further neurologic decline given size of infarct.       Intervention Category Evaluation Type: New Patient Evaluation  Mauri [place], P 09/18/2014, 7:16 PM

## 2014-09-18 NOTE — ED Notes (Signed)
Pt arrived via EMS, airway patent, pt to CT on EMS stretcher.

## 2014-09-19 ENCOUNTER — Other Ambulatory Visit (HOSPITAL_COMMUNITY): Payer: Medicare Other

## 2014-09-19 ENCOUNTER — Inpatient Hospital Stay (HOSPITAL_COMMUNITY): Payer: Medicare Other

## 2014-09-19 DIAGNOSIS — R7989 Other specified abnormal findings of blood chemistry: Secondary | ICD-10-CM

## 2014-09-19 DIAGNOSIS — N183 Chronic kidney disease, stage 3 unspecified: Secondary | ICD-10-CM

## 2014-09-19 DIAGNOSIS — I6350 Cerebral infarction due to unspecified occlusion or stenosis of unspecified cerebral artery: Secondary | ICD-10-CM

## 2014-09-19 DIAGNOSIS — D696 Thrombocytopenia, unspecified: Secondary | ICD-10-CM

## 2014-09-19 DIAGNOSIS — I059 Rheumatic mitral valve disease, unspecified: Secondary | ICD-10-CM

## 2014-09-19 DIAGNOSIS — J690 Pneumonitis due to inhalation of food and vomit: Secondary | ICD-10-CM

## 2014-09-19 LAB — BASIC METABOLIC PANEL
ANION GAP: 12 (ref 5–15)
BUN: 26 mg/dL — AB (ref 6–23)
CHLORIDE: 103 meq/L (ref 96–112)
CO2: 25 mEq/L (ref 19–32)
Calcium: 9 mg/dL (ref 8.4–10.5)
Creatinine, Ser: 1.54 mg/dL — ABNORMAL HIGH (ref 0.50–1.10)
GFR calc non Af Amer: 34 mL/min — ABNORMAL LOW (ref >90)
GFR, EST AFRICAN AMERICAN: 39 mL/min — AB (ref >90)
Glucose, Bld: 117 mg/dL — ABNORMAL HIGH (ref 70–99)
POTASSIUM: 4.1 meq/L (ref 3.7–5.3)
SODIUM: 140 meq/L (ref 137–147)

## 2014-09-19 LAB — CBC
HCT: 29.4 % — ABNORMAL LOW (ref 36.0–46.0)
HEMOGLOBIN: 10.6 g/dL — AB (ref 12.0–15.0)
MCH: 31.2 pg (ref 26.0–34.0)
MCHC: 36.1 g/dL — ABNORMAL HIGH (ref 30.0–36.0)
MCV: 86.5 fL (ref 78.0–100.0)
PLATELETS: 30 10*3/uL — AB (ref 150–400)
RBC: 3.4 MIL/uL — AB (ref 3.87–5.11)
RDW: 16.2 % — ABNORMAL HIGH (ref 11.5–15.5)
WBC: 12.6 10*3/uL — AB (ref 4.0–10.5)

## 2014-09-19 LAB — PRO B NATRIURETIC PEPTIDE: Pro B Natriuretic peptide (BNP): 1843 pg/mL — ABNORMAL HIGH (ref 0–125)

## 2014-09-19 LAB — BLOOD GAS, ARTERIAL
Acid-Base Excess: 1.9 mmol/L (ref 0.0–2.0)
Bicarbonate: 24.8 mEq/L — ABNORMAL HIGH (ref 20.0–24.0)
Drawn by: 27407
FIO2: 0.21 %
O2 Saturation: 91.1 %
PCO2 ART: 31.4 mmHg — AB (ref 35.0–45.0)
PH ART: 7.509 — AB (ref 7.350–7.450)
PO2 ART: 57.7 mmHg — AB (ref 80.0–100.0)
Patient temperature: 37
TCO2: 22.4 mmol/L (ref 0–100)

## 2014-09-19 LAB — LIPID PANEL
CHOL/HDL RATIO: 2.2 ratio
Cholesterol: 171 mg/dL (ref 0–200)
HDL: 79 mg/dL (ref >39)
LDL Cholesterol: 56 mg/dL (ref 0–99)
Triglycerides: 182 mg/dL — ABNORMAL HIGH (ref <150)
VLDL: 36 mg/dL (ref 0–40)

## 2014-09-19 LAB — GLUCOSE, CAPILLARY
GLUCOSE-CAPILLARY: 110 mg/dL — AB (ref 70–99)
Glucose-Capillary: 147 mg/dL — ABNORMAL HIGH (ref 70–99)
Glucose-Capillary: 99 mg/dL (ref 70–99)

## 2014-09-19 LAB — TROPONIN I
Troponin I: 0.33 ng/mL (ref <0.30)
Troponin I: 0.32 ng/mL (ref <0.30)

## 2014-09-19 LAB — HEMOGLOBIN A1C
Hgb A1c MFr Bld: 6.4 % — ABNORMAL HIGH (ref <5.7)
Mean Plasma Glucose: 137 mg/dL — ABNORMAL HIGH (ref <117)

## 2014-09-19 MED ORDER — CLINDAMYCIN PHOSPHATE 600 MG/50ML IV SOLN
INTRAVENOUS | Status: AC
Start: 2014-09-19 — End: 2014-09-19
  Filled 2014-09-19: qty 50

## 2014-09-19 MED ORDER — FUROSEMIDE 10 MG/ML IJ SOLN
40.0000 mg | Freq: Two times a day (BID) | INTRAMUSCULAR | Status: DC
  Administered 2014-09-19 – 2014-09-20 (×3): 40 mg via INTRAVENOUS
  Filled 2014-09-19 (×3): qty 4

## 2014-09-19 MED ORDER — ALBUTEROL SULFATE (2.5 MG/3ML) 0.083% IN NEBU
2.5000 mg | INHALATION_SOLUTION | Freq: Four times a day (QID) | RESPIRATORY_TRACT | Status: DC | PRN
  Administered 2014-09-19: 2.5 mg via RESPIRATORY_TRACT
  Filled 2014-09-19: qty 3

## 2014-09-19 NOTE — Progress Notes (Signed)
  Echocardiogram 2D Echocardiogram has been performed.  [place], [place] 09/19/2014, 10:37 AM

## 2014-09-19 NOTE — Plan of Care (Signed)
Problem: Acute Treatment Outcomes Goal: Neuro exam at baseline or improved Outcome: Progressing No changes in initial assessment; Neuro checks every 2 hours    Goal: BP within ordered parameters Outcome: Progressing Parameters met, PRN medications ordered if needed to maintain Goal: Airway maintained/protected Outcome: Progressing Maintaining HOB 30 degrees with suction set-up at bedside Goal: 02 Sats > 94% Outcome: Progressing O2 sats greater than 94% on 2L O2 [place] Goal: Hemodynamically stable Outcome: Progressing Vital signs are currently stable Goal: Prognosis discussed with family/patient as appropriate Outcome: Progressing Family has many questions, literature given to daughter

## 2014-09-19 NOTE — Consult Note (Addendum)
Referring Physician: Dr. Jolaine Artist    Chief Complaint: altered mental stats, stroke  HPI:                                                                                                                                         Melissa [name] is an 68 y.o. female with a past medical history significant for HTN, hyperlipidemia, renal cell carcinoma s/p right nephrectomy, multiple myeloma on chemotherapy, transferred to [facility] for further stroke management. Patient has altered mental status and seems to be aphasic, family is unavailable, and thus all clinical information was obtained from the chart. She was initially evaluated at [facility] where notes indicated that " she was brought to the hospital by EMS due to altered mental status. The patient was found by her daughter at approximately 2 PM. She had fallen on the ground at an unknown time and was found on the floor by her daughter. She was altered at that point and had weakness of her right side including right facial droop and was not verbalizing. She was last known to be well last night. Code stroke was called alone neurology determined not to proceed with TPA due to unknown time of onset". MRI/MRA brain" large acute left MCA infarct. Cytotoxic edema and mass effect but no associated hemorrhage at this time. Numerous scattered small acute infarcts in the bilateral MCA, right PCA, and bilateral PICA territories. Constellation most  compatible with recent embolic event (cardiac or proximal aortic  source). Left MCA occlusion in the M1 segment. Some reconstituted flow only in the anterior left MCA division. At this moment she is lethargic but open eyes on verbal commands. On aspirin. Date last known well:  Time last known well:  tPA Given: no, unknown LKW   Past Medical History  Diagnosis Date  . Vertigo, intermittent   . Hypothyroidism   . Hyperlipidemia   . Obesity   . Hypertension     severe and resistant to treatment; 2008-negative left renal  angiogram  . Vitiligo   . Degenerative disc disease, lumbar   . Degenerative disc disease, cervical   . Chest pain 2008    2008-normal coronary angiography  . Renal cell carcinoma     Right nephrectomy    Past Surgical History  Procedure Laterality Date  . Tubal ligation  1983    Bilateral  . Nephrectomy      Right; secondary to renal cell carcinoma  . Colonoscopy N/A 04/21/2014    Procedure: COLONOSCOPY;  Surgeon: Rogene Houston, MD;  Location: [facility];  Service: Endoscopy;  Laterality: N/A;  . Colonoscopy N/A 04/21/2014    Procedure: COLONOSCOPY;  Surgeon: Rogene Houston, MD;  Location: [facility];  Service: Endoscopy;  Laterality: N/A;  1030    Family History  Problem Relation Age of Onset  . Heart disease Mother 30  . Hypertension Mother   .  Colon cancer Father   . Hypertension Sister     x2  . Diabetes Sister     x2  . Heart failure Sister   . Lupus Brother    Social History:  reports that she has never smoked. She has never used smokeless tobacco. She reports that she does not drink alcohol or use illicit drugs.  Allergies:  Allergies  Allergen Reactions  . Morphine Nausea And Vomiting    Medications:                                                                                                                           I have reviewed the patient's current medications. Scheduled: . aspirin  300 mg Rectal Daily   Or  . aspirin  325 mg Oral Daily  . clindamycin (CLEOCIN) IV  600 mg Intravenous 3 times per day  . fluticasone  2 spray Each Nare Daily  . furosemide  40 mg Intravenous BID  . levothyroxine  37.5 mcg Intravenous Daily  . pantoprazole (PROTONIX) IV  40 mg Intravenous Q12H    ROS:  Unable to obtain due to mental status/                                                                                                                         History obtained from chart review    Physical exam: lethargic but open eyes, no apparent  distress. Blood pressure 171/52, pulse 79, temperature 99.4 F (37.4 C), temperature source Oral, resp. rate 15, height $RemoveBe'5\' 5"'dSnGQmMck$  (1.651 m), weight 107.8 kg (237 lb 10.5 oz), SpO2 98.00%. Head: normocephalic. Neck: supple, no bruits, no JVD. Cardiac: no murmurs. Lungs: clear. Abdomen: soft, no tender, no mass. Extremities: no edema. Neurologic Examination:                                                                                                      General: Mental Status: Lethargic, open eyes to verbal commands. 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 Cranial Nerves: Pupils 1.5 mm, poorly  reactive. Gaze preference to the left. Corneal reflex present bilaterally. Right face weakness. Tongue central. Motor: Significant for right hemiparesis Tone diminished in the right. Sensory: withdraw the right leg to pain Deep Tendon Reflexes:  1+ all over Plantars: Right: upgoing   Left: downgoing Cerebellar: Unable to test Gait: Unable to test   Results for orders placed during the hospital encounter of 09/18/14 (from the past 48 hour(s))  PROTIME-INR     Status: None   Collection Time    09/18/14  3:58 PM      Result Value Ref Range   Prothrombin Time 14.5  11.6 - 15.2 seconds   INR 1.13  0.00 - 1.49  CBC WITH DIFFERENTIAL     Status: Abnormal   Collection Time    09/18/14  3:58 PM      Result Value Ref Range   WBC 16.6 (*) 4.0 - 10.5 K/uL   RBC 3.65 (*) 3.87 - 5.11 MIL/uL   Hemoglobin 11.5 (*) 12.0 - 15.0 g/dL   HCT 31.6 (*) 36.0 - 46.0 %   MCV 86.6  78.0 - 100.0 fL   MCH 31.5  26.0 - 34.0 pg   MCHC 36.4 (*) 30.0 - 36.0 g/dL   RDW 16.1 (*) 11.5 - 15.5 %   Platelets 42 (*) 150 - 400 K/uL   Comment: SPECIMEN CHECKED FOR CLOTS     PLATELET COUNT CONFIRMED BY SMEAR   Neutrophils Relative % 84 (*) 43 - 77 %   Neutro Abs 14.0 (*) 1.7 - 7.7 K/uL   Lymphocytes Relative 6 (*) 12 - 46 %   Lymphs Abs 1.0  0.7 - 4.0 K/uL   Monocytes Relative 7  3 - 12 %   Monocytes Absolute 1.2 (*) 0.1 - 1.0  K/uL   Eosinophils Relative 3  0 - 5 %   Eosinophils Absolute 0.5  0.0 - 0.7 K/uL   Basophils Relative 0  0 - 1 %   Basophils Absolute 0.0  0.0 - 0.1 K/uL   WBC Morphology ATYPICAL LYMPHOCYTES    COMPREHENSIVE METABOLIC PANEL     Status: Abnormal   Collection Time    09/18/14  3:58 PM      Result Value Ref Range   Sodium 137  137 - 147 mEq/L   Potassium 4.0  3.7 - 5.3 mEq/L   Chloride 100  96 - 112 mEq/L   CO2 24  19 - 32 mEq/L   Glucose, Bld 158 (*) 70 - 99 mg/dL   BUN 26 (*) 6 - 23 mg/dL   Creatinine, Ser 1.44 (*) 0.50 - 1.10 mg/dL   Calcium 9.1  8.4 - 10.5 mg/dL   Total Protein 6.8  6.0 - 8.3 g/dL   Albumin 2.8 (*) 3.5 - 5.2 g/dL   AST 13  0 - 37 U/L   ALT 20  0 - 35 U/L   Alkaline Phosphatase 39  39 - 117 U/L   Total Bilirubin 0.5  0.3 - 1.2 mg/dL   GFR calc non Af Amer 36 (*) >90 mL/min   GFR calc Af Amer 42 (*) >90 mL/min   Comment: (NOTE)     The eGFR has been calculated using the CKD EPI equation.     This calculation has not been validated in all clinical situations.     eGFR's persistently <90 mL/min signify possible Chronic Kidney     Disease.   Anion gap 13  5 - 15  TROPONIN I  Status: Abnormal   Collection Time    09/18/14  3:58 PM      Result Value Ref Range   Troponin I 0.39 (*) <0.30 ng/mL   Comment:            Due to the release kinetics of cTnI,     a negative result within the first hours     of the onset of symptoms does not rule out     myocardial infarction with certainty.     If myocardial infarction is still suspected,     repeat the test at appropriate intervals.     CRITICAL RESULT CALLED TO, READ BACK BY AND VERIFIED WITH:     VOGLER,T AT 3903 BY GODFREY,O ON 09/18/14  CBG MONITORING, ED     Status: Abnormal   Collection Time    09/18/14  4:22 PM      Result Value Ref Range   Glucose-Capillary 147 (*) 70 - 99 mg/dL   Comment 1 Documented in Chart    URINALYSIS, ROUTINE W REFLEX MICROSCOPIC     Status: None   Collection Time     09/18/14  4:57 PM      Result Value Ref Range   Color, Urine YELLOW  YELLOW   APPearance CLEAR  CLEAR   Specific Gravity, Urine 1.020  1.005 - 1.030   pH 5.5  5.0 - 8.0   Glucose, UA NEGATIVE  NEGATIVE mg/dL   Hgb urine dipstick NEGATIVE  NEGATIVE   Bilirubin Urine NEGATIVE  NEGATIVE   Ketones, ur NEGATIVE  NEGATIVE mg/dL   Protein, ur NEGATIVE  NEGATIVE mg/dL   Urobilinogen, UA 0.2  0.0 - 1.0 mg/dL   Nitrite NEGATIVE  NEGATIVE   Leukocytes, UA NEGATIVE  NEGATIVE   Comment: MICROSCOPIC NOT DONE ON URINES WITH NEGATIVE PROTEIN, BLOOD, LEUKOCYTES, NITRITE, OR GLUCOSE <1000 mg/dL.  MRSA PCR SCREENING     Status: None   Collection Time    09/18/14  6:13 PM      Result Value Ref Range   MRSA by PCR NEGATIVE  NEGATIVE   Comment:            The GeneXpert MRSA Assay (FDA     approved for NASAL specimens     only), is one component of a     comprehensive MRSA colonization     surveillance program. It is not     intended to diagnose MRSA     infection nor to guide or     monitor treatment for     MRSA infections.  TROPONIN I     Status: Abnormal   Collection Time    09/18/14  9:27 PM      Result Value Ref Range   Troponin I 0.40 (*) <0.30 ng/mL   Comment:            Due to the release kinetics of cTnI,     a negative result within the first hours     of the onset of symptoms does not rule out     myocardial infarction with certainty.     If myocardial infarction is still suspected,     repeat the test at appropriate intervals.     CRITICAL RESULT CALLED TO, READ BACK BY AND VERIFIED WITH:     D.BREWER AT 2254 ON 09/18/14 BY S.VANHOORNE  GLUCOSE, CAPILLARY     Status: Abnormal   Collection Time    09/18/14 11:39 PM  Result Value Ref Range   Glucose-Capillary 124 (*) 70 - 99 mg/dL  HEMOGLOBIN A1C     Status: Abnormal   Collection Time    09/19/14  4:55 AM      Result Value Ref Range   Hemoglobin A1C 6.4 (*) <5.7 %   Comment: (NOTE)                                                                                According to the ADA Clinical Practice Recommendations for 2011, when     HbA1c is used as a screening test:      >=6.5%   Diagnostic of Diabetes Mellitus               (if abnormal result is confirmed)     5.7-6.4%   Increased risk of developing Diabetes Mellitus     References:Diagnosis and Classification of Diabetes Mellitus,Diabetes     WUJW,1191,47(WGNFA 1):S62-S69 and Standards of Medical Care in             Diabetes - 2011,Diabetes Care,2011,34 (Suppl 1):S11-S61.   Mean Plasma Glucose 137 (*) <117 mg/dL   Comment: Performed at [place]: Abnormal   Collection Time    09/19/14  4:55 AM      Result Value Ref Range   Cholesterol 171  0 - 200 mg/dL   Triglycerides 182 (*) <150 mg/dL   HDL 79  >39 mg/dL   Total CHOL/HDL Ratio 2.2     VLDL 36  0 - 40 mg/dL   LDL Cholesterol 56  0 - 99 mg/dL   Comment:            Total Cholesterol/HDL:CHD Risk     Coronary Heart Disease Risk Table                         Men   Women      1/2 Average Risk   3.4   3.3      Average Risk       5.0   4.4      2 X Average Risk   9.6   7.1      3 X Average Risk  23.4   11.0                Use the calculated Patient Ratio     above and the CHD Risk Table     to determine the patient's CHD Risk.                ATP III CLASSIFICATION (LDL):      <100     mg/dL   Optimal      100-129  mg/dL   Near or Above                        Optimal      130-159  mg/dL   Borderline      160-189  mg/dL   High      >190     mg/dL   Very High  TROPONIN I  Status: Abnormal   Collection Time    09/19/14  4:55 AM      Result Value Ref Range   Troponin I 0.33 (*) <0.30 ng/mL   Comment: CRITICAL VALUE NOTED.  VALUE IS CONSISTENT WITH PREVIOUSLY REPORTED AND CALLED VALUE.                Due to the release kinetics of cTnI,     a negative result within the first hours     of the onset of symptoms does not rule out     myocardial infarction  with certainty.     If myocardial infarction is still suspected,     repeat the test at appropriate intervals.  GLUCOSE, CAPILLARY     Status: Abnormal   Collection Time    09/19/14  5:57 AM      Result Value Ref Range   Glucose-Capillary 147 (*) 70 - 99 mg/dL  TROPONIN I     Status: Abnormal   Collection Time    09/19/14 10:04 AM      Result Value Ref Range   Troponin I 0.32 (*) <0.30 ng/mL   Comment: CRITICAL VALUE NOTED.  VALUE IS CONSISTENT WITH PREVIOUSLY REPORTED AND CALLED VALUE.                Due to the release kinetics of cTnI,     a negative result within the first hours     of the onset of symptoms does not rule out     myocardial infarction with certainty.     If myocardial infarction is still suspected,     repeat the test at appropriate intervals.  BASIC METABOLIC PANEL     Status: Abnormal   Collection Time    09/19/14 10:04 AM      Result Value Ref Range   Sodium 140  137 - 147 mEq/L   Potassium 4.1  3.7 - 5.3 mEq/L   Chloride 103  96 - 112 mEq/L   CO2 25  19 - 32 mEq/L   Glucose, Bld 117 (*) 70 - 99 mg/dL   BUN 26 (*) 6 - 23 mg/dL   Creatinine, Ser 1.54 (*) 0.50 - 1.10 mg/dL   Calcium 9.0  8.4 - 10.5 mg/dL   GFR calc non Af Amer 34 (*) >90 mL/min   GFR calc Af Amer 39 (*) >90 mL/min   Comment: (NOTE)     The eGFR has been calculated using the CKD EPI equation.     This calculation has not been validated in all clinical situations.     eGFR's persistently <90 mL/min signify possible Chronic Kidney     Disease.   Anion gap 12  5 - 15  CBC     Status: Abnormal   Collection Time    09/19/14 10:04 AM      Result Value Ref Range   WBC 12.6 (*) 4.0 - 10.5 K/uL   RBC 3.40 (*) 3.87 - 5.11 MIL/uL   Hemoglobin 10.6 (*) 12.0 - 15.0 g/dL   HCT 29.4 (*) 36.0 - 46.0 %   MCV 86.5  78.0 - 100.0 fL   MCH 31.2  26.0 - 34.0 pg   MCHC 36.1 (*) 30.0 - 36.0 g/dL   RDW 16.2 (*) 11.5 - 15.5 %   Platelets 30 (*) 150 - 400 K/uL   Comment: RESULT REPEATED AND VERIFIED      PLATELET COUNT CONFIRMED BY SMEAR     SPECIMEN CHECKED FOR CLOTS  PRO B NATRIURETIC PEPTIDE     Status: Abnormal   Collection Time    09/19/14 10:04 AM      Result Value Ref Range   Pro B Natriuretic peptide (BNP) 1843.0 (*) 0 - 125 pg/mL  GLUCOSE, CAPILLARY     Status: Abnormal   Collection Time    09/19/14 11:39 AM      Result Value Ref Range   Glucose-Capillary 110 (*) 70 - 99 mg/dL   Comment 1 Documented in Chart     Comment 2 Notify RN    BLOOD GAS, ARTERIAL     Status: Abnormal   Collection Time    09/19/14 11:45 AM      Result Value Ref Range   FIO2 0.21     pH, Arterial 7.509 (*) 7.350 - 7.450   pCO2 arterial 31.4 (*) 35.0 - 45.0 mmHg   pO2, Arterial 57.7 (*) 80.0 - 100.0 mmHg   Bicarbonate 24.8 (*) 20.0 - 24.0 mEq/L   TCO2 22.4  0 - 100 mmol/L   Acid-Base Excess 1.9  0.0 - 2.0 mmol/L   O2 Saturation 91.1     Patient temperature 37.0     Collection site RIGHT RADIAL     Drawn by [phone]     Sample type ARTERIAL DRAW     Allens test (pass/fail) PASS  PASS  GLUCOSE, CAPILLARY     Status: None   Collection Time    09/19/14  5:48 PM      Result Value Ref Range   Glucose-Capillary 99  70 - 99 mg/dL   Comment 1 Documented in Chart     Comment 2 Notify RN     Ct Head Wo Contrast  09/18/2014   CLINICAL DATA:  Code stroke, altered level of consciousness  EXAM: CT HEAD WITHOUT CONTRAST  TECHNIQUE: Contiguous axial images were obtained from the base of the skull through the vertex without intravenous contrast.  COMPARISON:  None  FINDINGS: Cavum septum pellucidum and vergae.  Otherwise normal ventricular morphology.  No midline shift.  Old high RIGHT frontal infarct.  Large acute LEFT MCA territory infarct involving the LEFT temporal and parietal lobes with effacement of sulci.  No definite dense middle cerebral artery sign.  No intracranial hemorrhage, mass lesion or extra-axial fluid collections.  Low-attenuation at posterior aspect of LEFT cerebellar hemisphere may represent a  small old infarct as well.  Minimal small vessel chronic ischemic changes of deep cerebral white matter.  No acute bone or sinus findings.  Bony excrescences are identified at the sphenoid wings bilaterally potentially developmental variants or exostoses though difficult to completely exclude calcified meningiomas, considered much less likely.  IMPRESSION: Large LEFT MCA territory infarct involving LEFT temporal and parietal lobes.  No acute intracranial hemorrhage or midline shift.  Old small high RIGHT frontal and questionable LEFT cerebellar infarcts.  Critical Value/emergent results were called by telephone at the time of interpretation on 09/18/2014 at 1556 hr to Dr. Nat Christen, who verbally acknowledged these results.   Electronically Signed   By: Lavonia Dana M.D.   On: 09/18/2014 15:58   Mr Virgel Paling LD Contrast  09/19/2014   ADDENDUM REPORT: 09/19/2014 10:07  ADDENDUM: Study discussed by telephone with Dr. Roderic Palau On 09/19/2014 at 0958 hrs.   Electronically Signed   By: Lars Pinks M.D.   On: 09/19/2014 10:07   09/19/2014   CLINICAL DATA:  68 year old female with left MCA infarct detected by CT presenting with code stroke, altered mental  status. Initial encounter.  EXAM: MRI HEAD WITHOUT CONTRAST  MRA HEAD WITHOUT CONTRAST  TECHNIQUE: Multiplanar, multiecho pulse sequences of the brain and surrounding structures were obtained without intravenous contrast. Angiographic images of the head were obtained using MRA technique without contrast.  COMPARISON:  Head CT without contrast 09/18/2014.  FINDINGS: MRI HEAD FINDINGS  Large confluent and intensely restricted diffusion in the left hemisphere extending from the insula and operculum posteriorly, involving most of the left occipital lobe as well as the posterior temporal lobe. Small nodular areas of restricted diffusion in the left basal ganglia, which otherwise are spared. Superimposed multiple bilateral punctate and nodular foci of restricted diffusion, including  involvement of the right MCA and PCA territories. Furthermore there are multiple nodular foci restricted diffusion in both cerebellar hemispheres. See series 100.  Cytotoxic edema in the affected territories. Still, intracranial mass effect is Mild at this time, with Trace rightward midline shift. No definite petechial hemorrhage.  Major intracranial vascular flow voids are preserved.  Cavum septum pellucidum. Mass effect on the left lateral ventricle. No ventriculomegaly. Basilar cisterns remain patent. Negative pituitary, cervicomedullary junction, and grossly negative visualized cervical spine. Chronic lacunar infarcts superimposed in the right corona radiata and right superior frontal gyrus subcortical white matter (with small focus of cortical encephalomalacia).  Visualized orbit soft tissues are within normal limits. Visualized paranasal sinuses and mastoids are clear. Normal bone marrow signal. No acute scalp soft tissue findings.  MRA HEAD FINDINGS  Antegrade flow in the posterior circulation with codominant distal vertebral arteries. Patent vertebrobasilar junction. Right PICA origin is patent. Left PICA less well visualized. Basilar artery irregularity without hemodynamically significant stenosis. Both PCA origins are patent. The right posterior communicating artery is present, the left is diminutive or absent. Bilateral PCA branches are within normal limits.  Antegrade flow in both ICA siphons. Bilateral siphon irregularity is moderate. No definite hemodynamically significant ICA siphon stenosis. Patent carotid termini. Normal right MCA and bilateral ACA origins.  The left MCA is occluded in the M1 segment 10 mm beyond its origin. Paucity of flow signal only in the left MCA anterior division M2 branches.  Anterior communicating artery diminutive or absent. Visualized bilateral ACA branches are within normal limits. Right MCA M1 segment and bifurcation are patent with mild to moderate irregularity. No  definite M2 branch occlusion on the right.  IMPRESSION: 1. Large acute left MCA infarct. Cytotoxic edema and mass effect but no associated hemorrhage at this time. 2. Numerous scattered small acute infarcts in the bilateral MCA, right PCA, and bilateral PICA territories. Constellation most compatible with recent embolic event (cardiac or proximal aortic source). 3. Left MCA occlusion in the M1 segment. Some reconstituted flow only in the anterior left MCA division. 4. No other major circle of Willis branch occlusion. Moderate irregularity of both ICA siphons.  Electronically Signed: By: Lars Pinks M.D. On: 09/19/2014 09:44   Mr Brain Wo Contrast  09/19/2014   ADDENDUM REPORT: 09/19/2014 10:07  ADDENDUM: Study discussed by telephone with Dr. Roderic Palau On 09/19/2014 at 0958 hrs.   Electronically Signed   By: Lars Pinks M.D.   On: 09/19/2014 10:07   09/19/2014   CLINICAL DATA:  68 year old female with left MCA infarct detected by CT presenting with code stroke, altered mental status. Initial encounter.  EXAM: MRI HEAD WITHOUT CONTRAST  MRA HEAD WITHOUT CONTRAST  TECHNIQUE: Multiplanar, multiecho pulse sequences of the brain and surrounding structures were obtained without intravenous contrast. Angiographic images of the head were obtained using  MRA technique without contrast.  COMPARISON:  Head CT without contrast 09/18/2014.  FINDINGS: MRI HEAD FINDINGS  Large confluent and intensely restricted diffusion in the left hemisphere extending from the insula and operculum posteriorly, involving most of the left occipital lobe as well as the posterior temporal lobe. Small nodular areas of restricted diffusion in the left basal ganglia, which otherwise are spared. Superimposed multiple bilateral punctate and nodular foci of restricted diffusion, including involvement of the right MCA and PCA territories. Furthermore there are multiple nodular foci restricted diffusion in both cerebellar hemispheres. See series 100.  Cytotoxic  edema in the affected territories. Still, intracranial mass effect is Mild at this time, with Trace rightward midline shift. No definite petechial hemorrhage.  Major intracranial vascular flow voids are preserved.  Cavum septum pellucidum. Mass effect on the left lateral ventricle. No ventriculomegaly. Basilar cisterns remain patent. Negative pituitary, cervicomedullary junction, and grossly negative visualized cervical spine. Chronic lacunar infarcts superimposed in the right corona radiata and right superior frontal gyrus subcortical white matter (with small focus of cortical encephalomalacia).  Visualized orbit soft tissues are within normal limits. Visualized paranasal sinuses and mastoids are clear. Normal bone marrow signal. No acute scalp soft tissue findings.  MRA HEAD FINDINGS  Antegrade flow in the posterior circulation with codominant distal vertebral arteries. Patent vertebrobasilar junction. Right PICA origin is patent. Left PICA less well visualized. Basilar artery irregularity without hemodynamically significant stenosis. Both PCA origins are patent. The right posterior communicating artery is present, the left is diminutive or absent. Bilateral PCA branches are within normal limits.  Antegrade flow in both ICA siphons. Bilateral siphon irregularity is moderate. No definite hemodynamically significant ICA siphon stenosis. Patent carotid termini. Normal right MCA and bilateral ACA origins.  The left MCA is occluded in the M1 segment 10 mm beyond its origin. Paucity of flow signal only in the left MCA anterior division M2 branches.  Anterior communicating artery diminutive or absent. Visualized bilateral ACA branches are within normal limits. Right MCA M1 segment and bifurcation are patent with mild to moderate irregularity. No definite M2 branch occlusion on the right.  IMPRESSION: 1. Large acute left MCA infarct. Cytotoxic edema and mass effect but no associated hemorrhage at this time. 2. Numerous  scattered small acute infarcts in the bilateral MCA, right PCA, and bilateral PICA territories. Constellation most compatible with recent embolic event (cardiac or proximal aortic source). 3. Left MCA occlusion in the M1 segment. Some reconstituted flow only in the anterior left MCA division. 4. No other major circle of Willis branch occlusion. Moderate irregularity of both ICA siphons.  Electronically Signed: By: Lars Pinks M.D. On: 09/19/2014 09:44   Dg Chest Portable 1 View  09/18/2014   CLINICAL DATA:  Stroke.  EXAM: PORTABLE CHEST - 1 VIEW  COMPARISON:  Chest CT 05/11/2014  FINDINGS: Single view of the chest demonstrates low lung volumes. Patchy airspace densities in the left lung. There may be subtle airspace disease in the right lung. Heart size is accentuated by the low lung volumes.  IMPRESSION: Patchy airspace disease, particularly in the left lung. Findings are concerning for pneumonia or aspiration. Asymmetric pulmonary edema is also in the differential diagnosis.   Electronically Signed   By: Markus Daft M.D.   On: 09/18/2014 17:10     Assessment: 68 y.o. female with large left MCA distribution infarct with mild edema as well as numerous scattered small acute infarcts in the bilateral MCA, right PCA, and bilateral PICA territories, pattern consistent with  cerebral embolism. No a candidate for thrombolysis due to unknown time of onset. Stroke work up underway. Continue aspirin pending results stroke work up. Stroke team will follow up in am.  Stroke Risk Factors -  HTN, hyperlipidemia  Plan: 1. HgbA1c, fasting lipid panel 2. MRI, MRA  of the brain without contrast 3. Echocardiogram 4. Carotid dopplers 5. Prophylactic therapy-aspirin 6. Risk factor modification 7. Telemetry monitoring 8. Frequent neuro checks 9. PT/OT SLP  Dorian Pod, MD [facility] [phone]  09/19/2014, 7:52 PM

## 2014-09-19 NOTE — Progress Notes (Signed)
Pt transferred to 3S via carelink in stable condition; family aware of transfer; pt belongings sent with family; report given to [facility]

## 2014-09-19 NOTE — Progress Notes (Signed)
Nutrition Brief Note  Patient identified as nutrition risk due to low braden score.   Wt Readings from Last 15 Encounters:  09/15/14 245 lb 3.2 oz (111.222 kg)  09/12/14 246 lb 6.4 oz (111.766 kg)  09/08/14 247 lb (112.038 kg)  09/05/14 244 lb 6.4 oz (110.859 kg)  08/26/14 243 lb 12.8 oz (110.587 kg)  08/05/14 244 lb 3.2 oz (110.768 kg)  05/04/14 242 lb 6.4 oz (109.952 kg)  04/29/14 243 lb 3.2 oz (110.315 kg)  04/20/14 243 lb (110.224 kg)  04/15/14 243 lb 9.6 oz (110.496 kg)  03/22/14 249 lb (112.946 kg)  11/01/13 244 lb (110.678 kg)  08/02/13 247 lb (112.038 kg)  07/22/13 245 lb 12.8 oz (111.494 kg)  06/09/13 240 lb 1.9 oz (108.918 kg)   68 y.o. female with a history of obesity, hypothyroidism, hypertension, hyperlipidemia, history of kidney cancer status post right nephrectomy, pulmonary fibrosis, multiple myeloma currently undergoing chemotherapy. She currently has altered mental status is not able to provide a history. History is obtained by her daughters. She was brought to the hospital by EMS due to altered mental status. The patient was found by her daughter at approximately 2 PM. She was altered at that point and had weakness of her right side including right facial droop and was not verbalizing. She was last known to be well last night. Code stroke was called alone neurology determined not to proceed with TPA due to unknown time of onset.  Chart reviewed. Pt with hx of multiple myeloma, currently being followed by [facility] and receiving chemotherapy. Weight has been stable.   Estimated body mass index is 40.17 kg/(m^2) as calculated from the following:   Height as of 04/20/14: 5' 5.5" (1.664 m).   Weight as of 09/15/14: 245 lb 3.2 oz (111.222 kg). Patient meets criteria for extreme obesity, class III based on current BMI.   Current diet order is NPO, patient is consuming approximately n/a% of meals at this time. Labs and medications reviewed.   No nutrition interventions warranted  at this time. If nutrition issues arise, please consult RD.   Chamika Cunanan A. Jimmye Norman, RD, LDN Pager: [phone]

## 2014-09-19 NOTE — Progress Notes (Signed)
[facility] PROGRESS NOTE  Marim Klopp Henton UXL:[id number] DOB: [birth year] DOA: 09/18/2014 PCP: [name], MD  Assessment/Plan: 1. Acute CVA. This patient was admitted to the hospital yesterday after she was found with altered mental status. She was also noted to have significant right-sided weakness in her upper and lower extremities. CT scan done in the emergency room indicated an acute left MCA infarct. Since onset of symptoms was unknown, she was not felt to be candidate for TPA. MRI of the brain done today shows large acute left MCA infarct with cytotoxic edema with mild mass effect. Case was discussed with Dr. Thad Ranger with neurology at [facility]. Patient will be transferred to [facility] for further neurology care. 2. Possible aspiration pneumonia. Patient was started on clindamycin yesterday for possible aspiration in the setting of her stroke. She is currently n.p.o. and needs a swallow evaluation by speech therapy 3. Acute CHF. BNP is elevated above prior levels. On chest exam, she does have crackles at her bases. She takes Lasix as an outpatient. We'll increase dose of IV Lasix from 20 mg to 40 mg BID. Continue to monitor intake and output. 4. Altered mental status, likely secondary to #1. Continue to follow. 5. Thrombocytopenia. Likely related to recent chemotherapy with last treatment being 9/24. Continue to monitor. 6. Hematuria. Likely related to trauma from Foley catheter. Continue to follow. If platelets continue to decline, she may need a platelet transfusion. 7. History multiple myeloma. Currently undergoing chemotherapy. 8. Elevated troponin. Likely due to massive stroke. No EKG changes. Echocardiogram has been ordered. 9.  Hypothyroidism. Currently on intravenous Synthroid.  Code Status: full code Family Communication: discussed with family at the bedside Disposition Plan:  Transfer to [facility], for further neurology care. Discussed with Dr. Sharon Seller who  has accepted the patient   Consultants:  Neurology  Procedures:    Antibiotics:  Clindamycin 9/27>>  HPI/Subjective: Patient is somnolent, does not answer questions or follow commands. Family feels that she has had some improvement overnight, that she has started to move her right side and is able to turn her head to the left now, which she was not doing yesterday.   Objective: Filed Vitals:   09/19/14 0700  BP: 156/65  Pulse: 74  Temp:   Resp: 17    Intake/Output Summary (Last 24 hours) at 09/19/14 0945 Last data filed at 09/19/14 0600  Gross per 24 hour  Intake 813.75 ml  Output    700 ml  Net 113.75 ml   There were no vitals filed for this visit.  Exam:   General:  Somnolent, does not answer questions or follow commands. She does open eyes  Cardiovascular: s1, s2, rrr  Respiratory: crackles at bases  Abdomen: soft, obese, nt, bs+  Musculoskeletal: no edema b/l   Data Reviewed: Basic Metabolic Panel:  Recent Labs Lab 09/12/14 1132 09/18/14 1558  NA 141 137  K 4.1 4.0  CL 102 100  CO2 27 24  GLUCOSE 113* 158*  BUN 31* 26*  CREATININE 1.37* 1.44*  CALCIUM 9.2 9.1   Liver Function Tests:  Recent Labs Lab 09/12/14 1132 09/18/14 1558  AST 14 13  ALT 22 20  ALKPHOS 36* 39  BILITOT 0.3 0.5  PROT 7.0 6.8  ALBUMIN 3.1* 2.8*   No results found for this basename: LIPASE, AMYLASE,  in the last 168 hours No results found for this basename: AMMONIA,  in the last 168 hours CBC:  Recent Labs Lab 09/12/14 1132 09/18/14 1558  WBC 12.0* 16.6*  NEUTROABS 9.2* 14.0*  HGB 11.0* 11.5*  HCT 30.4* 31.6*  MCV 86.9 86.6  PLT 211 42*   Cardiac Enzymes:  Recent Labs Lab 09/18/14 1558 09/18/14 2127 09/19/14 0455  TROPONINI 0.39* 0.40* 0.33*   BNP (last 3 results) No results found for this basename: PROBNP,  in the last 8760 hours CBG:  Recent Labs Lab 09/18/14 1622 09/18/14 2339 09/19/14 0557  GLUCAP 147* 124* 147*    Recent  Results (from the past 240 hour(s))  MRSA PCR SCREENING     Status: None   Collection Time    09/18/14  6:13 PM      Result Value Ref Range Status   MRSA by PCR NEGATIVE  NEGATIVE Final   Comment:            The GeneXpert MRSA Assay (FDA     approved for NASAL specimens     only), is one component of a     comprehensive MRSA colonization     surveillance program. It is not     intended to diagnose MRSA     infection nor to guide or     monitor treatment for     MRSA infections.     Studies: Ct Head Wo Contrast  09/18/2014   CLINICAL DATA:  Code stroke, altered level of consciousness  EXAM: CT HEAD WITHOUT CONTRAST  TECHNIQUE: Contiguous axial images were obtained from the base of the skull through the vertex without intravenous contrast.  COMPARISON:  None  FINDINGS: Cavum septum pellucidum and vergae.  Otherwise normal ventricular morphology.  No midline shift.  Old high RIGHT frontal infarct.  Large acute LEFT MCA territory infarct involving the LEFT temporal and parietal lobes with effacement of sulci.  No definite dense middle cerebral artery sign.  No intracranial hemorrhage, mass lesion or extra-axial fluid collections.  Low-attenuation at posterior aspect of LEFT cerebellar hemisphere may represent a small old infarct as well.  Minimal small vessel chronic ischemic changes of deep cerebral white matter.  No acute bone or sinus findings.  Bony excrescences are identified at the sphenoid wings bilaterally potentially developmental variants or exostoses though difficult to completely exclude calcified meningiomas, considered much less likely.  IMPRESSION: Large LEFT MCA territory infarct involving LEFT temporal and parietal lobes.  No acute intracranial hemorrhage or midline shift.  Old small high RIGHT frontal and questionable LEFT cerebellar infarcts.  Critical Value/emergent results were called by telephone at the time of interpretation on 09/18/2014 at 1556 hr to Dr. Donnetta Hutching, who  verbally acknowledged these results.   Electronically Signed   By: Ulyses Southward M.D.   On: 09/18/2014 15:58   Dg Chest Portable 1 View  09/18/2014   CLINICAL DATA:  Stroke.  EXAM: PORTABLE CHEST - 1 VIEW  COMPARISON:  Chest CT 05/11/2014  FINDINGS: Single view of the chest demonstrates low lung volumes. Patchy airspace densities in the left lung. There may be subtle airspace disease in the right lung. Heart size is accentuated by the low lung volumes.  IMPRESSION: Patchy airspace disease, particularly in the left lung. Findings are concerning for pneumonia or aspiration. Asymmetric pulmonary edema is also in the differential diagnosis.   Electronically Signed   By: Richarda Overlie M.D.   On: 09/18/2014 17:10    Scheduled Meds: .  stroke: mapping our early stages of recovery book   Does not apply Once  . sodium chloride   Intravenous STAT  . aspirin  300 mg  Rectal Daily   Or  . aspirin  325 mg Oral Daily  . clindamycin (CLEOCIN) IV  600 mg Intravenous 3 times per day  . fluticasone  2 spray Each Nare Daily  . furosemide  20 mg Intravenous BID  . levothyroxine  37.5 mcg Intravenous Daily  . pantoprazole (PROTONIX) IV  40 mg Intravenous Q12H   Continuous Infusions:   Active Problems:   CVA (cerebral infarction)   Aspiration pneumonia   Thrombocytopenia, unspecified   Elevated troponin   CKD (chronic kidney disease) stage 3, GFR 30-59 ml/min    Time spent:    MEMON,JEHANZEB  [facility] Pager [phone]. If 7PM-7AM, please contact night-coverage at [email or web address], password [facility] 09/19/2014, 9:45 AM  LOS: 1 day

## 2014-09-19 NOTE — Plan of Care (Signed)
Problem: Acute Treatment Outcomes Goal: Airway maintained/protected Outcome: Progressing Pt maintaining airway at present Goal: 02 Sats > 94% Outcome: Progressing Pt O2 Sats >94% on room air     Problem: Progression Outcomes Goal: Communication method established Outcome: Not Progressing Pt unable to follow commands; aphasic; unable to communicate at present

## 2014-09-19 NOTE — Plan of Care (Signed)
Problem: Consults Goal: Ischemic Stroke Patient Education See Patient Education Module for education specifics.  Outcome: Progressing Family given stroke education handout  Problem: Acute Treatment Outcomes Goal: Neuro exam at baseline or improved Outcome: Progressing Neuro exam unchanged    Goal: BP within ordered parameters Outcome: Progressing BP remains within ordered parameters

## 2014-09-19 NOTE — Progress Notes (Signed)
Pt arrived via Care link. Pt transferred to the bed. RN at bedside. Md notified pt arrived. Pt vitals obtained and charted. Pt assessed. Call bell in pt reach. Bed alarm turned on. Will continue to monitor pt.

## 2014-09-19 NOTE — Progress Notes (Signed)
SLP Cancellation Note  Patient Details Name: Andreika Vandagriff Baril MRN: [id number] DOB: 07/16/1946   Cancelled treatment:       Reason Eval/Treat Not Completed: Patient's level of consciousness;Other (comment) (Pt will be transferring to [facility] later today)   PORTER,DABNEY 09/19/2014, 1:41 PM

## 2014-09-19 NOTE — ED Notes (Signed)
Pt. Transferred to room 2 for possible intubation, report given to Blenda Peals RN.

## 2014-09-20 ENCOUNTER — Encounter (HOSPITAL_COMMUNITY): Payer: Self-pay | Admitting: Radiology

## 2014-09-20 ENCOUNTER — Inpatient Hospital Stay (HOSPITAL_COMMUNITY): Payer: Medicare Other

## 2014-09-20 ENCOUNTER — Ambulatory Visit (HOSPITAL_COMMUNITY): Payer: Medicare Other | Admitting: Oncology

## 2014-09-20 ENCOUNTER — Other Ambulatory Visit (HOSPITAL_COMMUNITY): Payer: Medicare Other

## 2014-09-20 DIAGNOSIS — R809 Proteinuria, unspecified: Secondary | ICD-10-CM

## 2014-09-20 DIAGNOSIS — I509 Heart failure, unspecified: Secondary | ICD-10-CM

## 2014-09-20 DIAGNOSIS — E785 Hyperlipidemia, unspecified: Secondary | ICD-10-CM

## 2014-09-20 DIAGNOSIS — I5031 Acute diastolic (congestive) heart failure: Secondary | ICD-10-CM

## 2014-09-20 DIAGNOSIS — I2789 Other specified pulmonary heart diseases: Secondary | ICD-10-CM

## 2014-09-20 DIAGNOSIS — C9000 Multiple myeloma not having achieved remission: Secondary | ICD-10-CM

## 2014-09-20 DIAGNOSIS — I10 Essential (primary) hypertension: Secondary | ICD-10-CM

## 2014-09-20 LAB — CBC WITH DIFFERENTIAL/PLATELET
Basophils Absolute: 0 10*3/uL (ref 0.0–0.1)
Basophils Relative: 0 % (ref 0–1)
EOS PCT: 2 % (ref 0–5)
Eosinophils Absolute: 0.2 10*3/uL (ref 0.0–0.7)
HEMATOCRIT: 28.7 % — AB (ref 36.0–46.0)
HEMOGLOBIN: 10 g/dL — AB (ref 12.0–15.0)
Lymphocytes Relative: 13 % (ref 12–46)
Lymphs Abs: 1.4 10*3/uL (ref 0.7–4.0)
MCH: 30.8 pg (ref 26.0–34.0)
MCHC: 34.8 g/dL (ref 30.0–36.0)
MCV: 88.3 fL (ref 78.0–100.0)
Monocytes Absolute: 1 10*3/uL (ref 0.1–1.0)
Monocytes Relative: 9 % (ref 3–12)
NEUTROS ABS: 8.1 10*3/uL — AB (ref 1.7–7.7)
NEUTROS PCT: 76 % (ref 43–77)
Platelets: 39 10*3/uL — ABNORMAL LOW (ref 150–400)
RBC: 3.25 MIL/uL — AB (ref 3.87–5.11)
RDW: 16.2 % — ABNORMAL HIGH (ref 11.5–15.5)
WBC: 10.7 10*3/uL — ABNORMAL HIGH (ref 4.0–10.5)

## 2014-09-20 LAB — BLOOD GAS, ARTERIAL
Acid-Base Excess: 1.9 mmol/L (ref 0.0–2.0)
Bicarbonate: 24.4 mEq/L — ABNORMAL HIGH (ref 20.0–24.0)
DRAWN BY: 40662
FIO2: 0.21 %
O2 Saturation: 94.4 %
PCO2 ART: 28.9 mmHg — AB (ref 35.0–45.0)
PH ART: 7.537 — AB (ref 7.350–7.450)
PO2 ART: 64.7 mmHg — AB (ref 80.0–100.0)
Patient temperature: 98.6
TCO2: 25.3 mmol/L (ref 0–100)

## 2014-09-20 LAB — COMPREHENSIVE METABOLIC PANEL
ALT: 29 U/L (ref 0–35)
AST: 28 U/L (ref 0–37)
Albumin: 2.3 g/dL — ABNORMAL LOW (ref 3.5–5.2)
Alkaline Phosphatase: 41 U/L (ref 39–117)
Anion gap: 14 (ref 5–15)
BUN: 25 mg/dL — ABNORMAL HIGH (ref 6–23)
CO2: 24 mEq/L (ref 19–32)
Calcium: 8.8 mg/dL (ref 8.4–10.5)
Chloride: 103 mEq/L (ref 96–112)
Creatinine, Ser: 1.59 mg/dL — ABNORMAL HIGH (ref 0.50–1.10)
GFR calc non Af Amer: 32 mL/min — ABNORMAL LOW (ref >90)
GFR, EST AFRICAN AMERICAN: 37 mL/min — AB (ref >90)
Glucose, Bld: 94 mg/dL (ref 70–99)
POTASSIUM: 3.6 meq/L — AB (ref 3.7–5.3)
SODIUM: 141 meq/L (ref 137–147)
TOTAL PROTEIN: 6.2 g/dL (ref 6.0–8.3)
Total Bilirubin: 0.7 mg/dL (ref 0.3–1.2)

## 2014-09-20 LAB — TROPONIN I: Troponin I: <0.3 ng/mL (ref <0.30)

## 2014-09-20 LAB — MAGNESIUM
MAGNESIUM: 2.2 mg/dL (ref 1.5–2.5)
MAGNESIUM: 2.2 mg/dL (ref 1.5–2.5)

## 2014-09-20 LAB — GLUCOSE, CAPILLARY
GLUCOSE-CAPILLARY: 99 mg/dL (ref 70–99)
Glucose-Capillary: 106 mg/dL — ABNORMAL HIGH (ref 70–99)

## 2014-09-20 MED ORDER — METHYLPREDNISOLONE SODIUM SUCC 125 MG IJ SOLR
60.0000 mg | INTRAMUSCULAR | Status: DC
  Filled 2014-09-20: qty 0.96

## 2014-09-20 MED ORDER — HYDROCORTISONE NA SUCCINATE PF 100 MG IJ SOLR
100.0000 mg | Freq: Three times a day (TID) | INTRAMUSCULAR | Status: DC
  Administered 2014-09-20 – 2014-09-21 (×3): 100 mg via INTRAVENOUS
  Filled 2014-09-20 (×5): qty 2

## 2014-09-20 MED ORDER — IPRATROPIUM-ALBUTEROL 0.5-2.5 (3) MG/3ML IN SOLN
3.0000 mL | Freq: Four times a day (QID) | RESPIRATORY_TRACT | Status: DC
  Administered 2014-09-20 – 2014-09-23 (×11): 3 mL via RESPIRATORY_TRACT
  Filled 2014-09-20 (×11): qty 3

## 2014-09-20 MED ORDER — POTASSIUM CHLORIDE 10 MEQ/100ML IV SOLN
10.0000 meq | INTRAVENOUS | Status: AC
  Administered 2014-09-20 (×3): 10 meq via INTRAVENOUS
  Filled 2014-09-20: qty 100

## 2014-09-20 MED ORDER — FUROSEMIDE 10 MG/ML IJ SOLN
80.0000 mg | Freq: Once | INTRAMUSCULAR | Status: AC
  Administered 2014-09-20: 80 mg via INTRAVENOUS
  Filled 2014-09-20: qty 8

## 2014-09-20 NOTE — Progress Notes (Signed)
Utilization Review Completed.Fabienne Nolasco T9/29/2015  

## 2014-09-20 NOTE — Progress Notes (Signed)
STROKE TEAM PROGRESS NOTE   HISTORY Melissa [name] is an 68 y.o. female with a past medical history significant for HTN, hyperlipidemia, renal cell carcinoma s/p right nephrectomy, multiple myeloma on chemotherapy, transferred to [facility] for further stroke management. Patient has altered mental status and seems to be aphasic, family is unavailable, and thus all clinical information was obtained from the chart. She was initially evaluated at [facility] where notes indicated that " she was brought to the hospital by EMS due to altered mental status. The patient was found by her daughter at approximately 2 PM. She had fallen on the ground at an unknown time and was found on the floor by her daughter. She was altered at that point and had weakness of her right side including right facial droop and was not verbalizing. She was last known to be well last night. Code stroke was called alone neurology determined not to proceed with TPA due to unknown time of onset". At time of consult, she is lethargic but open eyes on verbal commands. On aspirin. last known well unknown.   MRI/MRA brain" large acute left MCA infarct. Cytotoxic edema and mass effect but no associated hemorrhage at this time. Numerous scattered small acute infarcts in the bilateral MCA, right PCA, and bilateral PICA territories.  Constellation most compatible with recent embolic event (cardiac or proximal aortic source). Left MCA occlusion in the M1 segment. Some reconstituted flow only in the anterior left MCA division.   Patient was not administered TPA secondary to delay in arrival. She was admitted to a step down unit for further evaluation and treatment.   SUBJECTIVE (INTERVAL HISTORY) Her daughter is at the bedside.  Overall she feels her condition is unchanged over night. She reports that she took her mom to [facility]; they put on ultram. Afterwards, got nauseated and vomited. After that, she felt some better and was  able to rest. The next am patient continued to sleep. Other daughter came back Sun at 2p and found her mother on the floor. She has right sided weakness. No history of stroke. Multiple myeloma diagnosed end Aug/early Sept 2015; renal cancer 2010 with nephrectomy. Started multiple myeloma without signs of remission, treatment started in Sept. Daughter not aware of cancer stage.   OBJECTIVE Temp:  [98.3 F (36.8 C)-100.8 F (38.2 C)] 99.5 F (37.5 C) (09/29 0718) Pulse Rate:  [71-83] 72 (09/29 0718) Cardiac Rhythm:  [-] Normal sinus rhythm (09/29 0400) Resp:  [13-26] 14 (09/29 0718) BP: (135-185)/(47-142) 160/61 mmHg (09/29 0718) SpO2:  [91 %-98 %] 95 % (09/29 0718) Weight:  [107.8 kg (237 lb 10.5 oz)] 107.8 kg (237 lb 10.5 oz) (09/28 1900)   Recent Labs Lab 09/19/14 0557 09/19/14 1139 09/19/14 1748 09/20/14 0056 09/20/14 0613  GLUCAP 147* 110* 99 106* 99    Recent Labs Lab 09/18/14 1558 09/19/14 1004  NA 137 140  K 4.0 4.1  CL 100 103  CO2 24 25  GLUCOSE 158* 117*  BUN 26* 26*  CREATININE 1.44* 1.54*  CALCIUM 9.1 9.0    Recent Labs Lab 09/18/14 1558  AST 13  ALT 20  ALKPHOS 39  BILITOT 0.5  PROT 6.8  ALBUMIN 2.8*    Recent Labs Lab 09/18/14 1558 09/19/14 1004  WBC 16.6* 12.6*  NEUTROABS 14.0*  --   HGB 11.5* 10.6*  HCT 31.6* 29.4*  MCV 86.6 86.5  PLT 42* 30*    Recent Labs Lab 09/18/14 1558 09/18/14 2127  09/19/14 0455 09/19/14 1004  TROPONINI 0.39* 0.40* 0.33* 0.32*    Recent Labs  09/18/14 1558  LABPROT 14.5  INR 1.13    Recent Labs  09/18/14 1657  COLORURINE YELLOW  LABSPEC 1.020  PHURINE 5.5  GLUCOSEU NEGATIVE  HGBUR NEGATIVE  BILIRUBINUR NEGATIVE  KETONESUR NEGATIVE  PROTEINUR NEGATIVE  UROBILINOGEN 0.2  NITRITE NEGATIVE  LEUKOCYTESUR NEGATIVE       Component Value Date/Time   CHOL 171 09/19/2014 0455   TRIG 182* 09/19/2014 0455   HDL 79 09/19/2014 0455   CHOLHDL 2.2 09/19/2014 0455   VLDL 36 09/19/2014 0455   LDLCALC  56 09/19/2014 0455   Lab Results  Component Value Date   HGBA1C 6.4* 09/19/2014   No results found for this basename: labopia, cocainscrnur, labbenz, amphetmu, thcu, labbarb    No results found for this basename: ETH,  in the last 168 hours  Ct Head Wo Contrast 09/18/2014   Large LEFT MCA territory infarct involving LEFT temporal and parietal lobes.  No acute intracranial hemorrhage or midline shift.  Old small high RIGHT frontal and questionable LEFT cerebellar infarcts.    Mr Brain Wo Contrast 09/19/2014   1. Large acute left MCA infarct. Cytotoxic edema and mass effect but no associated hemorrhage at this time. 2. Numerous scattered small acute infarcts in the bilateral MCA, right PCA, and bilateral PICA territories. Constellation most compatible with recent embolic event (cardiac or proximal aortic source).   Mr Maxine Glenn Head Wo Contrast 09/19/2014   3. Left MCA occlusion in the M1 segment. Some reconstituted flow only in the anterior left MCA division. 4. No other major circle of Willis branch occlusion. Moderate irregularity of both ICA siphons.    Dg Chest Portable 1 View 09/18/2014    Patchy airspace disease, particularly in the left lung. Findings are concerning for pneumonia or aspiration. Asymmetric pulmonary edema is also in the differential diagnosis.     2D Echocardiogram  - Mild LVH with LVEF 60-65%, grade 1 diastolic dysfunction. Mild mitral regurgitation. Mildly dilated RV with normal contraction. Trivial tricuspid regurgitation, PASP 52 mm mercury. No definitive PFO or ASD. No pericardial effusion. No source of embolus.   CUS - pending  PHYSICAL EXAM Physical exam  Temp:  [98.3 F (36.8 C)-100.8 F (38.2 C)] 99.9 F (37.7 C) (09/29 1131) Pulse Rate:  [71-83] 74 (09/29 1131) Resp:  [13-22] 15 (09/29 1131) BP: (135-185)/(47-142) 158/58 mmHg (09/29 1131) SpO2:  [91 %-100 %] 100 % (09/29 1131) Weight:  [237 lb 10.5 oz (107.8 kg)] 237 lb 10.5 oz (107.8 kg) (09/28  1900)  General - obese, well developed, very lethargic and drowsy, not able to wake up.  Ophthalmologic - not able to see through.  Cardiovascular - Regular rate and rhythm with no murmur.  Neuro - very lethargic and hard to arouse, no language output. Mild right nasolabial fold flattening. PERRL, doll's eye present. Corneal present. On pain stimulation, LUE localize and LLE withdraw to pain. RUE and RLE trace withdraw to pain. Reflex diminished and bilateral babinski mute.  ASSESSMENT/PLAN  [name] is a 68 y.o. female with history of hypertension, hyperlipidemia, thyroid disease with increased size (bx held given dx multiple myeloma), renal cell cancer s/p nephrectomy and no recurrent at surgical bed, and recently diagnosed multiple myeloma on chemo presenting with sudden onset right hemiparesis. She did not receive IV t-PA due to delay in arrival. MRI imaging confirms a left MCA infarct, as well as numerous scattered small acute infarcts  in the bilateral MCA, right PCA, and bilateral PICA territories, consistent with embolic most likely secondary to either cardioembolic or hypercoagulable state from malignancy. Discussed with Dr. Clydene Laming, would like to gather more oncology info before go ahead with TEE and loop. .   Stroke:  Large left MCA infarct in setting of L MCA M1 occlusion, and numerous scattered small acute infarcts in the bilateral MCA, right PCA, and bilateral PICA territories, Consistent with embolic most likely secondary to either cardioembolic or hypercoagulable state from malignancy.    MRI  Large lefe MCA, but also Numerous scattered small acute infarcts in the bilateral MCA, right PCA, and bilateral PICA territories   MRA  L MCA M1 occlusion  Carotid Doppler  pending   2D Echo  No source of embolus  aspirin 81 mg orally every day prior to admission, now on aspirin 300 mg suppository  HgbA1c 6.4  SCDs for VTE prophylaxis  NPO - pending  speech  Bedrest  Resultant right hemiparesis, global aphasia, deviated gaze, dysphagia  Therapy recommendations:  pending   Ongoing aggressive risk factor management  Risk factor education  Disposition:  pending   Malignancy  - history of renal cell carcinoma, status post right radical nephrectomy in December 2008 with no evidence of disease on CT scan done on 04/20/2014 - 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 - presented with LBP and concerning for MM metastasis to spine?? - thyroid nodule enlargement as per family, bx on hold given acute cancer treatment  Hypertension   Permissive hypertension <220/120 for 24-48 hours and then gradually normalize within 5-7 days  BP goal long term normotensive  Norvasc, lasix prior to admission BP 135-185/61-64 past 24h (09/20/2014 @ 9:52 AM)  Stable  Hyperlipidemia  Home meds:  mevacor 40, not resumed in hospital due to NPO status  LDL 56, goal < 100 (<70 for diabetics)  Continue statin at discharge  Other Stroke Risk Factors Advanced age   Morbid obesity, Body mass index is 39.55 kg/(m^2).   Other Active Problems  Aspiration pnemonia, placed on clindamycin  Thrombocythemia, due to chemo   Elevated troponin, thought to be due to large stroke. Cardiology consulted.  Hypothyroid, enlarged lobe, bx on hold given acute cancer treatment  Depression, on treatment  COPD, on treatment  Other Pertinent History  CKD  Allergic rhinitis  Hospital day # 2  Burnetta Sabin, MSN, RN, ANVP-BC, ANP-BC, [facility] Pager: [phone] 09/20/2014 10:06 AM   I, the attending vascular neurologist, have personally obtained a history, examined the patient, evaluated laboratory data, individually viewed imaging studies, and formulated  the assessment and plan of care.  I have made any additions or clarifications directly to the above note and agree with the findings and plan as currently documented.   Rosalin Hawking, MD PhD Stroke Neurology 09/20/2014 4:18 PM   To contact Stroke Continuity provider, please refer to [email or web address] After hours, contact General Neurology

## 2014-09-20 NOTE — Progress Notes (Signed)
NTS patient at this time due to order for sputum induction. Sent down to Lab. RT will continue to assist as needed.

## 2014-09-20 NOTE — Progress Notes (Signed)
OT Cancellation Note  Patient Details Name: Melissa [name] MRN: [id number] DOB: 1946-04-25   Cancelled Treatment:    Reason Eval/Treat Not Completed: Other (comment) Pt on bedrest. Will assess when activity orders updated. [place], OTR/L  [phone] 09/20/2014 09/20/2014, 11:20 AM

## 2014-09-20 NOTE — Evaluation (Addendum)
Clinical/Bedside Swallow Evaluation Patient Details  Name: Melissa [name] MRN: [id number] Date of Birth: 07/24/1946  Today's Date: 09/20/2014 Time: 1348-1402 SLP Time Calculation (min): 14 min  Past Medical History:  Past Medical History  Diagnosis Date  . Vertigo, intermittent   . Hypothyroidism   . Hyperlipidemia   . Obesity   . Hypertension     severe and resistant to treatment; 2008-negative left renal angiogram  . Vitiligo   . Degenerative disc disease, lumbar   . Degenerative disc disease, cervical   . Chest pain 2008    2008-normal coronary angiography  . Renal cell carcinoma     Right nephrectomy   Past Surgical History:  Past Surgical History  Procedure Laterality Date  . Tubal ligation  1983    Bilateral  . Nephrectomy      Right; secondary to renal cell carcinoma  . Colonoscopy N/A 04/21/2014    Procedure: COLONOSCOPY;  Surgeon: Najeeb U Rehman, MD;  Location: [facility];  Service: Endoscopy;  Laterality: N/A;  . Colonoscopy N/A 04/21/2014    Procedure: COLONOSCOPY;  Surgeon: Najeeb U Rehman, MD;  Location: [facility];  Service: Endoscopy;  Laterality: N/A;  1030   HPI:  68 y.o. admitted with AMS and found down. PMH: obesity, hypothyroidism, hypertension, hyperlipidemia, history of kidney cancer status post right nephrectomy, pulmonary fibrosis, multiple myeloma currently undergoing chemotherapy. Found to have right sided weakness and facial droop and was not verbalizing.  MRI large acute left MCA infarct and numerous scattered small acute infarcts in the bilateral MCA, right PCA, and bilateral PICA territories.  CXR patchy airspace disease, particularly in the left lung. Findings are concerning for pneumonia or aspiration.   Assessment / Plan / Recommendation Clinical Impression  Pt. lethargic following large left CVA; unable to maintain arousal for greater than several seconds following max tactile and verbal stimuli.  She exhibited decreased secretion  management indicated by frequent throat clears prior to bolus administration. Labial seal and oral manipulation minimal leading to melted ice into right buccal cavity without swallow initiation.  Family present and educated on results and recommendations of continued NPO status, oral hygiene and ST intervention.  Cognitive-linguistic assessment to be completed next date.    Aspiration Risk  Severe    Diet Recommendation NPO        Other  Recommendations Oral Care Recommendations: Oral care BID   Follow Up Recommendations   (TBD)    Frequency and Duration min 2x/week  2 weeks   Pertinent Vitals/Pain No indications pain         Swallow Study        Oral/Motor/Sensory Function Overall Oral Motor/Sensory Function:  (will assess in diagnostic therapy)   Ice Chips Ice chips: Impaired Presentation: Spoon Oral Phase Impairments: Reduced labial seal;Reduced lingual movement/coordination;Impaired anterior to posterior transit;Poor awareness of bolus Oral Phase Functional Implications: Oral holding;Right lateral sulci pocketing (right labial residue ) Pharyngeal Phase Impairments:  (swallow not initiated)   Thin Liquid Thin Liquid: Not tested    Nectar Thick Nectar Thick Liquid: Not tested   Honey Thick Honey Thick Liquid: Not tested   Puree Puree: Not tested   Solid   GO    Solid: Not tested       ,  Willis 09/20/2014,2:48 PM   Willis  M.Ed CCC-SLP Pager [phone]      

## 2014-09-20 NOTE — Progress Notes (Signed)
PT Cancellation Note  Patient Details Name: Melissa [name] MRN: [id number] DOB: [birth year]   Cancelled Treatment:    Reason Eval/Treat Not Completed: Patient not medically ready. Pt on bedrest. Will assess when activity orders updated    Duncan Dull 09/20/2014, 11:23 AM Alben Deeds, PT DPT  [phone]

## 2014-09-20 NOTE — Progress Notes (Signed)
TEAM 1 - Stepdown/ICU TEAM Progress Note  Mitzie Schilke Peets NGE:[id number] DOB: May 29, 1946 DOA: 09/18/2014 PCP: [name], MD  Admit HPI / Brief Narrative: Melissa [name] is an 68 y.o.BF PMHx HTN, hyperlipidemia, renal cell carcinoma s/p right nephrectomy, multiple myeloma on chemotherapy(Revlimid (25 mg days 1-14 every 21 days)/Velcade (D1, 4, 8, and 11 every 21 days)/dexamethasone) last seen by Dr. Alla German (oncology) on 9/21, pulmonary fibrosis. Transferred to [facility] for further stroke management. Patient has altered mental status and seems to be aphasic, family is unavailable, and thus all clinical information was obtained from the chart. She was initially evaluated at [facility] where notes indicated that " she was brought to the hospital by EMS due to altered mental status. The patient was found by her daughter at approximately 2 PM. She had fallen on the ground at an unknown time and was found on the floor by her daughter. She was altered at that point and had weakness of her right side including right facial droop and was not verbalizing. She was last known to be well last night. Code stroke was called alone neurology determined not to proceed with TPA due to unknown time of onset". At time of consult, she is lethargic but open eyes on verbal commands. On aspirin. last known well unknown.    HPI/Subjective: 9/29 patient unresponsive, will open eyes to pain, will not follow commands. Per family one cycle of chemotherapy by Dr. Delton Prairie (oncology) in [facility]. Per daughter sometime between 1100-1400 patient fell and was down.  Assessment/Plan: left MCA ischemic stroke  -Allow permissive HTN  -Findings consistent with septic emboli vs coagulopathy from chemotherapy; no findings on TTE, patient not stable enough for TEE  -Obtain blood culture -Chest CT without contrast secondary to acute renal failure; metastasis/IVC thrombus, abdomen pelvis CT metastases? -Contact  Dr. Alla German (oncology) in the a.m. As Revlimid known to cause pneumonia and stroke. Could cause this type of pneumonia and stroke?  Aspiration pneumonia  -Continue clindamycin.  -Solu-Cortef 100 mg TID -DuoNeb -NTS for sputum sample for culture and stain -Continue the patient on n.p.o. Status.  -PCXR appears to also have overlying pulmonary edema continue Lasix 80 mg x1 then hold secondary to patient's BP -PCXR in a.m.  Diastolic CHF -Would like to start patient on Coreg however BP will not support especially in the face of new CVA. -Imdur were would also help with pulmonary hypertension however again BP will not support  Pulmonary hypertension -See diastolic CHF  HTN -See diastolic CHF   Elevated Troponin  -Likely due to massive stroke. No EKG changes. Will cycle troponins.  -Admitting team Discussed this with Dr. Donnie Aho (Cardiology) who agreed with the management of this;  Thrombocythemia  -Possibly due to chemotherapy. Slightly improved, no sign of bleeding    Hypokalemia -Potassium goal>4 -Potassium 10 mEq x3 -Check magnesium .  Multiple myeloma  -Patient just ended one cycle of treatment is a 7 day rest.   Hypertension  -Hold antihypertensives to maintain cranial perfusion  -Patient hypotensive for a stroke patients.  -Stress dose steroids Solu-Cortef 100 mg 3 times a day  Hypothyroid  Convert to IV. As the patient is treated with 100 mcg and 50 mcg alternating daily, will treat the patient with 37.5 micrograms IV daily.     Code Status: FULL Family Communication: no family present at time of exam Disposition Plan: Per stroke team    Consultants: Dr Marvel Plan (neurology stroke team)   Procedure/Significant Events:  9/27 CT head without contrast;Large LEFT MCA territory infarct involving LEFT temporal and  parietal lobes.  -Old small high RIGHT frontal & questionable LEFT cerebellar infarcts.  9/27 PCXR;Patchy airspace disease,>left lung. C/W  pneumonia vs aspiration vs Asymmetric pulmonary edema  9/28 MRI/MRA head without contrast; Large acute left MCA infarct. Cytotoxic edema and mass effect but no associated hemorrhage at this time.  -Numerous scattered small acute infarcts in the bilateral MCA/right PCA/bilateral PICA territories. most  C/W embolic event (cardiac or proximal aortic source).  -Left MCA occlusion in the M1 segment.  9/28 echocardiogram with contrast;- Left ventricle: mild LVH. -LVEF= 60% to 65%. -(grade 1 diastolic dysfunction). - Right ventricle: mildly dilated. - Right atrium: mildly dilated. - Pulmonary arteries: PA peak pressure: 52 mm Hg (S).    Culture 9/29 blood pending 9/29 sputum pending    Antibiotics: Clindamycin 9/27   DVT prophylaxis: SCD   Devices    LINES / TUBES:      Continuous Infusions:   Objective: VITAL SIGNS: Temp: 99.9 F (37.7 C) (09/29 1131) Temp src: Oral (09/29 1131) BP: 158/58 mmHg (09/29 1131) Pulse Rate: 74 (09/29 1131) SPO2; FIO2:   Intake/Output Summary (Last 24 hours) at 09/20/14 1305 Last data filed at 09/20/14 1132  Gross per 24 hour  Intake    400 ml  Output   2700 ml  Net  -2300 ml     Exam: General: No acute respiratory distress Lungs: Clear to auscultation bilaterally without wheezes or crackles Cardiovascular: Regular rate and rhythm without murmur gallop or rub normal S1 and S2 Abdomen: Nontender, nondistended, soft, bowel sounds positive, no rebound, no ascites, no appreciable mass Extremities: No significant cyanosis, clubbing, or edema bilateral lower extremities  Data Reviewed: Basic Metabolic Panel:  Recent Labs Lab 09/18/14 1558 09/19/14 1004  NA 137 140  K 4.0 4.1  CL 100 103  CO2 24 25  GLUCOSE 158* 117*  BUN 26* 26*  CREATININE 1.44* 1.54*  CALCIUM 9.1 9.0   Liver Function Tests:  Recent Labs Lab 09/18/14 1558  AST 13  ALT 20  ALKPHOS 39  BILITOT 0.5  PROT 6.8  ALBUMIN 2.8*   No results found for  this basename: LIPASE, AMYLASE,  in the last 168 hours No results found for this basename: AMMONIA,  in the last 168 hours CBC:  Recent Labs Lab 09/18/14 1558 09/19/14 1004  WBC 16.6* 12.6*  NEUTROABS 14.0*  --   HGB 11.5* 10.6*  HCT 31.6* 29.4*  MCV 86.6 86.5  PLT 42* 30*   Cardiac Enzymes:  Recent Labs Lab 09/18/14 1558 09/18/14 2127 09/19/14 0455 09/19/14 1004  TROPONINI 0.39* 0.40* 0.33* 0.32*   BNP (last 3 results)  Recent Labs  09/19/14 1004  PROBNP 1843.0*   CBG:  Recent Labs Lab 09/19/14 0557 09/19/14 1139 09/19/14 1748 09/20/14 0056 09/20/14 0613  GLUCAP 147* 110* 99 106* 99    Recent Results (from the past 240 hour(s))  MRSA PCR SCREENING     Status: None   Collection Time    09/18/14  6:13 PM      Result Value Ref Range Status   MRSA by PCR NEGATIVE  NEGATIVE Final   Comment:            The GeneXpert MRSA Assay (FDA     approved for NASAL specimens     only), is one component of a     comprehensive MRSA colonization     surveillance program. It is not  intended to diagnose MRSA     infection nor to guide or     monitor treatment for     MRSA infections.     Studies:  Recent x-ray studies have been reviewed in detail by the Attending Physician  Scheduled Meds:  Scheduled Meds: . aspirin  300 mg Rectal Daily   Or  . aspirin  325 mg Oral Daily  . clindamycin (CLEOCIN) IV  600 mg Intravenous 3 times per day  . fluticasone  2 spray Each Nare Daily  . furosemide  40 mg Intravenous BID  . levothyroxine  37.5 mcg Intravenous Daily  . pantoprazole (PROTONIX) IV  40 mg Intravenous Q12H    Time spent on care of this patient: 40 mins   Drema Dallas , MD   [facility] Office  [phone] Pager [phone]  On-Call/Text Page:      [email or web address]      password [email or web address]  If 7PM-7AM, please contact night-coverage [email or web address] Password [email or web address] 09/20/2014, 1:05 PM   LOS: 2 days

## 2014-09-20 NOTE — Progress Notes (Signed)
*  PRELIMINARY RESULTS* Vascular Ultrasound Carotid Duplex (Doppler) has been completed. Findings suggest 1-39% internal carotid artery stenosis bilaterally. Vertebral arteries are patent with antegrade flow.  09/20/2014 4:20 PM Maudry Mayhew, RVT, RDCS, RDMS

## 2014-09-21 ENCOUNTER — Inpatient Hospital Stay (HOSPITAL_COMMUNITY): Payer: Medicare Other

## 2014-09-21 DIAGNOSIS — Z85528 Personal history of other malignant neoplasm of kidney: Secondary | ICD-10-CM

## 2014-09-21 LAB — GLUCOSE, CAPILLARY
Glucose-Capillary: 118 mg/dL — ABNORMAL HIGH (ref 70–99)
Glucose-Capillary: 128 mg/dL — ABNORMAL HIGH (ref 70–99)
Glucose-Capillary: 130 mg/dL — ABNORMAL HIGH (ref 70–99)
Glucose-Capillary: 148 mg/dL — ABNORMAL HIGH (ref 70–99)
Glucose-Capillary: 82 mg/dL (ref 70–99)
Glucose-Capillary: 97 mg/dL (ref 70–99)

## 2014-09-21 LAB — BLOOD GAS, ARTERIAL
Acid-Base Excess: 2.9 mmol/L — ABNORMAL HIGH (ref 0.0–2.0)
Bicarbonate: 25.7 mEq/L — ABNORMAL HIGH (ref 20.0–24.0)
Drawn by: 35135
FIO2: 0.21 %
O2 Saturation: 94.2 %
PATIENT TEMPERATURE: 99.1
PCO2 ART: 32 mmHg — AB (ref 35.0–45.0)
TCO2: 26.7 mmol/L (ref 0–100)
pH, Arterial: 7.517 — ABNORMAL HIGH (ref 7.350–7.450)
pO2, Arterial: 68.3 mmHg — ABNORMAL LOW (ref 80.0–100.0)

## 2014-09-21 LAB — CBC WITH DIFFERENTIAL/PLATELET
BASOS ABS: 0 10*3/uL (ref 0.0–0.1)
BASOS PCT: 0 % (ref 0–1)
Eosinophils Absolute: 0.1 10*3/uL (ref 0.0–0.7)
Eosinophils Relative: 1 % (ref 0–5)
HEMATOCRIT: 31.1 % — AB (ref 36.0–46.0)
Hemoglobin: 10.9 g/dL — ABNORMAL LOW (ref 12.0–15.0)
Lymphocytes Relative: 11 % — ABNORMAL LOW (ref 12–46)
Lymphs Abs: 1.2 10*3/uL (ref 0.7–4.0)
MCH: 31.1 pg (ref 26.0–34.0)
MCHC: 35 g/dL (ref 30.0–36.0)
MCV: 88.6 fL (ref 78.0–100.0)
MONO ABS: 1 10*3/uL (ref 0.1–1.0)
Monocytes Relative: 9 % (ref 3–12)
Neutro Abs: 8.8 10*3/uL — ABNORMAL HIGH (ref 1.7–7.7)
Neutrophils Relative %: 79 % — ABNORMAL HIGH (ref 43–77)
Platelets: 54 10*3/uL — ABNORMAL LOW (ref 150–400)
RBC: 3.51 MIL/uL — ABNORMAL LOW (ref 3.87–5.11)
RDW: 16.4 % — AB (ref 11.5–15.5)
WBC: 11.1 10*3/uL — ABNORMAL HIGH (ref 4.0–10.5)

## 2014-09-21 LAB — COMPREHENSIVE METABOLIC PANEL
ALBUMIN: 2.4 g/dL — AB (ref 3.5–5.2)
ALK PHOS: 44 U/L (ref 39–117)
ALT: 60 U/L — ABNORMAL HIGH (ref 0–35)
AST: 51 U/L — AB (ref 0–37)
Anion gap: 15 (ref 5–15)
BILIRUBIN TOTAL: 0.8 mg/dL (ref 0.3–1.2)
BUN: 29 mg/dL — ABNORMAL HIGH (ref 6–23)
CHLORIDE: 105 meq/L (ref 96–112)
CO2: 23 mEq/L (ref 19–32)
Calcium: 9 mg/dL (ref 8.4–10.5)
Creatinine, Ser: 1.71 mg/dL — ABNORMAL HIGH (ref 0.50–1.10)
GFR calc Af Amer: 34 mL/min — ABNORMAL LOW (ref >90)
GFR calc non Af Amer: 30 mL/min — ABNORMAL LOW (ref >90)
Glucose, Bld: 120 mg/dL — ABNORMAL HIGH (ref 70–99)
Potassium: 4.2 mEq/L (ref 3.7–5.3)
Sodium: 143 mEq/L (ref 137–147)
Total Protein: 6.7 g/dL (ref 6.0–8.3)

## 2014-09-21 LAB — TROPONIN I
Troponin I: <0.3 ng/mL (ref <0.30)
Troponin I: <0.3 ng/mL (ref <0.30)

## 2014-09-21 LAB — MAGNESIUM: MAGNESIUM: 2.3 mg/dL (ref 1.5–2.5)

## 2014-09-21 MED ORDER — SODIUM CHLORIDE 0.9 % IV SOLN
INTRAVENOUS | Status: DC
  Administered 2014-09-21 – 2014-09-22 (×3): via INTRAVENOUS

## 2014-09-21 MED ORDER — METHYLPREDNISOLONE SODIUM SUCC 125 MG IJ SOLR
60.0000 mg | Freq: Two times a day (BID) | INTRAMUSCULAR | Status: DC
  Administered 2014-09-21 – 2014-09-24 (×6): 60 mg via INTRAVENOUS
  Filled 2014-09-21: qty 2
  Filled 2014-09-21: qty 0.96
  Filled 2014-09-21: qty 2
  Filled 2014-09-21: qty 0.96
  Filled 2014-09-21: qty 2
  Filled 2014-09-21 (×2): qty 0.96
  Filled 2014-09-21 (×2): qty 2

## 2014-09-21 MED ORDER — SODIUM CHLORIDE 0.9 % IV BOLUS (SEPSIS)
250.0000 mL | Freq: Once | INTRAVENOUS | Status: AC
  Administered 2014-09-21: 250 mL via INTRAVENOUS

## 2014-09-21 NOTE — Evaluation (Signed)
Review and agree with assessment and POC. Alben Deeds, [place] DPT  [phone]

## 2014-09-21 NOTE — Care Management Note (Addendum)
  Page 2 of 2   09/29/2014     10:43:27 AM CARE MANAGEMENT NOTE 09/29/2014  Patient:  Melissa [name], Melissa [name]   Account Number:  [id number]  Date Initiated:  09/21/2014  Documentation initiated by:  Marvetta Gibbons  Subjective/Objective Assessment:   Pt admitted with CVA     Action/Plan:   PTA pt lived at home- PT/OT/ST evals ordered   Anticipated DC Date:  09/23/2014   Anticipated DC Plan:  [place]  In-house referral  Clinical Social Worker      DC Planning Services  CM consult      Choice offered to / List presented to:             Status of service:  In process, will continue to follow Medicare Important Message given?  YES (If response is "NO", the following Medicare IM given date fields will be blank) Date Medicare IM given:  09/21/2014 Medicare IM given by:  Marvetta Gibbons Date Additional Medicare IM given:  09/29/2014 Additional Medicare IM given by:  Lorne Skeens  Discharge Disposition:    Per UR Regulation:  Reviewed for med. necessity/level of care/duration of stay  If discussed at [place] of Stay Meetings, dates discussed:    Comments:  09/29/14 [place], MSN, CM- Additional Medicare IM letter provided   09/26/14 [place], MSN, CM- Additional Medicare IM letter provided   09/23/14 [place], MSN, CM- Additional Medicare IM letter provided to patient's daughter Otila Kluver.    09/21/14- 1600- Marvetta Gibbons RN, BSN [phone] Spoke with family at bedside- current recommendation for SNF - discussed PT recommendation for rehab- CSW came to room during discussion to talk about SNF for rehab- at pt's current level of function would not be about to tolerate CIR level of therapy- family voiced understanding and are agreeable to SNF.

## 2014-09-21 NOTE — Progress Notes (Signed)
STROKE TEAM PROGRESS NOTE   HISTORY Melissa [name] is an 68 y.o. female with a past medical history significant for HTN, hyperlipidemia, renal cell carcinoma s/p right nephrectomy, multiple myeloma on chemotherapy, transferred to [facility] for further stroke management. Patient has altered mental status and seems to be aphasic, family is unavailable, and thus all clinical information was obtained from the chart. She was initially evaluated at [facility] where notes indicated that " she was brought to the hospital by EMS due to altered mental status. The patient was found by her daughter at approximately 2 PM. She had fallen on the ground at an unknown time and was found on the floor by her daughter. She was altered at that point and had weakness of her right side including right facial droop and was not verbalizing. She was last known to be well last night. Code stroke was called alone neurology determined not to proceed with TPA due to unknown time of onset". At time of consult, she is lethargic but open eyes on verbal commands. On aspirin. last known well unknown.   MRI/MRA brain" large acute left MCA infarct. Cytotoxic edema and mass effect but no associated hemorrhage at this time. Numerous scattered small acute infarcts in the bilateral MCA, right PCA, and bilateral PICA territories.  Constellation most compatible with recent embolic event (cardiac or proximal aortic source). Left MCA occlusion in the M1 segment. Some reconstituted flow only in the anterior left MCA division.   Patient was not administered TPA secondary to delay in arrival. She was admitted to a step down unit for further evaluation and treatment.   SUBJECTIVE (INTERVAL HISTORY) Sister in room but daughter not here. Pt is more awake alert than yesterday but still global aphasia.    OBJECTIVE Temp:  [97.6 F (36.4 C)-100.2 F (37.9 C)] 100.2 F (37.9 C) (09/30 0723) Pulse Rate:  [70-82] 77 (09/30 0900) Cardiac Rhythm:  [-] Normal  sinus rhythm (09/29 2317) Resp:  [12-28] 14 (09/30 0900) BP: (133-166)/(48-67) 136/53 mmHg (09/30 0900) SpO2:  [95 %-100 %] 98 % (09/30 0900)   Recent Labs Lab 09/20/14 0613 09/20/14 1132 09/20/14 1802 09/21/14 0038 09/21/14 0530  GLUCAP 99 97 82 118* 130*    Recent Labs Lab 09/18/14 1558 09/19/14 1004 09/20/14 1525 09/20/14 2155 09/21/14 0347  NA 137 140 141  --  143  K 4.0 4.1 3.6*  --  4.2  CL 100 103 103  --  105  CO2 $Re'24 25 24  'BmG$ --  23  GLUCOSE 158* 117* 94  --  120*  BUN 26* 26* 25*  --  29*  CREATININE 1.44* 1.54* 1.59*  --  1.71*  CALCIUM 9.1 9.0 8.8  --  9.0  MG  --   --  2.2 2.2 2.3    Recent Labs Lab 09/18/14 1558 09/20/14 1525 09/21/14 0347  AST 13 28 51*  ALT 20 29 60*  ALKPHOS 39 41 44  BILITOT 0.5 0.7 0.8  PROT 6.8 6.2 6.7  ALBUMIN 2.8* 2.3* 2.4*    Recent Labs Lab 09/18/14 1558 09/19/14 1004 09/20/14 1525 09/21/14 0347  WBC 16.6* 12.6* 10.7* 11.1*  NEUTROABS 14.0*  --  8.1* 8.8*  HGB 11.5* 10.6* 10.0* 10.9*  HCT 31.6* 29.4* 28.7* 31.1*  MCV 86.6 86.5 88.3 88.6  PLT 42* 30* 39* 54*    Recent Labs Lab 09/18/14 2127 09/19/14 0455 09/19/14 1004 09/20/14 2155 09/21/14 0347  TROPONINI 0.40* 0.33* 0.32* <0.30 <0.30    Recent Labs  09/18/14 1558  LABPROT 14.5  INR 1.13    Recent Labs  09/18/14 1657  COLORURINE YELLOW  LABSPEC 1.020  PHURINE 5.5  GLUCOSEU NEGATIVE  HGBUR NEGATIVE  BILIRUBINUR NEGATIVE  KETONESUR NEGATIVE  PROTEINUR NEGATIVE  UROBILINOGEN 0.2  NITRITE NEGATIVE  LEUKOCYTESUR NEGATIVE       Component Value Date/Time   CHOL 171 09/19/2014 0455   TRIG 182* 09/19/2014 0455   HDL 79 09/19/2014 0455   CHOLHDL 2.2 09/19/2014 0455   VLDL 36 09/19/2014 0455   LDLCALC 56 09/19/2014 0455   Lab Results  Component Value Date   HGBA1C 6.4* 09/19/2014   No results found for this basename: labopia,  cocainscrnur,  labbenz,  amphetmu,  thcu,  labbarb    No results found for this basename: ETH,  in the last 168  hours  Ct Head Wo Contrast 09/18/2014   Large LEFT MCA territory infarct involving LEFT temporal and parietal lobes.  No acute intracranial hemorrhage or midline shift.  Old small high RIGHT frontal and questionable LEFT cerebellar infarcts.    Mr Brain Wo Contrast 09/19/2014   1. Large acute left MCA infarct. Cytotoxic edema and mass effect but no associated hemorrhage at this time. 2. Numerous scattered small acute infarcts in the bilateral MCA, right PCA, and bilateral PICA territories. Constellation most compatible with recent embolic event (cardiac or proximal aortic source).   Mr Jodene Nam Head Wo Contrast 09/19/2014   3. Left MCA occlusion in the M1 segment. Some reconstituted flow only in the anterior left MCA division. 4. No other major circle of Willis branch occlusion. Moderate irregularity of both ICA siphons.    Dg Chest Portable 1 View 09/18/2014    Patchy airspace disease, particularly in the left lung. Findings are concerning for pneumonia or aspiration. Asymmetric pulmonary edema is also in the differential diagnosis.     2D Echocardiogram  - Mild LVH with LVEF 93-26%, grade 1 diastolic dysfunction. Mild mitral regurgitation. Mildly dilated RV with normal contraction. Trivial tricuspid regurgitation, PASP 52 mm mercury. No definitive PFO or ASD. No pericardial effusion. No source of embolus.   Carotid Doppler  No evidence of hemodynamically significant internal carotid artery stenosis - 1-39% internal carotid artery stenosis bilaterally. Vertebral artery flow is antegrade.   Ct Abdomen Pelvis Chest Wo Contrast 09/21/2014    1. New severe bilateral pulmonary infiltrates most consistent with pneumonia. Pulmonary edema could present in this fashion. 2. Endotracheal and right endobronchial soft tissue densities, most likely mucous plugging. Possibility of a and bronchial primary or metastatic lesion cannot be excluded. 3. Right nephrectomy. Nephrectomy bed appears stable. No recurrent mass. 4.  Mild subcutaneous edema noted over the lower flanks. Developing obstructive could present in this fashion. Clinical correlation is is suggested to exclude developing cellulitis or developing hematomas in these regions.     Venous doppler LEs - pending  PHYSICAL EXAM Temp:  [97.6 F (36.4 C)-100.2 F (37.9 C)] 100.2 F (37.9 C) (09/30 0723) Pulse Rate:  [70-82] 77 (09/30 0900) Resp:  [12-28] 14 (09/30 0900) BP: (133-166)/(48-67) 136/53 mmHg (09/30 0900) SpO2:  [95 %-100 %] 98 % (09/30 0900)  General - obese, well developed, very lethargic and drowsy, not able to wake up.  Ophthalmologic - not able to see through.  Cardiovascular - Regular rate and rhythm with no murmur.  Neuro - very lethargic and hard to arouse, no language output. Mild right nasolabial fold flattening. PERRL, doll's eye present. Corneal present. On pain stimulation, LUE localize and LLE  withdraw to pain. RUE and RLE trace withdraw to pain. Reflex diminished and bilateral babinski mute.  ASSESSMENT/PLAN Melissa [name] is a 68 y.o. female with history of hypertension, hyperlipidemia, thyroid disease with increased size (bx held given dx multiple myeloma), renal cell cancer s/p nephrectomy and no recurrent at surgical bed, and recently diagnosed multiple myeloma on chemo presenting with sudden onset right hemiparesis. She did not receive IV t-PA due to delay in arrival. MRI imaging confirms a left MCA infarct, as well as numerous scattered small acute infarcts in the bilateral MCA, right PCA, and bilateral PICA territories, consistent with embolic most likely secondary to either cardioembolic or hypercoagulable state from malignancy or chemo drug - induced (Revlimid).   Stroke:  Large left MCA infarct in setting of L MCA M1 occlusion, and numerous scattered small acute infarcts in the bilateral MCA, right PCA, and bilateral PICA territories, Consistent with embolic most likely secondary to either cardioembolic or  hypercoagulable state from malignancy.    MRI  Large lefe MCA, but also Numerous scattered small acute infarcts in the bilateral MCA, right PCA, and bilateral PICA territories   MRA  L MCA M1 occlusion  Carotid Doppler  No significant stenosis   2D Echo  No source of embolus  Recommend TEE to rule out cardiac source of emboli - discussed with Dr. Thereasa Solo  LE venous doppler - pending  aspirin 81 mg orally every day prior to admission, now on aspirin 300 mg suppository  HgbA1c 6.4  SCDs for VTE prophylaxis  NPO per [place] to be OOB. Will order.  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  presented with LBP and concerning for MM metastasis to spine??  thyroid nodule enlargement as per family, bx on hold given acute cancer treatment  CT chest R lung mucous plugging vs other etiology  Dr. Farrel Gobble - on Revlimid, known to have risk of PNA and stroke   Hypertension   Permissive hypertension <220/120 for 24-48 hours and then gradually normalize within 5-7 days  BP goal long term normotensive  Norvasc, lasix prior to admission BP 133-166/50-60 past 24h (09/21/2014 @ 10:37 AM)  Stable  Hyperlipidemia  Home meds:  mevacor 40, not resumed in hospital due to NPO status  LDL 56, goal < 100 (<70 for diabetics)  Continue statin at discharge  Other Stroke Risk Factors Advanced age   Morbid obesity, Body mass index is 39.55 kg/(m^2).    Other Active Problems  Aspiration pnemonia, placed on clindamycin  Thrombocythemia, due to chemo   Elevated troponin, thought to be due to large stroke. Cardiology consulted.  Hypothyroid, enlarged lobe, bx on hold given acute cancer treatment  Depression, on treatment  COPD, on treatment  CHF, pulmonary hypertension   Hypokalemia, resolved. K 4.2  hypothyroid  Other Pertinent History  CKD  Allergic rhinitis  Hospital day # 3  SHARON BIBY, MSN, RN, ANVP-BC, ANP-BC, [facility] Pager: [phone] 09/21/2014 10:37 AM  I, the attending vascular neurologist, have personally obtained a history, examined the patient, evaluated laboratory data, individually viewed imaging studies, and formulated the assessment and plan of care.  I have made any additions or clarifications directly to the above note and agree with the findings and plan as currently documented.   Rosalin Hawking, MD PhD Stroke Neurology 09/21/2014 4:14 PM   To contact Stroke Continuity provider, please refer to [email or web address] After hours, contact General Neurology

## 2014-09-21 NOTE — Progress Notes (Signed)
OT Cancellation Note  Patient Details Name: Melissa [name] MRN: [id number] DOB: 03/19/1946   Cancelled Treatment:    Reason Eval/Treat Not Completed: Patient not medically ready (bedrest )  Peri Maris Pager: [phone]  09/21/2014, 7:33 AM

## 2014-09-21 NOTE — Progress Notes (Signed)
[facility] TEAM 1 - Stepdown/ICU TEAM Progress Note  Melissa [name] ACZ:[id number] DOB: 03-Jul-1946 DOA: 09/18/2014 PCP: [name], MD  Admit HPI / Brief Narrative: 68 y.o F Hx HTN, hyperlipidemia, renal cell carcinoma s/p right nephrectomy, pulmonary fibrosis, multiple myeloma on chemotherapy (Revlimid 25 mg days 1-14 every 21 days  / Velcade D1, 4, 8, and 11 every 21 days / dexamethasone) last seen by Dr. Alla German (Oncology) on 9/21, who was transferred to [facility] for further stroke management. Patient has altered mental status and is aphasic.  She was initially evaluated at [facility] where notes indicated that she was brought to the hospital by EMS due to altered mental status. The patient was found by her daughter at approximately 2 PM. She had fallen on the ground at an unknown time and was found on the floor by her daughter. She was altered at that point and had weakness of her right side including right facial droop and was not verbalizing. She was last known to be well the previous night. Code stroke was called and Neurology determined not to proceed with TPA due to unknown time of onset.  HPI/Subjective: Pt opens eyes to stimuli, but makes no attempt to speak or follow commands.    Assessment/Plan:  Large Acute L MCA stroke and numerous scattered small acute infarcts in the bilateral MCA, right PCA, and bilateral PICA territories -Allow permissive HTN  -Findings consistent with cardiac/proximal aortic emboli vs coagulopathy from chemotherapy; no findings on TTE -discussed w/ Neurology - if pt continues to stabilize somewhat, will ask Cardiology on Thurs morning to schedule TEE (preferably prior to the weekend) -contact Dr. Alla German (Oncology) prior to d/c as Revlimid known to significantly increase risk of thromboembolism (to include CVA and even MI) - regardless, clearly not a current candidate for resumption of this drug   Aspiration pneumonia  -Continue clindamycin.    -Solumedrol 0.5 mg/kg IV every 12 hours for five days as adjunctive tx  -DuoNeb -NTS for sputum sample for culture and stain -Continue n.p.o. Status.   Diastolic CHF -Would like to start patient on Coreg however BP will not support especially in the face of new CVA. -Imdur were would also help with pulmonary hypertension however again BP will not support  Progressive renal failure -hydrate and follow   Pulmonary hypertension -See diastolic CHF  HTN -See diastolic CHF  Mildly Elevated Troponin  -Likely due to massive stroke. No EKG changes. Have since normalized  Thrombocythemia  -Likley due to chemotherapy. Slightly improved - follow  Hypokalemia -replaced to goal - Mg normal   Multiple myeloma  -Patient just ended one cycle of treatment and is on a planned 7 day rest - doubt her candidacy to return to active tx, unless dramatic improvement in clinical status occurs    Hypertension  -Hold antihypertensives to maintain cranial perfusion - BP improved today  Hypothyroid  Convert to IV. As the patient is treated with 100 mcg and 50 mcg alternating daily, will treat the patient with 37.5 micrograms IV daily.   Code Status: FULL Family Communication: no family present at time of exam Disposition Plan: Per stroke team  Consultants: Dr Marvel Plan (neurology stroke team)  Procedure/Significant Events: 9/27 CT head without contrast Large LEFT MCA territory infarct involving LEFT temporal and  parietal lobes.  -Old small high RIGHT frontal & questionable LEFT cerebellar infarcts.  9/27 PCXR Patchy airspace disease,>left lung. C/W pneumonia vs aspiration vs Asymmetric pulmonary edema  9/28 MRI/MRA head without contrast  Large acute left MCA infarct. Cytotoxic edema and mass effect but no associated hemorrhage at this time.  -Numerous scattered small acute infarcts in the bilateral MCA/right PCA/bilateral PICA territories. most  C/W embolic event (cardiac or proximal aortic  source).  -Left MCA occlusion in the M1 segment.  9/28 echocardiogram with contrast Left ventricle: mild LVH. -LVEF= 60% to 65%. -(grade 1 diastolic dysfunction). - Right ventricle: mildly dilated. - Right atrium: mildly dilated. - Pulmonary arteries: PA peak pressure: 52 mm Hg (S).  Culture 9/29 blood pending 9/29 sputum pending  Antibiotics: Clindamycin 9/27 >  DVT prophylaxis: SCD  Objective: Blood pressure 136/53, pulse 77, temperature 100.2 F (37.9 C), temperature source Oral, resp. rate 14, height 5\' 5"  (1.651 m), weight 107.8 kg (237 lb 10.5 oz), SpO2 98.00%.  Intake/Output Summary (Last 24 hours) at 09/21/14 1348 Last data filed at 09/21/14 0534  Gross per 24 hour  Intake     50 ml  Output   2525 ml  Net  -2475 ml   Exam: General: No acute respiratory distress  Lungs: bibasilar crackles - no wheeze  Cardiovascular: Regular rate and rhythm without murmur gallop or rub normal S1 and S2 Abdomen: Nontender, nondistended, soft, bowel sounds positive, no rebound, no ascites, no appreciable mass Extremities: No significant cyanosis, clubbing, or edema bilateral lower extremities  Data Reviewed: Basic Metabolic Panel:  Recent Labs Lab 09/18/14 1558 09/19/14 1004 09/20/14 1525 09/20/14 2155 09/21/14 0347  NA 137 140 141  --  143  K 4.0 4.1 3.6*  --  4.2  CL 100 103 103  --  105  CO2 24 25 24   --  23  GLUCOSE 158* 117* 94  --  120*  BUN 26* 26* 25*  --  29*  CREATININE 1.44* 1.54* 1.59*  --  1.71*  CALCIUM 9.1 9.0 8.8  --  9.0  MG  --   --  2.2 2.2 2.3   Liver Function Tests:  Recent Labs Lab 09/18/14 1558 09/20/14 1525 09/21/14 0347  AST 13 28 51*  ALT 20 29 60*  ALKPHOS 39 41 44  BILITOT 0.5 0.7 0.8  PROT 6.8 6.2 6.7  ALBUMIN 2.8* 2.3* 2.4*   CBC:  Recent Labs Lab 09/18/14 1558 09/19/14 1004 09/20/14 1525 09/21/14 0347  WBC 16.6* 12.6* 10.7* 11.1*  NEUTROABS 14.0*  --  8.1* 8.8*  HGB 11.5* 10.6* 10.0* 10.9*  HCT 31.6* 29.4* 28.7* 31.1*   MCV 86.6 86.5 88.3 88.6  PLT 42* 30* 39* 54*   Cardiac Enzymes:  Recent Labs Lab 09/19/14 0455 09/19/14 1004 09/20/14 2155 09/21/14 0347 09/21/14 1142  TROPONINI 0.33* 0.32* <0.30 <0.30 <0.30   CBG:  Recent Labs Lab 09/20/14 1132 09/20/14 1802 09/21/14 0038 09/21/14 0530 09/21/14 1158  GLUCAP 97 82 118* 130* 128*    Recent Results (from the past 240 hour(s))  MRSA PCR SCREENING     Status: None   Collection Time    09/18/14  6:13 PM      Result Value Ref Range Status   MRSA by PCR NEGATIVE  NEGATIVE Final   Comment:            The GeneXpert MRSA Assay (FDA     approved for NASAL specimens     only), is one component of a     comprehensive MRSA colonization     surveillance program. It is not     intended to diagnose MRSA     infection nor to guide or  monitor treatment for     MRSA infections.  CULTURE, RESPIRATORY (NON-EXPECTORATED)     Status: None   Collection Time    09/20/14  8:42 PM      Result Value Ref Range Status   Specimen Description TRACHEAL ASPIRATE   Final   Special Requests Normal   Final   Gram Stain     Final   Value: FEW WBC PRESENT,BOTH PMN AND MONONUCLEAR     FEW SQUAMOUS EPITHELIAL CELLS PRESENT     MODERATE GRAM NEGATIVE RODS     FEW GRAM POSITIVE COCCI     IN PAIRS   Culture PENDING   Incomplete   Report Status PENDING   Incomplete     Studies:  Recent x-ray studies have been reviewed in detail by the Attending Physician  Scheduled Meds:  Scheduled Meds: . aspirin  300 mg Rectal Daily   Or  . aspirin  325 mg Oral Daily  . clindamycin (CLEOCIN) IV  600 mg Intravenous 3 times per day  . fluticasone  2 spray Each Nare Daily  . hydrocortisone sod succinate (SOLU-CORTEF) inj  100 mg Intravenous 3 times per day  . ipratropium-albuterol  3 mL Nebulization QID  . levothyroxine  37.5 mcg Intravenous Daily  . pantoprazole (PROTONIX) IV  40 mg Intravenous Q12H    Time spent on care of this patient: 35 mins  Lonia Blood, MD [facility] For Consults/Admissions - Flow Manager - [phone] Office  [phone] Pager [phone]  On-Call/Text Page:      [email or web address]      password [email or web address]  09/21/2014, 1:48 PM   LOS: 3 days

## 2014-09-21 NOTE — Progress Notes (Signed)
Speech Language Pathology Treatment: Dysphagia  Patient Details Name: Melissa [name] MRN: [id number] DOB: 12-14-1946 Today's Date: 09/21/2014 Time: 9470-7615 SLP Time Calculation (min): 10 min  Assessment / Plan / Recommendation Clinical Impression  F/u after yesterday's swallow assessment.  Pt with eyes open today, but not following commands, with poor bolus attention/recognition with limited PO trials.  Pt unable to manipulate bolus; no spontaneous swallow triggered. Total assist provided with verbal/tactile cues to maximize success, achieve lip seal and activate swallow, to no avail. Recommend continuing NPO status - may need to consider short-term enteral feeding if no spontaneous improvement next 24-48 hours.  SLP to follow for readiness.    HPI HPI: 68 y.o. admitted with AMS and found down. PMH: obesity, hypothyroidism, hypertension, hyperlipidemia, history of kidney cancer status post right nephrectomy, pulmonary fibrosis, multiple myeloma currently undergoing chemotherapy. Found to have right sided weakness and facial droop and was not verbalizing.  MRI large acute left MCA infarct and numerous scattered small acute infarcts in the bilateral MCA, right PCA, and bilateral PICA territories.  CXR patchy airspace disease, particularly in the left lung. Findings are concerning for pneumonia or aspiration.   Pertinent Vitals Pain Assessment: Faces Faces Pain Scale: No hurt  SLP Plan  Continue with current plan of care    Recommendations Diet recommendations: NPO              Oral Care Recommendations:  (QID) Follow up Recommendations:  (tba) Plan: Continue with current plan of care    Melissa [name] L. Tivis Ringer, [place] CCC/SLP Pager [phone]      Melissa [name] 09/21/2014, 10:10 AM

## 2014-09-21 NOTE — Evaluation (Signed)
Speech Language Pathology Evaluation Patient Details Name: Melissa [name] MRN: [id number] DOB: [birth year] Today's Date: 09/21/2014 Time: 6578-4696 SLP Time Calculation (min): 10 min  Problem List:  Patient Active Problem List   Diagnosis Date Noted  . CKD (chronic kidney disease) stage 3, GFR 30-59 ml/min 09/19/2014  . CVA (cerebral infarction) 09/18/2014  . Aspiration pneumonia 09/18/2014  . Thrombocytopenia, unspecified 09/18/2014  . Elevated troponin 09/18/2014  . Multiple myeloma without remission 08/29/2014  . Cystitis 05/08/2014  . 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 Radicular low back pain 04/18/2014  . Difficulty walking 04/18/2014  . Bilateral leg weakness 04/18/2014  . Positive ANA (antinuclear antibody) 03/22/2014  . Knee pain, right 03/22/2014  . Left groin pain 11/01/2013  . Lung nodules 08/02/2013  . Pulmonary fibrosis 03/29/2013  . Routine general medical examination at a health care facility 01/23/2013  . Anemia 08/13/2012  . Dyspnea on exertion 04/14/2012  . Hyperlipidemia   . Hypertension   . History of kidney cancer   . Chest pain   . Vitiligo 04/01/2011  . HEARING LOSS 07/18/2010  . Depression 05/18/2010  . VITAMIN D DEFICIENCY 10/24/2009  . Hypothyroid 03/31/2008  . Obesity 03/31/2008  . VERTIGO, INTERMITTENT 03/31/2008   Past Medical History:  Past Medical History  Diagnosis Date  . Vertigo, intermittent   . Hypothyroidism   . Hyperlipidemia   . Obesity   . Hypertension     severe and resistant to treatment; 2008-negative left renal angiogram  . Vitiligo   . Degenerative disc disease, lumbar   . Degenerative disc disease, cervical   . Chest pain 2008    2008-normal coronary angiography  . Renal cell carcinoma     Right nephrectomy   Past Surgical History:  Past Surgical History  Procedure Laterality Date  . Tubal ligation  1983    Bilateral  . Nephrectomy      Right; secondary to renal cell carcinoma  . Colonoscopy N/A  04/21/2014    Procedure: COLONOSCOPY;  Surgeon: Malissa Hippo, MD;  Location: [facility];  Service: Endoscopy;  Laterality: N/A;  . Colonoscopy N/A 04/21/2014    Procedure: COLONOSCOPY;  Surgeon: Malissa Hippo, MD;  Location: [facility];  Service: Endoscopy;  Laterality: N/A;  1030   HPI:  68 y.o. admitted with AMS and found down. PMH: obesity, hypothyroidism, hypertension, hyperlipidemia, history of kidney cancer status post right nephrectomy, pulmonary fibrosis, multiple myeloma currently undergoing chemotherapy. Found to have right sided weakness and facial droop and was not verbalizing.  MRI large acute left MCA infarct and numerous scattered small acute infarcts in the bilateral MCA, right PCA, and bilateral PICA territories.  CXR patchy airspace disease, particularly in the left lung. Findings are concerning for pneumonia or aspiration.   Assessment / Plan / Recommendation Clinical Impression  Pt presents with a global aphasia with inattention, no spontaneous verbalizations nor vegetative voicing.  Tracking family left side of room.  Pt demonstrates emerging ability to follow simple, predictable one-step commands with total visual/verbal/tactile cues.   Sister present; we discussed aphasia and its impact on communication.  SLP will follow acutely.    SLP Assessment  Patient needs continued Speech Lanaguage Pathology Services       Frequency and Duration min 3x week  2 weeks   Pertinent Vitals/Pain Pain Assessment: Faces Faces Pain Scale: No hurt   SLP Goals  Potential to Achieve Goals: Fair Potential Considerations: Severity of impairments  SLP Evaluation Prior Functioning  Cognitive/Linguistic Baseline: Within  functional limits Type of Home: House  Lives With: Alone Education: [employer] degree Vocation: Retired   IT consultant  Overall Cognitive Status: Impaired/Different from baseline Arousal/Alertness: Lethargic Orientation Level: Other (comment) (Unable to assess pt. is  nonverbal) Attention: Focused Focused Attention: Impaired    Comprehension  Auditory Comprehension Overall Auditory Comprehension: Impaired Yes/No Questions: Impaired Basic Biographical Questions: 0-25% accurate Commands: Impaired One Step Basic Commands: 0-24% accurate Visual Recognition/Discrimination Discrimination: Exceptions to [facility] Common Objects: Unable to indentify Reading Comprehension Reading Status: Not tested    Expression Expression Primary Mode of Expression: Verbal Verbal Expression Overall Verbal Expression: Impaired Repetition: Impaired Level of Impairment: Word level Naming: Impairment Responsive: 0-25% accurate Confrontation: Impaired Common Objects: Unable to indentify Convergent: 0-24% accurate Pragmatics: Impairment Written Expression Dominant Hand: Right Written Expression: Not tested   Oral / Motor Oral Motor/Sensory Function Overall Oral Motor/Sensory Function: Impaired (right CN VII deficit) Motor Speech Overall Motor Speech: Impaired Phonation: Aphonic Articulation: Impaired Level of Impairment: Word   Melissa [name] L. Melissa [name], Melissa [name] Melissa [name] Melissa [name]      Melissa [name] 09/21/2014, 10:05 AM

## 2014-09-21 NOTE — Evaluation (Signed)
Physical Therapy Evaluation Patient Details Name: Melissa [name] MRN: [id number] DOB: July 14, 1946 Today's Date: 09/21/2014   History of Present Illness  Pt is a 68 y.o female presenting s/p L MCA ischemic CVA with R sided weakness, R facial droop, and not verbalizing. TPA was not administered. Hx of  intermittent vertigo, hypothyroidism, hyperlipidemia, obesity, HTN, R nephrectomy am renal cell carcinoma. Pt receiving chemotherapy treatment for multiple myeloma.  Clinical Impression  Pt currently requires max A +2 physical assistance with multimodal cueing for bed mobility. Pt limited ability to follow commands and demonstrating no active movement on R side with R side neglect. Discussed d/c plan with pt's sister, in agreement that rehab is essential before d/c home. Based on pt's current status, recommending SNF. Pt will benefit from acute skilled PT services in order to improve current functional status and further assess mobility.       Follow Up Recommendations SNF    Equipment Recommendations  Other (comment) (tbd)    Recommendations for Other Services       Precautions / Restrictions        Mobility  Bed Mobility Overal bed mobility: Needs Assistance;+2 for physical assistance Bed Mobility: Rolling;Sidelying to Sit;Sit to Sidelying Rolling: +2 for physical assistance;Max assist Sidelying to sit: +2 for physical assistance;Max assist     Sit to sidelying: +2 for physical assistance;Max assist General bed mobility comments: Rolling: pt able to use L UE to grab rail after tactile guiding and verbal cues. Use of pad to assist in rolling trunk to R. Sidelying to sit: pt require assist with trunk using pad and B LE. pt able to assist in maintaining upright in sitting. Sit to Supine: pt able to lift L LE up to bed, requires total A for R LE. Truncal assist to slow descend.  Pt requires total assist for scooting up in bed.   Transfers                    Ambulation/Gait                 Stairs            Wheelchair Mobility    Modified Rankin (Stroke Patients Only) Modified Rankin (Stroke Patients Only) Pre-Morbid Rankin Score: No symptoms Modified Rankin: Severe disability     Balance Overall balance assessment: Needs assistance Sitting-balance support: Single extremity supported;Feet supported Sitting balance-Leahy Scale: Poor Sitting balance - Comments: Pt requires assistance to maintain sitting for >20 seconds. Pt holding on to bedrail on L UE to prevent trunk collapse to R.  Without L UE support, trunk collapse to R.                                      Pertinent Vitals/Pain Pain Assessment: Faces Faces Pain Scale: No hurt    Home Living Family/patient expects to be discharged to:: Skilled nursing facility Living Arrangements: Alone   Type of Home: House       Home Layout: Two level Home Equipment: None      Prior Function Level of Independence: Independent               Hand Dominance        Extremity/Trunk Assessment   Upper Extremity Assessment: RUE deficits/detail;Difficult to assess due to impaired cognition RUE Deficits / Details: no active movement of R UE.  Lower Extremity Assessment: RLE deficits/detail;Difficult to assess due to impaired cognition;Generalized weakness (L LE WFL for tasks assessed) RLE Deficits / Details: no active movement of R LE. difficult to fully assess 2/2 impaired communication       Communication   Communication: Receptive difficulties;Expressive difficulties  Cognition Arousal/Alertness: Lethargic Behavior During Therapy: Flat affect Overall Cognitive Status: Impaired/Different from baseline Area of Impairment: Attention;Following commands;Awareness       Following Commands: Follows one step commands inconsistently;Follows one step commands with increased time       General Comments: difficult to assess 2/2 to communication. Pt  occassionally able to follow commands, but inconsistent.     General Comments General comments (skin integrity, edema, etc.): Pt with no active movement on R and neglect of R side. Pt gaze sustained to L, encourgaed family to sit on R to see if any changes in attention to R.  Pt able to actiely initiate movement with L UE and L LE and respond to tactile stimulus on L. Mild improvement in response to cues when standing on L side.  Lots of cueing and redirection of attention required throughout session. Discussed d/c plan and prognosis with pt's sister who was present for entire treatment.    Exercises        Assessment/Plan    PT Assessment Patient needs continued PT services  PT Diagnosis Difficulty walking;Abnormality of gait;Generalized weakness;Hemiplegia dominant side   PT Problem List Decreased strength;Decreased range of motion;Decreased activity tolerance;Decreased balance;Decreased mobility;Decreased coordination;Decreased cognition;Decreased knowledge of use of DME;Decreased safety awareness;Decreased knowledge of precautions  PT Treatment Interventions DME instruction;Gait training;Functional mobility training;Therapeutic activities;Therapeutic exercise;Balance training;Neuromuscular re-education;Cognitive remediation;Patient/family education   PT Goals (Current goals can be found in the Care Plan section) Acute Rehab PT Goals Patient Stated Goal: unable  PT Goal Formulation: With family Time For Goal Achievement: 10/05/14 Potential to Achieve Goals: Fair    Frequency Min 3X/week   Barriers to discharge        Co-evaluation               End of Session Equipment Utilized During Treatment: Gait belt Activity Tolerance: Patient limited by lethargy Patient left: in bed;with call bell/phone within reach;with family/visitor present Nurse Communication: Mobility status         Time: 1001-1026 PT Time Calculation (min): 25 min   Charges:         PT G Codes:           Louann Hopson 09/21/2014, 2:22 PM Cathlyn Parsons, SPT

## 2014-09-22 DIAGNOSIS — C9000 Multiple myeloma not having achieved remission: Secondary | ICD-10-CM

## 2014-09-22 DIAGNOSIS — J690 Pneumonitis due to inhalation of food and vomit: Secondary | ICD-10-CM

## 2014-09-22 DIAGNOSIS — Z85528 Personal history of other malignant neoplasm of kidney: Secondary | ICD-10-CM

## 2014-09-22 DIAGNOSIS — I63512 Cerebral infarction due to unspecified occlusion or stenosis of left middle cerebral artery: Secondary | ICD-10-CM

## 2014-09-22 DIAGNOSIS — E785 Hyperlipidemia, unspecified: Secondary | ICD-10-CM

## 2014-09-22 DIAGNOSIS — I10 Essential (primary) hypertension: Secondary | ICD-10-CM

## 2014-09-22 DIAGNOSIS — I639 Cerebral infarction, unspecified: Secondary | ICD-10-CM

## 2014-09-22 DIAGNOSIS — R7989 Other specified abnormal findings of blood chemistry: Secondary | ICD-10-CM

## 2014-09-22 HISTORY — PX: PEG TUBE PLACEMENT: SUR1034

## 2014-09-22 LAB — COMPREHENSIVE METABOLIC PANEL
ALBUMIN: 2.5 g/dL — AB (ref 3.5–5.2)
ALK PHOS: 47 U/L (ref 39–117)
ALT: 62 U/L — ABNORMAL HIGH (ref 0–35)
AST: 25 U/L (ref 0–37)
Anion gap: 15 (ref 5–15)
BILIRUBIN TOTAL: 0.7 mg/dL (ref 0.3–1.2)
BUN: 39 mg/dL — ABNORMAL HIGH (ref 6–23)
CHLORIDE: 107 meq/L (ref 96–112)
CO2: 24 mEq/L (ref 19–32)
CREATININE: 1.76 mg/dL — AB (ref 0.50–1.10)
Calcium: 9.5 mg/dL (ref 8.4–10.5)
GFR calc Af Amer: 33 mL/min — ABNORMAL LOW (ref >90)
GFR calc non Af Amer: 29 mL/min — ABNORMAL LOW (ref >90)
Glucose, Bld: 162 mg/dL — ABNORMAL HIGH (ref 70–99)
POTASSIUM: 3.9 meq/L (ref 3.7–5.3)
Sodium: 146 mEq/L (ref 137–147)
Total Protein: 6.9 g/dL (ref 6.0–8.3)

## 2014-09-22 LAB — CBC
HCT: 30 % — ABNORMAL LOW (ref 36.0–46.0)
HEMOGLOBIN: 10.6 g/dL — AB (ref 12.0–15.0)
MCH: 31.4 pg (ref 26.0–34.0)
MCHC: 35.3 g/dL (ref 30.0–36.0)
MCV: 88.8 fL (ref 78.0–100.0)
Platelets: 100 10*3/uL — ABNORMAL LOW (ref 150–400)
RBC: 3.38 MIL/uL — ABNORMAL LOW (ref 3.87–5.11)
RDW: 16.2 % — AB (ref 11.5–15.5)
WBC: 9.6 10*3/uL (ref 4.0–10.5)

## 2014-09-22 LAB — GLUCOSE, CAPILLARY
GLUCOSE-CAPILLARY: 154 mg/dL — AB (ref 70–99)
GLUCOSE-CAPILLARY: 184 mg/dL — AB (ref 70–99)
Glucose-Capillary: 166 mg/dL — ABNORMAL HIGH (ref 70–99)
Glucose-Capillary: 166 mg/dL — ABNORMAL HIGH (ref 70–99)
Glucose-Capillary: 176 mg/dL — ABNORMAL HIGH (ref 70–99)

## 2014-09-22 MED ORDER — SODIUM CHLORIDE 0.9 % IV SOLN
INTRAVENOUS | Status: DC
  Administered 2014-09-22 – 2014-09-23 (×2): via INTRAVENOUS

## 2014-09-22 NOTE — Clinical Documentation Improvement (Signed)
Diastolic CHF documented in current record; Lasix dosing this admission increased from 20 mg IV BID to 40 mg IV BID.   Please provide acuity of the diastolic CHF.    Acuity: -Acute diastolic CHF -Chronic diastolic CHF -Acute on chronic CHF -Other -Unable to determine at present  Thank you, Mateo Flow, RN [phone] Clinical Documentation Specialist

## 2014-09-22 NOTE — Progress Notes (Signed)
Pt. To be transferred to 4N in stable condition.  Pt. Family notified belongings to be sent with patient.  Report given to receiving RN.

## 2014-09-22 NOTE — Progress Notes (Signed)
Speech Language Pathology Treatment: Dysphagia  Patient Details Name: Melissa [name] MRN: [id number] DOB: 1946/06/07 Today's Date: 09/22/2014 Time: 1031-2811 SLP Time Calculation (min): 25 min  Assessment / Plan / Recommendation Clinical Impression  Pt with marked improvement since yesterday, however continues not to be appropriate for po due to level of dysphagia.  Pt administered ice chips, small amount of applesauce *1/4 tsp, and water via toothette.  Delayed oral transiting, oral holding despite SLP verbal, visual, tactile cues to swallow.  SLP orally suctioned pt to remove portion of applesauce in lateral sulci.  Today pt is alert and attempts at phonation noted.  She is also eliciting pharyngeal swallow reflexively- though inconsistent.    Daughter Melissa [name] present and recommendations reviewed with her using teach back, return demonstration to assure her mother swallows when using water via toothette.  Hopeful for pt to be able to consume po diet soon given rapid improvement compared to yesterday.  RN, pt and family educated to recommendation for use of toothette and water in SMALL amounts.      SLP to follow up next date for dysphagia/aphasia treatment.     HPI HPI: 68 y.o. admitted with AMS and found down. PMH: obesity, hypothyroidism, hypertension, hyperlipidemia, history of kidney cancer status post right nephrectomy, pulmonary fibrosis, multiple myeloma currently undergoing chemotherapy. Found to have right sided weakness and facial droop and was not verbalizing.  MRI large acute left MCA infarct and numerous scattered small acute infarcts in the bilateral MCA, right PCA, and bilateral PICA territories.  CXR patchy airspace disease, particularly in the left lung. Findings are concerning for pneumonia or aspiration.   Pertinent Vitals Pain Assessment: Faces Faces Pain Scale: No hurt  SLP Plan  Continue with current plan of care    Recommendations Diet recommendations: NPO (water via  toothettes for oral hygeine and to elicit swallowing)              Follow up Recommendations:  (tbd) Plan: Continue with current plan of care    [place], [facility] SLP [phone]

## 2014-09-22 NOTE — Progress Notes (Signed)
VASCULAR LAB PRELIMINARY  PRELIMINARY  PRELIMINARY  PRELIMINARY  Bilateral venous Dopplers completed.    Preliminary report:  There is no DVT or SVT noted in the bilateral lower extremities.   Lanecia Sliva, RVT 09/22/2014, 12:18 PM

## 2014-09-22 NOTE — Progress Notes (Signed)
Patient ID: Melissa [name]  female  KYH:[id number]    DOB: [birth year]    DOA: 09/18/2014  PCP: [name], MD  Admit HPI / Brief Narrative:  68 y.o F Hx HTN, hyperlipidemia, renal cell carcinoma s/p right nephrectomy, pulmonary fibrosis, multiple myeloma on chemotherapy (Revlimid 25 mg days 1-14 every 21 days / Velcade D1, 4, 8, and 11 every 21 days / dexamethasone) last seen by Dr. Alla German (Oncology) on 9/21, who was transferred to [facility] for further stroke management. Patient has altered mental status and is aphasic.  She was initially evaluated at [facility] where notes indicated that she was brought to the hospital by EMS due to altered mental status. The patient was found by her daughter at approximately 2 PM. She had fallen on the ground at an unknown time and was found on the floor by her daughter. She was altered at that point and had weakness of her right side including right facial droop and was not verbalizing. She was last known to be well the previous night. Code stroke was called and Neurology determined not to proceed with TPA due to unknown time of onset.      Assessment/Plan: Principal Problem: Large Acute L MCA stroke and numerous scattered small acute infarcts in the bilateral MCA, right PCA, and bilateral PICA territories with right hemiparesis - Findings consistent with cardiac/proximal aortic emboli vs coagulopathy from chemotherapy; no findings on TTE - Cardiology consulted for TEE - contact Dr. Alla German (Oncology) prior to d/c as Revlimid known to significantly increase risk of thromboembolism (to include CVA and even MI) - regardless, clearly not a current candidate for resumption of this drug   Active Problems:   Aspiration pneumonia - Continue clindamycin, Solu-Medrol, nebs, n.p.o. status  Dysphagia: Residual dysphagia from CVA - Currently n.p.o. status, speech therapy following closely, if no meaningful improvement, discussed in detail with  patient's daughter at the bedside regarding PANDA tube or pursue PEG tube  Diastolic CHF  -Due to permissive hypertension, hold off on Coreg and Imdur for now, n.p.o. status   Progressive renal failure  -hydrate and follow   Mildly Elevated Troponin  -Likely due to massive stroke. No EKG changes. Have since normalized . 2-D echo showed EF of 60-65%, no regional wall motion abnormalities.  Thrombocythemia  -Likley due to chemotherapy, improving l  Multiple myeloma  -Patient just ended one cycle of treatment and is on a planned 7 day rest - doubt her candidacy to return to active tx, unless dramatic improvement in clinical status occurs   Hypertension  -Hold antihypertensives to maintain cranial perfusion - BP improved today   Hypothyroid  Convert to IV. As the patient is treated with 100 mcg and 50 mcg alternating daily, will treat the patient with 37.5 micrograms IV daily.   DVT Prophylaxis:SCD's  Code Status: Full code  Family Communication: Discussed in detail with patient's daughter at the bedside  Disposition: Transfer to neuro floor  Consultants:  Neurology  Cardiology for TEE  Procedures: 9/27 CT head without contrast Large LEFT MCA territory infarct involving LEFT temporal and  parietal lobes.  -Old small high RIGHT frontal & questionable LEFT cerebellar infarcts.  9/27 PCXR Patchy airspace disease,>left lung. C/W pneumonia vs aspiration vs Asymmetric pulmonary edema  9/28 MRI/MRA head without contrast Large acute left MCA infarct. Cytotoxic edema and mass effect but no associated hemorrhage at this time.  -Numerous scattered small acute infarcts in the bilateral MCA/right PCA/bilateral PICA territories. most  C/W embolic  event (cardiac or proximal aortic source).  -Left MCA occlusion in the M1 segment.  9/28 echocardiogram with contrast Left ventricle: mild LVH. -LVEF= 60% to 65%. -(grade 1 diastolic dysfunction). - Right ventricle: mildly dilated. - Right  atrium: mildly dilated.  - Pulmonary arteries: PA peak pressure: 52 mm Hg (S).      Antibiotics:  Clindamycin 9/27>    Subjective: Patient seen and examined, tracks with her eyes otherwise aphasia, daughter at bedside  Objective: Weight change:   Intake/Output Summary (Last 24 hours) at 09/22/14 1258 Last data filed at 09/22/14 1000  Gross per 24 hour  Intake   1100 ml  Output    611 ml  Net    489 ml   Blood pressure 158/69, pulse 68, temperature 97.6 F (36.4 C), temperature source Axillary, resp. rate 19, height 5\' 5"  (1.651 m), weight 107.8 kg (237 lb 10.5 oz), SpO2 99.00%.  Physical Exam: General: Alert and awake, NAD, tracks with her eyes, aphasia CVS: S1-S2 clear, no murmur rubs or gallops Chest: clear to auscultation bilaterally, no wheezing, rales or rhonchi Abdomen: soft nontender, nondistended, normal bowel sounds  Extremities: no cyanosis, clubbing or edema noted bilaterally Neuro: Right-sided hemiparesis, left UE and LLE 4+/5  Lab Results: Basic Metabolic Panel:  Recent Labs Lab 09/21/14 0347 09/22/14 0348  NA 143 146  K 4.2 3.9  CL 105 107  CO2 23 24  GLUCOSE 120* 162*  BUN 29* 39*  CREATININE 1.71* 1.76*  CALCIUM 9.0 9.5  MG 2.3  --    Liver Function Tests:  Recent Labs Lab 09/21/14 0347 09/22/14 0348  AST 51* 25  ALT 60* 62*  ALKPHOS 44 47  BILITOT 0.8 0.7  PROT 6.7 6.9  ALBUMIN 2.4* 2.5*   No results found for this basename: LIPASE, AMYLASE,  in the last 168 hours No results found for this basename: AMMONIA,  in the last 168 hours CBC:  Recent Labs Lab 09/21/14 0347 09/22/14 0348  WBC 11.1* 9.6  NEUTROABS 8.8*  --   HGB 10.9* 10.6*  HCT 31.1* 30.0*  MCV 88.6 88.8  PLT 54* 100*   Cardiac Enzymes:  Recent Labs Lab 09/20/14 2155 09/21/14 0347 09/21/14 1142  TROPONINI <0.30 <0.30 <0.30   BNP: No components found with this basename: POCBNP,  CBG:  Recent Labs Lab 09/21/14 1158 09/21/14 1807 09/21/14 2358  09/22/14 0730 09/22/14 1224  GLUCAP 128* 148* 166* 154* 166*     Micro Results: Recent Results (from the past 240 hour(s))  MRSA PCR SCREENING     Status: None   Collection Time    09/18/14  6:13 PM      Result Value Ref Range Status   MRSA by PCR NEGATIVE  NEGATIVE Final   Comment:            The GeneXpert MRSA Assay (FDA     approved for NASAL specimens     only), is one component of a     comprehensive MRSA colonization     surveillance program. It is not     intended to diagnose MRSA     infection nor to guide or     monitor treatment for     MRSA infections.  CULTURE, RESPIRATORY (NON-EXPECTORATED)     Status: None   Collection Time    09/20/14  8:42 PM      Result Value Ref Range Status   Specimen Description TRACHEAL ASPIRATE   Final   Special Requests Normal  Final   Gram Stain     Final   Value: FEW WBC PRESENT,BOTH PMN AND MONONUCLEAR     FEW SQUAMOUS EPITHELIAL CELLS PRESENT     MODERATE GRAM NEGATIVE RODS     FEW GRAM POSITIVE COCCI     IN PAIRS   Culture     Final   Value: Non-Pathogenic Oropharyngeal-type Flora Isolated.     Performed at [facility]   Report Status PENDING   Incomplete  CULTURE, BLOOD (ROUTINE X 2)     Status: None   Collection Time    09/20/14  9:55 PM      Result Value Ref Range Status   Specimen Description BLOOD RIGHT ARM   Final   Special Requests     Final   Value: BOTTLES DRAWN AEROBIC AND ANAEROBIC 10CC AEROBIC 5CC ANAEROBIC   Culture  Setup Time     Final   Value: 09/21/2014 03:28     Performed at [facility]   Culture     Final   Value:        BLOOD CULTURE RECEIVED NO GROWTH TO DATE CULTURE WILL BE HELD FOR 5 DAYS BEFORE ISSUING A FINAL NEGATIVE REPORT     Performed at [facility]   Report Status PENDING   Incomplete  CULTURE, BLOOD (ROUTINE X 2)     Status: None   Collection Time    09/20/14 10:05 PM      Result Value Ref Range Status   Specimen Description BLOOD RIGHT HAND   Final    Special Requests BOTTLES DRAWN AEROBIC ONLY 10CC   Final   Culture  Setup Time     Final   Value: 09/21/2014 03:29     Performed at [facility]   Culture     Final   Value:        BLOOD CULTURE RECEIVED NO GROWTH TO DATE CULTURE WILL BE HELD FOR 5 DAYS BEFORE ISSUING A FINAL NEGATIVE REPORT     Performed at [facility]   Report Status PENDING   Incomplete    Studies/Results: Ct Abdomen Pelvis Wo Contrast  09/21/2014   CLINICAL DATA:  Evaluate for septic emboli. History of renal cell cancer, multiple myeloma.  EXAM: CT CHEST, ABDOMEN AND PELVIS WITHOUT CONTRAST  TECHNIQUE: Multidetector CT imaging of the chest, abdomen and pelvis was performed following the standard protocol without IV contrast.  COMPARISON:  CT 05/11/2014 and 04/20/2014.  FINDINGS: CT CHEST FINDINGS  Thoracic aorta normal caliber. Atherosclerotic vascular changes noted stable cardiomegaly. Coronary artery disease.  Shotty mediastinal lymph nodes.  Thoracic esophagus is unremarkable.  Endobronchial lesion is noted in the right mainstem bronchus, most likely mucous plugging. Similar findings noted in the trachea to a lesser degree. New also severe bilateral pulmonary infiltrates most consistent with pneumonia. Pulmonary edema could present this fashion. Tiny pleural effusions. No pneumothorax.  Calcified right thyroid lobe mass again noted. Chest wall is intact.  CT ABDOMEN AND PELVIS FINDINGS  Liver normal. Spleen normal. Pancreas normal. No biliary distention. The gallbladder is nondistended.  Right nephrectomy. Right surgical bed is unremarkable. No mass lesion. Left kidney is unremarkable. No hydronephrosis or obstructing ureteral stone. Foley catheter is noted in the bladder. Air is noted bladder pelvis is most likely from instrumentation. Uterus and adnexa are unremarkable.  No significant adenopathy. Aortoiliac atherosclerotic vascular disease.  Appendiceal region is normal. There is no bowel distention. No free  air. No mesenteric mass. Mild subcutaneous  edema noted over the lower flanks. Developing mild anasarca could present in this fashion. Correlation to exclude cellulitis or developing hematoma suggested.  Diffuse severe thoracolumbar spine degenerative change. Sclerotic changes about the SI joints are stable.  IMPRESSION: 1. New severe bilateral pulmonary infiltrates most consistent with pneumonia. Pulmonary edema could present in this fashion. 2. Endotracheal and right endobronchial soft tissue densities, most likely mucous plugging. Possibility of a and bronchial primary or metastatic lesion cannot be excluded. 3. Right nephrectomy. Nephrectomy bed appears stable. No recurrent mass. 4. Mild subcutaneous edema noted over the lower flanks. Developing obstructive could present in this fashion. Clinical correlation is is suggested to exclude developing cellulitis or developing hematomas in these regions.   Electronically Signed   By: Maisie Fus  Register   On: 09/20/2014 21:55   Ct Head Wo Contrast  09/18/2014   CLINICAL DATA:  Code stroke, altered level of consciousness  EXAM: CT HEAD WITHOUT CONTRAST  TECHNIQUE: Contiguous axial images were obtained from the base of the skull through the vertex without intravenous contrast.  COMPARISON:  None  FINDINGS: Cavum septum pellucidum and vergae.  Otherwise normal ventricular morphology.  No midline shift.  Old high RIGHT frontal infarct.  Large acute LEFT MCA territory infarct involving the LEFT temporal and parietal lobes with effacement of sulci.  No definite dense middle cerebral artery sign.  No intracranial hemorrhage, mass lesion or extra-axial fluid collections.  Low-attenuation at posterior aspect of LEFT cerebellar hemisphere may represent a small old infarct as well.  Minimal small vessel chronic ischemic changes of deep cerebral white matter.  No acute bone or sinus findings.  Bony excrescences are identified at the sphenoid wings bilaterally potentially  developmental variants or exostoses though difficult to completely exclude calcified meningiomas, considered much less likely.  IMPRESSION: Large LEFT MCA territory infarct involving LEFT temporal and parietal lobes.  No acute intracranial hemorrhage or midline shift.  Old small high RIGHT frontal and questionable LEFT cerebellar infarcts.  Critical Value/emergent results were called by telephone at the time of interpretation on 09/18/2014 at 1556 hr to Dr. Donnetta Hutching, who verbally acknowledged these results.   Electronically Signed   By: Ulyses Southward M.D.   On: 09/18/2014 15:58   Ct Chest Wo Contrast  09/20/2014   CLINICAL DATA:  Evaluate for septic emboli. History of renal cell cancer, multiple myeloma.  EXAM: CT CHEST, ABDOMEN AND PELVIS WITHOUT CONTRAST  TECHNIQUE: Multidetector CT imaging of the chest, abdomen and pelvis was performed following the standard protocol without IV contrast.  COMPARISON:  CT 05/11/2014 and 04/20/2014.  FINDINGS: CT CHEST FINDINGS  Thoracic aorta normal caliber. Atherosclerotic vascular changes noted stable cardiomegaly. Coronary artery disease.  Shotty mediastinal lymph nodes.  Thoracic esophagus is unremarkable.  Endobronchial lesion is noted in the right mainstem bronchus, most likely mucous plugging. Similar findings noted in the trachea to a lesser degree. New also severe bilateral pulmonary infiltrates most consistent with pneumonia. Pulmonary edema could present this fashion. Tiny pleural effusions. No pneumothorax.  Calcified right thyroid lobe mass again noted. Chest wall is intact.  CT ABDOMEN AND PELVIS FINDINGS  Liver normal. Spleen normal. Pancreas normal. No biliary distention. The gallbladder is nondistended.  Right nephrectomy. Right surgical bed is unremarkable. No mass lesion. Left kidney is unremarkable. No hydronephrosis or obstructing ureteral stone. Foley catheter is noted in the bladder. Air is noted bladder pelvis is most likely from instrumentation. Uterus  and adnexa are unremarkable.  No significant adenopathy. Aortoiliac atherosclerotic vascular disease.  Appendiceal  region is normal. There is no bowel distention. No free air. No mesenteric mass. Mild subcutaneous edema noted over the lower flanks. Developing mild anasarca could present in this fashion. Correlation to exclude cellulitis or developing hematoma suggested.  Diffuse severe thoracolumbar spine degenerative change. Sclerotic changes about the SI joints are stable.  IMPRESSION: 1. New severe bilateral pulmonary infiltrates most consistent with pneumonia. Pulmonary edema could present in this fashion. 2. Endotracheal and right endobronchial soft tissue densities, most likely mucous plugging. Possibility of a and bronchial primary or metastatic lesion cannot be excluded. 3. Right nephrectomy. Nephrectomy bed appears stable. No recurrent mass. 4. Mild subcutaneous edema noted over the lower flanks. Developing obstructive could present in this fashion. Clinical correlation is is suggested to exclude developing cellulitis or developing hematomas in these regions.   Electronically Signed   By: Maisie Fus  Register   On: 09/20/2014 21:55   Mr Maxine Glenn Head Wo Contrast  09/19/2014   ADDENDUM REPORT: 09/19/2014 10:07  ADDENDUM: Study discussed by telephone with Dr. Kerry Hough On 09/19/2014 at 0958 hrs.   Electronically Signed   By: Augusto Gamble M.D.   On: 09/19/2014 10:07   09/19/2014   CLINICAL DATA:  68 year old female with left MCA infarct detected by CT presenting with code stroke, altered mental status. Initial encounter.  EXAM: MRI HEAD WITHOUT CONTRAST  MRA HEAD WITHOUT CONTRAST  TECHNIQUE: Multiplanar, multiecho pulse sequences of the brain and surrounding structures were obtained without intravenous contrast. Angiographic images of the head were obtained using MRA technique without contrast.  COMPARISON:  Head CT without contrast 09/18/2014.  FINDINGS: MRI HEAD FINDINGS  Large confluent and intensely restricted  diffusion in the left hemisphere extending from the insula and operculum posteriorly, involving most of the left occipital lobe as well as the posterior temporal lobe. Small nodular areas of restricted diffusion in the left basal ganglia, which otherwise are spared. Superimposed multiple bilateral punctate and nodular foci of restricted diffusion, including involvement of the right MCA and PCA territories. Furthermore there are multiple nodular foci restricted diffusion in both cerebellar hemispheres. See series 100.  Cytotoxic edema in the affected territories. Still, intracranial mass effect is Mild at this time, with Trace rightward midline shift. No definite petechial hemorrhage.  Major intracranial vascular flow voids are preserved.  Cavum septum pellucidum. Mass effect on the left lateral ventricle. No ventriculomegaly. Basilar cisterns remain patent. Negative pituitary, cervicomedullary junction, and grossly negative visualized cervical spine. Chronic lacunar infarcts superimposed in the right corona radiata and right superior frontal gyrus subcortical white matter (with small focus of cortical encephalomalacia).  Visualized orbit soft tissues are within normal limits. Visualized paranasal sinuses and mastoids are clear. Normal bone marrow signal. No acute scalp soft tissue findings.  MRA HEAD FINDINGS  Antegrade flow in the posterior circulation with codominant distal vertebral arteries. Patent vertebrobasilar junction. Right PICA origin is patent. Left PICA less well visualized. Basilar artery irregularity without hemodynamically significant stenosis. Both PCA origins are patent. The right posterior communicating artery is present, the left is diminutive or absent. Bilateral PCA branches are within normal limits.  Antegrade flow in both ICA siphons. Bilateral siphon irregularity is moderate. No definite hemodynamically significant ICA siphon stenosis. Patent carotid termini. Normal right MCA and bilateral  ACA origins.  The left MCA is occluded in the M1 segment 10 mm beyond its origin. Paucity of flow signal only in the left MCA anterior division M2 branches.  Anterior communicating artery diminutive or absent. Visualized bilateral ACA branches are within normal  limits. Right MCA M1 segment and bifurcation are patent with mild to moderate irregularity. No definite M2 branch occlusion on the right.  IMPRESSION: 1. Large acute left MCA infarct. Cytotoxic edema and mass effect but no associated hemorrhage at this time. 2. Numerous scattered small acute infarcts in the bilateral MCA, right PCA, and bilateral PICA territories. Constellation most compatible with recent embolic event (cardiac or proximal aortic source). 3. Left MCA occlusion in the M1 segment. Some reconstituted flow only in the anterior left MCA division. 4. No other major circle of Willis branch occlusion. Moderate irregularity of both ICA siphons.  Electronically Signed: By: Augusto Gamble M.D. On: 09/19/2014 09:44   Mr Brain Wo Contrast  09/19/2014   ADDENDUM REPORT: 09/19/2014 10:07  ADDENDUM: Study discussed by telephone with Dr. Kerry Hough On 09/19/2014 at 0958 hrs.   Electronically Signed   By: Augusto Gamble M.D.   On: 09/19/2014 10:07   09/19/2014   CLINICAL DATA:  68 year old female with left MCA infarct detected by CT presenting with code stroke, altered mental status. Initial encounter.  EXAM: MRI HEAD WITHOUT CONTRAST  MRA HEAD WITHOUT CONTRAST  TECHNIQUE: Multiplanar, multiecho pulse sequences of the brain and surrounding structures were obtained without intravenous contrast. Angiographic images of the head were obtained using MRA technique without contrast.  COMPARISON:  Head CT without contrast 09/18/2014.  FINDINGS: MRI HEAD FINDINGS  Large confluent and intensely restricted diffusion in the left hemisphere extending from the insula and operculum posteriorly, involving most of the left occipital lobe as well as the posterior temporal lobe. Small  nodular areas of restricted diffusion in the left basal ganglia, which otherwise are spared. Superimposed multiple bilateral punctate and nodular foci of restricted diffusion, including involvement of the right MCA and PCA territories. Furthermore there are multiple nodular foci restricted diffusion in both cerebellar hemispheres. See series 100.  Cytotoxic edema in the affected territories. Still, intracranial mass effect is Mild at this time, with Trace rightward midline shift. No definite petechial hemorrhage.  Major intracranial vascular flow voids are preserved.  Cavum septum pellucidum. Mass effect on the left lateral ventricle. No ventriculomegaly. Basilar cisterns remain patent. Negative pituitary, cervicomedullary junction, and grossly negative visualized cervical spine. Chronic lacunar infarcts superimposed in the right corona radiata and right superior frontal gyrus subcortical white matter (with small focus of cortical encephalomalacia).  Visualized orbit soft tissues are within normal limits. Visualized paranasal sinuses and mastoids are clear. Normal bone marrow signal. No acute scalp soft tissue findings.  MRA HEAD FINDINGS  Antegrade flow in the posterior circulation with codominant distal vertebral arteries. Patent vertebrobasilar junction. Right PICA origin is patent. Left PICA less well visualized. Basilar artery irregularity without hemodynamically significant stenosis. Both PCA origins are patent. The right posterior communicating artery is present, the left is diminutive or absent. Bilateral PCA branches are within normal limits.  Antegrade flow in both ICA siphons. Bilateral siphon irregularity is moderate. No definite hemodynamically significant ICA siphon stenosis. Patent carotid termini. Normal right MCA and bilateral ACA origins.  The left MCA is occluded in the M1 segment 10 mm beyond its origin. Paucity of flow signal only in the left MCA anterior division M2 branches.  Anterior  communicating artery diminutive or absent. Visualized bilateral ACA branches are within normal limits. Right MCA M1 segment and bifurcation are patent with mild to moderate irregularity. No definite M2 branch occlusion on the right.  IMPRESSION: 1. Large acute left MCA infarct. Cytotoxic edema and mass effect but no associated hemorrhage  at this time. 2. Numerous scattered small acute infarcts in the bilateral MCA, right PCA, and bilateral PICA territories. Constellation most compatible with recent embolic event (cardiac or proximal aortic source). 3. Left MCA occlusion in the M1 segment. Some reconstituted flow only in the anterior left MCA division. 4. No other major circle of Willis branch occlusion. Moderate irregularity of both ICA siphons.  Electronically Signed: By: Augusto Gamble M.D. On: 09/19/2014 09:44   US Soft Tissue Head/neck  08/31/2014   CLINICAL DATA:  thyroid nodule. FNA biopsy of dominant right nodule 09/07/2013.  EXAM: THYROID ULTRASOUND  TECHNIQUE: Ultrasound examination of the thyroid gland and adjacent soft tissues was performed.  COMPARISON:  08/04/2013  FINDINGS: Right thyroid lobe  Measurements: 50 x 23 x 32 mm. Dominant 36 x 20 x 25 mm complex solid nodule, mid lobe (previously 19 x 26 x 29). Adjacent 9 x 6 mm complex cyst at its inferior margin (previously 6 x 8 x 9).  Left thyroid lobe  Measurements: 48 x 18 x 27 mm. Markedly inhomogeneous background echotexture. Poorly marginated 14 x 7 x 10 mm hypoechoic lesion, mid lobe (previously 7 x 9 x 8). 8 x 6 x 9 mm complex cyst, inferior pole.  Isthmus  Thickness: 3 mm.  No nodules visualized.  Lymphadenopathy  None visualized.  IMPRESSION: 1. Thyromegaly with bilateral nodules. Enlargement of dominant right lesion; correlate with previous biopsy results. No other lesion meets current consensus criteria for biopsy. Follow-up by clinical exam is recommended. If patient has known risk factors for thyroid carcinoma, consider follow-up ultrasound in  12 months. If patient is clinically hyperthyroid, consider nuclear medicine thyroid uptake and scan. This recommendation follows the consensus statement: Management of Thyroid Nodules Detected as [place]: Society of Radiologists in Ultrasound Consensus Conference Statement. Radiology 2005; [id number].   Electronically Signed   By: Oley Balm M.D.   On: 08/31/2014 14:30   Dg Chest Port 1 View  09/21/2014   CLINICAL DATA:  68 year old female with shortness of breath. Left MCA infarct, aspiration pneumonia.  EXAM: PORTABLE CHEST - 1 VIEW  COMPARISON:  09/18/2014 and prior chest radiographs dating back to 05/08/2000  FINDINGS: Cardiomegaly again noted.  Elevation of the right hemidiaphragm and bibasilar atelectasis again noted.  Pulmonary vascular congestion again identified.  There has been interval improvement in left lung aeration.  There is no evidence of pneumothorax.  No other significant change identified.  IMPRESSION: Improved left lung aeration without other significant change.   Electronically Signed   By: Laveda Abbe M.D.   On: 09/21/2014 08:15   Dg Chest Portable 1 View  09/18/2014   CLINICAL DATA:  Stroke.  EXAM: PORTABLE CHEST - 1 VIEW  COMPARISON:  Chest CT 05/11/2014  FINDINGS: Single view of the chest demonstrates low lung volumes. Patchy airspace densities in the left lung. There may be subtle airspace disease in the right lung. Heart size is accentuated by the low lung volumes.  IMPRESSION: Patchy airspace disease, particularly in the left lung. Findings are concerning for pneumonia or aspiration. Asymmetric pulmonary edema is also in the differential diagnosis.   Electronically Signed   By: Richarda Overlie M.D.   On: 09/18/2014 17:10    Medications: Scheduled Meds: . aspirin  300 mg Rectal Daily   Or  . aspirin  325 mg Oral Daily  . clindamycin (CLEOCIN) IV  600 mg Intravenous 3 times per day  . fluticasone  2 spray Each Nare Daily  . ipratropium-albuterol  3 mL  Nebulization QID  .  levothyroxine  37.5 mcg Intravenous Daily  . methylPREDNISolone (SOLU-MEDROL) injection  60 mg Intravenous Q12H  . pantoprazole (PROTONIX) IV  40 mg Intravenous Q12H      LOS: 4 days   RAI,RIPUDEEP M.D. [facility] 09/22/2014, 12:58 PM Pager: [phone]  If 7PM-7AM, please contact night-coverage [email or web address] Password [email or web address]

## 2014-09-22 NOTE — Clinical Social Work Placement (Addendum)
Clinical Social Work Department CLINICAL SOCIAL WORK PLACEMENT NOTE 09/21/2014  Patient:  Melissa [name], Melissa [name]  Account Number:  [id number] Admit date:  09/18/2014  Clinical Social Worker:  Greta Doom, LCSWA  Date/time:  09/21/2014 04:30 PM  Clinical Social Work is seeking post-discharge placement for this patient at the following level of care:   SKILLED NURSING   (*CSW will update this form in Epic as items are completed)   09/21/2014  Patient/family provided with [facility] Department of Clinical Social Work's list of facilities offering this level of care within the geographic area requested by the patient (or if unable, by the patient's family).  09/21/2014  Patient/family informed of their freedom to choose among providers that offer the needed level of care, that participate in Medicare, Medicaid or managed care program needed by the patient, have an available bed and are willing to accept the patient.  09/21/2014  Patient/family informed of [facility]' ownership interest in [facility], as well as of the fact that they are under no obligation to receive care at this facility.  PASARR submitted to EDS on 09/21/2014 PASARR number received on 09/21/2014  FL2 transmitted to all facilities in geographic area requested by pt/family on  09/21/2014 FL2 transmitted to all facilities within larger geographic area on 09/21/2014  Patient informed that his/her managed care company has contracts with or will negotiate with  certain facilities, including the following:     Patient/family informed of bed offers received:  09/22/2014 Patient chooses bed at [facility] Physician recommends and patient chooses bed at    [facility]  on  10/01/2014 Patient to be transferred to facility by [facility] Patient and family notified of transfer on 10/01/2014 Name of family member notified:  Jonelle Sidle (daughter)  The following physician request were entered in  Epic:   Additional Comments:  Odin, MSW, [place]

## 2014-09-22 NOTE — Progress Notes (Signed)
Occupational Therapy Evaluation Patient Details Name: Tawania Dimichele Walby MRN: [id number] DOB: Nov 16, 1946 Today's Date: 09/22/2014    History of Present Illness Pt is a 68 y.o female presenting s/p L MCA ischemic CVA with R sided weakness and R facial droop. TPA was not administered. Hx of  intermittent vertigo, hypothyroidism, hyperlipidemia, obesity, HTN, R nephrectomy am renal cell carcinoma. Pt receiving chemotherapy treatment for multiple myeloma.   Clinical Impression   PTA, pt independent with ADL and mobility. Pt presents with significant deficits as listed below and is an excellent CIR candidate, however, family appears to be interested in rehab at SNF closer to patient's home. Educated daughter on proper positioning RUE in bed. Pt will benefit from skilled OT services to facilitate D/C to next venue due to below deficits.    Follow Up Recommendations  SNF;Supervision/Assistance - 24 hour    Equipment Recommendations  3 in 1 bedside comode;Tub/shower bench;Wheelchair (measurements OT);Wheelchair cushion (measurements OT)    Recommendations for Other Services       Precautions / Restrictions Precautions Precautions: Fall      Mobility Bed Mobility Overal bed mobility: Needs Assistance Bed Mobility: Supine to Sit;Sit to Supine     Supine to sit: Mod assist;HOB elevated Sit to supine: Max assist;HOB elevated   General bed mobility comments: with HOB elevated, pt using rail to push self into sitting position. Physical assistance to lift RLE off bed  Transfers                 General transfer comment: need +2 assistance    Balance Overall balance assessment: Needs assistance Sitting-balance support: Feet supported;Single extremity supported Sitting balance-Leahy Scale: Poor Sitting balance - Comments: R and posterior bias Postural control: Posterior lean;Right lateral lean (however, able t achieve midline posutral control at end of s)                                   ADL Overall ADL's : Needs assistance/impaired Eating/Feeding: NPO   Grooming: Maximal assistance;Sitting   Upper Body Bathing: Maximal assistance;Sitting   Lower Body Bathing: Maximal assistance;Bed level   Upper Body Dressing : Maximal assistance;Sitting   Lower Body Dressing: Total assistance;Bed level   Toilet Transfer: +2 for physical assistance           Functional mobility during ADLs: +2 for physical assistance General ADL Comments: will need +2 Max a for mobility     Vision                 Additional Comments: L gaze preference. Difficulty tracking into R visual field   Perception Perception Perception Tested?: No   Praxis Praxis Praxis tested?: Deficits Deficits: Ideomotor Praxis-Other Comments: Pt able to rub lotion in after hand over hand to initiate. Family statesshe is automatically scratchingnose, wiping off mouth    Pertinent Vitals/Pain Pain Assessment: Faces Faces Pain Scale: No hurt     Hand Dominance Right   Extremity/Trunk Assessment Upper Extremity Assessment Upper Extremity Assessment: RUE deficits/detail RUE Deficits / Details: Brunstorm level 1 - no active movement, however, pt pulls into flexor synergy in resonse to painful stimuli RUE Sensation: decreased light touch RUE Coordination: decreased fine motor;decreased gross motor (nonfunctional use RUE)   Lower Extremity Assessment Lower Extremity Assessment: Defer to PT evaluation   Cervical / Trunk Assessment Cervical / Trunk Assessment: Other exceptions Cervical / Trunk Exceptions: R trunk shortening   Communication Communication Communication:  Receptive difficulties;Expressive difficulties   Cognition Arousal/Alertness: Awake/alert Behavior During Therapy: Flat affect Overall Cognitive Status: Impaired/Different from baseline Area of Impairment: Attention;Following commands;Safety/judgement;Awareness;Problem solving   Current Attention Level:  Selective   Following Commands: Follows one step commands inconsistently           General Comments       Exercises Exercises: Other exercises Other Exercises Other Exercises: Educated daughter on need o keep R hand elevated to decrease dependent edema Other Exercises: R hand prom   Shoulder Instructions      Home Living Family/patient expects to be discharged to:: Skilled nursing facility                                        Prior Functioning/Environment Level of Independence: Independent        Comments: sewing, crafts, cookingn    OT Diagnosis: Generalized weakness;Cognitive deficits;Disturbance of vision;Hemiplegia dominant side;Apraxia   OT Problem List: Decreased strength;Decreased range of motion;Decreased activity tolerance;Impaired balance (sitting and/or standing);Impaired vision/perception;Decreased coordination;Decreased cognition;Decreased safety awareness;Decreased knowledge of use of DME or AE;Impaired sensation;Impaired tone;Obesity;Impaired UE functional use;Increased edema   OT Treatment/Interventions: Self-care/ADL training;Therapeutic exercise;Neuromuscular education;DME and/or AE instruction;Therapeutic activities;Cognitive remediation/compensation;Visual/perceptual remediation/compensation;Patient/family education;Balance training    OT Goals(Current goals can be found in the care plan section) Acute Rehab OT Goals Patient Stated Goal: unable to state OT Goal Formulation: With family Time For Goal Achievement: 10/06/14 Potential to Achieve Goals: Good  OT Frequency: Min 2X/week   Barriers to D/C:            Co-evaluation              End of Session Nurse Communication: Mobility status  Activity Tolerance: Patient tolerated treatment well Patient left: in bed;with call bell/phone within reach;with family/visitor present   Time: 2130-8657 OT Time Calculation (min): 41 min Charges:  OT General Charges $OT Visit: 1  Procedure OT Evaluation $Initial OT Evaluation Tier I: 1 Procedure OT Treatments $Self Care/Home Management : 23-37 mins G-Codes:    Jakhari Space,HILLARY 10/16/2014, 4:07 PM   [facility], OTR/L  [phone] Oct 16, 2014

## 2014-09-22 NOTE — Clinical Social Work Psychosocial (Signed)
Clinical Social Work Department BRIEF PSYCHOSOCIAL ASSESSMENT 09/22/2014  Patient:  Melissa [name], Melissa [name]     Account Number:  [id number]     Admit date:  09/18/2014  Clinical Social Worker:  Marciano Sequin  Date/Time:  09/21/2014 4:11 PM  Referred by:  RN  Date Referred:  09/21/2014 Referred for  SNF Placement   Other Referral:   Interview type:  Family Other interview type:   Pt's is non-verbal. Junius Creamer [phone]    PSYCHOSOCIAL DATA Living Status:  FAMILY Admitted from facility:   Level of care:   Primary support name:  [name] Broadnax Primary support relationship to patient:  CHILD, ADULT Degree of support available:   Strong    CURRENT CONCERNS  Other Concerns:    SOCIAL WORK ASSESSMENT / PLAN CSW met with the pt and her daughter Tiffant at the bedside. CSW introduce self and purpose of the visit. Pt presented with a calm mood and normal affect. CSW explained the clincial receommendation regardingn rehab. [name] confirmed that the pt lives in [place]. [name] expressed internest in SNF located in [place] because she lives there. CSW and [name] reviewed the SNF list. CSW explained the SNF process to [place]. [name] reported needing to talk to her sister Reita May to make a decsion on which SNF they would like their mother to transition to. CSW provided [name] with contact information if further questions are needed.  CSW will contine to assist and aid with dicharge.   Assessment/plan status:  Information/Referral to [employer] Other assessment/ plan:   Information/referral to community resources:   SNF List    PATIENT'S/FAMILY'S RESPONSE TO PLAN OF CARE: agreed   Greta Doom, MSW, [place]

## 2014-09-22 NOTE — Progress Notes (Signed)
STROKE TEAM PROGRESS NOTE   HISTORY Clarann Helvey Boback is an 68 y.o. female with a past medical history significant for HTN, hyperlipidemia, renal cell carcinoma s/p right nephrectomy, multiple myeloma on chemotherapy, transferred to [facility] for further stroke management. Patient has altered mental status and seems to be aphasic, family is unavailable, and thus all clinical information was obtained from the chart. She was initially evaluated at [facility] where notes indicated that " she was brought to the hospital by EMS due to altered mental status. The patient was found by her daughter at approximately 2 PM. She had fallen on the ground at an unknown time and was found on the floor by her daughter. She was altered at that point and had weakness of her right side including right facial droop and was not verbalizing. She was last known to be well last night. Code stroke was called alone neurology determined not to proceed with TPA due to unknown time of onset". At time of consult, she is lethargic but open eyes on verbal commands. On aspirin. last known well unknown.   MRI/MRA brain" large acute left MCA infarct. Cytotoxic edema and mass effect but no associated hemorrhage at this time. Numerous scattered small acute infarcts in the bilateral MCA, right PCA, and bilateral PICA territories.  Constellation most compatible with recent embolic event (cardiac or proximal aortic source). Left MCA occlusion in the M1 segment. Some reconstituted flow only in the anterior left MCA division.   Patient was not administered TPA secondary to delay in arrival. She was admitted to a step down unit for further evaluation and treatment.   SUBJECTIVE (INTERVAL HISTORY) Pt is more awake alert than yesterday but still global aphasia and right hemiplegia. Schedule TEE tomorrow.   OBJECTIVE Temp:  [97.2 F (36.2 C)-98.7 F (37.1 C)] 98 F (36.7 C) (10/01 1200) Pulse Rate:  [68-77] 77 (10/01 1837) Cardiac Rhythm:  [-] Normal  sinus rhythm (10/01 0800) Resp:  [13-19] 15 (10/01 1837) BP: (138-169)/(44-77) 166/70 mmHg (10/01 1837) SpO2:  [97 %-100 %] 100 % (10/01 1837)   Recent Labs Lab 09/21/14 1158 09/21/14 1807 09/21/14 2358 09/22/14 0730 09/22/14 1224  GLUCAP 128* 148* 166* 154* 166*    Recent Labs Lab 09/18/14 1558 09/19/14 1004 09/20/14 1525 09/20/14 2155 09/21/14 0347 09/22/14 0348  NA 137 140 141  --  143 146  K 4.0 4.1 3.6*  --  4.2 3.9  CL 100 103 103  --  105 107  CO2 _0 --  23 24  GLUCOSE 158* 117* 94  --  120* 162*  BUN 26* 26* 25*  --  29* 39*  CREATININE 1.44* 1.54* 1.59*  --  1.71* 1.76*  CALCIUM 9.1 9.0 8.8  --  9.0 9.5  MG  --   --  2.2 2.2 2.3  --     Recent Labs Lab 09/18/14 1558 09/20/14 1525 09/21/14 0347 09/22/14 0348  AST 13 28 51* 25  ALT 20 29 60* 62*  ALKPHOS 39 41 44 47  BILITOT 0.5 0.7 0.8 0.7  PROT 6.8 6.2 6.7 6.9  ALBUMIN 2.8* 2.3* 2.4* 2.5*    Recent Labs Lab 09/18/14 1558 09/19/14 1004 09/20/14 1525 09/21/14 0347 09/22/14 0348  WBC 16.6* 12.6* 10.7* 11.1* 9.6  NEUTROABS 14.0*  --  8.1* 8.8*  --   HGB 11.5* 10.6* 10.0* 10.9* 10.6*  HCT 31.6* 29.4* 28.7* 31.1* 30.0*  MCV 86.6 86.5 88.3 88.6 88.8  PLT 42* 30* 39* 54* 100*  Recent Labs Lab 09/19/14 0455 09/19/14 1004 09/20/14 2155 09/21/14 0347 09/21/14 1142  TROPONINI 0.33* 0.32* <0.30 <0.30 <0.30   No results found for this basename: LABPROT, INR,  in the last 72 hours No results found for this basename: COLORURINE, APPERANCEUR, LABSPEC, [place], GLUCOSEU, HGBUR, BILIRUBINUR, KETONESUR, PROTEINUR, UROBILINOGEN, NITRITE, LEUKOCYTESUR,  in the last 72 hours     Component Value Date/Time   CHOL 171 09/19/2014 0455   TRIG 182* 09/19/2014 0455   HDL 79 09/19/2014 0455   CHOLHDL 2.2 09/19/2014 0455   VLDL 36 09/19/2014 0455   LDLCALC 56 09/19/2014 0455   Lab Results  Component Value Date   HGBA1C 6.4* 09/19/2014   No results found for this basename: labopia,  cocainscrnur,   labbenz,  amphetmu,  thcu,  labbarb    No results found for this basename: ETH,  in the last 168 hours  Ct Head Wo Contrast 09/18/2014   Large LEFT MCA territory infarct involving LEFT temporal and parietal lobes.  No acute intracranial hemorrhage or midline shift.  Old small high RIGHT frontal and questionable LEFT cerebellar infarcts.    Mr Brain Wo Contrast 09/19/2014   1. Large acute left MCA infarct. Cytotoxic edema and mass effect but no associated hemorrhage at this time. 2. Numerous scattered small acute infarcts in the bilateral MCA, right PCA, and bilateral PICA territories. Constellation most compatible with recent embolic event (cardiac or proximal aortic source).   Mr Jodene Nam Head Wo Contrast 09/19/2014   3. Left MCA occlusion in the M1 segment. Some reconstituted flow only in the anterior left MCA division. 4. No other major circle of Willis branch occlusion. Moderate irregularity of both ICA siphons.    Dg Chest Portable 1 View 09/18/2014    Patchy airspace disease, particularly in the left lung. Findings are concerning for pneumonia or aspiration. Asymmetric pulmonary edema is also in the differential diagnosis.     2D Echocardiogram  - Mild LVH with LVEF 26-83%, grade 1 diastolic dysfunction. Mild mitral regurgitation. Mildly dilated RV with normal contraction. Trivial tricuspid regurgitation, PASP 52 mm mercury. No definitive PFO or ASD. No pericardial effusion. No source of embolus.   Carotid Doppler  No evidence of hemodynamically significant internal carotid artery stenosis - 1-39% internal carotid artery stenosis bilaterally. Vertebral artery flow is antegrade.   Ct Abdomen Pelvis Chest Wo Contrast 09/21/2014    1. New severe bilateral pulmonary infiltrates most consistent with pneumonia. Pulmonary edema could present in this fashion. 2. Endotracheal and right endobronchial soft tissue densities, most likely mucous plugging. Possibility of a and bronchial primary or metastatic  lesion cannot be excluded. 3. Right nephrectomy. Nephrectomy bed appears stable. No recurrent mass. 4. Mild subcutaneous edema noted over the lower flanks. Developing obstructive could present in this fashion. Clinical correlation is is suggested to exclude developing cellulitis or developing hematomas in these regions.     Venous doppler LEs - pending  PHYSICAL EXAM Temp:  [97.2 F (36.2 C)-98.7 F (37.1 C)] 98 F (36.7 C) (10/01 1200) Pulse Rate:  [68-77] 77 (10/01 1837) Resp:  [13-19] 15 (10/01 1837) BP: (138-169)/(44-77) 166/70 mmHg (10/01 1837) SpO2:  [97 %-100 %] 100 % (10/01 1837)  General - obese, well developed, not in acute distress.  Ophthalmologic - not able to see through.  Cardiovascular - Regular rate and rhythm with no murmur.  Mental Status -  Awake, alert, but global aphasia, not following commands.  Cranial Nerves II - XII - II - Visual neglect on the  right side. III, IV, VI - Extraocular movements showed left side eye gaze V - Facial sensation not able to test. VII - right facial droop. VIII - not able to test but track for voice. X - not cooperative. XI - not cooperative XII - Tongue in middle inside mouth.  Motor Strength - The patient's strength was hemiplegic on the right. RLE withdraw to pain but LUE and LLE spontaneous movement.  Bulk was normal and fasciculations were absent.   Motor Tone - Muscle tone was assessed at the neck and appendages and was normal.  Reflexes - The patient's reflexes were normal in all extremities and she had bilateral babinski.  Sensory - respond to pain stimulation.    Coordination - not able to test.  Gait and Station - not tested.   ASSESSMENT/PLAN Ms. Cara Thaxton Bence is a 68 y.o. female with history of hypertension, hyperlipidemia, thyroid disease with increased size (bx held given dx multiple myeloma), renal cell cancer s/p nephrectomy and no recurrent at surgical bed, and recently diagnosed multiple myeloma on  chemo presenting with sudden onset right hemiparesis. She did not receive IV t-PA due to delay in arrival. MRI imaging confirms a left MCA infarct, as well as numerous scattered small acute infarcts in the bilateral MCA, right PCA, and bilateral PICA territories, consistent with embolic most likely secondary to either cardioembolic or hypercoagulable state from malignancy or chemo drug - induced (Revlimid).   Stroke:  Large left MCA infarct in setting of L MCA M1 occlusion, and numerous scattered small acute infarcts in the bilateral MCA, right PCA, and bilateral PICA territories, Consistent with embolic most likely secondary to either cardioembolic or hypercoagulable state from malignancy.    MRI  Large lefe MCA, but also Numerous scattered small acute infarcts in the bilateral MCA, right PCA, and bilateral PICA territories   MRA  L MCA M1 occlusion  Carotid Doppler  No significant stenosis   2D Echo  No source of embolus  Recommend TEE to rule out cardiac source of emboli - scheduled in am  LE venous doppler - negative   aspirin 81 mg orally every day prior to admission, now on aspirin 300 mg suppository  HgbA1c 6.4  SCDs for VTE prophylaxis  NPO per ST  OOB   Malignancy   history of renal cell carcinoma, status post right radical nephrectomy in December 2008 with no evidence of disease on CT scan done on 04/20/2014  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  presented with LBP and concerning for MM metastasis to spine??  thyroid nodule enlargement as per family, bx on hold given acute cancer treatment  CT chest R lung mucous plugging vs other etiology  Dr. Farrel Gobble - on Revlimid, known to have risk of PNA and stroke   Hypertension   Permissive hypertension <220/120 for 24-48 hours  and then gradually normalize within 5-7 days  BP goal long term normotensive  Norvasc, lasix prior to admission BP 133-166/50-60 past 24h (09/22/2014 @ 6:53 PM)  Stable  Hyperlipidemia  Home meds:  mevacor 40, not resumed in hospital due to NPO status  LDL 56, goal < 100 (<70 for diabetics)  Continue statin at discharge  Other Stroke Risk Factors Advanced age   Morbid obesity, Body mass index is 39.55 kg/(m^2).  Other Active Problems  Aspiration pnemonia, placed on clindamycin  Thrombocythemia, due to chemo   Elevated troponin, thought to be due to large stroke. Cardiology consulted.  Hypothyroid, enlarged lobe, bx on hold given acute cancer treatment  Depression, on treatment  COPD, on treatment  CHF, pulmonary hypertension   Hypokalemia, resolved. K 3.9  hypothyroid  Other Pertinent History  CKD  Allergic rhinitis  Hospital day # 4  [facility] BIBY, MSN, RN, ANVP-BC, ANP-BC, [facility] Pager: [phone] 09/22/2014 6:53 PM   I, the attending vascular neurologist, have personally obtained a history, examined the patient, evaluated laboratory data, individually viewed imaging studies, and formulated the assessment and plan of care.  I have made any additions or clarifications directly to the above note and agree with the findings and plan as currently documented.   Rosalin Hawking, MD PhD Stroke Neurology 09/22/2014 6:53 PM   To contact Stroke Continuity provider, please refer to [email or web address] After hours, contact General Neurology

## 2014-09-23 ENCOUNTER — Encounter (HOSPITAL_COMMUNITY): Payer: Self-pay

## 2014-09-23 ENCOUNTER — Encounter (HOSPITAL_COMMUNITY)
Admission: EM | Disposition: A | Discharge status: Skilled Nursing Facility | Payer: Self-pay | Source: Home / Self Care | Attending: Internal Medicine

## 2014-09-23 ENCOUNTER — Inpatient Hospital Stay (HOSPITAL_COMMUNITY): Payer: Medicare Other

## 2014-09-23 DIAGNOSIS — Q211 Atrial septal defect: Secondary | ICD-10-CM

## 2014-09-23 DIAGNOSIS — I349 Nonrheumatic mitral valve disorder, unspecified: Secondary | ICD-10-CM

## 2014-09-23 HISTORY — PX: TEE WITHOUT CARDIOVERSION: SHX5443

## 2014-09-23 LAB — COMPREHENSIVE METABOLIC PANEL
ALBUMIN: 2.6 g/dL — AB (ref 3.5–5.2)
ALK PHOS: 46 U/L (ref 39–117)
ALT: 44 U/L — ABNORMAL HIGH (ref 0–35)
ANION GAP: 14 (ref 5–15)
AST: 12 U/L (ref 0–37)
BILIRUBIN TOTAL: 0.5 mg/dL (ref 0.3–1.2)
BUN: 52 mg/dL — AB (ref 6–23)
CHLORIDE: 108 meq/L (ref 96–112)
CO2: 24 mEq/L (ref 19–32)
CREATININE: 1.72 mg/dL — AB (ref 0.50–1.10)
Calcium: 9.6 mg/dL (ref 8.4–10.5)
GFR calc Af Amer: 34 mL/min — ABNORMAL LOW (ref >90)
GFR calc non Af Amer: 29 mL/min — ABNORMAL LOW (ref >90)
GLUCOSE: 197 mg/dL — AB (ref 70–99)
Potassium: 3.7 mEq/L (ref 3.7–5.3)
Sodium: 146 mEq/L (ref 137–147)
Total Protein: 6.7 g/dL (ref 6.0–8.3)

## 2014-09-23 LAB — CULTURE, RESPIRATORY W GRAM STAIN: Special Requests: NORMAL

## 2014-09-23 LAB — GLUCOSE, CAPILLARY
GLUCOSE-CAPILLARY: 180 mg/dL — AB (ref 70–99)
GLUCOSE-CAPILLARY: 163 mg/dL — AB (ref 70–99)
Glucose-Capillary: 171 mg/dL — ABNORMAL HIGH (ref 70–99)

## 2014-09-23 LAB — CULTURE, RESPIRATORY

## 2014-09-23 SURGERY — ECHOCARDIOGRAM, TRANSESOPHAGEAL
Anesthesia: Moderate Sedation

## 2014-09-23 SURGERY — LOOP RECORDER IMPLANT
Anesthesia: LOCAL

## 2014-09-23 MED ORDER — LEVOTHYROXINE SODIUM 100 MCG IV SOLR
50.0000 ug | Freq: Every day | INTRAVENOUS | Status: DC
  Administered 2014-09-24 – 2014-09-30 (×7): 50 ug via INTRAVENOUS
  Administered 2014-10-01: 12:00:00 via INTRAVENOUS
  Filled 2014-09-23 (×8): qty 5

## 2014-09-23 MED ORDER — DIPHENHYDRAMINE HCL 50 MG/ML IJ SOLN
INTRAMUSCULAR | Status: AC
  Filled 2014-09-23: qty 1

## 2014-09-23 MED ORDER — FENTANYL CITRATE 0.05 MG/ML IJ SOLN
INTRAMUSCULAR | Status: DC | PRN
  Administered 2014-09-23: 25 ug via INTRAVENOUS

## 2014-09-23 MED ORDER — MIDAZOLAM HCL 10 MG/2ML IJ SOLN
INTRAMUSCULAR | Status: DC | PRN
  Administered 2014-09-23: 2 mg via INTRAVENOUS

## 2014-09-23 MED ORDER — JEVITY 1.2 CAL PO LIQD: 1000.0000 mL | ORAL | Status: DC

## 2014-09-23 MED ORDER — FENTANYL CITRATE 0.05 MG/ML IJ SOLN
INTRAMUSCULAR | Status: AC
  Filled 2014-09-23: qty 2

## 2014-09-23 MED ORDER — GLUCERNA 1.2 CAL PO LIQD
1000.0000 mL | ORAL | Status: DC
  Administered 2014-09-23 – 2014-09-28 (×4): 1000 mL
  Filled 2014-09-23 (×10): qty 1000

## 2014-09-23 MED ORDER — HYDRALAZINE HCL 20 MG/ML IJ SOLN
10.0000 mg | Freq: Four times a day (QID) | INTRAMUSCULAR | Status: DC | PRN
  Administered 2014-09-23 – 2014-09-25 (×3): 10 mg via INTRAVENOUS
  Filled 2014-09-23 (×3): qty 1

## 2014-09-23 MED ORDER — SODIUM CHLORIDE 0.9 % IV SOLN
INTRAVENOUS | Status: AC
  Administered 2014-09-23: 10:00:00 via INTRAVENOUS

## 2014-09-23 MED ORDER — MIDAZOLAM HCL 5 MG/ML IJ SOLN
INTRAMUSCULAR | Status: AC
  Filled 2014-09-23: qty 2

## 2014-09-23 MED ORDER — SODIUM CHLORIDE 0.9 % IV SOLN
3.0000 g | Freq: Four times a day (QID) | INTRAVENOUS | Status: DC
  Administered 2014-09-23 – 2014-09-26 (×12): 3 g via INTRAVENOUS
  Filled 2014-09-23 (×14): qty 3

## 2014-09-23 MED ORDER — BUTAMBEN-TETRACAINE-BENZOCAINE 2-2-14 % EX AERO
INHALATION_SPRAY | CUTANEOUS | Status: DC | PRN
  Administered 2014-09-23: 2 via TOPICAL

## 2014-09-23 NOTE — Progress Notes (Signed)
Patient's daughter informed RN not to place NG tube. Patient's daughter stated " I really want my mother to have speech therapist assess her one more time before you guys put the tube down there" RN paged speech therapist and waiting for call back. RN will continue to monitor. Aisha RN

## 2014-09-23 NOTE — Interval H&P Note (Signed)
History and Physical Interval Note:  09/23/2014 11:56 AM  Melissa [name]  has presented today for surgery, with the diagnosis of ENDOCARDITIS  The various methods of treatment have been discussed with the patient and family. After consideration of risks, benefits and other options for treatment, the patient has consented to  Procedure(s): TRANSESOPHAGEAL ECHOCARDIOGRAM (TEE) (N/A) as a surgical intervention .  The patient's history has been reviewed, patient examined, no change in status, stable for surgery.  I have reviewed the patient's chart and labs.  Questions were answered to the patient's satisfaction.     Tinita Brooker

## 2014-09-23 NOTE — Progress Notes (Signed)
UR complete.  Kyri Dai RN, MSN 

## 2014-09-23 NOTE — H&P (View-Only) (Signed)
ELECTROPHYSIOLOGY CONSULT NOTE  Patient ID: Melissa [name], MRN: [id number], DOB/AGE: [birth year] 68 y.o. Admit date: 09/18/2014 Date of Consult: 09/23/2014  Primary Physician: [name], MD Primary Cardiologist: new  Chief Complaint: stroke   HPI Melissa [name] is a 68 y.o. female  With recent stroke in context of chemo Rxc for MM, and prior Hx of Renal Cell CA with prior nephrectomy, Rcently, completed first cycle of therapy including Revlimid known to significantly increase risk of thromboembolism (to include CVA and even MI)   MRI  >>large MCA stroke and small scattered infarcts  Residual RHParesis and aphasia   Echo normal LV function with some RV enlargement and hypokinesis with eleveated pulm pressures     Past Medical History  Diagnosis Date  . Vertigo, intermittent   . Hypothyroidism   . Hyperlipidemia   . Obesity   . Hypertension     severe and resistant to treatment; 2008-negative left renal angiogram  . Vitiligo   . Degenerative disc disease, lumbar   . Degenerative disc disease, cervical   . Chest pain 2008    2008-normal coronary angiography  . Renal cell carcinoma     Right nephrectomy      Surgical History:  Past Surgical History  Procedure Laterality Date  . Tubal ligation  1983    Bilateral  . Nephrectomy      Right; secondary to renal cell carcinoma  . Colonoscopy N/A 04/21/2014    Procedure: COLONOSCOPY;  Surgeon: Malissa Hippo, MD;  Location: [facility];  Service: Endoscopy;  Laterality: N/A;  . Colonoscopy N/A 04/21/2014    Procedure: COLONOSCOPY;  Surgeon: Malissa Hippo, MD;  Location: [facility];  Service: Endoscopy;  Laterality: N/A;  1030     Home Meds: Prior to Admission medications   Medication Sig Start Date End Date Taking? Authorizing Provider  acyclovir (ZOVIRAX) 400 MG tablet Take 1 tablet (400 mg total) by mouth daily. 08/30/14  Yes Alla German, MD  amLODipine (NORVASC) 10 MG tablet Take 10 mg by  mouth every morning.   Yes Historical Provider, MD  aspirin 81 MG chewable tablet Chew 81 mg by mouth daily.   Yes Historical Provider, MD  atenolol (TENORMIN) 25 MG tablet Take 25 mg by mouth every morning.    Yes Historical Provider, MD  Bortezomib (VELCADE IJ) Inject as directed. Days 1, 4, 8, 11 every 21 days 09/05/14  Yes Historical Provider, MD  budesonide-formoterol (SYMBICORT) 160-4.5 MCG/ACT inhaler Inhale 2 puffs into the lungs daily as needed (Shortness Of Breath).    Yes Historical Provider, MD  calcitRIOL (ROCALTROL) 0.25 MCG capsule 0.25 mcg every Monday, Wednesday, and Friday.    Yes Historical Provider, MD  dexamethasone (DECADRON) 4 MG tablet Take 10 mg by mouth as directed. Take a dose the day before and the day after Velcade treatments.   Yes Historical Provider, MD  FLUoxetine (PROZAC) 10 MG tablet Take 10 mg by mouth every morning.   Yes Historical Provider, MD  fluticasone (FLONASE) 50 MCG/ACT nasal spray Place 2 sprays into both nostrils daily. 08/26/14  Yes Alla German, MD  furosemide (LASIX) 40 MG tablet Take 40 mg by mouth 2 (two) times daily.   Yes Historical Provider, MD  hydrALAZINE (APRESOLINE) 25 MG tablet Take 25 mg by mouth 3 (three) times daily.  06/26/12  Yes Kerri Perches, MD  hydrALAZINE (APRESOLINE) 50 MG tablet Take 50 mg by mouth 3 (three) times daily.   Yes Historical Provider,  MD  lenalidomide (REVLIMID) 25 MG capsule Take 25 mg by mouth as directed. Take 1 capsule daily for 14 days, rest 7 days, and repeat. 08/30/14  Yes Alla German, MD  levothyroxine (SYNTHROID, LEVOTHROID) 100 MCG tablet Take 100 mcg by mouth every Monday, Wednesday, and Friday.   Yes Historical Provider, MD  levothyroxine (SYNTHROID, LEVOTHROID) 50 MCG tablet Take 50 mcg by mouth every Tuesday, Thursday, Saturday, and Sunday.   Yes Historical Provider, MD  lovastatin (MEVACOR) 40 MG tablet Take 40 mg by mouth daily.   Yes Historical Provider, MD  meclizine (ANTIVERT) 25 MG tablet  Take 25 mg by mouth 3 (three) times daily as needed for dizziness.   Yes Historical Provider, MD  Multiple Vitamin (MULTIVITAMIN WITH MINERALS) TABS tablet Take 1 tablet by mouth daily.   Yes Historical Provider, MD  naproxen sodium (ALEVE) 220 MG tablet Take 220 mg by mouth 2 (two) times daily as needed.   Yes Historical Provider, MD  prochlorperazine (COMPAZINE) 10 MG tablet Take 1 tablet (10 mg total) by mouth every 6 (six) hours as needed for nausea or vomiting. 08/29/14  Yes Alla German, MD  spironolactone (ALDACTONE) 25 MG tablet Take 50 mg by mouth daily.    Yes Historical Provider, MD    Inpatient Medications:  . aspirin  300 mg Rectal Daily   Or  . aspirin  325 mg Oral Daily  . clindamycin (CLEOCIN) IV  600 mg Intravenous 3 times per day  . fluticasone  2 spray Each Nare Daily  . ipratropium-albuterol  3 mL Nebulization QID  . levothyroxine  37.5 mcg Intravenous Daily  . methylPREDNISolone (SOLU-MEDROL) injection  60 mg Intravenous Q12H  . pantoprazole (PROTONIX) IV  40 mg Intravenous Q12H    Allergies:  Allergies  Allergen Reactions  . Morphine Nausea And Vomiting    History   Social History  . Marital Status: Divorced    Spouse Name: N/A    Number of Children: 2  . Years of Education: N/A   Occupational History  . Nursing assistant    Social History Main Topics  . Smoking status: Never Smoker   . Smokeless tobacco: Never Used  . Alcohol Use: No  . Drug Use: No  . Sexual Activity: Yes    Birth Control/ Protection: None   Other Topics Concern  . Not on file   Social History Narrative   Three grandchildren      Family History  Problem Relation Age of Onset  . Heart disease Mother 75  . Hypertension Mother   . Colon cancer Father   . Hypertension Sister     x2  . Diabetes Sister     x2  . Heart failure Sister   . Lupus Brother      ROS:  Please see the history of present illness.   Negative except  not able to vocalize  All other systems  reviewed and negative.    Physical Exam:*   Blood pressure 162/69, pulse 70, temperature 97.5 F (36.4 C), temperature source Oral, resp. rate 20, height 5\' 5"  (1.651 m), weight 237 lb 10.5 oz (107.8 kg), SpO2 100.00%. General: Well developed, Morbidly obese  female in no acute distress. Head: Normocephalic, atraumatic, sclera non-icteric, no xanthomas, nares are without discharge. EENT: normal Lymph Nodes:  none Back: not examined, n ss Neck: Negative for carotid bruits.   Lungs: Clear bilaterally to auscultation without wheezes, rales, or rhonchi. Breathing is unlabored. Heart: RRR with S1 S2. No murmur ,  rubs, or gallops appreciated. Abdomen: Soft, non-tender, non-distended with normoactive bowel sounds. No hepatomegaly. No rebound/guarding. No obvious abdominal masses. Msk:  Strength and tone appear normal for age. Extremities: No clubbing or cyanosis. No  edema.  Distal pedal pulses are 2+ and equal bilaterally. Skin: Warm and Dry Neuro: Alert   CN III-XII intactaphasia with R Hparesis Psych:  Responds to questions with acknowledgemnet      Labs: Cardiac Enzymes  Recent Labs  09/20/14 2155 09/21/14 0347 09/21/14 1142  TROPONINI <0.30 <0.30 <0.30   CBC Lab Results  Component Value Date   WBC 9.6 09/22/2014   HGB 10.6* 09/22/2014   HCT 30.0* 09/22/2014   MCV 88.8 09/22/2014   PLT 100* 09/22/2014   PROTIME: No results found for this basename: LABPROT, INR,  in the last 72 hours Chemistry  Recent Labs Lab 09/23/14 0529  NA 146  K 3.7  CL 108  CO2 24  BUN 52*  CREATININE 1.72*  CALCIUM 9.6  PROT 6.7  BILITOT 0.5  ALKPHOS 46  ALT 44*  AST 12  GLUCOSE 197*   Lipids Lab Results  Component Value Date   CHOL 171 09/19/2014   HDL 79 09/19/2014   LDLCALC 56 09/19/2014   TRIG 182* 09/19/2014   BNP Pro B Natriuretic peptide (BNP)  Date/Time Value Ref Range Status  09/19/2014 10:04 AM 1843.0* 0 - 125 pg/mL Final  12/11/2007  2:00 AM 128.0*  Final    Miscellaneous Lab Results  Component Value Date   DDIMER 0.33 04/13/2012    Radiology/Studies:  Ct Abdomen Pelvis Wo Contrast  09/21/2014   CLINICAL DATA:  Evaluate for septic emboli. History of renal cell cancer, multiple myeloma.  EXAM: CT CHEST, ABDOMEN AND PELVIS WITHOUT CONTRAST  TECHNIQUE: Multidetector CT imaging of the chest, abdomen and pelvis was performed following the standard protocol without IV contrast.  COMPARISON:  CT 05/11/2014 and 04/20/2014.  FINDINGS: CT CHEST FINDINGS  Thoracic aorta normal caliber. Atherosclerotic vascular changes noted stable cardiomegaly. Coronary artery disease.  Shotty mediastinal lymph nodes.  Thoracic esophagus is unremarkable.  Endobronchial lesion is noted in the right mainstem bronchus, most likely mucous plugging. Similar findings noted in the trachea to a lesser degree. New also severe bilateral pulmonary infiltrates most consistent with pneumonia. Pulmonary edema could present this fashion. Tiny pleural effusions. No pneumothorax.  Calcified right thyroid lobe mass again noted. Chest wall is intact.  CT ABDOMEN AND PELVIS FINDINGS  Liver normal. Spleen normal. Pancreas normal. No biliary distention. The gallbladder is nondistended.  Right nephrectomy. Right surgical bed is unremarkable. No mass lesion. Left kidney is unremarkable. No hydronephrosis or obstructing ureteral stone. Foley catheter is noted in the bladder. Air is noted bladder pelvis is most likely from instrumentation. Uterus and adnexa are unremarkable.  No significant adenopathy. Aortoiliac atherosclerotic vascular disease.  Appendiceal region is normal. There is no bowel distention. No free air. No mesenteric mass. Mild subcutaneous edema noted over the lower flanks. Developing mild anasarca could present in this fashion. Correlation to exclude cellulitis or developing hematoma suggested.  Diffuse severe thoracolumbar spine degenerative change. Sclerotic changes about the SI joints are  stable.  IMPRESSION: 1. New severe bilateral pulmonary infiltrates most consistent with pneumonia. Pulmonary edema could present in this fashion. 2. Endotracheal and right endobronchial soft tissue densities, most likely mucous plugging. Possibility of a and bronchial primary or metastatic lesion cannot be excluded. 3. Right nephrectomy. Nephrectomy bed appears stable. No recurrent mass. 4. Mild subcutaneous edema noted over the  lower flanks. Developing obstructive could present in this fashion. Clinical correlation is is suggested to exclude developing cellulitis or developing hematomas in these regions.   Electronically Signed   By: Maisie Fus  Register   On: 09/20/2014 21:55   Ct Head Wo Contrast  09/18/2014   CLINICAL DATA:  Code stroke, altered level of consciousness  EXAM: CT HEAD WITHOUT CONTRAST  TECHNIQUE: Contiguous axial images were obtained from the base of the skull through the vertex without intravenous contrast.  COMPARISON:  None  FINDINGS: Cavum septum pellucidum and vergae.  Otherwise normal ventricular morphology.  No midline shift.  Old high RIGHT frontal infarct.  Large acute LEFT MCA territory infarct involving the LEFT temporal and parietal lobes with effacement of sulci.  No definite dense middle cerebral artery sign.  No intracranial hemorrhage, mass lesion or extra-axial fluid collections.  Low-attenuation at posterior aspect of LEFT cerebellar hemisphere may represent a small old infarct as well.  Minimal small vessel chronic ischemic changes of deep cerebral white matter.  No acute bone or sinus findings.  Bony excrescences are identified at the sphenoid wings bilaterally potentially developmental variants or exostoses though difficult to completely exclude calcified meningiomas, considered much less likely.  IMPRESSION: Large LEFT MCA territory infarct involving LEFT temporal and parietal lobes.  No acute intracranial hemorrhage or midline shift.  Old small high RIGHT frontal and  questionable LEFT cerebellar infarcts.  Critical Value/emergent results were called by telephone at the time of interpretation on 09/18/2014 at 1556 hr to Dr. Donnetta Hutching, who verbally acknowledged these results.   Electronically Signed   By: Ulyses Southward M.D.   On: 09/18/2014 15:58   Ct Chest Wo Contrast  09/20/2014   CLINICAL DATA:  Evaluate for septic emboli. History of renal cell cancer, multiple myeloma.  EXAM: CT CHEST, ABDOMEN AND PELVIS WITHOUT CONTRAST  TECHNIQUE: Multidetector CT imaging of the chest, abdomen and pelvis was performed following the standard protocol without IV contrast.  COMPARISON:  CT 05/11/2014 and 04/20/2014.  FINDINGS: CT CHEST FINDINGS  Thoracic aorta normal caliber. Atherosclerotic vascular changes noted stable cardiomegaly. Coronary artery disease.  Shotty mediastinal lymph nodes.  Thoracic esophagus is unremarkable.  Endobronchial lesion is noted in the right mainstem bronchus, most likely mucous plugging. Similar findings noted in the trachea to a lesser degree. New also severe bilateral pulmonary infiltrates most consistent with pneumonia. Pulmonary edema could present this fashion. Tiny pleural effusions. No pneumothorax.  Calcified right thyroid lobe mass again noted. Chest wall is intact.  CT ABDOMEN AND PELVIS FINDINGS  Liver normal. Spleen normal. Pancreas normal. No biliary distention. The gallbladder is nondistended.  Right nephrectomy. Right surgical bed is unremarkable. No mass lesion. Left kidney is unremarkable. No hydronephrosis or obstructing ureteral stone. Foley catheter is noted in the bladder. Air is noted bladder pelvis is most likely from instrumentation. Uterus and adnexa are unremarkable.  No significant adenopathy. Aortoiliac atherosclerotic vascular disease.  Appendiceal region is normal. There is no bowel distention. No free air. No mesenteric mass. Mild subcutaneous edema noted over the lower flanks. Developing mild anasarca could present in this fashion.  Correlation to exclude cellulitis or developing hematoma suggested.  Diffuse severe thoracolumbar spine degenerative change. Sclerotic changes about the SI joints are stable.  IMPRESSION: 1. New severe bilateral pulmonary infiltrates most consistent with pneumonia. Pulmonary edema could present in this fashion. 2. Endotracheal and right endobronchial soft tissue densities, most likely mucous plugging. Possibility of a and bronchial primary or metastatic lesion cannot be excluded. 3.  Right nephrectomy. Nephrectomy bed appears stable. No recurrent mass. 4. Mild subcutaneous edema noted over the lower flanks. Developing obstructive could present in this fashion. Clinical correlation is is suggested to exclude developing cellulitis or developing hematomas in these regions.   Electronically Signed   By: Maisie Fus  Register   On: 09/20/2014 21:55   Mr Maxine Glenn Head Wo Contrast  09/19/2014   ADDENDUM REPORT: 09/19/2014 10:07  ADDENDUM: Study discussed by telephone with Dr. Kerry Hough On 09/19/2014 at 0958 hrs.   Electronically Signed   By: Augusto Gamble M.D.   On: 09/19/2014 10:07   09/19/2014   CLINICAL DATA:  68 year old female with left MCA infarct detected by CT presenting with code stroke, altered mental status. Initial encounter.  EXAM: MRI HEAD WITHOUT CONTRAST  MRA HEAD WITHOUT CONTRAST  TECHNIQUE: Multiplanar, multiecho pulse sequences of the brain and surrounding structures were obtained without intravenous contrast. Angiographic images of the head were obtained using MRA technique without contrast.  COMPARISON:  Head CT without contrast 09/18/2014.  FINDINGS: MRI HEAD FINDINGS  Large confluent and intensely restricted diffusion in the left hemisphere extending from the insula and operculum posteriorly, involving most of the left occipital lobe as well as the posterior temporal lobe. Small nodular areas of restricted diffusion in the left basal ganglia, which otherwise are spared. Superimposed multiple bilateral punctate and  nodular foci of restricted diffusion, including involvement of the right MCA and PCA territories. Furthermore there are multiple nodular foci restricted diffusion in both cerebellar hemispheres. See series 100.  Cytotoxic edema in the affected territories. Still, intracranial mass effect is Mild at this time, with Trace rightward midline shift. No definite petechial hemorrhage.  Major intracranial vascular flow voids are preserved.  Cavum septum pellucidum. Mass effect on the left lateral ventricle. No ventriculomegaly. Basilar cisterns remain patent. Negative pituitary, cervicomedullary junction, and grossly negative visualized cervical spine. Chronic lacunar infarcts superimposed in the right corona radiata and right superior frontal gyrus subcortical white matter (with small focus of cortical encephalomalacia).  Visualized orbit soft tissues are within normal limits. Visualized paranasal sinuses and mastoids are clear. Normal bone marrow signal. No acute scalp soft tissue findings.  MRA HEAD FINDINGS  Antegrade flow in the posterior circulation with codominant distal vertebral arteries. Patent vertebrobasilar junction. Right PICA origin is patent. Left PICA less well visualized. Basilar artery irregularity without hemodynamically significant stenosis. Both PCA origins are patent. The right posterior communicating artery is present, the left is diminutive or absent. Bilateral PCA branches are within normal limits.  Antegrade flow in both ICA siphons. Bilateral siphon irregularity is moderate. No definite hemodynamically significant ICA siphon stenosis. Patent carotid termini. Normal right MCA and bilateral ACA origins.  The left MCA is occluded in the M1 segment 10 mm beyond its origin. Paucity of flow signal only in the left MCA anterior division M2 branches.  Anterior communicating artery diminutive or absent. Visualized bilateral ACA branches are within normal limits. Right MCA M1 segment and bifurcation are  patent with mild to moderate irregularity. No definite M2 branch occlusion on the right.  IMPRESSION: 1. Large acute left MCA infarct. Cytotoxic edema and mass effect but no associated hemorrhage at this time. 2. Numerous scattered small acute infarcts in the bilateral MCA, right PCA, and bilateral PICA territories. Constellation most compatible with recent embolic event (cardiac or proximal aortic source). 3. Left MCA occlusion in the M1 segment. Some reconstituted flow only in the anterior left MCA division. 4. No other major circle of Willis branch occlusion.  Moderate irregularity of both ICA siphons.  Electronically Signed: By: Augusto Gamble M.D. On: 09/19/2014 09:44   Mr Brain Wo Contrast  09/19/2014   ADDENDUM REPORT: 09/19/2014 10:07  ADDENDUM: Study discussed by telephone with Dr. Kerry Hough On 09/19/2014 at 0958 hrs.   Electronically Signed   By: Augusto Gamble M.D.   On: 09/19/2014 10:07   09/19/2014   CLINICAL DATA:  68 year old female with left MCA infarct detected by CT presenting with code stroke, altered mental status. Initial encounter.  EXAM: MRI HEAD WITHOUT CONTRAST  MRA HEAD WITHOUT CONTRAST  TECHNIQUE: Multiplanar, multiecho pulse sequences of the brain and surrounding structures were obtained without intravenous contrast. Angiographic images of the head were obtained using MRA technique without contrast.  COMPARISON:  Head CT without contrast 09/18/2014.  FINDINGS: MRI HEAD FINDINGS  Large confluent and intensely restricted diffusion in the left hemisphere extending from the insula and operculum posteriorly, involving most of the left occipital lobe as well as the posterior temporal lobe. Small nodular areas of restricted diffusion in the left basal ganglia, which otherwise are spared. Superimposed multiple bilateral punctate and nodular foci of restricted diffusion, including involvement of the right MCA and PCA territories. Furthermore there are multiple nodular foci restricted diffusion in both  cerebellar hemispheres. See series 100.  Cytotoxic edema in the affected territories. Still, intracranial mass effect is Mild at this time, with Trace rightward midline shift. No definite petechial hemorrhage.  Major intracranial vascular flow voids are preserved.  Cavum septum pellucidum. Mass effect on the left lateral ventricle. No ventriculomegaly. Basilar cisterns remain patent. Negative pituitary, cervicomedullary junction, and grossly negative visualized cervical spine. Chronic lacunar infarcts superimposed in the right corona radiata and right superior frontal gyrus subcortical white matter (with small focus of cortical encephalomalacia).  Visualized orbit soft tissues are within normal limits. Visualized paranasal sinuses and mastoids are clear. Normal bone marrow signal. No acute scalp soft tissue findings.  MRA HEAD FINDINGS  Antegrade flow in the posterior circulation with codominant distal vertebral arteries. Patent vertebrobasilar junction. Right PICA origin is patent. Left PICA less well visualized. Basilar artery irregularity without hemodynamically significant stenosis. Both PCA origins are patent. The right posterior communicating artery is present, the left is diminutive or absent. Bilateral PCA branches are within normal limits.  Antegrade flow in both ICA siphons. Bilateral siphon irregularity is moderate. No definite hemodynamically significant ICA siphon stenosis. Patent carotid termini. Normal right MCA and bilateral ACA origins.  The left MCA is occluded in the M1 segment 10 mm beyond its origin. Paucity of flow signal only in the left MCA anterior division M2 branches.  Anterior communicating artery diminutive or absent. Visualized bilateral ACA branches are within normal limits. Right MCA M1 segment and bifurcation are patent with mild to moderate irregularity. No definite M2 branch occlusion on the right.  IMPRESSION: 1. Large acute left MCA infarct. Cytotoxic edema and mass effect but no  associated hemorrhage at this time. 2. Numerous scattered small acute infarcts in the bilateral MCA, right PCA, and bilateral PICA territories. Constellation most compatible with recent embolic event (cardiac or proximal aortic source). 3. Left MCA occlusion in the M1 segment. Some reconstituted flow only in the anterior left MCA division. 4. No other major circle of Willis branch occlusion. Moderate irregularity of both ICA siphons.  Electronically Signed: By: Augusto Gamble M.D. On: 09/19/2014 09:44   US Soft Tissue Head/neck  08/31/2014   CLINICAL DATA:  thyroid nodule. FNA biopsy of dominant right nodule 09/07/2013.  EXAM: THYROID ULTRASOUND  TECHNIQUE: Ultrasound examination of the thyroid gland and adjacent soft tissues was performed.  COMPARISON:  08/04/2013  FINDINGS: Right thyroid lobe  Measurements: 50 x 23 x 32 mm. Dominant 36 x 20 x 25 mm complex solid nodule, mid lobe (previously 19 x 26 x 29). Adjacent 9 x 6 mm complex cyst at its inferior margin (previously 6 x 8 x 9).  Left thyroid lobe  Measurements: 48 x 18 x 27 mm. Markedly inhomogeneous background echotexture. Poorly marginated 14 x 7 x 10 mm hypoechoic lesion, mid lobe (previously 7 x 9 x 8). 8 x 6 x 9 mm complex cyst, inferior pole.  Isthmus  Thickness: 3 mm.  No nodules visualized.  Lymphadenopathy  None visualized.  IMPRESSION: 1. Thyromegaly with bilateral nodules. Enlargement of dominant right lesion; correlate with previous biopsy results. No other lesion meets current consensus criteria for biopsy. Follow-up by clinical exam is recommended. If patient has known risk factors for thyroid carcinoma, consider follow-up ultrasound in 12 months. If patient is clinically hyperthyroid, consider nuclear medicine thyroid uptake and scan. This recommendation follows the consensus statement: Management of Thyroid Nodules Detected as [place]: Society of Radiologists in Ultrasound Consensus Conference Statement. Radiology 2005; [id number].   Electronically  Signed   By: Oley Balm M.D.   On: 08/31/2014 14:30   Dg Chest Port 1 View  09/21/2014   CLINICAL DATA:  68 year old female with shortness of breath. Left MCA infarct, aspiration pneumonia.  EXAM: PORTABLE CHEST - 1 VIEW  COMPARISON:  09/18/2014 and prior chest radiographs dating back to 05/08/2000  FINDINGS: Cardiomegaly again noted.  Elevation of the right hemidiaphragm and bibasilar atelectasis again noted.  Pulmonary vascular congestion again identified.  There has been interval improvement in left lung aeration.  There is no evidence of pneumothorax.  No other significant change identified.  IMPRESSION: Improved left lung aeration without other significant change.   Electronically Signed   By: Laveda Abbe M.D.   On: 09/21/2014 08:15   Dg Chest Portable 1 View  09/18/2014   CLINICAL DATA:  Stroke.  EXAM: PORTABLE CHEST - 1 VIEW  COMPARISON:  Chest CT 05/11/2014  FINDINGS: Single view of the chest demonstrates low lung volumes. Patchy airspace densities in the left lung. There may be subtle airspace disease in the right lung. Heart size is accentuated by the low lung volumes.  IMPRESSION: Patchy airspace disease, particularly in the left lung. Findings are concerning for pneumonia or aspiration. Asymmetric pulmonary edema is also in the differential diagnosis.   Electronically Signed   By: Richarda Overlie M.D.   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

## 2014-09-23 NOTE — Progress Notes (Signed)
PT Cancellation Note  ___Treatment cancelled today due to medical issues with patient which prohibited therapy  ___ Treatment cancelled today due to patient receiving procedure or test   ___ Treatment cancelled today due to patient's refusal to participate   _X_ Treatment cancelled today due to multiple medical needs of TEE, ECHO and placement of PANDA  Rica Koyanagi  PTA [facility]  Acute  Rehab Pager      [phone]

## 2014-09-23 NOTE — Progress Notes (Addendum)
[facility] PROGRESS NOTE  Melissa [name] UVO:[id number] DOB: 08-Jun-1946 DOA: 09/18/2014 PCP: [name], MD  Assessment/Plan: Principal Problem:   CVA (cerebral infarction) Active Problems:   Aspiration pneumonia   Thrombocytopenia, unspecified   Elevated troponin   CKD (chronic kidney disease) stage 3, GFR 30-59 ml/min    Assessment/Plan:  Principal Problem:  Large Acute L MCA stroke and numerous scattered small acute infarcts in the bilateral MCA, right PCA, and bilateral PICA territories with right hemiparesis  - Findings consistent with cardiac/proximal aortic emboli vs coagulopathy from chemotherapy, malignancy;  MRI Large lefe MCA, but also Numerous scattered small acute infarcts in the bilateral MCA, right PCA, and bilateral PICA territories  MRA L MCA M1 occlusion  Carotid Doppler No significant stenosis  2D Echo No source of embolus  Recommend TEE to rule out cardiac source of emboli - scheduled for today LE venous doppler - negative  aspirin 81 mg orally every day prior to admission, now on aspirin 300 mg suppository  HgbA1c 6.4  SCDs for VTE prophylaxis  NPO per ST we'll start the patient on tube feeding via dophoff, nutrition consult ordered to start tube feeds - contact Dr. Alla German (Oncology) prior to d/c as Revlimid known to significantly increase risk of thromboembolism (to include CVA and even MI) - regardless, clearly not a current candidate for resumption of this drug    Aspiration pneumonia  Discontinue clindamycin and switch to Unasyn for a total of 8 days of treatment then DC. Continue Solu-Medrol, nebs, n.p.o. status   Dysphagia: Residual dysphagia from CVA  - Currently n.p.o. status, speech therapy following closely, if no meaningful improvement, discussed in detail with patient's daughter at the bedside regarding PANDA tube for now and initiate tube feeds Nutrition consult ordered  Diastolic CHF  -Due to permissive hypertension,  hold off on Coreg and Imdur for now,   Progressive renal failure  Increase IV fluids   Mildly Elevated Troponin  -Likely due to massive stroke. No EKG changes. Have since normalized . 2-D echo showed EF of 60-65%, no regional wall motion abnormalities.   Thrombocythemia  -Likley due to chemotherapy, improving  l  Multiple myeloma  -Patient just ended one cycle of treatment and is on a planned 7 day rest - doubt her candidacy to return to active tx, unless dramatic improvement in clinical status occurs   Hypertension  -Hold antihypertensives to maintain cranial perfusion - BP improved today   Hypothyroid  Continue IV Synthroid, at 50 mcg daily,    DVT Prophylaxis:SCD's  Code Status: Full code  Family Communication: Discussed in detail with patient's daughter at the bedside  Disposition:continue on neuro floor    Consultants:  Neurology  Cardiology for TEE Procedures:  9/27 CT head without contrast Large LEFT MCA territory infarct involving LEFT temporal and  parietal lobes.  -Old small high RIGHT frontal & questionable LEFT cerebellar infarcts.  9/27 PCXR Patchy airspace disease,>left lung. C/W pneumonia vs aspiration vs Asymmetric pulmonary edema  9/28 MRI/MRA head without contrast Large acute left MCA infarct. Cytotoxic edema and mass effect but no associated hemorrhage at this time.  -Numerous scattered small acute infarcts in the bilateral MCA/right PCA/bilateral PICA territories. most  C/W embolic event (cardiac or proximal aortic source).  -Left MCA occlusion in the M1 segment.  9/28 echocardiogram with contrast Left ventricle: mild LVH. -LVEF= 60% to 65%. -(grade 1 diastolic dysfunction). - Right ventricle: mildly dilated. - Right atrium: mildly dilated.  - Pulmonary arteries: PA peak pressure:  52 mm Hg (S).    Antibiotics:  Clindamycin 9/27>  Subjective:  Patient seen and examined, tracks with her eyes otherwise aphasia, daughter at bedside   Code Status:  full Family Communication: family updated about patient's clinical progress Disposition Plan:  As above    Brief narrative: 68 y.o. female with a past medical history significant for HTN, hyperlipidemia, renal cell carcinoma s/p right nephrectomy, multiple myeloma on chemotherapy, transferred to [facility] for further stroke management. Patient has altered mental status and seems to be aphasic, family is unavailable, and thus all clinical information was obtained from the chart. She was initially evaluated at [facility] where notes indicated that " she was brought to the hospital by EMS due to altered mental status. The patient was found by her daughter at approximately 2 PM. She had fallen on the ground at an unknown time and was found on the floor by her daughter. She was altered at that point and had weakness of her right side including right facial droop and was not verbalizing. She was last known to be well last night. Code stroke was called alone neurology determined not to proceed with TPA due to unknown time of onset". At time of consult, she is lethargic but open eyes on verbal commands. On aspirin. last known well unknown.  MRI/MRA brain" large acute left MCA infarct. Cytotoxic edema and mass effect but no associated hemorrhage at this time. Numerous scattered small acute infarcts in the bilateral MCA, right PCA, and bilateral PICA territories.  Constellation most compatible with recent embolic event (cardiac or proximal aortic source). Left MCA occlusion in the M1 segment. Some reconstituted flow only in the anterior left MCA division.  Patient was not administered TPA secondary to delay in arrival. She was admitted to a step down unit for further evaluation and treatment.      Consultants:  Neurology  Cardiology    Procedures: TEE  Antibiotics:  None  HPI/Subjective: Daughter by the bedside Requesting feeding to be initiated as the patient has had nothing by mouth and she is  hungry  Objective: Filed Vitals:   09/23/14 0200 09/23/14 0500 09/23/14 1003 09/23/14 1008  BP: 180/70 162/69 166/64 166/64  Pulse: 71 70  80  Temp: 98.4 F (36.9 C) 97.5 F (36.4 C)  98 F (36.7 C)  TempSrc: Oral Oral  Oral  Resp: 20 20  18   Height:      Weight:      SpO2: 100% 100%  100%    Intake/Output Summary (Last 24 hours) at 09/23/14 1047 Last data filed at 09/23/14 0914  Gross per 24 hour  Intake    600 ml  Output      0 ml  Net    600 ml    Exam:  General: Alert and awake, NAD, tracks with her eyes, aphasia  CVS: S1-S2 clear, no murmur rubs or gallops  Chest: clear to auscultation bilaterally, no wheezing, rales or rhonchi  Abdomen: soft nontender, nondistended, normal bowel sounds  Extremities: no cyanosis, clubbing or edema noted bilaterally  Neuro: Right-sided hemiparesis, left UE and LLE 4+/5       Data Reviewed: Basic Metabolic Panel:  Recent Labs Lab 09/19/14 1004 09/20/14 1525 09/20/14 2155 09/21/14 0347 09/22/14 0348 09/23/14 0529  NA 140 141  --  143 146 146  K 4.1 3.6*  --  4.2 3.9 3.7  CL 103 103  --  105 107 108  CO2 25 24  --  23 24 24  GLUCOSE 117* 94  --  120* 162* 197*  BUN 26* 25*  --  29* 39* 52*  CREATININE 1.54* 1.59*  --  1.71* 1.76* 1.72*  CALCIUM 9.0 8.8  --  9.0 9.5 9.6  MG  --  2.2 2.2 2.3  --   --     Liver Function Tests:  Recent Labs Lab 09/18/14 1558 09/20/14 1525 09/21/14 0347 09/22/14 0348 09/23/14 0529  AST 13 28 51* 25 12  ALT 20 29 60* 62* 44*  ALKPHOS 39 41 44 47 46  BILITOT 0.5 0.7 0.8 0.7 0.5  PROT 6.8 6.2 6.7 6.9 6.7  ALBUMIN 2.8* 2.3* 2.4* 2.5* 2.6*   No results found for this basename: LIPASE, AMYLASE,  in the last 168 hours No results found for this basename: AMMONIA,  in the last 168 hours  CBC:  Recent Labs Lab 09/18/14 1558 09/19/14 1004 09/20/14 1525 09/21/14 0347 09/22/14 0348  WBC 16.6* 12.6* 10.7* 11.1* 9.6  NEUTROABS 14.0*  --  8.1* 8.8*  --   HGB 11.5* 10.6* 10.0*  10.9* 10.6*  HCT 31.6* 29.4* 28.7* 31.1* 30.0*  MCV 86.6 86.5 88.3 88.6 88.8  PLT 42* 30* 39* 54* 100*    Cardiac Enzymes:  Recent Labs Lab 09/19/14 0455 09/19/14 1004 09/20/14 2155 09/21/14 0347 09/21/14 1142  TROPONINI 0.33* 0.32* <0.30 <0.30 <0.30   BNP (last 3 results)  Recent Labs  09/19/14 1004  PROBNP 1843.0*     CBG:  Recent Labs Lab 09/22/14 0730 09/22/14 1224 09/22/14 1830 09/22/14 2328 09/23/14 0634  GLUCAP 154* 166* 184* 176* 180*    Recent Results (from the past 240 hour(s))  MRSA PCR SCREENING     Status: None   Collection Time    09/18/14  6:13 PM      Result Value Ref Range Status   MRSA by PCR NEGATIVE  NEGATIVE Final   Comment:            The GeneXpert MRSA Assay (FDA     approved for NASAL specimens     only), is one component of a     comprehensive MRSA colonization     surveillance program. It is not     intended to diagnose MRSA     infection nor to guide or     monitor treatment for     MRSA infections.  CULTURE, RESPIRATORY (NON-EXPECTORATED)     Status: None   Collection Time    09/20/14  8:42 PM      Result Value Ref Range Status   Specimen Description TRACHEAL ASPIRATE   Final   Special Requests Normal   Final   Gram Stain     Final   Value: FEW WBC PRESENT,BOTH PMN AND MONONUCLEAR     FEW SQUAMOUS EPITHELIAL CELLS PRESENT     MODERATE GRAM NEGATIVE RODS     FEW GRAM POSITIVE COCCI     IN PAIRS   Culture     Final   Value: Non-Pathogenic Oropharyngeal-type Flora Isolated.     Performed at [facility]   Report Status 09/23/2014 FINAL   Final  CULTURE, BLOOD (ROUTINE X 2)     Status: None   Collection Time    09/20/14  9:55 PM      Result Value Ref Range Status   Specimen Description BLOOD RIGHT ARM   Final   Special Requests     Final   Value: BOTTLES DRAWN AEROBIC AND ANAEROBIC 10CC AEROBIC 5CC  ANAEROBIC   Culture  Setup Time     Final   Value: 09/21/2014 03:28     Performed at [facility]    Culture     Final   Value:        BLOOD CULTURE RECEIVED NO GROWTH TO DATE CULTURE WILL BE HELD FOR 5 DAYS BEFORE ISSUING A FINAL NEGATIVE REPORT     Performed at [facility]   Report Status PENDING   Incomplete  CULTURE, BLOOD (ROUTINE X 2)     Status: None   Collection Time    09/20/14 10:05 PM      Result Value Ref Range Status   Specimen Description BLOOD RIGHT HAND   Final   Special Requests BOTTLES DRAWN AEROBIC ONLY 10CC   Final   Culture  Setup Time     Final   Value: 09/21/2014 03:29     Performed at [facility]   Culture     Final   Value:        BLOOD CULTURE RECEIVED NO GROWTH TO DATE CULTURE WILL BE HELD FOR 5 DAYS BEFORE ISSUING A FINAL NEGATIVE REPORT     Performed at [facility]   Report Status PENDING   Incomplete     Studies: Ct Abdomen Pelvis Wo Contrast  09/21/2014   CLINICAL DATA:  Evaluate for septic emboli. History of renal cell cancer, multiple myeloma.  EXAM: CT CHEST, ABDOMEN AND PELVIS WITHOUT CONTRAST  TECHNIQUE: Multidetector CT imaging of the chest, abdomen and pelvis was performed following the standard protocol without IV contrast.  COMPARISON:  CT 05/11/2014 and 04/20/2014.  FINDINGS: CT CHEST FINDINGS  Thoracic aorta normal caliber. Atherosclerotic vascular changes noted stable cardiomegaly. Coronary artery disease.  Shotty mediastinal lymph nodes.  Thoracic esophagus is unremarkable.  Endobronchial lesion is noted in the right mainstem bronchus, most likely mucous plugging. Similar findings noted in the trachea to a lesser degree. New also severe bilateral pulmonary infiltrates most consistent with pneumonia. Pulmonary edema could present this fashion. Tiny pleural effusions. No pneumothorax.  Calcified right thyroid lobe mass again noted. Chest wall is intact.  CT ABDOMEN AND PELVIS FINDINGS  Liver normal. Spleen normal. Pancreas normal. No biliary distention. The gallbladder is nondistended.  Right nephrectomy. Right surgical bed is  unremarkable. No mass lesion. Left kidney is unremarkable. No hydronephrosis or obstructing ureteral stone. Foley catheter is noted in the bladder. Air is noted bladder pelvis is most likely from instrumentation. Uterus and adnexa are unremarkable.  No significant adenopathy. Aortoiliac atherosclerotic vascular disease.  Appendiceal region is normal. There is no bowel distention. No free air. No mesenteric mass. Mild subcutaneous edema noted over the lower flanks. Developing mild anasarca could present in this fashion. Correlation to exclude cellulitis or developing hematoma suggested.  Diffuse severe thoracolumbar spine degenerative change. Sclerotic changes about the SI joints are stable.  IMPRESSION: 1. New severe bilateral pulmonary infiltrates most consistent with pneumonia. Pulmonary edema could present in this fashion. 2. Endotracheal and right endobronchial soft tissue densities, most likely mucous plugging. Possibility of a and bronchial primary or metastatic lesion cannot be excluded. 3. Right nephrectomy. Nephrectomy bed appears stable. No recurrent mass. 4. Mild subcutaneous edema noted over the lower flanks. Developing obstructive could present in this fashion. Clinical correlation is is suggested to exclude developing cellulitis or developing hematomas in these regions.   Electronically Signed   By: Maisie Fus  Register   On: 09/20/2014 21:55   Ct Head Wo Contrast  09/18/2014  CLINICAL DATA:  Code stroke, altered level of consciousness  EXAM: CT HEAD WITHOUT CONTRAST  TECHNIQUE: Contiguous axial images were obtained from the base of the skull through the vertex without intravenous contrast.  COMPARISON:  None  FINDINGS: Cavum septum pellucidum and vergae.  Otherwise normal ventricular morphology.  No midline shift.  Old high RIGHT frontal infarct.  Large acute LEFT MCA territory infarct involving the LEFT temporal and parietal lobes with effacement of sulci.  No definite dense middle cerebral artery  sign.  No intracranial hemorrhage, mass lesion or extra-axial fluid collections.  Low-attenuation at posterior aspect of LEFT cerebellar hemisphere may represent a small old infarct as well.  Minimal small vessel chronic ischemic changes of deep cerebral white matter.  No acute bone or sinus findings.  Bony excrescences are identified at the sphenoid wings bilaterally potentially developmental variants or exostoses though difficult to completely exclude calcified meningiomas, considered much less likely.  IMPRESSION: Large LEFT MCA territory infarct involving LEFT temporal and parietal lobes.  No acute intracranial hemorrhage or midline shift.  Old small high RIGHT frontal and questionable LEFT cerebellar infarcts.  Critical Value/emergent results were called by telephone at the time of interpretation on 09/18/2014 at 1556 hr to Dr. Donnetta Hutching, who verbally acknowledged these results.   Electronically Signed   By: Ulyses Southward M.D.   On: 09/18/2014 15:58   Ct Chest Wo Contrast  09/20/2014   CLINICAL DATA:  Evaluate for septic emboli. History of renal cell cancer, multiple myeloma.  EXAM: CT CHEST, ABDOMEN AND PELVIS WITHOUT CONTRAST  TECHNIQUE: Multidetector CT imaging of the chest, abdomen and pelvis was performed following the standard protocol without IV contrast.  COMPARISON:  CT 05/11/2014 and 04/20/2014.  FINDINGS: CT CHEST FINDINGS  Thoracic aorta normal caliber. Atherosclerotic vascular changes noted stable cardiomegaly. Coronary artery disease.  Shotty mediastinal lymph nodes.  Thoracic esophagus is unremarkable.  Endobronchial lesion is noted in the right mainstem bronchus, most likely mucous plugging. Similar findings noted in the trachea to a lesser degree. New also severe bilateral pulmonary infiltrates most consistent with pneumonia. Pulmonary edema could present this fashion. Tiny pleural effusions. No pneumothorax.  Calcified right thyroid lobe mass again noted. Chest wall is intact.  CT ABDOMEN AND  PELVIS FINDINGS  Liver normal. Spleen normal. Pancreas normal. No biliary distention. The gallbladder is nondistended.  Right nephrectomy. Right surgical bed is unremarkable. No mass lesion. Left kidney is unremarkable. No hydronephrosis or obstructing ureteral stone. Foley catheter is noted in the bladder. Air is noted bladder pelvis is most likely from instrumentation. Uterus and adnexa are unremarkable.  No significant adenopathy. Aortoiliac atherosclerotic vascular disease.  Appendiceal region is normal. There is no bowel distention. No free air. No mesenteric mass. Mild subcutaneous edema noted over the lower flanks. Developing mild anasarca could present in this fashion. Correlation to exclude cellulitis or developing hematoma suggested.  Diffuse severe thoracolumbar spine degenerative change. Sclerotic changes about the SI joints are stable.  IMPRESSION: 1. New severe bilateral pulmonary infiltrates most consistent with pneumonia. Pulmonary edema could present in this fashion. 2. Endotracheal and right endobronchial soft tissue densities, most likely mucous plugging. Possibility of a and bronchial primary or metastatic lesion cannot be excluded. 3. Right nephrectomy. Nephrectomy bed appears stable. No recurrent mass. 4. Mild subcutaneous edema noted over the lower flanks. Developing obstructive could present in this fashion. Clinical correlation is is suggested to exclude developing cellulitis or developing hematomas in these regions.   Electronically Signed   By: Maisie Fus  Register   On: 09/20/2014 21:55   Mr Maxine Glenn Head Wo Contrast  09/19/2014   ADDENDUM REPORT: 09/19/2014 10:07  ADDENDUM: Study discussed by telephone with Dr. Kerry Hough On 09/19/2014 at 0958 hrs.   Electronically Signed   By: Augusto Gamble M.D.   On: 09/19/2014 10:07   09/19/2014   CLINICAL DATA:  68 year old female with left MCA infarct detected by CT presenting with code stroke, altered mental status. Initial encounter.  EXAM: MRI HEAD WITHOUT  CONTRAST  MRA HEAD WITHOUT CONTRAST  TECHNIQUE: Multiplanar, multiecho pulse sequences of the brain and surrounding structures were obtained without intravenous contrast. Angiographic images of the head were obtained using MRA technique without contrast.  COMPARISON:  Head CT without contrast 09/18/2014.  FINDINGS: MRI HEAD FINDINGS  Large confluent and intensely restricted diffusion in the left hemisphere extending from the insula and operculum posteriorly, involving most of the left occipital lobe as well as the posterior temporal lobe. Small nodular areas of restricted diffusion in the left basal ganglia, which otherwise are spared. Superimposed multiple bilateral punctate and nodular foci of restricted diffusion, including involvement of the right MCA and PCA territories. Furthermore there are multiple nodular foci restricted diffusion in both cerebellar hemispheres. See series 100.  Cytotoxic edema in the affected territories. Still, intracranial mass effect is Mild at this time, with Trace rightward midline shift. No definite petechial hemorrhage.  Major intracranial vascular flow voids are preserved.  Cavum septum pellucidum. Mass effect on the left lateral ventricle. No ventriculomegaly. Basilar cisterns remain patent. Negative pituitary, cervicomedullary junction, and grossly negative visualized cervical spine. Chronic lacunar infarcts superimposed in the right corona radiata and right superior frontal gyrus subcortical white matter (with small focus of cortical encephalomalacia).  Visualized orbit soft tissues are within normal limits. Visualized paranasal sinuses and mastoids are clear. Normal bone marrow signal. No acute scalp soft tissue findings.  MRA HEAD FINDINGS  Antegrade flow in the posterior circulation with codominant distal vertebral arteries. Patent vertebrobasilar junction. Right PICA origin is patent. Left PICA less well visualized. Basilar artery irregularity without hemodynamically  significant stenosis. Both PCA origins are patent. The right posterior communicating artery is present, the left is diminutive or absent. Bilateral PCA branches are within normal limits.  Antegrade flow in both ICA siphons. Bilateral siphon irregularity is moderate. No definite hemodynamically significant ICA siphon stenosis. Patent carotid termini. Normal right MCA and bilateral ACA origins.  The left MCA is occluded in the M1 segment 10 mm beyond its origin. Paucity of flow signal only in the left MCA anterior division M2 branches.  Anterior communicating artery diminutive or absent. Visualized bilateral ACA branches are within normal limits. Right MCA M1 segment and bifurcation are patent with mild to moderate irregularity. No definite M2 branch occlusion on the right.  IMPRESSION: 1. Large acute left MCA infarct. Cytotoxic edema and mass effect but no associated hemorrhage at this time. 2. Numerous scattered small acute infarcts in the bilateral MCA, right PCA, and bilateral PICA territories. Constellation most compatible with recent embolic event (cardiac or proximal aortic source). 3. Left MCA occlusion in the M1 segment. Some reconstituted flow only in the anterior left MCA division. 4. No other major circle of Willis branch occlusion. Moderate irregularity of both ICA siphons.  Electronically Signed: By: Augusto Gamble M.D. On: 09/19/2014 09:44   Mr Brain Wo Contrast  09/19/2014   ADDENDUM REPORT: 09/19/2014 10:07  ADDENDUM: Study discussed by telephone with Dr. Kerry Hough On 09/19/2014 at 0958 hrs.   Electronically Signed  By: Augusto Gamble M.D.   On: 09/19/2014 10:07   09/19/2014   CLINICAL DATA:  68 year old female with left MCA infarct detected by CT presenting with code stroke, altered mental status. Initial encounter.  EXAM: MRI HEAD WITHOUT CONTRAST  MRA HEAD WITHOUT CONTRAST  TECHNIQUE: Multiplanar, multiecho pulse sequences of the brain and surrounding structures were obtained without intravenous contrast.  Angiographic images of the head were obtained using MRA technique without contrast.  COMPARISON:  Head CT without contrast 09/18/2014.  FINDINGS: MRI HEAD FINDINGS  Large confluent and intensely restricted diffusion in the left hemisphere extending from the insula and operculum posteriorly, involving most of the left occipital lobe as well as the posterior temporal lobe. Small nodular areas of restricted diffusion in the left basal ganglia, which otherwise are spared. Superimposed multiple bilateral punctate and nodular foci of restricted diffusion, including involvement of the right MCA and PCA territories. Furthermore there are multiple nodular foci restricted diffusion in both cerebellar hemispheres. See series 100.  Cytotoxic edema in the affected territories. Still, intracranial mass effect is Mild at this time, with Trace rightward midline shift. No definite petechial hemorrhage.  Major intracranial vascular flow voids are preserved.  Cavum septum pellucidum. Mass effect on the left lateral ventricle. No ventriculomegaly. Basilar cisterns remain patent. Negative pituitary, cervicomedullary junction, and grossly negative visualized cervical spine. Chronic lacunar infarcts superimposed in the right corona radiata and right superior frontal gyrus subcortical white matter (with small focus of cortical encephalomalacia).  Visualized orbit soft tissues are within normal limits. Visualized paranasal sinuses and mastoids are clear. Normal bone marrow signal. No acute scalp soft tissue findings.  MRA HEAD FINDINGS  Antegrade flow in the posterior circulation with codominant distal vertebral arteries. Patent vertebrobasilar junction. Right PICA origin is patent. Left PICA less well visualized. Basilar artery irregularity without hemodynamically significant stenosis. Both PCA origins are patent. The right posterior communicating artery is present, the left is diminutive or absent. Bilateral PCA branches are within normal  limits.  Antegrade flow in both ICA siphons. Bilateral siphon irregularity is moderate. No definite hemodynamically significant ICA siphon stenosis. Patent carotid termini. Normal right MCA and bilateral ACA origins.  The left MCA is occluded in the M1 segment 10 mm beyond its origin. Paucity of flow signal only in the left MCA anterior division M2 branches.  Anterior communicating artery diminutive or absent. Visualized bilateral ACA branches are within normal limits. Right MCA M1 segment and bifurcation are patent with mild to moderate irregularity. No definite M2 branch occlusion on the right.  IMPRESSION: 1. Large acute left MCA infarct. Cytotoxic edema and mass effect but no associated hemorrhage at this time. 2. Numerous scattered small acute infarcts in the bilateral MCA, right PCA, and bilateral PICA territories. Constellation most compatible with recent embolic event (cardiac or proximal aortic source). 3. Left MCA occlusion in the M1 segment. Some reconstituted flow only in the anterior left MCA division. 4. No other major circle of Willis branch occlusion. Moderate irregularity of both ICA siphons.  Electronically Signed: By: Augusto Gamble M.D. On: 09/19/2014 09:44   US Soft Tissue Head/neck  08/31/2014   CLINICAL DATA:  thyroid nodule. FNA biopsy of dominant right nodule 09/07/2013.  EXAM: THYROID ULTRASOUND  TECHNIQUE: Ultrasound examination of the thyroid gland and adjacent soft tissues was performed.  COMPARISON:  08/04/2013  FINDINGS: Right thyroid lobe  Measurements: 50 x 23 x 32 mm. Dominant 36 x 20 x 25 mm complex solid nodule, mid lobe (previously 19 x 26  x 29). Adjacent 9 x 6 mm complex cyst at its inferior margin (previously 6 x 8 x 9).  Left thyroid lobe  Measurements: 48 x 18 x 27 mm. Markedly inhomogeneous background echotexture. Poorly marginated 14 x 7 x 10 mm hypoechoic lesion, mid lobe (previously 7 x 9 x 8). 8 x 6 x 9 mm complex cyst, inferior pole.  Isthmus  Thickness: 3 mm.  No nodules  visualized.  Lymphadenopathy  None visualized.  IMPRESSION: 1. Thyromegaly with bilateral nodules. Enlargement of dominant right lesion; correlate with previous biopsy results. No other lesion meets current consensus criteria for biopsy. Follow-up by clinical exam is recommended. If patient has known risk factors for thyroid carcinoma, consider follow-up ultrasound in 12 months. If patient is clinically hyperthyroid, consider nuclear medicine thyroid uptake and scan. This recommendation follows the consensus statement: Management of Thyroid Nodules Detected as [place]: Society of Radiologists in Ultrasound Consensus Conference Statement. Radiology 2005; [id number].   Electronically Signed   By: Oley Balm M.D.   On: 08/31/2014 14:30   Dg Chest Port 1 View  09/21/2014   CLINICAL DATA:  68 year old female with shortness of breath. Left MCA infarct, aspiration pneumonia.  EXAM: PORTABLE CHEST - 1 VIEW  COMPARISON:  09/18/2014 and prior chest radiographs dating back to 05/08/2000  FINDINGS: Cardiomegaly again noted.  Elevation of the right hemidiaphragm and bibasilar atelectasis again noted.  Pulmonary vascular congestion again identified.  There has been interval improvement in left lung aeration.  There is no evidence of pneumothorax.  No other significant change identified.  IMPRESSION: Improved left lung aeration without other significant change.   Electronically Signed   By: Laveda Abbe M.D.   On: 09/21/2014 08:15   Dg Chest Portable 1 View  09/18/2014   CLINICAL DATA:  Stroke.  EXAM: PORTABLE CHEST - 1 VIEW  COMPARISON:  Chest CT 05/11/2014  FINDINGS: Single view of the chest demonstrates low lung volumes. Patchy airspace densities in the left lung. There may be subtle airspace disease in the right lung. Heart size is accentuated by the low lung volumes.  IMPRESSION: Patchy airspace disease, particularly in the left lung. Findings are concerning for pneumonia or aspiration. Asymmetric pulmonary edema is also  in the differential diagnosis.   Electronically Signed   By: Richarda Overlie M.D.   On: 09/18/2014 17:10    Scheduled Meds: . aspirin  300 mg Rectal Daily   Or  . aspirin  325 mg Oral Daily  . clindamycin (CLEOCIN) IV  600 mg Intravenous 3 times per day  . fluticasone  2 spray Each Nare Daily  . ipratropium-albuterol  3 mL Nebulization QID  . levothyroxine  37.5 mcg Intravenous Daily  . methylPREDNISolone (SOLU-MEDROL) injection  60 mg Intravenous Q12H  . pantoprazole (PROTONIX) IV  40 mg Intravenous Q12H   Continuous Infusions: . sodium chloride 75 mL/hr at 09/22/14 1800  . sodium chloride 20 mL/hr at 09/23/14 0050  . sodium chloride 150 mL/hr at 09/23/14 9528    Principal Problem:   CVA (cerebral infarction) Active Problems:   Aspiration pneumonia   Thrombocytopenia, unspecified   Elevated troponin   CKD (chronic kidney disease) stage 3, GFR 30-59 ml/min    Time spent: 40 minutes   [facility]  [facility] Pager [phone]. If 7PM-7AM, please contact night-coverage at [email or web address], password [facility] 09/23/2014, 10:47 AM  LOS: 5 days

## 2014-09-23 NOTE — Progress Notes (Signed)
SLP Cancellation Note  Patient Details Name: Melissa [name] MRN: [id number] DOB: 1946/04/09   Cancelled treatment:       Reason Eval/Treat Not Completed: Other (comment). Pt to have TEE today at noon. Will check back around 3pm for readiness for POs.    Veida Spira, Katherene Ponto 09/23/2014, 10:33 AM

## 2014-09-23 NOTE — Progress Notes (Signed)
Speech Language Pathology Treatment: Dysphagia;Cognitive-Linquistic  Patient Details Name: Melissa [name] MRN: [id number] DOB: 03-13-1946 Today's Date: 09/23/2014 Time: 1275-1700 SLP Time Calculation (min): 26 min  Assessment / Plan / Recommendation Clinical Impression  Pt continues to make gains.  Communication marked by increased attempts to verbalize; facial expressions in response to questions; spontaneous phonation.  Continues to have difficulty f/c; total assist for automatic productions with tactile/verbal/visual cues provided.  Swallowing also shows improvements, but not sufficient to begin PO diet.  Demonstrates improved oral recognition and effort to manipulate POs.  Persists with oral delays and impaired sensation on right; likely pharyngeal delay with improved consistency of laryngeal elevation, but is weak on palpation and there are concerns for silent aspiration given sensory loss.  Daughter present; we discussed improvements last several days and plan for upcoming weekend: 1) recommend to proceed with plan for temporary enteral feeds, given degree of dysphagia still present; 2) SLP to f/u Monday, 10/5 to determine readiness for instrumental swallow study and continue aphasia therapy.  Daughter in agreement.    HPI HPI: 68 y.o. admitted with AMS and found down. PMH: obesity, hypothyroidism, hypertension, hyperlipidemia, history of kidney cancer status post right nephrectomy, pulmonary fibrosis, multiple myeloma currently undergoing chemotherapy. Found to have right sided weakness and facial droop and was not verbalizing.  MRI large acute left MCA infarct and numerous scattered small acute infarcts in the bilateral MCA, right PCA, and bilateral PICA territories.  CXR patchy airspace disease, particularly in the left lung. Findings are concerning for pneumonia or aspiration.   Pertinent Vitals Pain Assessment: Faces Faces Pain Scale: No hurt  SLP Plan  Continue with current plan of care     Recommendations Diet recommendations: Pudding-thick liquid              Oral Care Recommendations:  (QD) Follow up Recommendations: Inpatient Rehab Plan: Continue with current plan of care   Melissa [name], [place] CCC/SLP Pager [phone]      Melissa [name] 09/23/2014, 4:57 PM

## 2014-09-23 NOTE — Op Note (Signed)
INDICATIONS: stroke  PROCEDURE:   Informed consent was obtained prior to the procedure. The risks, benefits and alternatives for the procedure were discussed and the patient comprehended these risks.  Risks include, but are not limited to, cough, sore throat, vomiting, nausea, somnolence, esophageal and stomach trauma or perforation, bleeding, low blood pressure, aspiration, pneumonia, infection, trauma to the teeth and death.    After a procedural time-out, the oropharynx was anesthetized with 20% benzocaine spray. The patient was given 3 mg versed and 25 mcg fentanyl for moderate sedation.   The transesophageal probe was inserted in the esophagus and stomach without difficulty and multiple views were obtained.  The patient was kept under observation until the patient left the procedure room.  The patient left the procedure room in stable condition.   Agitated microbubble saline contrast was administered.  COMPLICATIONS:    There were no immediate complications.  FINDINGS:  Intermittent large right to left shunt across a PFO, during quiet respiration - possible pathway for paradoxical embolism  RECOMMENDATIONS:    Check for DVT of legs. Consider PFO closure.  Time Spent Directly with the Patient:  45 minutes   Kennedy Brines 09/23/2014, 12:11 PM

## 2014-09-23 NOTE — Progress Notes (Signed)
STROKE TEAM PROGRESS NOTE   HISTORY Melissa [name] is an 68 y.o. female with a past medical history significant for HTN, hyperlipidemia, renal cell carcinoma s/p right nephrectomy, multiple myeloma on chemotherapy, transferred to [facility] for further stroke management. Patient has altered mental status and seems to be aphasic, family is unavailable, and thus all clinical information was obtained from the chart. She was initially evaluated at [facility] where notes indicated that " she was brought to the hospital by EMS due to altered mental status. The patient was found by her daughter at approximately 2 PM. She had fallen on the ground at an unknown time and was found on the floor by her daughter. She was altered at that point and had weakness of her right side including right facial droop and was not verbalizing. She was last known to be well last night. Code stroke was called alone neurology determined not to proceed with TPA due to unknown time of onset". At time of consult, she is lethargic but open eyes on verbal commands. On aspirin. last known well unknown.   MRI/MRA brain" large acute left MCA infarct. Cytotoxic edema and mass effect but no associated hemorrhage at this time. Numerous scattered small acute infarcts in the bilateral MCA, right PCA, and bilateral PICA territories.  Constellation most compatible with recent embolic event (cardiac or proximal aortic source). Left MCA occlusion in the M1 segment. Some reconstituted flow only in the anterior left MCA division.   Patient was not administered TPA secondary to delay in arrival. She was admitted to a step down unit for further evaluation and treatment.   SUBJECTIVE (INTERVAL HISTORY) Pt is more awake alert than yesterday but still global aphasia and right hemiplegia. TEE done today showed PFO. Pt had PANDA tube insertion for tube feeding.   OBJECTIVE Temp:  [97.5 F (36.4 C)-98.4 F (36.9 C)] 98.3 F (36.8 C) (10/02 1808) Pulse Rate:   [70-100] 80 (10/02 1808) Cardiac Rhythm:  [-] Normal sinus rhythm (10/02 0002) Resp:  [13-24] 18 (10/02 1808) BP: (152-216)/(52-98) 170/65 mmHg (10/02 1808) SpO2:  [97 %-100 %] 100 % (10/02 1944)   Recent Labs Lab 09/22/14 0730 09/22/14 1224 09/22/14 1830 09/22/14 2328 09/23/14 0634  GLUCAP 154* 166* 184* 176* 180*    Recent Labs Lab 09/19/14 1004 09/20/14 1525 09/20/14 2155 09/21/14 0347 09/22/14 0348 09/23/14 0529  NA 140 141  --  143 146 146  K 4.1 3.6*  --  4.2 3.9 3.7  CL 103 103  --  105 107 108  CO2 25 24  --  _0 GLUCOSE 117* 94  --  120* 162* 197*  BUN 26* 25*  --  29* 39* 52*  CREATININE 1.54* 1.59*  --  1.71* 1.76* 1.72*  CALCIUM 9.0 8.8  --  9.0 9.5 9.6  MG  --  2.2 2.2 2.3  --   --     Recent Labs Lab 09/18/14 1558 09/20/14 1525 09/21/14 0347 09/22/14 0348 09/23/14 0529  AST 13 28 51* 25 12  ALT 20 29 60* 62* 44*  ALKPHOS 39 41 44 47 46  BILITOT 0.5 0.7 0.8 0.7 0.5  PROT 6.8 6.2 6.7 6.9 6.7  ALBUMIN 2.8* 2.3* 2.4* 2.5* 2.6*    Recent Labs Lab 09/18/14 1558 09/19/14 1004 09/20/14 1525 09/21/14 0347 09/22/14 0348  WBC 16.6* 12.6* 10.7* 11.1* 9.6  NEUTROABS 14.0*  --  8.1* 8.8*  --   HGB 11.5* 10.6* 10.0* 10.9* 10.6*  HCT 31.6*  29.4* 28.7* 31.1* 30.0*  MCV 86.6 86.5 88.3 88.6 88.8  PLT 42* 30* 39* 54* 100*    Recent Labs Lab 09/19/14 0455 09/19/14 1004 09/20/14 2155 09/21/14 0347 09/21/14 1142  TROPONINI 0.33* 0.32* <0.30 <0.30 <0.30   No results found for this basename: LABPROT, INR,  in the last 72 hours No results found for this basename: COLORURINE, APPERANCEUR, LABSPEC, PHURINE, GLUCOSEU, HGBUR, BILIRUBINUR, KETONESUR, PROTEINUR, UROBILINOGEN, NITRITE, LEUKOCYTESUR,  in the last 72 hours     Component Value Date/Time   CHOL 171 09/19/2014 0455   TRIG 182* 09/19/2014 0455   HDL 79 09/19/2014 0455   CHOLHDL 2.2 09/19/2014 0455   VLDL 36 09/19/2014 0455   LDLCALC 56 09/19/2014 0455   Lab Results  Component Value Date    HGBA1C 6.4* 09/19/2014   No results found for this basename: labopia,  cocainscrnur,  labbenz,  amphetmu,  thcu,  labbarb    No results found for this basename: ETH,  in the last 168 hours  Ct Head Wo Contrast 09/18/2014   Large LEFT MCA territory infarct involving LEFT temporal and parietal lobes.  No acute intracranial hemorrhage or midline shift.  Old small high RIGHT frontal and questionable LEFT cerebellar infarcts.    Mr Brain Wo Contrast 09/19/2014   1. Large acute left MCA infarct. Cytotoxic edema and mass effect but no associated hemorrhage at this time. 2. Numerous scattered small acute infarcts in the bilateral MCA, right PCA, and bilateral PICA territories. Constellation most compatible with recent embolic event (cardiac or proximal aortic source).   Mr Jodene Nam Head Wo Contrast 09/19/2014   3. Left MCA occlusion in the M1 segment. Some reconstituted flow only in the anterior left MCA division. 4. No other major circle of Willis branch occlusion. Moderate irregularity of both ICA siphons.    Dg Chest Portable 1 View 09/18/2014    Patchy airspace disease, particularly in the left lung. Findings are concerning for pneumonia or aspiration. Asymmetric pulmonary edema is also in the differential diagnosis.     2D Echocardiogram  - Mild LVH with LVEF 70-48%, grade 1 diastolic dysfunction. Mild mitral regurgitation. Mildly dilated RV with normal contraction. Trivial tricuspid regurgitation, PASP 52 mm mercury. No definitive PFO or ASD. No pericardial effusion. No source of embolus.   Carotid Doppler  No evidence of hemodynamically significant internal carotid artery stenosis - 1-39% internal carotid artery stenosis bilaterally. Vertebral artery flow is antegrade.   Ct Abdomen Pelvis Chest Wo Contrast 09/21/2014    1. New severe bilateral pulmonary infiltrates most consistent with pneumonia. Pulmonary edema could present in this fashion. 2. Endotracheal and right endobronchial soft tissue  densities, most likely mucous plugging. Possibility of a and bronchial primary or metastatic lesion cannot be excluded. 3. Right nephrectomy. Nephrectomy bed appears stable. No recurrent mass. 4. Mild subcutaneous edema noted over the lower flanks. Developing obstructive could present in this fashion. Clinical correlation is is suggested to exclude developing cellulitis or developing hematomas in these regions.     Venous doppler LEs - - No evidence of deep vein or superficial thrombosis involving the right lower extremity and left lower extremity. - No evidence of Baker&'s cyst on the right or left.   PHYSICAL EXAM Temp:  [97.5 F (36.4 C)-98.4 F (36.9 C)] 98.3 F (36.8 C) (10/02 1808) Pulse Rate:  [70-100] 80 (10/02 1808) Resp:  [13-24] 18 (10/02 1808) BP: (152-216)/(52-98) 170/65 mmHg (10/02 1808) SpO2:  [97 %-100 %] 100 % (10/02 1944)  General - obese,  well developed, not in acute distress.  Ophthalmologic - not able to see through.  Cardiovascular - Regular rate and rhythm with no murmur.  Mental Status -  Awake, alert, but global aphasia, not following commands.  Cranial Nerves II - XII - II - Visual neglect on the right side. III, IV, VI - Extraocular movements showed left side eye gaze V - Facial sensation not able to test. VII - right facial droop. VIII - not able to test but track for voice. X - not cooperative. XI - not cooperative XII - Tongue in middle inside mouth.  Motor Strength - The patient's strength was hemiplegic on the right. RLE withdraw to pain but LUE and LLE spontaneous movement.  Bulk was normal and fasciculations were absent.   Motor Tone - Muscle tone was assessed at the neck and appendages and was normal.  Reflexes - The patient's reflexes were normal in all extremities and she had bilateral babinski.  Sensory - respond to pain stimulation.    Coordination - not able to test.  Gait and Station - not tested.   ASSESSMENT/PLAN [name] is a 68 y.o. female with history of hypertension, hyperlipidemia, thyroid disease with increased size (bx held given dx multiple myeloma), renal cell cancer s/p nephrectomy and no recurrent at surgical bed, and recently diagnosed multiple myeloma on chemo presenting with sudden onset right hemiparesis. She did not receive IV t-PA due to delay in arrival. MRI imaging confirms a left MCA infarct, as well as numerous scattered small acute infarcts in the bilateral MCA, right PCA, and bilateral PICA territories, consistent with embolic most likely secondary to either cardioembolic or hypercoagulable state from malignancy or chemo drug - induced (Revlimid).   Stroke:  Large left MCA infarct in setting of L MCA M1 occlusion, and numerous scattered small acute infarcts in the bilateral MCA, right PCA, and bilateral PICA territories, Consistent with embolic most likely secondary to either cardioembolic or hypercoagulable state from malignancy or chemo meds.    MRI  Large lefe MCA, but also Numerous scattered small acute infarcts in the bilateral MCA, right PCA, and bilateral PICA territories   MRA  L MCA M1 occlusion  Carotid Doppler  No significant stenosis   2D Echo  No source of embolus  TEE showed PFO but no other cardiac source of emboli, on antiplatelet.  LE venous doppler - negative   If no afib found on tele on discharge, may need to consider loop recorder placement.  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  presented with LBP and concerning for MM metastasis to spine??  thyroid nodule enlargement as per family, bx on hold given acute cancer treatment  CT chest R lung mucous plugging vs other etiology  Dr. Alla German - on Revlimid, known to have risk of PNA and stroke   Hypertension   Permissive hypertension <220/120 for 24-48 hours and then gradually normalize within 5-7 days  BP goal long  term normotensive  Norvasc, lasix prior to admission BP 133-166/50-60 past 24h (09/23/2014 @ 8:29 PM)  Stable  Hyperlipidemia  Home meds:  mevacor 40, not resumed in hospital due to NPO status  LDL 56, on the goal <70 for diabetics  Continue statin at discharge  Other Stroke Risk Factors Advanced age   Morbid obesity, Body mass index is 39.55 kg/(m^2).   Other Active Problems  Aspiration pnemonia, placed on clindamycin  Thrombocythemia, due to chemo   Elevated troponin, thought to be due to large stroke. Cardiology consulted.  Hypothyroid, enlarged lobe, bx on hold given acute cancer treatment  Depression, on treatment  COPD, on treatment  CHF, pulmonary hypertension   Hypokalemia, resolved. K 3.9  hypothyroid  Other Pertinent History  CKD  Allergic rhinitis  Hospital day # 5  Rosalin Hawking, MD PhD Stroke Neurology 09/23/2014 8:29 PM   To contact Stroke Continuity provider, please refer to [email or web address] After hours, contact General Neurology

## 2014-09-23 NOTE — Progress Notes (Signed)
ELECTROPHYSIOLOGY CONSULT NOTE  Patient ID: Melissa [name], MRN: [id number], DOB/AGE: [birth year] 68 y.o. Admit date: 09/18/2014 Date of Consult: 09/23/2014  Primary Physician: [name], MD Primary Cardiologist: new  Chief Complaint: stroke   HPI Melissa [name] is a 68 y.o. female  With recent stroke in context of chemo Rxc for MM, and prior Hx of Renal Cell CA with prior nephrectomy, Rcently, completed first cycle of therapy including Revlimid known to significantly increase risk of thromboembolism (to include CVA and even MI)   MRI  >>large MCA stroke and small scattered infarcts  Residual RHParesis and aphasia   Echo normal LV function with some RV enlargement and hypokinesis with eleveated pulm pressures     Past Medical History  Diagnosis Date  . Vertigo, intermittent   . Hypothyroidism   . Hyperlipidemia   . Obesity   . Hypertension     severe and resistant to treatment; 2008-negative left renal angiogram  . Vitiligo   . Degenerative disc disease, lumbar   . Degenerative disc disease, cervical   . Chest pain 2008    2008-normal coronary angiography  . Renal cell carcinoma     Right nephrectomy      Surgical History:  Past Surgical History  Procedure Laterality Date  . Tubal ligation  1983    Bilateral  . Nephrectomy      Right; secondary to renal cell carcinoma  . Colonoscopy N/A 04/21/2014    Procedure: COLONOSCOPY;  Surgeon: Malissa Hippo, MD;  Location: [facility];  Service: Endoscopy;  Laterality: N/A;  . Colonoscopy N/A 04/21/2014    Procedure: COLONOSCOPY;  Surgeon: Malissa Hippo, MD;  Location: [facility];  Service: Endoscopy;  Laterality: N/A;  1030     Home Meds: Prior to Admission medications   Medication Sig Start Date End Date Taking? Authorizing Provider  acyclovir (ZOVIRAX) 400 MG tablet Take 1 tablet (400 mg total) by mouth daily. 08/30/14  Yes Alla German, MD  amLODipine (NORVASC) 10 MG tablet Take 10 mg by  mouth every morning.   Yes Historical Provider, MD  aspirin 81 MG chewable tablet Chew 81 mg by mouth daily.   Yes Historical Provider, MD  atenolol (TENORMIN) 25 MG tablet Take 25 mg by mouth every morning.    Yes Historical Provider, MD  Bortezomib (VELCADE IJ) Inject as directed. Days 1, 4, 8, 11 every 21 days 09/05/14  Yes Historical Provider, MD  budesonide-formoterol (SYMBICORT) 160-4.5 MCG/ACT inhaler Inhale 2 puffs into the lungs daily as needed (Shortness Of Breath).    Yes Historical Provider, MD  calcitRIOL (ROCALTROL) 0.25 MCG capsule 0.25 mcg every Monday, Wednesday, and Friday.    Yes Historical Provider, MD  dexamethasone (DECADRON) 4 MG tablet Take 10 mg by mouth as directed. Take a dose the day before and the day after Velcade treatments.   Yes Historical Provider, MD  FLUoxetine (PROZAC) 10 MG tablet Take 10 mg by mouth every morning.   Yes Historical Provider, MD  fluticasone (FLONASE) 50 MCG/ACT nasal spray Place 2 sprays into both nostrils daily. 08/26/14  Yes Alla German, MD  furosemide (LASIX) 40 MG tablet Take 40 mg by mouth 2 (two) times daily.   Yes Historical Provider, MD  hydrALAZINE (APRESOLINE) 25 MG tablet Take 25 mg by mouth 3 (three) times daily.  06/26/12  Yes Kerri Perches, MD  hydrALAZINE (APRESOLINE) 50 MG tablet Take 50 mg by mouth 3 (three) times daily.   Yes Historical Provider,  MD  lenalidomide (REVLIMID) 25 MG capsule Take 25 mg by mouth as directed. Take 1 capsule daily for 14 days, rest 7 days, and repeat. 08/30/14  Yes Alla German, MD  levothyroxine (SYNTHROID, LEVOTHROID) 100 MCG tablet Take 100 mcg by mouth every Monday, Wednesday, and Friday.   Yes Historical Provider, MD  levothyroxine (SYNTHROID, LEVOTHROID) 50 MCG tablet Take 50 mcg by mouth every Tuesday, Thursday, Saturday, and Sunday.   Yes Historical Provider, MD  lovastatin (MEVACOR) 40 MG tablet Take 40 mg by mouth daily.   Yes Historical Provider, MD  meclizine (ANTIVERT) 25 MG tablet  Take 25 mg by mouth 3 (three) times daily as needed for dizziness.   Yes Historical Provider, MD  Multiple Vitamin (MULTIVITAMIN WITH MINERALS) TABS tablet Take 1 tablet by mouth daily.   Yes Historical Provider, MD  naproxen sodium (ALEVE) 220 MG tablet Take 220 mg by mouth 2 (two) times daily as needed.   Yes Historical Provider, MD  prochlorperazine (COMPAZINE) 10 MG tablet Take 1 tablet (10 mg total) by mouth every 6 (six) hours as needed for nausea or vomiting. 08/29/14  Yes Alla German, MD  spironolactone (ALDACTONE) 25 MG tablet Take 50 mg by mouth daily.    Yes Historical Provider, MD    Inpatient Medications:  . aspirin  300 mg Rectal Daily   Or  . aspirin  325 mg Oral Daily  . clindamycin (CLEOCIN) IV  600 mg Intravenous 3 times per day  . fluticasone  2 spray Each Nare Daily  . ipratropium-albuterol  3 mL Nebulization QID  . levothyroxine  37.5 mcg Intravenous Daily  . methylPREDNISolone (SOLU-MEDROL) injection  60 mg Intravenous Q12H  . pantoprazole (PROTONIX) IV  40 mg Intravenous Q12H    Allergies:  Allergies  Allergen Reactions  . Morphine Nausea And Vomiting    History   Social History  . Marital Status: Divorced    Spouse Name: N/A    Number of Children: 2  . Years of Education: N/A   Occupational History  . Nursing assistant    Social History Main Topics  . Smoking status: Never Smoker   . Smokeless tobacco: Never Used  . Alcohol Use: No  . Drug Use: No  . Sexual Activity: Yes    Birth Control/ Protection: None   Other Topics Concern  . Not on file   Social History Narrative   Three grandchildren      Family History  Problem Relation Age of Onset  . Heart disease Mother 75  . Hypertension Mother   . Colon cancer Father   . Hypertension Sister     x2  . Diabetes Sister     x2  . Heart failure Sister   . Lupus Brother      ROS:  Please see the history of present illness.   Negative except  not able to vocalize  All other systems  reviewed and negative.    Physical Exam:*   Blood pressure 162/69, pulse 70, temperature 97.5 F (36.4 C), temperature source Oral, resp. rate 20, height 5\' 5"  (1.651 m), weight 237 lb 10.5 oz (107.8 kg), SpO2 100.00%. General: Well developed, Morbidly obese  female in no acute distress. Head: Normocephalic, atraumatic, sclera non-icteric, no xanthomas, nares are without discharge. EENT: normal Lymph Nodes:  none Back: not examined, n ss Neck: Negative for carotid bruits.   Lungs: Clear bilaterally to auscultation without wheezes, rales, or rhonchi. Breathing is unlabored. Heart: RRR with S1 S2. No murmur ,  rubs, or gallops appreciated. Abdomen: Soft, non-tender, non-distended with normoactive bowel sounds. No hepatomegaly. No rebound/guarding. No obvious abdominal masses. Msk:  Strength and tone appear normal for age. Extremities: No clubbing or cyanosis. No  edema.  Distal pedal pulses are 2+ and equal bilaterally. Skin: Warm and Dry Neuro: Alert   CN III-XII intactaphasia with R Hparesis Psych:  Responds to questions with acknowledgemnet      Labs: Cardiac Enzymes  Recent Labs  09/20/14 2155 09/21/14 0347 09/21/14 1142  TROPONINI <0.30 <0.30 <0.30   CBC Lab Results  Component Value Date   WBC 9.6 09/22/2014   HGB 10.6* 09/22/2014   HCT 30.0* 09/22/2014   MCV 88.8 09/22/2014   PLT 100* 09/22/2014   PROTIME: No results found for this basename: LABPROT, INR,  in the last 72 hours Chemistry  Recent Labs Lab 09/23/14 0529  NA 146  K 3.7  CL 108  CO2 24  BUN 52*  CREATININE 1.72*  CALCIUM 9.6  PROT 6.7  BILITOT 0.5  ALKPHOS 46  ALT 44*  AST 12  GLUCOSE 197*   Lipids Lab Results  Component Value Date   CHOL 171 09/19/2014   HDL 79 09/19/2014   LDLCALC 56 09/19/2014   TRIG 182* 09/19/2014   BNP Pro B Natriuretic peptide (BNP)  Date/Time Value Ref Range Status  09/19/2014 10:04 AM 1843.0* 0 - 125 pg/mL Final  12/11/2007  2:00 AM 128.0*  Final    Miscellaneous Lab Results  Component Value Date   DDIMER 0.33 04/13/2012    Radiology/Studies:  Ct Abdomen Pelvis Wo Contrast  09/21/2014   CLINICAL DATA:  Evaluate for septic emboli. History of renal cell cancer, multiple myeloma.  EXAM: CT CHEST, ABDOMEN AND PELVIS WITHOUT CONTRAST  TECHNIQUE: Multidetector CT imaging of the chest, abdomen and pelvis was performed following the standard protocol without IV contrast.  COMPARISON:  CT 05/11/2014 and 04/20/2014.  FINDINGS: CT CHEST FINDINGS  Thoracic aorta normal caliber. Atherosclerotic vascular changes noted stable cardiomegaly. Coronary artery disease.  Shotty mediastinal lymph nodes.  Thoracic esophagus is unremarkable.  Endobronchial lesion is noted in the right mainstem bronchus, most likely mucous plugging. Similar findings noted in the trachea to a lesser degree. New also severe bilateral pulmonary infiltrates most consistent with pneumonia. Pulmonary edema could present this fashion. Tiny pleural effusions. No pneumothorax.  Calcified right thyroid lobe mass again noted. Chest wall is intact.  CT ABDOMEN AND PELVIS FINDINGS  Liver normal. Spleen normal. Pancreas normal. No biliary distention. The gallbladder is nondistended.  Right nephrectomy. Right surgical bed is unremarkable. No mass lesion. Left kidney is unremarkable. No hydronephrosis or obstructing ureteral stone. Foley catheter is noted in the bladder. Air is noted bladder pelvis is most likely from instrumentation. Uterus and adnexa are unremarkable.  No significant adenopathy. Aortoiliac atherosclerotic vascular disease.  Appendiceal region is normal. There is no bowel distention. No free air. No mesenteric mass. Mild subcutaneous edema noted over the lower flanks. Developing mild anasarca could present in this fashion. Correlation to exclude cellulitis or developing hematoma suggested.  Diffuse severe thoracolumbar spine degenerative change. Sclerotic changes about the SI joints are  stable.  IMPRESSION: 1. New severe bilateral pulmonary infiltrates most consistent with pneumonia. Pulmonary edema could present in this fashion. 2. Endotracheal and right endobronchial soft tissue densities, most likely mucous plugging. Possibility of a and bronchial primary or metastatic lesion cannot be excluded. 3. Right nephrectomy. Nephrectomy bed appears stable. No recurrent mass. 4. Mild subcutaneous edema noted over the  lower flanks. Developing obstructive could present in this fashion. Clinical correlation is is suggested to exclude developing cellulitis or developing hematomas in these regions.   Electronically Signed   By: Maisie Fus  Register   On: 09/20/2014 21:55   Ct Head Wo Contrast  09/18/2014   CLINICAL DATA:  Code stroke, altered level of consciousness  EXAM: CT HEAD WITHOUT CONTRAST  TECHNIQUE: Contiguous axial images were obtained from the base of the skull through the vertex without intravenous contrast.  COMPARISON:  None  FINDINGS: Cavum septum pellucidum and vergae.  Otherwise normal ventricular morphology.  No midline shift.  Old high RIGHT frontal infarct.  Large acute LEFT MCA territory infarct involving the LEFT temporal and parietal lobes with effacement of sulci.  No definite dense middle cerebral artery sign.  No intracranial hemorrhage, mass lesion or extra-axial fluid collections.  Low-attenuation at posterior aspect of LEFT cerebellar hemisphere may represent a small old infarct as well.  Minimal small vessel chronic ischemic changes of deep cerebral white matter.  No acute bone or sinus findings.  Bony excrescences are identified at the sphenoid wings bilaterally potentially developmental variants or exostoses though difficult to completely exclude calcified meningiomas, considered much less likely.  IMPRESSION: Large LEFT MCA territory infarct involving LEFT temporal and parietal lobes.  No acute intracranial hemorrhage or midline shift.  Old small high RIGHT frontal and  questionable LEFT cerebellar infarcts.  Critical Value/emergent results were called by telephone at the time of interpretation on 09/18/2014 at 1556 hr to Dr. Donnetta Hutching, who verbally acknowledged these results.   Electronically Signed   By: Ulyses Southward M.D.   On: 09/18/2014 15:58   Ct Chest Wo Contrast  09/20/2014   CLINICAL DATA:  Evaluate for septic emboli. History of renal cell cancer, multiple myeloma.  EXAM: CT CHEST, ABDOMEN AND PELVIS WITHOUT CONTRAST  TECHNIQUE: Multidetector CT imaging of the chest, abdomen and pelvis was performed following the standard protocol without IV contrast.  COMPARISON:  CT 05/11/2014 and 04/20/2014.  FINDINGS: CT CHEST FINDINGS  Thoracic aorta normal caliber. Atherosclerotic vascular changes noted stable cardiomegaly. Coronary artery disease.  Shotty mediastinal lymph nodes.  Thoracic esophagus is unremarkable.  Endobronchial lesion is noted in the right mainstem bronchus, most likely mucous plugging. Similar findings noted in the trachea to a lesser degree. New also severe bilateral pulmonary infiltrates most consistent with pneumonia. Pulmonary edema could present this fashion. Tiny pleural effusions. No pneumothorax.  Calcified right thyroid lobe mass again noted. Chest wall is intact.  CT ABDOMEN AND PELVIS FINDINGS  Liver normal. Spleen normal. Pancreas normal. No biliary distention. The gallbladder is nondistended.  Right nephrectomy. Right surgical bed is unremarkable. No mass lesion. Left kidney is unremarkable. No hydronephrosis or obstructing ureteral stone. Foley catheter is noted in the bladder. Air is noted bladder pelvis is most likely from instrumentation. Uterus and adnexa are unremarkable.  No significant adenopathy. Aortoiliac atherosclerotic vascular disease.  Appendiceal region is normal. There is no bowel distention. No free air. No mesenteric mass. Mild subcutaneous edema noted over the lower flanks. Developing mild anasarca could present in this fashion.  Correlation to exclude cellulitis or developing hematoma suggested.  Diffuse severe thoracolumbar spine degenerative change. Sclerotic changes about the SI joints are stable.  IMPRESSION: 1. New severe bilateral pulmonary infiltrates most consistent with pneumonia. Pulmonary edema could present in this fashion. 2. Endotracheal and right endobronchial soft tissue densities, most likely mucous plugging. Possibility of a and bronchial primary or metastatic lesion cannot be excluded. 3.  Right nephrectomy. Nephrectomy bed appears stable. No recurrent mass. 4. Mild subcutaneous edema noted over the lower flanks. Developing obstructive could present in this fashion. Clinical correlation is is suggested to exclude developing cellulitis or developing hematomas in these regions.   Electronically Signed   By: Maisie Fus  Register   On: 09/20/2014 21:55   Mr Maxine Glenn Head Wo Contrast  09/19/2014   ADDENDUM REPORT: 09/19/2014 10:07  ADDENDUM: Study discussed by telephone with Dr. Kerry Hough On 09/19/2014 at 0958 hrs.   Electronically Signed   By: Augusto Gamble M.D.   On: 09/19/2014 10:07   09/19/2014   CLINICAL DATA:  68 year old female with left MCA infarct detected by CT presenting with code stroke, altered mental status. Initial encounter.  EXAM: MRI HEAD WITHOUT CONTRAST  MRA HEAD WITHOUT CONTRAST  TECHNIQUE: Multiplanar, multiecho pulse sequences of the brain and surrounding structures were obtained without intravenous contrast. Angiographic images of the head were obtained using MRA technique without contrast.  COMPARISON:  Head CT without contrast 09/18/2014.  FINDINGS: MRI HEAD FINDINGS  Large confluent and intensely restricted diffusion in the left hemisphere extending from the insula and operculum posteriorly, involving most of the left occipital lobe as well as the posterior temporal lobe. Small nodular areas of restricted diffusion in the left basal ganglia, which otherwise are spared. Superimposed multiple bilateral punctate and  nodular foci of restricted diffusion, including involvement of the right MCA and PCA territories. Furthermore there are multiple nodular foci restricted diffusion in both cerebellar hemispheres. See series 100.  Cytotoxic edema in the affected territories. Still, intracranial mass effect is Mild at this time, with Trace rightward midline shift. No definite petechial hemorrhage.  Major intracranial vascular flow voids are preserved.  Cavum septum pellucidum. Mass effect on the left lateral ventricle. No ventriculomegaly. Basilar cisterns remain patent. Negative pituitary, cervicomedullary junction, and grossly negative visualized cervical spine. Chronic lacunar infarcts superimposed in the right corona radiata and right superior frontal gyrus subcortical white matter (with small focus of cortical encephalomalacia).  Visualized orbit soft tissues are within normal limits. Visualized paranasal sinuses and mastoids are clear. Normal bone marrow signal. No acute scalp soft tissue findings.  MRA HEAD FINDINGS  Antegrade flow in the posterior circulation with codominant distal vertebral arteries. Patent vertebrobasilar junction. Right PICA origin is patent. Left PICA less well visualized. Basilar artery irregularity without hemodynamically significant stenosis. Both PCA origins are patent. The right posterior communicating artery is present, the left is diminutive or absent. Bilateral PCA branches are within normal limits.  Antegrade flow in both ICA siphons. Bilateral siphon irregularity is moderate. No definite hemodynamically significant ICA siphon stenosis. Patent carotid termini. Normal right MCA and bilateral ACA origins.  The left MCA is occluded in the M1 segment 10 mm beyond its origin. Paucity of flow signal only in the left MCA anterior division M2 branches.  Anterior communicating artery diminutive or absent. Visualized bilateral ACA branches are within normal limits. Right MCA M1 segment and bifurcation are  patent with mild to moderate irregularity. No definite M2 branch occlusion on the right.  IMPRESSION: 1. Large acute left MCA infarct. Cytotoxic edema and mass effect but no associated hemorrhage at this time. 2. Numerous scattered small acute infarcts in the bilateral MCA, right PCA, and bilateral PICA territories. Constellation most compatible with recent embolic event (cardiac or proximal aortic source). 3. Left MCA occlusion in the M1 segment. Some reconstituted flow only in the anterior left MCA division. 4. No other major circle of Willis branch occlusion.  Moderate irregularity of both ICA siphons.  Electronically Signed: By: Augusto Gamble M.D. On: 09/19/2014 09:44   Mr Brain Wo Contrast  09/19/2014   ADDENDUM REPORT: 09/19/2014 10:07  ADDENDUM: Study discussed by telephone with Dr. Kerry Hough On 09/19/2014 at 0958 hrs.   Electronically Signed   By: Augusto Gamble M.D.   On: 09/19/2014 10:07   09/19/2014   CLINICAL DATA:  68 year old female with left MCA infarct detected by CT presenting with code stroke, altered mental status. Initial encounter.  EXAM: MRI HEAD WITHOUT CONTRAST  MRA HEAD WITHOUT CONTRAST  TECHNIQUE: Multiplanar, multiecho pulse sequences of the brain and surrounding structures were obtained without intravenous contrast. Angiographic images of the head were obtained using MRA technique without contrast.  COMPARISON:  Head CT without contrast 09/18/2014.  FINDINGS: MRI HEAD FINDINGS  Large confluent and intensely restricted diffusion in the left hemisphere extending from the insula and operculum posteriorly, involving most of the left occipital lobe as well as the posterior temporal lobe. Small nodular areas of restricted diffusion in the left basal ganglia, which otherwise are spared. Superimposed multiple bilateral punctate and nodular foci of restricted diffusion, including involvement of the right MCA and PCA territories. Furthermore there are multiple nodular foci restricted diffusion in both  cerebellar hemispheres. See series 100.  Cytotoxic edema in the affected territories. Still, intracranial mass effect is Mild at this time, with Trace rightward midline shift. No definite petechial hemorrhage.  Major intracranial vascular flow voids are preserved.  Cavum septum pellucidum. Mass effect on the left lateral ventricle. No ventriculomegaly. Basilar cisterns remain patent. Negative pituitary, cervicomedullary junction, and grossly negative visualized cervical spine. Chronic lacunar infarcts superimposed in the right corona radiata and right superior frontal gyrus subcortical white matter (with small focus of cortical encephalomalacia).  Visualized orbit soft tissues are within normal limits. Visualized paranasal sinuses and mastoids are clear. Normal bone marrow signal. No acute scalp soft tissue findings.  MRA HEAD FINDINGS  Antegrade flow in the posterior circulation with codominant distal vertebral arteries. Patent vertebrobasilar junction. Right PICA origin is patent. Left PICA less well visualized. Basilar artery irregularity without hemodynamically significant stenosis. Both PCA origins are patent. The right posterior communicating artery is present, the left is diminutive or absent. Bilateral PCA branches are within normal limits.  Antegrade flow in both ICA siphons. Bilateral siphon irregularity is moderate. No definite hemodynamically significant ICA siphon stenosis. Patent carotid termini. Normal right MCA and bilateral ACA origins.  The left MCA is occluded in the M1 segment 10 mm beyond its origin. Paucity of flow signal only in the left MCA anterior division M2 branches.  Anterior communicating artery diminutive or absent. Visualized bilateral ACA branches are within normal limits. Right MCA M1 segment and bifurcation are patent with mild to moderate irregularity. No definite M2 branch occlusion on the right.  IMPRESSION: 1. Large acute left MCA infarct. Cytotoxic edema and mass effect but no  associated hemorrhage at this time. 2. Numerous scattered small acute infarcts in the bilateral MCA, right PCA, and bilateral PICA territories. Constellation most compatible with recent embolic event (cardiac or proximal aortic source). 3. Left MCA occlusion in the M1 segment. Some reconstituted flow only in the anterior left MCA division. 4. No other major circle of Willis branch occlusion. Moderate irregularity of both ICA siphons.  Electronically Signed: By: Augusto Gamble M.D. On: 09/19/2014 09:44   US Soft Tissue Head/neck  08/31/2014   CLINICAL DATA:  thyroid nodule. FNA biopsy of dominant right nodule 09/07/2013.  EXAM: THYROID ULTRASOUND  TECHNIQUE: Ultrasound examination of the thyroid gland and adjacent soft tissues was performed.  COMPARISON:  08/04/2013  FINDINGS: Right thyroid lobe  Measurements: 50 x 23 x 32 mm. Dominant 36 x 20 x 25 mm complex solid nodule, mid lobe (previously 19 x 26 x 29). Adjacent 9 x 6 mm complex cyst at its inferior margin (previously 6 x 8 x 9).  Left thyroid lobe  Measurements: 48 x 18 x 27 mm. Markedly inhomogeneous background echotexture. Poorly marginated 14 x 7 x 10 mm hypoechoic lesion, mid lobe (previously 7 x 9 x 8). 8 x 6 x 9 mm complex cyst, inferior pole.  Isthmus  Thickness: 3 mm.  No nodules visualized.  Lymphadenopathy  None visualized.  IMPRESSION: 1. Thyromegaly with bilateral nodules. Enlargement of dominant right lesion; correlate with previous biopsy results. No other lesion meets current consensus criteria for biopsy. Follow-up by clinical exam is recommended. If patient has known risk factors for thyroid carcinoma, consider follow-up ultrasound in 12 months. If patient is clinically hyperthyroid, consider nuclear medicine thyroid uptake and scan. This recommendation follows the consensus statement: Management of Thyroid Nodules Detected as [place]: Society of Radiologists in Ultrasound Consensus Conference Statement. Radiology 2005; [id number].   Electronically  Signed   By: Oley Balm M.D.   On: 08/31/2014 14:30   Dg Chest Port 1 View  09/21/2014   CLINICAL DATA:  68 year old female with shortness of breath. Left MCA infarct, aspiration pneumonia.  EXAM: PORTABLE CHEST - 1 VIEW  COMPARISON:  09/18/2014 and prior chest radiographs dating back to 05/08/2000  FINDINGS: Cardiomegaly again noted.  Elevation of the right hemidiaphragm and bibasilar atelectasis again noted.  Pulmonary vascular congestion again identified.  There has been interval improvement in left lung aeration.  There is no evidence of pneumothorax.  No other significant change identified.  IMPRESSION: Improved left lung aeration without other significant change.   Electronically Signed   By: Laveda Abbe M.D.   On: 09/21/2014 08:15   Dg Chest Portable 1 View  09/18/2014   CLINICAL DATA:  Stroke.  EXAM: PORTABLE CHEST - 1 VIEW  COMPARISON:  Chest CT 05/11/2014  FINDINGS: Single view of the chest demonstrates low lung volumes. Patchy airspace densities in the left lung. There may be subtle airspace disease in the right lung. Heart size is accentuated by the low lung volumes.  IMPRESSION: Patchy airspace disease, particularly in the left lung. Findings are concerning for pneumonia or aspiration. Asymmetric pulmonary edema is also in the differential diagnosis.   Electronically Signed   By: Richarda Overlie M.D.   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

## 2014-09-23 NOTE — Progress Notes (Signed)
Pt' abdominal X-Ray (KUB) result came back and NP Kathline Magic (on call) paged to verify placement of NG Tube, called back and said to advance tube with about 2", same done and re taped to pt's nose, pt repositioned in bed, will however continue to monitor. Obasogie-Asidi, Claryssa Sandner Efe

## 2014-09-23 NOTE — Progress Notes (Addendum)
INITIAL NUTRITION ASSESSMENT  DOCUMENTATION CODES Per approved criteria  -Obesity Unspecified   INTERVENTION:  Once Panda feeding tube placed & verified, initiate Glucerna 1.2 formula at 25 ml/hr and increase by 10 ml every 4 hours to goal rate of 65 ml/hr to provide 1872 kcals, 94 gm protein, 1256 ml of free water RD to follow for nutrition care plan  NUTRITION DIAGNOSIS: Inadequate oral intake related to inability to eat, s/p CVA as evidenced by NPO status  Goal: Pt to meet >/= 90% of their estimated nutrition needs   Monitor:  TF regimen & tolerance, weight, labs, I/O's  Reason for Assessment: Consult  68 y.o. female  Admitting Dx: CVA (cerebral infarction)  ASSESSMENT: 68 y.o. female with PMH significant for HTN, hyperlipidemia, renal cell carcinoma s/p right nephrectomy, multiple myeloma on chemotherapy, transferred to [facility] for further stroke management.  MRI showed large acute L MCA stroke and numerous scattered small acute infarcts in the bilateral MCA, right PCA, and bilateral PICA territories with right hemiparesis.  RD consulted for TF initiation & management.  RD unable to obtain nutrition hx.  No family at bedside.  No nutrition problems identified PTA.  Pt has been NPO since hospital admission 9/27 (transferred from [facility]).  Panda feeding tube placement ordered and pending.  Nutrition focused physical exam completed.  No muscle or subcutaneous fat depletion noticed.  Height: Ht Readings from Last 1 Encounters:  09/19/14 $RemoveB'5\' 5"'VwVjKsgT$  (1.651 m)    Weight: Wt Readings from Last 1 Encounters:  09/19/14 237 lb 10.5 oz (107.8 kg)    Ideal Body Weight: 125 lb  % Ideal Body Weight: 189%  Wt Readings from Last 10 Encounters:  09/19/14 237 lb 10.5 oz (107.8 kg)  09/19/14 237 lb 10.5 oz (107.8 kg)  09/19/14 237 lb 10.5 oz (107.8 kg)  09/15/14 245 lb 3.2 oz (111.222 kg)  09/12/14 246 lb 6.4 oz (111.766 kg)  09/08/14 247 lb (112.038 kg)  09/05/14 244 lb 6.4 oz (110.859  kg)  08/26/14 243 lb 12.8 oz (110.587 kg)  08/05/14 244 lb 3.2 oz (110.768 kg)  05/04/14 242 lb 6.4 oz (109.952 kg)    Usual Body Weight: 245 lb  % Usual Body Weight: 97%  BMI:  Body mass index is 39.55 kg/(m^2).  Estimated Nutritional Needs: Kcal: 1800-2000 Protein: 90-100 gm Fluid: 1.8-2.0 L  Skin: Intact  Diet Order: NPO  EDUCATION NEEDS: -No education needs identified at this time   Intake/Output Summary (Last 24 hours) at 09/23/14 1443 Last data filed at 09/23/14 1239  Gross per 24 hour  Intake    400 ml  Output      0 ml  Net    400 ml   Labs:   Recent Labs Lab 09/20/14 1525 09/20/14 2155 09/21/14 0347 09/22/14 0348 09/23/14 0529  NA 141  --  143 146 146  K 3.6*  --  4.2 3.9 3.7  CL 103  --  105 107 108  CO2 24  --  $R'23 24 24  'Gg$ BUN 25*  --  29* 39* 52*  CREATININE 1.59*  --  1.71* 1.76* 1.72*  CALCIUM 8.8  --  9.0 9.5 9.6  MG 2.2 2.2 2.3  --   --   GLUCOSE 94  --  120* 162* 197*    CBG (last 3)   Recent Labs  09/22/14 1830 09/22/14 2328 09/23/14 0634  GLUCAP 184* 176* 180*    Scheduled Meds: . ampicillin-sulbactam (UNASYN) IV  3 g Intravenous Q6H  .  aspirin  300 mg Rectal Daily   Or  . aspirin  325 mg Oral Daily  . fluticasone  2 spray Each Nare Daily  . ipratropium-albuterol  3 mL Nebulization QID  . levothyroxine  37.5 mcg Intravenous Daily  . methylPREDNISolone (SOLU-MEDROL) injection  60 mg Intravenous Q12H  . pantoprazole (PROTONIX) IV  40 mg Intravenous Q12H    Continuous Infusions: . sodium chloride 150 mL/hr at 09/23/14 0957    Past Medical History  Diagnosis Date  . Vertigo, intermittent   . Hypothyroidism   . Hyperlipidemia   . Obesity   . Hypertension     severe and resistant to treatment; 2008-negative left renal angiogram  . Vitiligo   . Degenerative disc disease, lumbar   . Degenerative disc disease, cervical   . Chest pain 2008    2008-normal coronary angiography  . Renal cell carcinoma     Right  nephrectomy    Past Surgical History  Procedure Laterality Date  . Tubal ligation  1983    Bilateral  . Nephrectomy      Right; secondary to renal cell carcinoma  . Colonoscopy N/A 04/21/2014    Procedure: COLONOSCOPY;  Surgeon: Rogene Houston, MD;  Location: [facility];  Service: Endoscopy;  Laterality: N/A;  . Colonoscopy N/A 04/21/2014    Procedure: COLONOSCOPY;  Surgeon: Rogene Houston, MD;  Location: [facility];  Service: Endoscopy;  Laterality: N/A;  [place], RD, LDN Pager #: [phone] After-Hours Pager #: [phone]

## 2014-09-23 NOTE — Progress Notes (Signed)
  Echocardiogram Echocardiogram Transesophageal has been performed.  Philipp Deputy 09/23/2014, 2:15 PM

## 2014-09-23 NOTE — Progress Notes (Signed)
She has been npo for 5 days and Bun Cr>>55/1.7  Will increase IV fluids from [facility]  For now until primary team is able to address

## 2014-09-23 NOTE — Progress Notes (Signed)
NG tube inserted without difficulty in Lt nare and patient tolerated procedure well. RN will continue to monitor patient status. Aisha RN

## 2014-09-23 NOTE — Progress Notes (Signed)
ANTIBIOTIC CONSULT NOTE - INITIAL  Pharmacy Consult for Unasyn Indication: aspiration PNA  Allergies  Allergen Reactions  . Morphine Nausea And Vomiting    Patient Measurements: Height: 5\' 5"  (165.1 cm) Weight: 237 lb 10.5 oz (107.8 kg) IBW/kg (Calculated) : 57  Vital Signs: Temp: 98 F (36.7 C) (10/02 1008) Temp Source: Oral (10/02 1008) BP: 166/64 mmHg (10/02 1008) Pulse Rate: 80 (10/02 1008) Intake/Output from previous day: 10/01 0701 - 10/02 0700 In: 825 [I.V.:825] Out: -  Intake/Output from this shift:    Labs:  Recent Labs  09/20/14 1525 09/21/14 0347 09/22/14 0348 09/23/14 0529  WBC 10.7* 11.1* 9.6  --   HGB 10.0* 10.9* 10.6*  --   PLT 39* 54* 100*  --   CREATININE 1.59* 1.71* 1.76* 1.72*   Estimated Creatinine Clearance: 38.2 ml/min (by C-G formula based on Cr of 1.72).    Microbiology: Recent Results (from the past 720 hour(s))  MRSA PCR SCREENING     Status: None   Collection Time    09/18/14  6:13 PM      Result Value Ref Range Status   MRSA by PCR NEGATIVE  NEGATIVE Final   Comment:            The GeneXpert MRSA Assay (FDA     approved for NASAL specimens     only), is one component of a     comprehensive MRSA colonization     surveillance program. It is not     intended to diagnose MRSA     infection nor to guide or     monitor treatment for     MRSA infections.  CULTURE, RESPIRATORY (NON-EXPECTORATED)     Status: None   Collection Time    09/20/14  8:42 PM      Result Value Ref Range Status   Specimen Description TRACHEAL ASPIRATE   Final   Special Requests Normal   Final   Gram Stain     Final   Value: FEW WBC PRESENT,BOTH PMN AND MONONUCLEAR     FEW SQUAMOUS EPITHELIAL CELLS PRESENT     MODERATE GRAM NEGATIVE RODS     FEW GRAM POSITIVE COCCI     IN PAIRS   Culture     Final   Value: Non-Pathogenic Oropharyngeal-type Flora Isolated.     Performed at [facility]   Report Status 09/23/2014 FINAL   Final  CULTURE, BLOOD  (ROUTINE X 2)     Status: None   Collection Time    09/20/14  9:55 PM      Result Value Ref Range Status   Specimen Description BLOOD RIGHT ARM   Final   Special Requests     Final   Value: BOTTLES DRAWN AEROBIC AND ANAEROBIC 10CC AEROBIC 5CC ANAEROBIC   Culture  Setup Time     Final   Value: 09/21/2014 03:28     Performed at [facility]   Culture     Final   Value:        BLOOD CULTURE RECEIVED NO GROWTH TO DATE CULTURE WILL BE HELD FOR 5 DAYS BEFORE ISSUING A FINAL NEGATIVE REPORT     Performed at [facility]   Report Status PENDING   Incomplete  CULTURE, BLOOD (ROUTINE X 2)     Status: None   Collection Time    09/20/14 10:05 PM      Result Value Ref Range Status   Specimen Description BLOOD RIGHT HAND  Final   Special Requests BOTTLES DRAWN AEROBIC ONLY 10CC   Final   Culture  Setup Time     Final   Value: 09/21/2014 03:29     Performed at [facility]   Culture     Final   Value:        BLOOD CULTURE RECEIVED NO GROWTH TO DATE CULTURE WILL BE HELD FOR 5 DAYS BEFORE ISSUING A FINAL NEGATIVE REPORT     Performed at [facility]   Report Status PENDING   Incomplete    Medical History: Past Medical History  Diagnosis Date  . Vertigo, intermittent   . Hypothyroidism   . Hyperlipidemia   . Obesity   . Hypertension     severe and resistant to treatment; 2008-negative left renal angiogram  . Vitiligo   . Degenerative disc disease, lumbar   . Degenerative disc disease, cervical   . Chest pain 2008    2008-normal coronary angiography  . Renal cell carcinoma     Right nephrectomy    Medications:  Scheduled:  . aspirin  300 mg Rectal Daily   Or  . aspirin  325 mg Oral Daily  . fluticasone  2 spray Each Nare Daily  . ipratropium-albuterol  3 mL Nebulization QID  . levothyroxine  37.5 mcg Intravenous Daily  . methylPREDNISolone (SOLU-MEDROL) injection  60 mg Intravenous Q12H  . pantoprazole (PROTONIX) IV  40 mg Intravenous Q12H    Assessment: 68 yo F s/p large L MCA stroke.  Pt was initially started on Clindamycin 9/27 for aspiration PNA.  Pharmacy has been asked to change to Unasyn to complete a 8d course.  Goal of Therapy:  Renal dose adjustment; Resolve of infection  Plan:  Unasyn 3gm IV q6h MD consider stop date of 10/5 to complete a total of 8d of therapy. No further adjustment needed. Rx will sign off.  [employer], Pharm.D., BCPS Clinical Pharmacist Pager [phone] 09/23/2014 11:15 AM

## 2014-09-24 ENCOUNTER — Inpatient Hospital Stay (HOSPITAL_COMMUNITY): Payer: Medicare Other

## 2014-09-24 LAB — COMPREHENSIVE METABOLIC PANEL
ALK PHOS: 46 U/L (ref 39–117)
ALT: 33 U/L (ref 0–35)
AST: 9 U/L (ref 0–37)
Albumin: 2.5 g/dL — ABNORMAL LOW (ref 3.5–5.2)
Anion gap: 12 (ref 5–15)
BILIRUBIN TOTAL: 0.4 mg/dL (ref 0.3–1.2)
BUN: 59 mg/dL — AB (ref 6–23)
CO2: 24 mEq/L (ref 19–32)
Calcium: 9.3 mg/dL (ref 8.4–10.5)
Chloride: 114 mEq/L — ABNORMAL HIGH (ref 96–112)
Creatinine, Ser: 1.76 mg/dL — ABNORMAL HIGH (ref 0.50–1.10)
GFR calc non Af Amer: 29 mL/min — ABNORMAL LOW (ref >90)
GFR, EST AFRICAN AMERICAN: 33 mL/min — AB (ref >90)
GLUCOSE: 225 mg/dL — AB (ref 70–99)
POTASSIUM: 3.8 meq/L (ref 3.7–5.3)
Sodium: 150 mEq/L — ABNORMAL HIGH (ref 137–147)
Total Protein: 6.4 g/dL (ref 6.0–8.3)

## 2014-09-24 LAB — GLUCOSE, CAPILLARY
GLUCOSE-CAPILLARY: 168 mg/dL — AB (ref 70–99)
GLUCOSE-CAPILLARY: 166 mg/dL — AB (ref 70–99)
Glucose-Capillary: 215 mg/dL — ABNORMAL HIGH (ref 70–99)
Glucose-Capillary: 155 mg/dL — ABNORMAL HIGH (ref 70–99)
Glucose-Capillary: 184 mg/dL — ABNORMAL HIGH (ref 70–99)

## 2014-09-24 LAB — CBC
HCT: 30.9 % — ABNORMAL LOW (ref 36.0–46.0)
HEMOGLOBIN: 10.8 g/dL — AB (ref 12.0–15.0)
MCH: 30.7 pg (ref 26.0–34.0)
MCHC: 35 g/dL (ref 30.0–36.0)
MCV: 87.8 fL (ref 78.0–100.0)
Platelets: 238 10*3/uL (ref 150–400)
RBC: 3.52 MIL/uL — ABNORMAL LOW (ref 3.87–5.11)
RDW: 16.6 % — AB (ref 11.5–15.5)
WBC: 10.7 10*3/uL — ABNORMAL HIGH (ref 4.0–10.5)

## 2014-09-24 MED ORDER — METHYLPREDNISOLONE SODIUM SUCC 40 MG IJ SOLR
40.0000 mg | Freq: Two times a day (BID) | INTRAMUSCULAR | Status: AC
  Administered 2014-09-24 – 2014-09-26 (×4): 40 mg via INTRAVENOUS
  Filled 2014-09-24 (×4): qty 1

## 2014-09-24 MED ORDER — SODIUM CHLORIDE 0.45 % IV SOLN
INTRAVENOUS | Status: DC
  Administered 2014-09-24: 09:00:00 via INTRAVENOUS

## 2014-09-24 MED ORDER — ACETAMINOPHEN 325 MG PO TABS
650.0000 mg | ORAL_TABLET | Freq: Four times a day (QID) | ORAL | Status: DC | PRN
  Administered 2014-09-24 – 2014-09-25 (×2): 650 mg via ORAL
  Filled 2014-09-24 (×2): qty 2

## 2014-09-24 MED ORDER — CARVEDILOL 6.25 MG PO TABS
6.2500 mg | ORAL_TABLET | Freq: Two times a day (BID) | ORAL | Status: DC
  Administered 2014-09-24 – 2014-10-01 (×9): 6.25 mg via ORAL
  Filled 2014-09-24 (×9): qty 1

## 2014-09-24 MED ORDER — INSULIN ASPART 100 UNIT/ML ~~LOC~~ SOLN
0.0000 [IU] | SUBCUTANEOUS | Status: DC
  Administered 2014-09-24: 2 [IU] via SUBCUTANEOUS
  Administered 2014-09-24: 1 [IU] via SUBCUTANEOUS
  Administered 2014-09-24: 3 [IU] via SUBCUTANEOUS
  Administered 2014-09-24 – 2014-09-25 (×8): 2 [IU] via SUBCUTANEOUS
  Administered 2014-09-25 – 2014-09-26 (×2): 3 [IU] via SUBCUTANEOUS
  Administered 2014-09-26 (×2): 5 [IU] via SUBCUTANEOUS
  Administered 2014-09-26: 3 [IU] via SUBCUTANEOUS
  Administered 2014-09-26 – 2014-09-27 (×4): 2 [IU] via SUBCUTANEOUS
  Administered 2014-09-27: 3 [IU] via SUBCUTANEOUS
  Administered 2014-09-27: 2 [IU] via SUBCUTANEOUS
  Administered 2014-09-28: 3 [IU] via SUBCUTANEOUS
  Administered 2014-09-28: 1 [IU] via SUBCUTANEOUS
  Administered 2014-09-28: 3 [IU] via SUBCUTANEOUS
  Administered 2014-09-28 – 2014-09-29 (×5): 2 [IU] via SUBCUTANEOUS
  Administered 2014-09-30 – 2014-10-01 (×4): 1 [IU] via SUBCUTANEOUS

## 2014-09-24 MED ORDER — ALBUTEROL SULFATE (2.5 MG/3ML) 0.083% IN NEBU: 2.5000 mg | INHALATION_SOLUTION | RESPIRATORY_TRACT | Status: DC | PRN

## 2014-09-24 MED ORDER — IPRATROPIUM-ALBUTEROL 0.5-2.5 (3) MG/3ML IN SOLN
3.0000 mL | Freq: Two times a day (BID) | RESPIRATORY_TRACT | Status: DC
  Administered 2014-09-24 – 2014-09-30 (×14): 3 mL via RESPIRATORY_TRACT
  Filled 2014-09-24 (×14): qty 3

## 2014-09-24 MED ORDER — CARVEDILOL 3.125 MG PO TABS: 3.1250 mg | ORAL_TABLET | Freq: Two times a day (BID) | ORAL | Status: DC

## 2014-09-24 NOTE — Progress Notes (Signed)
Pt's CBG read 215, no sliding scale ordered, NP Kathline Magic paged and notified,sliding scale insulin commenced at 0407 with 3 units,pt reassured, will continue to monitor.Obasogie-Asidi, Ahnaf Caponi Efe

## 2014-09-24 NOTE — Progress Notes (Addendum)
[facility] PROGRESS NOTE  Tristynn Coupal Yust ZOX:[id number] DOB: 09-Nov-1946 DOA: 09/18/2014 PCP: [name], MD  Assessment/Plan: Principal Problem:   CVA (cerebral infarction) Active Problems:   Aspiration pneumonia   Thrombocytopenia, unspecified   Elevated troponin   CKD (chronic kidney disease) stage 3, GFR 30-59 ml/min    Assessment/Plan:  Principal Problem:  Large Acute L MCA stroke and numerous scattered small acute infarcts in the bilateral MCA, right PCA, and bilateral PICA territories with right hemiparesis  - Findings consistent with cardiac/proximal aortic emboli vs coagulopathy from chemotherapy, malignancy;  MRI Large lefe MCA, but also Numerous scattered small acute infarcts in the bilateral MCA, right PCA, and bilateral PICA territories  MRA L MCA M1 occlusion  Carotid Doppler No significant stenosis  2D Echo No source of embolus  Recommend TEE DID not show cardiac source of emboli, no intracardiac thrombus, there was a PFO LE venous doppler - negative  aspirin 81 mg orally every day prior to admission, now on aspirin 300 mg suppository  HgbA1c 6.4  SCDs for VTE prophylaxis               NPO per ST , ruled out dophoff, may need a PEG tube              Will request interventional radiology to place this on Monday              She also needs a loop recorder by EP before discharge   Aspiration pneumonia  Discontinue clindamycin and switch to Unasyn for a total of 8 days of treatment then DC. Continue to taper  Solu-Medrol, nebs, n.p.o. status   Hypernatremia Change to half normal saline  Dysphagia: Residual dysphagia from CVA  - Currently n.p.o. status, speech therapy following closely, if no meaningful improvement, discussed in detail with patient's daughter at the bedside regarding PANDA tube , will replace for now and initiate tube feeds again Nutrition consult ordered    Diastolic CHF  -Due to permissive hypertension, restart  Coreg  Progressive renal failure  Increase IV fluids   Mildly Elevated Troponin  -Likely due to massive stroke. No EKG changes. Have since normalized . 2-D echo showed EF of 60-65%, no regional wall motion abnormalities.   Thrombocythemia  -Likley due to chemotherapy, improving  l  Multiple myeloma  -Patient just ended one cycle of treatment and is on a planned 7 day rest - doubt her candidacy to return to active tx, unless dramatic improvement in clinical status occurs   Hypertension  Restart Coreg - BP improved today   Hypothyroid  Continue IV Synthroid, at 50 mcg daily,   DVT Prophylaxis:SCD's  Code Status: Full code  Family Communication: Discussed in detail with patient's daughter at the bedside  Disposition:continue on neuro floor    Consultants:  Neurology  Cardiology for TEE Procedures:  9/27 CT head without contrast Large LEFT MCA territory infarct involving LEFT temporal and  parietal lobes.  -Old small high RIGHT frontal & questionable LEFT cerebellar infarcts.  9/27 PCXR Patchy airspace disease,>left lung. C/W pneumonia vs aspiration vs pulmonary edema  9/28 MRI/MRA head without contrast Large acute left MCA infarct. Cytotoxic edema and mass effect but no associated hemorrhage at this time.  -Numerous scattered small acute infarcts in the bilateral MCA/right PCA/bilateral PICA territories. most  C/W embolic event (cardiac or proximal aortic source).  -Left MCA occlusion in the M1 segment.  9/28 echocardiogram with contrast Left ventricle: mild LVH. -LVEF= 60% to 65%. -(grade  1 diastolic dysfunction). - Right ventricle: mildly dilated. - Right atrium: mildly dilated.  - Pulmonary arteries: PA peak pressure: 52 mm Hg (S).    Antibiotics:  Clindamycin 9/27>  Unasyn    Code Status: full  Family Communication: family updated about patient's clinical progress  Disposition Plan: She will need SNF  Brief narrative:  68 y.o. female with a past medical  history significant for HTN, hyperlipidemia, renal cell carcinoma s/p right nephrectomy, multiple myeloma on chemotherapy, transferred to [facility] for further stroke management. Patient has altered mental status and seems to be aphasic, family is unavailable, and thus all clinical information was obtained from the chart. She was initially evaluated at [facility] where notes indicated that " she was brought to the hospital by EMS due to altered mental status. The patient was found by her daughter at approximately 2 PM. She had fallen on the ground at an unknown time and was found on the floor by her daughter. She was altered at that point and had weakness of her right side including right facial droop and was not verbalizing. She was last known to be well last night. Code stroke was called alone neurology determined not to proceed with TPA due to unknown time of onset". At time of consult, she is lethargic but open eyes on verbal commands. On aspirin. last known well unknown.  MRI/MRA brain" large acute left MCA infarct. Cytotoxic edema and mass effect but no associated hemorrhage at this time. Numerous scattered small acute infarcts in the bilateral MCA, right PCA, and bilateral PICA territories.  Constellation most compatible with recent embolic event (cardiac or proximal aortic source). Left MCA occlusion in the M1 segment. Some reconstituted flow only in the anterior left MCA division.  Patient was not administered TPA secondary to delay in arrival. She was admitted to a step down unit for further evaluation and treatment.    Consultants:  Neurology  Cardiology  Procedures:  TEE  Antibiotics:  None  HPI/Subjective: Patient alert dophoff tube last night, very uncomfortable  Objective: Filed Vitals:   09/24/14 0108 09/24/14 0229 09/24/14 0523 09/24/14 0938  BP: 134/59 162/50 167/65 156/64  Pulse: 85 84 84 77  Temp: 98.4 F (36.9 C)  98.7 F (37.1 C) 97.4 F (36.3 C)  TempSrc: Oral  Oral Oral  Resp:  18 18  20   Height:      Weight: 105.8 kg (233 lb 4 oz)     SpO2: 100% 100% 100% 100%    Intake/Output Summary (Last 24 hours) at 09/24/14 1025 Last data filed at 09/23/14 1714  Gross per 24 hour  Intake    100 ml  Output      0 ml  Net    100 ml    Exam:  General: alert & oriented x 3 In NAD  Cardiovascular: RRR, nl S1 s2  Respiratory: Decreased breath sounds at the bases, scattered rhonchi, no crackles  Abdomen: soft +BS NT/ND, no masses palpable  Extremities: No cyanosis and no edema      Data Reviewed: Basic Metabolic Panel:  Recent Labs Lab 09/20/14 1525 09/20/14 2155 09/21/14 0347 09/22/14 0348 09/23/14 0529 09/24/14 0442  NA 141  --  143 146 146 150*  K 3.6*  --  4.2 3.9 3.7 3.8  CL 103  --  105 107 108 114*  CO2 24  --  23 24 24 24   GLUCOSE 94  --  120* 162* 197* 225*  BUN 25*  --  29* 39*  52* 59*  CREATININE 1.59*  --  1.71* 1.76* 1.72* 1.76*  CALCIUM 8.8  --  9.0 9.5 9.6 9.3  MG 2.2 2.2 2.3  --   --   --     Liver Function Tests:  Recent Labs Lab 09/20/14 1525 09/21/14 0347 09/22/14 0348 09/23/14 0529 09/24/14 0442  AST 28 51* 25 12 9   ALT 29 60* 62* 44* 33  ALKPHOS 41 44 47 46 46  BILITOT 0.7 0.8 0.7 0.5 0.4  PROT 6.2 6.7 6.9 6.7 6.4  ALBUMIN 2.3* 2.4* 2.5* 2.6* 2.5*   No results found for this basename: LIPASE, AMYLASE,  in the last 168 hours No results found for this basename: AMMONIA,  in the last 168 hours  CBC:  Recent Labs Lab 09/18/14 1558 09/19/14 1004 09/20/14 1525 09/21/14 0347 09/22/14 0348 09/24/14 0442  WBC 16.6* 12.6* 10.7* 11.1* 9.6 10.7*  NEUTROABS 14.0*  --  8.1* 8.8*  --   --   HGB 11.5* 10.6* 10.0* 10.9* 10.6* 10.8*  HCT 31.6* 29.4* 28.7* 31.1* 30.0* 30.9*  MCV 86.6 86.5 88.3 88.6 88.8 87.8  PLT 42* 30* 39* 54* 100* 238    Cardiac Enzymes:  Recent Labs Lab 09/19/14 0455 09/19/14 1004 09/20/14 2155 09/21/14 0347 09/21/14 1142  TROPONINI 0.33* 0.32* <0.30 <0.30 <0.30   BNP (last 3  results)  Recent Labs  09/19/14 1004  PROBNP 1843.0*     CBG:  Recent Labs Lab 09/23/14 0634 09/23/14 2033 09/23/14 2332 09/24/14 0332 09/24/14 0809  GLUCAP 180* 163* 171* 215* 155*    Recent Results (from the past 240 hour(s))  MRSA PCR SCREENING     Status: None   Collection Time    09/18/14  6:13 PM      Result Value Ref Range Status   MRSA by PCR NEGATIVE  NEGATIVE Final   Comment:            The GeneXpert MRSA Assay (FDA     approved for NASAL specimens     only), is one component of a     comprehensive MRSA colonization     surveillance program. It is not     intended to diagnose MRSA     infection nor to guide or     monitor treatment for     MRSA infections.  CULTURE, RESPIRATORY (NON-EXPECTORATED)     Status: None   Collection Time    09/20/14  8:42 PM      Result Value Ref Range Status   Specimen Description TRACHEAL ASPIRATE   Final   Special Requests Normal   Final   Gram Stain     Final   Value: FEW WBC PRESENT,BOTH PMN AND MONONUCLEAR     FEW SQUAMOUS EPITHELIAL CELLS PRESENT     MODERATE GRAM NEGATIVE RODS     FEW GRAM POSITIVE COCCI     IN PAIRS   Culture     Final   Value: Non-Pathogenic Oropharyngeal-type Flora Isolated.     Performed at [facility]   Report Status 09/23/2014 FINAL   Final  CULTURE, BLOOD (ROUTINE X 2)     Status: None   Collection Time    09/20/14  9:55 PM      Result Value Ref Range Status   Specimen Description BLOOD RIGHT ARM   Final   Special Requests     Final   Value: BOTTLES DRAWN AEROBIC AND ANAEROBIC 10CC AEROBIC 5CC ANAEROBIC   Culture  Setup Time  Final   Value: 09/21/2014 03:28     Performed at [facility]   Culture     Final   Value:        BLOOD CULTURE RECEIVED NO GROWTH TO DATE CULTURE WILL BE HELD FOR 5 DAYS BEFORE ISSUING A FINAL NEGATIVE REPORT     Performed at [facility]   Report Status PENDING   Incomplete  CULTURE, BLOOD (ROUTINE X 2)     Status: None    Collection Time    09/20/14 10:05 PM      Result Value Ref Range Status   Specimen Description BLOOD RIGHT HAND   Final   Special Requests BOTTLES DRAWN AEROBIC ONLY 10CC   Final   Culture  Setup Time     Final   Value: 09/21/2014 03:29     Performed at [facility]   Culture     Final   Value:        BLOOD CULTURE RECEIVED NO GROWTH TO DATE CULTURE WILL BE HELD FOR 5 DAYS BEFORE ISSUING A FINAL NEGATIVE REPORT     Performed at [facility]   Report Status PENDING   Incomplete     Studies: Ct Abdomen Pelvis Wo Contrast  09/21/2014   CLINICAL DATA:  Evaluate for septic emboli. History of renal cell cancer, multiple myeloma.  EXAM: CT CHEST, ABDOMEN AND PELVIS WITHOUT CONTRAST  TECHNIQUE: Multidetector CT imaging of the chest, abdomen and pelvis was performed following the standard protocol without IV contrast.  COMPARISON:  CT 05/11/2014 and 04/20/2014.  FINDINGS: CT CHEST FINDINGS  Thoracic aorta normal caliber. Atherosclerotic vascular changes noted stable cardiomegaly. Coronary artery disease.  Shotty mediastinal lymph nodes.  Thoracic esophagus is unremarkable.  Endobronchial lesion is noted in the right mainstem bronchus, most likely mucous plugging. Similar findings noted in the trachea to a lesser degree. New also severe bilateral pulmonary infiltrates most consistent with pneumonia. Pulmonary edema could present this fashion. Tiny pleural effusions. No pneumothorax.  Calcified right thyroid lobe mass again noted. Chest wall is intact.  CT ABDOMEN AND PELVIS FINDINGS  Liver normal. Spleen normal. Pancreas normal. No biliary distention. The gallbladder is nondistended.  Right nephrectomy. Right surgical bed is unremarkable. No mass lesion. Left kidney is unremarkable. No hydronephrosis or obstructing ureteral stone. Foley catheter is noted in the bladder. Air is noted bladder pelvis is most likely from instrumentation. Uterus and adnexa are unremarkable.  No significant  adenopathy. Aortoiliac atherosclerotic vascular disease.  Appendiceal region is normal. There is no bowel distention. No free air. No mesenteric mass. Mild subcutaneous edema noted over the lower flanks. Developing mild anasarca could present in this fashion. Correlation to exclude cellulitis or developing hematoma suggested.  Diffuse severe thoracolumbar spine degenerative change. Sclerotic changes about the SI joints are stable.  IMPRESSION: 1. New severe bilateral pulmonary infiltrates most consistent with pneumonia. Pulmonary edema could present in this fashion. 2. Endotracheal and right endobronchial soft tissue densities, most likely mucous plugging. Possibility of a and bronchial primary or metastatic lesion cannot be excluded. 3. Right nephrectomy. Nephrectomy bed appears stable. No recurrent mass. 4. Mild subcutaneous edema noted over the lower flanks. Developing obstructive could present in this fashion. Clinical correlation is is suggested to exclude developing cellulitis or developing hematomas in these regions.   Electronically Signed   By: Maisie Fus  Register   On: 09/20/2014 21:55   Ct Head Wo Contrast  09/18/2014   CLINICAL DATA:  Code stroke, altered level of consciousness  EXAM: CT HEAD WITHOUT CONTRAST  TECHNIQUE: Contiguous axial images were obtained from the base of the skull through the vertex without intravenous contrast.  COMPARISON:  None  FINDINGS: Cavum septum pellucidum and vergae.  Otherwise normal ventricular morphology.  No midline shift.  Old high RIGHT frontal infarct.  Large acute LEFT MCA territory infarct involving the LEFT temporal and parietal lobes with effacement of sulci.  No definite dense middle cerebral artery sign.  No intracranial hemorrhage, mass lesion or extra-axial fluid collections.  Low-attenuation at posterior aspect of LEFT cerebellar hemisphere may represent a small old infarct as well.  Minimal small vessel chronic ischemic changes of deep cerebral white  matter.  No acute bone or sinus findings.  Bony excrescences are identified at the sphenoid wings bilaterally potentially developmental variants or exostoses though difficult to completely exclude calcified meningiomas, considered much less likely.  IMPRESSION: Large LEFT MCA territory infarct involving LEFT temporal and parietal lobes.  No acute intracranial hemorrhage or midline shift.  Old small high RIGHT frontal and questionable LEFT cerebellar infarcts.  Critical Value/emergent results were called by telephone at the time of interpretation on 09/18/2014 at 1556 hr to Dr. Donnetta Hutching, who verbally acknowledged these results.   Electronically Signed   By: Ulyses Southward M.D.   On: 09/18/2014 15:58   Ct Chest Wo Contrast  09/20/2014   CLINICAL DATA:  Evaluate for septic emboli. History of renal cell cancer, multiple myeloma.  EXAM: CT CHEST, ABDOMEN AND PELVIS WITHOUT CONTRAST  TECHNIQUE: Multidetector CT imaging of the chest, abdomen and pelvis was performed following the standard protocol without IV contrast.  COMPARISON:  CT 05/11/2014 and 04/20/2014.  FINDINGS: CT CHEST FINDINGS  Thoracic aorta normal caliber. Atherosclerotic vascular changes noted stable cardiomegaly. Coronary artery disease.  Shotty mediastinal lymph nodes.  Thoracic esophagus is unremarkable.  Endobronchial lesion is noted in the right mainstem bronchus, most likely mucous plugging. Similar findings noted in the trachea to a lesser degree. New also severe bilateral pulmonary infiltrates most consistent with pneumonia. Pulmonary edema could present this fashion. Tiny pleural effusions. No pneumothorax.  Calcified right thyroid lobe mass again noted. Chest wall is intact.  CT ABDOMEN AND PELVIS FINDINGS  Liver normal. Spleen normal. Pancreas normal. No biliary distention. The gallbladder is nondistended.  Right nephrectomy. Right surgical bed is unremarkable. No mass lesion. Left kidney is unremarkable. No hydronephrosis or obstructing  ureteral stone. Foley catheter is noted in the bladder. Air is noted bladder pelvis is most likely from instrumentation. Uterus and adnexa are unremarkable.  No significant adenopathy. Aortoiliac atherosclerotic vascular disease.  Appendiceal region is normal. There is no bowel distention. No free air. No mesenteric mass. Mild subcutaneous edema noted over the lower flanks. Developing mild anasarca could present in this fashion. Correlation to exclude cellulitis or developing hematoma suggested.  Diffuse severe thoracolumbar spine degenerative change. Sclerotic changes about the SI joints are stable.  IMPRESSION: 1. New severe bilateral pulmonary infiltrates most consistent with pneumonia. Pulmonary edema could present in this fashion. 2. Endotracheal and right endobronchial soft tissue densities, most likely mucous plugging. Possibility of a and bronchial primary or metastatic lesion cannot be excluded. 3. Right nephrectomy. Nephrectomy bed appears stable. No recurrent mass. 4. Mild subcutaneous edema noted over the lower flanks. Developing obstructive could present in this fashion. Clinical correlation is is suggested to exclude developing cellulitis or developing hematomas in these regions.   Electronically Signed   By: Maisie Fus  Register   On: 09/20/2014 21:55   Mr Maxine Glenn  Head Wo Contrast  09/19/2014   ADDENDUM REPORT: 09/19/2014 10:07  ADDENDUM: Study discussed by telephone with Dr. Kerry Hough On 09/19/2014 at 0958 hrs.   Electronically Signed   By: Augusto Gamble M.D.   On: 09/19/2014 10:07   09/19/2014   CLINICAL DATA:  68 year old female with left MCA infarct detected by CT presenting with code stroke, altered mental status. Initial encounter.  EXAM: MRI HEAD WITHOUT CONTRAST  MRA HEAD WITHOUT CONTRAST  TECHNIQUE: Multiplanar, multiecho pulse sequences of the brain and surrounding structures were obtained without intravenous contrast. Angiographic images of the head were obtained using MRA technique without contrast.   COMPARISON:  Head CT without contrast 09/18/2014.  FINDINGS: MRI HEAD FINDINGS  Large confluent and intensely restricted diffusion in the left hemisphere extending from the insula and operculum posteriorly, involving most of the left occipital lobe as well as the posterior temporal lobe. Small nodular areas of restricted diffusion in the left basal ganglia, which otherwise are spared. Superimposed multiple bilateral punctate and nodular foci of restricted diffusion, including involvement of the right MCA and PCA territories. Furthermore there are multiple nodular foci restricted diffusion in both cerebellar hemispheres. See series 100.  Cytotoxic edema in the affected territories. Still, intracranial mass effect is Mild at this time, with Trace rightward midline shift. No definite petechial hemorrhage.  Major intracranial vascular flow voids are preserved.  Cavum septum pellucidum. Mass effect on the left lateral ventricle. No ventriculomegaly. Basilar cisterns remain patent. Negative pituitary, cervicomedullary junction, and grossly negative visualized cervical spine. Chronic lacunar infarcts superimposed in the right corona radiata and right superior frontal gyrus subcortical white matter (with small focus of cortical encephalomalacia).  Visualized orbit soft tissues are within normal limits. Visualized paranasal sinuses and mastoids are clear. Normal bone marrow signal. No acute scalp soft tissue findings.  MRA HEAD FINDINGS  Antegrade flow in the posterior circulation with codominant distal vertebral arteries. Patent vertebrobasilar junction. Right PICA origin is patent. Left PICA less well visualized. Basilar artery irregularity without hemodynamically significant stenosis. Both PCA origins are patent. The right posterior communicating artery is present, the left is diminutive or absent. Bilateral PCA branches are within normal limits.  Antegrade flow in both ICA siphons. Bilateral siphon irregularity is  moderate. No definite hemodynamically significant ICA siphon stenosis. Patent carotid termini. Normal right MCA and bilateral ACA origins.  The left MCA is occluded in the M1 segment 10 mm beyond its origin. Paucity of flow signal only in the left MCA anterior division M2 branches.  Anterior communicating artery diminutive or absent. Visualized bilateral ACA branches are within normal limits. Right MCA M1 segment and bifurcation are patent with mild to moderate irregularity. No definite M2 branch occlusion on the right.  IMPRESSION: 1. Large acute left MCA infarct. Cytotoxic edema and mass effect but no associated hemorrhage at this time. 2. Numerous scattered small acute infarcts in the bilateral MCA, right PCA, and bilateral PICA territories. Constellation most compatible with recent embolic event (cardiac or proximal aortic source). 3. Left MCA occlusion in the M1 segment. Some reconstituted flow only in the anterior left MCA division. 4. No other major circle of Willis branch occlusion. Moderate irregularity of both ICA siphons.  Electronically Signed: By: Augusto Gamble M.D. On: 09/19/2014 09:44   Mr Brain Wo Contrast  09/19/2014   ADDENDUM REPORT: 09/19/2014 10:07  ADDENDUM: Study discussed by telephone with Dr. Kerry Hough On 09/19/2014 at 0958 hrs.   Electronically Signed   By: Augusto Gamble M.D.   On:  09/19/2014 10:07   09/19/2014   CLINICAL DATA:  68 year old female with left MCA infarct detected by CT presenting with code stroke, altered mental status. Initial encounter.  EXAM: MRI HEAD WITHOUT CONTRAST  MRA HEAD WITHOUT CONTRAST  TECHNIQUE: Multiplanar, multiecho pulse sequences of the brain and surrounding structures were obtained without intravenous contrast. Angiographic images of the head were obtained using MRA technique without contrast.  COMPARISON:  Head CT without contrast 09/18/2014.  FINDINGS: MRI HEAD FINDINGS  Large confluent and intensely restricted diffusion in the left hemisphere extending from the  insula and operculum posteriorly, involving most of the left occipital lobe as well as the posterior temporal lobe. Small nodular areas of restricted diffusion in the left basal ganglia, which otherwise are spared. Superimposed multiple bilateral punctate and nodular foci of restricted diffusion, including involvement of the right MCA and PCA territories. Furthermore there are multiple nodular foci restricted diffusion in both cerebellar hemispheres. See series 100.  Cytotoxic edema in the affected territories. Still, intracranial mass effect is Mild at this time, with Trace rightward midline shift. No definite petechial hemorrhage.  Major intracranial vascular flow voids are preserved.  Cavum septum pellucidum. Mass effect on the left lateral ventricle. No ventriculomegaly. Basilar cisterns remain patent. Negative pituitary, cervicomedullary junction, and grossly negative visualized cervical spine. Chronic lacunar infarcts superimposed in the right corona radiata and right superior frontal gyrus subcortical white matter (with small focus of cortical encephalomalacia).  Visualized orbit soft tissues are within normal limits. Visualized paranasal sinuses and mastoids are clear. Normal bone marrow signal. No acute scalp soft tissue findings.  MRA HEAD FINDINGS  Antegrade flow in the posterior circulation with codominant distal vertebral arteries. Patent vertebrobasilar junction. Right PICA origin is patent. Left PICA less well visualized. Basilar artery irregularity without hemodynamically significant stenosis. Both PCA origins are patent. The right posterior communicating artery is present, the left is diminutive or absent. Bilateral PCA branches are within normal limits.  Antegrade flow in both ICA siphons. Bilateral siphon irregularity is moderate. No definite hemodynamically significant ICA siphon stenosis. Patent carotid termini. Normal right MCA and bilateral ACA origins.  The left MCA is occluded in the M1  segment 10 mm beyond its origin. Paucity of flow signal only in the left MCA anterior division M2 branches.  Anterior communicating artery diminutive or absent. Visualized bilateral ACA branches are within normal limits. Right MCA M1 segment and bifurcation are patent with mild to moderate irregularity. No definite M2 branch occlusion on the right.  IMPRESSION: 1. Large acute left MCA infarct. Cytotoxic edema and mass effect but no associated hemorrhage at this time. 2. Numerous scattered small acute infarcts in the bilateral MCA, right PCA, and bilateral PICA territories. Constellation most compatible with recent embolic event (cardiac or proximal aortic source). 3. Left MCA occlusion in the M1 segment. Some reconstituted flow only in the anterior left MCA division. 4. No other major circle of Willis branch occlusion. Moderate irregularity of both ICA siphons.  Electronically Signed: By: Augusto Gamble M.D. On: 09/19/2014 09:44   US Soft Tissue Head/neck  08/31/2014   CLINICAL DATA:  thyroid nodule. FNA biopsy of dominant right nodule 09/07/2013.  EXAM: THYROID ULTRASOUND  TECHNIQUE: Ultrasound examination of the thyroid gland and adjacent soft tissues was performed.  COMPARISON:  08/04/2013  FINDINGS: Right thyroid lobe  Measurements: 50 x 23 x 32 mm. Dominant 36 x 20 x 25 mm complex solid nodule, mid lobe (previously 19 x 26 x 29). Adjacent 9 x 6 mm complex  cyst at its inferior margin (previously 6 x 8 x 9).  Left thyroid lobe  Measurements: 48 x 18 x 27 mm. Markedly inhomogeneous background echotexture. Poorly marginated 14 x 7 x 10 mm hypoechoic lesion, mid lobe (previously 7 x 9 x 8). 8 x 6 x 9 mm complex cyst, inferior pole.  Isthmus  Thickness: 3 mm.  No nodules visualized.  Lymphadenopathy  None visualized.  IMPRESSION: 1. Thyromegaly with bilateral nodules. Enlargement of dominant right lesion; correlate with previous biopsy results. No other lesion meets current consensus criteria for biopsy. Follow-up by  clinical exam is recommended. If patient has known risk factors for thyroid carcinoma, consider follow-up ultrasound in 12 months. If patient is clinically hyperthyroid, consider nuclear medicine thyroid uptake and scan. This recommendation follows the consensus statement: Management of Thyroid Nodules Detected as [place]: Society of Radiologists in Ultrasound Consensus Conference Statement. Radiology 2005; [id number].   Electronically Signed   By: Oley Balm M.D.   On: 08/31/2014 14:30   Dg Chest Port 1 View  09/21/2014   CLINICAL DATA:  68 year old female with shortness of breath. Left MCA infarct, aspiration pneumonia.  EXAM: PORTABLE CHEST - 1 VIEW  COMPARISON:  09/18/2014 and prior chest radiographs dating back to 05/08/2000  FINDINGS: Cardiomegaly again noted.  Elevation of the right hemidiaphragm and bibasilar atelectasis again noted.  Pulmonary vascular congestion again identified.  There has been interval improvement in left lung aeration.  There is no evidence of pneumothorax.  No other significant change identified.  IMPRESSION: Improved left lung aeration without other significant change.   Electronically Signed   By: Laveda Abbe M.D.   On: 09/21/2014 08:15   Dg Chest Portable 1 View  09/18/2014   CLINICAL DATA:  Stroke.  EXAM: PORTABLE CHEST - 1 VIEW  COMPARISON:  Chest CT 05/11/2014  FINDINGS: Single view of the chest demonstrates low lung volumes. Patchy airspace densities in the left lung. There may be subtle airspace disease in the right lung. Heart size is accentuated by the low lung volumes.  IMPRESSION: Patchy airspace disease, particularly in the left lung. Findings are concerning for pneumonia or aspiration. Asymmetric pulmonary edema is also in the differential diagnosis.   Electronically Signed   By: Richarda Overlie M.D.   On: 09/18/2014 17:10   Dg Abd Portable 1v  09/23/2014   CLINICAL DATA:  Nasogastric tube placement.  EXAM: PORTABLE ABDOMEN - 1 VIEW  COMPARISON:  None.  FINDINGS:  Feeding tube tip terminates just beyond the gastroesophageal junction. Bowel gas pattern is unremarkable. Basilar lung opacities are incidentally imaged.  IMPRESSION: Feeding tube tip terminates just beyond the gastroesophageal junction.   Electronically Signed   By: Leanna Battles M.D.   On: 09/23/2014 18:47    Scheduled Meds: . ampicillin-sulbactam (UNASYN) IV  3 g Intravenous Q6H  . aspirin  300 mg Rectal Daily   Or  . aspirin  325 mg Oral Daily  . fluticasone  2 spray Each Nare Daily  . insulin aspart  0-9 Units Subcutaneous 6 times per day  . ipratropium-albuterol  3 mL Nebulization BID  . levothyroxine  50 mcg Intravenous Daily  . methylPREDNISolone (SOLU-MEDROL) injection  60 mg Intravenous Q12H  . pantoprazole (PROTONIX) IV  40 mg Intravenous Q12H   Continuous Infusions: . sodium chloride 100 mL/hr at 09/24/14 0849  . feeding supplement (GLUCERNA 1.2 CAL) 1,000 mL (09/24/14 0530)    Principal Problem:   CVA (cerebral infarction) Active Problems:   Aspiration pneumonia  Thrombocytopenia, unspecified   Elevated troponin   CKD (chronic kidney disease) stage 3, GFR 30-59 ml/min    Time spent: 40 minutes   [facility]  [facility] Pager [phone]. If 7PM-7AM, please contact night-coverage at [email or web address], password [facility] 09/24/2014, 10:25 AM  LOS: 6 days

## 2014-09-24 NOTE — Progress Notes (Signed)
Pt observed to be restless with arm's swinging intermittently and heavy breathing, temp read 97.8 and BP 167/65, 100% oxygen saturation on RA,, Rapid respond RN paged and notified, said to be tied up in another case, NP Kathline Magic also paged, advised to give tylenol 650mg  for pain same given at 0604 per the NG Tube, pt cleaned up and repositioned in bed, will continue to monitor. Obasogie-Asidi, Ammi Hutt Efe

## 2014-09-24 NOTE — Progress Notes (Signed)
Patient ID: Melissa [name], female   DOB: 12-18-1946, 68 y.o.   MRN: [id number]      Subjective:    No events overnight   Objective:   Temp:  [97.4 F (36.3 C)-98.7 F (37.1 C)] 97.4 F (36.3 C) (10/03 0938) Pulse Rate:  [76-100] 77 (10/03 0938) Resp:  [13-24] 20 (10/03 0938) BP: (134-216)/(50-98) 156/64 mmHg (10/03 0938) SpO2:  [97 %-100 %] 100 % (10/03 0938) Weight:  [233 lb 4 oz (105.8 kg)] 233 lb 4 oz (105.8 kg) (10/03 0108)    Filed Weights   09/19/14 1900 09/24/14 0108  Weight: 237 lb 10.5 oz (107.8 kg) 233 lb 4 oz (105.8 kg)    Intake/Output Summary (Last 24 hours) at 09/24/14 0944 Last data filed at 09/23/14 1714  Gross per 24 hour  Intake    100 ml  Output      0 ml  Net    100 ml    Telemetry: SR, PVCs  Exam:  General: NAD  Resp: lung clear anteriorally  Cardiac: RRR, no m/r/g, no JVD  GI: abdomen soft, NT, ND  MSK: no LE edema   Lab Results:  Basic Metabolic Panel:  Recent Labs Lab 09/20/14 1525 09/20/14 2155 09/21/14 0347 09/22/14 0348 09/23/14 0529 09/24/14 0442  NA 141  --  143 146 146 150*  K 3.6*  --  4.2 3.9 3.7 3.8  CL 103  --  105 107 108 114*  CO2 24  --  23 24 24 24   GLUCOSE 94  --  120* 162* 197* 225*  BUN 25*  --  29* 39* 52* 59*  CREATININE 1.59*  --  1.71* 1.76* 1.72* 1.76*  CALCIUM 8.8  --  9.0 9.5 9.6 9.3  MG 2.2 2.2 2.3  --   --   --     Liver Function Tests:  Recent Labs Lab 09/22/14 0348 09/23/14 0529 09/24/14 0442  AST 25 12 9   ALT 62* 44* 33  ALKPHOS 47 46 46  BILITOT 0.7 0.5 0.4  PROT 6.9 6.7 6.4  ALBUMIN 2.5* 2.6* 2.5*    CBC:  Recent Labs Lab 09/21/14 0347 09/22/14 0348 09/24/14 0442  WBC 11.1* 9.6 10.7*  HGB 10.9* 10.6* 10.8*  HCT 31.1* 30.0* 30.9*  MCV 88.6 88.8 87.8  PLT 54* 100* 238    Cardiac Enzymes:  Recent Labs Lab 09/20/14 2155 09/21/14 0347 09/21/14 1142  TROPONINI <0.30 <0.30 <0.30    BNP:  Recent Labs  09/19/14 1004  PROBNP 1843.0*     Coagulation:  Recent Labs Lab 09/18/14 1558  INR 1.13    ECG:   Medications:   Scheduled Medications: . ampicillin-sulbactam (UNASYN) IV  3 g Intravenous Q6H  . aspirin  300 mg Rectal Daily   Or  . aspirin  325 mg Oral Daily  . fluticasone  2 spray Each Nare Daily  . insulin aspart  0-9 Units Subcutaneous 6 times per day  . ipratropium-albuterol  3 mL Nebulization BID  . levothyroxine  50 mcg Intravenous Daily  . methylPREDNISolone (SOLU-MEDROL) injection  60 mg Intravenous Q12H  . pantoprazole (PROTONIX) IV  40 mg Intravenous Q12H     Infusions: . sodium chloride 100 mL/hr at 09/24/14 0849  . feeding supplement (GLUCERNA 1.2 CAL) 1,000 mL (09/24/14 0530)     PRN Medications:  acetaminophen, albuterol, hydrALAZINE   09/23/14 TEE Study Conclusions  - Left ventricle: There was mild concentric hypertrophy. Systolic function was vigorous. The estimated ejection fraction was  in the range of 65% to 70%. Wall motion was normal; there were no regional wall motion abnormalities. - Aortic valve: No evidence of vegetation. No evidence of vegetation. - Mitral valve: No evidence of vegetation. No evidence of vegetation. There was mild to moderate regurgitation directed centrally. - Left atrium: No evidence of thrombus in the atrial cavity or appendage. No spontaneous echo contrast was observed. - Right atrium: No evidence of thrombus in the atrial cavity or appendage. No evidence of thrombus in the atrial cavity or appendage. - Atrial septum: There was a patent foramen ovale. There was a moderate right-to-left atrial level shunt, in the baseline state. There was redundancy of the septum, with borderline criteria for aneurysm. - Tricuspid valve: No evidence of vegetation. - Pulmonic valve: No evidence of vegetation.  Impressions:  - Consider possible paradoxical embolism as cause of ischemic stroke. Evaluate for lower extremity DVT.    Assessment/Plan     1. CVA - acute large left MCA infarct - TEE without evidence of intracardiac thrombus. There was a PFO, LE venous US negative for DVTs -  - being considered for LINQ loop recorder by EP, will follow further recs and possible timing of implant on Monday. Continue to follow telemetry while in hospital - post CVA, start high dose statin once taking PO. Continue ASA suppository for secondary prevention      Dina Rich, M.D.

## 2014-09-24 NOTE — Progress Notes (Signed)
STROKE TEAM PROGRESS NOTE   HISTORY Melissa [name] is an 68 y.o. female with a past medical history significant for HTN, hyperlipidemia, renal cell carcinoma s/p right nephrectomy, multiple myeloma on chemotherapy, transferred to [facility] for further stroke management. Patient has altered mental status and seems to be aphasic, family is unavailable, and thus all clinical information was obtained from the chart. She was initially evaluated at [facility] where notes indicated that " she was brought to the hospital by EMS due to altered mental status. The patient was found by her daughter at approximately 2 PM. She had fallen on the ground at an unknown time and was found on the floor by her daughter. She was altered at that point and had weakness of her right side including right facial droop and was not verbalizing. She was last known to be well last night. Code stroke was called alone neurology determined not to proceed with TPA due to unknown time of onset". At time of consult, she is lethargic but open eyes on verbal commands. On aspirin. last known well unknown.   MRI/MRA brain" large acute left MCA infarct. Cytotoxic edema and mass effect but no associated hemorrhage at this time. Numerous scattered small acute infarcts in the bilateral MCA, right PCA, and bilateral PICA territories.  Constellation most compatible with recent embolic event (cardiac or proximal aortic source). Left MCA occlusion in the M1 segment. Some reconstituted flow only in the anterior left MCA division.   Patient was not administered TPA secondary to delay in arrival. She was admitted to a step down unit for further evaluation and treatment.   SUBJECTIVE (INTERVAL HISTORY) Pt is more awake alert than yesterday but still global aphasia and right hemiplegia. TEE done today showed PFO. Pt had PANDA tube insertion for tube feeding.daughter at bedside   OBJECTIVE Temp:  [97.4 F (36.3 C)-98.7 F (37.1 C)] 97.5 F (36.4 C) (10/03  1508) Pulse Rate:  [76-85] 76 (10/03 1508) Cardiac Rhythm:  [-] Normal sinus rhythm (10/03 0408) Resp:  [18-20] 20 (10/03 1508) BP: (134-185)/(50-70) 185/67 mmHg (10/03 1508) SpO2:  [100 %] 100 % (10/03 1508) Weight:  [233 lb 4 oz (105.8 kg)] 233 lb 4 oz (105.8 kg) (10/03 0108)   Recent Labs Lab 09/23/14 2033 09/23/14 2332 09/24/14 0332 09/24/14 0809 09/24/14 1203  GLUCAP 163* 171* 215* 155* 184*    Recent Labs Lab 09/20/14 1525 09/20/14 2155 09/21/14 0347 09/22/14 0348 09/23/14 0529 09/24/14 0442  NA 141  --  143 146 146 150*  K 3.6*  --  4.2 3.9 3.7 3.8  CL 103  --  105 107 108 114*  CO2 24  --  _0 GLUCOSE 94  --  120* 162* 197* 225*  BUN 25*  --  29* 39* 52* 59*  CREATININE 1.59*  --  1.71* 1.76* 1.72* 1.76*  CALCIUM 8.8  --  9.0 9.5 9.6 9.3  MG 2.2 2.2 2.3  --   --   --     Recent Labs Lab 09/20/14 1525 09/21/14 0347 09/22/14 0348 09/23/14 0529 09/24/14 0442  AST 28 51* _1 ALT 29 60* 62* 44* 33  ALKPHOS 41 44 47 46 46  BILITOT 0.7 0.8 0.7 0.5 0.4  PROT 6.2 6.7 6.9 6.7 6.4  ALBUMIN 2.3* 2.4* 2.5* 2.6* 2.5*    Recent Labs Lab 09/18/14 1558 09/19/14 1004 09/20/14 1525 09/21/14 0347 09/22/14 0348 09/24/14 0442  WBC 16.6* 12.6* 10.7* 11.1* 9.6 10.7*  NEUTROABS 14.0*  --  8.1* 8.8*  --   --   HGB 11.5* 10.6* 10.0* 10.9* 10.6* 10.8*  HCT 31.6* 29.4* 28.7* 31.1* 30.0* 30.9*  MCV 86.6 86.5 88.3 88.6 88.8 87.8  PLT 42* 30* 39* 54* 100* 238    Recent Labs Lab 09/19/14 0455 09/19/14 1004 09/20/14 2155 09/21/14 0347 09/21/14 1142  TROPONINI 0.33* 0.32* <0.30 <0.30 <0.30   No results found for this basename: LABPROT, INR,  in the last 72 hours No results found for this basename: COLORURINE, APPERANCEUR, LABSPEC, [place], GLUCOSEU, HGBUR, BILIRUBINUR, KETONESUR, PROTEINUR, UROBILINOGEN, NITRITE, LEUKOCYTESUR,  in the last 72 hours     Component Value Date/Time   CHOL 171 09/19/2014 0455   TRIG 182* 09/19/2014 0455   HDL 79  09/19/2014 0455   CHOLHDL 2.2 09/19/2014 0455   VLDL 36 09/19/2014 0455   LDLCALC 56 09/19/2014 0455   Lab Results  Component Value Date   HGBA1C 6.4* 09/19/2014   No results found for this basename: labopia,  cocainscrnur,  labbenz,  amphetmu,  thcu,  labbarb    No results found for this basename: ETH,  in the last 168 hours  Ct Head Wo Contrast 09/18/2014   Large LEFT MCA territory infarct involving LEFT temporal and parietal lobes.  No acute intracranial hemorrhage or midline shift.  Old small high RIGHT frontal and questionable LEFT cerebellar infarcts.    Mr Brain Wo Contrast 09/19/2014   1. Large acute left MCA infarct. Cytotoxic edema and mass effect but no associated hemorrhage at this time. 2. Numerous scattered small acute infarcts in the bilateral MCA, right PCA, and bilateral PICA territories. Constellation most compatible with recent embolic event (cardiac or proximal aortic source).   Mr Jodene Nam Head Wo Contrast 09/19/2014   3. Left MCA occlusion in the M1 segment. Some reconstituted flow only in the anterior left MCA division. 4. No other major circle of Willis branch occlusion. Moderate irregularity of both ICA siphons.    Dg Chest Portable 1 View 09/18/2014    Patchy airspace disease, particularly in the left lung. Findings are concerning for pneumonia or aspiration. Asymmetric pulmonary edema is also in the differential diagnosis.     2D Echocardiogram  - Mild LVH with LVEF 96-78%, grade 1 diastolic dysfunction. Mild mitral regurgitation. Mildly dilated RV with normal contraction. Trivial tricuspid regurgitation, PASP 52 mm mercury. No definitive PFO or ASD. No pericardial effusion. No source of embolus.   Carotid Doppler  No evidence of hemodynamically significant internal carotid artery stenosis - 1-39% internal carotid artery stenosis bilaterally. Vertebral artery flow is antegrade.   Ct Abdomen Pelvis Chest Wo Contrast 09/21/2014    1. New severe bilateral pulmonary infiltrates  most consistent with pneumonia. Pulmonary edema could present in this fashion. 2. Endotracheal and right endobronchial soft tissue densities, most likely mucous plugging. Possibility of a and bronchial primary or metastatic lesion cannot be excluded. 3. Right nephrectomy. Nephrectomy bed appears stable. No recurrent mass. 4. Mild subcutaneous edema noted over the lower flanks. Developing obstructive could present in this fashion. Clinical correlation is is suggested to exclude developing cellulitis or developing hematomas in these regions.     Venous doppler LEs - - No evidence of deep vein or superficial thrombosis involving the right lower extremity and left lower extremity. - No evidence of Baker&'s cyst on the right or left.   PHYSICAL EXAM Temp:  [97.4 F (36.3 C)-98.7 F (37.1 C)] 97.5 F (36.4 C) (10/03 1508) Pulse Rate:  [76-85] 76 (  10/03 1508) Resp:  [18-20] 20 (10/03 1508) BP: (134-185)/(50-70) 185/67 mmHg (10/03 1508) SpO2:  [100 %] 100 % (10/03 1508) Weight:  [233 lb 4 oz (105.8 kg)] 233 lb 4 oz (105.8 kg) (10/03 0108)  General - obese, well developed, not in acute distress.  Ophthalmologic - not able to see through.  Cardiovascular - Regular rate and rhythm with no murmur.  Mental Status -  Awake, alert, but global aphasia, not following commands.  Cranial Nerves II - XII - II - Visual neglect on the right side. III, IV, VI - Extraocular movements showed left side eye gaze V - Facial sensation not able to test. VII - right facial droop. VIII - not able to test but track for voice. X - not cooperative. XI - not cooperative XII - Tongue in middle inside mouth.  Motor Strength - The patient's strength was hemiplegic on the right. RLE withdraw to pain but LUE and LLE spontaneous movement.  Bulk was normal and fasciculations were absent.   Motor Tone - Muscle tone was assessed at the neck and appendages and was normal.  Reflexes - The patient's reflexes were normal in  all extremities and she had bilateral babinski.  Sensory - respond to pain stimulation.    Coordination - not able to test.  Gait and Station - not tested.   ASSESSMENT/PLAN Melissa [name] is a 68 y.o. female with history of hypertension, hyperlipidemia, thyroid disease with increased size (bx held given dx multiple myeloma), renal cell cancer s/p nephrectomy and no recurrent at surgical bed, and recently diagnosed multiple myeloma on chemo presenting with sudden onset right hemiparesis. She did not receive IV t-PA due to delay in arrival. MRI imaging confirms a left MCA infarct, as well as numerous scattered small acute infarcts in the bilateral MCA, right PCA, and bilateral PICA territories, consistent with embolic most likely secondary to either cardioembolic or hypercoagulable state from malignancy or chemo drug - induced (Revlimid).   Stroke:  Large left MCA infarct in setting of L MCA M1 occlusion, and numerous scattered small acute infarcts in the bilateral MCA, right PCA, and bilateral PICA territories, Consistent with embolic most likely secondary to either cardioembolic or hypercoagulable state from malignancy or chemo meds.    MRI  Large lefe MCA, but also Numerous scattered small acute infarcts in the bilateral MCA, right PCA, and bilateral PICA territories   MRA  L MCA M1 occlusion  Carotid Doppler  No significant stenosis   2D Echo  No source of embolus  TEE showed PFO but no other cardiac source of emboli, on antiplatelet.  LE venous doppler - negative   If no afib found on tele on discharge, may need to consider loop recorder placement.  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  presented with LBP and concerning for MM metastasis to spine??  thyroid nodule enlargement as per family, bx on hold given acute cancer treatment  CT chest R lung  mucous plugging vs other etiology  Dr. Farrel Gobble - on Revlimid, known to have risk of PNA and stroke   Hypertension   Permissive hypertension <220/120 for 24-48 hours and then gradually normalize within 5-7 days  BP goal long term normotensive  Norvasc, lasix prior to admission BP 133-166/50-60 past 24h (09/24/2014 @ 3:36 PM)  Stable  Hyperlipidemia  Home meds:  mevacor 40, not resumed in hospital due to NPO status  LDL 56, on the goal <70 for diabetics  Continue statin at discharge  Other Stroke Risk Factors Advanced age   Morbid obesity, Body mass index is 38.81 kg/(m^2).   Other Active Problems  Aspiration pnemonia, placed on clindamycin  Thrombocythemia, due to chemo   Elevated troponin, thought to be due to large stroke. Cardiology consulted.  Hypothyroid, enlarged lobe, bx on hold given acute cancer treatment  Depression, on treatment  COPD, on treatment  CHF, pulmonary hypertension   Hypokalemia, resolved. K 3.9  hypothyroid  Other Pertinent History  CKD  Allergic rhinitis Stroke Team will sign off . Call for questions. Hospital day # [place], MD Stroke Neurology 09/24/2014 3:36 PM   To contact Stroke Continuity provider, please refer to [email or web address] After hours, contact General Neurology

## 2014-09-24 NOTE — Progress Notes (Signed)
Patient ID: Melissa [name] Levels, female   DOB: [birth year], 68 y.o.   MRN: [id number] Aware of request for gastrostomy tube placement in pt. Imaging studies were reviewed. Hx noted. Details/risks of procedure d/w pt's daughter. She states family would prefer to wait until after further evaluation by speech therapy on 10/5  before deciding to proceed with G tube. Will cont to monitor.

## 2014-09-24 NOTE — Progress Notes (Signed)
Pt Pulled out her Panda tube this morning at 0650, tube feed was infusing at 31ml/hr, Dr Allyson Sabal paged and notified, same reported to the day shift RN. Obasogie-Asidi, Antaeus Karel Efe

## 2014-09-25 DIAGNOSIS — Z515 Encounter for palliative care: Secondary | ICD-10-CM

## 2014-09-25 DIAGNOSIS — R451 Restlessness and agitation: Secondary | ICD-10-CM

## 2014-09-25 LAB — COMPREHENSIVE METABOLIC PANEL
ALT: 29 U/L (ref 0–35)
AST: 13 U/L (ref 0–37)
Albumin: 2.5 g/dL — ABNORMAL LOW (ref 3.5–5.2)
Alkaline Phosphatase: 49 U/L (ref 39–117)
Anion gap: 13 (ref 5–15)
BUN: 58 mg/dL — ABNORMAL HIGH (ref 6–23)
CALCIUM: 9.4 mg/dL (ref 8.4–10.5)
CO2: 23 meq/L (ref 19–32)
CREATININE: 1.63 mg/dL — AB (ref 0.50–1.10)
Chloride: 116 mEq/L — ABNORMAL HIGH (ref 96–112)
GFR, EST AFRICAN AMERICAN: 36 mL/min — AB (ref >90)
GFR, EST NON AFRICAN AMERICAN: 31 mL/min — AB (ref >90)
GLUCOSE: 203 mg/dL — AB (ref 70–99)
Potassium: 4.3 mEq/L (ref 3.7–5.3)
SODIUM: 152 meq/L — AB (ref 137–147)
Total Bilirubin: 0.5 mg/dL (ref 0.3–1.2)
Total Protein: 6.2 g/dL (ref 6.0–8.3)

## 2014-09-25 LAB — GLUCOSE, CAPILLARY
GLUCOSE-CAPILLARY: 171 mg/dL — AB (ref 70–99)
GLUCOSE-CAPILLARY: 205 mg/dL — AB (ref 70–99)
GLUCOSE-CAPILLARY: 182 mg/dL — AB (ref 70–99)
Glucose-Capillary: 147 mg/dL — ABNORMAL HIGH (ref 70–99)
Glucose-Capillary: 182 mg/dL — ABNORMAL HIGH (ref 70–99)
Glucose-Capillary: 193 mg/dL — ABNORMAL HIGH (ref 70–99)
Glucose-Capillary: 218 mg/dL — ABNORMAL HIGH (ref 70–99)

## 2014-09-25 MED ORDER — CHLORHEXIDINE GLUCONATE 0.12 % MT SOLN
15.0000 mL | Freq: Two times a day (BID) | OROMUCOSAL | Status: DC
  Administered 2014-09-25 – 2014-10-01 (×12): 15 mL via OROMUCOSAL
  Filled 2014-09-25 (×8): qty 15

## 2014-09-25 MED ORDER — FREE WATER
100.0000 mL | Status: DC
  Administered 2014-09-25 – 2014-10-01 (×25): 100 mL

## 2014-09-25 MED ORDER — DEXTROSE 5 % IV SOLN
INTRAVENOUS | Status: DC
  Administered 2014-09-25 – 2014-09-28 (×7): via INTRAVENOUS

## 2014-09-25 MED ORDER — HYDRALAZINE HCL 20 MG/ML IJ SOLN
20.0000 mg | Freq: Four times a day (QID) | INTRAMUSCULAR | Status: DC | PRN
  Administered 2014-09-25 – 2014-09-29 (×6): 20 mg via INTRAVENOUS
  Filled 2014-09-25 (×6): qty 1

## 2014-09-25 MED ORDER — CLONIDINE HCL 0.2 MG/24HR TD PTWK
0.2000 mg | MEDICATED_PATCH | TRANSDERMAL | Status: DC
  Administered 2014-09-25: 0.2 mg via TRANSDERMAL
  Filled 2014-09-25: qty 1

## 2014-09-25 MED ORDER — CETYLPYRIDINIUM CHLORIDE 0.05 % MT LIQD
7.0000 mL | Freq: Two times a day (BID) | OROMUCOSAL | Status: DC
  Administered 2014-09-25 – 2014-10-01 (×12): 7 mL via OROMUCOSAL

## 2014-09-25 NOTE — Consult Note (Signed)
Patient Melissa [name]      DOB: 07-10-1946      ZDG:[id number]     Consult Note from the Palliative Medicine Team at [facility]    Consult Requested by: Dr Susie Cassette    PCP: [name], MD Reason for Consultation:Goals of Care     Phone [phone]  Assessment/Recommendations:  68 yo female with PMHx of CVA, RCC s/p nephrectomy, diastolic HF, multiple myeloma who presented with acute left MCA stroke.    1.  Code Status: Full  2. Goals of Care: Spoke with daughter Melissa [name] via phone today. She has fairly good understanding of clinical situation related to CVA.  She has concerns over feeding tube and feels pressure to make decision> Speech eval states that she has made progress in swallowing recovery and will re-eval tomorrow. I would recommend not making decision on PEG until after this evaluation to see if she could avoid this intervention.  Will meet with daughter tomorrow afternoon around 2-3PM to discuss further. Hopefully will have speech results.  Will also see about larger goals of care given her multiple myeloma. I did not get a great feel from phone conversation about her readiness to address global goals given her multiple CVA's and multiple myeloma history.    3. Symptom Management:   1. Anxiety/Agitation: Worsened today per nursing. Likely neurologic injury is etiologyNeeding restraints. If pharmacologic intervention needed, would recommend avoiding benzo's and using haldol PRN.  EKG here does not show any QTc prolongation.   4. Psychosocial/Spiritual: 2 daughters Melissa [name] and Melissa [name] very involved in her care.     Brief HPI:  Patient is a 68 yo female with PMHx of CVA's, diastolic HF, RCC s/p nephrectomy, recent diagnosis of IgG lambda multiple myeloma recently treated with Velcade on 9/24. She presented from home after being found down and altered by her daughter.  She was found to have a large L MCA stroke. Her hospital stay has been complicated by aspiration,  PNA, dysphagia, hypernatremia.  She is unable to provide any history.     ROS: Unable to obtain 2/2 cognitive impairment.      PMH:  Past Medical History  Diagnosis Date  . Vertigo, intermittent   . Hypothyroidism   . Hyperlipidemia   . Obesity   . Hypertension     severe and resistant to treatment; 2008-negative left renal angiogram  . Vitiligo   . Degenerative disc disease, lumbar   . Degenerative disc disease, cervical   . Chest pain 2008    2008-normal coronary angiography  . Renal cell carcinoma     Right nephrectomy     PSH: Past Surgical History  Procedure Laterality Date  . Tubal ligation  1983    Bilateral  . Nephrectomy      Right; secondary to renal cell carcinoma  . Colonoscopy N/A 04/21/2014    Procedure: COLONOSCOPY;  Surgeon: Malissa Hippo, MD;  Location: [facility];  Service: Endoscopy;  Laterality: N/A;  . Colonoscopy N/A 04/21/2014    Procedure: COLONOSCOPY;  Surgeon: Malissa Hippo, MD;  Location: [facility];  Service: Endoscopy;  Laterality: N/A;  1030   I have reviewed the FH and SH and  If appropriate update it with new information. Allergies  Allergen Reactions  . Morphine Nausea And Vomiting   Scheduled Meds: . ampicillin-sulbactam (UNASYN) IV  3 g Intravenous Q6H  . antiseptic oral rinse  7 mL Mouth Rinse q12n4p  . aspirin  300 mg Rectal Daily  Or  . aspirin  325 mg Oral Daily  . carvedilol  6.25 mg Oral BID WC  . chlorhexidine  15 mL Mouth Rinse BID  . cloNIDine  0.2 mg Transdermal Weekly  . fluticasone  2 spray Each Nare Daily  . free water  100 mL Per Tube 6 times per day  . insulin aspart  0-9 Units Subcutaneous 6 times per day  . ipratropium-albuterol  3 mL Nebulization BID  . levothyroxine  50 mcg Intravenous Daily  . methylPREDNISolone (SOLU-MEDROL) injection  40 mg Intravenous Q12H  . pantoprazole (PROTONIX) IV  40 mg Intravenous Q12H   Continuous Infusions: . dextrose 75 mL/hr at 09/25/14 0849  . feeding  supplement (GLUCERNA 1.2 CAL) 1,000 mL (09/25/14 0930)   PRN Meds:.acetaminophen, albuterol, hydrALAZINE    BP 161/68  Pulse 86  Temp(Src) 98 F (36.7 C) (Oral)  Resp 18  Ht 5\' 5"  (1.651 m)  Wt 109 kg (240 lb 4.8 oz)  BMI 39.99 kg/m2  SpO2 100%   PPS: 30   Intake/Output Summary (Last 24 hours) at 09/25/14 1911 Last data filed at 09/25/14 1700  Gross per 24 hour  Intake    200 ml  Output      0 ml  Net    200 ml    Physical Exam:  General: Alert, Agitated HEENT: [place], PERRL, sclera anicteric Chest:   CTAB CVS: RRR Abdomen: soft, NT Ext: no edema Skin: warm/dry Neuro: right sided weakness, nonverbal  Labs: CBC    Component Value Date/Time   WBC 10.7* 09/24/2014 0442   RBC 3.52* 09/24/2014 0442   RBC 3.70* 07/31/2011 1245   HGB 10.8* 09/24/2014 0442   HCT 30.9* 09/24/2014 0442   PLT 238 09/24/2014 0442   MCV 87.8 09/24/2014 0442   MCH 30.7 09/24/2014 0442   MCHC 35.0 09/24/2014 0442   RDW 16.6* 09/24/2014 0442   LYMPHSABS 1.2 09/21/2014 0347   MONOABS 1.0 09/21/2014 0347   EOSABS 0.1 09/21/2014 0347   BASOSABS 0.0 09/21/2014 0347    BMET    Component Value Date/Time   NA 152* 09/25/2014 0422   K 4.3 09/25/2014 0422   CL 116* 09/25/2014 0422   CO2 23 09/25/2014 0422   GLUCOSE 203* 09/25/2014 0422   BUN 58* 09/25/2014 0422   CREATININE 1.63* 09/25/2014 0422   CREATININE 1.91* 07/21/2014 1103   CALCIUM 9.4 09/25/2014 0422   GFRNONAA 31* 09/25/2014 0422   GFRNONAA 36* 03/10/2014 1005   GFRAA 36* 09/25/2014 0422   GFRAA 42* 03/10/2014 1005    CMP     Component Value Date/Time   NA 152* 09/25/2014 0422   K 4.3 09/25/2014 0422   CL 116* 09/25/2014 0422   CO2 23 09/25/2014 0422   GLUCOSE 203* 09/25/2014 0422   BUN 58* 09/25/2014 0422   CREATININE 1.63* 09/25/2014 0422   CREATININE 1.91* 07/21/2014 1103   CALCIUM 9.4 09/25/2014 0422   PROT 6.2 09/25/2014 0422   ALBUMIN 2.5* 09/25/2014 0422   AST 13 09/25/2014 0422   ALT 29 09/25/2014 0422   ALKPHOS 49 09/25/2014 0422   BILITOT  0.5 09/25/2014 0422   GFRNONAA 31* 09/25/2014 0422   GFRNONAA 36* 03/10/2014 1005   GFRAA 36* 09/25/2014 0422   GFRAA 42* 03/10/2014 1005   9/28 MRI Brain IMPRESSION:  1. Large acute left MCA infarct. Cytotoxic edema and mass effect but  no associated hemorrhage at this time.  2. Numerous scattered small acute infarcts in the bilateral MCA,  right  PCA, and bilateral PICA territories. Constellation most  compatible with recent embolic event (cardiac or proximal aortic  source).  3. Left MCA occlusion in the M1 segment. Some reconstituted flow  only in the anterior left MCA division.  4. No other major circle of Willis branch occlusion. Moderate  irregularity of both ICA siphons.   9/29 CT Chest/Abd/Pelvis IMPRESSION:  1. New severe bilateral pulmonary infiltrates most consistent with  pneumonia. Pulmonary edema could present in this fashion.  2. Endotracheal and right endobronchial soft tissue densities, most  likely mucous plugging. Possibility of a and bronchial primary or  metastatic lesion cannot be excluded.  3. Right nephrectomy. Nephrectomy bed appears stable. No recurrent  mass.  4. Mild subcutaneous edema noted over the lower flanks. Developing  obstructive could present in this fashion. Clinical correlation is  is suggested to exclude developing cellulitis or developing  hematomas in these regions.  10/2 TEE No thrombus, PFO  10/2 Lower ext DVT negative for thrombosis   Total Time: 50 minutes  Greater than 50%  of this time was spent counseling and coordinating care related to the above assessment and plan.  Orvis Brill D.O. Palliative Medicine Team at [facility]  Pager: [phone] Team Phone: [phone]

## 2014-09-25 NOTE — Progress Notes (Addendum)
[facility] PROGRESS NOTE  Raimey Whitefield Fusilier QMV:[id number] DOB: 06/26/1946 DOA: 09/18/2014 PCP: [name], MD  Assessment/Plan: Principal Problem:   CVA (cerebral infarction) Active Problems:   Aspiration pneumonia   Thrombocytopenia, unspecified   Elevated troponin   CKD (chronic kidney disease) stage 3, GFR 30-59 ml/min   Assessment/Plan:  Principal Problem:  Large Acute L MCA stroke and numerous scattered small acute infarcts in the bilateral MCA, right PCA, and bilateral PICA territories with right hemiparesis  - Findings consistent with cardiac/proximal aortic emboli vs coagulopathy from chemotherapy, malignancy;  MRI Large lefe MCA, but also Numerous scattered small acute infarcts in the bilateral MCA, right PCA, and bilateral PICA territories  MRA L MCA M1 occlusion  Carotid Doppler No significant stenosis  2D Echo No source of embolus  TEE DID not show cardiac source of emboli, no intracardiac thrombus, there was a PFO  LE venous doppler - negative  aspirin 81 mg orally every day prior to admission, now on aspirin 300 mg suppository  HgbA1c 6.4  SCDs for VTE prophylaxis  NPO per ST , pulled  out dophoff  Twice , may need a PEG tube due to high risk of dehydration (worsening hyponatremia ) requested interventional radiology to place this on Monday - daughter wants to hold off She also needs a loop recorder by EP before discharge   Disposition Had a long discussion with daughter regarding prognosis , inability to maintain nutrition and hydration by self feeding without a dophoff tube or a PEG Agreeable to palliative care due to multiple comordities  Aspiration pneumonia  Discontinued clindamycin ,currently on Unasyn for a total of 8 days of treatment then DC. Continue to taper Solu-Medrol, nebs, n.p.o. status   Hypernatremia  Change to D5W Sodium worsening ,poor prognostic sign  Dysphagia: Residual dysphagia from CVA  - Currently n.p.o. status, speech  therapy following closely, if no meaningful improvement, discussed in detail with patient's daughter at the bedside regarding PEG tube , will replace for now and initiate tube feeds again  Nutrition consult ordered   Diastolic CHF  -Due to permissive hypertension, restart Coreg   Progressive renal failure  Increase IV fluids   Mildly Elevated Troponin  -Likely due to massive stroke. No EKG changes. Have since normalized . 2-D echo showed EF of 60-65%, no regional wall motion abnormalities.   Thrombocythemia  -Likley due to chemotherapy, improving  l  Multiple myeloma  -Patient just ended one cycle of treatment and is on a planned 7 day rest - doubt her candidacy to return to active tx, unless dramatic improvement in clinical status occurs   Hypertension  Restart Coreg , increase IV hydralazine Started clonidine patch    Hypothyroid  Continue IV Synthroid, at 50 mcg daily,    DVT Prophylaxis:SCD's  Code Status: Full code  Family Communication: Discussed in detail with patient's daughter at the bedside  Disposition:continue on neuro floor   Consultants:  Neurology  Cardiology for TEE Procedures:  9/27 CT head without contrast Large LEFT MCA territory infarct involving LEFT temporal and  parietal lobes.  -Old small high RIGHT frontal & questionable LEFT cerebellar infarcts.  9/27 PCXR Patchy airspace disease,>left lung. C/W pneumonia vs aspiration vs pulmonary edema  9/28 MRI/MRA head without contrast Large acute left MCA infarct. Cytotoxic edema and mass effect but no associated hemorrhage at this time.  -Numerous scattered small acute infarcts in the bilateral MCA/right PCA/bilateral PICA territories. most  C/W embolic event (cardiac or proximal aortic source).  -Left  MCA occlusion in the M1 segment.  9/28 echocardiogram with contrast Left ventricle: mild LVH. -LVEF= 60% to 65%. -(grade 1 diastolic dysfunction). - Right ventricle: mildly dilated. - Right atrium: mildly  dilated.  - Pulmonary arteries: PA peak pressure: 52 mm Hg (S).    Antibiotics:  Clindamycin 9/27>  Unasyn    Code Status: full  Family Communication: family updated about patient's clinical progress  Disposition Plan: She will need SNF    Brief narrative:  68 y.o. female with a past medical history significant for HTN, hyperlipidemia, renal cell carcinoma s/p right nephrectomy, multiple myeloma on chemotherapy, transferred to [facility] for further stroke management. Patient has altered mental status and seems to be aphasic, family is unavailable, and thus all clinical information was obtained from the chart. She was initially evaluated at [facility] where notes indicated that " she was brought to the hospital by EMS due to altered mental status. The patient was found by her daughter at approximately 2 PM. She had fallen on the ground at an unknown time and was found on the floor by her daughter. She was altered at that point and had weakness of her right side including right facial droop and was not verbalizing. She was last known to be well last night. Code stroke was called alone neurology determined not to proceed with TPA due to unknown time of onset". At time of consult, she is lethargic but open eyes on verbal commands. On aspirin. last known well unknown.  MRI/MRA brain" large acute left MCA infarct. Cytotoxic edema and mass effect but no associated hemorrhage at this time. Numerous scattered small acute infarcts in the bilateral MCA, right PCA, and bilateral PICA territories.  Constellation most compatible with recent embolic event (cardiac or proximal aortic source). Left MCA occlusion in the M1 segment. Some reconstituted flow only in the anterior left MCA division.  Patient was not administered TPA secondary to delay in arrival. She was admitted to a step down unit for further evaluation and treatment.    Consultants:  Neurology  Cardiology  Procedures:  TEE  Antibiotics:  None   HPI/Subjective:  Patient alert dophoff tube last night, very uncomfortable  Objective: Filed Vitals:   09/25/14 0531 09/25/14 0836 09/25/14 0849 09/25/14 1035  BP: 136/55  186/76 183/73  Pulse: 83  85 75  Temp: 97.5 F (36.4 C)   97.4 F (36.3 C)  TempSrc: Axillary   Oral  Resp: 18   18  Height:      Weight:      SpO2: 100% 100%  100%   No intake or output data in the 24 hours ending 09/25/14 1047  Exam:  General: Somnolent combative Cardiovascular: RRR, nl S1 s2  Respiratory: Decreased breath sounds at the bases, scattered rhonchi, no crackles  Abdomen: soft +BS NT/ND, no masses palpable  Extremities: No cyanosis and no edema Neurologic-no response of withdrawal to pain, confused, hard to assess, not responding      Data Reviewed: Basic Metabolic Panel:  Recent Labs Lab 09/20/14 1525 09/20/14 2155 09/21/14 0347 09/22/14 0348 09/23/14 0529 09/24/14 0442 09/25/14 0422  NA 141  --  143 146 146 150* 152*  K 3.6*  --  4.2 3.9 3.7 3.8 4.3  CL 103  --  105 107 108 114* 116*  CO2 24  --  23 24 24 24 23   GLUCOSE 94  --  120* 162* 197* 225* 203*  BUN 25*  --  29* 39* 52* 59* 58*  CREATININE  1.59*  --  1.71* 1.76* 1.72* 1.76* 1.63*  CALCIUM 8.8  --  9.0 9.5 9.6 9.3 9.4  MG 2.2 2.2 2.3  --   --   --   --     Liver Function Tests:  Recent Labs Lab 09/21/14 0347 09/22/14 0348 09/23/14 0529 09/24/14 0442 09/25/14 0422  AST 51* 25 12 9 13   ALT 60* 62* 44* 33 29  ALKPHOS 44 47 46 46 49  BILITOT 0.8 0.7 0.5 0.4 0.5  PROT 6.7 6.9 6.7 6.4 6.2  ALBUMIN 2.4* 2.5* 2.6* 2.5* 2.5*   No results found for this basename: LIPASE, AMYLASE,  in the last 168 hours No results found for this basename: AMMONIA,  in the last 168 hours  CBC:  Recent Labs Lab 09/18/14 1558 09/19/14 1004 09/20/14 1525 09/21/14 0347 09/22/14 0348 09/24/14 0442  WBC 16.6* 12.6* 10.7* 11.1* 9.6 10.7*  NEUTROABS 14.0*  --  8.1* 8.8*  --   --   HGB 11.5* 10.6* 10.0* 10.9* 10.6* 10.8*   HCT 31.6* 29.4* 28.7* 31.1* 30.0* 30.9*  MCV 86.6 86.5 88.3 88.6 88.8 87.8  PLT 42* 30* 39* 54* 100* 238    Cardiac Enzymes:  Recent Labs Lab 09/19/14 0455 09/19/14 1004 09/20/14 2155 09/21/14 0347 09/21/14 1142  TROPONINI 0.33* 0.32* <0.30 <0.30 <0.30   BNP (last 3 results)  Recent Labs  09/19/14 1004  PROBNP 1843.0*     CBG:  Recent Labs Lab 09/24/14 1642 09/24/14 2111 09/24/14 2348 09/25/14 0437 09/25/14 0805  GLUCAP 168* 166* 147* 193* 182*    Recent Results (from the past 240 hour(s))  MRSA PCR SCREENING     Status: None   Collection Time    09/18/14  6:13 PM      Result Value Ref Range Status   MRSA by PCR NEGATIVE  NEGATIVE Final   Comment:            The GeneXpert MRSA Assay (FDA     approved for NASAL specimens     only), is one component of a     comprehensive MRSA colonization     surveillance program. It is not     intended to diagnose MRSA     infection nor to guide or     monitor treatment for     MRSA infections.  CULTURE, RESPIRATORY (NON-EXPECTORATED)     Status: None   Collection Time    09/20/14  8:42 PM      Result Value Ref Range Status   Specimen Description TRACHEAL ASPIRATE   Final   Special Requests Normal   Final   Gram Stain     Final   Value: FEW WBC PRESENT,BOTH PMN AND MONONUCLEAR     FEW SQUAMOUS EPITHELIAL CELLS PRESENT     MODERATE GRAM NEGATIVE RODS     FEW GRAM POSITIVE COCCI     IN PAIRS   Culture     Final   Value: Non-Pathogenic Oropharyngeal-type Flora Isolated.     Performed at [facility]   Report Status 09/23/2014 FINAL   Final  CULTURE, BLOOD (ROUTINE X 2)     Status: None   Collection Time    09/20/14  9:55 PM      Result Value Ref Range Status   Specimen Description BLOOD RIGHT ARM   Final   Special Requests     Final   Value: BOTTLES DRAWN AEROBIC AND ANAEROBIC 10CC AEROBIC 5CC ANAEROBIC   Culture  Setup  Time     Final   Value: 09/21/2014 03:28     Performed at [facility]    Culture     Final   Value:        BLOOD CULTURE RECEIVED NO GROWTH TO DATE CULTURE WILL BE HELD FOR 5 DAYS BEFORE ISSUING A FINAL NEGATIVE REPORT     Performed at [facility]   Report Status PENDING   Incomplete  CULTURE, BLOOD (ROUTINE X 2)     Status: None   Collection Time    09/20/14 10:05 PM      Result Value Ref Range Status   Specimen Description BLOOD RIGHT HAND   Final   Special Requests BOTTLES DRAWN AEROBIC ONLY 10CC   Final   Culture  Setup Time     Final   Value: 09/21/2014 03:29     Performed at [facility]   Culture     Final   Value:        BLOOD CULTURE RECEIVED NO GROWTH TO DATE CULTURE WILL BE HELD FOR 5 DAYS BEFORE ISSUING A FINAL NEGATIVE REPORT     Performed at [facility]   Report Status PENDING   Incomplete     Studies: Ct Abdomen Pelvis Wo Contrast  09/21/2014   CLINICAL DATA:  Evaluate for septic emboli. History of renal cell cancer, multiple myeloma.  EXAM: CT CHEST, ABDOMEN AND PELVIS WITHOUT CONTRAST  TECHNIQUE: Multidetector CT imaging of the chest, abdomen and pelvis was performed following the standard protocol without IV contrast.  COMPARISON:  CT 05/11/2014 and 04/20/2014.  FINDINGS: CT CHEST FINDINGS  Thoracic aorta normal caliber. Atherosclerotic vascular changes noted stable cardiomegaly. Coronary artery disease.  Shotty mediastinal lymph nodes.  Thoracic esophagus is unremarkable.  Endobronchial lesion is noted in the right mainstem bronchus, most likely mucous plugging. Similar findings noted in the trachea to a lesser degree. New also severe bilateral pulmonary infiltrates most consistent with pneumonia. Pulmonary edema could present this fashion. Tiny pleural effusions. No pneumothorax.  Calcified right thyroid lobe mass again noted. Chest wall is intact.  CT ABDOMEN AND PELVIS FINDINGS  Liver normal. Spleen normal. Pancreas normal. No biliary distention. The gallbladder is nondistended.  Right nephrectomy. Right surgical bed  is unremarkable. No mass lesion. Left kidney is unremarkable. No hydronephrosis or obstructing ureteral stone. Foley catheter is noted in the bladder. Air is noted bladder pelvis is most likely from instrumentation. Uterus and adnexa are unremarkable.  No significant adenopathy. Aortoiliac atherosclerotic vascular disease.  Appendiceal region is normal. There is no bowel distention. No free air. No mesenteric mass. Mild subcutaneous edema noted over the lower flanks. Developing mild anasarca could present in this fashion. Correlation to exclude cellulitis or developing hematoma suggested.  Diffuse severe thoracolumbar spine degenerative change. Sclerotic changes about the SI joints are stable.  IMPRESSION: 1. New severe bilateral pulmonary infiltrates most consistent with pneumonia. Pulmonary edema could present in this fashion. 2. Endotracheal and right endobronchial soft tissue densities, most likely mucous plugging. Possibility of a and bronchial primary or metastatic lesion cannot be excluded. 3. Right nephrectomy. Nephrectomy bed appears stable. No recurrent mass. 4. Mild subcutaneous edema noted over the lower flanks. Developing obstructive could present in this fashion. Clinical correlation is is suggested to exclude developing cellulitis or developing hematomas in these regions.   Electronically Signed   By: Maisie Fus  Register   On: 09/20/2014 21:55   Ct Head Wo Contrast  09/18/2014   CLINICAL DATA:  Code  stroke, altered level of consciousness  EXAM: CT HEAD WITHOUT CONTRAST  TECHNIQUE: Contiguous axial images were obtained from the base of the skull through the vertex without intravenous contrast.  COMPARISON:  None  FINDINGS: Cavum septum pellucidum and vergae.  Otherwise normal ventricular morphology.  No midline shift.  Old high RIGHT frontal infarct.  Large acute LEFT MCA territory infarct involving the LEFT temporal and parietal lobes with effacement of sulci.  No definite dense middle cerebral artery  sign.  No intracranial hemorrhage, mass lesion or extra-axial fluid collections.  Low-attenuation at posterior aspect of LEFT cerebellar hemisphere may represent a small old infarct as well.  Minimal small vessel chronic ischemic changes of deep cerebral white matter.  No acute bone or sinus findings.  Bony excrescences are identified at the sphenoid wings bilaterally potentially developmental variants or exostoses though difficult to completely exclude calcified meningiomas, considered much less likely.  IMPRESSION: Large LEFT MCA territory infarct involving LEFT temporal and parietal lobes.  No acute intracranial hemorrhage or midline shift.  Old small high RIGHT frontal and questionable LEFT cerebellar infarcts.  Critical Value/emergent results were called by telephone at the time of interpretation on 09/18/2014 at 1556 hr to Dr. Donnetta Hutching, who verbally acknowledged these results.   Electronically Signed   By: Ulyses Southward M.D.   On: 09/18/2014 15:58   Ct Chest Wo Contrast  09/20/2014   CLINICAL DATA:  Evaluate for septic emboli. History of renal cell cancer, multiple myeloma.  EXAM: CT CHEST, ABDOMEN AND PELVIS WITHOUT CONTRAST  TECHNIQUE: Multidetector CT imaging of the chest, abdomen and pelvis was performed following the standard protocol without IV contrast.  COMPARISON:  CT 05/11/2014 and 04/20/2014.  FINDINGS: CT CHEST FINDINGS  Thoracic aorta normal caliber. Atherosclerotic vascular changes noted stable cardiomegaly. Coronary artery disease.  Shotty mediastinal lymph nodes.  Thoracic esophagus is unremarkable.  Endobronchial lesion is noted in the right mainstem bronchus, most likely mucous plugging. Similar findings noted in the trachea to a lesser degree. New also severe bilateral pulmonary infiltrates most consistent with pneumonia. Pulmonary edema could present this fashion. Tiny pleural effusions. No pneumothorax.  Calcified right thyroid lobe mass again noted. Chest wall is intact.  CT ABDOMEN AND  PELVIS FINDINGS  Liver normal. Spleen normal. Pancreas normal. No biliary distention. The gallbladder is nondistended.  Right nephrectomy. Right surgical bed is unremarkable. No mass lesion. Left kidney is unremarkable. No hydronephrosis or obstructing ureteral stone. Foley catheter is noted in the bladder. Air is noted bladder pelvis is most likely from instrumentation. Uterus and adnexa are unremarkable.  No significant adenopathy. Aortoiliac atherosclerotic vascular disease.  Appendiceal region is normal. There is no bowel distention. No free air. No mesenteric mass. Mild subcutaneous edema noted over the lower flanks. Developing mild anasarca could present in this fashion. Correlation to exclude cellulitis or developing hematoma suggested.  Diffuse severe thoracolumbar spine degenerative change. Sclerotic changes about the SI joints are stable.  IMPRESSION: 1. New severe bilateral pulmonary infiltrates most consistent with pneumonia. Pulmonary edema could present in this fashion. 2. Endotracheal and right endobronchial soft tissue densities, most likely mucous plugging. Possibility of a and bronchial primary or metastatic lesion cannot be excluded. 3. Right nephrectomy. Nephrectomy bed appears stable. No recurrent mass. 4. Mild subcutaneous edema noted over the lower flanks. Developing obstructive could present in this fashion. Clinical correlation is is suggested to exclude developing cellulitis or developing hematomas in these regions.   Electronically Signed   By: Maisie Fus  Register   On:  09/20/2014 21:55   Mr Maxine Glenn Head Wo Contrast  09/19/2014   ADDENDUM REPORT: 09/19/2014 10:07  ADDENDUM: Study discussed by telephone with Dr. Kerry Hough On 09/19/2014 at 0958 hrs.   Electronically Signed   By: Augusto Gamble M.D.   On: 09/19/2014 10:07   09/19/2014   CLINICAL DATA:  68 year old female with left MCA infarct detected by CT presenting with code stroke, altered mental status. Initial encounter.  EXAM: MRI HEAD WITHOUT  CONTRAST  MRA HEAD WITHOUT CONTRAST  TECHNIQUE: Multiplanar, multiecho pulse sequences of the brain and surrounding structures were obtained without intravenous contrast. Angiographic images of the head were obtained using MRA technique without contrast.  COMPARISON:  Head CT without contrast 09/18/2014.  FINDINGS: MRI HEAD FINDINGS  Large confluent and intensely restricted diffusion in the left hemisphere extending from the insula and operculum posteriorly, involving most of the left occipital lobe as well as the posterior temporal lobe. Small nodular areas of restricted diffusion in the left basal ganglia, which otherwise are spared. Superimposed multiple bilateral punctate and nodular foci of restricted diffusion, including involvement of the right MCA and PCA territories. Furthermore there are multiple nodular foci restricted diffusion in both cerebellar hemispheres. See series 100.  Cytotoxic edema in the affected territories. Still, intracranial mass effect is Mild at this time, with Trace rightward midline shift. No definite petechial hemorrhage.  Major intracranial vascular flow voids are preserved.  Cavum septum pellucidum. Mass effect on the left lateral ventricle. No ventriculomegaly. Basilar cisterns remain patent. Negative pituitary, cervicomedullary junction, and grossly negative visualized cervical spine. Chronic lacunar infarcts superimposed in the right corona radiata and right superior frontal gyrus subcortical white matter (with small focus of cortical encephalomalacia).  Visualized orbit soft tissues are within normal limits. Visualized paranasal sinuses and mastoids are clear. Normal bone marrow signal. No acute scalp soft tissue findings.  MRA HEAD FINDINGS  Antegrade flow in the posterior circulation with codominant distal vertebral arteries. Patent vertebrobasilar junction. Right PICA origin is patent. Left PICA less well visualized. Basilar artery irregularity without hemodynamically  significant stenosis. Both PCA origins are patent. The right posterior communicating artery is present, the left is diminutive or absent. Bilateral PCA branches are within normal limits.  Antegrade flow in both ICA siphons. Bilateral siphon irregularity is moderate. No definite hemodynamically significant ICA siphon stenosis. Patent carotid termini. Normal right MCA and bilateral ACA origins.  The left MCA is occluded in the M1 segment 10 mm beyond its origin. Paucity of flow signal only in the left MCA anterior division M2 branches.  Anterior communicating artery diminutive or absent. Visualized bilateral ACA branches are within normal limits. Right MCA M1 segment and bifurcation are patent with mild to moderate irregularity. No definite M2 branch occlusion on the right.  IMPRESSION: 1. Large acute left MCA infarct. Cytotoxic edema and mass effect but no associated hemorrhage at this time. 2. Numerous scattered small acute infarcts in the bilateral MCA, right PCA, and bilateral PICA territories. Constellation most compatible with recent embolic event (cardiac or proximal aortic source). 3. Left MCA occlusion in the M1 segment. Some reconstituted flow only in the anterior left MCA division. 4. No other major circle of Willis branch occlusion. Moderate irregularity of both ICA siphons.  Electronically Signed: By: Augusto Gamble M.D. On: 09/19/2014 09:44   Mr Brain Wo Contrast  09/19/2014   ADDENDUM REPORT: 09/19/2014 10:07  ADDENDUM: Study discussed by telephone with Dr. Kerry Hough On 09/19/2014 at 0958 hrs.   Electronically Signed   By: Nedra Hai  Margo Aye M.D.   On: 09/19/2014 10:07   09/19/2014   CLINICAL DATA:  68 year old female with left MCA infarct detected by CT presenting with code stroke, altered mental status. Initial encounter.  EXAM: MRI HEAD WITHOUT CONTRAST  MRA HEAD WITHOUT CONTRAST  TECHNIQUE: Multiplanar, multiecho pulse sequences of the brain and surrounding structures were obtained without intravenous contrast.  Angiographic images of the head were obtained using MRA technique without contrast.  COMPARISON:  Head CT without contrast 09/18/2014.  FINDINGS: MRI HEAD FINDINGS  Large confluent and intensely restricted diffusion in the left hemisphere extending from the insula and operculum posteriorly, involving most of the left occipital lobe as well as the posterior temporal lobe. Small nodular areas of restricted diffusion in the left basal ganglia, which otherwise are spared. Superimposed multiple bilateral punctate and nodular foci of restricted diffusion, including involvement of the right MCA and PCA territories. Furthermore there are multiple nodular foci restricted diffusion in both cerebellar hemispheres. See series 100.  Cytotoxic edema in the affected territories. Still, intracranial mass effect is Mild at this time, with Trace rightward midline shift. No definite petechial hemorrhage.  Major intracranial vascular flow voids are preserved.  Cavum septum pellucidum. Mass effect on the left lateral ventricle. No ventriculomegaly. Basilar cisterns remain patent. Negative pituitary, cervicomedullary junction, and grossly negative visualized cervical spine. Chronic lacunar infarcts superimposed in the right corona radiata and right superior frontal gyrus subcortical white matter (with small focus of cortical encephalomalacia).  Visualized orbit soft tissues are within normal limits. Visualized paranasal sinuses and mastoids are clear. Normal bone marrow signal. No acute scalp soft tissue findings.  MRA HEAD FINDINGS  Antegrade flow in the posterior circulation with codominant distal vertebral arteries. Patent vertebrobasilar junction. Right PICA origin is patent. Left PICA less well visualized. Basilar artery irregularity without hemodynamically significant stenosis. Both PCA origins are patent. The right posterior communicating artery is present, the left is diminutive or absent. Bilateral PCA branches are within normal  limits.  Antegrade flow in both ICA siphons. Bilateral siphon irregularity is moderate. No definite hemodynamically significant ICA siphon stenosis. Patent carotid termini. Normal right MCA and bilateral ACA origins.  The left MCA is occluded in the M1 segment 10 mm beyond its origin. Paucity of flow signal only in the left MCA anterior division M2 branches.  Anterior communicating artery diminutive or absent. Visualized bilateral ACA branches are within normal limits. Right MCA M1 segment and bifurcation are patent with mild to moderate irregularity. No definite M2 branch occlusion on the right.  IMPRESSION: 1. Large acute left MCA infarct. Cytotoxic edema and mass effect but no associated hemorrhage at this time. 2. Numerous scattered small acute infarcts in the bilateral MCA, right PCA, and bilateral PICA territories. Constellation most compatible with recent embolic event (cardiac or proximal aortic source). 3. Left MCA occlusion in the M1 segment. Some reconstituted flow only in the anterior left MCA division. 4. No other major circle of Willis branch occlusion. Moderate irregularity of both ICA siphons.  Electronically Signed: By: Augusto Gamble M.D. On: 09/19/2014 09:44   US Soft Tissue Head/neck  08/31/2014   CLINICAL DATA:  thyroid nodule. FNA biopsy of dominant right nodule 09/07/2013.  EXAM: THYROID ULTRASOUND  TECHNIQUE: Ultrasound examination of the thyroid gland and adjacent soft tissues was performed.  COMPARISON:  08/04/2013  FINDINGS: Right thyroid lobe  Measurements: 50 x 23 x 32 mm. Dominant 36 x 20 x 25 mm complex solid nodule, mid lobe (previously 19 x 26 x 29). Adjacent  9 x 6 mm complex cyst at its inferior margin (previously 6 x 8 x 9).  Left thyroid lobe  Measurements: 48 x 18 x 27 mm. Markedly inhomogeneous background echotexture. Poorly marginated 14 x 7 x 10 mm hypoechoic lesion, mid lobe (previously 7 x 9 x 8). 8 x 6 x 9 mm complex cyst, inferior pole.  Isthmus  Thickness: 3 mm.  No nodules  visualized.  Lymphadenopathy  None visualized.  IMPRESSION: 1. Thyromegaly with bilateral nodules. Enlargement of dominant right lesion; correlate with previous biopsy results. No other lesion meets current consensus criteria for biopsy. Follow-up by clinical exam is recommended. If patient has known risk factors for thyroid carcinoma, consider follow-up ultrasound in 12 months. If patient is clinically hyperthyroid, consider nuclear medicine thyroid uptake and scan. This recommendation follows the consensus statement: Management of Thyroid Nodules Detected as [place]: Society of Radiologists in Ultrasound Consensus Conference Statement. Radiology 2005; [id number].   Electronically Signed   By: Oley Balm M.D.   On: 08/31/2014 14:30   Dg Chest Port 1 View  09/21/2014   CLINICAL DATA:  68 year old female with shortness of breath. Left MCA infarct, aspiration pneumonia.  EXAM: PORTABLE CHEST - 1 VIEW  COMPARISON:  09/18/2014 and prior chest radiographs dating back to 05/08/2000  FINDINGS: Cardiomegaly again noted.  Elevation of the right hemidiaphragm and bibasilar atelectasis again noted.  Pulmonary vascular congestion again identified.  There has been interval improvement in left lung aeration.  There is no evidence of pneumothorax.  No other significant change identified.  IMPRESSION: Improved left lung aeration without other significant change.   Electronically Signed   By: Laveda Abbe M.D.   On: 09/21/2014 08:15   Dg Chest Portable 1 View  09/18/2014   CLINICAL DATA:  Stroke.  EXAM: PORTABLE CHEST - 1 VIEW  COMPARISON:  Chest CT 05/11/2014  FINDINGS: Single view of the chest demonstrates low lung volumes. Patchy airspace densities in the left lung. There may be subtle airspace disease in the right lung. Heart size is accentuated by the low lung volumes.  IMPRESSION: Patchy airspace disease, particularly in the left lung. Findings are concerning for pneumonia or aspiration. Asymmetric pulmonary edema is also  in the differential diagnosis.   Electronically Signed   By: Richarda Overlie M.D.   On: 09/18/2014 17:10   Dg Abd Portable 1v  09/24/2014   CLINICAL DATA:  Assess feeding tube position  EXAM: PORTABLE ABDOMEN - 1 VIEW  COMPARISON:  Portable chest x-ray of September 24, 2014  FINDINGS: The radiodense feeding tube projects along the inferior border of the twelfth rib in the region of the distal stomach. It is little changed since yesterday's study. The bowel gas pattern is nonspecific.  IMPRESSION: The radiodense feeding tube tip overlies the pyloric region of the stomach.   Electronically Signed   By: David  [place]   On: 09/24/2014 13:40   Dg Abd Portable 1v  09/24/2014   CLINICAL DATA:  Panda tube placement  EXAM: PORTABLE ABDOMEN - 1 VIEW  COMPARISON:  09/23/2014  FINDINGS: Interval advancement of weighted tip enteric tube with tip now overlying the expected location of the gastric antrum.  Paucity of bowel gas without evidence of obstruction.  No supine evidence of pneumoperitoneum. No pneumatosis or portal venous gas.  Limited visualization of the adjacent thorax demonstrates minimal bibasilar opacities, left greater than right.  No acute osseus abnormalities.  IMPRESSION: Interval advancement of and weighted tip enteric tube, now overlying the expected location of  the gastric antrum.   Electronically Signed   By: Simonne Come M.D.   On: 09/24/2014 12:18   Dg Abd Portable 1v  09/23/2014   CLINICAL DATA:  Nasogastric tube placement.  EXAM: PORTABLE ABDOMEN - 1 VIEW  COMPARISON:  None.  FINDINGS: Feeding tube tip terminates just beyond the gastroesophageal junction. Bowel gas pattern is unremarkable. Basilar lung opacities are incidentally imaged.  IMPRESSION: Feeding tube tip terminates just beyond the gastroesophageal junction.   Electronically Signed   By: Leanna Battles M.D.   On: 09/23/2014 18:47    Scheduled Meds: . ampicillin-sulbactam (UNASYN) IV  3 g Intravenous Q6H  . antiseptic oral rinse  7 mL  Mouth Rinse q12n4p  . aspirin  300 mg Rectal Daily   Or  . aspirin  325 mg Oral Daily  . carvedilol  6.25 mg Oral BID WC  . chlorhexidine  15 mL Mouth Rinse BID  . cloNIDine  0.2 mg Transdermal Weekly  . fluticasone  2 spray Each Nare Daily  . insulin aspart  0-9 Units Subcutaneous 6 times per day  . ipratropium-albuterol  3 mL Nebulization BID  . levothyroxine  50 mcg Intravenous Daily  . methylPREDNISolone (SOLU-MEDROL) injection  40 mg Intravenous Q12H  . pantoprazole (PROTONIX) IV  40 mg Intravenous Q12H   Continuous Infusions: . dextrose 75 mL/hr at 09/25/14 0849  . feeding supplement (GLUCERNA 1.2 CAL) 1,000 mL (09/24/14 1702)    Principal Problem:   CVA (cerebral infarction) Active Problems:   Aspiration pneumonia   Thrombocytopenia, unspecified   Elevated troponin   CKD (chronic kidney disease) stage 3, GFR 30-59 ml/min    Time spent: 40 minutes   [facility]  [facility] Pager [phone]. If 7PM-7AM, please contact night-coverage at [email or web address], password [facility] 09/25/2014, 10:47 AM  LOS: 7 days

## 2014-09-25 NOTE — Progress Notes (Signed)
Patient ID: Melissa [name], female   DOB: April 01, 1946, 68 y.o.   MRN: [id number]    Subjective:    No events overnight  Objective:   Temp:  [97.4 F (36.3 C)-98.5 F (36.9 C)] 97.5 F (36.4 C) (10/04 0531) Pulse Rate:  [72-83] 83 (10/04 0531) Resp:  [18-20] 18 (10/04 0531) BP: (136-185)/(55-87) 136/55 mmHg (10/04 0531) SpO2:  [100 %] 100 % (10/04 0531) Weight:  [240 lb 4.8 oz (109 kg)] 240 lb 4.8 oz (109 kg) (10/04 0440)    Filed Weights   09/19/14 1900 09/24/14 0108 09/25/14 0440  Weight: 237 lb 10.5 oz (107.8 kg) 233 lb 4 oz (105.8 kg) 240 lb 4.8 oz (109 kg)   No intake or output data in the 24 hours ending 09/25/14 0835  Telemetry: Sinus rhythm, PACs, PVCs. Short run of atach, short run of NSVT 5 beats  Exam:  General: NAD  Resp: CTAB  Cardiac: RRR, no m/r/g, no JVD, no carotid bruits  GI: abdomen soft, NT, ND  MSK: no LE edema  Lab Results:  Basic Metabolic Panel:  Recent Labs Lab 09/20/14 1525 09/20/14 2155 09/21/14 0347  09/23/14 0529 09/24/14 0442 09/25/14 0422  NA 141  --  143  < > 146 150* 152*  K 3.6*  --  4.2  < > 3.7 3.8 4.3  CL 103  --  105  < > 108 114* 116*  CO2 24  --  23  < > 24 24 23   GLUCOSE 94  --  120*  < > 197* 225* 203*  BUN 25*  --  29*  < > 52* 59* 58*  CREATININE 1.59*  --  1.71*  < > 1.72* 1.76* 1.63*  CALCIUM 8.8  --  9.0  < > 9.6 9.3 9.4  MG 2.2 2.2 2.3  --   --   --   --   < > = values in this interval not displayed.  Liver Function Tests:  Recent Labs Lab 09/23/14 0529 09/24/14 0442 09/25/14 0422  AST 12 9 13   ALT 44* 33 29  ALKPHOS 46 46 49  BILITOT 0.5 0.4 0.5  PROT 6.7 6.4 6.2  ALBUMIN 2.6* 2.5* 2.5*    CBC:  Recent Labs Lab 09/21/14 0347 09/22/14 0348 09/24/14 0442  WBC 11.1* 9.6 10.7*  HGB 10.9* 10.6* 10.8*  HCT 31.1* 30.0* 30.9*  MCV 88.6 88.8 87.8  PLT 54* 100* 238    Cardiac Enzymes:  Recent Labs Lab 09/20/14 2155 09/21/14 0347 09/21/14 1142  TROPONINI <0.30 <0.30 <0.30     BNP:  Recent Labs  09/19/14 1004  PROBNP 1843.0*    Coagulation:  Recent Labs Lab 09/18/14 1558  INR 1.13    ECG:   Medications:   Scheduled Medications: . ampicillin-sulbactam (UNASYN) IV  3 g Intravenous Q6H  . antiseptic oral rinse  7 mL Mouth Rinse q12n4p  . aspirin  300 mg Rectal Daily   Or  . aspirin  325 mg Oral Daily  . carvedilol  6.25 mg Oral BID WC  . chlorhexidine  15 mL Mouth Rinse BID  . fluticasone  2 spray Each Nare Daily  . insulin aspart  0-9 Units Subcutaneous 6 times per day  . ipratropium-albuterol  3 mL Nebulization BID  . levothyroxine  50 mcg Intravenous Daily  . methylPREDNISolone (SOLU-MEDROL) injection  40 mg Intravenous Q12H  . pantoprazole (PROTONIX) IV  40 mg Intravenous Q12H     Infusions: . dextrose    .  feeding supplement (GLUCERNA 1.2 CAL) 1,000 mL (09/24/14 1702)     PRN Medications:  acetaminophen, albuterol, hydrALAZINE   09/19/14 Echo Study Conclusions  - Left ventricle: The cavity size was normal. Wall thickness was increased in a pattern of mild LVH. Systolic function was normal. The estimated ejection fraction was in the range of 60% to 65%. Wall motion was normal; there were no regional wall motion abnormalities. Doppler parameters are consistent with abnormal left ventricular relaxation (grade 1 diastolic dysfunction). - Mitral valve: There was mild regurgitation. - Right ventricle: The cavity size was mildly dilated. - Right atrium: The atrium was mildly dilated. Central venous pressure (est): 3 mm Hg. - Atrial septum: No defect or patent foramen ovale was identified. - Tricuspid valve: There was trivial regurgitation. - Pulmonary arteries: PA peak pressure: 52 mm Hg (S). - Pericardium, extracardiac: There was no pericardial effusion.  Impressions:  - Mild LVH with LVEF 60-65%, grade 1 diastolic dysfunction. Mild mitral regurgitation. Mildly dilated RV with normal contraction. Trivial tricuspid  regurgitation, PASP 52 mm mercury. No definitive PFO or ASD. No pericardial effusion.   09/23/14 TEE Study Conclusions  - Left ventricle: There was mild concentric hypertrophy. Systolic function was vigorous. The estimated ejection fraction was in the range of 65% to 70%. Wall motion was normal; there were no regional wall motion abnormalities. - Aortic valve: No evidence of vegetation. No evidence of vegetation. - Mitral valve: No evidence of vegetation. No evidence of vegetation. There was mild to moderate regurgitation directed centrally. - Left atrium: No evidence of thrombus in the atrial cavity or appendage. No spontaneous echo contrast was observed. - Right atrium: No evidence of thrombus in the atrial cavity or appendage. No evidence of thrombus in the atrial cavity or appendage. - Atrial septum: There was a patent foramen ovale. There was a moderate right-to-left atrial level shunt, in the baseline state. There was redundancy of the septum, with borderline criteria for aneurysm. - Tricuspid valve: No evidence of vegetation. - Pulmonic valve: No evidence of vegetation.  Impressions:  - Consider possible paradoxical embolism as cause of ischemic stroke. Evaluate for lower extremity DVT.    Assessment/Plan    1. CVA  - acute large left MCA infarct  - TEE without evidence of intracardiac thrombus. There was a PFO, LE venous US negative for DVTs -  - being considered for LINQ loop recorder by EP, will follow further recs and possible timing of implant during rounds on Monday. Continue to follow telemetry while in hospital  - post CVA, start high dose statin once taking PO. Continue ASA suppository for secondary prevention  2. Elevated trop - mild elevation to 0.4 in setting of severe CVA, echo without WMAs - continue CAD risk factor modification, no plans for ischemic testing at this time. Trop elevation likely related to CVA.        Dina Rich, M.D.

## 2014-09-26 ENCOUNTER — Inpatient Hospital Stay (HOSPITAL_COMMUNITY): Payer: Medicare Other

## 2014-09-26 ENCOUNTER — Ambulatory Visit (HOSPITAL_COMMUNITY): Payer: Medicare Other | Admitting: Oncology

## 2014-09-26 ENCOUNTER — Encounter (HOSPITAL_COMMUNITY): Payer: Self-pay | Admitting: Cardiovascular Disease

## 2014-09-26 LAB — COMPREHENSIVE METABOLIC PANEL
ALK PHOS: 60 U/L (ref 39–117)
ALT: 32 U/L (ref 0–35)
AST: 20 U/L (ref 0–37)
Albumin: 2.5 g/dL — ABNORMAL LOW (ref 3.5–5.2)
Anion gap: 12 (ref 5–15)
BUN: 55 mg/dL — ABNORMAL HIGH (ref 6–23)
CO2: 22 mEq/L (ref 19–32)
Calcium: 9.2 mg/dL (ref 8.4–10.5)
Chloride: 114 mEq/L — ABNORMAL HIGH (ref 96–112)
Creatinine, Ser: 1.46 mg/dL — ABNORMAL HIGH (ref 0.50–1.10)
GFR calc Af Amer: 41 mL/min — ABNORMAL LOW (ref >90)
GFR calc non Af Amer: 36 mL/min — ABNORMAL LOW (ref >90)
Glucose, Bld: 231 mg/dL — ABNORMAL HIGH (ref 70–99)
POTASSIUM: 4.9 meq/L (ref 3.7–5.3)
SODIUM: 148 meq/L — AB (ref 137–147)
TOTAL PROTEIN: 6 g/dL (ref 6.0–8.3)
Total Bilirubin: 0.3 mg/dL (ref 0.3–1.2)

## 2014-09-26 LAB — GLUCOSE, CAPILLARY
GLUCOSE-CAPILLARY: 219 mg/dL — AB (ref 70–99)
GLUCOSE-CAPILLARY: 208 mg/dL — AB (ref 70–99)
Glucose-Capillary: 263 mg/dL — ABNORMAL HIGH (ref 70–99)
Glucose-Capillary: 185 mg/dL — ABNORMAL HIGH (ref 70–99)
Glucose-Capillary: 255 mg/dL — ABNORMAL HIGH (ref 70–99)

## 2014-09-26 MED ORDER — SODIUM CHLORIDE 0.9 % IV SOLN: 3.0000 g | Freq: Four times a day (QID) | INTRAVENOUS | Status: DC

## 2014-09-26 MED ORDER — SODIUM CHLORIDE 0.9 % IV SOLN
3.0000 g | Freq: Four times a day (QID) | INTRAVENOUS | Status: AC
  Administered 2014-09-26 – 2014-09-27 (×4): 3 g via INTRAVENOUS
  Filled 2014-09-26 (×4): qty 3

## 2014-09-26 MED ORDER — ENOXAPARIN SODIUM 40 MG/0.4ML ~~LOC~~ SOLN
40.0000 mg | SUBCUTANEOUS | Status: DC
  Administered 2014-09-26 – 2014-09-30 (×4): 40 mg via SUBCUTANEOUS
  Filled 2014-09-26 (×4): qty 0.4

## 2014-09-26 NOTE — Progress Notes (Signed)
Weekend team received word from neurology that the patient's course is now palliative and that TEE/loop is no longer requested. We will s/o. Please call with questions. Aubri Gathright PA-C

## 2014-09-26 NOTE — Progress Notes (Signed)
Clinical Social Worker continues to follow patient to provide continued support and facilitate patient discharge needs. Patient has bed at [facility] post discharge.   Glendon Axe, MSW, LCSWA [phone] 09/26/2014 3:06 PM

## 2014-09-26 NOTE — Progress Notes (Addendum)
[facility] PROGRESS NOTE  Melissa [name] ZOX:[id number] DOB: Jul 16, 1946 DOA: 09/18/2014 PCP: [name], MD  Assessment/Plan: Principal Problem:   CVA (cerebral infarction) Active Problems:   Aspiration pneumonia   Thrombocytopenia, unspecified   Elevated troponin   CKD (chronic kidney disease) stage 3, GFR 30-59 ml/min   Assessment/Plan:  Principal Problem:  Large Acute L MCA stroke and numerous scattered small acute infarcts in the bilateral MCA, right PCA, and bilateral PICA territories with right hemiparesis  - Findings consistent with cardiac/proximal aortic emboli vs coagulopathy from chemotherapy, malignancy;  MRI Large lefe MCA, but also Numerous scattered small acute infarcts in the bilateral MCA, right PCA, and bilateral PICA territories  MRA L MCA M1 occlusion  Carotid Doppler No significant stenosis  2D Echo No source of embolus  TEE DID not show cardiac source of emboli, no intracardiac thrombus, there was a PFO  LE venous doppler - negative  aspirin 81 mg orally every day prior to admission, now on aspirin 300 mg suppository  HgbA1c 6.4  SCDs for VTE prophylaxis  Resume tube feeds , pulled out dophoff Twice , may need a PEG tube due to high risk of dehydration (worsening hypernatremia ) , worsening nutritional status  requested interventional radiology to place this on Monday - daughter wants to hold off  She also needs a loop recorder by EP before discharge , this has been placed on hold because of patient's PICC line  Disposition  Had a long discussion with daughter regarding prognosis , inability to maintain nutrition and hydration by self feeding even if initiated on feeding by mouth, without the help of dophoff tube or a PEG  Agreeable to palliative care due to multiple comordities  Strongly recommend DO NOT RESUSCITATE and Comfort Care This was discussed with the patient's daughter and sister   Aspiration pneumonia  Discontinued clindamycin  , completed Unasyn space 10/5 for a total of 8 days of treatment  Tapered  Solu-Medrol  Hypernatremia  Change to D5W  Sodium worsening ,poor prognostic sign    Dysphagia: Residual dysphagia from CVA  - Currently n.p.o. status, speech therapy following closely, if no meaningful improvement, discussed in detail with patient's daughter at the bedside regarding PEG tube , will replace for now and initiate tube feeds again  Nutrition following  Diastolic CHF  -Due to permissive hypertension, restart Coreg   Progressive renal failure  Increase IV fluids   Mildly Elevated Troponin  -Likely due to massive stroke. No EKG changes. Have since normalized . 2-D echo showed EF of 60-65%, no regional wall motion abnormalities.   Thrombocythemia  -Likley due to chemotherapy, improving  l  Multiple myeloma  -Patient just ended one cycle of treatment and is on a planned 7 day rest - doubt her candidacy to return to active tx, unless dramatic improvement in clinical status occurs    Hypertension  Restart Coreg , increase IV hydralazine  Started clonidine patch   Hypothyroid  Continue IV Synthroid, at 50 mcg daily,   DVT Prophylaxis:b start lovenox  Code Status: Full code  Family Communication: Discussed in detail with patient's daughter at the bedside  Disposition:continue on neuro floor  Consultants:  Neurology  Cardiology for TEE Procedures:  9/27 CT head without contrast Large LEFT MCA territory infarct involving LEFT temporal and  parietal lobes.  -Old small high RIGHT frontal & questionable LEFT cerebellar infarcts.  9/27 PCXR Patchy airspace disease,>left lung. C/W pneumonia vs aspiration vs pulmonary edema  9/28 MRI/MRA head without  contrast Large acute left MCA infarct. Cytotoxic edema and mass effect but no associated hemorrhage at this time.  -Numerous scattered small acute infarcts in the bilateral MCA/right PCA/bilateral PICA territories. most  C/W embolic event (cardiac or  proximal aortic source).  -Left MCA occlusion in the M1 segment.  9/28 echocardiogram with contrast Left ventricle: mild LVH. -LVEF= 60% to 65%. -(grade 1 diastolic dysfunction). - Right ventricle: mildly dilated. - Right atrium: mildly dilated.  - Pulmonary arteries: PA peak pressure: 52 mm Hg (S).    Antibiotics:  Clindamycin 9/27>  Unasyn  Code Status: full  Family Communication: family updated about patient's clinical progress  Disposition Plan: She will need SNF , goals of care meeting with palliative care   Brief narrative:  68 y.o. female with a past medical history significant for HTN, hyperlipidemia, renal cell carcinoma s/p right nephrectomy, multiple myeloma on chemotherapy, transferred to [facility] for further stroke management. Patient has altered mental status and seems to be aphasic, family is unavailable, and thus all clinical information was obtained from the chart. She was initially evaluated at [facility] where notes indicated that " she was brought to the hospital by EMS due to altered mental status. The patient was found by her daughter at approximately 2 PM. She had fallen on the ground at an unknown time and was found on the floor by her daughter. She was altered at that point and had weakness of her right side including right facial droop and was not verbalizing. She was last known to be well last night. Code stroke was called alone neurology determined not to proceed with TPA due to unknown time of onset". At time of consult, she is lethargic but open eyes on verbal commands. On aspirin. last known well unknown.  MRI/MRA brain" large acute left MCA infarct. Cytotoxic edema and mass effect but no associated hemorrhage at this time. Numerous scattered small acute infarcts in the bilateral MCA, right PCA, and bilateral PICA territories.  Constellation most compatible with recent embolic event (cardiac or proximal aortic source). Left MCA occlusion in the M1 segment. Some reconstituted  flow only in the anterior left MCA division.  Patient was not administered TPA secondary to delay in arrival. She was admitted to a step down unit for further evaluation and treatment.    Consultants:  Neurology  Cardiology  Procedures:  TEE  Antibiotics:  None    HPI/Subjective: According to the sister and her daughter patient has been very uncomfortable  Objective: Filed Vitals:   09/26/14 0500 09/26/14 0607 09/26/14 0836 09/26/14 0945  BP:  161/61 180/53 161/60  Pulse:   82 83  Temp:    97.7 F (36.5 C)  TempSrc:    Axillary  Resp:    20  Height:      Weight: 108.8 kg (239 lb 13.8 oz)     SpO2:    100%    Intake/Output Summary (Last 24 hours) at 09/26/14 1155 Last data filed at 09/25/14 1700  Gross per 24 hour  Intake    200 ml  Output      0 ml  Net    200 ml    Exam:  General: Somnolent combative  Cardiovascular: RRR, nl S1 s2  Respiratory: Decreased breath sounds at the bases, scattered rhonchi, no crackles  Abdomen: soft +BS NT/ND, no masses palpable  Extremities: No cyanosis and no edema  Neurologic-no response of withdrawal to pain, confused, hard to assess, not responding  Data Reviewed: Basic Metabolic Panel:  Recent Labs Lab 09/20/14 1525 09/20/14 2155 09/21/14 0347 09/22/14 0348 09/23/14 0529 09/24/14 0442 09/25/14 0422 09/26/14 0657  NA 141  --  143 146 146 150* 152* 148*  K 3.6*  --  4.2 3.9 3.7 3.8 4.3 4.9  CL 103  --  105 107 108 114* 116* 114*  CO2 24  --  23 24 24 24 23 22   GLUCOSE 94  --  120* 162* 197* 225* 203* 231*  BUN 25*  --  29* 39* 52* 59* 58* 55*  CREATININE 1.59*  --  1.71* 1.76* 1.72* 1.76* 1.63* 1.46*  CALCIUM 8.8  --  9.0 9.5 9.6 9.3 9.4 9.2  MG 2.2 2.2 2.3  --   --   --   --   --     Liver Function Tests:  Recent Labs Lab 09/22/14 0348 09/23/14 0529 09/24/14 0442 09/25/14 0422 09/26/14 0657  AST 25 12 9 13 20   ALT 62* 44* 33 29 32  ALKPHOS 47 46 46 49 60  BILITOT 0.7 0.5 0.4 0.5 0.3  PROT  6.9 6.7 6.4 6.2 6.0  ALBUMIN 2.5* 2.6* 2.5* 2.5* 2.5*   No results found for this basename: LIPASE, AMYLASE,  in the last 168 hours No results found for this basename: AMMONIA,  in the last 168 hours  CBC:  Recent Labs Lab 09/20/14 1525 09/21/14 0347 09/22/14 0348 09/24/14 0442  WBC 10.7* 11.1* 9.6 10.7*  NEUTROABS 8.1* 8.8*  --   --   HGB 10.0* 10.9* 10.6* 10.8*  HCT 28.7* 31.1* 30.0* 30.9*  MCV 88.3 88.6 88.8 87.8  PLT 39* 54* 100* 238    Cardiac Enzymes:  Recent Labs Lab 09/20/14 2155 09/21/14 0347 09/21/14 1142  TROPONINI <0.30 <0.30 <0.30   BNP (last 3 results)  Recent Labs  09/19/14 1004  PROBNP 1843.0*     CBG:  Recent Labs Lab 09/25/14 1624 09/25/14 2009 09/25/14 2356 09/26/14 0438 09/26/14 0759  GLUCAP 205* 182* 218* 263* 185*    Recent Results (from the past 240 hour(s))  MRSA PCR SCREENING     Status: None   Collection Time    09/18/14  6:13 PM      Result Value Ref Range Status   MRSA by PCR NEGATIVE  NEGATIVE Final   Comment:            The GeneXpert MRSA Assay (FDA     approved for NASAL specimens     only), is one component of a     comprehensive MRSA colonization     surveillance program. It is not     intended to diagnose MRSA     infection nor to guide or     monitor treatment for     MRSA infections.  CULTURE, RESPIRATORY (NON-EXPECTORATED)     Status: None   Collection Time    09/20/14  8:42 PM      Result Value Ref Range Status   Specimen Description TRACHEAL ASPIRATE   Final   Special Requests Normal   Final   Gram Stain     Final   Value: FEW WBC PRESENT,BOTH PMN AND MONONUCLEAR     FEW SQUAMOUS EPITHELIAL CELLS PRESENT     MODERATE GRAM NEGATIVE RODS     FEW GRAM POSITIVE COCCI     IN PAIRS   Culture     Final   Value: Non-Pathogenic Oropharyngeal-type Flora Isolated.     Performed at [facility]   Report Status 09/23/2014 FINAL   Final  CULTURE, BLOOD (ROUTINE X 2)     Status: None   Collection Time     09/20/14  9:55 PM      Result Value Ref Range Status   Specimen Description BLOOD RIGHT ARM   Final   Special Requests     Final   Value: BOTTLES DRAWN AEROBIC AND ANAEROBIC 10CC AEROBIC 5CC ANAEROBIC   Culture  Setup Time     Final   Value: 09/21/2014 03:28     Performed at [facility]   Culture     Final   Value:        BLOOD CULTURE RECEIVED NO GROWTH TO DATE CULTURE WILL BE HELD FOR 5 DAYS BEFORE ISSUING A FINAL NEGATIVE REPORT     Performed at [facility]   Report Status PENDING   Incomplete  CULTURE, BLOOD (ROUTINE X 2)     Status: None   Collection Time    09/20/14 10:05 PM      Result Value Ref Range Status   Specimen Description BLOOD RIGHT HAND   Final   Special Requests BOTTLES DRAWN AEROBIC ONLY 10CC   Final   Culture  Setup Time     Final   Value: 09/21/2014 03:29     Performed at [facility]   Culture     Final   Value:        BLOOD CULTURE RECEIVED NO GROWTH TO DATE CULTURE WILL BE HELD FOR 5 DAYS BEFORE ISSUING A FINAL NEGATIVE REPORT     Performed at [facility]   Report Status PENDING   Incomplete     Studies: Ct Abdomen Pelvis Wo Contrast  09/21/2014   CLINICAL DATA:  Evaluate for septic emboli. History of renal cell cancer, multiple myeloma.  EXAM: CT CHEST, ABDOMEN AND PELVIS WITHOUT CONTRAST  TECHNIQUE: Multidetector CT imaging of the chest, abdomen and pelvis was performed following the standard protocol without IV contrast.  COMPARISON:  CT 05/11/2014 and 04/20/2014.  FINDINGS: CT CHEST FINDINGS  Thoracic aorta normal caliber. Atherosclerotic vascular changes noted stable cardiomegaly. Coronary artery disease.  Shotty mediastinal lymph nodes.  Thoracic esophagus is unremarkable.  Endobronchial lesion is noted in the right mainstem bronchus, most likely mucous plugging. Similar findings noted in the trachea to a lesser degree. New also severe bilateral pulmonary infiltrates most consistent with pneumonia. Pulmonary edema  could present this fashion. Tiny pleural effusions. No pneumothorax.  Calcified right thyroid lobe mass again noted. Chest wall is intact.  CT ABDOMEN AND PELVIS FINDINGS  Liver normal. Spleen normal. Pancreas normal. No biliary distention. The gallbladder is nondistended.  Right nephrectomy. Right surgical bed is unremarkable. No mass lesion. Left kidney is unremarkable. No hydronephrosis or obstructing ureteral stone. Foley catheter is noted in the bladder. Air is noted bladder pelvis is most likely from instrumentation. Uterus and adnexa are unremarkable.  No significant adenopathy. Aortoiliac atherosclerotic vascular disease.  Appendiceal region is normal. There is no bowel distention. No free air. No mesenteric mass. Mild subcutaneous edema noted over the lower flanks. Developing mild anasarca could present in this fashion. Correlation to exclude cellulitis or developing hematoma suggested.  Diffuse severe thoracolumbar spine degenerative change. Sclerotic changes about the SI joints are stable.  IMPRESSION: 1. New severe bilateral pulmonary infiltrates most consistent with pneumonia. Pulmonary edema could present in this fashion. 2. Endotracheal and right endobronchial soft tissue densities, most likely mucous plugging. Possibility of  a and bronchial primary or metastatic lesion cannot be excluded. 3. Right nephrectomy. Nephrectomy bed appears stable. No recurrent mass. 4. Mild subcutaneous edema noted over the lower flanks. Developing obstructive could present in this fashion. Clinical correlation is is suggested to exclude developing cellulitis or developing hematomas in these regions.   Electronically Signed   By: Maisie Fus  Register   On: 09/20/2014 21:55   Ct Head Wo Contrast  09/18/2014   CLINICAL DATA:  Code stroke, altered level of consciousness  EXAM: CT HEAD WITHOUT CONTRAST  TECHNIQUE: Contiguous axial images were obtained from the base of the skull through the vertex without intravenous contrast.   COMPARISON:  None  FINDINGS: Cavum septum pellucidum and vergae.  Otherwise normal ventricular morphology.  No midline shift.  Old high RIGHT frontal infarct.  Large acute LEFT MCA territory infarct involving the LEFT temporal and parietal lobes with effacement of sulci.  No definite dense middle cerebral artery sign.  No intracranial hemorrhage, mass lesion or extra-axial fluid collections.  Low-attenuation at posterior aspect of LEFT cerebellar hemisphere may represent a small old infarct as well.  Minimal small vessel chronic ischemic changes of deep cerebral white matter.  No acute bone or sinus findings.  Bony excrescences are identified at the sphenoid wings bilaterally potentially developmental variants or exostoses though difficult to completely exclude calcified meningiomas, considered much less likely.  IMPRESSION: Large LEFT MCA territory infarct involving LEFT temporal and parietal lobes.  No acute intracranial hemorrhage or midline shift.  Old small high RIGHT frontal and questionable LEFT cerebellar infarcts.  Critical Value/emergent results were called by telephone at the time of interpretation on 09/18/2014 at 1556 hr to Dr. Donnetta Hutching, who verbally acknowledged these results.   Electronically Signed   By: Ulyses Southward M.D.   On: 09/18/2014 15:58   Ct Chest Wo Contrast  09/20/2014   CLINICAL DATA:  Evaluate for septic emboli. History of renal cell cancer, multiple myeloma.  EXAM: CT CHEST, ABDOMEN AND PELVIS WITHOUT CONTRAST  TECHNIQUE: Multidetector CT imaging of the chest, abdomen and pelvis was performed following the standard protocol without IV contrast.  COMPARISON:  CT 05/11/2014 and 04/20/2014.  FINDINGS: CT CHEST FINDINGS  Thoracic aorta normal caliber. Atherosclerotic vascular changes noted stable cardiomegaly. Coronary artery disease.  Shotty mediastinal lymph nodes.  Thoracic esophagus is unremarkable.  Endobronchial lesion is noted in the right mainstem bronchus, most likely mucous  plugging. Similar findings noted in the trachea to a lesser degree. New also severe bilateral pulmonary infiltrates most consistent with pneumonia. Pulmonary edema could present this fashion. Tiny pleural effusions. No pneumothorax.  Calcified right thyroid lobe mass again noted. Chest wall is intact.  CT ABDOMEN AND PELVIS FINDINGS  Liver normal. Spleen normal. Pancreas normal. No biliary distention. The gallbladder is nondistended.  Right nephrectomy. Right surgical bed is unremarkable. No mass lesion. Left kidney is unremarkable. No hydronephrosis or obstructing ureteral stone. Foley catheter is noted in the bladder. Air is noted bladder pelvis is most likely from instrumentation. Uterus and adnexa are unremarkable.  No significant adenopathy. Aortoiliac atherosclerotic vascular disease.  Appendiceal region is normal. There is no bowel distention. No free air. No mesenteric mass. Mild subcutaneous edema noted over the lower flanks. Developing mild anasarca could present in this fashion. Correlation to exclude cellulitis or developing hematoma suggested.  Diffuse severe thoracolumbar spine degenerative change. Sclerotic changes about the SI joints are stable.  IMPRESSION: 1. New severe bilateral pulmonary infiltrates most consistent with pneumonia. Pulmonary edema could present in  this fashion. 2. Endotracheal and right endobronchial soft tissue densities, most likely mucous plugging. Possibility of a and bronchial primary or metastatic lesion cannot be excluded. 3. Right nephrectomy. Nephrectomy bed appears stable. No recurrent mass. 4. Mild subcutaneous edema noted over the lower flanks. Developing obstructive could present in this fashion. Clinical correlation is is suggested to exclude developing cellulitis or developing hematomas in these regions.   Electronically Signed   By: Maisie Fus  Register   On: 09/20/2014 21:55   Mr Maxine Glenn Head Wo Contrast  09/19/2014   ADDENDUM REPORT: 09/19/2014 10:07  ADDENDUM: Study  discussed by telephone with Dr. Kerry Hough On 09/19/2014 at 0958 hrs.   Electronically Signed   By: Augusto Gamble M.D.   On: 09/19/2014 10:07   09/19/2014   CLINICAL DATA:  68 year old female with left MCA infarct detected by CT presenting with code stroke, altered mental status. Initial encounter.  EXAM: MRI HEAD WITHOUT CONTRAST  MRA HEAD WITHOUT CONTRAST  TECHNIQUE: Multiplanar, multiecho pulse sequences of the brain and surrounding structures were obtained without intravenous contrast. Angiographic images of the head were obtained using MRA technique without contrast.  COMPARISON:  Head CT without contrast 09/18/2014.  FINDINGS: MRI HEAD FINDINGS  Large confluent and intensely restricted diffusion in the left hemisphere extending from the insula and operculum posteriorly, involving most of the left occipital lobe as well as the posterior temporal lobe. Small nodular areas of restricted diffusion in the left basal ganglia, which otherwise are spared. Superimposed multiple bilateral punctate and nodular foci of restricted diffusion, including involvement of the right MCA and PCA territories. Furthermore there are multiple nodular foci restricted diffusion in both cerebellar hemispheres. See series 100.  Cytotoxic edema in the affected territories. Still, intracranial mass effect is Mild at this time, with Trace rightward midline shift. No definite petechial hemorrhage.  Major intracranial vascular flow voids are preserved.  Cavum septum pellucidum. Mass effect on the left lateral ventricle. No ventriculomegaly. Basilar cisterns remain patent. Negative pituitary, cervicomedullary junction, and grossly negative visualized cervical spine. Chronic lacunar infarcts superimposed in the right corona radiata and right superior frontal gyrus subcortical white matter (with small focus of cortical encephalomalacia).  Visualized orbit soft tissues are within normal limits. Visualized paranasal sinuses and mastoids are clear. Normal  bone marrow signal. No acute scalp soft tissue findings.  MRA HEAD FINDINGS  Antegrade flow in the posterior circulation with codominant distal vertebral arteries. Patent vertebrobasilar junction. Right PICA origin is patent. Left PICA less well visualized. Basilar artery irregularity without hemodynamically significant stenosis. Both PCA origins are patent. The right posterior communicating artery is present, the left is diminutive or absent. Bilateral PCA branches are within normal limits.  Antegrade flow in both ICA siphons. Bilateral siphon irregularity is moderate. No definite hemodynamically significant ICA siphon stenosis. Patent carotid termini. Normal right MCA and bilateral ACA origins.  The left MCA is occluded in the M1 segment 10 mm beyond its origin. Paucity of flow signal only in the left MCA anterior division M2 branches.  Anterior communicating artery diminutive or absent. Visualized bilateral ACA branches are within normal limits. Right MCA M1 segment and bifurcation are patent with mild to moderate irregularity. No definite M2 branch occlusion on the right.  IMPRESSION: 1. Large acute left MCA infarct. Cytotoxic edema and mass effect but no associated hemorrhage at this time. 2. Numerous scattered small acute infarcts in the bilateral MCA, right PCA, and bilateral PICA territories. Constellation most compatible with recent embolic event (cardiac or proximal aortic source).  3. Left MCA occlusion in the M1 segment. Some reconstituted flow only in the anterior left MCA division. 4. No other major circle of Willis branch occlusion. Moderate irregularity of both ICA siphons.  Electronically Signed: By: Augusto Gamble M.D. On: 09/19/2014 09:44   Mr Brain Wo Contrast  09/19/2014   ADDENDUM REPORT: 09/19/2014 10:07  ADDENDUM: Study discussed by telephone with Dr. Kerry Hough On 09/19/2014 at 0958 hrs.   Electronically Signed   By: Augusto Gamble M.D.   On: 09/19/2014 10:07   09/19/2014   CLINICAL DATA:  68 year old  female with left MCA infarct detected by CT presenting with code stroke, altered mental status. Initial encounter.  EXAM: MRI HEAD WITHOUT CONTRAST  MRA HEAD WITHOUT CONTRAST  TECHNIQUE: Multiplanar, multiecho pulse sequences of the brain and surrounding structures were obtained without intravenous contrast. Angiographic images of the head were obtained using MRA technique without contrast.  COMPARISON:  Head CT without contrast 09/18/2014.  FINDINGS: MRI HEAD FINDINGS  Large confluent and intensely restricted diffusion in the left hemisphere extending from the insula and operculum posteriorly, involving most of the left occipital lobe as well as the posterior temporal lobe. Small nodular areas of restricted diffusion in the left basal ganglia, which otherwise are spared. Superimposed multiple bilateral punctate and nodular foci of restricted diffusion, including involvement of the right MCA and PCA territories. Furthermore there are multiple nodular foci restricted diffusion in both cerebellar hemispheres. See series 100.  Cytotoxic edema in the affected territories. Still, intracranial mass effect is Mild at this time, with Trace rightward midline shift. No definite petechial hemorrhage.  Major intracranial vascular flow voids are preserved.  Cavum septum pellucidum. Mass effect on the left lateral ventricle. No ventriculomegaly. Basilar cisterns remain patent. Negative pituitary, cervicomedullary junction, and grossly negative visualized cervical spine. Chronic lacunar infarcts superimposed in the right corona radiata and right superior frontal gyrus subcortical white matter (with small focus of cortical encephalomalacia).  Visualized orbit soft tissues are within normal limits. Visualized paranasal sinuses and mastoids are clear. Normal bone marrow signal. No acute scalp soft tissue findings.  MRA HEAD FINDINGS  Antegrade flow in the posterior circulation with codominant distal vertebral arteries. Patent  vertebrobasilar junction. Right PICA origin is patent. Left PICA less well visualized. Basilar artery irregularity without hemodynamically significant stenosis. Both PCA origins are patent. The right posterior communicating artery is present, the left is diminutive or absent. Bilateral PCA branches are within normal limits.  Antegrade flow in both ICA siphons. Bilateral siphon irregularity is moderate. No definite hemodynamically significant ICA siphon stenosis. Patent carotid termini. Normal right MCA and bilateral ACA origins.  The left MCA is occluded in the M1 segment 10 mm beyond its origin. Paucity of flow signal only in the left MCA anterior division M2 branches.  Anterior communicating artery diminutive or absent. Visualized bilateral ACA branches are within normal limits. Right MCA M1 segment and bifurcation are patent with mild to moderate irregularity. No definite M2 branch occlusion on the right.  IMPRESSION: 1. Large acute left MCA infarct. Cytotoxic edema and mass effect but no associated hemorrhage at this time. 2. Numerous scattered small acute infarcts in the bilateral MCA, right PCA, and bilateral PICA territories. Constellation most compatible with recent embolic event (cardiac or proximal aortic source). 3. Left MCA occlusion in the M1 segment. Some reconstituted flow only in the anterior left MCA division. 4. No other major circle of Willis branch occlusion. Moderate irregularity of both ICA siphons.  Electronically Signed: By: Nedra Hai  Margo Aye M.D. On: 09/19/2014 09:44   US Soft Tissue Head/neck  08/31/2014   CLINICAL DATA:  thyroid nodule. FNA biopsy of dominant right nodule 09/07/2013.  EXAM: THYROID ULTRASOUND  TECHNIQUE: Ultrasound examination of the thyroid gland and adjacent soft tissues was performed.  COMPARISON:  08/04/2013  FINDINGS: Right thyroid lobe  Measurements: 50 x 23 x 32 mm. Dominant 36 x 20 x 25 mm complex solid nodule, mid lobe (previously 19 x 26 x 29). Adjacent 9 x 6 mm  complex cyst at its inferior margin (previously 6 x 8 x 9).  Left thyroid lobe  Measurements: 48 x 18 x 27 mm. Markedly inhomogeneous background echotexture. Poorly marginated 14 x 7 x 10 mm hypoechoic lesion, mid lobe (previously 7 x 9 x 8). 8 x 6 x 9 mm complex cyst, inferior pole.  Isthmus  Thickness: 3 mm.  No nodules visualized.  Lymphadenopathy  None visualized.  IMPRESSION: 1. Thyromegaly with bilateral nodules. Enlargement of dominant right lesion; correlate with previous biopsy results. No other lesion meets current consensus criteria for biopsy. Follow-up by clinical exam is recommended. If patient has known risk factors for thyroid carcinoma, consider follow-up ultrasound in 12 months. If patient is clinically hyperthyroid, consider nuclear medicine thyroid uptake and scan. This recommendation follows the consensus statement: Management of Thyroid Nodules Detected as [place]: Society of Radiologists in Ultrasound Consensus Conference Statement. Radiology 2005; [id number].   Electronically Signed   By: Oley Balm M.D.   On: 08/31/2014 14:30   Dg Chest Port 1 View  09/21/2014   CLINICAL DATA:  68 year old female with shortness of breath. Left MCA infarct, aspiration pneumonia.  EXAM: PORTABLE CHEST - 1 VIEW  COMPARISON:  09/18/2014 and prior chest radiographs dating back to 05/08/2000  FINDINGS: Cardiomegaly again noted.  Elevation of the right hemidiaphragm and bibasilar atelectasis again noted.  Pulmonary vascular congestion again identified.  There has been interval improvement in left lung aeration.  There is no evidence of pneumothorax.  No other significant change identified.  IMPRESSION: Improved left lung aeration without other significant change.   Electronically Signed   By: Laveda Abbe M.D.   On: 09/21/2014 08:15   Dg Chest Portable 1 View  09/18/2014   CLINICAL DATA:  Stroke.  EXAM: PORTABLE CHEST - 1 VIEW  COMPARISON:  Chest CT 05/11/2014  FINDINGS: Single view of the chest demonstrates low  lung volumes. Patchy airspace densities in the left lung. There may be subtle airspace disease in the right lung. Heart size is accentuated by the low lung volumes.  IMPRESSION: Patchy airspace disease, particularly in the left lung. Findings are concerning for pneumonia or aspiration. Asymmetric pulmonary edema is also in the differential diagnosis.   Electronically Signed   By: Richarda Overlie M.D.   On: 09/18/2014 17:10   Dg Abd Portable 1v  09/24/2014   CLINICAL DATA:  Assess feeding tube position  EXAM: PORTABLE ABDOMEN - 1 VIEW  COMPARISON:  Portable chest x-ray of September 24, 2014  FINDINGS: The radiodense feeding tube projects along the inferior border of the twelfth rib in the region of the distal stomach. It is little changed since yesterday's study. The bowel gas pattern is nonspecific.  IMPRESSION: The radiodense feeding tube tip overlies the pyloric region of the stomach.   Electronically Signed   By: David  [place]   On: 09/24/2014 13:40   Dg Abd Portable 1v  09/24/2014   CLINICAL DATA:  Panda tube placement  EXAM: PORTABLE ABDOMEN - 1 VIEW  COMPARISON:  09/23/2014  FINDINGS: Interval advancement of weighted tip enteric tube with tip now overlying the expected location of the gastric antrum.  Paucity of bowel gas without evidence of obstruction.  No supine evidence of pneumoperitoneum. No pneumatosis or portal venous gas.  Limited visualization of the adjacent thorax demonstrates minimal bibasilar opacities, left greater than right.  No acute osseus abnormalities.  IMPRESSION: Interval advancement of and weighted tip enteric tube, now overlying the expected location of the gastric antrum.   Electronically Signed   By: Simonne Come M.D.   On: 09/24/2014 12:18   Dg Abd Portable 1v  09/23/2014   CLINICAL DATA:  Nasogastric tube placement.  EXAM: PORTABLE ABDOMEN - 1 VIEW  COMPARISON:  None.  FINDINGS: Feeding tube tip terminates just beyond the gastroesophageal junction. Bowel gas pattern is  unremarkable. Basilar lung opacities are incidentally imaged.  IMPRESSION: Feeding tube tip terminates just beyond the gastroesophageal junction.   Electronically Signed   By: Leanna Battles M.D.   On: 09/23/2014 18:47    Scheduled Meds: . ampicillin-sulbactam (UNASYN) IV  3 g Intravenous Q6H  . antiseptic oral rinse  7 mL Mouth Rinse q12n4p  . aspirin  300 mg Rectal Daily   Or  . aspirin  325 mg Oral Daily  . carvedilol  6.25 mg Oral BID WC  . chlorhexidine  15 mL Mouth Rinse BID  . cloNIDine  0.2 mg Transdermal Weekly  . fluticasone  2 spray Each Nare Daily  . free water  100 mL Per Tube 6 times per day  . insulin aspart  0-9 Units Subcutaneous 6 times per day  . ipratropium-albuterol  3 mL Nebulization BID  . levothyroxine  50 mcg Intravenous Daily  . pantoprazole (PROTONIX) IV  40 mg Intravenous Q12H   Continuous Infusions: . dextrose 75 mL/hr at 09/25/14 2304  . feeding supplement (GLUCERNA 1.2 CAL) 1,000 mL (09/25/14 0930)    Principal Problem:   CVA (cerebral infarction) Active Problems:   Aspiration pneumonia   Thrombocytopenia, unspecified   Elevated troponin   CKD (chronic kidney disease) stage 3, GFR 30-59 ml/min    Time spent: 40 minutes   [facility]  [facility] Pager [phone]. If 7PM-7AM, please contact night-coverage at [email or web address], password [facility] 09/26/2014, 11:55 AM  LOS: 8 days

## 2014-09-26 NOTE — Progress Notes (Signed)
Supervised and reviewed by Semaj Kham MA CCC-SLP  

## 2014-09-26 NOTE — Progress Notes (Signed)
Speech Language Pathology Treatment: Dysphagia  Patient Details Name: Melissa [name] MRN: [id number] DOB: 04/07/1946 Today's Date: 09/26/2014 Time: 8366-2947 SLP Time Calculation (min): 26 min  Assessment / Plan / Recommendation Clinical Impression  Pt demonstrates need for objective swallow evaluation. Movement appears apraxic with difficulty initiating. 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 No signs or symptoms of aspiration are noted, but assessment limited due to impaired expressive and receptive abilities, decreased sensation, and apraxia.Recommend MBS to better evaluate swallow function and aspiration ris.   HPI HPI: 68 y.o. admitted with AMS and found down. PMH: obesity, hypothyroidism, hypertension, hyperlipidemia, history of kidney cancer status post right nephrectomy, pulmonary fibrosis, multiple myeloma currently undergoing chemotherapy. Found to have right sided weakness and facial droop and was not verbalizing.  MRI large acute left MCA infarct and numerous scattered small acute infarcts in the bilateral MCA, right PCA, and bilateral PICA territories.  CXR patchy airspace disease, particularly in the left lung. Findings are concerning for pneumonia or aspiration.   Pertinent Vitals Pain Assessment: Faces Faces Pain Scale: No hurt  SLP Plan  Continue with current plan of care;MBS    Recommendations Diet recommendations: NPO              Follow up Recommendations: Inpatient Rehab Plan: Continue with current plan of care;MBS    GO     Melissa [name] 09/26/2014, 2:38 PM

## 2014-09-26 NOTE — Progress Notes (Addendum)
ANTICOAGULATION CONSULT NOTE - Initial Consult  Pharmacy Consult for Lovenox Indication: VTE prophylaxis , large acute L MCA stroke and numerous scattered small acute infarcts    Allergies  Allergen Reactions  . Morphine Nausea And Vomiting    Patient Measurements: Height: 5\' 5"  (165.1 cm) Weight: 239 lb 13.8 oz (108.8 kg) IBW/kg (Calculated) : 57  Vital Signs: Temp: 97.7 F (36.5 C) (10/05 0945) Temp Source: Axillary (10/05 0945) BP: 161/60 mmHg (10/05 0945) Pulse Rate: 83 (10/05 0945)  Labs:  Recent Labs  09/24/14 0442 09/25/14 0422 09/26/14 0657  HGB 10.8*  --   --   HCT 30.9*  --   --   PLT 238  --   --   CREATININE 1.76* 1.63* 1.46*    Estimated Creatinine Clearance: 45.2 ml/min (by C-G formula based on Cr of 1.46).   Medical History: Past Medical History  Diagnosis Date  . Vertigo, intermittent   . Hypothyroidism   . Hyperlipidemia   . Obesity   . Hypertension     severe and resistant to treatment; 2008-negative left renal angiogram  . Vitiligo   . Degenerative disc disease, lumbar   . Degenerative disc disease, cervical   . Chest pain 2008    2008-normal coronary angiography  . Renal cell carcinoma     Right nephrectomy    Medications:  Prescriptions prior to admission  Medication Sig Dispense Refill  . acyclovir (ZOVIRAX) 400 MG tablet Take 1 tablet (400 mg total) by mouth daily.  60 tablet  3  . amLODipine (NORVASC) 10 MG tablet Take 10 mg by mouth every morning.      Marland Kitchen aspirin 81 MG chewable tablet Chew 81 mg by mouth daily.      Marland Kitchen atenolol (TENORMIN) 25 MG tablet Take 25 mg by mouth every morning.       . Bortezomib (VELCADE IJ) Inject as directed. Days 1, 4, 8, 11 every 21 days      . budesonide-formoterol (SYMBICORT) 160-4.5 MCG/ACT inhaler Inhale 2 puffs into the lungs daily as needed (Shortness Of Breath).       . calcitRIOL (ROCALTROL) 0.25 MCG capsule 0.25 mcg every Monday, Wednesday, and Friday.       Marland Kitchen dexamethasone (DECADRON) 4 MG  tablet Take 10 mg by mouth as directed. Take a dose the day before and the day after Velcade treatments.      Marland Kitchen FLUoxetine (PROZAC) 10 MG tablet Take 10 mg by mouth every morning.      . fluticasone (FLONASE) 50 MCG/ACT nasal spray Place 2 sprays into both nostrils daily.  16 g  6  . furosemide (LASIX) 40 MG tablet Take 40 mg by mouth 2 (two) times daily.      . hydrALAZINE (APRESOLINE) 25 MG tablet Take 25 mg by mouth 3 (three) times daily.       . hydrALAZINE (APRESOLINE) 50 MG tablet Take 50 mg by mouth 3 (three) times daily.      Marland Kitchen lenalidomide (REVLIMID) 25 MG capsule Take 25 mg by mouth as directed. Take 1 capsule daily for 14 days, rest 7 days, and repeat.      . levothyroxine (SYNTHROID, LEVOTHROID) 100 MCG tablet Take 100 mcg by mouth every Monday, Wednesday, and Friday.      . levothyroxine (SYNTHROID, LEVOTHROID) 50 MCG tablet Take 50 mcg by mouth every Tuesday, Thursday, Saturday, and Sunday.      . lovastatin (MEVACOR) 40 MG tablet Take 40 mg by mouth daily.      Marland Kitchen  meclizine (ANTIVERT) 25 MG tablet Take 25 mg by mouth 3 (three) times daily as needed for dizziness.      . Multiple Vitamin (MULTIVITAMIN WITH MINERALS) TABS tablet Take 1 tablet by mouth daily.      . naproxen sodium (ALEVE) 220 MG tablet Take 220 mg by mouth 2 (two) times daily as needed.      . prochlorperazine (COMPAZINE) 10 MG tablet Take 1 tablet (10 mg total) by mouth every 6 (six) hours as needed for nausea or vomiting.  60 tablet  1  . spironolactone (ALDACTONE) 25 MG tablet Take 50 mg by mouth daily.        Scheduled:  . ampicillin-sulbactam (UNASYN) IV  3 g Intravenous Q6H  . antiseptic oral rinse  7 mL Mouth Rinse q12n4p  . aspirin  300 mg Rectal Daily   Or  . aspirin  325 mg Oral Daily  . carvedilol  6.25 mg Oral BID WC  . chlorhexidine  15 mL Mouth Rinse BID  . cloNIDine  0.2 mg Transdermal Weekly  . fluticasone  2 spray Each Nare Daily  . free water  100 mL Per Tube 6 times per day  . insulin aspart   0-9 Units Subcutaneous 6 times per day  . ipratropium-albuterol  3 mL Nebulization BID  . levothyroxine  50 mcg Intravenous Daily  . pantoprazole (PROTONIX) IV  40 mg Intravenous Q12H    Assessment: 68 y.o obese female to begin lovenox for VTE prophylaxis.  Admitted to [facility] 9/27 and transferred to [facility] 09/19/14 for large acute L MCA stroke and numerous scattered small acute infarcts in the bilateral MCA, right PCA, and bilateral PICA territories with right hemiparesis.  Initially patient was on SCDs for VTE prophylaxis due to  thrombocytopenia.  MD notes thrombocytopenia resolved and likley due to chemotherapy. Pt had just ended first cycle of chemo prior to admission, on a 7 day rest.  History of renal cell carcinoma s/p right nephrectomy, multiple myeloma on recent chemotherapy. Dr. Allyson Sabal did not see any evidence of HIT thus has requested pharmacy to dose lovenox for VTE prophylaxis.  Chart reviewed and I do not see any notations or labs pointing to HIT.  PLTC has improved, 238K on 09/24/14. Maximum lovenox dose for VTE prophylaxis is $RemoveBefore'40mg'[id number]$  SQ daily in stroke patient even if BMI >30.  BMI =39.5 kg/m2 SCR 1.46, estimated CrCl ~ 33 ml/min using IBW 57 kg or CrCl ~ 45 ml/min using ABW 71 kg   Goal of Therapy:  AntiXa level 0.3-0.6 units/ml 4 hours after LMWH dose given. Monitor platelets by anticoagulation protocol: Yes   Plan:  Lovenox 40 mg SQ q24h for VTE prophylaxis (acute CVA). Monitor CBC and for signs/symptoms of bleeding.  Nicole Cella, RPh Clinical Pharmacist Pager: [phone] 09/26/2014,1:26 PM

## 2014-09-26 NOTE — Progress Notes (Signed)
Patient Melissa [name]      DOB: Aug 26, 1946      CVE:[id number]  Supposed to meet with daughter this afternoon but she was unable to make it.  Spoke with Dr Allyson Sabal who was able to update family. She appears to have been able to have some success with swallow eval, but concerns remain around her ability to take in enough fluid to manage her hypernatremia.  Sister had some idea about her general health but not much knowledge around her multiple myeloma.  Overall, she continues to face a number of major health challenges with her MM and CVA history. Daughter is supposed to be here tomorrow morning and I will try to re-engage with her then.    Doran Clay D.O. Palliative Medicine Team at [facility]  Pager: [phone] Team Phone: [phone]

## 2014-09-27 ENCOUNTER — Inpatient Hospital Stay (HOSPITAL_COMMUNITY): Payer: Medicare Other

## 2014-09-27 LAB — COMPREHENSIVE METABOLIC PANEL
ALK PHOS: 61 U/L (ref 39–117)
ALT: 32 U/L (ref 0–35)
AST: 15 U/L (ref 0–37)
Albumin: 2.3 g/dL — ABNORMAL LOW (ref 3.5–5.2)
Anion gap: 9 (ref 5–15)
BUN: 44 mg/dL — ABNORMAL HIGH (ref 6–23)
CHLORIDE: 113 meq/L — AB (ref 96–112)
CO2: 26 meq/L (ref 19–32)
Calcium: 9 mg/dL (ref 8.4–10.5)
Creatinine, Ser: 1.33 mg/dL — ABNORMAL HIGH (ref 0.50–1.10)
GFR calc Af Amer: 46 mL/min — ABNORMAL LOW (ref >90)
GFR, EST NON AFRICAN AMERICAN: 40 mL/min — AB (ref >90)
GLUCOSE: 203 mg/dL — AB (ref 70–99)
POTASSIUM: 4.2 meq/L (ref 3.7–5.3)
SODIUM: 148 meq/L — AB (ref 137–147)
Total Bilirubin: 0.3 mg/dL (ref 0.3–1.2)
Total Protein: 5.5 g/dL — ABNORMAL LOW (ref 6.0–8.3)

## 2014-09-27 LAB — BLOOD GAS, ARTERIAL
ACID-BASE EXCESS: 1.5 mmol/L (ref 0.0–2.0)
Bicarbonate: 25.1 mEq/L — ABNORMAL HIGH (ref 20.0–24.0)
Drawn by: 24486
O2 Saturation: 97.9 %
PATIENT TEMPERATURE: 98.6
TCO2: 26.2 mmol/L (ref 0–100)
pCO2 arterial: 36.9 mmHg (ref 35.0–45.0)
pH, Arterial: 7.448 (ref 7.350–7.450)
pO2, Arterial: 109 mmHg — ABNORMAL HIGH (ref 80.0–100.0)

## 2014-09-27 LAB — CULTURE, BLOOD (ROUTINE X 2)
Culture: NO GROWTH
Culture: NO GROWTH

## 2014-09-27 LAB — CBC
HEMATOCRIT: 31.2 % — AB (ref 36.0–46.0)
HEMOGLOBIN: 10.8 g/dL — AB (ref 12.0–15.0)
MCH: 30.9 pg (ref 26.0–34.0)
MCHC: 34.6 g/dL (ref 30.0–36.0)
MCV: 89.1 fL (ref 78.0–100.0)
Platelets: 257 10*3/uL (ref 150–400)
RBC: 3.5 MIL/uL — ABNORMAL LOW (ref 3.87–5.11)
RDW: 17.1 % — ABNORMAL HIGH (ref 11.5–15.5)
WBC: 8.3 10*3/uL (ref 4.0–10.5)

## 2014-09-27 LAB — GLUCOSE, CAPILLARY
GLUCOSE-CAPILLARY: 205 mg/dL — AB (ref 70–99)
Glucose-Capillary: 164 mg/dL — ABNORMAL HIGH (ref 70–99)
Glucose-Capillary: 174 mg/dL — ABNORMAL HIGH (ref 70–99)
Glucose-Capillary: 177 mg/dL — ABNORMAL HIGH (ref 70–99)
Glucose-Capillary: 191 mg/dL — ABNORMAL HIGH (ref 70–99)
Glucose-Capillary: 193 mg/dL — ABNORMAL HIGH (ref 70–99)

## 2014-09-27 MED ORDER — CLONIDINE HCL 0.3 MG/24HR TD PTWK
0.3000 mg | MEDICATED_PATCH | TRANSDERMAL | Status: DC
  Administered 2014-09-27: 0.3 mg via TRANSDERMAL
  Filled 2014-09-27 (×3): qty 1

## 2014-09-27 NOTE — Progress Notes (Signed)
Physical Therapy Treatment Patient Details Name: Delta Pichon Thoman MRN: [id number] DOB: 10-03-1946 Today's Date: 09/27/2014    History of Present Illness Pt is a 68 y.o female presenting s/p L MCA ischemic CVA with R sided weakness and R facial droop. TPA was not administered. Hx of  intermittent vertigo, hypothyroidism, hyperlipidemia, obesity, HTN, R nephrectomy am renal cell carcinoma. Pt receiving chemotherapy treatment for multiple myeloma.    PT Comments    Patient is not arousing to stimuli this PM. Daughter reports that patient has been less aroused  Since yesterday.    Follow Up Recommendations  SNF     Equipment Recommendations       Recommendations for Other Services       Precautions / Restrictions Precautions Precautions: Fall    Mobility  Bed Mobility               General bed mobility comments: repositioned  pTIENT'S HEAD TO MORE NEUTRAL.  Transfers                    Ambulation/Gait                 Stairs            Wheelchair Mobility    Modified Rankin (Stroke Patients Only)       Balance                                    Cognition Arousal/Alertness: Lethargic                     General Comments: pt. did not arouse to stimuli of cold cloth, daughter's  attempts.    Exercises Other Exercises Other Exercises: Educated daughter on need o keep R hand elevated to decrease dependent edema Other Exercises: R hand prom, l ue REASTRAINT REMOVED FOR prom THEN REAPPLIED. PATIENT DID NOT ASSIST  MOVING IT.    General Comments        Pertinent Vitals/Pain Faces Pain Scale: No hurt    Home Living                      Prior Function            PT Goals (current goals can now be found in the care plan section) Progress towards PT goals: Not progressing toward goals - comment (PT UNRESPONSIVE)    Frequency  Min 3X/week    PT Plan Current plan remains appropriate     Co-evaluation             End of Session   Activity Tolerance: Patient limited by lethargy Patient left: in bed;with bed alarm set;with call bell/phone within reach;with family/visitor present;with restraints reapplied     Time: 1735-6701 PT Time Calculation (min): 16 min  Charges:  $Neuromuscular Re-education: 8-22 mins                    G Codes:      Marcelino Freestone PT [phone]  09/27/2014, 4:34 PM

## 2014-09-27 NOTE — Progress Notes (Signed)
SLP Cancellation Note  Patient Details Name: Melissa [name] MRN: [id number] DOB: 09-19-1946   Cancelled treatment:       Reason Eval/Treat Not Completed: Fatigue/lethargy limiting ability to participate;Patient's level of consciousness. Pt brought to the the radiology suite and positioned in chair. She was unable to maintain arousal for PO's despite maximum verbal, visual, and tactile cueing. SLP educated the pt's daughter about the possiblity of repeat testing when appropriate and informed her that we would continue to follow the case.  [name] 09/27/2014, 10:47 AM

## 2014-09-27 NOTE — Progress Notes (Signed)
[facility] PROGRESS NOTE  Melissa [name] SWF:[id number] DOB: June 18, 1946 DOA: 09/18/2014 PCP: [name], MD  Assessment/Plan: Principal Problem:   CVA (cerebral infarction) Active Problems:   Aspiration pneumonia   Thrombocytopenia, unspecified   Elevated troponin   CKD (chronic kidney disease) stage 3, GFR 30-59 ml/min   Assessment/Plan:  Principal Problem:  Large Acute L MCA stroke and numerous scattered small acute infarcts in the bilateral MCA, right PCA, and bilateral PICA territories with right hemiparesis  - Findings consistent with cardiac/proximal aortic emboli vs coagulopathy from chemotherapy, malignancy;  MRI Large lefe MCA, but also Numerous scattered small acute infarcts in the bilateral MCA, right PCA, and bilateral PICA territories  MRA L MCA M1 occlusion  Carotid Doppler No significant stenosis  2D Echo No source of embolus  TEE DID not show cardiac source of emboli, no intracardiac thrombus, there was a PFO  LE venous doppler - negative  aspirin 81 mg orally every day prior to admission, now on aspirin 300 mg suppository  HgbA1c 6.4  SCDs for VTE prophylaxis  Continue tube feeds , pulled out dophoff Twice in the last 4 days , may need a PEG tube due to high risk of dehydration (worsening hypernatremia ) , worsening nutritional status,  requested interventional radiology to place this .- daughter wants to hold off  concerns remain around her ability to take in enough fluid to manage her hypernatremia She also needs a loop recorder by EP before discharge , this has been placed on hold because of patient's multiple comorbidities Daughter missed her meeting with palliative care yesterday, hopefully they will meet today and make decisions    Disposition  Had a long discussion with daughter regarding prognosis , inability to maintain nutrition and hydration by self feeding even if initiated on feeding by mouth, without the help of dophoff tube or a PEG   Agreeable to palliative care due to multiple comordities  Strongly recommend DO NOT RESUSCITATE and Comfort Care  This was discussed with the patient's daughter and sister    Aspiration pneumonia  Discontinued clindamycin , completed Unasyn on 10/5 after a total of 8 days of treatment for aspiration pneumonia Patient to have a modified been followed today  Hypernatremia  Worsened on half normal saline Switched  to D5W 10/4 Patient remains hypernatremic Sodium worsening ,poor prognostic sign    Dysphagia: Residual dysphagia from CVA  - Currently on tube feeds, speech therapy following closely, if no meaningful improvement, discussed in detail with patient's daughter at the bedside regarding PEG tube ,  Nutrition following    Diastolic CHF  -Due to permissive hypertension, restart Coreg   Progressive renal failure  On IV fluids  Mildly Elevated Troponin  -Likely due to massive stroke. No EKG changes. Have since normalized . 2-D echo showed EF of 60-65%, no regional wall motion abnormalities.   Thrombocythemia  -Likley due to chemotherapy, improving  l  Multiple myeloma  -Patient just ended one cycle of treatment and is on a planned 7 day rest - doubt her candidacy to return to active tx, unless dramatic improvement in clinical status occurs   Hypertension  Continue Coreg , increase IV hydralazine  Started clonidine patch , increase dose of clonidine as blood pressure still uncontrolled  Hypothyroid  Continue IV Synthroid, at 50 mcg daily,    DVT Prophylaxis:b start lovenox  Code Status: Full code  Family Communication: Discussed in detail with patient's daughter at the bedside  Disposition:continue on neuro floor  Consultants:  Neurology  Cardiology for TEE Procedures:  9/27 CT head without contrast Large LEFT MCA territory infarct involving LEFT temporal and  parietal lobes.  -Old small high RIGHT frontal & questionable LEFT cerebellar infarcts.  9/27 PCXR  Patchy airspace disease,>left lung. C/W pneumonia vs aspiration vs pulmonary edema  9/28 MRI/MRA head without contrast Large acute left MCA infarct. Cytotoxic edema and mass effect but no associated hemorrhage at this time.  -Numerous scattered small acute infarcts in the bilateral MCA/right PCA/bilateral PICA territories. most  C/W embolic event (cardiac or proximal aortic source).  -Left MCA occlusion in the M1 segment.  9/28 echocardiogram with contrast Left ventricle: mild LVH. -LVEF= 60% to 65%. -(grade 1 diastolic dysfunction). - Right ventricle: mildly dilated. - Right atrium: mildly dilated.  - Pulmonary arteries: PA peak pressure: 52 mm Hg (S).    Antibiotics:  Clindamycin 9/27>  Unasyn  Code Status: full  Family Communication: family updated about patient's clinical progress  Disposition Plan: She will need SNF , goals of care meeting with palliative care in progress   Brief narrative:  68 y.o. female with a past medical history significant for HTN, hyperlipidemia, renal cell carcinoma s/p right nephrectomy, multiple myeloma on chemotherapy, transferred to [facility] for further stroke management. Patient has altered mental status and seems to be aphasic, family is unavailable, and thus all clinical information was obtained from the chart. She was initially evaluated at [facility] where notes indicated that " she was brought to the hospital by EMS due to altered mental status. The patient was found by her daughter at approximately 2 PM. She had fallen on the ground at an unknown time and was found on the floor by her daughter. She was altered at that point and had weakness of her right side including right facial droop and was not verbalizing. She was last known to be well last night. Code stroke was called alone neurology determined not to proceed with TPA due to unknown time of onset". At time of consult, she is lethargic but open eyes on verbal commands. On aspirin. last known well unknown.   MRI/MRA brain" large acute left MCA infarct. Cytotoxic edema and mass effect but no associated hemorrhage at this time. Numerous scattered small acute infarcts in the bilateral MCA, right PCA, and bilateral PICA territories.  Constellation most compatible with recent embolic event (cardiac or proximal aortic source). Left MCA occlusion in the M1 segment. Some reconstituted flow only in the anterior left MCA division.  Patient was not administered TPA secondary to delay in arrival. She was admitted to a step down unit for further evaluation and treatment.  Further workup including a TEE showed a PFO Currently main issue is following,Patient has a NG tube in place through which she is receiving tube feeds She needs a PEG if family wants to do everything and she remains a full code vs comfort care Patient developed hypernatremia despite getting half normal saline   Consultants:  Neurology  Cardiology  Procedures:  TEE  Antibiotics:  None    HPI/Subjective: Patient somnolent, daughter states that the patient has been more somnolent today  Objective: Filed Vitals:   09/26/14 2300 09/27/14 0300 09/27/14 0634 09/27/14 0845  BP: 159/53 185/71 140/87   Pulse: 78 75 76   Temp:  97.8 F (36.6 C) 97.7 F (36.5 C)   TempSrc:  Axillary Axillary   Resp: 20 22 22    Height:      Weight:      SpO2:  100% 99% 100% 99%   No intake or output data in the 24 hours ending 09/27/14 1017  Exam: General: Somnolent combative  Cardiovascular: RRR, nl S1 s2  Respiratory: Decreased breath sounds at the bases, scattered rhonchi, no crackles  Abdomen: soft +BS NT/ND, no masses palpable  Extremities: No cyanosis and no edema  Neurologic-no response of withdrawal to pain, confused, hard to assess, not responding        Data Reviewed: Basic Metabolic Panel:  Recent Labs Lab 09/20/14 1525 09/20/14 2155 09/21/14 0347  09/23/14 0529 09/24/14 0442 09/25/14 0422 09/26/14 0657 09/27/14 0857  NA  141  --  143  < > 146 150* 152* 148* 148*  K 3.6*  --  4.2  < > 3.7 3.8 4.3 4.9 4.2  CL 103  --  105  < > 108 114* 116* 114* 113*  CO2 24  --  23  < > 24 24 23 22 26   GLUCOSE 94  --  120*  < > 197* 225* 203* 231* 203*  BUN 25*  --  29*  < > 52* 59* 58* 55* 44*  CREATININE 1.59*  --  1.71*  < > 1.72* 1.76* 1.63* 1.46* 1.33*  CALCIUM 8.8  --  9.0  < > 9.6 9.3 9.4 9.2 9.0  MG 2.2 2.2 2.3  --   --   --   --   --   --   < > = values in this interval not displayed.  Liver Function Tests:  Recent Labs Lab 09/23/14 0529 09/24/14 0442 09/25/14 0422 09/26/14 0657 09/27/14 0857  AST 12 9 13 20 15   ALT 44* 33 29 32 32  ALKPHOS 46 46 49 60 61  BILITOT 0.5 0.4 0.5 0.3 0.3  PROT 6.7 6.4 6.2 6.0 5.5*  ALBUMIN 2.6* 2.5* 2.5* 2.5* 2.3*   No results found for this basename: LIPASE, AMYLASE,  in the last 168 hours No results found for this basename: AMMONIA,  in the last 168 hours  CBC:  Recent Labs Lab 09/20/14 1525 09/21/14 0347 09/22/14 0348 09/24/14 0442 09/27/14 0719  WBC 10.7* 11.1* 9.6 10.7* 8.3  NEUTROABS 8.1* 8.8*  --   --   --   HGB 10.0* 10.9* 10.6* 10.8* 10.8*  HCT 28.7* 31.1* 30.0* 30.9* 31.2*  MCV 88.3 88.6 88.8 87.8 89.1  PLT 39* 54* 100* 238 257    Cardiac Enzymes:  Recent Labs Lab 09/20/14 2155 09/21/14 0347 09/21/14 1142  TROPONINI <0.30 <0.30 <0.30   BNP (last 3 results)  Recent Labs  09/19/14 1004  PROBNP 1843.0*     CBG:  Recent Labs Lab 09/26/14 1154 09/26/14 1657 09/26/14 2009 09/26/14 2357 09/27/14 0349  GLUCAP 219* 255* 208* 174* 191*    Recent Results (from the past 240 hour(s))  MRSA PCR SCREENING     Status: None   Collection Time    09/18/14  6:13 PM      Result Value Ref Range Status   MRSA by PCR NEGATIVE  NEGATIVE Final   Comment:            The GeneXpert MRSA Assay (FDA     approved for NASAL specimens     only), is one component of a     comprehensive MRSA colonization     surveillance program. It is not      intended to diagnose MRSA     infection nor to guide or     monitor treatment for  MRSA infections.  CULTURE, RESPIRATORY (NON-EXPECTORATED)     Status: None   Collection Time    09/20/14  8:42 PM      Result Value Ref Range Status   Specimen Description TRACHEAL ASPIRATE   Final   Special Requests Normal   Final   Gram Stain     Final   Value: FEW WBC PRESENT,BOTH PMN AND MONONUCLEAR     FEW SQUAMOUS EPITHELIAL CELLS PRESENT     MODERATE GRAM NEGATIVE RODS     FEW GRAM POSITIVE COCCI     IN PAIRS   Culture     Final   Value: Non-Pathogenic Oropharyngeal-type Flora Isolated.     Performed at [facility]   Report Status 09/23/2014 FINAL   Final  CULTURE, BLOOD (ROUTINE X 2)     Status: None   Collection Time    09/20/14  9:55 PM      Result Value Ref Range Status   Specimen Description BLOOD RIGHT ARM   Final   Special Requests     Final   Value: BOTTLES DRAWN AEROBIC AND ANAEROBIC 10CC AEROBIC 5CC ANAEROBIC   Culture  Setup Time     Final   Value: 09/21/2014 03:28     Performed at [facility]   Culture     Final   Value: NO GROWTH 5 DAYS     Performed at [facility]   Report Status 09/27/2014 FINAL   Final  CULTURE, BLOOD (ROUTINE X 2)     Status: None   Collection Time    09/20/14 10:05 PM      Result Value Ref Range Status   Specimen Description BLOOD RIGHT HAND   Final   Special Requests BOTTLES DRAWN AEROBIC ONLY 10CC   Final   Culture  Setup Time     Final   Value: 09/21/2014 03:29     Performed at [facility]   Culture     Final   Value: NO GROWTH 5 DAYS     Performed at [facility]   Report Status 09/27/2014 FINAL   Final     Studies: Ct Abdomen Pelvis Wo Contrast  09/21/2014   CLINICAL DATA:  Evaluate for septic emboli. History of renal cell cancer, multiple myeloma.  EXAM: CT CHEST, ABDOMEN AND PELVIS WITHOUT CONTRAST  TECHNIQUE: Multidetector CT imaging of the chest, abdomen and pelvis was performed following  the standard protocol without IV contrast.  COMPARISON:  CT 05/11/2014 and 04/20/2014.  FINDINGS: CT CHEST FINDINGS  Thoracic aorta normal caliber. Atherosclerotic vascular changes noted stable cardiomegaly. Coronary artery disease.  Shotty mediastinal lymph nodes.  Thoracic esophagus is unremarkable.  Endobronchial lesion is noted in the right mainstem bronchus, most likely mucous plugging. Similar findings noted in the trachea to a lesser degree. New also severe bilateral pulmonary infiltrates most consistent with pneumonia. Pulmonary edema could present this fashion. Tiny pleural effusions. No pneumothorax.  Calcified right thyroid lobe mass again noted. Chest wall is intact.  CT ABDOMEN AND PELVIS FINDINGS  Liver normal. Spleen normal. Pancreas normal. No biliary distention. The gallbladder is nondistended.  Right nephrectomy. Right surgical bed is unremarkable. No mass lesion. Left kidney is unremarkable. No hydronephrosis or obstructing ureteral stone. Foley catheter is noted in the bladder. Air is noted bladder pelvis is most likely from instrumentation. Uterus and adnexa are unremarkable.  No significant adenopathy. Aortoiliac atherosclerotic vascular disease.  Appendiceal region is normal. There is no bowel distention. No free  air. No mesenteric mass. Mild subcutaneous edema noted over the lower flanks. Developing mild anasarca could present in this fashion. Correlation to exclude cellulitis or developing hematoma suggested.  Diffuse severe thoracolumbar spine degenerative change. Sclerotic changes about the SI joints are stable.  IMPRESSION: 1. New severe bilateral pulmonary infiltrates most consistent with pneumonia. Pulmonary edema could present in this fashion. 2. Endotracheal and right endobronchial soft tissue densities, most likely mucous plugging. Possibility of a and bronchial primary or metastatic lesion cannot be excluded. 3. Right nephrectomy. Nephrectomy bed appears stable. No recurrent mass.  4. Mild subcutaneous edema noted over the lower flanks. Developing obstructive could present in this fashion. Clinical correlation is is suggested to exclude developing cellulitis or developing hematomas in these regions.   Electronically Signed   By: Maisie Fus  Register   On: 09/20/2014 21:55   Ct Head Wo Contrast  09/18/2014   CLINICAL DATA:  Code stroke, altered level of consciousness  EXAM: CT HEAD WITHOUT CONTRAST  TECHNIQUE: Contiguous axial images were obtained from the base of the skull through the vertex without intravenous contrast.  COMPARISON:  None  FINDINGS: Cavum septum pellucidum and vergae.  Otherwise normal ventricular morphology.  No midline shift.  Old high RIGHT frontal infarct.  Large acute LEFT MCA territory infarct involving the LEFT temporal and parietal lobes with effacement of sulci.  No definite dense middle cerebral artery sign.  No intracranial hemorrhage, mass lesion or extra-axial fluid collections.  Low-attenuation at posterior aspect of LEFT cerebellar hemisphere may represent a small old infarct as well.  Minimal small vessel chronic ischemic changes of deep cerebral white matter.  No acute bone or sinus findings.  Bony excrescences are identified at the sphenoid wings bilaterally potentially developmental variants or exostoses though difficult to completely exclude calcified meningiomas, considered much less likely.  IMPRESSION: Large LEFT MCA territory infarct involving LEFT temporal and parietal lobes.  No acute intracranial hemorrhage or midline shift.  Old small high RIGHT frontal and questionable LEFT cerebellar infarcts.  Critical Value/emergent results were called by telephone at the time of interpretation on 09/18/2014 at 1556 hr to Dr. Donnetta Hutching, who verbally acknowledged these results.   Electronically Signed   By: Ulyses Southward M.D.   On: 09/18/2014 15:58   Ct Chest Wo Contrast  09/20/2014   CLINICAL DATA:  Evaluate for septic emboli. History of renal cell cancer,  multiple myeloma.  EXAM: CT CHEST, ABDOMEN AND PELVIS WITHOUT CONTRAST  TECHNIQUE: Multidetector CT imaging of the chest, abdomen and pelvis was performed following the standard protocol without IV contrast.  COMPARISON:  CT 05/11/2014 and 04/20/2014.  FINDINGS: CT CHEST FINDINGS  Thoracic aorta normal caliber. Atherosclerotic vascular changes noted stable cardiomegaly. Coronary artery disease.  Shotty mediastinal lymph nodes.  Thoracic esophagus is unremarkable.  Endobronchial lesion is noted in the right mainstem bronchus, most likely mucous plugging. Similar findings noted in the trachea to a lesser degree. New also severe bilateral pulmonary infiltrates most consistent with pneumonia. Pulmonary edema could present this fashion. Tiny pleural effusions. No pneumothorax.  Calcified right thyroid lobe mass again noted. Chest wall is intact.  CT ABDOMEN AND PELVIS FINDINGS  Liver normal. Spleen normal. Pancreas normal. No biliary distention. The gallbladder is nondistended.  Right nephrectomy. Right surgical bed is unremarkable. No mass lesion. Left kidney is unremarkable. No hydronephrosis or obstructing ureteral stone. Foley catheter is noted in the bladder. Air is noted bladder pelvis is most likely from instrumentation. Uterus and adnexa are unremarkable.  No significant adenopathy.  Aortoiliac atherosclerotic vascular disease.  Appendiceal region is normal. There is no bowel distention. No free air. No mesenteric mass. Mild subcutaneous edema noted over the lower flanks. Developing mild anasarca could present in this fashion. Correlation to exclude cellulitis or developing hematoma suggested.  Diffuse severe thoracolumbar spine degenerative change. Sclerotic changes about the SI joints are stable.  IMPRESSION: 1. New severe bilateral pulmonary infiltrates most consistent with pneumonia. Pulmonary edema could present in this fashion. 2. Endotracheal and right endobronchial soft tissue densities, most likely mucous  plugging. Possibility of a and bronchial primary or metastatic lesion cannot be excluded. 3. Right nephrectomy. Nephrectomy bed appears stable. No recurrent mass. 4. Mild subcutaneous edema noted over the lower flanks. Developing obstructive could present in this fashion. Clinical correlation is is suggested to exclude developing cellulitis or developing hematomas in these regions.   Electronically Signed   By: Maisie Fus  Register   On: 09/20/2014 21:55   Mr Maxine Glenn Head Wo Contrast  09/19/2014   ADDENDUM REPORT: 09/19/2014 10:07  ADDENDUM: Study discussed by telephone with Dr. Kerry Hough On 09/19/2014 at 0958 hrs.   Electronically Signed   By: Augusto Gamble M.D.   On: 09/19/2014 10:07   09/19/2014   CLINICAL DATA:  68 year old female with left MCA infarct detected by CT presenting with code stroke, altered mental status. Initial encounter.  EXAM: MRI HEAD WITHOUT CONTRAST  MRA HEAD WITHOUT CONTRAST  TECHNIQUE: Multiplanar, multiecho pulse sequences of the brain and surrounding structures were obtained without intravenous contrast. Angiographic images of the head were obtained using MRA technique without contrast.  COMPARISON:  Head CT without contrast 09/18/2014.  FINDINGS: MRI HEAD FINDINGS  Large confluent and intensely restricted diffusion in the left hemisphere extending from the insula and operculum posteriorly, involving most of the left occipital lobe as well as the posterior temporal lobe. Small nodular areas of restricted diffusion in the left basal ganglia, which otherwise are spared. Superimposed multiple bilateral punctate and nodular foci of restricted diffusion, including involvement of the right MCA and PCA territories. Furthermore there are multiple nodular foci restricted diffusion in both cerebellar hemispheres. See series 100.  Cytotoxic edema in the affected territories. Still, intracranial mass effect is Mild at this time, with Trace rightward midline shift. No definite petechial hemorrhage.  Major  intracranial vascular flow voids are preserved.  Cavum septum pellucidum. Mass effect on the left lateral ventricle. No ventriculomegaly. Basilar cisterns remain patent. Negative pituitary, cervicomedullary junction, and grossly negative visualized cervical spine. Chronic lacunar infarcts superimposed in the right corona radiata and right superior frontal gyrus subcortical white matter (with small focus of cortical encephalomalacia).  Visualized orbit soft tissues are within normal limits. Visualized paranasal sinuses and mastoids are clear. Normal bone marrow signal. No acute scalp soft tissue findings.  MRA HEAD FINDINGS  Antegrade flow in the posterior circulation with codominant distal vertebral arteries. Patent vertebrobasilar junction. Right PICA origin is patent. Left PICA less well visualized. Basilar artery irregularity without hemodynamically significant stenosis. Both PCA origins are patent. The right posterior communicating artery is present, the left is diminutive or absent. Bilateral PCA branches are within normal limits.  Antegrade flow in both ICA siphons. Bilateral siphon irregularity is moderate. No definite hemodynamically significant ICA siphon stenosis. Patent carotid termini. Normal right MCA and bilateral ACA origins.  The left MCA is occluded in the M1 segment 10 mm beyond its origin. Paucity of flow signal only in the left MCA anterior division M2 branches.  Anterior communicating artery diminutive or absent. Visualized  bilateral ACA branches are within normal limits. Right MCA M1 segment and bifurcation are patent with mild to moderate irregularity. No definite M2 branch occlusion on the right.  IMPRESSION: 1. Large acute left MCA infarct. Cytotoxic edema and mass effect but no associated hemorrhage at this time. 2. Numerous scattered small acute infarcts in the bilateral MCA, right PCA, and bilateral PICA territories. Constellation most compatible with recent embolic event (cardiac or  proximal aortic source). 3. Left MCA occlusion in the M1 segment. Some reconstituted flow only in the anterior left MCA division. 4. No other major circle of Willis branch occlusion. Moderate irregularity of both ICA siphons.  Electronically Signed: By: Augusto Gamble M.D. On: 09/19/2014 09:44   Mr Brain Wo Contrast  09/19/2014   ADDENDUM REPORT: 09/19/2014 10:07  ADDENDUM: Study discussed by telephone with Dr. Kerry Hough On 09/19/2014 at 0958 hrs.   Electronically Signed   By: Augusto Gamble M.D.   On: 09/19/2014 10:07   09/19/2014   CLINICAL DATA:  68 year old female with left MCA infarct detected by CT presenting with code stroke, altered mental status. Initial encounter.  EXAM: MRI HEAD WITHOUT CONTRAST  MRA HEAD WITHOUT CONTRAST  TECHNIQUE: Multiplanar, multiecho pulse sequences of the brain and surrounding structures were obtained without intravenous contrast. Angiographic images of the head were obtained using MRA technique without contrast.  COMPARISON:  Head CT without contrast 09/18/2014.  FINDINGS: MRI HEAD FINDINGS  Large confluent and intensely restricted diffusion in the left hemisphere extending from the insula and operculum posteriorly, involving most of the left occipital lobe as well as the posterior temporal lobe. Small nodular areas of restricted diffusion in the left basal ganglia, which otherwise are spared. Superimposed multiple bilateral punctate and nodular foci of restricted diffusion, including involvement of the right MCA and PCA territories. Furthermore there are multiple nodular foci restricted diffusion in both cerebellar hemispheres. See series 100.  Cytotoxic edema in the affected territories. Still, intracranial mass effect is Mild at this time, with Trace rightward midline shift. No definite petechial hemorrhage.  Major intracranial vascular flow voids are preserved.  Cavum septum pellucidum. Mass effect on the left lateral ventricle. No ventriculomegaly. Basilar cisterns remain patent.  Negative pituitary, cervicomedullary junction, and grossly negative visualized cervical spine. Chronic lacunar infarcts superimposed in the right corona radiata and right superior frontal gyrus subcortical white matter (with small focus of cortical encephalomalacia).  Visualized orbit soft tissues are within normal limits. Visualized paranasal sinuses and mastoids are clear. Normal bone marrow signal. No acute scalp soft tissue findings.  MRA HEAD FINDINGS  Antegrade flow in the posterior circulation with codominant distal vertebral arteries. Patent vertebrobasilar junction. Right PICA origin is patent. Left PICA less well visualized. Basilar artery irregularity without hemodynamically significant stenosis. Both PCA origins are patent. The right posterior communicating artery is present, the left is diminutive or absent. Bilateral PCA branches are within normal limits.  Antegrade flow in both ICA siphons. Bilateral siphon irregularity is moderate. No definite hemodynamically significant ICA siphon stenosis. Patent carotid termini. Normal right MCA and bilateral ACA origins.  The left MCA is occluded in the M1 segment 10 mm beyond its origin. Paucity of flow signal only in the left MCA anterior division M2 branches.  Anterior communicating artery diminutive or absent. Visualized bilateral ACA branches are within normal limits. Right MCA M1 segment and bifurcation are patent with mild to moderate irregularity. No definite M2 branch occlusion on the right.  IMPRESSION: 1. Large acute left MCA infarct. Cytotoxic edema and  mass effect but no associated hemorrhage at this time. 2. Numerous scattered small acute infarcts in the bilateral MCA, right PCA, and bilateral PICA territories. Constellation most compatible with recent embolic event (cardiac or proximal aortic source). 3. Left MCA occlusion in the M1 segment. Some reconstituted flow only in the anterior left MCA division. 4. No other major circle of Willis branch  occlusion. Moderate irregularity of both ICA siphons.  Electronically Signed: By: Augusto Gamble M.D. On: 09/19/2014 09:44   US Soft Tissue Head/neck  08/31/2014   CLINICAL DATA:  thyroid nodule. FNA biopsy of dominant right nodule 09/07/2013.  EXAM: THYROID ULTRASOUND  TECHNIQUE: Ultrasound examination of the thyroid gland and adjacent soft tissues was performed.  COMPARISON:  08/04/2013  FINDINGS: Right thyroid lobe  Measurements: 50 x 23 x 32 mm. Dominant 36 x 20 x 25 mm complex solid nodule, mid lobe (previously 19 x 26 x 29). Adjacent 9 x 6 mm complex cyst at its inferior margin (previously 6 x 8 x 9).  Left thyroid lobe  Measurements: 48 x 18 x 27 mm. Markedly inhomogeneous background echotexture. Poorly marginated 14 x 7 x 10 mm hypoechoic lesion, mid lobe (previously 7 x 9 x 8). 8 x 6 x 9 mm complex cyst, inferior pole.  Isthmus  Thickness: 3 mm.  No nodules visualized.  Lymphadenopathy  None visualized.  IMPRESSION: 1. Thyromegaly with bilateral nodules. Enlargement of dominant right lesion; correlate with previous biopsy results. No other lesion meets current consensus criteria for biopsy. Follow-up by clinical exam is recommended. If patient has known risk factors for thyroid carcinoma, consider follow-up ultrasound in 12 months. If patient is clinically hyperthyroid, consider nuclear medicine thyroid uptake and scan. This recommendation follows the consensus statement: Management of Thyroid Nodules Detected as [place]: Society of Radiologists in Ultrasound Consensus Conference Statement. Radiology 2005; [id number].   Electronically Signed   By: Oley Balm M.D.   On: 08/31/2014 14:30   Dg Chest Port 1 View  09/21/2014   CLINICAL DATA:  68 year old female with shortness of breath. Left MCA infarct, aspiration pneumonia.  EXAM: PORTABLE CHEST - 1 VIEW  COMPARISON:  09/18/2014 and prior chest radiographs dating back to 05/08/2000  FINDINGS: Cardiomegaly again noted.  Elevation of the right hemidiaphragm and  bibasilar atelectasis again noted.  Pulmonary vascular congestion again identified.  There has been interval improvement in left lung aeration.  There is no evidence of pneumothorax.  No other significant change identified.  IMPRESSION: Improved left lung aeration without other significant change.   Electronically Signed   By: Laveda Abbe M.D.   On: 09/21/2014 08:15   Dg Chest Portable 1 View  09/18/2014   CLINICAL DATA:  Stroke.  EXAM: PORTABLE CHEST - 1 VIEW  COMPARISON:  Chest CT 05/11/2014  FINDINGS: Single view of the chest demonstrates low lung volumes. Patchy airspace densities in the left lung. There may be subtle airspace disease in the right lung. Heart size is accentuated by the low lung volumes.  IMPRESSION: Patchy airspace disease, particularly in the left lung. Findings are concerning for pneumonia or aspiration. Asymmetric pulmonary edema is also in the differential diagnosis.   Electronically Signed   By: Richarda Overlie M.D.   On: 09/18/2014 17:10   Dg Abd Portable 1v  09/24/2014   CLINICAL DATA:  Assess feeding tube position  EXAM: PORTABLE ABDOMEN - 1 VIEW  COMPARISON:  Portable chest x-ray of September 24, 2014  FINDINGS: The radiodense feeding tube projects along the inferior border  of the twelfth rib in the region of the distal stomach. It is little changed since yesterday's study. The bowel gas pattern is nonspecific.  IMPRESSION: The radiodense feeding tube tip overlies the pyloric region of the stomach.   Electronically Signed   By: David  [place]   On: 09/24/2014 13:40   Dg Abd Portable 1v  09/24/2014   CLINICAL DATA:  Panda tube placement  EXAM: PORTABLE ABDOMEN - 1 VIEW  COMPARISON:  09/23/2014  FINDINGS: Interval advancement of weighted tip enteric tube with tip now overlying the expected location of the gastric antrum.  Paucity of bowel gas without evidence of obstruction.  No supine evidence of pneumoperitoneum. No pneumatosis or portal venous gas.  Limited visualization of the adjacent  thorax demonstrates minimal bibasilar opacities, left greater than right.  No acute osseus abnormalities.  IMPRESSION: Interval advancement of and weighted tip enteric tube, now overlying the expected location of the gastric antrum.   Electronically Signed   By: Simonne Come M.D.   On: 09/24/2014 12:18   Dg Abd Portable 1v  09/23/2014   CLINICAL DATA:  Nasogastric tube placement.  EXAM: PORTABLE ABDOMEN - 1 VIEW  COMPARISON:  None.  FINDINGS: Feeding tube tip terminates just beyond the gastroesophageal junction. Bowel gas pattern is unremarkable. Basilar lung opacities are incidentally imaged.  IMPRESSION: Feeding tube tip terminates just beyond the gastroesophageal junction.   Electronically Signed   By: Leanna Battles M.D.   On: 09/23/2014 18:47    Scheduled Meds: . antiseptic oral rinse  7 mL Mouth Rinse q12n4p  . aspirin  300 mg Rectal Daily   Or  . aspirin  325 mg Oral Daily  . carvedilol  6.25 mg Oral BID WC  . chlorhexidine  15 mL Mouth Rinse BID  . cloNIDine  0.3 mg Transdermal Weekly  . enoxaparin (LOVENOX) injection  40 mg Subcutaneous Q24H  . fluticasone  2 spray Each Nare Daily  . free water  100 mL Per Tube 6 times per day  . insulin aspart  0-9 Units Subcutaneous 6 times per day  . ipratropium-albuterol  3 mL Nebulization BID  . levothyroxine  50 mcg Intravenous Daily  . pantoprazole (PROTONIX) IV  40 mg Intravenous Q12H   Continuous Infusions: . dextrose 75 mL/hr at 09/27/14 0642  . feeding supplement (GLUCERNA 1.2 CAL) 1,000 mL (09/26/14 1257)    Principal Problem:   CVA (cerebral infarction) Active Problems:   Aspiration pneumonia   Thrombocytopenia, unspecified   Elevated troponin   CKD (chronic kidney disease) stage 3, GFR 30-59 ml/min    Time spent: 40 minutes   [facility]  [facility] Pager [phone]. If 7PM-7AM, please contact night-coverage at [email or web address], password [facility] 09/27/2014, 10:17 AM  LOS: 9 days

## 2014-09-27 NOTE — Progress Notes (Signed)
Patient ZO:[id number] L Gullo      DOB: Jul 06, 1946      UJW:[id number]   Palliative Medicine Team at [facility] Progress Note    Subjective: Drowsy this afternoon, nonverbal. Not consistently following commands and unable to obtain ROS    Filed Vitals:   09/27/14 1048  BP: 180/56  Pulse: 82  Temp: 97.3 F (36.3 C)  Resp: 22   Physical exam: FAO:ZHYQMVH but arouses easily HEENT: [place], sclera anciteric CV: RRR LUNGS: CTAB ABD: soft SKIN: warm/dry   CBC    Component Value Date/Time   WBC 8.3 09/27/2014 0719   RBC 3.50* 09/27/2014 0719   RBC 3.70* 07/31/2011 1245   HGB 10.8* 09/27/2014 0719   HCT 31.2* 09/27/2014 0719   PLT 257 09/27/2014 0719   MCV 89.1 09/27/2014 0719   MCH 30.9 09/27/2014 0719   MCHC 34.6 09/27/2014 0719   RDW 17.1* 09/27/2014 0719   LYMPHSABS 1.2 09/21/2014 0347   MONOABS 1.0 09/21/2014 0347   EOSABS 0.1 09/21/2014 0347   BASOSABS 0.0 09/21/2014 0347    CMP     Component Value Date/Time   NA 148* 09/27/2014 0857   K 4.2 09/27/2014 0857   CL 113* 09/27/2014 0857   CO2 26 09/27/2014 0857   GLUCOSE 203* 09/27/2014 0857   BUN 44* 09/27/2014 0857   CREATININE 1.33* 09/27/2014 0857   CREATININE 1.91* 07/21/2014 1103   CALCIUM 9.0 09/27/2014 0857   PROT 5.5* 09/27/2014 0857   ALBUMIN 2.3* 09/27/2014 0857   AST 15 09/27/2014 0857   ALT 32 09/27/2014 0857   ALKPHOS 61 09/27/2014 0857   BILITOT 0.3 09/27/2014 0857   GFRNONAA 40* 09/27/2014 0857   GFRNONAA 36* 03/10/2014 1005   GFRAA 46* 09/27/2014 0857   GFRAA 42* 03/10/2014 1005      Assessment and plan: 68 yo female with PMHx of CVA, RCC s/p nephrectomy, diastolic HF, multiple myeloma who presented with acute left MCA stroke.   1. Code Status: Full   2. Goals of Care:  See initial consult. Met with [name] today. She had chance to talk with multiple physicians and prior to our meeting has decided to go ahead and pursue PEG tube. We discussed issues her mother will face regarding uncertain neurologic recovery and  difficulties Francines comorbid conditions will make for treating her myeloma.  She acknowledges this and expresses some emotion at the site of things not going well over next days to weeks or even months. I encouraged her to have conversations about this with her sister. These events have hit them hard because they just learned of myeloma diagnosis about 1 month ago.  We talked about hoping for best but preparing for worst and making sure nursing home/ feeding tube were helping to achieve mom's goals and give her quality to her life. If not meeting family goals then needs to be re-addressed.  She will talk with family. At this time, family wishes to pursue PEG and rehab placement.   3. Symptom Management:  Anxiety/Agitation: Waxing/weaning. Sleep wake cycles may be off. Can consider trazodone or mirtazapine if more of an issue. Remove restraints as able. Could consider haldol PRN for agitated delirium.       4. Psychosocial/Spiritual: 2 daughters Elmarie Shiley and [name] very involved in her care. Very strong spiritual/faith background through family.  Lots of prayer regarding moms condition.      Time In: 1150 Time Out: 1220 Total Time: 30 minutes  >50% of time spent in counseling and coordination  of care as above  Orvis Brill D.O. Palliative Medicine Team at [facility]  Pager: [phone] Team Phone: [phone]

## 2014-09-27 NOTE — Progress Notes (Signed)
Supervised and reviewed by Rashon Rezek MA CCC-SLP  

## 2014-09-27 NOTE — Clinical Social Work Note (Signed)
Clinical Social Worker continues to follow patient to provide continued support and facilitate patient discharge needs. Pending outcome of Palliative Care meeting [facility] may not take the patient. CSW has a pending bed at [facility].   Shannon, MSW, [place]

## 2014-09-28 ENCOUNTER — Encounter (HOSPITAL_COMMUNITY): Payer: Self-pay | Admitting: Radiology

## 2014-09-28 ENCOUNTER — Inpatient Hospital Stay (HOSPITAL_COMMUNITY): Payer: Medicare Other

## 2014-09-28 DIAGNOSIS — R0609 Other forms of dyspnea: Secondary | ICD-10-CM

## 2014-09-28 DIAGNOSIS — I5031 Acute diastolic (congestive) heart failure: Secondary | ICD-10-CM

## 2014-09-28 LAB — COMPREHENSIVE METABOLIC PANEL
ALT: 35 U/L (ref 0–35)
AST: 22 U/L (ref 0–37)
Albumin: 2.1 g/dL — ABNORMAL LOW (ref 3.5–5.2)
Alkaline Phosphatase: 67 U/L (ref 39–117)
Anion gap: 11 (ref 5–15)
BUN: 29 mg/dL — ABNORMAL HIGH (ref 6–23)
CO2: 25 meq/L (ref 19–32)
Calcium: 8.4 mg/dL (ref 8.4–10.5)
Chloride: 109 mEq/L (ref 96–112)
Creatinine, Ser: 1.11 mg/dL — ABNORMAL HIGH (ref 0.50–1.10)
GFR calc Af Amer: 58 mL/min — ABNORMAL LOW (ref >90)
GFR calc non Af Amer: 50 mL/min — ABNORMAL LOW (ref >90)
Glucose, Bld: 223 mg/dL — ABNORMAL HIGH (ref 70–99)
Potassium: 4.3 mEq/L (ref 3.7–5.3)
SODIUM: 145 meq/L (ref 137–147)
TOTAL PROTEIN: 5.2 g/dL — AB (ref 6.0–8.3)
Total Bilirubin: 0.3 mg/dL (ref 0.3–1.2)

## 2014-09-28 LAB — GLUCOSE, CAPILLARY
GLUCOSE-CAPILLARY: 217 mg/dL — AB (ref 70–99)
GLUCOSE-CAPILLARY: 132 mg/dL — AB (ref 70–99)
Glucose-Capillary: 161 mg/dL — ABNORMAL HIGH (ref 70–99)
Glucose-Capillary: 177 mg/dL — ABNORMAL HIGH (ref 70–99)
Glucose-Capillary: 194 mg/dL — ABNORMAL HIGH (ref 70–99)

## 2014-09-28 MED ORDER — CEFAZOLIN SODIUM-DEXTROSE 2-3 GM-% IV SOLR
2.0000 g | INTRAVENOUS | Status: AC
  Administered 2014-09-29: 2 g via INTRAVENOUS
  Filled 2014-09-28: qty 50

## 2014-09-28 MED ORDER — GLUCERNA 1.2 CAL PO LIQD
1000.0000 mL | ORAL | Status: DC
  Administered 2014-09-30: 1000 mL
  Filled 2014-09-28 (×6): qty 1000

## 2014-09-28 MED ORDER — PRO-STAT SUGAR FREE PO LIQD
30.0000 mL | ORAL | Status: DC
  Administered 2014-09-30 – 2014-10-01 (×2): 30 mL via ORAL
  Filled 2014-09-28 (×2): qty 30

## 2014-09-28 NOTE — Progress Notes (Signed)
Patient ID: Melissa [name]  female  WGN:[id number]    DOB: 1946-12-17    DOA: 09/18/2014  PCP: [name], MD  Admit HPI / Brief Narrative:  68 y.o F Hx HTN, hyperlipidemia, renal cell carcinoma s/p right nephrectomy, pulmonary fibrosis, multiple myeloma on chemotherapy (Revlimid 25 mg days 1-14 every 21 days / Velcade D1, 4, 8, and 11 every 21 days / dexamethasone) last seen by Dr. Alla German (Oncology) on 9/21, who was transferred to [facility] for further stroke management. Patient has altered mental status and is aphasic.  She was initially evaluated at [facility] where notes indicated that she was brought to the hospital by EMS due to altered mental status. The patient was found by her daughter at approximately 2 PM. She had fallen on the ground at an unknown time and was found on the floor by her daughter. She was altered at that point and had weakness of her right side including right facial droop and was not verbalizing. She was last known to be well the previous night. Code stroke was called and Neurology determined not to proceed with TPA due to unknown time of onset.  MRI/MRA brain showed large acute left MCA infarct. Cytotoxic edema and mass effect but no associated hemorrhage at this time. Numerous scattered small acute infarcts in the bilateral MCA, right PCA, and bilateral PICA territories. Constellation most compatible with recent embolic event (cardiac or proximal aortic source).  Further workup including a TEE showed a PFO. patient has a NG tube in place through which she is receiving tube feeds. Patient's family requested a PEG tube placement, IR consulted. Patient developed hypernatremia despite getting half normal saline     Assessment/Plan: Principal Problem: Large Acute L MCA stroke and numerous scattered small acute infarcts in the bilateral MCA, right PCA, and bilateral PICA territories with right hemiparesis - Findings consistent with cardiac/proximal aortic emboli vs  coagulopathy from chemotherapy; no findings on TTE -Patient underwent TEE which did not show cardiac source of emboli, no intracardiac thrombus, there was a PFO  - LE venous Dopplers negative for DVT.  - Patient was on aspirin 81 mg daily prior to admission, now on aspirin 300mg  suppository - Continue tube feeds, IR consulted for PEG tube placement - She also needs a loop recorder by EP before discharge, this has been placed on hold because of patient's multiple comorbidities  Active Problems:   Aspiration pneumonia - Discontinued clindamycin , completed Unasyn on 10/5 after a total of 8 days of treatment for aspiration pneumonia - Will check portable chest x-ray today  Dysphagia: Residual dysphagia from CVA -Awaiting PEG tube placement  Diastolic CHF  -Due to permissive hypertension, restarted Coreg  Progressive renal failure, hyperatremia improving -Recheck BMET, cont D5   Mildly Elevated Troponin  -Likely due to massive stroke. No EKG changes. Have since normalized . 2-D echo showed EF of 60-65%, no regional wall motion abnormalities.  Thrombocythemia  -Likley due to chemotherapy, improving l  Multiple myeloma  -Patient just ended one cycle of treatment and is on a planned 7 day rest - doubt her candidacy to return to active tx, unless dramatic improvement in clinical status occurs   Hypertension  -Hold antihypertensives to maintain cranial perfusion - BP improved today   Hypothyroid  Continue IV Synthroid    DVT Prophylaxis: Lovenox  Code Status: Full code  Family Communication:   Disposition: Awaiting PEG tube placement  Consultants:  Neurology  Cardiology for TEE  Procedures: 9/27 CT head  without contrast Large LEFT MCA territory infarct involving LEFT temporal and  parietal lobes.  -Old small high RIGHT frontal & questionable LEFT cerebellar infarcts.  9/27 PCXR Patchy airspace disease,>left lung. C/W pneumonia vs aspiration vs Asymmetric pulmonary edema    9/28 MRI/MRA head without contrast Large acute left MCA infarct. Cytotoxic edema and mass effect but no associated hemorrhage at this time.  -Numerous scattered small acute infarcts in the bilateral MCA/right PCA/bilateral PICA territories. most  C/W embolic event (cardiac or proximal aortic source).  -Left MCA occlusion in the M1 segment.  9/28 echocardiogram with contrast Left ventricle: mild LVH. -LVEF= 60% to 65%. -(grade 1 diastolic dysfunction). - Right ventricle: mildly dilated. - Right atrium: mildly dilated.  - Pulmonary arteries: PA peak pressure: 52 mm Hg (S).      Antibiotics:  Clindamycin 9/27>    Subjective: Patient seen and examined, patient with lethargy, unable to give any exam  Objective: Weight change:  No intake or output data in the 24 hours ending 09/28/14 1000 Blood pressure 206/86, pulse 73, temperature 97.8 F (36.6 C), temperature source Axillary, resp. rate 22, height 5\' 5"  (1.651 m), weight 108.8 kg (239 lb 13.8 oz), SpO2 100.00%.  Physical Exam: General: somnolent, lethargic CVS: S1-S2 clear, no murmur rubs or gallops Chest: Coarse breath sounds Abdomen: Obese soft nontender, nondistended, normal bowel sounds  Extremities: no cyanosis, clubbing or edema noted bilaterally Neuro not cooperating with exam  Lab Results: Basic Metabolic Panel:  Recent Labs Lab 09/26/14 0657 09/27/14 0857  NA 148* 148*  K 4.9 4.2  CL 114* 113*  CO2 22 26  GLUCOSE 231* 203*  BUN 55* 44*  CREATININE 1.46* 1.33*  CALCIUM 9.2 9.0   Liver Function Tests:  Recent Labs Lab 09/26/14 0657 09/27/14 0857  AST 20 15  ALT 32 32  ALKPHOS 60 61  BILITOT 0.3 0.3  PROT 6.0 5.5*  ALBUMIN 2.5* 2.3*   No results found for this basename: LIPASE, AMYLASE,  in the last 168 hours No results found for this basename: AMMONIA,  in the last 168 hours CBC:  Recent Labs Lab 09/24/14 0442 09/27/14 0719  WBC 10.7* 8.3  HGB 10.8* 10.8*  HCT 30.9* 31.2*  MCV 87.8  89.1  PLT 238 257   Cardiac Enzymes:  Recent Labs Lab 09/21/14 1142  TROPONINI <0.30   BNP: No components found with this basename: POCBNP,  CBG:  Recent Labs Lab 09/27/14 1148 09/27/14 1601 09/27/14 1959 09/28/14 0007 09/28/14 0402  GLUCAP 177* 205* 164* 194* 217*     Micro Results: Recent Results (from the past 240 hour(s))  MRSA PCR SCREENING     Status: None   Collection Time    09/18/14  6:13 PM      Result Value Ref Range Status   MRSA by PCR NEGATIVE  NEGATIVE Final   Comment:            The GeneXpert MRSA Assay (FDA     approved for NASAL specimens     only), is one component of a     comprehensive MRSA colonization     surveillance program. It is not     intended to diagnose MRSA     infection nor to guide or     monitor treatment for     MRSA infections.  CULTURE, RESPIRATORY (NON-EXPECTORATED)     Status: None   Collection Time    09/20/14  8:42 PM      Result Value Ref Range  Status   Specimen Description TRACHEAL ASPIRATE   Final   Special Requests Normal   Final   Gram Stain     Final   Value: FEW WBC PRESENT,BOTH PMN AND MONONUCLEAR     FEW SQUAMOUS EPITHELIAL CELLS PRESENT     MODERATE GRAM NEGATIVE RODS     FEW GRAM POSITIVE COCCI     IN PAIRS   Culture     Final   Value: Non-Pathogenic Oropharyngeal-type Flora Isolated.     Performed at [facility]   Report Status 09/23/2014 FINAL   Final  CULTURE, BLOOD (ROUTINE X 2)     Status: None   Collection Time    09/20/14  9:55 PM      Result Value Ref Range Status   Specimen Description BLOOD RIGHT ARM   Final   Special Requests     Final   Value: BOTTLES DRAWN AEROBIC AND ANAEROBIC 10CC AEROBIC 5CC ANAEROBIC   Culture  Setup Time     Final   Value: 09/21/2014 03:28     Performed at [facility]   Culture     Final   Value: NO GROWTH 5 DAYS     Performed at [facility]   Report Status 09/27/2014 FINAL   Final  CULTURE, BLOOD (ROUTINE X 2)     Status: None    Collection Time    09/20/14 10:05 PM      Result Value Ref Range Status   Specimen Description BLOOD RIGHT HAND   Final   Special Requests BOTTLES DRAWN AEROBIC ONLY 10CC   Final   Culture  Setup Time     Final   Value: 09/21/2014 03:29     Performed at [facility]   Culture     Final   Value: NO GROWTH 5 DAYS     Performed at [facility]   Report Status 09/27/2014 FINAL   Final    Studies/Results: Ct Abdomen Pelvis Wo Contrast  09/21/2014   CLINICAL DATA:  Evaluate for septic emboli. History of renal cell cancer, multiple myeloma.  EXAM: CT CHEST, ABDOMEN AND PELVIS WITHOUT CONTRAST  TECHNIQUE: Multidetector CT imaging of the chest, abdomen and pelvis was performed following the standard protocol without IV contrast.  COMPARISON:  CT 05/11/2014 and 04/20/2014.  FINDINGS: CT CHEST FINDINGS  Thoracic aorta normal caliber. Atherosclerotic vascular changes noted stable cardiomegaly. Coronary artery disease.  Shotty mediastinal lymph nodes.  Thoracic esophagus is unremarkable.  Endobronchial lesion is noted in the right mainstem bronchus, most likely mucous plugging. Similar findings noted in the trachea to a lesser degree. New also severe bilateral pulmonary infiltrates most consistent with pneumonia. Pulmonary edema could present this fashion. Tiny pleural effusions. No pneumothorax.  Calcified right thyroid lobe mass again noted. Chest wall is intact.  CT ABDOMEN AND PELVIS FINDINGS  Liver normal. Spleen normal. Pancreas normal. No biliary distention. The gallbladder is nondistended.  Right nephrectomy. Right surgical bed is unremarkable. No mass lesion. Left kidney is unremarkable. No hydronephrosis or obstructing ureteral stone. Foley catheter is noted in the bladder. Air is noted bladder pelvis is most likely from instrumentation. Uterus and adnexa are unremarkable.  No significant adenopathy. Aortoiliac atherosclerotic vascular disease.  Appendiceal region is normal. There is no  bowel distention. No free air. No mesenteric mass. Mild subcutaneous edema noted over the lower flanks. Developing mild anasarca could present in this fashion. Correlation to exclude cellulitis or developing hematoma suggested.  Diffuse severe thoracolumbar  spine degenerative change. Sclerotic changes about the SI joints are stable.  IMPRESSION: 1. New severe bilateral pulmonary infiltrates most consistent with pneumonia. Pulmonary edema could present in this fashion. 2. Endotracheal and right endobronchial soft tissue densities, most likely mucous plugging. Possibility of a and bronchial primary or metastatic lesion cannot be excluded. 3. Right nephrectomy. Nephrectomy bed appears stable. No recurrent mass. 4. Mild subcutaneous edema noted over the lower flanks. Developing obstructive could present in this fashion. Clinical correlation is is suggested to exclude developing cellulitis or developing hematomas in these regions.   Electronically Signed   By: Maisie Fus  Register   On: 09/20/2014 21:55   Ct Head Wo Contrast  09/18/2014   CLINICAL DATA:  Code stroke, altered level of consciousness  EXAM: CT HEAD WITHOUT CONTRAST  TECHNIQUE: Contiguous axial images were obtained from the base of the skull through the vertex without intravenous contrast.  COMPARISON:  None  FINDINGS: Cavum septum pellucidum and vergae.  Otherwise normal ventricular morphology.  No midline shift.  Old high RIGHT frontal infarct.  Large acute LEFT MCA territory infarct involving the LEFT temporal and parietal lobes with effacement of sulci.  No definite dense middle cerebral artery sign.  No intracranial hemorrhage, mass lesion or extra-axial fluid collections.  Low-attenuation at posterior aspect of LEFT cerebellar hemisphere may represent a small old infarct as well.  Minimal small vessel chronic ischemic changes of deep cerebral white matter.  No acute bone or sinus findings.  Bony excrescences are identified at the sphenoid wings  bilaterally potentially developmental variants or exostoses though difficult to completely exclude calcified meningiomas, considered much less likely.  IMPRESSION: Large LEFT MCA territory infarct involving LEFT temporal and parietal lobes.  No acute intracranial hemorrhage or midline shift.  Old small high RIGHT frontal and questionable LEFT cerebellar infarcts.  Critical Value/emergent results were called by telephone at the time of interpretation on 09/18/2014 at 1556 hr to Dr. Donnetta Hutching, who verbally acknowledged these results.   Electronically Signed   By: Ulyses Southward M.D.   On: 09/18/2014 15:58   Ct Chest Wo Contrast  09/20/2014   CLINICAL DATA:  Evaluate for septic emboli. History of renal cell cancer, multiple myeloma.  EXAM: CT CHEST, ABDOMEN AND PELVIS WITHOUT CONTRAST  TECHNIQUE: Multidetector CT imaging of the chest, abdomen and pelvis was performed following the standard protocol without IV contrast.  COMPARISON:  CT 05/11/2014 and 04/20/2014.  FINDINGS: CT CHEST FINDINGS  Thoracic aorta normal caliber. Atherosclerotic vascular changes noted stable cardiomegaly. Coronary artery disease.  Shotty mediastinal lymph nodes.  Thoracic esophagus is unremarkable.  Endobronchial lesion is noted in the right mainstem bronchus, most likely mucous plugging. Similar findings noted in the trachea to a lesser degree. New also severe bilateral pulmonary infiltrates most consistent with pneumonia. Pulmonary edema could present this fashion. Tiny pleural effusions. No pneumothorax.  Calcified right thyroid lobe mass again noted. Chest wall is intact.  CT ABDOMEN AND PELVIS FINDINGS  Liver normal. Spleen normal. Pancreas normal. No biliary distention. The gallbladder is nondistended.  Right nephrectomy. Right surgical bed is unremarkable. No mass lesion. Left kidney is unremarkable. No hydronephrosis or obstructing ureteral stone. Foley catheter is noted in the bladder. Air is noted bladder pelvis is most likely from  instrumentation. Uterus and adnexa are unremarkable.  No significant adenopathy. Aortoiliac atherosclerotic vascular disease.  Appendiceal region is normal. There is no bowel distention. No free air. No mesenteric mass. Mild subcutaneous edema noted over the lower flanks. Developing mild anasarca could  present in this fashion. Correlation to exclude cellulitis or developing hematoma suggested.  Diffuse severe thoracolumbar spine degenerative change. Sclerotic changes about the SI joints are stable.  IMPRESSION: 1. New severe bilateral pulmonary infiltrates most consistent with pneumonia. Pulmonary edema could present in this fashion. 2. Endotracheal and right endobronchial soft tissue densities, most likely mucous plugging. Possibility of a and bronchial primary or metastatic lesion cannot be excluded. 3. Right nephrectomy. Nephrectomy bed appears stable. No recurrent mass. 4. Mild subcutaneous edema noted over the lower flanks. Developing obstructive could present in this fashion. Clinical correlation is is suggested to exclude developing cellulitis or developing hematomas in these regions.   Electronically Signed   By: Maisie Fus  Register   On: 09/20/2014 21:55   Mr Maxine Glenn Head Wo Contrast  09/19/2014   ADDENDUM REPORT: 09/19/2014 10:07  ADDENDUM: Study discussed by telephone with Dr. Kerry Hough On 09/19/2014 at 0958 hrs.   Electronically Signed   By: Augusto Gamble M.D.   On: 09/19/2014 10:07   09/19/2014   CLINICAL DATA:  68 year old female with left MCA infarct detected by CT presenting with code stroke, altered mental status. Initial encounter.  EXAM: MRI HEAD WITHOUT CONTRAST  MRA HEAD WITHOUT CONTRAST  TECHNIQUE: Multiplanar, multiecho pulse sequences of the brain and surrounding structures were obtained without intravenous contrast. Angiographic images of the head were obtained using MRA technique without contrast.  COMPARISON:  Head CT without contrast 09/18/2014.  FINDINGS: MRI HEAD FINDINGS  Large confluent and  intensely restricted diffusion in the left hemisphere extending from the insula and operculum posteriorly, involving most of the left occipital lobe as well as the posterior temporal lobe. Small nodular areas of restricted diffusion in the left basal ganglia, which otherwise are spared. Superimposed multiple bilateral punctate and nodular foci of restricted diffusion, including involvement of the right MCA and PCA territories. Furthermore there are multiple nodular foci restricted diffusion in both cerebellar hemispheres. See series 100.  Cytotoxic edema in the affected territories. Still, intracranial mass effect is Mild at this time, with Trace rightward midline shift. No definite petechial hemorrhage.  Major intracranial vascular flow voids are preserved.  Cavum septum pellucidum. Mass effect on the left lateral ventricle. No ventriculomegaly. Basilar cisterns remain patent. Negative pituitary, cervicomedullary junction, and grossly negative visualized cervical spine. Chronic lacunar infarcts superimposed in the right corona radiata and right superior frontal gyrus subcortical white matter (with small focus of cortical encephalomalacia).  Visualized orbit soft tissues are within normal limits. Visualized paranasal sinuses and mastoids are clear. Normal bone marrow signal. No acute scalp soft tissue findings.  MRA HEAD FINDINGS  Antegrade flow in the posterior circulation with codominant distal vertebral arteries. Patent vertebrobasilar junction. Right PICA origin is patent. Left PICA less well visualized. Basilar artery irregularity without hemodynamically significant stenosis. Both PCA origins are patent. The right posterior communicating artery is present, the left is diminutive or absent. Bilateral PCA branches are within normal limits.  Antegrade flow in both ICA siphons. Bilateral siphon irregularity is moderate. No definite hemodynamically significant ICA siphon stenosis. Patent carotid termini. Normal  right MCA and bilateral ACA origins.  The left MCA is occluded in the M1 segment 10 mm beyond its origin. Paucity of flow signal only in the left MCA anterior division M2 branches.  Anterior communicating artery diminutive or absent. Visualized bilateral ACA branches are within normal limits. Right MCA M1 segment and bifurcation are patent with mild to moderate irregularity. No definite M2 branch occlusion on the right.  IMPRESSION: 1. Large  acute left MCA infarct. Cytotoxic edema and mass effect but no associated hemorrhage at this time. 2. Numerous scattered small acute infarcts in the bilateral MCA, right PCA, and bilateral PICA territories. Constellation most compatible with recent embolic event (cardiac or proximal aortic source). 3. Left MCA occlusion in the M1 segment. Some reconstituted flow only in the anterior left MCA division. 4. No other major circle of Willis branch occlusion. Moderate irregularity of both ICA siphons.  Electronically Signed: By: Augusto Gamble M.D. On: 09/19/2014 09:44   Mr Brain Wo Contrast  09/19/2014   ADDENDUM REPORT: 09/19/2014 10:07  ADDENDUM: Study discussed by telephone with Dr. Kerry Hough On 09/19/2014 at 0958 hrs.   Electronically Signed   By: Augusto Gamble M.D.   On: 09/19/2014 10:07   09/19/2014   CLINICAL DATA:  68 year old female with left MCA infarct detected by CT presenting with code stroke, altered mental status. Initial encounter.  EXAM: MRI HEAD WITHOUT CONTRAST  MRA HEAD WITHOUT CONTRAST  TECHNIQUE: Multiplanar, multiecho pulse sequences of the brain and surrounding structures were obtained without intravenous contrast. Angiographic images of the head were obtained using MRA technique without contrast.  COMPARISON:  Head CT without contrast 09/18/2014.  FINDINGS: MRI HEAD FINDINGS  Large confluent and intensely restricted diffusion in the left hemisphere extending from the insula and operculum posteriorly, involving most of the left occipital lobe as well as the posterior  temporal lobe. Small nodular areas of restricted diffusion in the left basal ganglia, which otherwise are spared. Superimposed multiple bilateral punctate and nodular foci of restricted diffusion, including involvement of the right MCA and PCA territories. Furthermore there are multiple nodular foci restricted diffusion in both cerebellar hemispheres. See series 100.  Cytotoxic edema in the affected territories. Still, intracranial mass effect is Mild at this time, with Trace rightward midline shift. No definite petechial hemorrhage.  Major intracranial vascular flow voids are preserved.  Cavum septum pellucidum. Mass effect on the left lateral ventricle. No ventriculomegaly. Basilar cisterns remain patent. Negative pituitary, cervicomedullary junction, and grossly negative visualized cervical spine. Chronic lacunar infarcts superimposed in the right corona radiata and right superior frontal gyrus subcortical white matter (with small focus of cortical encephalomalacia).  Visualized orbit soft tissues are within normal limits. Visualized paranasal sinuses and mastoids are clear. Normal bone marrow signal. No acute scalp soft tissue findings.  MRA HEAD FINDINGS  Antegrade flow in the posterior circulation with codominant distal vertebral arteries. Patent vertebrobasilar junction. Right PICA origin is patent. Left PICA less well visualized. Basilar artery irregularity without hemodynamically significant stenosis. Both PCA origins are patent. The right posterior communicating artery is present, the left is diminutive or absent. Bilateral PCA branches are within normal limits.  Antegrade flow in both ICA siphons. Bilateral siphon irregularity is moderate. No definite hemodynamically significant ICA siphon stenosis. Patent carotid termini. Normal right MCA and bilateral ACA origins.  The left MCA is occluded in the M1 segment 10 mm beyond its origin. Paucity of flow signal only in the left MCA anterior division M2  branches.  Anterior communicating artery diminutive or absent. Visualized bilateral ACA branches are within normal limits. Right MCA M1 segment and bifurcation are patent with mild to moderate irregularity. No definite M2 branch occlusion on the right.  IMPRESSION: 1. Large acute left MCA infarct. Cytotoxic edema and mass effect but no associated hemorrhage at this time. 2. Numerous scattered small acute infarcts in the bilateral MCA, right PCA, and bilateral PICA territories. Constellation most compatible with recent embolic event (  cardiac or proximal aortic source). 3. Left MCA occlusion in the M1 segment. Some reconstituted flow only in the anterior left MCA division. 4. No other major circle of Willis branch occlusion. Moderate irregularity of both ICA siphons.  Electronically Signed: By: Augusto Gamble M.D. On: 09/19/2014 09:44   US Soft Tissue Head/neck  08/31/2014   CLINICAL DATA:  thyroid nodule. FNA biopsy of dominant right nodule 09/07/2013.  EXAM: THYROID ULTRASOUND  TECHNIQUE: Ultrasound examination of the thyroid gland and adjacent soft tissues was performed.  COMPARISON:  08/04/2013  FINDINGS: Right thyroid lobe  Measurements: 50 x 23 x 32 mm. Dominant 36 x 20 x 25 mm complex solid nodule, mid lobe (previously 19 x 26 x 29). Adjacent 9 x 6 mm complex cyst at its inferior margin (previously 6 x 8 x 9).  Left thyroid lobe  Measurements: 48 x 18 x 27 mm. Markedly inhomogeneous background echotexture. Poorly marginated 14 x 7 x 10 mm hypoechoic lesion, mid lobe (previously 7 x 9 x 8). 8 x 6 x 9 mm complex cyst, inferior pole.  Isthmus  Thickness: 3 mm.  No nodules visualized.  Lymphadenopathy  None visualized.  IMPRESSION: 1. Thyromegaly with bilateral nodules. Enlargement of dominant right lesion; correlate with previous biopsy results. No other lesion meets current consensus criteria for biopsy. Follow-up by clinical exam is recommended. If patient has known risk factors for thyroid carcinoma, consider  follow-up ultrasound in 12 months. If patient is clinically hyperthyroid, consider nuclear medicine thyroid uptake and scan. This recommendation follows the consensus statement: Management of Thyroid Nodules Detected as [place]: Society of Radiologists in Ultrasound Consensus Conference Statement. Radiology 2005; [id number].   Electronically Signed   By: Oley Balm M.D.   On: 08/31/2014 14:30   Dg Chest Port 1 View  09/21/2014   CLINICAL DATA:  68 year old female with shortness of breath. Left MCA infarct, aspiration pneumonia.  EXAM: PORTABLE CHEST - 1 VIEW  COMPARISON:  09/18/2014 and prior chest radiographs dating back to 05/08/2000  FINDINGS: Cardiomegaly again noted.  Elevation of the right hemidiaphragm and bibasilar atelectasis again noted.  Pulmonary vascular congestion again identified.  There has been interval improvement in left lung aeration.  There is no evidence of pneumothorax.  No other significant change identified.  IMPRESSION: Improved left lung aeration without other significant change.   Electronically Signed   By: Laveda Abbe M.D.   On: 09/21/2014 08:15   Dg Chest Portable 1 View  09/18/2014   CLINICAL DATA:  Stroke.  EXAM: PORTABLE CHEST - 1 VIEW  COMPARISON:  Chest CT 05/11/2014  FINDINGS: Single view of the chest demonstrates low lung volumes. Patchy airspace densities in the left lung. There may be subtle airspace disease in the right lung. Heart size is accentuated by the low lung volumes.  IMPRESSION: Patchy airspace disease, particularly in the left lung. Findings are concerning for pneumonia or aspiration. Asymmetric pulmonary edema is also in the differential diagnosis.   Electronically Signed   By: Richarda Overlie M.D.   On: 09/18/2014 17:10    Medications: Scheduled Meds: . antiseptic oral rinse  7 mL Mouth Rinse q12n4p  . aspirin  300 mg Rectal Daily   Or  . aspirin  325 mg Oral Daily  . carvedilol  6.25 mg Oral BID WC  . chlorhexidine  15 mL Mouth Rinse BID  . cloNIDine   0.3 mg Transdermal Weekly  . enoxaparin (LOVENOX) injection  40 mg Subcutaneous Q24H  . fluticasone  2 spray  Each Nare Daily  . free water  100 mL Per Tube 6 times per day  . insulin aspart  0-9 Units Subcutaneous 6 times per day  . ipratropium-albuterol  3 mL Nebulization BID  . levothyroxine  50 mcg Intravenous Daily  . pantoprazole (PROTONIX) IV  40 mg Intravenous Q12H      LOS: 10 days   Cailean Heacock M.D. [facility] 09/28/2014, 10:00 AM Pager: [phone]  If 7PM-7AM, please contact night-coverage [email or web address] Password [email or web address]

## 2014-09-28 NOTE — Progress Notes (Signed)
UR complete.  Traeh Milroy RN, MSN 

## 2014-09-28 NOTE — Progress Notes (Signed)
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 TPA was not administered. Hx of  intermittent vertigo, hypothyroidism, hyperlipidemia, obesity, HTN, R nephrectomy am renal cell carcinoma. Pt receiving chemotherapy treatment for multiple myeloma.   OT comments  Pt incontinent of bowel on arrival and very limited arousal. Pt requires reposition for eye opening for brief periods. Recommending Palliative consult to help with d/c planning. Pt currently with panda not in place and RN in room addressing. Ot to follow acutely for adl retraining.   Follow Up Recommendations  SNF;Supervision/Assistance - 24 hour    Equipment Recommendations  3 in 1 bedside comode;Wheelchair (measurements OT);Wheelchair cushion (measurements OT);Other (comment);Hospital bed (defer to SNF- hoyer lift)    Recommendations for Other Services      Precautions / Restrictions Precautions Precautions: Fall Precaution Comments: panda       Mobility Bed Mobility Overal bed mobility: Needs Assistance;+2 for physical assistance;+ 2 for safety/equipment Bed Mobility: Supine to Sit;Sit to Supine Rolling: +2 for physical assistance;Max assist Sidelying to sit: +2 for physical assistance;Max assist Supine to sit: +2 for physical assistance;Total assist Sit to supine: +2 for physical assistance;Total assist   General bed mobility comments: total +2 (A)   Transfers                      Balance Overall balance assessment: Needs assistance Sitting-balance support: No upper extremity supported;Feet supported Sitting balance-Leahy Scale: Zero Sitting balance - Comments: posterior lean  Postural control: Posterior lean                         ADL Overall ADL's : Needs  assistance/impaired Eating/Feeding: NPO Eating/Feeding Details (indicate cue type and reason): panda currently off and RN in room assessing placement Grooming: Total assistance   Upper Body Bathing: Total assistance   Lower Body Bathing: Total assistance                         General ADL Comments: PT supine to sit only due to arousal level and participation. Pt unable to sustain EOB sitting. Pt very limited participation but more aroused then previous PT session on 09/27/14      Vision                     Perception     Praxis      Cognition   Behavior During Therapy: Flat affect Overall Cognitive Status: Impaired/Different from baseline Area of Impairment: Attention;Awareness   Current Attention Level:  (aroused)    Following Commands:  (not following commands at all)   Awareness:  (not at an intellectual level yet) Problem Solving: Decreased initiation General Comments: Pt opening eyes with position change only. NOt following any commands Pt with decr arousal with prolonged time in same position    Extremity/Trunk Assessment               Exercises Other Exercises Other Exercises: R and L UE positioned on pillows and elevated to decr edema due to decr arousal   Shoulder Instructions       General Comments      Pertinent Vitals/ Pain       Pain Assessment: No/denies pain  Home Living  Prior Functioning/Environment              Frequency Min 2X/week     Progress Toward Goals  OT Goals(current goals can now be found in the care plan section)  Progress towards OT goals: Not progressing toward goals - comment (lethargic)  Acute Rehab OT Goals Patient Stated Goal: unable to state OT Goal Formulation: With family Time For Goal Achievement: 10/06/14 Potential to Achieve Goals: Good ADL Goals Pt Will Perform Grooming: with mod assist;sitting Pt Will Perform Upper Body  Bathing: with mod assist;sitting Pt Will Perform Lower Body Bathing: with mod assist;bed level Pt Will Transfer to Toilet: with +2 assist;with mod assist;bedside commode Additional ADL Goal #1: comlpete 2 step task during ADL activity with min facilitation of hand over hand to begin activity.  Plan Discharge plan remains appropriate;Other (comment) (Question Palliative consult)    Co-evaluation                 End of Session     Activity Tolerance Patient limited by lethargy   Patient Left in bed;in CPM;with bed alarm set;with nursing/sitter in room (Rn staff in room addressing panda)   Nurse Communication Mobility status;Need for lift equipment        Time: 6789-3810 OT Time Calculation (min): 21 min  Charges: OT General Charges $OT Visit: 1 Procedure OT Treatments $Self Care/Home Management : 8-22 mins  Harolyn Rutherford 09/28/2014, 1:13 PM Pager: [phone]

## 2014-09-28 NOTE — H&P (Signed)
Reason for Consult: Dysphagia Chief Complaint: Chief Complaint  Patient presents with  . Code Stroke    Referring Physician(s): TRH  History of Present Illness: Melissa [name] is a 68 y.o. female with recent acute large left MCA infarct and dysphagia, aspiration pneumonia s/p antibiotic course, multiple myeloma, HTN, CKD, RCC s/p right nephrectomy and hypernatremia that is resolving. IR received request for percutaneous gastrostomy tube. History is obtained per chart review as patient is lethargic on exam and no family is present.   Past Medical History  Diagnosis Date  . Vertigo, intermittent   . Hypothyroidism   . Hyperlipidemia   . Obesity   . Hypertension     severe and resistant to treatment; 2008-negative left renal angiogram  . Vitiligo   . Degenerative disc disease, lumbar   . Degenerative disc disease, cervical   . Chest pain 2008    2008-normal coronary angiography  . Renal cell carcinoma     Right nephrectomy    Past Surgical History  Procedure Laterality Date  . Tubal ligation  1983    Bilateral  . Nephrectomy      Right; secondary to renal cell carcinoma  . Colonoscopy N/A 04/21/2014    Procedure: COLONOSCOPY;  Surgeon: Rogene Houston, MD;  Location: [facility];  Service: Endoscopy;  Laterality: N/A;  . Colonoscopy N/A 04/21/2014    Procedure: COLONOSCOPY;  Surgeon: Rogene Houston, MD;  Location: [facility];  Service: Endoscopy;  Laterality: N/A;  1030  . Tee without cardioversion N/A 09/23/2014    Procedure: TRANSESOPHAGEAL ECHOCARDIOGRAM (TEE);  Surgeon: Sanda Klein, MD;  Location: [facility] ENDOSCOPY;  Service: Cardiovascular;  Laterality: N/A;    Allergies: Morphine  Medications: Prior to Admission medications   Medication Sig Start Date End Date Taking? Authorizing Provider  acyclovir (ZOVIRAX) 400 MG tablet Take 1 tablet (400 mg total) by mouth daily. 08/30/14  Yes Farrel Gobble, MD  amLODipine (NORVASC) 10 MG tablet Take 10 mg by mouth  every morning.   Yes Historical Provider, MD  aspirin 81 MG chewable tablet Chew 81 mg by mouth daily.   Yes Historical Provider, MD  atenolol (TENORMIN) 25 MG tablet Take 25 mg by mouth every morning.    Yes Historical Provider, MD  Bortezomib (VELCADE IJ) Inject as directed. Days 1, 4, 8, 11 every 21 days 09/05/14  Yes Historical Provider, MD  budesonide-formoterol (SYMBICORT) 160-4.5 MCG/ACT inhaler Inhale 2 puffs into the lungs daily as needed (Shortness Of Breath).    Yes Historical Provider, MD  calcitRIOL (ROCALTROL) 0.25 MCG capsule 0.25 mcg every Monday, Wednesday, and Friday.    Yes Historical Provider, MD  dexamethasone (DECADRON) 4 MG tablet Take 10 mg by mouth as directed. Take a dose the day before and the day after Velcade treatments.   Yes Historical Provider, MD  FLUoxetine (PROZAC) 10 MG tablet Take 10 mg by mouth every morning.   Yes Historical Provider, MD  fluticasone (FLONASE) 50 MCG/ACT nasal spray Place 2 sprays into both nostrils daily. 08/26/14  Yes Farrel Gobble, MD  furosemide (LASIX) 40 MG tablet Take 40 mg by mouth 2 (two) times daily.   Yes Historical Provider, MD  hydrALAZINE (APRESOLINE) 25 MG tablet Take 25 mg by mouth 3 (three) times daily.  06/26/12  Yes Fayrene Helper, MD  hydrALAZINE (APRESOLINE) 50 MG tablet Take 50 mg by mouth 3 (three) times daily.   Yes Historical Provider, MD  lenalidomide (REVLIMID) 25 MG capsule Take 25 mg by mouth  as directed. Take 1 capsule daily for 14 days, rest 7 days, and repeat. 08/30/14  Yes Farrel Gobble, MD  levothyroxine (SYNTHROID, LEVOTHROID) 100 MCG tablet Take 100 mcg by mouth every Monday, Wednesday, and Friday.   Yes Historical Provider, MD  levothyroxine (SYNTHROID, LEVOTHROID) 50 MCG tablet Take 50 mcg by mouth every Tuesday, Thursday, Saturday, and Sunday.   Yes Historical Provider, MD  lovastatin (MEVACOR) 40 MG tablet Take 40 mg by mouth daily.   Yes Historical Provider, MD  meclizine (ANTIVERT) 25 MG tablet Take  25 mg by mouth 3 (three) times daily as needed for dizziness.   Yes Historical Provider, MD  Multiple Vitamin (MULTIVITAMIN WITH MINERALS) TABS tablet Take 1 tablet by mouth daily.   Yes Historical Provider, MD  naproxen sodium (ALEVE) 220 MG tablet Take 220 mg by mouth 2 (two) times daily as needed.   Yes Historical Provider, MD  prochlorperazine (COMPAZINE) 10 MG tablet Take 1 tablet (10 mg total) by mouth every 6 (six) hours as needed for nausea or vomiting. 08/29/14  Yes Farrel Gobble, MD  spironolactone (ALDACTONE) 25 MG tablet Take 50 mg by mouth daily.    Yes Historical Provider, MD    Family History  Problem Relation Age of Onset  . Heart disease Mother 6  . Hypertension Mother   . Colon cancer Father   . Hypertension Sister     x2  . Diabetes Sister     x2  . Heart failure Sister   . Lupus Brother     History   Social History  . Marital Status: Divorced    Spouse Name: N/A    Number of Children: 2  . Years of Education: N/A   Occupational History  . Nursing assistant    Social History Main Topics  . Smoking status: Never Smoker   . Smokeless tobacco: Never Used  . Alcohol Use: No  . Drug Use: No  . Sexual Activity: Yes    Birth Control/ Protection: None   Other Topics Concern  . None   Social History Narrative   Three grandchildren     Review of Systems unable to be obtained fully, patient lethargic   Vital Signs: BP 171/72  Pulse 67  Temp(Src) 98.1 F (36.7 C) (Axillary)  Resp 20  Ht $R'5\' 5"'vA$  (1.651 m)  Wt 239 lb 13.8 oz (108.8 kg)  BMI 39.91 kg/m2  SpO2 100%  Physical Exam General: Lethargic, NAD Heart: RRR without M/G/R Lungs: CTA bilaterally Abd: Soft, ND, (+)BS  Imaging: Ct Abdomen Pelvis Wo Contrast  09/21/2014   CLINICAL DATA:  Evaluate for septic emboli. History of renal cell cancer, multiple myeloma.  EXAM: CT CHEST, ABDOMEN AND PELVIS WITHOUT CONTRAST  TECHNIQUE: Multidetector CT imaging of the chest, abdomen and pelvis was  performed following the standard protocol without IV contrast.  COMPARISON:  CT 05/11/2014 and 04/20/2014.  FINDINGS: CT CHEST FINDINGS  Thoracic aorta normal caliber. Atherosclerotic vascular changes noted stable cardiomegaly. Coronary artery disease.  Shotty mediastinal lymph nodes.  Thoracic esophagus is unremarkable.  Endobronchial lesion is noted in the right mainstem bronchus, most likely mucous plugging. Similar findings noted in the trachea to a lesser degree. New also severe bilateral pulmonary infiltrates most consistent with pneumonia. Pulmonary edema could present this fashion. Tiny pleural effusions. No pneumothorax.  Calcified right thyroid lobe mass again noted. Chest wall is intact.  CT ABDOMEN AND PELVIS FINDINGS  Liver normal. Spleen normal. Pancreas normal. No biliary distention. The gallbladder is nondistended.  Right nephrectomy. Right surgical bed is unremarkable. No mass lesion. Left kidney is unremarkable. No hydronephrosis or obstructing ureteral stone. Foley catheter is noted in the bladder. Air is noted bladder pelvis is most likely from instrumentation. Uterus and adnexa are unremarkable.  No significant adenopathy. Aortoiliac atherosclerotic vascular disease.  Appendiceal region is normal. There is no bowel distention. No free air. No mesenteric mass. Mild subcutaneous edema noted over the lower flanks. Developing mild anasarca could present in this fashion. Correlation to exclude cellulitis or developing hematoma suggested.  Diffuse severe thoracolumbar spine degenerative change. Sclerotic changes about the SI joints are stable.  IMPRESSION: 1. New severe bilateral pulmonary infiltrates most consistent with pneumonia. Pulmonary edema could present in this fashion. 2. Endotracheal and right endobronchial soft tissue densities, most likely mucous plugging. Possibility of a and bronchial primary or metastatic lesion cannot be excluded. 3. Right nephrectomy. Nephrectomy bed appears stable.  No recurrent mass. 4. Mild subcutaneous edema noted over the lower flanks. Developing obstructive could present in this fashion. Clinical correlation is is suggested to exclude developing cellulitis or developing hematomas in these regions.   Electronically Signed   By: Marcello Moores  Register   On: 09/20/2014 21:55   Ct Head Wo Contrast  09/18/2014   CLINICAL DATA:  Code stroke, altered level of consciousness  EXAM: CT HEAD WITHOUT CONTRAST  TECHNIQUE: Contiguous axial images were obtained from the base of the skull through the vertex without intravenous contrast.  COMPARISON:  None  FINDINGS: Cavum septum pellucidum and vergae.  Otherwise normal ventricular morphology.  No midline shift.  Old high RIGHT frontal infarct.  Large acute LEFT MCA territory infarct involving the LEFT temporal and parietal lobes with effacement of sulci.  No definite dense middle cerebral artery sign.  No intracranial hemorrhage, mass lesion or extra-axial fluid collections.  Low-attenuation at posterior aspect of LEFT cerebellar hemisphere may represent a small old infarct as well.  Minimal small vessel chronic ischemic changes of deep cerebral white matter.  No acute bone or sinus findings.  Bony excrescences are identified at the sphenoid wings bilaterally potentially developmental variants or exostoses though difficult to completely exclude calcified meningiomas, considered much less likely.  IMPRESSION: Large LEFT MCA territory infarct involving LEFT temporal and parietal lobes.  No acute intracranial hemorrhage or midline shift.  Old small high RIGHT frontal and questionable LEFT cerebellar infarcts.  Critical Value/emergent results were called by telephone at the time of interpretation on 09/18/2014 at 1556 hr to Dr. Nat Christen, who verbally acknowledged these results.   Electronically Signed   By: Lavonia Dana M.D.   On: 09/18/2014 15:58   Ct Chest Wo Contrast  09/20/2014   CLINICAL DATA:  Evaluate for septic emboli. History of  renal cell cancer, multiple myeloma.  EXAM: CT CHEST, ABDOMEN AND PELVIS WITHOUT CONTRAST  TECHNIQUE: Multidetector CT imaging of the chest, abdomen and pelvis was performed following the standard protocol without IV contrast.  COMPARISON:  CT 05/11/2014 and 04/20/2014.  FINDINGS: CT CHEST FINDINGS  Thoracic aorta normal caliber. Atherosclerotic vascular changes noted stable cardiomegaly. Coronary artery disease.  Shotty mediastinal lymph nodes.  Thoracic esophagus is unremarkable.  Endobronchial lesion is noted in the right mainstem bronchus, most likely mucous plugging. Similar findings noted in the trachea to a lesser degree. New also severe bilateral pulmonary infiltrates most consistent with pneumonia. Pulmonary edema could present this fashion. Tiny pleural effusions. No pneumothorax.  Calcified right thyroid lobe mass again noted. Chest wall is intact.  CT ABDOMEN AND PELVIS  FINDINGS  Liver normal. Spleen normal. Pancreas normal. No biliary distention. The gallbladder is nondistended.  Right nephrectomy. Right surgical bed is unremarkable. No mass lesion. Left kidney is unremarkable. No hydronephrosis or obstructing ureteral stone. Foley catheter is noted in the bladder. Air is noted bladder pelvis is most likely from instrumentation. Uterus and adnexa are unremarkable.  No significant adenopathy. Aortoiliac atherosclerotic vascular disease.  Appendiceal region is normal. There is no bowel distention. No free air. No mesenteric mass. Mild subcutaneous edema noted over the lower flanks. Developing mild anasarca could present in this fashion. Correlation to exclude cellulitis or developing hematoma suggested.  Diffuse severe thoracolumbar spine degenerative change. Sclerotic changes about the SI joints are stable.  IMPRESSION: 1. New severe bilateral pulmonary infiltrates most consistent with pneumonia. Pulmonary edema could present in this fashion. 2. Endotracheal and right endobronchial soft tissue densities,  most likely mucous plugging. Possibility of a and bronchial primary or metastatic lesion cannot be excluded. 3. Right nephrectomy. Nephrectomy bed appears stable. No recurrent mass. 4. Mild subcutaneous edema noted over the lower flanks. Developing obstructive could present in this fashion. Clinical correlation is is suggested to exclude developing cellulitis or developing hematomas in these regions.   Electronically Signed   By: Marcello Moores  Register   On: 09/20/2014 21:55   Mr Jodene Nam Head Wo Contrast  09/19/2014   ADDENDUM REPORT: 09/19/2014 10:07  ADDENDUM: Study discussed by telephone with Dr. Roderic Palau On 09/19/2014 at 0958 hrs.   Electronically Signed   By: Lars Pinks M.D.   On: 09/19/2014 10:07   09/19/2014   CLINICAL DATA:  68 year old female with left MCA infarct detected by CT presenting with code stroke, altered mental status. Initial encounter.  EXAM: MRI HEAD WITHOUT CONTRAST  MRA HEAD WITHOUT CONTRAST  TECHNIQUE: Multiplanar, multiecho pulse sequences of the brain and surrounding structures were obtained without intravenous contrast. Angiographic images of the head were obtained using MRA technique without contrast.  COMPARISON:  Head CT without contrast 09/18/2014.  FINDINGS: MRI HEAD FINDINGS  Large confluent and intensely restricted diffusion in the left hemisphere extending from the insula and operculum posteriorly, involving most of the left occipital lobe as well as the posterior temporal lobe. Small nodular areas of restricted diffusion in the left basal ganglia, which otherwise are spared. Superimposed multiple bilateral punctate and nodular foci of restricted diffusion, including involvement of the right MCA and PCA territories. Furthermore there are multiple nodular foci restricted diffusion in both cerebellar hemispheres. See series 100.  Cytotoxic edema in the affected territories. Still, intracranial mass effect is Mild at this time, with Trace rightward midline shift. No definite petechial  hemorrhage.  Major intracranial vascular flow voids are preserved.  Cavum septum pellucidum. Mass effect on the left lateral ventricle. No ventriculomegaly. Basilar cisterns remain patent. Negative pituitary, cervicomedullary junction, and grossly negative visualized cervical spine. Chronic lacunar infarcts superimposed in the right corona radiata and right superior frontal gyrus subcortical white matter (with small focus of cortical encephalomalacia).  Visualized orbit soft tissues are within normal limits. Visualized paranasal sinuses and mastoids are clear. Normal bone marrow signal. No acute scalp soft tissue findings.  MRA HEAD FINDINGS  Antegrade flow in the posterior circulation with codominant distal vertebral arteries. Patent vertebrobasilar junction. Right PICA origin is patent. Left PICA less well visualized. Basilar artery irregularity without hemodynamically significant stenosis. Both PCA origins are patent. The right posterior communicating artery is present, the left is diminutive or absent. Bilateral PCA branches are within normal limits.  Antegrade flow  in both ICA siphons. Bilateral siphon irregularity is moderate. No definite hemodynamically significant ICA siphon stenosis. Patent carotid termini. Normal right MCA and bilateral ACA origins.  The left MCA is occluded in the M1 segment 10 mm beyond its origin. Paucity of flow signal only in the left MCA anterior division M2 branches.  Anterior communicating artery diminutive or absent. Visualized bilateral ACA branches are within normal limits. Right MCA M1 segment and bifurcation are patent with mild to moderate irregularity. No definite M2 branch occlusion on the right.  IMPRESSION: 1. Large acute left MCA infarct. Cytotoxic edema and mass effect but no associated hemorrhage at this time. 2. Numerous scattered small acute infarcts in the bilateral MCA, right PCA, and bilateral PICA territories. Constellation most compatible with recent embolic  event (cardiac or proximal aortic source). 3. Left MCA occlusion in the M1 segment. Some reconstituted flow only in the anterior left MCA division. 4. No other major circle of Willis branch occlusion. Moderate irregularity of both ICA siphons.  Electronically Signed: By: Lars Pinks M.D. On: 09/19/2014 09:44   Mr Brain Wo Contrast  09/19/2014   ADDENDUM REPORT: 09/19/2014 10:07  ADDENDUM: Study discussed by telephone with Dr. Roderic Palau On 09/19/2014 at 0958 hrs.   Electronically Signed   By: Lars Pinks M.D.   On: 09/19/2014 10:07   09/19/2014   CLINICAL DATA:  68 year old female with left MCA infarct detected by CT presenting with code stroke, altered mental status. Initial encounter.  EXAM: MRI HEAD WITHOUT CONTRAST  MRA HEAD WITHOUT CONTRAST  TECHNIQUE: Multiplanar, multiecho pulse sequences of the brain and surrounding structures were obtained without intravenous contrast. Angiographic images of the head were obtained using MRA technique without contrast.  COMPARISON:  Head CT without contrast 09/18/2014.  FINDINGS: MRI HEAD FINDINGS  Large confluent and intensely restricted diffusion in the left hemisphere extending from the insula and operculum posteriorly, involving most of the left occipital lobe as well as the posterior temporal lobe. Small nodular areas of restricted diffusion in the left basal ganglia, which otherwise are spared. Superimposed multiple bilateral punctate and nodular foci of restricted diffusion, including involvement of the right MCA and PCA territories. Furthermore there are multiple nodular foci restricted diffusion in both cerebellar hemispheres. See series 100.  Cytotoxic edema in the affected territories. Still, intracranial mass effect is Mild at this time, with Trace rightward midline shift. No definite petechial hemorrhage.  Major intracranial vascular flow voids are preserved.  Cavum septum pellucidum. Mass effect on the left lateral ventricle. No ventriculomegaly. Basilar cisterns  remain patent. Negative pituitary, cervicomedullary junction, and grossly negative visualized cervical spine. Chronic lacunar infarcts superimposed in the right corona radiata and right superior frontal gyrus subcortical white matter (with small focus of cortical encephalomalacia).  Visualized orbit soft tissues are within normal limits. Visualized paranasal sinuses and mastoids are clear. Normal bone marrow signal. No acute scalp soft tissue findings.  MRA HEAD FINDINGS  Antegrade flow in the posterior circulation with codominant distal vertebral arteries. Patent vertebrobasilar junction. Right PICA origin is patent. Left PICA less well visualized. Basilar artery irregularity without hemodynamically significant stenosis. Both PCA origins are patent. The right posterior communicating artery is present, the left is diminutive or absent. Bilateral PCA branches are within normal limits.  Antegrade flow in both ICA siphons. Bilateral siphon irregularity is moderate. No definite hemodynamically significant ICA siphon stenosis. Patent carotid termini. Normal right MCA and bilateral ACA origins.  The left MCA is occluded in the M1 segment 10 mm beyond  its origin. Paucity of flow signal only in the left MCA anterior division M2 branches.  Anterior communicating artery diminutive or absent. Visualized bilateral ACA branches are within normal limits. Right MCA M1 segment and bifurcation are patent with mild to moderate irregularity. No definite M2 branch occlusion on the right.  IMPRESSION: 1. Large acute left MCA infarct. Cytotoxic edema and mass effect but no associated hemorrhage at this time. 2. Numerous scattered small acute infarcts in the bilateral MCA, right PCA, and bilateral PICA territories. Constellation most compatible with recent embolic event (cardiac or proximal aortic source). 3. Left MCA occlusion in the M1 segment. Some reconstituted flow only in the anterior left MCA division. 4. No other major circle of  Willis branch occlusion. Moderate irregularity of both ICA siphons.  Electronically Signed: By: Lars Pinks M.D. On: 09/19/2014 09:44   US Soft Tissue Head/neck  08/31/2014   CLINICAL DATA:  thyroid nodule. FNA biopsy of dominant right nodule 09/07/2013.  EXAM: THYROID ULTRASOUND  TECHNIQUE: Ultrasound examination of the thyroid gland and adjacent soft tissues was performed.  COMPARISON:  08/04/2013  FINDINGS: Right thyroid lobe  Measurements: 50 x 23 x 32 mm. Dominant 36 x 20 x 25 mm complex solid nodule, mid lobe (previously 19 x 26 x 29). Adjacent 9 x 6 mm complex cyst at its inferior margin (previously 6 x 8 x 9).  Left thyroid lobe  Measurements: 48 x 18 x 27 mm. Markedly inhomogeneous background echotexture. Poorly marginated 14 x 7 x 10 mm hypoechoic lesion, mid lobe (previously 7 x 9 x 8). 8 x 6 x 9 mm complex cyst, inferior pole.  Isthmus  Thickness: 3 mm.  No nodules visualized.  Lymphadenopathy  None visualized.  IMPRESSION: 1. Thyromegaly with bilateral nodules. Enlargement of dominant right lesion; correlate with previous biopsy results. No other lesion meets current consensus criteria for biopsy. Follow-up by clinical exam is recommended. If patient has known risk factors for thyroid carcinoma, consider follow-up ultrasound in 12 months. If patient is clinically hyperthyroid, consider nuclear medicine thyroid uptake and scan. This recommendation follows the consensus statement: Management of Thyroid Nodules Detected as [place]: Society of Radiologists in [place]. Radiology 2005; [id number].   Electronically Signed   By: Arne Cleveland M.D.   On: 08/31/2014 14:30   Dg Chest Port 1 View  09/28/2014   CLINICAL DATA:  History of previous CVA; suspect aspiration pneumonia; patient unresponsive  EXAM: PORTABLE CHEST - 1 VIEW  COMPARISON:  Portable chest x-ray of September 21, 2014  FINDINGS: The lungs are hypoinflated. There is no alveolar infiltrate. However, the left  hemidiaphragm remains obscured. The pulmonary interstitial markings are mildly increased predominantly on the left. The cardiopericardial silhouette is mildly enlarged. The feeding tube tip lies in the region of the distal esophagus.  IMPRESSION: 1. There is no definite pneumonia but lower lobe atelectasis or effusion on the left is suspected. 2. The radiodense tipped bulb of the feeding tube overlies the region of the lower esophagus. Advancement by at least 10-20 cm is recommended. 3. These results will be called to the ordering clinician or representative by the Radiologist Assistant, and communication documented in the PACS or zVision Dashboard.   Electronically Signed   By: David  Martinique   On: 09/28/2014 08:44   Dg Chest Port 1 View  09/21/2014   CLINICAL DATA:  68 year old female with shortness of breath. Left MCA infarct, aspiration pneumonia.  EXAM: PORTABLE CHEST - 1 VIEW  COMPARISON:  09/18/2014  and prior chest radiographs dating back to 05/08/2000  FINDINGS: Cardiomegaly again noted.  Elevation of the right hemidiaphragm and bibasilar atelectasis again noted.  Pulmonary vascular congestion again identified.  There has been interval improvement in left lung aeration.  There is no evidence of pneumothorax.  No other significant change identified.  IMPRESSION: Improved left lung aeration without other significant change.   Electronically Signed   By: Hassan Rowan M.D.   On: 09/21/2014 08:15   Dg Chest Portable 1 View  09/18/2014   CLINICAL DATA:  Stroke.  EXAM: PORTABLE CHEST - 1 VIEW  COMPARISON:  Chest CT 05/11/2014  FINDINGS: Single view of the chest demonstrates low lung volumes. Patchy airspace densities in the left lung. There may be subtle airspace disease in the right lung. Heart size is accentuated by the low lung volumes.  IMPRESSION: Patchy airspace disease, particularly in the left lung. Findings are concerning for pneumonia or aspiration. Asymmetric pulmonary edema is also in the differential  diagnosis.   Electronically Signed   By: Markus Daft M.D.   On: 09/18/2014 17:10   Dg Abd Portable 1v  09/24/2014   CLINICAL DATA:  Assess feeding tube position  EXAM: PORTABLE ABDOMEN - 1 VIEW  COMPARISON:  Portable chest x-ray of September 24, 2014  FINDINGS: The radiodense feeding tube projects along the inferior border of the twelfth rib in the region of the distal stomach. It is little changed since yesterday's study. The bowel gas pattern is nonspecific.  IMPRESSION: The radiodense feeding tube tip overlies the pyloric region of the stomach.   Electronically Signed   By: David  Martinique   On: 09/24/2014 13:40   Dg Abd Portable 1v  09/24/2014   CLINICAL DATA:  Panda tube placement  EXAM: PORTABLE ABDOMEN - 1 VIEW  COMPARISON:  09/23/2014  FINDINGS: Interval advancement of weighted tip enteric tube with tip now overlying the expected location of the gastric antrum.  Paucity of bowel gas without evidence of obstruction.  No supine evidence of pneumoperitoneum. No pneumatosis or portal venous gas.  Limited visualization of the adjacent thorax demonstrates minimal bibasilar opacities, left greater than right.  No acute osseus abnormalities.  IMPRESSION: Interval advancement of and weighted tip enteric tube, now overlying the expected location of the gastric antrum.   Electronically Signed   By: Sandi Mariscal M.D.   On: 09/24/2014 12:18   Dg Abd Portable 1v  09/23/2014   CLINICAL DATA:  Nasogastric tube placement.  EXAM: PORTABLE ABDOMEN - 1 VIEW  COMPARISON:  None.  FINDINGS: Feeding tube tip terminates just beyond the gastroesophageal junction. Bowel gas pattern is unremarkable. Basilar lung opacities are incidentally imaged.  IMPRESSION: Feeding tube tip terminates just beyond the gastroesophageal junction.   Electronically Signed   By: Lorin Picket M.D.   On: 09/23/2014 18:47    Labs:  CBC:  Recent Labs  09/21/14 0347 09/22/14 0348 09/24/14 0442 09/27/14 0719  WBC 11.1* 9.6 10.7* 8.3  HGB 10.9*  10.6* 10.8* 10.8*  HCT 31.1* 30.0* 30.9* 31.2*  PLT 54* 100* 238 257    COAGS:  Recent Labs  08/15/14 0800 09/18/14 1558  INR 1.12 1.13  APTT 40*  --     BMP:  Recent Labs  09/25/14 0422 09/26/14 0657 09/27/14 0857 09/28/14 0924  NA 152* 148* 148* 145  K 4.3 4.9 4.2 4.3  CL 116* 114* 113* 109  CO2 $Re'23 22 26 25  'SNi$ GLUCOSE 203* 231* 203* 223*  BUN 58* 55* 44*  29*  CALCIUM 9.4 9.2 9.0 8.4  CREATININE 1.63* 1.46* 1.33* 1.11*  GFRNONAA 31* 36* 40* 50*  GFRAA 36* 41* 46* 58*    LIVER FUNCTION TESTS:  Recent Labs  09/25/14 0422 09/26/14 0657 09/27/14 0857 09/28/14 0924  BILITOT 0.5 0.3 0.3 0.3  AST $Re'13 20 15 22  'VRL$ ALT 29 32 32 35  ALKPHOS 49 60 61 67  PROT 6.2 6.0 5.5* 5.2*  ALBUMIN 2.5* 2.5* 2.3* 2.1*    TUMOR MARKERS: No results found for this basename: AFPTM, CEA, CA199, CHROMGRNA,  in the last 8760 hours  Assessment and Plan: CVA, left MCA infarct Dysphagia Aspiration pneumonia s/p antibiotic course, afebrile.   Request for image guided percutaneous gastrostomy tube placement with moderate sedation Patient will be NPO after midnight, hold tube feeds, no thinners, afebrile, wbc wnl Consent to still be obtained by family.  HTN RCC s/p right nephrectomy  Multiple myeloma on chemotherapy  Hypernatremia, resolving.  HTN CKD Diastolic CHF Pulmonary fibrosis    Thank you for this interesting consult.  I greatly enjoyed meeting Penrose and look forward to participating in their care.   I spent a total of 40 minutes face to face in clinical consultation, greater than 50% of which was counseling/coordinating care  Signed: Hedy Jacob 09/28/2014, 1:52 PM

## 2014-09-28 NOTE — Progress Notes (Signed)
NUTRITION FOLLOW UP  Intervention:   When PEG is ready for use, re-Initiate Glucerna 1.2 @ 20 ml/hr via PEG and increase by 10 ml every 4 hours to goal rate of 55 ml/hr.   30 ml Prostat once daily.    Tube feeding regimen will provide 1684 kcal (100% of needs), 94 grams of protein, and 1069 ml of H2O.   Nutrition Dx:   Inadequate oral intake related to inability to eat, s/p CVA as evidenced by NPO status; ongoing  Goal:   Pt to meet >/= 90% of their estimated nutrition needs; currently unmet  Monitor:   TF regimen & tolerance, weight, labs, I/O's  Assessment:   68 y.o. female with PMH significant for HTN, hyperlipidemia, renal cell carcinoma s/p right nephrectomy, multiple myeloma on chemotherapy, transferred [facility] for further stroke management.  MRI showed large acute L MCA stroke and numerous scattered small acute infarcts in the bilateral MCA, right PCA, and bilateral PICA territories with right hemiparesis. 10/2: RD consulted for TF initiation & management.  Per chart TF to be held at midnight for PEG placement tomorrow. Pt asleep at time of visit, TF turned off. Per nursing notes, pt was receiving Glucerna 1.2 at goal rate of 65 ml/hr since 10/4 until at least 2 AM this morning. Residuals have been 0 ml. Pt's weight has trended up 14 lbs in the past 9 days.  Labs: elevated glucose, low albumin, decreased GFR  Height: Ht Readings from Last 1 Encounters:  09/19/14 _0  (1.651 m)    Weight Status:   Wt Readings from Last 1 Encounters:  09/28/14 251 lb 5.2 oz (114 kg)    Re-estimated needs:  Kcal: 1600-1750 Protein: 85 to 100 grams Fluid: 1.6-1.8 L/day  Skin: +1 RUE, RLE, and LLE edema  Diet Order:   NPO  No intake or output data in the 24 hours ending 09/28/14 1654  Last BM: 10/6   Labs:   Recent Labs Lab 09/26/14 0657 09/27/14 0857 09/28/14 0924  NA 148* 148* 145  K 4.9 4.2 4.3  CL 114* 113* 109  CO2 _1 BUN 55* 44* 29*  CREATININE 1.46* 1.33*  1.11*  CALCIUM 9.2 9.0 8.4  GLUCOSE 231* 203* 223*    CBG (last 3)   Recent Labs  09/28/14 0402 09/28/14 1152 09/28/14 1631  GLUCAP 217* 177* 161*    Scheduled Meds: . antiseptic oral rinse  7 mL Mouth Rinse q12n4p  . aspirin  300 mg Rectal Daily   Or  . aspirin  325 mg Oral Daily  . carvedilol  6.25 mg Oral BID WC  . [START ON 09/29/2014]  ceFAZolin (ANCEF) IV  2 g Intravenous On Call  . chlorhexidine  15 mL Mouth Rinse BID  . cloNIDine  0.3 mg Transdermal Weekly  . enoxaparin (LOVENOX) injection  40 mg Subcutaneous Q24H  . fluticasone  2 spray Each Nare Daily  . free water  100 mL Per Tube 6 times per day  . insulin aspart  0-9 Units Subcutaneous 6 times per day  . ipratropium-albuterol  3 mL Nebulization BID  . levothyroxine  50 mcg Intravenous Daily  . pantoprazole (PROTONIX) IV  40 mg Intravenous Q12H    Continuous Infusions: . dextrose 75 mL/hr at 09/28/14 1652  . feeding supplement (GLUCERNA 1.2 CAL) 1,000 mL (09/28/14 0248)    Pryor Ochoa RD, LDN Inpatient Clinical Dietitian Pager: [phone] After Hours Pager: [phone]

## 2014-09-28 NOTE — Progress Notes (Signed)
SLP Cancellation Note  Patient Details Name: Melissa [name] MRN: [id number] DOB: [birth year]   Cancelled treatment:       Reason Eval/Treat Not Completed: Fatigue/lethargy limiting ability to participate;Patient's level of consciousness   Ovila Lepage, Katherene Ponto 09/28/2014, 10:55 AM

## 2014-09-29 ENCOUNTER — Inpatient Hospital Stay (HOSPITAL_COMMUNITY): Payer: Medicare Other

## 2014-09-29 DIAGNOSIS — R1310 Dysphagia, unspecified: Secondary | ICD-10-CM

## 2014-09-29 DIAGNOSIS — N183 Chronic kidney disease, stage 3 (moderate): Secondary | ICD-10-CM

## 2014-09-29 LAB — URINALYSIS, ROUTINE W REFLEX MICROSCOPIC
Bilirubin Urine: NEGATIVE
GLUCOSE, UA: NEGATIVE mg/dL
HGB URINE DIPSTICK: NEGATIVE
KETONES UR: NEGATIVE mg/dL
Leukocytes, UA: NEGATIVE
Nitrite: NEGATIVE
Protein, ur: NEGATIVE mg/dL
Specific Gravity, Urine: 1.013 (ref 1.005–1.030)
Urobilinogen, UA: 0.2 mg/dL (ref 0.0–1.0)
pH: 6 (ref 5.0–8.0)

## 2014-09-29 LAB — COMPREHENSIVE METABOLIC PANEL
ALT: 32 U/L (ref 0–35)
ANION GAP: 9 (ref 5–15)
AST: 20 U/L (ref 0–37)
Albumin: 2.2 g/dL — ABNORMAL LOW (ref 3.5–5.2)
Alkaline Phosphatase: 46 U/L (ref 39–117)
BUN: 17 mg/dL (ref 6–23)
CALCIUM: 8.7 mg/dL (ref 8.4–10.5)
CO2: 24 meq/L (ref 19–32)
CREATININE: 1.07 mg/dL (ref 0.50–1.10)
Chloride: 108 mEq/L (ref 96–112)
GFR, EST AFRICAN AMERICAN: 60 mL/min — AB (ref >90)
GFR, EST NON AFRICAN AMERICAN: 52 mL/min — AB (ref >90)
GLUCOSE: 166 mg/dL — AB (ref 70–99)
Potassium: 4.4 mEq/L (ref 3.7–5.3)
SODIUM: 141 meq/L (ref 137–147)
Total Bilirubin: 0.4 mg/dL (ref 0.3–1.2)
Total Protein: 5.2 g/dL — ABNORMAL LOW (ref 6.0–8.3)

## 2014-09-29 LAB — GLUCOSE, CAPILLARY
GLUCOSE-CAPILLARY: 164 mg/dL — AB (ref 70–99)
GLUCOSE-CAPILLARY: 160 mg/dL — AB (ref 70–99)
Glucose-Capillary: 117 mg/dL — ABNORMAL HIGH (ref 70–99)
Glucose-Capillary: 130 mg/dL — ABNORMAL HIGH (ref 70–99)
Glucose-Capillary: 156 mg/dL — ABNORMAL HIGH (ref 70–99)
Glucose-Capillary: 168 mg/dL — ABNORMAL HIGH (ref 70–99)

## 2014-09-29 MED ORDER — LIDOCAINE HCL 1 % IJ SOLN
INTRAMUSCULAR | Status: AC
  Filled 2014-09-29: qty 20

## 2014-09-29 MED ORDER — FENTANYL CITRATE 0.05 MG/ML IJ SOLN
INTRAMUSCULAR | Status: AC
  Filled 2014-09-29: qty 4

## 2014-09-29 MED ORDER — GLUCAGON HCL RDNA (DIAGNOSTIC) 1 MG IJ SOLR
INTRAMUSCULAR | Status: AC
  Filled 2014-09-29: qty 1

## 2014-09-29 MED ORDER — FENTANYL CITRATE 0.05 MG/ML IJ SOLN
INTRAMUSCULAR | Status: AC
  Filled 2014-09-29: qty 2

## 2014-09-29 MED ORDER — MIDAZOLAM HCL 2 MG/2ML IJ SOLN
INTRAMUSCULAR | Status: AC
  Filled 2014-09-29: qty 4

## 2014-09-29 MED ORDER — HYDRALAZINE HCL 20 MG/ML IJ SOLN
10.0000 mg | Freq: Three times a day (TID) | INTRAMUSCULAR | Status: DC
  Administered 2014-09-29 – 2014-09-30 (×2): 10 mg via INTRAVENOUS
  Filled 2014-09-29 (×2): qty 1

## 2014-09-29 MED ORDER — FENTANYL CITRATE 0.05 MG/ML IJ SOLN
INTRAMUSCULAR | Status: DC
Start: 2014-09-29 — End: 2014-09-29
  Filled 2014-09-29: qty 2

## 2014-09-29 MED ORDER — IOHEXOL 300 MG/ML  SOLN
50.0000 mL | Freq: Once | INTRAMUSCULAR | Status: AC | PRN
  Administered 2014-09-29: 25 mL via INTRAVENOUS

## 2014-09-29 NOTE — Clinical Social Work Note (Signed)
The pt will be transiting to [facility] when medically stable.   [place], MSW, [place]

## 2014-09-29 NOTE — Sedation Documentation (Signed)
Procedure started, not using sedation at this time.

## 2014-09-29 NOTE — Progress Notes (Signed)
Patient ID: Melissa [name]  female  QMV:[id number]    DOB: 1946-06-20    DOA: 09/18/2014  PCP: [name], MD  Admit HPI / Brief Narrative:  68 y.o F Hx HTN, hyperlipidemia, renal cell carcinoma s/p right nephrectomy, pulmonary fibrosis, multiple myeloma on chemotherapy (Revlimid 25 mg days 1-14 every 21 days / Velcade D1, 4, 8, and 11 every 21 days / dexamethasone) last seen by Dr. Alla German (Oncology) on 9/21, who was transferred to [facility] for further stroke management. Patient has altered mental status and is aphasic.  She was initially evaluated at [facility] where notes indicated that she was brought to the hospital by EMS due to altered mental status. The patient was found by her daughter at approximately 2 PM. She had fallen on the ground at an unknown time and was found on the floor by her daughter. She was altered at that point and had weakness of her right side including right facial droop and was not verbalizing. She was last known to be well the previous night. Code stroke was called and Neurology determined not to proceed with TPA due to unknown time of onset.  MRI/MRA brain showed large acute left MCA infarct. Cytotoxic edema and mass effect but no associated hemorrhage at this time. Numerous scattered small acute infarcts in the bilateral MCA, right PCA, and bilateral PICA territories. Constellation most compatible with recent embolic event (cardiac or proximal aortic source).  Further workup including a TEE showed a PFO. patient has a NG tube in place through which she is receiving tube feeds. Patient's family requested a PEG tube placement, IR consulted. Patient developed hypernatremia despite getting half normal saline   Assessment/Plan: Principal Problem: Large Acute L MCA stroke and numerous scattered small acute infarcts in the bilateral MCA, right PCA, and bilateral PICA territories with right hemiparesis - Findings consistent with cardiac/proximal aortic emboli vs  coagulopathy from chemotherapy; no findings on TTE - Patient underwent TEE which did not show cardiac source of emboli, no intracardiac thrombus, there was a PFO  - LE venous Dopplers negative for DVT.  - Patient was on aspirin 81 mg daily prior to admission, now on aspirin 300mg  suppository - Continue tube feeds, IR consulted for PEG tube placement, awaiting PEG tube placement today - She also needs a loop recorder by EP before discharge, this has been placed on hold because of patient's multiple comorbidities  Acute encephalopathy/aphasia - Per daughter at the bedside, patient has been lethargic over the last 3 days. UA negative for UTI. CT head stat done today showed no acute CVA, large subacute LEFT MCA territory infarct involving the LEFT temporal and parietal lobes and LEFT insula. Mild mass effect in LEFT hemisphere with 4 mm of LEFT-to-RIGHT midline shift. - Otherwise vital signs are stable except elevated BP, no fevers or leukocytosis - Recent ABG on 10/6 did not show any CO2 narcosis - If no improvement by tomorrow, will check EEG. Not on any narcotics or pain medications.     Aspiration pneumonia - Discontinued clindamycin , completed Unasyn on 10/5 after a total of 8 days of treatment for aspiration pneumonia - Chest x-ray 10/7 showed no definitive pneumonia  Dysphagia: Residual dysphagia from CVA -Awaiting PEG tube placement  Diastolic CHF  -Due to permissive hypertension, restarted Coreg  Progressive renal failure, hypernatremia improving  Mildly Elevated Troponin  -Likely due to massive stroke. No EKG changes. Have since normalized . 2-D echo showed EF of 60-65%, no regional wall motion abnormalities.  Thrombocythemia  -Likley due to chemotherapy, improving l  Multiple myeloma  -Patient just ended one cycle of treatment and is on a planned 7 day rest - doubt her candidacy to return to active tx, unless dramatic improvement in clinical status occurs   Hypertension    -Hold antihypertensives to maintain cranial perfusion - BP improved today   Hypothyroid  Continue IV Synthroid    DVT Prophylaxis: Lovenox  Code Status: Full code  Family Communication: Discussed in detail with patient's daughter at the bedside   Disposition: Awaiting PEG tube placement  Consultants:  Neurology  Cardiology for TEE  Procedures: 9/27 CT head without contrast Large LEFT MCA territory infarct involving LEFT temporal and  parietal lobes.  -Old small high RIGHT frontal & questionable LEFT cerebellar infarcts.  9/27 PCXR Patchy airspace disease,>left lung. C/W pneumonia vs aspiration vs Asymmetric pulmonary edema  9/28 MRI/MRA head without contrast Large acute left MCA infarct. Cytotoxic edema and mass effect but no associated hemorrhage at this time.  -Numerous scattered small acute infarcts in the bilateral MCA/right PCA/bilateral PICA territories. most  C/W embolic event (cardiac or proximal aortic source).  -Left MCA occlusion in the M1 segment.  9/28 echocardiogram with contrast Left ventricle: mild LVH. -LVEF= 60% to 65%. -(grade 1 diastolic dysfunction). - Right ventricle: mildly dilated. - Right atrium: mildly dilated.  - Pulmonary arteries: PA peak pressure: 52 mm Hg (S).      Antibiotics:  Clindamycin 9/27>    Subjective: Patient seen and examined, still somnolent and lethargic not following any commands    Objective: Weight change:   Intake/Output Summary (Last 24 hours) at 09/29/14 1308 Last data filed at 09/29/14 0900  Gross per 24 hour  Intake      0 ml  Output      0 ml  Net      0 ml   Blood pressure 196/72, pulse 69, temperature 97.7 F (36.5 C), temperature source Oral, resp. rate 20, height 5\' 5"  (1.651 m), weight 114 kg (251 lb 5.2 oz), SpO2 100.00%.  Physical Exam: General: somnolent, lethargic, not following any commands  CVS: S1-S2 clear, no murmur rubs or gallops Chest:  decreased breath sound at the bases  Abdomen:  Obese soft nontender, nondistended, normal bowel sounds  Extremities: no cyanosis, clubbing or edema noted bilaterally Neuro not cooperating with exam  Lab Results: Basic Metabolic Panel:  Recent Labs Lab 09/28/14 0924 09/29/14 0815  NA 145 141  K 4.3 4.4  CL 109 108  CO2 25 24  GLUCOSE 223* 166*  BUN 29* 17  CREATININE 1.11* 1.07  CALCIUM 8.4 8.7   Liver Function Tests:  Recent Labs Lab 09/28/14 0924 09/29/14 0815  AST 22 20  ALT 35 32  ALKPHOS 67 46  BILITOT 0.3 0.4  PROT 5.2* 5.2*  ALBUMIN 2.1* 2.2*   No results found for this basename: LIPASE, AMYLASE,  in the last 168 hours No results found for this basename: AMMONIA,  in the last 168 hours CBC:  Recent Labs Lab 09/24/14 0442 09/27/14 0719  WBC 10.7* 8.3  HGB 10.8* 10.8*  HCT 30.9* 31.2*  MCV 87.8 89.1  PLT 238 257   Cardiac Enzymes: No results found for this basename: CKTOTAL, CKMB, CKMBINDEX, TROPONINI,  in the last 168 hours BNP: No components found with this basename: POCBNP,  CBG:  Recent Labs Lab 09/28/14 2006 09/29/14 0015 09/29/14 0408 09/29/14 0843 09/29/14 1202  GLUCAP 132* 168* 156* 164* 130*     Micro  Results: Recent Results (from the past 240 hour(s))  CULTURE, RESPIRATORY (NON-EXPECTORATED)     Status: None   Collection Time    09/20/14  8:42 PM      Result Value Ref Range Status   Specimen Description TRACHEAL ASPIRATE   Final   Special Requests Normal   Final   Gram Stain     Final   Value: FEW WBC PRESENT,BOTH PMN AND MONONUCLEAR     FEW SQUAMOUS EPITHELIAL CELLS PRESENT     MODERATE GRAM NEGATIVE RODS     FEW GRAM POSITIVE COCCI     IN PAIRS   Culture     Final   Value: Non-Pathogenic Oropharyngeal-type Flora Isolated.     Performed at [facility]   Report Status 09/23/2014 FINAL   Final  CULTURE, BLOOD (ROUTINE X 2)     Status: None   Collection Time    09/20/14  9:55 PM      Result Value Ref Range Status   Specimen Description BLOOD RIGHT ARM    Final   Special Requests     Final   Value: BOTTLES DRAWN AEROBIC AND ANAEROBIC 10CC AEROBIC 5CC ANAEROBIC   Culture  Setup Time     Final   Value: 09/21/2014 03:28     Performed at [facility]   Culture     Final   Value: NO GROWTH 5 DAYS     Performed at [facility]   Report Status 09/27/2014 FINAL   Final  CULTURE, BLOOD (ROUTINE X 2)     Status: None   Collection Time    09/20/14 10:05 PM      Result Value Ref Range Status   Specimen Description BLOOD RIGHT HAND   Final   Special Requests BOTTLES DRAWN AEROBIC ONLY 10CC   Final   Culture  Setup Time     Final   Value: 09/21/2014 03:29     Performed at [facility]   Culture     Final   Value: NO GROWTH 5 DAYS     Performed at [facility]   Report Status 09/27/2014 FINAL   Final    Studies/Results: Ct Abdomen Pelvis Wo Contrast  09/21/2014   CLINICAL DATA:  Evaluate for septic emboli. History of renal cell cancer, multiple myeloma.  EXAM: CT CHEST, ABDOMEN AND PELVIS WITHOUT CONTRAST  TECHNIQUE: Multidetector CT imaging of the chest, abdomen and pelvis was performed following the standard protocol without IV contrast.  COMPARISON:  CT 05/11/2014 and 04/20/2014.  FINDINGS: CT CHEST FINDINGS  Thoracic aorta normal caliber. Atherosclerotic vascular changes noted stable cardiomegaly. Coronary artery disease.  Shotty mediastinal lymph nodes.  Thoracic esophagus is unremarkable.  Endobronchial lesion is noted in the right mainstem bronchus, most likely mucous plugging. Similar findings noted in the trachea to a lesser degree. New also severe bilateral pulmonary infiltrates most consistent with pneumonia. Pulmonary edema could present this fashion. Tiny pleural effusions. No pneumothorax.  Calcified right thyroid lobe mass again noted. Chest wall is intact.  CT ABDOMEN AND PELVIS FINDINGS  Liver normal. Spleen normal. Pancreas normal. No biliary distention. The gallbladder is nondistended.  Right nephrectomy.  Right surgical bed is unremarkable. No mass lesion. Left kidney is unremarkable. No hydronephrosis or obstructing ureteral stone. Foley catheter is noted in the bladder. Air is noted bladder pelvis is most likely from instrumentation. Uterus and adnexa are unremarkable.  No significant adenopathy. Aortoiliac atherosclerotic vascular disease.  Appendiceal region is normal. There  is no bowel distention. No free air. No mesenteric mass. Mild subcutaneous edema noted over the lower flanks. Developing mild anasarca could present in this fashion. Correlation to exclude cellulitis or developing hematoma suggested.  Diffuse severe thoracolumbar spine degenerative change. Sclerotic changes about the SI joints are stable.  IMPRESSION: 1. New severe bilateral pulmonary infiltrates most consistent with pneumonia. Pulmonary edema could present in this fashion. 2. Endotracheal and right endobronchial soft tissue densities, most likely mucous plugging. Possibility of a and bronchial primary or metastatic lesion cannot be excluded. 3. Right nephrectomy. Nephrectomy bed appears stable. No recurrent mass. 4. Mild subcutaneous edema noted over the lower flanks. Developing obstructive could present in this fashion. Clinical correlation is is suggested to exclude developing cellulitis or developing hematomas in these regions.   Electronically Signed   By: Maisie Fus  Register   On: 09/20/2014 21:55   Ct Head Wo Contrast  09/18/2014   CLINICAL DATA:  Code stroke, altered level of consciousness  EXAM: CT HEAD WITHOUT CONTRAST  TECHNIQUE: Contiguous axial images were obtained from the base of the skull through the vertex without intravenous contrast.  COMPARISON:  None  FINDINGS: Cavum septum pellucidum and vergae.  Otherwise normal ventricular morphology.  No midline shift.  Old high RIGHT frontal infarct.  Large acute LEFT MCA territory infarct involving the LEFT temporal and parietal lobes with effacement of sulci.  No definite dense  middle cerebral artery sign.  No intracranial hemorrhage, mass lesion or extra-axial fluid collections.  Low-attenuation at posterior aspect of LEFT cerebellar hemisphere may represent a small old infarct as well.  Minimal small vessel chronic ischemic changes of deep cerebral white matter.  No acute bone or sinus findings.  Bony excrescences are identified at the sphenoid wings bilaterally potentially developmental variants or exostoses though difficult to completely exclude calcified meningiomas, considered much less likely.  IMPRESSION: Large LEFT MCA territory infarct involving LEFT temporal and parietal lobes.  No acute intracranial hemorrhage or midline shift.  Old small high RIGHT frontal and questionable LEFT cerebellar infarcts.  Critical Value/emergent results were called by telephone at the time of interpretation on 09/18/2014 at 1556 hr to Dr. Donnetta Hutching, who verbally acknowledged these results.   Electronically Signed   By: Ulyses Southward M.D.   On: 09/18/2014 15:58   Ct Chest Wo Contrast  09/20/2014   CLINICAL DATA:  Evaluate for septic emboli. History of renal cell cancer, multiple myeloma.  EXAM: CT CHEST, ABDOMEN AND PELVIS WITHOUT CONTRAST  TECHNIQUE: Multidetector CT imaging of the chest, abdomen and pelvis was performed following the standard protocol without IV contrast.  COMPARISON:  CT 05/11/2014 and 04/20/2014.  FINDINGS: CT CHEST FINDINGS  Thoracic aorta normal caliber. Atherosclerotic vascular changes noted stable cardiomegaly. Coronary artery disease.  Shotty mediastinal lymph nodes.  Thoracic esophagus is unremarkable.  Endobronchial lesion is noted in the right mainstem bronchus, most likely mucous plugging. Similar findings noted in the trachea to a lesser degree. New also severe bilateral pulmonary infiltrates most consistent with pneumonia. Pulmonary edema could present this fashion. Tiny pleural effusions. No pneumothorax.  Calcified right thyroid lobe mass again noted. Chest wall is  intact.  CT ABDOMEN AND PELVIS FINDINGS  Liver normal. Spleen normal. Pancreas normal. No biliary distention. The gallbladder is nondistended.  Right nephrectomy. Right surgical bed is unremarkable. No mass lesion. Left kidney is unremarkable. No hydronephrosis or obstructing ureteral stone. Foley catheter is noted in the bladder. Air is noted bladder pelvis is most likely from instrumentation. Uterus and adnexa  are unremarkable.  No significant adenopathy. Aortoiliac atherosclerotic vascular disease.  Appendiceal region is normal. There is no bowel distention. No free air. No mesenteric mass. Mild subcutaneous edema noted over the lower flanks. Developing mild anasarca could present in this fashion. Correlation to exclude cellulitis or developing hematoma suggested.  Diffuse severe thoracolumbar spine degenerative change. Sclerotic changes about the SI joints are stable.  IMPRESSION: 1. New severe bilateral pulmonary infiltrates most consistent with pneumonia. Pulmonary edema could present in this fashion. 2. Endotracheal and right endobronchial soft tissue densities, most likely mucous plugging. Possibility of a and bronchial primary or metastatic lesion cannot be excluded. 3. Right nephrectomy. Nephrectomy bed appears stable. No recurrent mass. 4. Mild subcutaneous edema noted over the lower flanks. Developing obstructive could present in this fashion. Clinical correlation is is suggested to exclude developing cellulitis or developing hematomas in these regions.   Electronically Signed   By: Maisie Fus  Register   On: 09/20/2014 21:55   Mr Maxine Glenn Head Wo Contrast  09/19/2014   ADDENDUM REPORT: 09/19/2014 10:07  ADDENDUM: Study discussed by telephone with Dr. Kerry Hough On 09/19/2014 at 0958 hrs.   Electronically Signed   By: Augusto Gamble M.D.   On: 09/19/2014 10:07   09/19/2014   CLINICAL DATA:  68 year old female with left MCA infarct detected by CT presenting with code stroke, altered mental status. Initial encounter.   EXAM: MRI HEAD WITHOUT CONTRAST  MRA HEAD WITHOUT CONTRAST  TECHNIQUE: Multiplanar, multiecho pulse sequences of the brain and surrounding structures were obtained without intravenous contrast. Angiographic images of the head were obtained using MRA technique without contrast.  COMPARISON:  Head CT without contrast 09/18/2014.  FINDINGS: MRI HEAD FINDINGS  Large confluent and intensely restricted diffusion in the left hemisphere extending from the insula and operculum posteriorly, involving most of the left occipital lobe as well as the posterior temporal lobe. Small nodular areas of restricted diffusion in the left basal ganglia, which otherwise are spared. Superimposed multiple bilateral punctate and nodular foci of restricted diffusion, including involvement of the right MCA and PCA territories. Furthermore there are multiple nodular foci restricted diffusion in both cerebellar hemispheres. See series 100.  Cytotoxic edema in the affected territories. Still, intracranial mass effect is Mild at this time, with Trace rightward midline shift. No definite petechial hemorrhage.  Major intracranial vascular flow voids are preserved.  Cavum septum pellucidum. Mass effect on the left lateral ventricle. No ventriculomegaly. Basilar cisterns remain patent. Negative pituitary, cervicomedullary junction, and grossly negative visualized cervical spine. Chronic lacunar infarcts superimposed in the right corona radiata and right superior frontal gyrus subcortical white matter (with small focus of cortical encephalomalacia).  Visualized orbit soft tissues are within normal limits. Visualized paranasal sinuses and mastoids are clear. Normal bone marrow signal. No acute scalp soft tissue findings.  MRA HEAD FINDINGS  Antegrade flow in the posterior circulation with codominant distal vertebral arteries. Patent vertebrobasilar junction. Right PICA origin is patent. Left PICA less well visualized. Basilar artery irregularity without  hemodynamically significant stenosis. Both PCA origins are patent. The right posterior communicating artery is present, the left is diminutive or absent. Bilateral PCA branches are within normal limits.  Antegrade flow in both ICA siphons. Bilateral siphon irregularity is moderate. No definite hemodynamically significant ICA siphon stenosis. Patent carotid termini. Normal right MCA and bilateral ACA origins.  The left MCA is occluded in the M1 segment 10 mm beyond its origin. Paucity of flow signal only in the left MCA anterior division M2 branches.  Anterior  communicating artery diminutive or absent. Visualized bilateral ACA branches are within normal limits. Right MCA M1 segment and bifurcation are patent with mild to moderate irregularity. No definite M2 branch occlusion on the right.  IMPRESSION: 1. Large acute left MCA infarct. Cytotoxic edema and mass effect but no associated hemorrhage at this time. 2. Numerous scattered small acute infarcts in the bilateral MCA, right PCA, and bilateral PICA territories. Constellation most compatible with recent embolic event (cardiac or proximal aortic source). 3. Left MCA occlusion in the M1 segment. Some reconstituted flow only in the anterior left MCA division. 4. No other major circle of Willis branch occlusion. Moderate irregularity of both ICA siphons.  Electronically Signed: By: Augusto Gamble M.D. On: 09/19/2014 09:44   Mr Brain Wo Contrast  09/19/2014   ADDENDUM REPORT: 09/19/2014 10:07  ADDENDUM: Study discussed by telephone with Dr. Kerry Hough On 09/19/2014 at 0958 hrs.   Electronically Signed   By: Augusto Gamble M.D.   On: 09/19/2014 10:07   09/19/2014   CLINICAL DATA:  68 year old female with left MCA infarct detected by CT presenting with code stroke, altered mental status. Initial encounter.  EXAM: MRI HEAD WITHOUT CONTRAST  MRA HEAD WITHOUT CONTRAST  TECHNIQUE: Multiplanar, multiecho pulse sequences of the brain and surrounding structures were obtained without  intravenous contrast. Angiographic images of the head were obtained using MRA technique without contrast.  COMPARISON:  Head CT without contrast 09/18/2014.  FINDINGS: MRI HEAD FINDINGS  Large confluent and intensely restricted diffusion in the left hemisphere extending from the insula and operculum posteriorly, involving most of the left occipital lobe as well as the posterior temporal lobe. Small nodular areas of restricted diffusion in the left basal ganglia, which otherwise are spared. Superimposed multiple bilateral punctate and nodular foci of restricted diffusion, including involvement of the right MCA and PCA territories. Furthermore there are multiple nodular foci restricted diffusion in both cerebellar hemispheres. See series 100.  Cytotoxic edema in the affected territories. Still, intracranial mass effect is Mild at this time, with Trace rightward midline shift. No definite petechial hemorrhage.  Major intracranial vascular flow voids are preserved.  Cavum septum pellucidum. Mass effect on the left lateral ventricle. No ventriculomegaly. Basilar cisterns remain patent. Negative pituitary, cervicomedullary junction, and grossly negative visualized cervical spine. Chronic lacunar infarcts superimposed in the right corona radiata and right superior frontal gyrus subcortical white matter (with small focus of cortical encephalomalacia).  Visualized orbit soft tissues are within normal limits. Visualized paranasal sinuses and mastoids are clear. Normal bone marrow signal. No acute scalp soft tissue findings.  MRA HEAD FINDINGS  Antegrade flow in the posterior circulation with codominant distal vertebral arteries. Patent vertebrobasilar junction. Right PICA origin is patent. Left PICA less well visualized. Basilar artery irregularity without hemodynamically significant stenosis. Both PCA origins are patent. The right posterior communicating artery is present, the left is diminutive or absent. Bilateral PCA  branches are within normal limits.  Antegrade flow in both ICA siphons. Bilateral siphon irregularity is moderate. No definite hemodynamically significant ICA siphon stenosis. Patent carotid termini. Normal right MCA and bilateral ACA origins.  The left MCA is occluded in the M1 segment 10 mm beyond its origin. Paucity of flow signal only in the left MCA anterior division M2 branches.  Anterior communicating artery diminutive or absent. Visualized bilateral ACA branches are within normal limits. Right MCA M1 segment and bifurcation are patent with mild to moderate irregularity. No definite M2 branch occlusion on the right.  IMPRESSION: 1. Large acute  left MCA infarct. Cytotoxic edema and mass effect but no associated hemorrhage at this time. 2. Numerous scattered small acute infarcts in the bilateral MCA, right PCA, and bilateral PICA territories. Constellation most compatible with recent embolic event (cardiac or proximal aortic source). 3. Left MCA occlusion in the M1 segment. Some reconstituted flow only in the anterior left MCA division. 4. No other major circle of Willis branch occlusion. Moderate irregularity of both ICA siphons.  Electronically Signed: By: Augusto Gamble M.D. On: 09/19/2014 09:44   US Soft Tissue Head/neck  08/31/2014   CLINICAL DATA:  thyroid nodule. FNA biopsy of dominant right nodule 09/07/2013.  EXAM: THYROID ULTRASOUND  TECHNIQUE: Ultrasound examination of the thyroid gland and adjacent soft tissues was performed.  COMPARISON:  08/04/2013  FINDINGS: Right thyroid lobe  Measurements: 50 x 23 x 32 mm. Dominant 36 x 20 x 25 mm complex solid nodule, mid lobe (previously 19 x 26 x 29). Adjacent 9 x 6 mm complex cyst at its inferior margin (previously 6 x 8 x 9).  Left thyroid lobe  Measurements: 48 x 18 x 27 mm. Markedly inhomogeneous background echotexture. Poorly marginated 14 x 7 x 10 mm hypoechoic lesion, mid lobe (previously 7 x 9 x 8). 8 x 6 x 9 mm complex cyst, inferior pole.  Isthmus   Thickness: 3 mm.  No nodules visualized.  Lymphadenopathy  None visualized.  IMPRESSION: 1. Thyromegaly with bilateral nodules. Enlargement of dominant right lesion; correlate with previous biopsy results. No other lesion meets current consensus criteria for biopsy. Follow-up by clinical exam is recommended. If patient has known risk factors for thyroid carcinoma, consider follow-up ultrasound in 12 months. If patient is clinically hyperthyroid, consider nuclear medicine thyroid uptake and scan. This recommendation follows the consensus statement: Management of Thyroid Nodules Detected as [place]: Society of Radiologists in Ultrasound Consensus Conference Statement. Radiology 2005; [id number].   Electronically Signed   By: Oley Balm M.D.   On: 08/31/2014 14:30   Dg Chest Port 1 View  09/21/2014   CLINICAL DATA:  68 year old female with shortness of breath. Left MCA infarct, aspiration pneumonia.  EXAM: PORTABLE CHEST - 1 VIEW  COMPARISON:  09/18/2014 and prior chest radiographs dating back to 05/08/2000  FINDINGS: Cardiomegaly again noted.  Elevation of the right hemidiaphragm and bibasilar atelectasis again noted.  Pulmonary vascular congestion again identified.  There has been interval improvement in left lung aeration.  There is no evidence of pneumothorax.  No other significant change identified.  IMPRESSION: Improved left lung aeration without other significant change.   Electronically Signed   By: Laveda Abbe M.D.   On: 09/21/2014 08:15   Dg Chest Portable 1 View  09/18/2014   CLINICAL DATA:  Stroke.  EXAM: PORTABLE CHEST - 1 VIEW  COMPARISON:  Chest CT 05/11/2014  FINDINGS: Single view of the chest demonstrates low lung volumes. Patchy airspace densities in the left lung. There may be subtle airspace disease in the right lung. Heart size is accentuated by the low lung volumes.  IMPRESSION: Patchy airspace disease, particularly in the left lung. Findings are concerning for pneumonia or aspiration.  Asymmetric pulmonary edema is also in the differential diagnosis.   Electronically Signed   By: Richarda Overlie M.D.   On: 09/18/2014 17:10    Medications: Scheduled Meds: . antiseptic oral rinse  7 mL Mouth Rinse q12n4p  . aspirin  300 mg Rectal Daily   Or  . aspirin  325 mg Oral Daily  . carvedilol  6.25 mg Oral BID WC  .  ceFAZolin (ANCEF) IV  2 g Intravenous On Call  . chlorhexidine  15 mL Mouth Rinse BID  . cloNIDine  0.3 mg Transdermal Weekly  . enoxaparin (LOVENOX) injection  40 mg Subcutaneous Q24H  . [START ON 09/30/2014] feeding supplement (PRO-STAT SUGAR FREE 64)  30 mL Oral Q24H  . fentaNYL      . fluticasone  2 spray Each Nare Daily  . free water  100 mL Per Tube 6 times per day  . hydrALAZINE  10 mg Intravenous Q8H  . insulin aspart  0-9 Units Subcutaneous 6 times per day  . ipratropium-albuterol  3 mL Nebulization BID  . levothyroxine  50 mcg Intravenous Daily  . midazolam      . pantoprazole (PROTONIX) IV  40 mg Intravenous Q12H      LOS: 11 days   RAI,RIPUDEEP M.D. [facility] 09/29/2014, 1:08 PM Pager: [phone]  If 7PM-7AM, please contact night-coverage [email or web address] Password [email or web address]

## 2014-09-29 NOTE — Procedures (Signed)
Successful fluoro 11fr Gtube Ready for use No comp Stable Full report in PACS

## 2014-09-29 NOTE — H&P (Signed)
Agree 

## 2014-09-30 DIAGNOSIS — F329 Major depressive disorder, single episode, unspecified: Secondary | ICD-10-CM

## 2014-09-30 DIAGNOSIS — I6310 Cerebral infarction due to embolism of unspecified precerebral artery: Secondary | ICD-10-CM

## 2014-09-30 LAB — GLUCOSE, CAPILLARY
GLUCOSE-CAPILLARY: 108 mg/dL — AB (ref 70–99)
GLUCOSE-CAPILLARY: 112 mg/dL — AB (ref 70–99)
GLUCOSE-CAPILLARY: 118 mg/dL — AB (ref 70–99)
GLUCOSE-CAPILLARY: 119 mg/dL — AB (ref 70–99)
GLUCOSE-CAPILLARY: 122 mg/dL — AB (ref 70–99)
GLUCOSE-CAPILLARY: 125 mg/dL — AB (ref 70–99)

## 2014-09-30 LAB — COMPREHENSIVE METABOLIC PANEL
ALBUMIN: 2.2 g/dL — AB (ref 3.5–5.2)
ALK PHOS: 44 U/L (ref 39–117)
ALT: 27 U/L (ref 0–35)
AST: 19 U/L (ref 0–37)
Anion gap: 9 (ref 5–15)
BUN: 15 mg/dL (ref 6–23)
CALCIUM: 8.7 mg/dL (ref 8.4–10.5)
CO2: 24 mEq/L (ref 19–32)
Chloride: 108 mEq/L (ref 96–112)
Creatinine, Ser: 1.24 mg/dL — ABNORMAL HIGH (ref 0.50–1.10)
GFR calc Af Amer: 51 mL/min — ABNORMAL LOW (ref >90)
GFR calc non Af Amer: 44 mL/min — ABNORMAL LOW (ref >90)
GLUCOSE: 111 mg/dL — AB (ref 70–99)
POTASSIUM: 4.1 meq/L (ref 3.7–5.3)
Sodium: 141 mEq/L (ref 137–147)
TOTAL PROTEIN: 5.3 g/dL — AB (ref 6.0–8.3)
Total Bilirubin: 0.4 mg/dL (ref 0.3–1.2)

## 2014-09-30 LAB — URINE CULTURE
COLONY COUNT: NO GROWTH
CULTURE: NO GROWTH

## 2014-09-30 MED ORDER — HYDRALAZINE HCL 25 MG PO TABS
25.0000 mg | ORAL_TABLET | Freq: Three times a day (TID) | ORAL | Status: DC
  Administered 2014-09-30 – 2014-10-01 (×4): 25 mg
  Filled 2014-09-30 (×4): qty 1

## 2014-09-30 MED ORDER — AMLODIPINE BESYLATE 10 MG PO TABS
10.0000 mg | ORAL_TABLET | Freq: Every morning | ORAL | Status: DC
  Administered 2014-09-30 – 2014-10-01 (×2): 10 mg
  Filled 2014-09-30 (×2): qty 1

## 2014-09-30 MED ORDER — PRAVASTATIN SODIUM 40 MG PO TABS
40.0000 mg | ORAL_TABLET | Freq: Every day | ORAL | Status: DC
  Administered 2014-09-30: 40 mg
  Filled 2014-09-30 (×2): qty 1

## 2014-09-30 NOTE — Progress Notes (Signed)
Orthopedic Tech Progress Note Patient Details:  Melissa [name] Feb 09, 1946 [id number]  Ortho Devices Type of Ortho Device: Abdominal binder Ortho Device/Splint Interventions: Criss Alvine 09/30/2014, 7:37 PM

## 2014-09-30 NOTE — Progress Notes (Signed)
Physical Therapy Treatment Patient Details Name: Scotland Exley Carlini MRN: [id number] DOB: Jan 08, 1946 Today's Date: 09/30/2014    History of Present Illness Pt is a 68 y.o female presenting s/p L MCA ischemic CVA with R sided weakness and R facial droop. TPA was not administered. 09/28/14 became more somnolent with repeat Head CT showing 4mm shift left to right (had been trace shift on prior MRI). Hx of  intermittent vertigo, hypothyroidism, hyperlipidemia, obesity, HTN, R nephrectomy am renal cell carcinoma. Pt receiving chemotherapy treatment for multiple myeloma.    PT Comments    Pt initially with no response to sternal rub, loud noises, or cold cloth (while pt supine). Utilized bed to assist pt to sit on EOB with +2 person total assist. Pt opening eyes for up to 30 seconds, however no other responses elicited by PT or her daughter. No righting responses and pt not even able to hold her head up in sitting. See below re: discussion with daughter regarding pt's current inability to participate with PT. Will continue trial of PT x 1 week at freq of 2x/week.    Follow Up Recommendations  SNF;Supervision/Assistance - 24 hour (PT at SNF if she becomes more responsive)     Equipment Recommendations  None recommended by PT    Recommendations for Other Services       Precautions / Restrictions Precautions Precautions: Fall Precaution Comments: PEG    Mobility  Bed Mobility Overal bed mobility: Needs Assistance Bed Mobility: Rolling;Supine to Sit;Sit to Supine Rolling: +2 for physical assistance;Total assist (to her Lt)   Supine to sit: Total assist;+2 for physical assistance;HOB elevated Sit to supine: Total assist;+2 for physical assistance   General bed mobility comments: Pt did assist with rolling to her Rt once motion initiated (brought LLE over to roll); otherwise pt total assist  Transfers                 General transfer comment: unable to attempt  Ambulation/Gait                 Stairs            Wheelchair Mobility    Modified Rankin (Stroke Patients Only)       Balance Overall balance assessment: Needs assistance Sitting-balance support: No upper extremity supported;Feet supported Sitting balance-Leahy Scale: Zero Sitting balance - Comments: pt brought to EOB to incr arousal with pt opening eyes and maintaining for up to 30 seconds. Pt unable to hold her head upright and required total assist to support her in sitting. Daughter present and tried to engage pt with only eye opening elicited (no tracking, no facial changes). Sat EOB a total of 10 minutes attempting multi-modal stimulation and simple commands with pt not responding other than eye opening. Postural control: Posterior lean;Right lateral lean                          Cognition Arousal/Alertness:  (Obtunded)   Overall Cognitive Status: Difficult to assess                      Exercises      General Comments General comments (skin integrity, edema, etc.): Discussed patient's current status and inability to participate with PT at this time. Pt was observed to spontaneously move her Lt extremities on return to supine. Daughter's understanding is she has been less responsive as brain is resting for healing. Feel continued PT for 1 week (at  freq of 2x/wk) to assess if she becomes more responsive.       Pertinent Vitals/Pain Pain Assessment: Faces Faces Pain Scale: No hurt    Home Living                      Prior Function            PT Goals (current goals can now be found in the care plan section) Acute Rehab PT Goals Patient Stated Goal: unable to state Progress towards PT goals: Not progressing toward goals - comment    Frequency  Min 2X/week    PT Plan Current plan remains appropriate    Co-evaluation             End of Session   Activity Tolerance: Treatment limited secondary to medical complications (Comment);Patient  limited by lethargy Patient left: in bed;with call bell/phone within reach;with family/visitor present     Time: 1137-1202 PT Time Calculation (min): 25 min  Charges:  $Neuromuscular Re-education: 23-37 mins                    G Codes:      Xaria Judon 10-13-14, 1:29 PM Pager [phone]

## 2014-09-30 NOTE — Progress Notes (Signed)
Occupational Therapy Treatment Patient Details Name: Melissa [name] MRN: [id number] DOB: 05/30/1946 Today's Date: 09/30/2014    History of present illness Pt is a 68 y.o female presenting s/p L MCA ischemic CVA with R sided weakness and R facial droop. TPA was not administered. 09/28/14 became more somnolent with repeat Head CT showing 4mm shift left to right (had been trace shift on prior MRI). Hx of  intermittent vertigo, hypothyroidism, hyperlipidemia, obesity, HTN, R nephrectomy am renal cell carcinoma. Pt receiving chemotherapy treatment for multiple myeloma.   OT comments  Pt with incr arousal this session compared to previous sessions and demonstrates pushers syndrome with L UE. Pt attempting oral care however closing mouth and refusing after several hand over hand attempts max (A). Ot to continue to follow acutely with frequency reduced to 1x week.    Follow Up Recommendations  SNF;Supervision/Assistance - 24 hour    Equipment Recommendations  Wheelchair (measurements OT);Wheelchair cushion (measurements OT);Other (comment);Hospital bed (hoyer)    Recommendations for Other Services      Precautions / Restrictions Precautions Precautions: Fall Precaution Comments: PEG       Mobility Bed Mobility Overal bed mobility: Needs Assistance;+2 for physical assistance Bed Mobility: Rolling;Sit to Supine Rolling: +2 for physical assistance;Max assist   Supine to sit: Total assist;+2 for physical assistance;HOB elevated Sit to supine: +2 for physical assistance;Max assist   General bed mobility comments: Pt initiations by pushing with LLE to return supine. pt grabbing and holding rail with L UE. Pt with automatic response to close hand on any object placed in palm. Pt requires total +2 total to log roll L side  Transfers                 General transfer comment: bed level only    Balance Overall balance assessment: Needs assistance Sitting-balance support: Single  extremity supported;Feet supported Sitting balance-Leahy Scale: Zero Sitting balance - Comments: pushing with LUE and needs (A) to reposition. Pt with (A) to initiate neutral head position Postural control: Posterior lean;Right lateral lean                         ADL Overall ADL's : Needs assistance/impaired Eating/Feeding: Total assistance;Sitting Eating/Feeding Details (indicate cue type and reason): hand over hand . Pt with spoon approaching and opening mouth appropriately.  Grooming: Wash/dry face;Maximal assistance;Sitting Grooming Details (indicate cue type and reason): hand over hand with minimal initiation but did attempt brief to wipe mouth. pt with attention level a focused limiting                                General ADL Comments: Pt incontinent at EOB sitting. pt noted ot have large red area on R forearm positioned on pillows at end of session. Pt with new linens and new gown. Pt with peg disconnecting briefly with small amount of back flow and reconnect correctly. Pt with HOB elevated > 30 degrees at end of session      Vision   Alignment/Gaze Preference: Head turned;Gaze left                 Perception     Praxis      Cognition   Behavior During Therapy: Restless Overall Cognitive Status: Difficult to assess     Current Attention Level: Focused    Following Commands:  (not following commands)   Awareness:  (not at intellectual  yet)   General Comments: Pt with eyes open with strong L gaze entire session. pt with brief moments at midline less than 3 seconds. pt demonstartes L UE pushers syndrome    Extremity/Trunk Assessment               Exercises     Shoulder Instructions       General Comments      Pertinent Vitals/ Pain       Pain Assessment: No/denies pain Faces Pain Scale: No hurt  Home Living                                          Prior Functioning/Environment               Frequency Min 1X/week     Progress Toward Goals  OT Goals(current goals can now be found in the care plan section)  Progress towards OT goals: Progressing toward goals  Acute Rehab OT Goals Patient Stated Goal: unable to state OT Goal Formulation: With family Time For Goal Achievement: 10/06/14 Potential to Achieve Goals: Good ADL Goals Pt Will Perform Grooming: with mod assist;sitting Pt Will Perform Upper Body Bathing: with mod assist;sitting Pt Will Perform Lower Body Bathing: with mod assist;bed level Pt Will Transfer to Toilet: with +2 assist;with mod assist;bedside commode Additional ADL Goal #1: comlpete 2 step task during ADL activity with min facilitation of hand over hand to begin activity.  Plan Discharge plan remains appropriate;Frequency needs to be updated    Co-evaluation    PT/OT/SLP Co-Evaluation/Treatment: Yes Reason for Co-Treatment: Complexity of the patient's impairments (multi-system involvement)   OT goals addressed during session: ADL's and self-care      End of Session     Activity Tolerance Patient tolerated treatment well   Patient Left in bed;with call bell/phone within reach;with family/visitor present;with bed alarm set   Nurse Communication Mobility status;Precautions        Time: 1456-1530 OT Time Calculation (min): 34 min  Charges: OT General Charges $OT Visit: 1 Procedure OT Treatments $Self Care/Home Management : 23-37 mins  Harolyn Rutherford 09/30/2014, 4:18 PM Pager: [phone]

## 2014-09-30 NOTE — Progress Notes (Signed)
Patient ID: Melissa [name]  female  YQM:[id number]    DOB: 1946-08-02    DOA: 09/18/2014  PCP: [name], MD  Admit HPI / Brief Narrative:  68 y.o F Hx HTN, hyperlipidemia, renal cell carcinoma s/p right nephrectomy, pulmonary fibrosis, multiple myeloma on chemotherapy (Revlimid 25 mg days 1-14 every 21 days / Velcade D1, 4, 8, and 11 every 21 days / dexamethasone) last seen by Dr. Alla German (Oncology) on 9/21, who was transferred to [facility] for further stroke management. Patient has altered mental status and is aphasic.  She was initially evaluated at [facility] where notes indicated that she was brought to the hospital by EMS due to altered mental status. The patient was found by her daughter at approximately 2 PM. She had fallen on the ground at an unknown time and was found on the floor by her daughter. She was altered at that point and had weakness of her right side including right facial droop and was not verbalizing. She was last known to be well the previous night. Code stroke was called and Neurology determined not to proceed with TPA due to unknown time of onset.  MRI/MRA brain showed large acute left MCA infarct. Cytotoxic edema and mass effect but no associated hemorrhage at this time. Numerous scattered small acute infarcts in the bilateral MCA, right PCA, and bilateral PICA territories. Constellation most compatible with recent embolic event (cardiac or proximal aortic source).  Further workup including a TEE showed a PFO. patient has a NG tube in place through which she is receiving tube feeds. Patient's family requested a PEG tube placement, IR consulted, PEG tube placed on 10/8.    Assessment/Plan: Principal Problem: Large Acute L MCA stroke and numerous scattered small acute infarcts in the bilateral MCA, right PCA, and bilateral PICA territories with right hemiparesis - Findings consistent with cardiac/proximal aortic emboli vs coagulopathy from chemotherapy; no findings on  TTE - Patient underwent TEE which did not show cardiac source of emboli, no intracardiac thrombus, there was a PFO  - LE venous Dopplers negative for DVT.  - Patient was on aspirin 81 mg daily prior to admission, now on aspirin 300mg  suppository - PEG tube placed on 10/8, start tube feeds today, nutrition consult placed    Acute encephalopathy/aphasia - Per daughter at the bedside, patient has been lethargic but intermittently waking up and moving in the bed however she's not working with physical therapy.  UA negative for UTI. CT head stat done today showed no acute CVA, large subacute LEFT MCA territory infarct involving the LEFT temporal and parietal lobes and LEFT insula. Mild mass effect in LEFT hemisphere with 4 mm of LEFT-to-RIGHT midline shift. - Otherwise vital signs are stable except elevated BP, no fevers or leukocytosis - Recent ABG on 10/6 did not show any CO2 narcosis    Aspiration pneumonia - Discontinued clindamycin, completed Unasyn on 10/5 after a total of 8 days of treatment for aspiration pneumonia - Chest x-ray 10/7 showed no definitive pneumonia  Dysphagia: Residual dysphagia from CVA -Awaiting PEG tube placement  Diastolic CHF  -Due to permissive hypertension, restarted Coreg, hydralazine, amlodipine  Progressive renal failure, hypernatremia improving  Mildly Elevated Troponin  -Likely due to massive stroke. No EKG changes. Have since normalized . 2-D echo showed EF of 60-65%, no regional wall motion abnormalities.  Thrombocythemia  -Likley due to chemotherapy, improving  Multiple myeloma  -Patient just ended one cycle of treatment and is on a planned 7 day rest -  doubt her candidacy to return to active tx, unless dramatic improvement in clinical status occurs   Hypertension  -Restarted amlodipine via tube,  Hypothyroid  Continue IV Synthroid    DVT Prophylaxis: Lovenox  Code Status: Full code  Family Communication: Discussed in detail with patient's  daughter at the bedside   Disposition: PEG tube placed, hopefully DC tomorrow  Consultants:  Neurology  Cardiology for TEE  Procedures: 9/27 CT head without contrast Large LEFT MCA territory infarct involving LEFT temporal and  parietal lobes.  -Old small high RIGHT frontal & questionable LEFT cerebellar infarcts.  9/27 PCXR Patchy airspace disease,>left lung. C/W pneumonia vs aspiration vs Asymmetric pulmonary edema  9/28 MRI/MRA head without contrast Large acute left MCA infarct. Cytotoxic edema and mass effect but no associated hemorrhage at this time.  -Numerous scattered small acute infarcts in the bilateral MCA/right PCA/bilateral PICA territories. most  C/W embolic event (cardiac or proximal aortic source).  -Left MCA occlusion in the M1 segment.  9/28 echocardiogram with contrast Left ventricle: mild LVH. -LVEF= 60% to 65%. -(grade 1 diastolic dysfunction). - Right ventricle: mildly dilated. - Right atrium: mildly dilated.  - Pulmonary arteries: PA peak pressure: 52 mm Hg (S).      Antibiotics:  Clindamycin 9/27>    Subjective: Patient seen and examined, still somnolent and lethargic not following any commands  however per daughter she is waking up intermittently  Objective: Weight change: -6 kg (-13 lb 3.6 oz)  Intake/Output Summary (Last 24 hours) at 09/30/14 1117 Last data filed at 09/29/14 1700  Gross per 24 hour  Intake      0 ml  Output      0 ml  Net      0 ml   Blood pressure 166/64, pulse 72, temperature 98.3 F (36.8 C), temperature source Oral, resp. rate 18, height 5\' 5"  (1.651 m), weight 108 kg (238 lb 1.6 oz), SpO2 100.00%.  Physical Exam: General: somnolent, lethargic, not following any commands  CVS: S1-S2 clear Chest:  decreased breath sound at the bases  Abdomen: Obese soft NT, ND Extremities: no cyanosis, clubbing or edema noted bilaterally Neuro not cooperating with exam  Lab Results: Basic Metabolic Panel:  Recent Labs Lab  09/29/14 0815 09/30/14 0704  NA 141 141  K 4.4 4.1  CL 108 108  CO2 24 24  GLUCOSE 166* 111*  BUN 17 15  CREATININE 1.07 1.24*  CALCIUM 8.7 8.7   Liver Function Tests:  Recent Labs Lab 09/29/14 0815 09/30/14 0704  AST 20 19  ALT 32 27  ALKPHOS 46 44  BILITOT 0.4 0.4  PROT 5.2* 5.3*  ALBUMIN 2.2* 2.2*   No results found for this basename: LIPASE, AMYLASE,  in the last 168 hours No results found for this basename: AMMONIA,  in the last 168 hours CBC:  Recent Labs Lab 09/24/14 0442 09/27/14 0719  WBC 10.7* 8.3  HGB 10.8* 10.8*  HCT 30.9* 31.2*  MCV 87.8 89.1  PLT 238 257   Cardiac Enzymes: No results found for this basename: CKTOTAL, CKMB, CKMBINDEX, TROPONINI,  in the last 168 hours BNP: No components found with this basename: POCBNP,  CBG:  Recent Labs Lab 09/29/14 1613 09/29/14 2142 09/29/14 2356 09/30/14 0421 09/30/14 0757  GLUCAP 160* 117* 125* 112* 119*     Micro Results: Recent Results (from the past 240 hour(s))  CULTURE, RESPIRATORY (NON-EXPECTORATED)     Status: None   Collection Time    09/20/14  8:42 PM  Result Value Ref Range Status   Specimen Description TRACHEAL ASPIRATE   Final   Special Requests Normal   Final   Gram Stain     Final   Value: FEW WBC PRESENT,BOTH PMN AND MONONUCLEAR     FEW SQUAMOUS EPITHELIAL CELLS PRESENT     MODERATE GRAM NEGATIVE RODS     FEW GRAM POSITIVE COCCI     IN PAIRS   Culture     Final   Value: Non-Pathogenic Oropharyngeal-type Flora Isolated.     Performed at [facility]   Report Status 09/23/2014 FINAL   Final  CULTURE, BLOOD (ROUTINE X 2)     Status: None   Collection Time    09/20/14  9:55 PM      Result Value Ref Range Status   Specimen Description BLOOD RIGHT ARM   Final   Special Requests     Final   Value: BOTTLES DRAWN AEROBIC AND ANAEROBIC 10CC AEROBIC 5CC ANAEROBIC   Culture  Setup Time     Final   Value: 09/21/2014 03:28     Performed at [facility]    Culture     Final   Value: NO GROWTH 5 DAYS     Performed at [facility]   Report Status 09/27/2014 FINAL   Final  CULTURE, BLOOD (ROUTINE X 2)     Status: None   Collection Time    09/20/14 10:05 PM      Result Value Ref Range Status   Specimen Description BLOOD RIGHT HAND   Final   Special Requests BOTTLES DRAWN AEROBIC ONLY 10CC   Final   Culture  Setup Time     Final   Value: 09/21/2014 03:29     Performed at [facility]   Culture     Final   Value: NO GROWTH 5 DAYS     Performed at [facility]   Report Status 09/27/2014 FINAL   Final    Studies/Results: Ct Abdomen Pelvis Wo Contrast  09/21/2014   CLINICAL DATA:  Evaluate for septic emboli. History of renal cell cancer, multiple myeloma.  EXAM: CT CHEST, ABDOMEN AND PELVIS WITHOUT CONTRAST  TECHNIQUE: Multidetector CT imaging of the chest, abdomen and pelvis was performed following the standard protocol without IV contrast.  COMPARISON:  CT 05/11/2014 and 04/20/2014.  FINDINGS: CT CHEST FINDINGS  Thoracic aorta normal caliber. Atherosclerotic vascular changes noted stable cardiomegaly. Coronary artery disease.  Shotty mediastinal lymph nodes.  Thoracic esophagus is unremarkable.  Endobronchial lesion is noted in the right mainstem bronchus, most likely mucous plugging. Similar findings noted in the trachea to a lesser degree. New also severe bilateral pulmonary infiltrates most consistent with pneumonia. Pulmonary edema could present this fashion. Tiny pleural effusions. No pneumothorax.  Calcified right thyroid lobe mass again noted. Chest wall is intact.  CT ABDOMEN AND PELVIS FINDINGS  Liver normal. Spleen normal. Pancreas normal. No biliary distention. The gallbladder is nondistended.  Right nephrectomy. Right surgical bed is unremarkable. No mass lesion. Left kidney is unremarkable. No hydronephrosis or obstructing ureteral stone. Foley catheter is noted in the bladder. Air is noted bladder pelvis is most  likely from instrumentation. Uterus and adnexa are unremarkable.  No significant adenopathy. Aortoiliac atherosclerotic vascular disease.  Appendiceal region is normal. There is no bowel distention. No free air. No mesenteric mass. Mild subcutaneous edema noted over the lower flanks. Developing mild anasarca could present in this fashion. Correlation to exclude cellulitis or developing hematoma suggested.  Diffuse severe thoracolumbar spine degenerative change. Sclerotic changes about the SI joints are stable.  IMPRESSION: 1. New severe bilateral pulmonary infiltrates most consistent with pneumonia. Pulmonary edema could present in this fashion. 2. Endotracheal and right endobronchial soft tissue densities, most likely mucous plugging. Possibility of a and bronchial primary or metastatic lesion cannot be excluded. 3. Right nephrectomy. Nephrectomy bed appears stable. No recurrent mass. 4. Mild subcutaneous edema noted over the lower flanks. Developing obstructive could present in this fashion. Clinical correlation is is suggested to exclude developing cellulitis or developing hematomas in these regions.   Electronically Signed   By: Maisie Fus  Register   On: 09/20/2014 21:55   Ct Head Wo Contrast  09/18/2014   CLINICAL DATA:  Code stroke, altered level of consciousness  EXAM: CT HEAD WITHOUT CONTRAST  TECHNIQUE: Contiguous axial images were obtained from the base of the skull through the vertex without intravenous contrast.  COMPARISON:  None  FINDINGS: Cavum septum pellucidum and vergae.  Otherwise normal ventricular morphology.  No midline shift.  Old high RIGHT frontal infarct.  Large acute LEFT MCA territory infarct involving the LEFT temporal and parietal lobes with effacement of sulci.  No definite dense middle cerebral artery sign.  No intracranial hemorrhage, mass lesion or extra-axial fluid collections.  Low-attenuation at posterior aspect of LEFT cerebellar hemisphere may represent a small old infarct as  well.  Minimal small vessel chronic ischemic changes of deep cerebral white matter.  No acute bone or sinus findings.  Bony excrescences are identified at the sphenoid wings bilaterally potentially developmental variants or exostoses though difficult to completely exclude calcified meningiomas, considered much less likely.  IMPRESSION: Large LEFT MCA territory infarct involving LEFT temporal and parietal lobes.  No acute intracranial hemorrhage or midline shift.  Old small high RIGHT frontal and questionable LEFT cerebellar infarcts.  Critical Value/emergent results were called by telephone at the time of interpretation on 09/18/2014 at 1556 hr to Dr. Donnetta Hutching, who verbally acknowledged these results.   Electronically Signed   By: Ulyses Southward M.D.   On: 09/18/2014 15:58   Ct Chest Wo Contrast  09/20/2014   CLINICAL DATA:  Evaluate for septic emboli. History of renal cell cancer, multiple myeloma.  EXAM: CT CHEST, ABDOMEN AND PELVIS WITHOUT CONTRAST  TECHNIQUE: Multidetector CT imaging of the chest, abdomen and pelvis was performed following the standard protocol without IV contrast.  COMPARISON:  CT 05/11/2014 and 04/20/2014.  FINDINGS: CT CHEST FINDINGS  Thoracic aorta normal caliber. Atherosclerotic vascular changes noted stable cardiomegaly. Coronary artery disease.  Shotty mediastinal lymph nodes.  Thoracic esophagus is unremarkable.  Endobronchial lesion is noted in the right mainstem bronchus, most likely mucous plugging. Similar findings noted in the trachea to a lesser degree. New also severe bilateral pulmonary infiltrates most consistent with pneumonia. Pulmonary edema could present this fashion. Tiny pleural effusions. No pneumothorax.  Calcified right thyroid lobe mass again noted. Chest wall is intact.  CT ABDOMEN AND PELVIS FINDINGS  Liver normal. Spleen normal. Pancreas normal. No biliary distention. The gallbladder is nondistended.  Right nephrectomy. Right surgical bed is unremarkable. No mass  lesion. Left kidney is unremarkable. No hydronephrosis or obstructing ureteral stone. Foley catheter is noted in the bladder. Air is noted bladder pelvis is most likely from instrumentation. Uterus and adnexa are unremarkable.  No significant adenopathy. Aortoiliac atherosclerotic vascular disease.  Appendiceal region is normal. There is no bowel distention. No free air. No mesenteric mass. Mild subcutaneous edema noted over the lower flanks. Developing  mild anasarca could present in this fashion. Correlation to exclude cellulitis or developing hematoma suggested.  Diffuse severe thoracolumbar spine degenerative change. Sclerotic changes about the SI joints are stable.  IMPRESSION: 1. New severe bilateral pulmonary infiltrates most consistent with pneumonia. Pulmonary edema could present in this fashion. 2. Endotracheal and right endobronchial soft tissue densities, most likely mucous plugging. Possibility of a and bronchial primary or metastatic lesion cannot be excluded. 3. Right nephrectomy. Nephrectomy bed appears stable. No recurrent mass. 4. Mild subcutaneous edema noted over the lower flanks. Developing obstructive could present in this fashion. Clinical correlation is is suggested to exclude developing cellulitis or developing hematomas in these regions.   Electronically Signed   By: Maisie Fus  Register   On: 09/20/2014 21:55   Mr Maxine Glenn Head Wo Contrast  09/19/2014   ADDENDUM REPORT: 09/19/2014 10:07  ADDENDUM: Study discussed by telephone with Dr. Kerry Hough On 09/19/2014 at 0958 hrs.   Electronically Signed   By: Augusto Gamble M.D.   On: 09/19/2014 10:07   09/19/2014   CLINICAL DATA:  68 year old female with left MCA infarct detected by CT presenting with code stroke, altered mental status. Initial encounter.  EXAM: MRI HEAD WITHOUT CONTRAST  MRA HEAD WITHOUT CONTRAST  TECHNIQUE: Multiplanar, multiecho pulse sequences of the brain and surrounding structures were obtained without intravenous contrast. Angiographic  images of the head were obtained using MRA technique without contrast.  COMPARISON:  Head CT without contrast 09/18/2014.  FINDINGS: MRI HEAD FINDINGS  Large confluent and intensely restricted diffusion in the left hemisphere extending from the insula and operculum posteriorly, involving most of the left occipital lobe as well as the posterior temporal lobe. Small nodular areas of restricted diffusion in the left basal ganglia, which otherwise are spared. Superimposed multiple bilateral punctate and nodular foci of restricted diffusion, including involvement of the right MCA and PCA territories. Furthermore there are multiple nodular foci restricted diffusion in both cerebellar hemispheres. See series 100.  Cytotoxic edema in the affected territories. Still, intracranial mass effect is Mild at this time, with Trace rightward midline shift. No definite petechial hemorrhage.  Major intracranial vascular flow voids are preserved.  Cavum septum pellucidum. Mass effect on the left lateral ventricle. No ventriculomegaly. Basilar cisterns remain patent. Negative pituitary, cervicomedullary junction, and grossly negative visualized cervical spine. Chronic lacunar infarcts superimposed in the right corona radiata and right superior frontal gyrus subcortical white matter (with small focus of cortical encephalomalacia).  Visualized orbit soft tissues are within normal limits. Visualized paranasal sinuses and mastoids are clear. Normal bone marrow signal. No acute scalp soft tissue findings.  MRA HEAD FINDINGS  Antegrade flow in the posterior circulation with codominant distal vertebral arteries. Patent vertebrobasilar junction. Right PICA origin is patent. Left PICA less well visualized. Basilar artery irregularity without hemodynamically significant stenosis. Both PCA origins are patent. The right posterior communicating artery is present, the left is diminutive or absent. Bilateral PCA branches are within normal limits.   Antegrade flow in both ICA siphons. Bilateral siphon irregularity is moderate. No definite hemodynamically significant ICA siphon stenosis. Patent carotid termini. Normal right MCA and bilateral ACA origins.  The left MCA is occluded in the M1 segment 10 mm beyond its origin. Paucity of flow signal only in the left MCA anterior division M2 branches.  Anterior communicating artery diminutive or absent. Visualized bilateral ACA branches are within normal limits. Right MCA M1 segment and bifurcation are patent with mild to moderate irregularity. No definite M2 branch occlusion on the right.  IMPRESSION: 1. Large acute left MCA infarct. Cytotoxic edema and mass effect but no associated hemorrhage at this time. 2. Numerous scattered small acute infarcts in the bilateral MCA, right PCA, and bilateral PICA territories. Constellation most compatible with recent embolic event (cardiac or proximal aortic source). 3. Left MCA occlusion in the M1 segment. Some reconstituted flow only in the anterior left MCA division. 4. No other major circle of Willis branch occlusion. Moderate irregularity of both ICA siphons.  Electronically Signed: By: Augusto Gamble M.D. On: 09/19/2014 09:44   Mr Brain Wo Contrast  09/19/2014   ADDENDUM REPORT: 09/19/2014 10:07  ADDENDUM: Study discussed by telephone with Dr. Kerry Hough On 09/19/2014 at 0958 hrs.   Electronically Signed   By: Augusto Gamble M.D.   On: 09/19/2014 10:07   09/19/2014   CLINICAL DATA:  68 year old female with left MCA infarct detected by CT presenting with code stroke, altered mental status. Initial encounter.  EXAM: MRI HEAD WITHOUT CONTRAST  MRA HEAD WITHOUT CONTRAST  TECHNIQUE: Multiplanar, multiecho pulse sequences of the brain and surrounding structures were obtained without intravenous contrast. Angiographic images of the head were obtained using MRA technique without contrast.  COMPARISON:  Head CT without contrast 09/18/2014.  FINDINGS: MRI HEAD FINDINGS  Large confluent and  intensely restricted diffusion in the left hemisphere extending from the insula and operculum posteriorly, involving most of the left occipital lobe as well as the posterior temporal lobe. Small nodular areas of restricted diffusion in the left basal ganglia, which otherwise are spared. Superimposed multiple bilateral punctate and nodular foci of restricted diffusion, including involvement of the right MCA and PCA territories. Furthermore there are multiple nodular foci restricted diffusion in both cerebellar hemispheres. See series 100.  Cytotoxic edema in the affected territories. Still, intracranial mass effect is Mild at this time, with Trace rightward midline shift. No definite petechial hemorrhage.  Major intracranial vascular flow voids are preserved.  Cavum septum pellucidum. Mass effect on the left lateral ventricle. No ventriculomegaly. Basilar cisterns remain patent. Negative pituitary, cervicomedullary junction, and grossly negative visualized cervical spine. Chronic lacunar infarcts superimposed in the right corona radiata and right superior frontal gyrus subcortical white matter (with small focus of cortical encephalomalacia).  Visualized orbit soft tissues are within normal limits. Visualized paranasal sinuses and mastoids are clear. Normal bone marrow signal. No acute scalp soft tissue findings.  MRA HEAD FINDINGS  Antegrade flow in the posterior circulation with codominant distal vertebral arteries. Patent vertebrobasilar junction. Right PICA origin is patent. Left PICA less well visualized. Basilar artery irregularity without hemodynamically significant stenosis. Both PCA origins are patent. The right posterior communicating artery is present, the left is diminutive or absent. Bilateral PCA branches are within normal limits.  Antegrade flow in both ICA siphons. Bilateral siphon irregularity is moderate. No definite hemodynamically significant ICA siphon stenosis. Patent carotid termini. Normal  right MCA and bilateral ACA origins.  The left MCA is occluded in the M1 segment 10 mm beyond its origin. Paucity of flow signal only in the left MCA anterior division M2 branches.  Anterior communicating artery diminutive or absent. Visualized bilateral ACA branches are within normal limits. Right MCA M1 segment and bifurcation are patent with mild to moderate irregularity. No definite M2 branch occlusion on the right.  IMPRESSION: 1. Large acute left MCA infarct. Cytotoxic edema and mass effect but no associated hemorrhage at this time. 2. Numerous scattered small acute infarcts in the bilateral MCA, right PCA, and bilateral PICA territories. Constellation most compatible with  recent embolic event (cardiac or proximal aortic source). 3. Left MCA occlusion in the M1 segment. Some reconstituted flow only in the anterior left MCA division. 4. No other major circle of Willis branch occlusion. Moderate irregularity of both ICA siphons.  Electronically Signed: By: Augusto Gamble M.D. On: 09/19/2014 09:44   US Soft Tissue Head/neck  08/31/2014   CLINICAL DATA:  thyroid nodule. FNA biopsy of dominant right nodule 09/07/2013.  EXAM: THYROID ULTRASOUND  TECHNIQUE: Ultrasound examination of the thyroid gland and adjacent soft tissues was performed.  COMPARISON:  08/04/2013  FINDINGS: Right thyroid lobe  Measurements: 50 x 23 x 32 mm. Dominant 36 x 20 x 25 mm complex solid nodule, mid lobe (previously 19 x 26 x 29). Adjacent 9 x 6 mm complex cyst at its inferior margin (previously 6 x 8 x 9).  Left thyroid lobe  Measurements: 48 x 18 x 27 mm. Markedly inhomogeneous background echotexture. Poorly marginated 14 x 7 x 10 mm hypoechoic lesion, mid lobe (previously 7 x 9 x 8). 8 x 6 x 9 mm complex cyst, inferior pole.  Isthmus  Thickness: 3 mm.  No nodules visualized.  Lymphadenopathy  None visualized.  IMPRESSION: 1. Thyromegaly with bilateral nodules. Enlargement of dominant right lesion; correlate with previous biopsy results. No  other lesion meets current consensus criteria for biopsy. Follow-up by clinical exam is recommended. If patient has known risk factors for thyroid carcinoma, consider follow-up ultrasound in 12 months. If patient is clinically hyperthyroid, consider nuclear medicine thyroid uptake and scan. This recommendation follows the consensus statement: Management of Thyroid Nodules Detected as [place]: Society of Radiologists in Ultrasound Consensus Conference Statement. Radiology 2005; [id number].   Electronically Signed   By: Oley Balm M.D.   On: 08/31/2014 14:30   Dg Chest Port 1 View  09/21/2014   CLINICAL DATA:  68 year old female with shortness of breath. Left MCA infarct, aspiration pneumonia.  EXAM: PORTABLE CHEST - 1 VIEW  COMPARISON:  09/18/2014 and prior chest radiographs dating back to 05/08/2000  FINDINGS: Cardiomegaly again noted.  Elevation of the right hemidiaphragm and bibasilar atelectasis again noted.  Pulmonary vascular congestion again identified.  There has been interval improvement in left lung aeration.  There is no evidence of pneumothorax.  No other significant change identified.  IMPRESSION: Improved left lung aeration without other significant change.   Electronically Signed   By: Laveda Abbe M.D.   On: 09/21/2014 08:15   Dg Chest Portable 1 View  09/18/2014   CLINICAL DATA:  Stroke.  EXAM: PORTABLE CHEST - 1 VIEW  COMPARISON:  Chest CT 05/11/2014  FINDINGS: Single view of the chest demonstrates low lung volumes. Patchy airspace densities in the left lung. There may be subtle airspace disease in the right lung. Heart size is accentuated by the low lung volumes.  IMPRESSION: Patchy airspace disease, particularly in the left lung. Findings are concerning for pneumonia or aspiration. Asymmetric pulmonary edema is also in the differential diagnosis.   Electronically Signed   By: Richarda Overlie M.D.   On: 09/18/2014 17:10    Medications: Scheduled Meds: . antiseptic oral rinse  7 mL Mouth Rinse  q12n4p  . aspirin  300 mg Rectal Daily   Or  . aspirin  325 mg Oral Daily  . carvedilol  6.25 mg Oral BID WC  . chlorhexidine  15 mL Mouth Rinse BID  . cloNIDine  0.3 mg Transdermal Weekly  . enoxaparin (LOVENOX) injection  40 mg Subcutaneous Q24H  . feeding  supplement (PRO-STAT SUGAR FREE 64)  30 mL Oral Q24H  . fluticasone  2 spray Each Nare Daily  . free water  100 mL Per Tube 6 times per day  . hydrALAZINE  10 mg Intravenous Q8H  . insulin aspart  0-9 Units Subcutaneous 6 times per day  . ipratropium-albuterol  3 mL Nebulization BID  . levothyroxine  50 mcg Intravenous Daily  . pantoprazole (PROTONIX) IV  40 mg Intravenous Q12H      LOS: 12 days   RAI,RIPUDEEP M.D. [facility] 09/30/2014, 11:17 AM Pager: [phone]  If 7PM-7AM, please contact night-coverage [email or web address] Password [email or web address]

## 2014-09-30 NOTE — Progress Notes (Signed)
NUTRITION FOLLOW UP  Intervention:   Re-Initiate Glucerna 1.2 @ 55 ml/hr.   30 ml Prostat once daily.    Tube feeding regimen will provide 1684 kcal (100% of needs), 94 grams of protein, and 1069 ml of H2O.   After transfer to SNF, can change TF regimen to bolus feeds. Recommend Glucerna 1.2 @ four bolus feedings of 330 ml. Prostat- 30 ml once per day. TF regimen will provide 1684 kcal, 94 g protein, and 1056 ml of fluid to meet 100% of estimated calorie and protein needs.    Recommend free water flushes of 60 ml before and after each bolus and one additional 200 ml flush to provide additional 680 ml of free water.   Nutrition Dx:   Inadequate oral intake related to inability to eat, s/p CVA as evidenced by NPO status; ongoing  Goal:   Pt to meet >/= 90% of their estimated nutrition needs; currently unmet  Monitor:   TF regimen & tolerance, weight, labs, I/O's  Assessment:   68 y.o. female with PMH significant for HTN, hyperlipidemia, renal cell carcinoma s/p right nephrectomy, multiple myeloma on chemotherapy, transferred to [facility] for further stroke management.  MRI showed large acute L MCA stroke and numerous scattered small acute infarcts in the bilateral MCA, right PCA, and bilateral PICA territories with right hemiparesis. 10/2: RD consulted for TF initiation & management.  10/7: Per chart TF to be held at midnight for PEG placement tomorrow. Pt asleep at time of visit, TF turned off. Per nursing notes, pt was receiving Glucerna 1.2 at goal rate of 65 ml/hr since 10/4 until at least 2 AM this morning. Residuals have been 0 ml. Pt's weight has trended up 14 lbs in the past 9 days.  Labs: elevated glucose, low albumin, decreased GFR  10/9: - Pt to be transferred to SNF, possibly today.  - Pt was at goal rate prior to PEG placement 10/8. Will re-start goal rate.  Patient has PEG in place with tip of tube in stomach. Glucerna 1.2 is infusing @ 20 ml/hr. 30 ml Prostat via tube once  daily. Tube feeding regimen currently providing 576 kcal, 29 grams protein, and 389 ml H2O.   Residuals: none  Last bm: 10/6  Height: Ht Readings from Last 1 Encounters:  09/19/14 $RemoveB'5\' 5"'OlzuyJvh$  (1.651 m)    Weight Status:   Wt Readings from Last 1 Encounters:  09/30/14 238 lb 1.6 oz (108 kg)    Re-estimated needs:  Kcal: 1600-1750 Protein: 85 to 100 grams Fluid: 1.6-1.8 L/day  Skin: +1 RUE, RLE, and LLE edema  Diet Order: NPO    Intake/Output Summary (Last 24 hours) at 09/30/14 1438 Last data filed at 09/29/14 1700  Gross per 24 hour  Intake      0 ml  Output      0 ml  Net      0 ml    Last BM: 10/6   Labs:   Recent Labs Lab 09/28/14 0924 09/29/14 0815 09/30/14 0704  NA 145 141 141  K 4.3 4.4 4.1  CL 109 108 108  CO2 $Re'25 24 24  'IsB$ BUN 29* 17 15  CREATININE 1.11* 1.07 1.24*  CALCIUM 8.4 8.7 8.7  GLUCOSE 223* 166* 111*    CBG (last 3)   Recent Labs  09/30/14 0421 09/30/14 0757 09/30/14 1121  GLUCAP 112* 119* 108*    Scheduled Meds: . amLODipine  10 mg Per Tube q morning - 10a  . antiseptic oral rinse  7 mL Mouth Rinse q12n4p  . aspirin  300 mg Rectal Daily   Or  . aspirin  325 mg Oral Daily  . carvedilol  6.25 mg Oral BID WC  . chlorhexidine  15 mL Mouth Rinse BID  . cloNIDine  0.3 mg Transdermal Weekly  . enoxaparin (LOVENOX) injection  40 mg Subcutaneous Q24H  . feeding supplement (PRO-STAT SUGAR FREE 64)  30 mL Oral Q24H  . fluticasone  2 spray Each Nare Daily  . free water  100 mL Per Tube 6 times per day  . hydrALAZINE  25 mg Per Tube TID  . insulin aspart  0-9 Units Subcutaneous 6 times per day  . ipratropium-albuterol  3 mL Nebulization BID  . levothyroxine  50 mcg Intravenous Daily  . pantoprazole (PROTONIX) IV  40 mg Intravenous Q12H  . pravastatin  40 mg Per Tube q1800    Continuous Infusions: . feeding supplement (GLUCERNA 1.2 CAL) 1,000 mL (09/30/14 1212)    Laurette Schimke RD, LDN

## 2014-09-30 NOTE — Progress Notes (Signed)
Speech Language Pathology Treatment: Cognitive-Linquistic;Dysphagia  Patient Details Name: Melissa [name] MRN: [id number] DOB: [birth year] Today's Date: 09/30/2014 Time: 1440-1501 SLP Time Calculation (min): 21 min  Assessment / Plan / Recommendation Clinical Impression  Pt demonstrates improved arousal, particularly after assist from OT for seating on edge of bed. Provided total fading to max hand over hand assist to initiate self feeding and trigger mouth opening and automatic responses to PO trials. Pt with continued slow oral manipulation with verbal, visual and tactile cues to reduce oral holding. Pt appears to have fleeting attention to functional tasks and needs constant cueing to redirect. No attempts at functional communication despite max automatic cues. Pt to continue NPO with PEG, though she may have occasional ice chips with her daughter who has been properly instructed in administration. Recommend ongoing SLP therapy here and at next level of care.    HPI     Pertinent Vitals Pain Assessment: Faces Faces Pain Scale: No hurt  SLP Plan  Continue with current plan of care    Recommendations Diet recommendations: NPO (except ice chips if alert)              Oral Care Recommendations: Oral care BID Follow up Recommendations: Skilled Nursing facility Plan: Continue with current plan of care    GO    [facility], MA CCC-SLP [phone]  Lynann Beaver 09/30/2014, 3:20 PM

## 2014-09-30 NOTE — Clinical Social Work Note (Signed)
CSW met with the pt's daughter Jonelle Sidle. CSW informed [name] that the only SNF willing to accept her mother is [facility]. [name] reported that she will tour the SNF and meet with Narda Rutherford the admission director. [name] reported that she will informed the rest of the family so they can make a finish decision.   [place], MSW, [place]

## 2014-09-30 NOTE — Progress Notes (Signed)
Patient ID: Melissa [name], female   DOB: 17-Nov-1946, 68 y.o.   MRN: [id number]     Subjective:  Pt without acute changes; family in room  Allergies: Morphine  Medications: Prior to Admission medications   Medication Sig Start Date End Date Taking? Authorizing Provider  acyclovir (ZOVIRAX) 400 MG tablet Take 1 tablet (400 mg total) by mouth daily. 08/30/14  Yes Alla German, MD  amLODipine (NORVASC) 10 MG tablet Take 10 mg by mouth every morning.   Yes Historical Provider, MD  aspirin 81 MG chewable tablet Chew 81 mg by mouth daily.   Yes Historical Provider, MD  atenolol (TENORMIN) 25 MG tablet Take 25 mg by mouth every morning.    Yes Historical Provider, MD  Bortezomib (VELCADE IJ) Inject as directed. Days 1, 4, 8, 11 every 21 days 09/05/14  Yes Historical Provider, MD  budesonide-formoterol (SYMBICORT) 160-4.5 MCG/ACT inhaler Inhale 2 puffs into the lungs daily as needed (Shortness Of Breath).    Yes Historical Provider, MD  calcitRIOL (ROCALTROL) 0.25 MCG capsule 0.25 mcg every Monday, Wednesday, and Friday.    Yes Historical Provider, MD  dexamethasone (DECADRON) 4 MG tablet Take 10 mg by mouth as directed. Take a dose the day before and the day after Velcade treatments.   Yes Historical Provider, MD  FLUoxetine (PROZAC) 10 MG tablet Take 10 mg by mouth every morning.   Yes Historical Provider, MD  fluticasone (FLONASE) 50 MCG/ACT nasal spray Place 2 sprays into both nostrils daily. 08/26/14  Yes Alla German, MD  furosemide (LASIX) 40 MG tablet Take 40 mg by mouth 2 (two) times daily.   Yes Historical Provider, MD  hydrALAZINE (APRESOLINE) 25 MG tablet Take 25 mg by mouth 3 (three) times daily.  06/26/12  Yes Kerri Perches, MD  hydrALAZINE (APRESOLINE) 50 MG tablet Take 50 mg by mouth 3 (three) times daily.   Yes Historical Provider, MD  lenalidomide (REVLIMID) 25 MG capsule Take 25 mg by mouth as directed. Take 1 capsule daily for 14 days, rest 7 days, and repeat. 08/30/14   Yes Alla German, MD  levothyroxine (SYNTHROID, LEVOTHROID) 100 MCG tablet Take 100 mcg by mouth every Monday, Wednesday, and Friday.   Yes Historical Provider, MD  levothyroxine (SYNTHROID, LEVOTHROID) 50 MCG tablet Take 50 mcg by mouth every Tuesday, Thursday, Saturday, and Sunday.   Yes Historical Provider, MD  lovastatin (MEVACOR) 40 MG tablet Take 40 mg by mouth daily.   Yes Historical Provider, MD  meclizine (ANTIVERT) 25 MG tablet Take 25 mg by mouth 3 (three) times daily as needed for dizziness.   Yes Historical Provider, MD  Multiple Vitamin (MULTIVITAMIN WITH MINERALS) TABS tablet Take 1 tablet by mouth daily.   Yes Historical Provider, MD  naproxen sodium (ALEVE) 220 MG tablet Take 220 mg by mouth 2 (two) times daily as needed.   Yes Historical Provider, MD  prochlorperazine (COMPAZINE) 10 MG tablet Take 1 tablet (10 mg total) by mouth every 6 (six) hours as needed for nausea or vomiting. 08/29/14  Yes Alla German, MD  spironolactone (ALDACTONE) 25 MG tablet Take 50 mg by mouth daily.    Yes Historical Provider, MD    Review of Systems  see above  Vital Signs: BP 166/64  Pulse 72  Temp(Src) 98.3 F (36.8 C) (Oral)  Resp 18  Ht 5\' 5"  (1.651 m)  Wt 238 lb 1.6 oz (108 kg)  BMI 39.62 kg/m2  SpO2 100%  Physical Exam pt awake; still not F/C; G  tube intact, insertion site ok, no leaking, abd soft, minimal tenderness  Imaging: Ct Head Wo Contrast  09/29/2014   CLINICAL DATA:  Recent large stroke, somnolent for last 2 days, personal history of hyperlipidemia, hypertension, renal cell carcinoma  EXAM: CT HEAD WITHOUT CONTRAST  TECHNIQUE: Contiguous axial images were obtained from the base of the skull through the vertex without intravenous contrast.  COMPARISON:  09/18/2014  FINDINGS: Cavum septum pellucidum.  Again identified large LEFT MCA territory infarct with mass effect/compression of the LEFT lateral ventricle.  4 mm of LEFT-to-RIGHT midline shift.  No hydrocephalus.  Small  old RIGHT frontal, LEFT basal ganglia and LEFT cerebellar infarcts.  No definite intracranial hemorrhage, mass lesion or extra-axial fluid collection.  Small vessel chronic ischemic changes of deep cerebral white matter.  Bones and sinuses unremarkable.  IMPRESSION: Large subacute LEFT MCA territory infarct involving the LEFT temporal and parietal lobes and LEFT insula.  Mild mass effect in LEFT hemisphere with 4 mm of LEFT-to-RIGHT midline shift.  Old infarcts RIGHT frontal, LEFT basal ganglia, and LEFT cerebellar.   Electronically Signed   By: Ulyses Southward M.D.   On: 09/29/2014 10:31   Ir Gastrostomy Tube Mod Sed  09/29/2014   CLINICAL DATA:  Dysphagia, cerebral infarction  EXAM: FLUOROSCOPIC 20 FRENCH PULL-THROUGH GASTROSTOMY  Date:  10/8/201510/07/2014 3:46 pm  Radiologist:  M. Ruel Favors, MD  Guidance:  FLUOROSCOPIC  FLUOROSCOPY TIME:  2.0 Minutes  MEDICATIONS AND MEDICAL HISTORY: 2 g AncefAdministered within 1 hour of the procedure,1% lidocaine locally  ANESTHESIA/SEDATION: None.  CONTRAST:  25mL OMNIPAQUE IOHEXOL 300 MG/ML  SOLN  COMPLICATIONS: None immediate  PROCEDURE: Informed consent was obtained from the patient following explanation of the procedure, risks, benefits and alternatives. The patient understands, agrees and consents for the procedure. All questions were addressed. A time out was performed.  Maximal barrier sterile technique utilized including caps, mask, sterile gowns, sterile gloves, large sterile drape, hand hygiene, and betadine prep.  The left upper quadrant was sterilely prepped and draped. An oral gastric catheter was inserted into the stomach under fluoroscopy. The existing nasogastric feeding tube was removed. Air was injected into the stomach for insufflation and visualization under fluoroscopy. The air distended stomach was confirmed beneath the anterior abdominal wall in the frontal and lateral projections. Under sterile conditions and local anesthesia, a 17 gauge trocar needle  was utilized to access the stomach percutaneously beneath the left subcostal margin. Needle position was confirmed within the stomach under biplane fluoroscopy. Contrast injection confirmed position also. A single T tack was deployed for gastropexy. Over an Amplatz guide wire, a 9-French sheath was inserted into the stomach. A snare device was utilized to capture the oral gastric catheter. The snare device was pulled retrograde from the stomach up the esophagus and out the oropharynx. The 20-French pull-through gastrostomy was connected to the snare device and pulled antegrade through the oropharynx down the esophagus into the stomach and then through the percutaneous tract external to the patient. The gastrostomy was assembled externally. Contrast injection confirms position in the stomach. Images were obtained for documentation. The patient tolerated procedure well. No immediate complication.  IMPRESSION: Fluoroscopic insertion of a 20-French "pull-through" gastrostomy.   Electronically Signed   By: Ruel Favors M.D.   On: 09/29/2014 16:39   Dg Chest Port 1 View  09/28/2014   CLINICAL DATA:  History of previous CVA; suspect aspiration pneumonia; patient unresponsive  EXAM: PORTABLE CHEST - 1 VIEW  COMPARISON:  Portable chest x-ray of September 21, 2014  FINDINGS: The lungs are hypoinflated. There is no alveolar infiltrate. However, the left hemidiaphragm remains obscured. The pulmonary interstitial markings are mildly increased predominantly on the left. The cardiopericardial silhouette is mildly enlarged. The feeding tube tip lies in the region of the distal esophagus.  IMPRESSION: 1. There is no definite pneumonia but lower lobe atelectasis or effusion on the left is suspected. 2. The radiodense tipped bulb of the feeding tube overlies the region of the lower esophagus. Advancement by at least 10-20 cm is recommended. 3. These results will be called to the ordering clinician or representative by the  Radiologist Assistant, and communication documented in the PACS or zVision Dashboard.   Electronically Signed   By: David  [place]   On: 09/28/2014 08:44    Labs:  CBC:  Recent Labs  09/21/14 0347 09/22/14 0348 09/24/14 0442 09/27/14 0719  WBC 11.1* 9.6 10.7* 8.3  HGB 10.9* 10.6* 10.8* 10.8*  HCT 31.1* 30.0* 30.9* 31.2*  PLT 54* 100* 238 257    COAGS:  Recent Labs  08/15/14 0800 09/18/14 1558  INR 1.12 1.13  APTT 40*  --     BMP:  Recent Labs  09/27/14 0857 09/28/14 0924 09/29/14 0815 09/30/14 0704  NA 148* 145 141 141  K 4.2 4.3 4.4 4.1  CL 113* 109 108 108  CO2 26 25 24 24   GLUCOSE 203* 223* 166* 111*  BUN 44* 29* 17 15  CALCIUM 9.0 8.4 8.7 8.7  CREATININE 1.33* 1.11* 1.07 1.24*  GFRNONAA 40* 50* 52* 44*  GFRAA 46* 58* 60* 51*    LIVER FUNCTION TESTS:  Recent Labs  09/27/14 0857 09/28/14 0924 09/29/14 0815 09/30/14 0704  BILITOT 0.3 0.3 0.4 0.4  AST 15 22 20 19   ALT 32 35 32 27  ALKPHOS 61 67 46 44  PROT 5.5* 5.2* 5.2* 5.3*  ALBUMIN 2.3* 2.1* 2.2* 2.2*    Assessment and Plan: S/p perc gastrostomy tube 10/8; ok to use tube; other plans as per primary team        Signed: Chinita Pester 09/30/2014, 1:09 PM

## 2014-10-01 LAB — COMPREHENSIVE METABOLIC PANEL
ALT: 27 U/L (ref 0–35)
AST: 23 U/L (ref 0–37)
Albumin: 2.1 g/dL — ABNORMAL LOW (ref 3.5–5.2)
Alkaline Phosphatase: 49 U/L (ref 39–117)
Anion gap: 11 (ref 5–15)
BUN: 17 mg/dL (ref 6–23)
CO2: 24 mEq/L (ref 19–32)
Calcium: 8.9 mg/dL (ref 8.4–10.5)
Chloride: 107 mEq/L (ref 96–112)
Creatinine, Ser: 1.25 mg/dL — ABNORMAL HIGH (ref 0.50–1.10)
GFR calc Af Amer: 50 mL/min — ABNORMAL LOW (ref >90)
GFR calc non Af Amer: 43 mL/min — ABNORMAL LOW (ref >90)
Glucose, Bld: 132 mg/dL — ABNORMAL HIGH (ref 70–99)
Potassium: 4.1 mEq/L (ref 3.7–5.3)
Sodium: 142 mEq/L (ref 137–147)
Total Bilirubin: 0.3 mg/dL (ref 0.3–1.2)
Total Protein: 5.2 g/dL — ABNORMAL LOW (ref 6.0–8.3)

## 2014-10-01 LAB — GLUCOSE, CAPILLARY
GLUCOSE-CAPILLARY: 125 mg/dL — AB (ref 70–99)
GLUCOSE-CAPILLARY: 132 mg/dL — AB (ref 70–99)
Glucose-Capillary: 114 mg/dL — ABNORMAL HIGH (ref 70–99)
Glucose-Capillary: 131 mg/dL — ABNORMAL HIGH (ref 70–99)

## 2014-10-01 MED ORDER — AMLODIPINE BESYLATE 10 MG PO TABS: 10.0000 mg | ORAL_TABLET | Freq: Every morning | ORAL | Status: DC

## 2014-10-01 MED ORDER — FREE WATER: 100.0000 mL | Status: AC

## 2014-10-01 MED ORDER — CARVEDILOL 6.25 MG PO TABS: 6.2500 mg | ORAL_TABLET | Freq: Two times a day (BID) | ORAL | Status: DC

## 2014-10-01 MED ORDER — CLONIDINE HCL 0.3 MG/24HR TD PTWK: 0.3000 mg | MEDICATED_PATCH | TRANSDERMAL | Status: DC

## 2014-10-01 MED ORDER — PROCHLORPERAZINE MALEATE 10 MG PO TABS: 10.0000 mg | ORAL_TABLET | Freq: Four times a day (QID) | ORAL | Status: DC | PRN

## 2014-10-01 MED ORDER — HYDRALAZINE HCL 50 MG PO TABS: 50.0000 mg | ORAL_TABLET | Freq: Three times a day (TID) | ORAL | Status: DC

## 2014-10-01 MED ORDER — LEVOTHYROXINE SODIUM 100 MCG PO TABS: 100.0000 ug | ORAL_TABLET | ORAL | Status: DC

## 2014-10-01 MED ORDER — ASPIRIN 325 MG PO TABS
325.0000 mg | ORAL_TABLET | Freq: Every day | ORAL | Status: DC
  Administered 2014-10-01: 325 mg
  Filled 2014-10-01: qty 1

## 2014-10-01 MED ORDER — LOVASTATIN 40 MG PO TABS: 40.0000 mg | ORAL_TABLET | Freq: Every day | ORAL | Status: DC

## 2014-10-01 MED ORDER — GLUCERNA 1.2 CAL PO LIQD: 1000.0000 mL | ORAL | Status: DC

## 2014-10-01 MED ORDER — PRO-STAT SUGAR FREE PO LIQD: 30.0000 mL | ORAL | Status: DC

## 2014-10-01 MED ORDER — LEVOTHYROXINE SODIUM 50 MCG PO TABS: 50.0000 ug | ORAL_TABLET | ORAL | Status: DC

## 2014-10-01 MED ORDER — ALBUTEROL SULFATE (2.5 MG/3ML) 0.083% IN NEBU: 2.5000 mg | INHALATION_SOLUTION | RESPIRATORY_TRACT | Status: DC | PRN

## 2014-10-01 MED ORDER — ASPIRIN 325 MG PO TABS: 325.0000 mg | ORAL_TABLET | Freq: Every day | ORAL | Status: DC

## 2014-10-01 MED ORDER — PANTOPRAZOLE SODIUM 40 MG PO PACK: 40.0000 mg | PACK | Freq: Two times a day (BID) | ORAL | Status: DC

## 2014-10-01 NOTE — Progress Notes (Signed)
CSW (Clinical Education officer, museum) notified by Camera operator and MD that pt will be ready for dc today. CSW spoke with pt daughter Jonelle Sidle and confirmed plan is for [facility]. CSW spoke with facility and they confirmed they can accept pt today and request that family be at facility at 2:00pm to complete paperwork. CSW notified family of this request. CSW to arrange non-emergent ambulance transport once facility notifies CSW that family is present to complete paperwork. Pt nurse notified of anticipated dc around 2:00pm.  [place], [place]

## 2014-10-01 NOTE — Progress Notes (Addendum)
CSW (Clinical Education officer, museum) notified by facility that they may not be able to accept pt for dc this weekend. Facility may not be able to handle pt PEG tube feedings until they can order necessary items on Monday. Awaiting return phone call from facility after they check their supplies.   ADDENDUM: Facility notified CSW that they do have necessary materials and pt can dc today. Pt transportation remains scheduled at 2:30pm.   [place], [place]

## 2014-10-01 NOTE — Progress Notes (Signed)
Report called to SNF; patient ready for discharge; awaiting ambulance transfer; all personal belongings returned and taken by her daughter from the room.

## 2014-10-01 NOTE — Discharge Summary (Signed)
Physician Discharge Summary  Patient ID: Melissa [name] MRN: [id number] DOB/AGE: 02-11-1946 68 y.o.  Admit date: 09/18/2014 Discharge date: 10/01/2014  Primary Care Physician:  [name], MD  Discharge Diagnoses:    . 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 Aspiration pneumonia . Thrombocytopenia, unspecified . Elevated troponin . CKD (chronic kidney disease) stage 3, GFR 30-59 ml/min History of renal cell carcinoma status post right nephrectomy Multiple myeloma (chemotherapy currently on hold) Hypothyroidism Hypertension  Consults:  Neurology/stroke service, Dr. Pearlean Brownie                   Interventional radiology for PEG tube placement   Recommendations for Outpatient Follow-up:  DISCHARGE DIET: Glucerna per tube at 55 cc an hour  Free water 100 cc every 4 hours  Patient's daughter was recommended to call the oncologist, Dr Zigmund Daniel for further recommendations when to start the chemotherapy again.   Allergies:   Allergies  Allergen Reactions  . Morphine Nausea And Vomiting     Discharge Medications:   Medication List    STOP taking these medications       acyclovir 400 MG tablet  Commonly known as:  ZOVIRAX     ALEVE 220 MG tablet  Generic drug:  naproxen sodium     aspirin 81 MG chewable tablet  Replaced by:  aspirin 325 MG tablet     atenolol 25 MG tablet  Commonly known as:  TENORMIN     dexamethasone 4 MG tablet  Commonly known as:  DECADRON     FLUoxetine 10 MG tablet  Commonly known as:  PROZAC     furosemide 40 MG tablet  Commonly known as:  LASIX     lenalidomide 25 MG capsule  Commonly known as:  REVLIMID     spironolactone 25 MG tablet  Commonly known as:  ALDACTONE     VELCADE IJ      TAKE these medications       albuterol (2.5 MG/3ML) 0.083% nebulizer solution  Commonly known as:  PROVENTIL  Take 3 mLs (2.5 mg total) by  nebulization every 4 (four) hours as needed for wheezing or shortness of breath.     amLODipine 10 MG tablet  Commonly known as:  NORVASC  Place 1 tablet (10 mg total) into feeding tube every morning.     aspirin 325 MG tablet  Place 1 tablet (325 mg total) into feeding tube daily.     budesonide-formoterol 160-4.5 MCG/ACT inhaler  Commonly known as:  SYMBICORT  Inhale 2 puffs into the lungs daily as needed (Shortness Of Breath).     calcitRIOL 0.25 MCG capsule  Commonly known as:  ROCALTROL  0.25 mcg every Monday, Wednesday, and Friday.     carvedilol 6.25 MG tablet  Commonly known as:  COREG  Place 1 tablet (6.25 mg total) into feeding tube 2 (two) times daily with a meal.     cloNIDine 0.3 mg/24hr patch  Commonly known as:  CATAPRES - Dosed in mg/24 hr  Place 1 patch (0.3 mg total) onto the skin once a week.     feeding supplement (GLUCERNA 1.2 CAL) Liqd  Place 1,000 mLs into feeding tube continuous. At 55cc/hr     feeding supplement (PRO-STAT SUGAR FREE 64) Liqd  Take 30 mLs by mouth daily.     fluticasone 50 MCG/ACT nasal spray  Commonly known as:  FLONASE  Place 2 sprays  into both nostrils daily.     free water Soln  Place 100 mLs into feeding tube every 4 (four) hours.     hydrALAZINE 50 MG tablet  Commonly known as:  APRESOLINE  Place 1 tablet (50 mg total) into feeding tube 3 (three) times daily.     levothyroxine 100 MCG tablet  Commonly known as:  SYNTHROID, LEVOTHROID  Place 1 tablet (100 mcg total) into feeding tube every Monday, Wednesday, and Friday.     levothyroxine 50 MCG tablet  Commonly known as:  SYNTHROID, LEVOTHROID  Place 1 tablet (50 mcg total) into feeding tube every Tuesday, Thursday, Saturday, and Sunday.     lovastatin 40 MG tablet  Commonly known as:  MEVACOR  Place 1 tablet (40 mg total) into feeding tube at bedtime.     meclizine 25 MG tablet  Commonly known as:  ANTIVERT  Take 25 mg by mouth 3 (three) times daily as needed for  dizziness.     multivitamin with minerals Tabs tablet  Take 1 tablet by mouth daily.     pantoprazole sodium 40 mg/20 mL Pack  Commonly known as:  PROTONIX  Place 20 mLs (40 mg total) into feeding tube 2 (two) times daily.     prochlorperazine 10 MG tablet  Commonly known as:  COMPAZINE  Place 1 tablet (10 mg total) into feeding tube every 6 (six) hours as needed for nausea or vomiting.         Brief H and P: For complete details please refer to admission H and P, but in brief [name] is a 68 y.o. female  With a history of obesity, hypothyroidism, hypertension, hyperlipidemia, history of kidney cancer status post right nephrectomy, pulmonary fibrosis, multiple myeloma currently undergoing chemotherapy. Patient was not able to provide any history in ED and history was obtained from the daughter.  She was brought to the hospital by EMS due to altered mental status. The patient was found by her daughter at approximately 2 PM. She had fallen on the ground at an unknown time and was found on the floor by her daughter. She was altered at that point and had weakness of her right side including right facial droop and was not verbalizing   Hospital Course:  68 y.o F Hx HTN, hyperlipidemia, renal cell carcinoma s/p right nephrectomy, pulmonary fibrosis, multiple myeloma on chemotherapy (Revlimid 25 mg days 1-14 every 21 days / Velcade D1, 4, 8, and 11 every 21 days / dexamethasone) last seen by Dr. Alla German (Oncology) on 9/21, who was transferred to [facility] for further stroke management.  She was initially evaluated at [facility] where notes indicated that she was brought to the hospital by EMS due to altered mental status, found by her daughter. Code stroke was called and Neurology determined not to proceed with TPA due to unknown time of onset.  MRI/MRA brain showed large acute left MCA infarct. Cytotoxic edema and mass effect but no associated hemorrhage at this time. Numerous scattered  small acute infarcts in the bilateral MCA, right PCA, and bilateral PICA territories. Constellation most compatible with recent embolic event (cardiac or proximal aortic source).  Further workup including a TEE showed a PFO,   Patient's family requested a PEG tube placement, IR was consulted, PEG tube was placed on 10/8, patient has been tolerating tube feeds.  Large Acute L MCA stroke and numerous scattered small acute infarcts in the bilateral MCA, right PCA, and bilateral PICA territories with right hemiparesis  Findings consistent with cardiac/proximal aortic emboli vs coagulopathy from chemotherapy; no findings on TTE. Patient underwent TEE which did not show cardiac source of emboli, no intracardiac thrombus, there was a PFO. Lower extremity venous Dopplers were negative for DVT. Patient was on aspirin 81 mg prior to permission, now per neurology recommendations placed on aspirin 325 mg daily. Patient had residual dysphagia, palliative medicine was consulted and per goals of care meeting, patient's family requested for PEG tube and rehabilitation. PEG tube was placed on 10/8, patient has been tolerating tube feeds.   Acute encephalopathy/aphasia  - Slowly improving, patient has residual aphasia but per daughter at the bedside, intermittently waking up and following commands.  UA negative for UTI. Repeat CT head showed no acute CVA, large subacute LEFT MCA territory infarct involving the LEFT temporal and parietal lobes and LEFT insula. Mild mass effect in LEFT hemisphere with 4 mm of LEFT-to-RIGHT midline shift. Recent ABG on 10/6 did not show any CO2 narcosis.   Aspiration pneumonia  - Discontinued clindamycin, completed Unasyn on 10/5 after a total of 8 days of treatment for aspiration pneumonia. Chest x-ray 10/7 showed no definitive pneumonia. Continue aspiration precautions.   Diastolic CHF  -Restarted Coreg, hydralazine, amlodipine and clonidine patch  Acute kidney injury , hypernatremia  resolved, continue free water  Mildly Elevated Troponin -Likely due to massive stroke. No EKG changes. Have since normalized . 2-D echo showed EF of 60-65%, no regional wall motion abnormalities.   Thrombocytopenia -Likley due to chemotherapy, improving.  Multiple myeloma  -Patient just ended one cycle of treatment prior to admission and is on a planned 7 day rest - doubt her candidacy to return to active chemotherapy treatments, unless dramatic improvement in clinical status occurs. Recommended the daughter to discuss with patient's oncologist, Dr. Zigmund Daniel when or if to resume chemotherapy.   Hypothyroid  Continue Synthroid via PEG tube  Day of Discharge BP 144/42  Pulse 66  Temp(Src) 98.1 F (36.7 C) (Oral)  Resp 20  Ht 5\' 5"  (1.651 m)  Wt 109.3 kg (240 lb 15.4 oz)  BMI 40.10 kg/m2  SpO2 100%  Physical Exam: General: Somnolent but intermittently alert and awake, aphasia CVS: S1-S2 clear no murmur rubs or gallops Chest: clear to auscultation bilaterally, no wheezing rales or rhonchi Abdomen: soft nontender, nondistended, normal bowel sounds, + PEG tube Extremities: no cyanosis, clubbing or edema noted bilaterally Neuro: Does not follow commands   The results of significant diagnostics from this hospitalization (including imaging, microbiology, ancillary and laboratory) are listed below for reference.    LAB RESULTS: Basic Metabolic Panel:  Recent Labs Lab 09/30/14 0704 10/01/14 0348  NA 141 142  K 4.1 4.1  CL 108 107  CO2 24 24  GLUCOSE 111* 132*  BUN 15 17  CREATININE 1.24* 1.25*  CALCIUM 8.7 8.9   Liver Function Tests:  Recent Labs Lab 09/30/14 0704 10/01/14 0348  AST 19 23  ALT 27 27  ALKPHOS 44 49  BILITOT 0.4 0.3  PROT 5.3* 5.2*  ALBUMIN 2.2* 2.1*   No results found for this basename: LIPASE, AMYLASE,  in the last 168 hours No results found for this basename: AMMONIA,  in the last 168 hours CBC:  Recent Labs Lab 09/27/14 0719  WBC 8.3  HGB  10.8*  HCT 31.2*  MCV 89.1  PLT 257   Cardiac Enzymes: No results found for this basename: CKTOTAL, CKMB, CKMBINDEX, TROPONINI,  in the last 168 hours BNP: No components found with this basename:  POCBNP,  CBG:  Recent Labs Lab 10/01/14 0409 10/01/14 0749  GLUCAP 125* 132*    Significant Diagnostic Studies:  Ct Head Wo Contrast  09/18/2014   CLINICAL DATA:  Code stroke, altered level of consciousness  EXAM: CT HEAD WITHOUT CONTRAST  TECHNIQUE: Contiguous axial images were obtained from the base of the skull through the vertex without intravenous contrast.  COMPARISON:  None  FINDINGS: Cavum septum pellucidum and vergae.  Otherwise normal ventricular morphology.  No midline shift.  Old high RIGHT frontal infarct.  Large acute LEFT MCA territory infarct involving the LEFT temporal and parietal lobes with effacement of sulci.  No definite dense middle cerebral artery sign.  No intracranial hemorrhage, mass lesion or extra-axial fluid collections.  Low-attenuation at posterior aspect of LEFT cerebellar hemisphere may represent a small old infarct as well.  Minimal small vessel chronic ischemic changes of deep cerebral white matter.  No acute bone or sinus findings.  Bony excrescences are identified at the sphenoid wings bilaterally potentially developmental variants or exostoses though difficult to completely exclude calcified meningiomas, considered much less likely.  IMPRESSION: Large LEFT MCA territory infarct involving LEFT temporal and parietal lobes.  No acute intracranial hemorrhage or midline shift.  Old small high RIGHT frontal and questionable LEFT cerebellar infarcts.  Critical Value/emergent results were called by telephone at the time of interpretation on 09/18/2014 at 1556 hr to Dr. Donnetta Hutching, who verbally acknowledged these results.   Electronically Signed   By: Ulyses Southward M.D.   On: 09/18/2014 15:58   Mr Shirlee Latch GN Contrast  09/19/2014   ADDENDUM REPORT: 09/19/2014 10:07   ADDENDUM: Study discussed by telephone with Dr. Kerry Hough On 09/19/2014 at 0958 hrs.   Electronically Signed   By: Augusto Gamble M.D.   On: 09/19/2014 10:07   09/19/2014   CLINICAL DATA:  68 year old female with left MCA infarct detected by CT presenting with code stroke, altered mental status. Initial encounter.  EXAM: MRI HEAD WITHOUT CONTRAST  MRA HEAD WITHOUT CONTRAST  TECHNIQUE: Multiplanar, multiecho pulse sequences of the brain and surrounding structures were obtained without intravenous contrast. Angiographic images of the head were obtained using MRA technique without contrast.  COMPARISON:  Head CT without contrast 09/18/2014.  FINDINGS: MRI HEAD FINDINGS  Large confluent and intensely restricted diffusion in the left hemisphere extending from the insula and operculum posteriorly, involving most of the left occipital lobe as well as the posterior temporal lobe. Small nodular areas of restricted diffusion in the left basal ganglia, which otherwise are spared. Superimposed multiple bilateral punctate and nodular foci of restricted diffusion, including involvement of the right MCA and PCA territories. Furthermore there are multiple nodular foci restricted diffusion in both cerebellar hemispheres. See series 100.  Cytotoxic edema in the affected territories. Still, intracranial mass effect is Mild at this time, with Trace rightward midline shift. No definite petechial hemorrhage.  Major intracranial vascular flow voids are preserved.  Cavum septum pellucidum. Mass effect on the left lateral ventricle. No ventriculomegaly. Basilar cisterns remain patent. Negative pituitary, cervicomedullary junction, and grossly negative visualized cervical spine. Chronic lacunar infarcts superimposed in the right corona radiata and right superior frontal gyrus subcortical white matter (with small focus of cortical encephalomalacia).  Visualized orbit soft tissues are within normal limits. Visualized paranasal sinuses and mastoids are  clear. Normal bone marrow signal. No acute scalp soft tissue findings.  MRA HEAD FINDINGS  Antegrade flow in the posterior circulation with codominant distal vertebral arteries. Patent vertebrobasilar junction. Right PICA  origin is patent. Left PICA less well visualized. Basilar artery irregularity without hemodynamically significant stenosis. Both PCA origins are patent. The right posterior communicating artery is present, the left is diminutive or absent. Bilateral PCA branches are within normal limits.  Antegrade flow in both ICA siphons. Bilateral siphon irregularity is moderate. No definite hemodynamically significant ICA siphon stenosis. Patent carotid termini. Normal right MCA and bilateral ACA origins.  The left MCA is occluded in the M1 segment 10 mm beyond its origin. Paucity of flow signal only in the left MCA anterior division M2 branches.  Anterior communicating artery diminutive or absent. Visualized bilateral ACA branches are within normal limits. Right MCA M1 segment and bifurcation are patent with mild to moderate irregularity. No definite M2 branch occlusion on the right.  IMPRESSION: 1. Large acute left MCA infarct. Cytotoxic edema and mass effect but no associated hemorrhage at this time. 2. Numerous scattered small acute infarcts in the bilateral MCA, right PCA, and bilateral PICA territories. Constellation most compatible with recent embolic event (cardiac or proximal aortic source). 3. Left MCA occlusion in the M1 segment. Some reconstituted flow only in the anterior left MCA division. 4. No other major circle of Willis branch occlusion. Moderate irregularity of both ICA siphons.  Electronically Signed: By: Augusto Gamble M.D. On: 09/19/2014 09:44   Mr Brain Wo Contrast  09/19/2014   ADDENDUM REPORT: 09/19/2014 10:07  ADDENDUM: Study discussed by telephone with Dr. Kerry Hough On 09/19/2014 at 0958 hrs.   Electronically Signed   By: Augusto Gamble M.D.   On: 09/19/2014 10:07   09/19/2014   CLINICAL DATA:   68 year old female with left MCA infarct detected by CT presenting with code stroke, altered mental status. Initial encounter.  EXAM: MRI HEAD WITHOUT CONTRAST  MRA HEAD WITHOUT CONTRAST  TECHNIQUE: Multiplanar, multiecho pulse sequences of the brain and surrounding structures were obtained without intravenous contrast. Angiographic images of the head were obtained using MRA technique without contrast.  COMPARISON:  Head CT without contrast 09/18/2014.  FINDINGS: MRI HEAD FINDINGS  Large confluent and intensely restricted diffusion in the left hemisphere extending from the insula and operculum posteriorly, involving most of the left occipital lobe as well as the posterior temporal lobe. Small nodular areas of restricted diffusion in the left basal ganglia, which otherwise are spared. Superimposed multiple bilateral punctate and nodular foci of restricted diffusion, including involvement of the right MCA and PCA territories. Furthermore there are multiple nodular foci restricted diffusion in both cerebellar hemispheres. See series 100.  Cytotoxic edema in the affected territories. Still, intracranial mass effect is Mild at this time, with Trace rightward midline shift. No definite petechial hemorrhage.  Major intracranial vascular flow voids are preserved.  Cavum septum pellucidum. Mass effect on the left lateral ventricle. No ventriculomegaly. Basilar cisterns remain patent. Negative pituitary, cervicomedullary junction, and grossly negative visualized cervical spine. Chronic lacunar infarcts superimposed in the right corona radiata and right superior frontal gyrus subcortical white matter (with small focus of cortical encephalomalacia).  Visualized orbit soft tissues are within normal limits. Visualized paranasal sinuses and mastoids are clear. Normal bone marrow signal. No acute scalp soft tissue findings.  MRA HEAD FINDINGS  Antegrade flow in the posterior circulation with codominant distal vertebral arteries.  Patent vertebrobasilar junction. Right PICA origin is patent. Left PICA less well visualized. Basilar artery irregularity without hemodynamically significant stenosis. Both PCA origins are patent. The right posterior communicating artery is present, the left is diminutive or absent. Bilateral PCA branches are within normal  limits.  Antegrade flow in both ICA siphons. Bilateral siphon irregularity is moderate. No definite hemodynamically significant ICA siphon stenosis. Patent carotid termini. Normal right MCA and bilateral ACA origins.  The left MCA is occluded in the M1 segment 10 mm beyond its origin. Paucity of flow signal only in the left MCA anterior division M2 branches.  Anterior communicating artery diminutive or absent. Visualized bilateral ACA branches are within normal limits. Right MCA M1 segment and bifurcation are patent with mild to moderate irregularity. No definite M2 branch occlusion on the right.  IMPRESSION: 1. Large acute left MCA infarct. Cytotoxic edema and mass effect but no associated hemorrhage at this time. 2. Numerous scattered small acute infarcts in the bilateral MCA, right PCA, and bilateral PICA territories. Constellation most compatible with recent embolic event (cardiac or proximal aortic source). 3. Left MCA occlusion in the M1 segment. Some reconstituted flow only in the anterior left MCA division. 4. No other major circle of Willis branch occlusion. Moderate irregularity of both ICA siphons.  Electronically Signed: By: Augusto Gamble M.D. On: 09/19/2014 09:44   Dg Chest Portable 1 View  09/18/2014   CLINICAL DATA:  Stroke.  EXAM: PORTABLE CHEST - 1 VIEW  COMPARISON:  Chest CT 05/11/2014  FINDINGS: Single view of the chest demonstrates low lung volumes. Patchy airspace densities in the left lung. There may be subtle airspace disease in the right lung. Heart size is accentuated by the low lung volumes.  IMPRESSION: Patchy airspace disease, particularly in the left lung. Findings are  concerning for pneumonia or aspiration. Asymmetric pulmonary edema is also in the differential diagnosis.   Electronically Signed   By: Richarda Overlie M.D.   On: 09/18/2014 17:10    Transesophageal echo 10/2  Study Conclusions  - Left ventricle: There was mild concentric hypertrophy. Systolic function was vigorous. The estimated ejection fraction was in the range of 65% to 70%. Wall motion was normal; there were no regional wall motion abnormalities. - Aortic valve: No evidence of vegetation. No evidence of vegetation. - Mitral valve: No evidence of vegetation. No evidence of vegetation. There was mild to moderate regurgitation directed centrally. - Left atrium: No evidence of thrombus in the atrial cavity or appendage. No spontaneous echo contrast was observed. - Right atrium: No evidence of thrombus in the atrial cavity or appendage. No evidence of thrombus in the atrial cavity or appendage. - Atrial septum: There was a patent foramen ovale. There was a moderate right-to-left atrial level shunt, in the baseline state. There was redundancy of the septum, with borderline criteria for aneurysm. - Tricuspid valve: No evidence of vegetation. - Pulmonic valve: No evidence of vegetation.  Impressions:  - Consider possible paradoxical embolism as cause of ischemic stroke. Evaluate for lower extremity DVT.   2-D echo 9/28 Study Conclusions  - Left ventricle: The cavity size was normal. Wall thickness was increased in a pattern of mild LVH. Systolic function was normal. The estimated ejection fraction was in the range of 60% to 65%. Wall motion was normal; there were no regional wall motion abnormalities. Doppler parameters are consistent with abnormal left ventricular relaxation (grade 1 diastolic dysfunction). - Mitral valve: There was mild regurgitation. - Right ventricle: The cavity size was mildly dilated. - Right atrium: The atrium was mildly dilated. Central venous pressure  (est): 3 mm Hg. - Atrial septum: No defect or patent foramen ovale was identified. - Tricuspid valve: There was trivial regurgitation. - Pulmonary arteries: PA peak pressure: 52 mm Hg (  S). - Pericardium, extracardiac: There was no pericardial effusion.  Impressions:  - Mild LVH with LVEF 60-65%, grade 1 diastolic dysfunction. Mild mitral regurgitation. Mildly dilated RV with normal contraction. Trivial tricuspid regurgitation, PASP 52 mm mercury. No definitive PFO or ASD. No pericardial effusion.     Disposition and Follow-up:    DISPOSITION: Skilled nursing facility  DIET: Tube feeds, Glucerna at 55 cc an hour    DISCHARGE FOLLOW-UP     Follow-up Information   Follow up with [name], MD. Schedule an appointment as soon as possible for a visit in 2 weeks. (for hospital follow-up)    Specialty:  Family Medicine   Contact information:   [address] [place] [address] [phone]       Follow up with SETHI,PRAMOD, MD. Schedule an appointment as soon as possible for a visit in 2 months. (for hospital follow-up)    Specialties:  Neurology, Radiology   Contact information:   [address] [place] [address] [phone]       Follow up with Maurilio Lovely, MD. Schedule an appointment as soon as possible for a visit on 10/11/2014. (please call or make an appointment with Dr Zigmund Daniel regarding the chemo drugs, currently stopped due to acute stroke)    Specialty:  Hematology and Oncology   Contact information:   [address] [place] [address] [phone]       Time spent on Discharge: 45 mins  Signed:   Riyah Bardon M.D. [facility] 10/01/2014, 10:07 AM Pager: [phone]

## 2014-10-01 NOTE — Progress Notes (Signed)
CSW (Clinical Education officer, museum) prepared pt dc packet and placed with shadow chart. CSW arranged non-emergent ambulance transport for 2:30pm. Pt family, pt nurse, and facility informed. CSW signing off.  [place], [place]

## 2014-10-03 ENCOUNTER — Inpatient Hospital Stay (HOSPITAL_COMMUNITY): Payer: Medicare Other

## 2014-10-06 ENCOUNTER — Inpatient Hospital Stay (HOSPITAL_COMMUNITY): Payer: Medicare Other

## 2014-10-14 ENCOUNTER — Telehealth (HOSPITAL_COMMUNITY): Payer: Self-pay

## 2014-10-14 NOTE — Telephone Encounter (Signed)
Call from daughter, Jonelle Sidle.  Patient is at  [facility] and still has significant right sided weakness.  Requested to move appointment to later date and time due to transportation/mobility issues.  Rescheduled for 10/31/14 @1 :30 pm

## 2014-10-17 ENCOUNTER — Ambulatory Visit (HOSPITAL_COMMUNITY): Payer: Medicare Other

## 2014-10-17 ENCOUNTER — Inpatient Hospital Stay (HOSPITAL_COMMUNITY): Payer: Medicare Other

## 2014-10-18 NOTE — Progress Notes (Signed)
This encounter was created in error - please disregard.

## 2014-10-26 ENCOUNTER — Other Ambulatory Visit (HOSPITAL_COMMUNITY): Payer: Self-pay | Admitting: Oncology

## 2014-10-26 DIAGNOSIS — C9000 Multiple myeloma not having achieved remission: Secondary | ICD-10-CM

## 2014-10-31 ENCOUNTER — Ambulatory Visit (HOSPITAL_COMMUNITY): Payer: Medicare Other

## 2014-10-31 ENCOUNTER — Telehealth: Payer: Self-pay | Admitting: Hematology and Oncology

## 2014-10-31 NOTE — Telephone Encounter (Signed)
LEFT MESSAGE FOR PATIENT DTR Melissa [name] TO RETURN CALL TO SCHEDULE NP APPT.

## 2014-10-31 NOTE — Telephone Encounter (Signed)
S/W PATIENT DTR Melissa [name] AND GAVE NP APPT FOR 11/19 @ 10:45 W/DR. [place].

## 2014-11-02 ENCOUNTER — Ambulatory Visit (INDEPENDENT_AMBULATORY_CARE_PROVIDER_SITE_OTHER): Payer: Medicare Other | Admitting: Nurse Practitioner

## 2014-11-02 ENCOUNTER — Encounter: Payer: Self-pay | Admitting: Nurse Practitioner

## 2014-11-02 VITALS — BP 120/67 | HR 81

## 2014-11-02 DIAGNOSIS — I639 Cerebral infarction, unspecified: Secondary | ICD-10-CM

## 2014-11-02 DIAGNOSIS — IMO0002 Reserved for concepts with insufficient information to code with codable children: Secondary | ICD-10-CM

## 2014-11-02 DIAGNOSIS — G8111 Spastic hemiplegia affecting right dominant side: Secondary | ICD-10-CM

## 2014-11-02 DIAGNOSIS — G8110 Spastic hemiplegia affecting unspecified side: Secondary | ICD-10-CM

## 2014-11-02 DIAGNOSIS — R208 Other disturbances of skin sensation: Secondary | ICD-10-CM

## 2014-11-02 DIAGNOSIS — I69320 Aphasia following cerebral infarction: Secondary | ICD-10-CM

## 2014-11-02 MED ORDER — GABAPENTIN 300 MG PO CAPS: 300.0000 mg | ORAL_CAPSULE | Freq: Three times a day (TID) | ORAL | Status: DC | PRN

## 2014-11-02 NOTE — Progress Notes (Signed)
PATIENT: Melissa [name] DOB: October 16, 1946  REASON FOR VISIT: hospital follow up for stroke HISTORY FROM: daughter  HISTORY OF PRESENT ILLNESS: Melissa [name] Devora is an 68 y.o. female who comes to the office for first hospital follow up post hospital discharge for stroke. She has a past medical history significant for HTN, hyperlipidemia, renal cell carcinoma s/p right nephrectomy, multiple myeloma on chemotherapy, transferred to [facility] for further stroke management. She was initially evaluated at [facility] on 09/18/14 where notes indicated that "she was brought to the hospital by EMS due to altered mental status. The patient was found by her daughter at approximately 2 PM. She had fallen on the ground at an unknown time and was found on the floor by her daughter. She at that point and had weakness of her right side including right facial droop and was not verbalizing. She was last known to be well the night before. MRI/MRA brain showed a large acute left MCA infarct. Cytotoxic edema and mass effect but no associated hemorrhage. Numerous scattered small acute infarcts in the bilateral MCA, right PCA, and bilateral PICA territories.  Constellation most compatible with recent embolic event (cardiac or proximal aortic source). Left MCA occlusion in the M1 segment. Some reconstituted flow only in the anterior left MCA division. LDL 56, HgbA1c 6.4. Patient was not administered TPA secondary to delay in arrival. TEE done showed PFO. Patient continued to be aphasic and dysphagic so gastrostomy tube was placed. She was discharged to [facility] and started PT and OT but was not progressing so therapies were discontinued. She is reportedly eating some pureed food successfully. She comes in today accompanied by her daughter. Before stroke, she had just finished last round of chemotherapy for multiple myeloma.  REVIEW OF SYSTEMS: Full 14 system review of systems performed and notable only for: Right-sided weakness,  swelling in legs, wheezing, joint pain, joint swelling, confusion, numbness, weakness, slurred speech, loss of speech   ALLERGIES: Allergies  Allergen Reactions  . Morphine Nausea And Vomiting    HOME MEDICATIONS: Outpatient Prescriptions Prior to Visit  Medication Sig Dispense Refill  . albuterol (PROVENTIL) (2.5 MG/3ML) 0.083% nebulizer solution Take 3 mLs (2.5 mg total) by nebulization every 4 (four) hours as needed for wheezing or shortness of breath. 75 mL 12  . Amino Acids-Protein Hydrolys (FEEDING SUPPLEMENT, PRO-STAT SUGAR FREE 64,) LIQD Take 30 mLs by mouth daily. 900 mL 0  . amLODipine (NORVASC) 10 MG tablet Place 1 tablet (10 mg total) into feeding tube every morning.    Marland Kitchen aspirin 325 MG tablet Place 1 tablet (325 mg total) into feeding tube daily.    . budesonide-formoterol (SYMBICORT) 160-4.5 MCG/ACT inhaler Inhale 2 puffs into the lungs daily as needed (Shortness Of Breath).     . calcitRIOL (ROCALTROL) 0.25 MCG capsule 0.25 mcg every Monday, Wednesday, and Friday.     . carvedilol (COREG) 6.25 MG tablet Place 1 tablet (6.25 mg total) into feeding tube 2 (two) times daily with a meal.    . cloNIDine (CATAPRES - DOSED IN MG/24 HR) 0.3 mg/24hr patch Place 1 patch (0.3 mg total) onto the skin once a week. 4 patch 12  . fluticasone (FLONASE) 50 MCG/ACT nasal spray Place 2 sprays into both nostrils daily. 16 g 6  . hydrALAZINE (APRESOLINE) 50 MG tablet Place 1 tablet (50 mg total) into feeding tube 3 (three) times daily.    Marland Kitchen levothyroxine (SYNTHROID, LEVOTHROID) 100 MCG tablet Place 1 tablet (100 mcg total) into feeding  tube every Monday, Wednesday, and Friday.    . levothyroxine (SYNTHROID, LEVOTHROID) 50 MCG tablet Place 1 tablet (50 mcg total) into feeding tube every Tuesday, Thursday, Saturday, and Sunday.    . lovastatin (MEVACOR) 40 MG tablet Place 1 tablet (40 mg total) into feeding tube at bedtime.    . meclizine (ANTIVERT) 25 MG tablet Take 25 mg by mouth 3 (three) times  daily as needed for dizziness.    . Multiple Vitamin (MULTIVITAMIN WITH MINERALS) TABS tablet Take 1 tablet by mouth daily.    . Nutritional Supplements (FEEDING SUPPLEMENT, GLUCERNA 1.2 CAL,) LIQD Place 1,000 mLs into feeding tube continuous. At 55cc/hr    . pantoprazole sodium (PROTONIX) 40 mg/20 mL PACK Place 20 mLs (40 mg total) into feeding tube 2 (two) times daily. 30 each   . prochlorperazine (COMPAZINE) 10 MG tablet Place 1 tablet (10 mg total) into feeding tube every 6 (six) hours as needed for nausea or vomiting. 60 tablet 1  . Water For Irrigation, Sterile (FREE WATER) SOLN Place 100 mLs into feeding tube every 4 (four) hours.     No facility-administered medications prior to visit.    PHYSICAL EXAM Filed Vitals:   11/02/14 0827  BP: 120/67  Pulse: 81   Generalized: Well developed, in no acute distress, elderly obese AA female Head: normocephalic and atraumatic. Oropharynx benign  Neck: Supple, no carotid bruits  Cardiac: Regular rate rhythm, no murmur  Musculoskeletal: No deformity   Neurological examination  Mentation: Alert but orientation cannot be determined due to global aphasia Cranial nerve II-XII: Fundoscopic exam not done. Pupils were equal round reactive to light. Visual neglect on the right side. Right facial droop. Tongue in middle inside mouth. Motor: The patient's strength was hemiplegic on the right. RLE withdraw to pain but LUE and LLE spontaneous movement. Bulk was normal and fasciculations were absent.  Sensory: Sensory testing Cannot be done, patient aphasic. Responds to pain. Seems allodynic on right arm. Coordination: Unable to test. Gait and station: Unable to test. Reflexes: Deep tendon reflexes are increased on the right  NIHSS: 28 MRs: 4  ASSESSMENT: Melissa [name] is a 68 y.o. female with history of hypertension, hyperlipidemia, thyroid disease with increased size (bx held given dx multiple myeloma), renal cell cancer s/p nephrectomy  and no recurrent at surgical bed, and recently diagnosed multiple myeloma on chemo presenting with sudden onset right hemiparesis on 09/18/14. She did not receive IV t-PA due to delay in arrival. MRI imaging confirmed a large left MCA infarct, as well as numerous scattered small acute infarcts in the bilateral MCA, right PCA, and bilateral PICA territories, consistent with embolic most likely secondary to either cardioembolic or hypercoagulable state from malignancy or chemo drug - induced (Revlimid).   PLAN: I had a long discussion with the patient and daughter regarding her recent stroke, discussed results of evaluation in the hospital and answered questions.  Start Gabapentin 300 mg three times daily as needed for neuropathic pain, seems to have allodynia in the right arm. Continue rehab therapies per [facility]. Continue aspirin 325 mg orally every day  for secondary stroke prevention and maintain strict control of hypertension with blood pressure goal below 130/90, diabetes with hemoglobin A1c goal below 6.5% and lipids with LDL cholesterol goal below 100 mg/dL. Followup in the future with Dr. Pearlean Brownie in 3 months.  Meds ordered this encounter  Medications  . gabapentin (NEURONTIN) 300 MG capsule    Sig: Take 1 capsule (300 mg total) by  mouth 3 (three) times daily as needed.    Dispense:  90 capsule    Refill:  11    Order Specific Question:  Supervising Provider    Answer:  Pearlean Brownie, PRAMOD [[id number]]   Tawny Asal Orlo Brickle, MSN, FNP-BC, A/GNP-C 11/03/2014, 7:02 PM [facility] [address] [place], [place] [address] [phone]  Note: This document was prepared with digital dictation and possible smart phrase technology. Any transcriptional errors that result from this process are unintentional.

## 2014-11-02 NOTE — Patient Instructions (Addendum)
Continue aspirin 325 mg orally every day  for secondary stroke prevention and maintain strict control of hypertension with blood pressure goal below 130/90, diabetes with hemoglobin A1c goal below 6.5% and lipids with LDL cholesterol goal below 100 mg/dL.  Continue rehab therapies per [facility]. Start Gabapentin 300 mg three times daily as needed for neuropathic pain. Followup in the future with Dr. Leonie Man in 3 months.

## 2014-11-06 ENCOUNTER — Encounter (HOSPITAL_COMMUNITY): Payer: Self-pay | Admitting: Emergency Medicine

## 2014-11-06 ENCOUNTER — Emergency Department (HOSPITAL_COMMUNITY): Payer: Medicare Other

## 2014-11-06 ENCOUNTER — Emergency Department (HOSPITAL_COMMUNITY)
Admission: EM | Admit: 2014-11-06 | Discharge: 2014-11-06 | Disposition: A | Discharge status: Home / Self Care | Payer: Medicare Other | Attending: Emergency Medicine | Admitting: Emergency Medicine

## 2014-11-06 DIAGNOSIS — Z7951 Long term (current) use of inhaled steroids: Secondary | ICD-10-CM | POA: Insufficient documentation

## 2014-11-06 DIAGNOSIS — K9423 Gastrostomy malfunction: Secondary | ICD-10-CM | POA: Diagnosis present

## 2014-11-06 DIAGNOSIS — Z931 Gastrostomy status: Secondary | ICD-10-CM

## 2014-11-06 DIAGNOSIS — Z7982 Long term (current) use of aspirin: Secondary | ICD-10-CM | POA: Insufficient documentation

## 2014-11-06 DIAGNOSIS — Z905 Acquired absence of kidney: Secondary | ICD-10-CM | POA: Diagnosis not present

## 2014-11-06 DIAGNOSIS — R109 Unspecified abdominal pain: Secondary | ICD-10-CM | POA: Diagnosis not present

## 2014-11-06 DIAGNOSIS — I10 Essential (primary) hypertension: Secondary | ICD-10-CM | POA: Diagnosis not present

## 2014-11-06 DIAGNOSIS — E669 Obesity, unspecified: Secondary | ICD-10-CM | POA: Insufficient documentation

## 2014-11-06 DIAGNOSIS — Z9889 Other specified postprocedural states: Secondary | ICD-10-CM | POA: Insufficient documentation

## 2014-11-06 DIAGNOSIS — Z9851 Tubal ligation status: Secondary | ICD-10-CM | POA: Diagnosis not present

## 2014-11-06 DIAGNOSIS — Z9861 Coronary angioplasty status: Secondary | ICD-10-CM | POA: Insufficient documentation

## 2014-11-06 DIAGNOSIS — E039 Hypothyroidism, unspecified: Secondary | ICD-10-CM | POA: Insufficient documentation

## 2014-11-06 DIAGNOSIS — Z8719 Personal history of other diseases of the digestive system: Secondary | ICD-10-CM | POA: Insufficient documentation

## 2014-11-06 DIAGNOSIS — E785 Hyperlipidemia, unspecified: Secondary | ICD-10-CM | POA: Insufficient documentation

## 2014-11-06 DIAGNOSIS — Z79899 Other long term (current) drug therapy: Secondary | ICD-10-CM | POA: Diagnosis not present

## 2014-11-06 DIAGNOSIS — R52 Pain, unspecified: Secondary | ICD-10-CM

## 2014-11-06 LAB — URINALYSIS, ROUTINE W REFLEX MICROSCOPIC
BILIRUBIN URINE: NEGATIVE
Glucose, UA: NEGATIVE mg/dL
HGB URINE DIPSTICK: NEGATIVE
Ketones, ur: NEGATIVE mg/dL
Leukocytes, UA: NEGATIVE
NITRITE: NEGATIVE
Protein, ur: NEGATIVE mg/dL
SPECIFIC GRAVITY, URINE: 1.015 (ref 1.005–1.030)
Urobilinogen, UA: 0.2 mg/dL (ref 0.0–1.0)
pH: 6.5 (ref 5.0–8.0)

## 2014-11-06 MED ORDER — HYDROMORPHONE HCL 1 MG/ML IJ SOLN
1.0000 mg | Freq: Once | INTRAMUSCULAR | Status: AC
  Administered 2014-11-06: 1 mg via INTRAMUSCULAR
  Filled 2014-11-06: qty 1

## 2014-11-06 MED ORDER — ONDANSETRON HCL 4 MG/2ML IJ SOLN
4.0000 mg | Freq: Once | INTRAMUSCULAR | Status: AC
  Administered 2014-11-06: 4 mg via INTRAMUSCULAR
  Filled 2014-11-06: qty 2

## 2014-11-06 NOTE — ED Provider Notes (Addendum)
CSN: [id number]     Arrival date & time 11/06/14  0718 History   First MD Initiated Contact with Patient 11/06/14 [phone]     Chief Complaint  Patient presents with  . GI Problem    G-TUBE displacement     (Consider location/radiation/quality/duration/timing/severity/associated sxs/prior Treatment) HPI Comments: Patient here after the nursing home staff was unable to flush her G-tube.  The concern is that she may have dislodged inadvertently. She was sent here for G-tube verification of placement. No other complaints noted.  Patient is a 68 y.o. female presenting with GI illness. The history is provided by the patient and a caregiver.  GI Problem    Past Medical History  Diagnosis Date  . Vertigo, intermittent   . Hypothyroidism   . Hyperlipidemia   . Obesity   . Hypertension     severe and resistant to treatment; 2008-negative left renal angiogram  . Vitiligo   . Degenerative disc disease, lumbar   . Degenerative disc disease, cervical   . Chest pain 2008    2008-normal coronary angiography  . Renal cell carcinoma     Right nephrectomy   Past Surgical History  Procedure Laterality Date  . Tubal ligation  1983    Bilateral  . Nephrectomy      Right; secondary to renal cell carcinoma  . Colonoscopy N/A 04/21/2014    Procedure: COLONOSCOPY;  Surgeon: Rogene Houston, MD;  Location: [facility];  Service: Endoscopy;  Laterality: N/A;  . Colonoscopy N/A 04/21/2014    Procedure: COLONOSCOPY;  Surgeon: Rogene Houston, MD;  Location: [facility];  Service: Endoscopy;  Laterality: N/A;  1030  . Tee without cardioversion N/A 09/23/2014    Procedure: TRANSESOPHAGEAL ECHOCARDIOGRAM (TEE);  Surgeon: Sanda Klein, MD;  Location: [facility] END[facility]COPY;  Service: Cardiovascular;  Laterality: N/A;   Family History  Problem Relation Age of Onset  . Heart disease Mother 1  . Hypertension Mother   . Colon cancer Father   . Hypertension Sister     x2  . Diabetes Sister     x2  . Heart  failure Sister   . Lupus Brother    History  Substance Use Topics  . Smoking status: Never Smoker   . Smokeless tobacco: Never Used  . Alcohol Use: No   OB History    No data available     Review of Systems  All other systems reviewed and are negative.     Allergies  Morphine  Home Medications   Prior to Admission medications   Medication Sig Start Date End Date Taking? Authorizing Provider  acetaminophen (TYLENOL) 650 MG CR tablet 650 mg by Feeding Tube route every 4 (four) hours as needed for pain.    Historical Provider, MD  albuterol (PROVENTIL) (2.5 MG/3ML) 0.083% nebulizer solution Take 3 mLs (2.5 mg total) by nebulization every 4 (four) hours as needed for wheezing or shortness of breath. 10/01/14   Ripudeep Krystal Eaton, MD  Amino Acids-Protein Hydrolys (FEEDING SUPPLEMENT, PRO-STAT SUGAR FREE 64,) LIQD Take 30 mLs by mouth daily. 10/01/14   Ripudeep Krystal Eaton, MD  amLODipine (NORVASC) 10 MG tablet Place 1 tablet (10 mg total) into feeding tube every morning. 10/01/14   Ripudeep Krystal Eaton, MD  aspirin 325 MG tablet Place 1 tablet (325 mg total) into feeding tube daily. 10/01/14   Ripudeep Krystal Eaton, MD  budesonide-formoterol (SYMBICORT) 160-4.5 MCG/ACT inhaler Inhale 2 puffs into the lungs daily as needed (Shortness Of Breath).  Historical Provider, MD  calcitRIOL (ROCALTROL) 0.25 MCG capsule 0.25 mcg every Monday, Wednesday, and Friday.     Historical Provider, MD  carvedilol (COREG) 6.25 MG tablet Place 1 tablet (6.25 mg total) into feeding tube 2 (two) times daily with a meal. 10/01/14   Ripudeep K Rai, MD  Cholecalciferol (VITAMIN D3) 1000 UNITS CAPS 2 capsules by Feeding Tube route daily.    Historical Provider, MD  cloNIDine (CATAPRES - DOSED IN MG/24 HR) 0.3 mg/24hr patch Place 1 patch (0.3 mg total) onto the skin once a week. 10/01/14   Ripudeep Krystal Eaton, MD  fluticasone (FLONASE) 50 MCG/ACT nasal spray Place 2 sprays into both nostrils daily. 08/26/14   Farrel Gobble, MD    gabapentin (NEURONTIN) 300 MG capsule Take 1 capsule (300 mg total) by mouth 3 (three) times daily as needed. 11/02/14   Philmore Pali, NP  hydrALAZINE (APRESOLINE) 50 MG tablet Place 1 tablet (50 mg total) into feeding tube 3 (three) times daily. 10/01/14   Ripudeep Krystal Eaton, MD  levothyroxine (SYNTHROID, LEVOTHROID) 100 MCG tablet Place 1 tablet (100 mcg total) into feeding tube every Monday, Wednesday, and Friday. 10/01/14   Ripudeep Krystal Eaton, MD  levothyroxine (SYNTHROID, LEVOTHROID) 50 MCG tablet Place 1 tablet (50 mcg total) into feeding tube every Tuesday, Thursday, Saturday, and Sunday. 10/01/14   Ripudeep Krystal Eaton, MD  lovastatin (MEVACOR) 40 MG tablet Place 1 tablet (40 mg total) into feeding tube at bedtime. 10/01/14   Ripudeep Krystal Eaton, MD  meclizine (ANTIVERT) 25 MG tablet Take 25 mg by mouth 3 (three) times daily as needed for dizziness.    Historical Provider, MD  Multiple Vitamin (MULTIVITAMIN WITH MINERALS) TABS tablet Take 1 tablet by mouth daily.    Historical Provider, MD  Multiple Vitamins-Minerals (THERAGRAN-M PO) Take 1 tablet by mouth daily.    Historical Provider, MD  Nutritional Supplements (FEEDING SUPPLEMENT, GLUCERNA 1.2 CAL,) LIQD Place 1,000 mLs into feeding tube continuous. At 55cc/hr 10/01/14   Ripudeep Krystal Eaton, MD  nystatin (MYCOSTATIN) 100000 UNIT/ML suspension Take 5 mLs by mouth 3 (three) times daily.    Historical Provider, MD  pantoprazole sodium (PROTONIX) 40 mg/20 mL PACK Place 20 mLs (40 mg total) into feeding tube 2 (two) times daily. 10/01/14   Ripudeep Krystal Eaton, MD  prochlorperazine (COMPAZINE) 10 MG tablet Place 1 tablet (10 mg total) into feeding tube every 6 (six) hours as needed for nausea or vomiting. 10/01/14   Ripudeep Krystal Eaton, MD  Water For Irrigation, Sterile (FREE WATER) SOLN Place 100 mLs into feeding tube every 4 (four) hours. 10/01/14   Ripudeep K Rai, MD   BP 173/87 mmHg  Pulse 82  Temp(Src) 98.7 F (37.1 C) (Oral)  Resp 14  Ht 5\' 5"  (1.651 m)  Wt 240 lb  (108.863 kg)  BMI 39.94 kg/m2  SpO2 100% Physical Exam  Constitutional: She is oriented to person, place, and time. She appears well-developed and well-nourished.  Non-toxic appearance. No distress.  HENT:  Head: Normocephalic and atraumatic.  Eyes: Conjunctivae, EOM and lids are normal. Pupils are equal, round, and reactive to light.  Neck: Normal range of motion. Neck supple. No tracheal deviation present. No thyroid mass present.  Cardiovascular: Normal rate, regular rhythm and normal heart sounds.  Exam reveals no gallop.   No murmur heard. Pulmonary/Chest: Effort normal and breath sounds normal. No stridor. No respiratory distress. She has no decreased breath sounds. She has no wheezes. She has no rhonchi. She has no rales.  Abdominal: Soft. Normal appearance and bowel sounds are normal. She exhibits no distension. There is no tenderness. There is no rebound and no CVA tenderness.    Musculoskeletal: Normal range of motion. She exhibits no edema or tenderness.  Neurological: She is alert and oriented to person, place, and time. No cranial nerve deficit. GCS eye subscore is 4. GCS verbal subscore is 5. GCS motor subscore is 6.  At baseline per daughter  Skin: Skin is warm and dry. No abrasion and no rash noted.  Nursing note and vitals reviewed.   ED Course  Procedures (including critical care time) Labs Review Labs Reviewed - No data to display  Imaging Review No results found.   EKG Interpretation None      MDM   Final diagnoses:  Abdominal pain    Placement of patient's G-tube confirmed by contrast study read by radiology. Nursing flushed the patient's tube andl. No signs of extravasation. Stable for discharge  2:38 PM Patient had acute onset of left-sided flank pain suspicious for kidney stone. Urine and renal CT were negative. She is resting comfortably at this time. Suspect muscle spasm.  Leota Jacobsen, MD 11/06/14 [place], MD 11/06/14  [phone]

## 2014-11-06 NOTE — ED Notes (Signed)
Per EMS: from [facility], here because nurse was unable to aspirate g-tube and verify placement this am.  Patient has not had morning meds, was found to be hypertensive 210/100 by ems this am but is 173/87 here.  Denies complaints, here to verify placement of g-tube.

## 2014-11-06 NOTE — Discharge Instructions (Signed)
The placement of the patient's G-tube has been confirmed by the radiologist. It is safe to be used

## 2014-11-06 NOTE — ED Notes (Signed)
Remains in ct

## 2014-11-06 NOTE — ED Notes (Signed)
CT notified that pt is ready for transport 

## 2014-11-07 NOTE — Progress Notes (Signed)
I agree with the above plan 

## 2014-11-08 LAB — URINE CULTURE
COLONY COUNT: NO GROWTH
Culture: NO GROWTH

## 2014-11-10 ENCOUNTER — Ambulatory Visit (HOSPITAL_BASED_OUTPATIENT_CLINIC_OR_DEPARTMENT_OTHER): Payer: Medicare Other | Admitting: Hematology and Oncology

## 2014-11-10 ENCOUNTER — Encounter: Payer: Self-pay | Admitting: Hematology and Oncology

## 2014-11-10 ENCOUNTER — Telehealth: Payer: Self-pay | Admitting: Hematology and Oncology

## 2014-11-10 ENCOUNTER — Ambulatory Visit: Payer: Medicare Other

## 2014-11-10 VITALS — BP 141/84 | HR 73 | Temp 98.7°F | Resp 18 | Ht 65.0 in

## 2014-11-10 DIAGNOSIS — I69921 Dysphasia following unspecified cerebrovascular disease: Secondary | ICD-10-CM

## 2014-11-10 DIAGNOSIS — I69391 Dysphagia following cerebral infarction: Secondary | ICD-10-CM

## 2014-11-10 DIAGNOSIS — C9000 Multiple myeloma not having achieved remission: Secondary | ICD-10-CM

## 2014-11-10 DIAGNOSIS — Z931 Gastrostomy status: Secondary | ICD-10-CM | POA: Insufficient documentation

## 2014-11-10 DIAGNOSIS — I639 Cerebral infarction, unspecified: Secondary | ICD-10-CM

## 2014-11-10 NOTE — Assessment & Plan Note (Signed)
Her feeding tube looks fine without any signs of infection.

## 2014-11-10 NOTE — Assessment & Plan Note (Signed)
She is on pureed diet but according to her daughters she is doing better. I recommend reassess swallowing in the near future at the nursing home and to advance diet as tolerated depending on outcome of her repeat swallow evaluation.

## 2014-11-10 NOTE — Telephone Encounter (Signed)
lvm for pt regarding to May 2016 appt....mailed pt appt sched/avs and letter

## 2014-11-10 NOTE — Progress Notes (Signed)
[facility] CONSULT NOTE  Patient Care Team: Fayrene Helper, MD as PCP - General Yehuda Savannah, MD (Cardiology) Harriett Sine, MD as Consulting Physician (Nephrology) Rogene Houston, MD as Consulting Physician (Gastroenterology)  CHIEF COMPLAINTS/PURPOSE OF CONSULTATION:  Multiple myeloma, recent stroke  HISTORY OF PRESENTING ILLNESS:  Melissa [name] 68 y.o. female is here because of transfer of care from another institution due to recent significant debility after she suffered from acute stroke. I reviewed her records extensively. This patient had background history of right nephrectomy from renal cell carcinoma. She also was follow at another institution for MGUS. In August 2015, she had bone marrow aspirate and biopsy which confirmed the diagnosis of multiple myeloma. Accession: [id number] confirmed 20% plasma cell involvement. She has IgG kappa multiple myeloma. She was commenced on treatment with Velcade, Revlimid and dexamethasone. The patient was admitted to the hospital from 09/18/2014 to 10/01/2014 after suffering from significant acute stroke involving the left MCA territory causing aphasia, dysphagia, aspiration pneumonia, complicated by acute on chronic kidney disease and elevated troponin level. She was subsequently discharged to skilled nursing facility. I am not able to obtain any history from the patient. She is grossly sedated with Ativan was given prior to transportation. According to her daughter, she was moved from skin nursing portion of the facility to a long-term part of the facility due to lack of progress and inability to participate in rehabilitation efforts. She is on pureed diet and is dependent on feeding tube. Her performance status is poor with her spending more than half the time in bed.  MEDICAL HISTORY:  Past Medical History  Diagnosis Date  . Vertigo, intermittent   . Hypothyroidism   . Hyperlipidemia   . Obesity   .  Hypertension     severe and resistant to treatment; 2008-negative left renal angiogram  . Vitiligo   . Degenerative disc disease, lumbar   . Degenerative disc disease, cervical   . Chest pain 2008    2008-normal coronary angiography  . Renal cell carcinoma     Right nephrectomy    SURGICAL HISTORY: Past Surgical History  Procedure Laterality Date  . Tubal ligation  1983    Bilateral  . Nephrectomy      Right; secondary to renal cell carcinoma  . Colonoscopy N/A 04/21/2014    Procedure: COLONOSCOPY;  Surgeon: Rogene Houston, MD;  Location: [facility];  Service: Endoscopy;  Laterality: N/A;  . Colonoscopy N/A 04/21/2014    Procedure: COLONOSCOPY;  Surgeon: Rogene Houston, MD;  Location: [facility];  Service: Endoscopy;  Laterality: N/A;  1030  . Tee without cardioversion N/A 09/23/2014    Procedure: TRANSESOPHAGEAL ECHOCARDIOGRAM (TEE);  Surgeon: Sanda Klein, MD;  Location: [facility] ENDOSCOPY;  Service: Cardiovascular;  Laterality: N/A;    SOCIAL HISTORY: History   Social History  . Marital Status: Divorced    Spouse Name: N/A    Number of Children: 2  . Years of Education: N/A   Occupational History  . Disabled    Social History Main Topics  . Smoking status: Never Smoker   . Smokeless tobacco: Never Used  . Alcohol Use: No  . Drug Use: No  . Sexual Activity: Yes    Birth Control/ Protection: None   Other Topics Concern  . Not on file   Social History Narrative   Three grandchildren.   Patient is left handed.   Patient is disabled.       FAMILY  HISTORY: Family History  Problem Relation Age of Onset  . Heart disease Mother 90  . Hypertension Mother   . Colon cancer Father   . Hypertension Sister     x2  . Diabetes Sister     x2  . Heart failure Sister   . Lupus Brother     ALLERGIES:  is allergic to morphine.  MEDICATIONS:  Current Outpatient Prescriptions  Medication Sig Dispense Refill  . acetaminophen (TYLENOL) 650 MG CR tablet 650 mg by  Feeding Tube route every 4 (four) hours as needed for pain.    Marland Kitchen albuterol (PROVENTIL) (2.5 MG/3ML) 0.083% nebulizer solution Take 3 mLs (2.5 mg total) by nebulization every 4 (four) hours as needed for wheezing or shortness of breath. 75 mL 12  . Amino Acids-Protein Hydrolys (FEEDING SUPPLEMENT, PRO-STAT SUGAR FREE 64,) LIQD Take 30 mLs by mouth daily. (Patient taking differently: Take 30 mLs by mouth 3 (three) times daily. ) 900 mL 0  . amLODipine (NORVASC) 10 MG tablet Place 1 tablet (10 mg total) into feeding tube every morning.    Marland Kitchen aspirin 325 MG tablet Place 1 tablet (325 mg total) into feeding tube daily.    . budesonide-formoterol (SYMBICORT) 160-4.5 MCG/ACT inhaler Inhale 2 puffs into the lungs daily as needed (Shortness Of Breath).     . calcitRIOL (ROCALTROL) 0.25 MCG capsule 0.25 mcg every Monday, Wednesday, and Friday.     . carvedilol (COREG) 6.25 MG tablet Place 1 tablet (6.25 mg total) into feeding tube 2 (two) times daily with a meal.    . Cholecalciferol (VITAMIN D3) 1000 UNITS CAPS 2 capsules by Feeding Tube route daily.    . cloNIDine (CATAPRES - DOSED IN MG/24 HR) 0.3 mg/24hr patch Place 1 patch (0.3 mg total) onto the skin once a week. 4 patch 12  . fluticasone (FLONASE) 50 MCG/ACT nasal spray Place 2 sprays into both nostrils daily. 16 g 6  . gabapentin (NEURONTIN) 300 MG capsule Take 1 capsule (300 mg total) by mouth 3 (three) times daily as needed. (Patient taking differently: Place 300 mg into feeding tube 3 (three) times daily as needed. ) 90 capsule 11  . hydrALAZINE (APRESOLINE) 50 MG tablet Place 1 tablet (50 mg total) into feeding tube 3 (three) times daily.    Marland Kitchen levothyroxine (SYNTHROID, LEVOTHROID) 100 MCG tablet Place 1 tablet (100 mcg total) into feeding tube every Monday, Wednesday, and Friday.    . levothyroxine (SYNTHROID, LEVOTHROID) 50 MCG tablet Place 1 tablet (50 mcg total) into feeding tube every Tuesday, Thursday, Saturday, and Sunday.    Marland Kitchen LORazepam  (ATIVAN) 0.5 MG tablet     . LORazepam (ATIVAN) 2 MG/ML injection     . lovastatin (MEVACOR) 40 MG tablet Place 1 tablet (40 mg total) into feeding tube at bedtime.    . meclizine (ANTIVERT) 25 MG tablet Place 25 mg into feeding tube 3 (three) times daily as needed for dizziness.     . Multiple Vitamins-Minerals (THERAGRAN-M PO) Take 1 tablet by mouth daily.    . Nutritional Supplements (FEEDING SUPPLEMENT, GLUCERNA 1.2 CAL,) LIQD Place 1,000 mLs into feeding tube continuous. At 55cc/hr    . nystatin (MYCOSTATIN) 100000 UNIT/ML suspension Take 5 mLs by mouth 3 (three) times daily. Swab tongue and mucous membranes    . pantoprazole sodium (PROTONIX) 40 mg/20 mL PACK Place 20 mLs (40 mg total) into feeding tube 2 (two) times daily. 30 each   . prochlorperazine (COMPAZINE) 10 MG tablet Place 1 tablet (10  mg total) into feeding tube every 6 (six) hours as needed for nausea or vomiting. 60 tablet 1  . Water For Irrigation, Sterile (FREE WATER) SOLN Place 100 mLs into feeding tube every 4 (four) hours.     No current facility-administered medications for this visit.    REVIEW OF SYSTEMS:  Unable to obtain due to gross sedation.  PHYSICAL EXAMINATION: ECOG PERFORMANCE STATUS: 3 - Symptomatic, >50% confined to bed  Filed Vitals:   11/10/14 1123  BP: 141/84  Pulse: 73  Temp: 98.7 F (37.1 C)  Resp: 18   Filed Weights    GENERAL: Grossly sedated. She is morbidly obese. SKIN: skin color, texture, turgor are normal, no rashes or significant lesions EYES: normal, conjunctiva are pale and non-injected, sclera clear OROPHARYNX:no exudate, no erythema and lips, buccal mucosa, and tongue normal . Noted dry mucous membrane. NECK: supple, thyroid normal size, non-tender, without nodularity LYMPH:  no palpable lymphadenopathy in the cervical, axillary or inguinal LUNGS: clear to auscultation and percussion with normal breathing effort HEART: regular rate & rhythm and no murmurs and no lower  extremity edema ABDOMEN:abdomen soft, non-tender and normal bowel sounds. Feeding tube looks okay Musculoskeletal:no cyanosis of digits and no clubbing  PSYCH: Unable to assess as she is sedated. NEURO: Unable to assess.  LABORATORY DATA:  I have reviewed the data as listed Lab Results  Component Value Date   WBC 8.3 09/27/2014   HGB 10.8* 09/27/2014   HCT 31.2* 09/27/2014   MCV 89.1 09/27/2014   PLT 257 09/27/2014    Recent Labs  09/29/14 0815 09/30/14 0704 10/01/14 0348  NA 141 141 142  K 4.4 4.1 4.1  CL 108 108 107  CO2 _0 GLUCOSE 166* 111* 132*  BUN _1 CREATININE 1.07 1.24* 1.25*  CALCIUM 8.7 8.7 8.9  GFRNONAA 52* 44* 43*  GFRAA 60* 51* 50*  PROT 5.2* 5.3* 5.2*  ALBUMIN 2.2* 2.2* 2.1*  AST _2 ALT 32 27 27  ALKPHOS 46 44 49  BILITOT 0.4 0.4 0.3    RADIOGRAPHIC STUDIES: I have personally reviewed the radiological images as listed and agreed with the findings in the report. Ct Abdomen Pelvis Wo Contrast  11/06/2014   CLINICAL DATA:  Evaluate gastrostomy tube position. Dislodged gastric tube. Initial encounter. Diffuse abdominal pain.  EXAM: CT ABDOMEN AND PELVIS WITHOUT CONTRAST  TECHNIQUE: Multidetector CT imaging of the abdomen and pelvis was performed following the standard protocol without IV contrast.  COMPARISON:  11/06/2014.  FINDINGS: Musculoskeletal: No aggressive osseous lesions. Severe lumbar spondylosis. Thoracic spine DISH.  Lung Bases: Basilar pulmonary fibrosis. Coronary artery atherosclerosis is present. If office based assessment of coronary risk factors has not been performed, it is now recommended.  Liver: Unenhanced CT was performed per clinician order. Lack of IV contrast limits sensitivity and specificity, especially for evaluation of abdominal/pelvic solid viscera. Grossly normal.  Spleen:  Normal.  Gallbladder:  Normal.  No calcified stones.  Common bile duct:  Normal.  Pancreas:  Normal.  Adrenal glands:  Normal bilaterally.   Kidneys: Solitary LEFT kidney. LEFT ureter appears normal. No calculi.  Stomach: Collapsed. Gastric tube in good position. Contrast injected this morning has passed distally. No intra-abdominal free air along the gastrostomy tract. The tract in the subcutaneous tissues appears normal.  Small bowel:  Normal.  Colon: Normal appendix. Contrast opacifies the colon. No colonic inflammatory changes. Colonic diverticulosis without diverticulitis.  Pelvic Genitourinary:  Normal.  Vasculature: No acute  abnormality.  Atherosclerosis.  Body Wall: Mild soft tissue stranding along the RIGHT flank which may represent scarring or edema.  IMPRESSION: 1. Uncomplicated gastrostomy tube in good position. 2. RIGHT nephrectomy.  Solitary LEFT kidney. 3. Basilar pulmonary fibrosis.   Electronically Signed   By: Dereck Ligas M.D.   On: 11/06/2014 14:28   Dg Abd 1 View  11/06/2014   CLINICAL DATA:  Check G-tube placement with contrast.  EXAM: ABDOMEN - 1 VIEW  COMPARISON:  None.  FINDINGS: Oral contrast was injected via the patient's gastrostomy tube. Contrast outlines the stomach contour and gastric rugae, and is seen in the proximal duodenum. No contrast is seen external to the stomach or small bowel. The bowel gas pattern is nonobstructive. Probable bronchiectasis at the lung bases. There is some sclerosis about the right sacroiliac joint.  IMPRESSION:  Gastrostomy confirmed to be within the stomach, following contrast injection.   Electronically Signed   By: Curlene Dolphin M.D.   On: 11/06/2014 09:30    ASSESSMENT & PLAN:  Multiple myeloma without remission I do not plan to recheck her myeloma protein panel today. Her estimated performance status score is 3-4 and is not a candidate for systemic treatment. I recommend we focus on aggressive supportive care with the hope of trying to get her back on track with rehabilitation efforts after her recent stroke.  I explained the rationale to her daughters the reasons we are not  pursuing further systemic treatment and they agreed.  CVA (cerebral infarction) Clinically, she appears to have dense stroke with right hemiparesis. The patient is is excessively sedated with lorazepam prior to coming to the clinic for visit. I recommend we focus on rehabilitation effort. The goal would be to try to get her eat better and to get rid of the feeding tube in the new future.  Dysphagia due to recent stroke She is on pureed diet but according to her daughters she is doing better. I recommend reassess swallowing in the near future at the nursing home and to advance diet as tolerated depending on outcome of her repeat swallow evaluation.  S/P gastrostomy Her feeding tube looks fine without any signs of infection.    All questions were answered. The patient knows to call the clinic with any problems, questions or concerns. I spent 55 minutes counseling the patient face to face. The total time spent in the appointment was 60 minutes and more than 50% was on counseling.     [facility], [place], MD 11/10/2014 3:02 PM

## 2014-11-10 NOTE — Assessment & Plan Note (Signed)
Clinically, she appears to have dense stroke with right hemiparesis. The patient is is excessively sedated with lorazepam prior to coming to the clinic for visit. I recommend we focus on rehabilitation effort. The goal would be to try to get her eat better and to get rid of the feeding tube in the new future.

## 2014-11-10 NOTE — Assessment & Plan Note (Signed)
I do not plan to recheck her myeloma protein panel today. Her estimated performance status score is 3-4 and is not a candidate for systemic treatment. I recommend we focus on aggressive supportive care with the hope of trying to get her back on track with rehabilitation efforts after her recent stroke.  I explained the rationale to her daughters the reasons we are not pursuing further systemic treatment and they agreed.

## 2014-11-21 ENCOUNTER — Telehealth: Payer: Self-pay

## 2014-11-21 NOTE — Telephone Encounter (Signed)
Need to send for d/c papers from the h/h facility can be signed if  i see them

## 2014-11-22 NOTE — Telephone Encounter (Signed)
Called and requested that discharge summary be faxed.

## 2014-11-23 ENCOUNTER — Telehealth: Payer: Self-pay | Admitting: Hematology and Oncology

## 2014-11-23 NOTE — Telephone Encounter (Signed)
s.w pt daughter and confirmed that i needed to cx appt....pt is no longer in [facility] facility and is now back in [place] with Dr. Ellis Parents.

## 2014-11-25 ENCOUNTER — Ambulatory Visit: Payer: Medicare Other | Admitting: Hematology and Oncology

## 2014-11-29 ENCOUNTER — Encounter: Payer: Self-pay | Admitting: Hematology and Oncology

## 2014-11-29 ENCOUNTER — Telehealth: Payer: Self-pay | Admitting: *Deleted

## 2014-11-29 NOTE — Progress Notes (Signed)
Put daughter's fmla form on nurse's desk. °

## 2014-11-29 NOTE — Telephone Encounter (Signed)
Received faxed FMLA paper from daughter.  Gave forms to [place], care management.

## 2014-12-05 ENCOUNTER — Encounter: Payer: Medicare Other | Admitting: Family Medicine

## 2014-12-05 ENCOUNTER — Encounter: Payer: Self-pay | Admitting: Hematology and Oncology

## 2014-12-05 NOTE — Progress Notes (Signed)
Faxed daughter's fmla form to [phone]

## 2014-12-08 ENCOUNTER — Ambulatory Visit (INDEPENDENT_AMBULATORY_CARE_PROVIDER_SITE_OTHER): Payer: Medicare Other | Admitting: Otolaryngology

## 2014-12-12 ENCOUNTER — Encounter: Payer: Self-pay | Admitting: Family Medicine

## 2014-12-12 ENCOUNTER — Ambulatory Visit (INDEPENDENT_AMBULATORY_CARE_PROVIDER_SITE_OTHER): Payer: Medicare Other | Admitting: Family Medicine

## 2014-12-12 VITALS — BP 132/66 | HR 52 | Resp 18 | Ht 65.0 in

## 2014-12-12 DIAGNOSIS — F32A Depression, unspecified: Secondary | ICD-10-CM

## 2014-12-12 DIAGNOSIS — M5410 Radiculopathy, site unspecified: Secondary | ICD-10-CM

## 2014-12-12 DIAGNOSIS — F329 Major depressive disorder, single episode, unspecified: Secondary | ICD-10-CM

## 2014-12-12 DIAGNOSIS — I639 Cerebral infarction, unspecified: Secondary | ICD-10-CM

## 2014-12-12 DIAGNOSIS — E559 Vitamin D deficiency, unspecified: Secondary | ICD-10-CM

## 2014-12-12 DIAGNOSIS — E785 Hyperlipidemia, unspecified: Secondary | ICD-10-CM

## 2014-12-12 DIAGNOSIS — E039 Hypothyroidism, unspecified: Secondary | ICD-10-CM

## 2014-12-12 DIAGNOSIS — I10 Essential (primary) hypertension: Secondary | ICD-10-CM

## 2014-12-12 MED ORDER — HYDROCODONE-ACETAMINOPHEN 5-325 MG PO TABS: 1.0000 | ORAL_TABLET | Freq: Two times a day (BID) | ORAL | Status: DC | PRN

## 2014-12-12 MED ORDER — FLUOXETINE HCL 10 MG PO TABS: 10.0000 mg | ORAL_TABLET | Freq: Every day | ORAL | Status: DC

## 2014-12-12 MED ORDER — GABAPENTIN 300 MG PO CAPS: 300.0000 mg | ORAL_CAPSULE | Freq: Two times a day (BID) | ORAL | Status: DC

## 2014-12-12 NOTE — Patient Instructions (Signed)
Annual wellness in 3 month, call if you need me before  New for pain is gabapentin $RemoveBefor'300mg'NNlPafPeFvcY$  twice daily, 12 hours apart  Hydrocodone 5/325 one twice daily  Daughters will sign pain contract on your behalf as discussed  New for depression is fluoxetine $RemoveBefor'10mg'xRIMjypZivEN$  one daily  Updated med last from office is what you use  Fasting cBC, lipid, cmp and EGFr, hBa1C and TSH and Vit D by home health agency nurse if not by Tuesday of this week, then next week Monday or Tuesday  Refills of medications needed will be done today, review with nurse

## 2014-12-19 ENCOUNTER — Telehealth: Payer: Self-pay | Admitting: Family Medicine

## 2014-12-19 ENCOUNTER — Other Ambulatory Visit: Payer: Self-pay

## 2014-12-19 MED ORDER — CLONIDINE HCL 0.3 MG/24HR TD PTWK: 0.3000 mg | MEDICATED_PATCH | TRANSDERMAL | Status: DC

## 2014-12-19 NOTE — Assessment & Plan Note (Signed)
Left hemiparesis and expressive aphasia, recently d/c from SNF, home PT/OT

## 2014-12-19 NOTE — Telephone Encounter (Signed)
Med refilled as requested

## 2014-12-19 NOTE — Assessment & Plan Note (Addendum)
Uncontrolled off medication, worse since double dose of severe illness this year, multiple myeloma and CVA which has confined pt to wheelchair and with expressive aphasia, resume fluoxetine

## 2014-12-19 NOTE — Assessment & Plan Note (Signed)
Updated lab needed at/ before next visit. Will treat based on lab when available

## 2014-12-19 NOTE — Assessment & Plan Note (Signed)
Uncontrolled and increased radiates down left leg. Increase gabapentin and take as scheduled, hydrocodone twice daily also. Pain contract discussed and signed, one of her 2 daughters will collect the script

## 2014-12-19 NOTE — Assessment & Plan Note (Signed)
Controlled, no change in medication  

## 2014-12-19 NOTE — Assessment & Plan Note (Signed)
Controlled, no change in medication Updated lab needed at/ before next visit.  

## 2014-12-19 NOTE — Progress Notes (Signed)
Subjective:    Patient ID: Melissa [name], female    DOB: 1946/03/14, 68 y.o.   MRN: [id number]  HPI Pt in for evaluation since her return home from prolonged hospitalization for CVA, she  is now wheelchair confined and unable to effectively communicate due to limitation in spech Cries and c/o increased back and left lower ext pain Seems depressed and has difficulty with feeds , not interested   Review of Systems    See HPI, history from daughters who care for her as unable to Denies recent fever or chills. Denies sinus pressure, nasal congestion, ear pain or sore throat. Denies chest congestion, productive cough or wheezing. Denies chest pains, palpitations and leg swelling Denies abdominal pain, nausea, vomiting,diarrhea or constipation.   Denies dysuria, frequency, hesitancy or incontinence. C/o increased and uncontrolled back pain radiating to left leg, ant help Denies headaches, seizures, numbness, or tingling. C/o depression and crying spells Denies skin break down or rash.     Objective:   Physical Exam BP 132/66 mmHg  Pulse 52  Resp 18  Ht 5\' 5"  (1.651 m)  SpO2 99% Patient alert  and in no cardiopulmonary distress.left hemiparesis and expressive aphasia Readily tearful and frustrate by her debility  HEENT: No facial asymmetry, EOMI,   oropharynx pink and moist.  Neck decreased ROM no JVD, no mass.  Chest: Clear to auscultation bilaterally.  CVS: S1, S2 no murmurs, no S3.Regular rate.  ABD: Soft non tender.   Ext: No edema  MS: decreased  ROM spine, shoulders, hips and knees.  Skin: Intact, no ulcerations or rash noted.  Psych: Good eye contact, tearful affect.  anxious and  depressed appearing.  CNS: CN 2-12 intact, Grade 2 power in left extremities        Assessment & Plan:  Radicular low back pain Uncontrolled and increased radiates down left leg. Increase gabapentin and take as scheduled, hydrocodone twice daily also. Pain contract  discussed and signed, one of her 2 daughters will collect the script  Depression Uncontrolled off medication, worse since double dose of severe illness this year, multiple myeloma and CVA which has confined pt to wheelchair and with expressive aphasia, resume fluoxetine  CVA (cerebral infarction) Left hemiparesis and expressive aphasia, recently d/c from SNF, home PT/OT  Hypothyroid Controlled, no change in medication Updated lab needed at/ before next visit.   Hypertension Controlled, no change in medication   Vitamin D deficiency Updated lab needed at/ before next visit. Will treat based on lab when available

## 2014-12-21 ENCOUNTER — Telehealth: Payer: Self-pay | Admitting: Family Medicine

## 2014-12-21 ENCOUNTER — Other Ambulatory Visit: Payer: Self-pay | Admitting: Family Medicine

## 2014-12-21 ENCOUNTER — Telehealth: Payer: Self-pay

## 2014-12-21 DIAGNOSIS — E039 Hypothyroidism, unspecified: Secondary | ICD-10-CM

## 2014-12-21 DIAGNOSIS — I69351 Hemiplegia and hemiparesis following cerebral infarction affecting right dominant side: Secondary | ICD-10-CM

## 2014-12-21 DIAGNOSIS — N3000 Acute cystitis without hematuria: Secondary | ICD-10-CM

## 2014-12-21 DIAGNOSIS — I63032 Cerebral infarction due to thrombosis of left carotid artery: Secondary | ICD-10-CM

## 2014-12-21 MED ORDER — CLONAZEPAM 0.5 MG PO TABS: ORAL_TABLET | ORAL | Status: DC

## 2014-12-21 MED ORDER — LEVOTHYROXINE SODIUM 75 MCG PO TABS: ORAL_TABLET | ORAL | Status: DC

## 2014-12-21 NOTE — Telephone Encounter (Signed)
Lab ordered and faxed to home health

## 2014-12-21 NOTE — Addendum Note (Signed)
Addended by: Denman George B on: 12/21/2014 05:04 PM   Modules accepted: Orders

## 2014-12-21 NOTE — Telephone Encounter (Signed)
Daughter spoke with provider.  Klonopin .25mg  tid sent to pharmacy along with change in synthroid dose.  Will contact [facility] to get urine specimen for UA with reflex culture.  Labs (iron and ferritin) to be added to recent labs.

## 2014-12-21 NOTE — Telephone Encounter (Signed)
Pls see tele msg on this pt entered in her daughter's chart, she needs rept TSH in 6 weeks pls as well as the other orders already discussed

## 2014-12-22 NOTE — Telephone Encounter (Signed)
Orders faxed to [facility]  [phone]

## 2014-12-24 DIAGNOSIS — I63032 Cerebral infarction due to thrombosis of left carotid artery: Secondary | ICD-10-CM | POA: Diagnosis not present

## 2014-12-28 DIAGNOSIS — I63032 Cerebral infarction due to thrombosis of left carotid artery: Secondary | ICD-10-CM | POA: Diagnosis not present

## 2014-12-28 DIAGNOSIS — I69351 Hemiplegia and hemiparesis following cerebral infarction affecting right dominant side: Secondary | ICD-10-CM | POA: Diagnosis not present

## 2014-12-29 ENCOUNTER — Telehealth: Payer: Self-pay | Admitting: Family Medicine

## 2014-12-29 DIAGNOSIS — I63032 Cerebral infarction due to thrombosis of left carotid artery: Secondary | ICD-10-CM | POA: Diagnosis not present

## 2014-12-29 DIAGNOSIS — I69351 Hemiplegia and hemiparesis following cerebral infarction affecting right dominant side: Secondary | ICD-10-CM | POA: Diagnosis not present

## 2014-12-29 NOTE — Telephone Encounter (Signed)
pls call pt and caregiver, urine seems infected , send in cipro 500 mg #6 and ensure c/s also requested pls

## 2014-12-30 ENCOUNTER — Other Ambulatory Visit: Payer: Self-pay

## 2014-12-30 MED ORDER — CIPROFLOXACIN HCL 500 MG PO TABS: 500.0000 mg | ORAL_TABLET | Freq: Two times a day (BID) | ORAL | Status: DC

## 2014-12-30 NOTE — Telephone Encounter (Signed)
[facility] aware and will relay the message since I cannot get either daughter on the phone and med sent

## 2014-12-30 NOTE — Telephone Encounter (Signed)
Called patient and left message for them to return call at the office   

## 2015-01-02 ENCOUNTER — Encounter: Payer: Self-pay | Admitting: Family Medicine

## 2015-01-03 DIAGNOSIS — I63032 Cerebral infarction due to thrombosis of left carotid artery: Secondary | ICD-10-CM | POA: Diagnosis not present

## 2015-01-03 DIAGNOSIS — I69351 Hemiplegia and hemiparesis following cerebral infarction affecting right dominant side: Secondary | ICD-10-CM | POA: Diagnosis not present

## 2015-01-04 DIAGNOSIS — I63032 Cerebral infarction due to thrombosis of left carotid artery: Secondary | ICD-10-CM | POA: Diagnosis not present

## 2015-01-04 DIAGNOSIS — I69351 Hemiplegia and hemiparesis following cerebral infarction affecting right dominant side: Secondary | ICD-10-CM | POA: Diagnosis not present

## 2015-01-05 DIAGNOSIS — I69351 Hemiplegia and hemiparesis following cerebral infarction affecting right dominant side: Secondary | ICD-10-CM | POA: Diagnosis not present

## 2015-01-05 DIAGNOSIS — I63032 Cerebral infarction due to thrombosis of left carotid artery: Secondary | ICD-10-CM | POA: Diagnosis not present

## 2015-01-06 ENCOUNTER — Other Ambulatory Visit: Payer: Self-pay

## 2015-01-06 MED ORDER — HYDROCODONE-ACETAMINOPHEN 5-325 MG PO TABS: 1.0000 | ORAL_TABLET | Freq: Two times a day (BID) | ORAL | Status: DC | PRN

## 2015-01-09 DIAGNOSIS — I69351 Hemiplegia and hemiparesis following cerebral infarction affecting right dominant side: Secondary | ICD-10-CM | POA: Diagnosis not present

## 2015-01-09 DIAGNOSIS — I63032 Cerebral infarction due to thrombosis of left carotid artery: Secondary | ICD-10-CM | POA: Diagnosis not present

## 2015-01-10 ENCOUNTER — Other Ambulatory Visit: Payer: Self-pay

## 2015-01-10 ENCOUNTER — Telehealth: Payer: Self-pay | Admitting: *Deleted

## 2015-01-10 MED ORDER — CARVEDILOL 6.25 MG PO TABS: 6.2500 mg | ORAL_TABLET | Freq: Two times a day (BID) | ORAL | Status: DC

## 2015-01-10 NOTE — Telephone Encounter (Signed)
Daughter aware that Coreg should be continued.  Form for Veteran's benefits mailed to home address.

## 2015-01-10 NOTE — Telephone Encounter (Signed)
Pt's daughter called requesting to speak with Dr. Moshe Cipro about one of pt medications coreg to be refilled, [name] had a question if Dr. Moshe Cipro wanted her to continue it. Please advise Denman George she is pt's care giver [phone]

## 2015-01-16 ENCOUNTER — Telehealth: Payer: Self-pay | Admitting: Family Medicine

## 2015-01-16 NOTE — Telephone Encounter (Signed)
Pls contact her caregiver, will need to change friom fluoxetine 10 mg daily to citalopram 10 mg daily due to formulary coverage, note left in your are, pls, send in script after you spk with them and change med list

## 2015-01-17 ENCOUNTER — Other Ambulatory Visit: Payer: Self-pay

## 2015-01-17 MED ORDER — CITALOPRAM HYDROBROMIDE 10 MG PO TABS: 10.0000 mg | ORAL_TABLET | Freq: Every day | ORAL | Status: DC

## 2015-01-17 NOTE — Telephone Encounter (Signed)
Daughter [name] aware and med sent to pharmacy

## 2015-01-19 DIAGNOSIS — I63032 Cerebral infarction due to thrombosis of left carotid artery: Secondary | ICD-10-CM | POA: Diagnosis not present

## 2015-01-19 DIAGNOSIS — I69351 Hemiplegia and hemiparesis following cerebral infarction affecting right dominant side: Secondary | ICD-10-CM | POA: Diagnosis not present

## 2015-01-21 DIAGNOSIS — I63032 Cerebral infarction due to thrombosis of left carotid artery: Secondary | ICD-10-CM | POA: Diagnosis not present

## 2015-01-21 DIAGNOSIS — I6900 Unspecified sequelae of nontraumatic subarachnoid hemorrhage: Secondary | ICD-10-CM | POA: Diagnosis not present

## 2015-01-24 DIAGNOSIS — I63032 Cerebral infarction due to thrombosis of left carotid artery: Secondary | ICD-10-CM | POA: Diagnosis not present

## 2015-01-26 ENCOUNTER — Telehealth: Payer: Self-pay

## 2015-01-26 NOTE — Telephone Encounter (Signed)
Yes, pls type letter of neccesity, I will sign

## 2015-01-26 NOTE — Telephone Encounter (Signed)
Letter composed.

## 2015-01-31 DIAGNOSIS — M6281 Muscle weakness (generalized): Secondary | ICD-10-CM | POA: Diagnosis not present

## 2015-01-31 DIAGNOSIS — I63032 Cerebral infarction due to thrombosis of left carotid artery: Secondary | ICD-10-CM | POA: Diagnosis not present

## 2015-01-31 DIAGNOSIS — R1311 Dysphagia, oral phase: Secondary | ICD-10-CM | POA: Diagnosis not present

## 2015-02-03 ENCOUNTER — Other Ambulatory Visit: Payer: Self-pay

## 2015-02-03 MED ORDER — HYDROCODONE-ACETAMINOPHEN 5-325 MG PO TABS: 1.0000 | ORAL_TABLET | Freq: Two times a day (BID) | ORAL | Status: DC | PRN

## 2015-02-06 ENCOUNTER — Ambulatory Visit: Payer: Medicare Other | Admitting: Neurology

## 2015-02-07 ENCOUNTER — Telehealth: Payer: Self-pay | Admitting: Family Medicine

## 2015-02-07 NOTE — Telephone Encounter (Signed)
Needs a referral to [facility] in [place]  for speech, physical, and occupational therapy and also her incontinence supplies as her diapers, bed pan, and wipes the fax number there is [phone]

## 2015-02-09 NOTE — Telephone Encounter (Signed)
Noted and referral placed

## 2015-02-10 ENCOUNTER — Telehealth: Payer: Self-pay

## 2015-02-10 NOTE — Telephone Encounter (Signed)
Daughter called and stated that patient must have messed with her feeding tube and pulled it because it was bleeding around the area. Looked irritated. Advised she take her to the ED to ensure tube was still in place. Patients daughter agreed

## 2015-02-14 DIAGNOSIS — F329 Major depressive disorder, single episode, unspecified: Secondary | ICD-10-CM | POA: Diagnosis not present

## 2015-02-14 DIAGNOSIS — M5410 Radiculopathy, site unspecified: Secondary | ICD-10-CM | POA: Diagnosis not present

## 2015-02-14 DIAGNOSIS — Z431 Encounter for attention to gastrostomy: Secondary | ICD-10-CM | POA: Diagnosis not present

## 2015-02-14 DIAGNOSIS — I10 Essential (primary) hypertension: Secondary | ICD-10-CM | POA: Diagnosis not present

## 2015-02-14 DIAGNOSIS — R1310 Dysphagia, unspecified: Secondary | ICD-10-CM | POA: Diagnosis not present

## 2015-02-14 DIAGNOSIS — E785 Hyperlipidemia, unspecified: Secondary | ICD-10-CM | POA: Diagnosis not present

## 2015-02-14 DIAGNOSIS — I69354 Hemiplegia and hemiparesis following cerebral infarction affecting left non-dominant side: Secondary | ICD-10-CM | POA: Diagnosis not present

## 2015-02-14 DIAGNOSIS — I69391 Dysphagia following cerebral infarction: Secondary | ICD-10-CM | POA: Diagnosis not present

## 2015-02-14 DIAGNOSIS — I69320 Aphasia following cerebral infarction: Secondary | ICD-10-CM | POA: Diagnosis not present

## 2015-02-14 DIAGNOSIS — E039 Hypothyroidism, unspecified: Secondary | ICD-10-CM | POA: Diagnosis not present

## 2015-02-16 DIAGNOSIS — I69391 Dysphagia following cerebral infarction: Secondary | ICD-10-CM | POA: Diagnosis not present

## 2015-02-16 DIAGNOSIS — M5410 Radiculopathy, site unspecified: Secondary | ICD-10-CM | POA: Diagnosis not present

## 2015-02-16 DIAGNOSIS — I69320 Aphasia following cerebral infarction: Secondary | ICD-10-CM | POA: Diagnosis not present

## 2015-02-16 DIAGNOSIS — F329 Major depressive disorder, single episode, unspecified: Secondary | ICD-10-CM | POA: Diagnosis not present

## 2015-02-16 DIAGNOSIS — E785 Hyperlipidemia, unspecified: Secondary | ICD-10-CM | POA: Diagnosis not present

## 2015-02-16 DIAGNOSIS — I10 Essential (primary) hypertension: Secondary | ICD-10-CM | POA: Diagnosis not present

## 2015-02-16 DIAGNOSIS — Z431 Encounter for attention to gastrostomy: Secondary | ICD-10-CM | POA: Diagnosis not present

## 2015-02-16 DIAGNOSIS — E039 Hypothyroidism, unspecified: Secondary | ICD-10-CM | POA: Diagnosis not present

## 2015-02-16 DIAGNOSIS — R1310 Dysphagia, unspecified: Secondary | ICD-10-CM | POA: Diagnosis not present

## 2015-02-16 DIAGNOSIS — I69354 Hemiplegia and hemiparesis following cerebral infarction affecting left non-dominant side: Secondary | ICD-10-CM | POA: Diagnosis not present

## 2015-02-20 DIAGNOSIS — I10 Essential (primary) hypertension: Secondary | ICD-10-CM | POA: Diagnosis not present

## 2015-02-20 DIAGNOSIS — E039 Hypothyroidism, unspecified: Secondary | ICD-10-CM | POA: Diagnosis not present

## 2015-02-20 DIAGNOSIS — I69354 Hemiplegia and hemiparesis following cerebral infarction affecting left non-dominant side: Secondary | ICD-10-CM | POA: Diagnosis not present

## 2015-02-20 DIAGNOSIS — I69391 Dysphagia following cerebral infarction: Secondary | ICD-10-CM | POA: Diagnosis not present

## 2015-02-20 DIAGNOSIS — I63032 Cerebral infarction due to thrombosis of left carotid artery: Secondary | ICD-10-CM | POA: Diagnosis not present

## 2015-02-20 DIAGNOSIS — Z431 Encounter for attention to gastrostomy: Secondary | ICD-10-CM | POA: Diagnosis not present

## 2015-02-20 DIAGNOSIS — E785 Hyperlipidemia, unspecified: Secondary | ICD-10-CM | POA: Diagnosis not present

## 2015-02-20 DIAGNOSIS — F329 Major depressive disorder, single episode, unspecified: Secondary | ICD-10-CM | POA: Diagnosis not present

## 2015-02-20 DIAGNOSIS — I6900 Unspecified sequelae of nontraumatic subarachnoid hemorrhage: Secondary | ICD-10-CM | POA: Diagnosis not present

## 2015-02-20 DIAGNOSIS — I69320 Aphasia following cerebral infarction: Secondary | ICD-10-CM | POA: Diagnosis not present

## 2015-02-20 DIAGNOSIS — M5410 Radiculopathy, site unspecified: Secondary | ICD-10-CM | POA: Diagnosis not present

## 2015-02-20 DIAGNOSIS — R1310 Dysphagia, unspecified: Secondary | ICD-10-CM | POA: Diagnosis not present

## 2015-02-21 DIAGNOSIS — I69354 Hemiplegia and hemiparesis following cerebral infarction affecting left non-dominant side: Secondary | ICD-10-CM | POA: Diagnosis not present

## 2015-02-21 DIAGNOSIS — I69320 Aphasia following cerebral infarction: Secondary | ICD-10-CM | POA: Diagnosis not present

## 2015-02-21 DIAGNOSIS — I10 Essential (primary) hypertension: Secondary | ICD-10-CM | POA: Diagnosis not present

## 2015-02-21 DIAGNOSIS — I69391 Dysphagia following cerebral infarction: Secondary | ICD-10-CM | POA: Diagnosis not present

## 2015-02-21 DIAGNOSIS — R1310 Dysphagia, unspecified: Secondary | ICD-10-CM | POA: Diagnosis not present

## 2015-02-21 DIAGNOSIS — E785 Hyperlipidemia, unspecified: Secondary | ICD-10-CM | POA: Diagnosis not present

## 2015-02-21 DIAGNOSIS — Z431 Encounter for attention to gastrostomy: Secondary | ICD-10-CM | POA: Diagnosis not present

## 2015-02-21 DIAGNOSIS — F329 Major depressive disorder, single episode, unspecified: Secondary | ICD-10-CM | POA: Diagnosis not present

## 2015-02-21 DIAGNOSIS — E039 Hypothyroidism, unspecified: Secondary | ICD-10-CM | POA: Diagnosis not present

## 2015-02-21 DIAGNOSIS — M5410 Radiculopathy, site unspecified: Secondary | ICD-10-CM | POA: Diagnosis not present

## 2015-02-22 DIAGNOSIS — I63032 Cerebral infarction due to thrombosis of left carotid artery: Secondary | ICD-10-CM | POA: Diagnosis not present

## 2015-02-23 DIAGNOSIS — E785 Hyperlipidemia, unspecified: Secondary | ICD-10-CM | POA: Diagnosis not present

## 2015-02-23 DIAGNOSIS — E039 Hypothyroidism, unspecified: Secondary | ICD-10-CM | POA: Diagnosis not present

## 2015-02-23 DIAGNOSIS — Z431 Encounter for attention to gastrostomy: Secondary | ICD-10-CM | POA: Diagnosis not present

## 2015-02-23 DIAGNOSIS — I69320 Aphasia following cerebral infarction: Secondary | ICD-10-CM | POA: Diagnosis not present

## 2015-02-23 DIAGNOSIS — I10 Essential (primary) hypertension: Secondary | ICD-10-CM | POA: Diagnosis not present

## 2015-02-23 DIAGNOSIS — M5410 Radiculopathy, site unspecified: Secondary | ICD-10-CM | POA: Diagnosis not present

## 2015-02-23 DIAGNOSIS — I69391 Dysphagia following cerebral infarction: Secondary | ICD-10-CM | POA: Diagnosis not present

## 2015-02-23 DIAGNOSIS — F329 Major depressive disorder, single episode, unspecified: Secondary | ICD-10-CM | POA: Diagnosis not present

## 2015-02-23 DIAGNOSIS — R1310 Dysphagia, unspecified: Secondary | ICD-10-CM | POA: Diagnosis not present

## 2015-02-23 DIAGNOSIS — I69354 Hemiplegia and hemiparesis following cerebral infarction affecting left non-dominant side: Secondary | ICD-10-CM | POA: Diagnosis not present

## 2015-02-24 ENCOUNTER — Other Ambulatory Visit: Payer: Self-pay | Admitting: Family Medicine

## 2015-02-24 DIAGNOSIS — I10 Essential (primary) hypertension: Secondary | ICD-10-CM | POA: Diagnosis not present

## 2015-02-24 DIAGNOSIS — F329 Major depressive disorder, single episode, unspecified: Secondary | ICD-10-CM | POA: Diagnosis not present

## 2015-02-24 DIAGNOSIS — Z431 Encounter for attention to gastrostomy: Secondary | ICD-10-CM | POA: Diagnosis not present

## 2015-02-24 DIAGNOSIS — I69391 Dysphagia following cerebral infarction: Secondary | ICD-10-CM | POA: Diagnosis not present

## 2015-02-24 DIAGNOSIS — I69354 Hemiplegia and hemiparesis following cerebral infarction affecting left non-dominant side: Secondary | ICD-10-CM | POA: Diagnosis not present

## 2015-02-24 DIAGNOSIS — E039 Hypothyroidism, unspecified: Secondary | ICD-10-CM | POA: Diagnosis not present

## 2015-02-24 DIAGNOSIS — R1310 Dysphagia, unspecified: Secondary | ICD-10-CM | POA: Diagnosis not present

## 2015-02-24 DIAGNOSIS — E785 Hyperlipidemia, unspecified: Secondary | ICD-10-CM | POA: Diagnosis not present

## 2015-02-24 DIAGNOSIS — M5410 Radiculopathy, site unspecified: Secondary | ICD-10-CM | POA: Diagnosis not present

## 2015-02-24 DIAGNOSIS — I69320 Aphasia following cerebral infarction: Secondary | ICD-10-CM | POA: Diagnosis not present

## 2015-02-27 DIAGNOSIS — I69320 Aphasia following cerebral infarction: Secondary | ICD-10-CM | POA: Diagnosis not present

## 2015-02-27 DIAGNOSIS — Z431 Encounter for attention to gastrostomy: Secondary | ICD-10-CM | POA: Diagnosis not present

## 2015-02-27 DIAGNOSIS — F329 Major depressive disorder, single episode, unspecified: Secondary | ICD-10-CM | POA: Diagnosis not present

## 2015-02-27 DIAGNOSIS — R1310 Dysphagia, unspecified: Secondary | ICD-10-CM | POA: Diagnosis not present

## 2015-02-27 DIAGNOSIS — E785 Hyperlipidemia, unspecified: Secondary | ICD-10-CM | POA: Diagnosis not present

## 2015-02-27 DIAGNOSIS — I69354 Hemiplegia and hemiparesis following cerebral infarction affecting left non-dominant side: Secondary | ICD-10-CM | POA: Diagnosis not present

## 2015-02-27 DIAGNOSIS — E039 Hypothyroidism, unspecified: Secondary | ICD-10-CM | POA: Diagnosis not present

## 2015-02-27 DIAGNOSIS — M5410 Radiculopathy, site unspecified: Secondary | ICD-10-CM | POA: Diagnosis not present

## 2015-02-27 DIAGNOSIS — I10 Essential (primary) hypertension: Secondary | ICD-10-CM | POA: Diagnosis not present

## 2015-02-27 DIAGNOSIS — I69391 Dysphagia following cerebral infarction: Secondary | ICD-10-CM | POA: Diagnosis not present

## 2015-02-28 DIAGNOSIS — E039 Hypothyroidism, unspecified: Secondary | ICD-10-CM | POA: Diagnosis not present

## 2015-02-28 DIAGNOSIS — I69354 Hemiplegia and hemiparesis following cerebral infarction affecting left non-dominant side: Secondary | ICD-10-CM | POA: Diagnosis not present

## 2015-02-28 DIAGNOSIS — I69320 Aphasia following cerebral infarction: Secondary | ICD-10-CM | POA: Diagnosis not present

## 2015-02-28 DIAGNOSIS — I69391 Dysphagia following cerebral infarction: Secondary | ICD-10-CM | POA: Diagnosis not present

## 2015-02-28 DIAGNOSIS — R1310 Dysphagia, unspecified: Secondary | ICD-10-CM | POA: Diagnosis not present

## 2015-02-28 DIAGNOSIS — E785 Hyperlipidemia, unspecified: Secondary | ICD-10-CM | POA: Diagnosis not present

## 2015-02-28 DIAGNOSIS — I10 Essential (primary) hypertension: Secondary | ICD-10-CM | POA: Diagnosis not present

## 2015-02-28 DIAGNOSIS — F329 Major depressive disorder, single episode, unspecified: Secondary | ICD-10-CM | POA: Diagnosis not present

## 2015-02-28 DIAGNOSIS — Z431 Encounter for attention to gastrostomy: Secondary | ICD-10-CM | POA: Diagnosis not present

## 2015-02-28 DIAGNOSIS — M5410 Radiculopathy, site unspecified: Secondary | ICD-10-CM | POA: Diagnosis not present

## 2015-03-01 ENCOUNTER — Telehealth: Payer: Self-pay

## 2015-03-01 ENCOUNTER — Telehealth: Payer: Self-pay | Admitting: Family Medicine

## 2015-03-01 DIAGNOSIS — R1310 Dysphagia, unspecified: Secondary | ICD-10-CM

## 2015-03-01 DIAGNOSIS — I69391 Dysphagia following cerebral infarction: Secondary | ICD-10-CM

## 2015-03-01 DIAGNOSIS — I69320 Aphasia following cerebral infarction: Secondary | ICD-10-CM

## 2015-03-01 DIAGNOSIS — I69354 Hemiplegia and hemiparesis following cerebral infarction affecting left non-dominant side: Secondary | ICD-10-CM

## 2015-03-01 NOTE — Telephone Encounter (Signed)
Ins coverage fluoxetine 10mg  is not preferred, let daughter/ pt know, will need to change  cirtalopram 10mg  one daily, pls send in the change aftwer they afare aware and d/c the fluoxetine, thanks

## 2015-03-01 NOTE — Telephone Encounter (Signed)
wants verbal order for social work to come in and assess. Having difficulty paying for all the medical expenses. Gave the verbal ok

## 2015-03-02 DIAGNOSIS — R1310 Dysphagia, unspecified: Secondary | ICD-10-CM | POA: Diagnosis not present

## 2015-03-02 DIAGNOSIS — I69391 Dysphagia following cerebral infarction: Secondary | ICD-10-CM | POA: Diagnosis not present

## 2015-03-02 DIAGNOSIS — I10 Essential (primary) hypertension: Secondary | ICD-10-CM | POA: Diagnosis not present

## 2015-03-02 DIAGNOSIS — E039 Hypothyroidism, unspecified: Secondary | ICD-10-CM | POA: Diagnosis not present

## 2015-03-02 DIAGNOSIS — F329 Major depressive disorder, single episode, unspecified: Secondary | ICD-10-CM | POA: Diagnosis not present

## 2015-03-02 DIAGNOSIS — I69320 Aphasia following cerebral infarction: Secondary | ICD-10-CM | POA: Diagnosis not present

## 2015-03-02 DIAGNOSIS — I69354 Hemiplegia and hemiparesis following cerebral infarction affecting left non-dominant side: Secondary | ICD-10-CM | POA: Diagnosis not present

## 2015-03-02 DIAGNOSIS — E785 Hyperlipidemia, unspecified: Secondary | ICD-10-CM | POA: Diagnosis not present

## 2015-03-02 DIAGNOSIS — Z431 Encounter for attention to gastrostomy: Secondary | ICD-10-CM | POA: Diagnosis not present

## 2015-03-02 DIAGNOSIS — M5410 Radiculopathy, site unspecified: Secondary | ICD-10-CM | POA: Diagnosis not present

## 2015-03-03 DIAGNOSIS — I69320 Aphasia following cerebral infarction: Secondary | ICD-10-CM | POA: Diagnosis not present

## 2015-03-03 DIAGNOSIS — Z431 Encounter for attention to gastrostomy: Secondary | ICD-10-CM | POA: Diagnosis not present

## 2015-03-03 DIAGNOSIS — M5410 Radiculopathy, site unspecified: Secondary | ICD-10-CM | POA: Diagnosis not present

## 2015-03-03 DIAGNOSIS — E785 Hyperlipidemia, unspecified: Secondary | ICD-10-CM | POA: Diagnosis not present

## 2015-03-03 DIAGNOSIS — E039 Hypothyroidism, unspecified: Secondary | ICD-10-CM | POA: Diagnosis not present

## 2015-03-03 DIAGNOSIS — I69391 Dysphagia following cerebral infarction: Secondary | ICD-10-CM | POA: Diagnosis not present

## 2015-03-03 DIAGNOSIS — I69354 Hemiplegia and hemiparesis following cerebral infarction affecting left non-dominant side: Secondary | ICD-10-CM | POA: Diagnosis not present

## 2015-03-03 DIAGNOSIS — M6281 Muscle weakness (generalized): Secondary | ICD-10-CM | POA: Diagnosis not present

## 2015-03-03 DIAGNOSIS — I10 Essential (primary) hypertension: Secondary | ICD-10-CM | POA: Diagnosis not present

## 2015-03-03 DIAGNOSIS — F329 Major depressive disorder, single episode, unspecified: Secondary | ICD-10-CM | POA: Diagnosis not present

## 2015-03-03 DIAGNOSIS — R1311 Dysphagia, oral phase: Secondary | ICD-10-CM | POA: Diagnosis not present

## 2015-03-03 DIAGNOSIS — I63032 Cerebral infarction due to thrombosis of left carotid artery: Secondary | ICD-10-CM | POA: Diagnosis not present

## 2015-03-03 DIAGNOSIS — R1310 Dysphagia, unspecified: Secondary | ICD-10-CM | POA: Diagnosis not present

## 2015-03-03 NOTE — Telephone Encounter (Signed)
Med sent and daughter Jonelle Sidle) aware

## 2015-03-06 DIAGNOSIS — E039 Hypothyroidism, unspecified: Secondary | ICD-10-CM | POA: Diagnosis not present

## 2015-03-06 DIAGNOSIS — R1310 Dysphagia, unspecified: Secondary | ICD-10-CM | POA: Diagnosis not present

## 2015-03-06 DIAGNOSIS — I10 Essential (primary) hypertension: Secondary | ICD-10-CM | POA: Diagnosis not present

## 2015-03-06 DIAGNOSIS — I69354 Hemiplegia and hemiparesis following cerebral infarction affecting left non-dominant side: Secondary | ICD-10-CM | POA: Diagnosis not present

## 2015-03-06 DIAGNOSIS — I69391 Dysphagia following cerebral infarction: Secondary | ICD-10-CM | POA: Diagnosis not present

## 2015-03-06 DIAGNOSIS — M5410 Radiculopathy, site unspecified: Secondary | ICD-10-CM | POA: Diagnosis not present

## 2015-03-06 DIAGNOSIS — F329 Major depressive disorder, single episode, unspecified: Secondary | ICD-10-CM | POA: Diagnosis not present

## 2015-03-06 DIAGNOSIS — Z431 Encounter for attention to gastrostomy: Secondary | ICD-10-CM | POA: Diagnosis not present

## 2015-03-06 DIAGNOSIS — E785 Hyperlipidemia, unspecified: Secondary | ICD-10-CM | POA: Diagnosis not present

## 2015-03-06 DIAGNOSIS — I69320 Aphasia following cerebral infarction: Secondary | ICD-10-CM | POA: Diagnosis not present

## 2015-03-07 DIAGNOSIS — E039 Hypothyroidism, unspecified: Secondary | ICD-10-CM | POA: Diagnosis not present

## 2015-03-07 DIAGNOSIS — R1310 Dysphagia, unspecified: Secondary | ICD-10-CM | POA: Diagnosis not present

## 2015-03-07 DIAGNOSIS — M5410 Radiculopathy, site unspecified: Secondary | ICD-10-CM | POA: Diagnosis not present

## 2015-03-07 DIAGNOSIS — I10 Essential (primary) hypertension: Secondary | ICD-10-CM | POA: Diagnosis not present

## 2015-03-07 DIAGNOSIS — E785 Hyperlipidemia, unspecified: Secondary | ICD-10-CM | POA: Diagnosis not present

## 2015-03-07 DIAGNOSIS — I69320 Aphasia following cerebral infarction: Secondary | ICD-10-CM | POA: Diagnosis not present

## 2015-03-07 DIAGNOSIS — F329 Major depressive disorder, single episode, unspecified: Secondary | ICD-10-CM | POA: Diagnosis not present

## 2015-03-07 DIAGNOSIS — I69354 Hemiplegia and hemiparesis following cerebral infarction affecting left non-dominant side: Secondary | ICD-10-CM | POA: Diagnosis not present

## 2015-03-07 DIAGNOSIS — Z431 Encounter for attention to gastrostomy: Secondary | ICD-10-CM | POA: Diagnosis not present

## 2015-03-07 DIAGNOSIS — I69391 Dysphagia following cerebral infarction: Secondary | ICD-10-CM | POA: Diagnosis not present

## 2015-03-08 ENCOUNTER — Other Ambulatory Visit: Payer: Self-pay | Admitting: Family Medicine

## 2015-03-09 DIAGNOSIS — I69354 Hemiplegia and hemiparesis following cerebral infarction affecting left non-dominant side: Secondary | ICD-10-CM | POA: Diagnosis not present

## 2015-03-09 DIAGNOSIS — Z431 Encounter for attention to gastrostomy: Secondary | ICD-10-CM | POA: Diagnosis not present

## 2015-03-09 DIAGNOSIS — E039 Hypothyroidism, unspecified: Secondary | ICD-10-CM | POA: Diagnosis not present

## 2015-03-09 DIAGNOSIS — E785 Hyperlipidemia, unspecified: Secondary | ICD-10-CM | POA: Diagnosis not present

## 2015-03-09 DIAGNOSIS — F329 Major depressive disorder, single episode, unspecified: Secondary | ICD-10-CM | POA: Diagnosis not present

## 2015-03-09 DIAGNOSIS — I69320 Aphasia following cerebral infarction: Secondary | ICD-10-CM | POA: Diagnosis not present

## 2015-03-09 DIAGNOSIS — I10 Essential (primary) hypertension: Secondary | ICD-10-CM | POA: Diagnosis not present

## 2015-03-09 DIAGNOSIS — I69391 Dysphagia following cerebral infarction: Secondary | ICD-10-CM | POA: Diagnosis not present

## 2015-03-09 DIAGNOSIS — R1310 Dysphagia, unspecified: Secondary | ICD-10-CM | POA: Diagnosis not present

## 2015-03-09 DIAGNOSIS — M5410 Radiculopathy, site unspecified: Secondary | ICD-10-CM | POA: Diagnosis not present

## 2015-03-10 DIAGNOSIS — I10 Essential (primary) hypertension: Secondary | ICD-10-CM | POA: Diagnosis not present

## 2015-03-10 DIAGNOSIS — I69354 Hemiplegia and hemiparesis following cerebral infarction affecting left non-dominant side: Secondary | ICD-10-CM | POA: Diagnosis not present

## 2015-03-10 DIAGNOSIS — I69391 Dysphagia following cerebral infarction: Secondary | ICD-10-CM | POA: Diagnosis not present

## 2015-03-10 DIAGNOSIS — E785 Hyperlipidemia, unspecified: Secondary | ICD-10-CM | POA: Diagnosis not present

## 2015-03-10 DIAGNOSIS — M5410 Radiculopathy, site unspecified: Secondary | ICD-10-CM | POA: Diagnosis not present

## 2015-03-10 DIAGNOSIS — R1310 Dysphagia, unspecified: Secondary | ICD-10-CM | POA: Diagnosis not present

## 2015-03-10 DIAGNOSIS — F329 Major depressive disorder, single episode, unspecified: Secondary | ICD-10-CM | POA: Diagnosis not present

## 2015-03-10 DIAGNOSIS — Z431 Encounter for attention to gastrostomy: Secondary | ICD-10-CM | POA: Diagnosis not present

## 2015-03-10 DIAGNOSIS — I69320 Aphasia following cerebral infarction: Secondary | ICD-10-CM | POA: Diagnosis not present

## 2015-03-10 DIAGNOSIS — E039 Hypothyroidism, unspecified: Secondary | ICD-10-CM | POA: Diagnosis not present

## 2015-03-13 ENCOUNTER — Other Ambulatory Visit: Payer: Self-pay

## 2015-03-13 DIAGNOSIS — Z431 Encounter for attention to gastrostomy: Secondary | ICD-10-CM | POA: Diagnosis not present

## 2015-03-13 DIAGNOSIS — E039 Hypothyroidism, unspecified: Secondary | ICD-10-CM | POA: Diagnosis not present

## 2015-03-13 DIAGNOSIS — I69320 Aphasia following cerebral infarction: Secondary | ICD-10-CM | POA: Diagnosis not present

## 2015-03-13 DIAGNOSIS — I69391 Dysphagia following cerebral infarction: Secondary | ICD-10-CM | POA: Diagnosis not present

## 2015-03-13 DIAGNOSIS — F329 Major depressive disorder, single episode, unspecified: Secondary | ICD-10-CM | POA: Diagnosis not present

## 2015-03-13 DIAGNOSIS — I69354 Hemiplegia and hemiparesis following cerebral infarction affecting left non-dominant side: Secondary | ICD-10-CM | POA: Diagnosis not present

## 2015-03-13 DIAGNOSIS — E785 Hyperlipidemia, unspecified: Secondary | ICD-10-CM | POA: Diagnosis not present

## 2015-03-13 DIAGNOSIS — M5410 Radiculopathy, site unspecified: Secondary | ICD-10-CM | POA: Diagnosis not present

## 2015-03-13 DIAGNOSIS — I10 Essential (primary) hypertension: Secondary | ICD-10-CM | POA: Diagnosis not present

## 2015-03-13 DIAGNOSIS — R1310 Dysphagia, unspecified: Secondary | ICD-10-CM | POA: Diagnosis not present

## 2015-03-13 MED ORDER — LOVASTATIN 40 MG PO TABS: 40.0000 mg | ORAL_TABLET | Freq: Every day | ORAL | Status: DC

## 2015-03-15 DIAGNOSIS — F329 Major depressive disorder, single episode, unspecified: Secondary | ICD-10-CM | POA: Diagnosis not present

## 2015-03-15 DIAGNOSIS — M5410 Radiculopathy, site unspecified: Secondary | ICD-10-CM | POA: Diagnosis not present

## 2015-03-15 DIAGNOSIS — Z431 Encounter for attention to gastrostomy: Secondary | ICD-10-CM | POA: Diagnosis not present

## 2015-03-15 DIAGNOSIS — I69320 Aphasia following cerebral infarction: Secondary | ICD-10-CM | POA: Diagnosis not present

## 2015-03-15 DIAGNOSIS — E039 Hypothyroidism, unspecified: Secondary | ICD-10-CM | POA: Diagnosis not present

## 2015-03-15 DIAGNOSIS — I69391 Dysphagia following cerebral infarction: Secondary | ICD-10-CM | POA: Diagnosis not present

## 2015-03-15 DIAGNOSIS — I69354 Hemiplegia and hemiparesis following cerebral infarction affecting left non-dominant side: Secondary | ICD-10-CM | POA: Diagnosis not present

## 2015-03-15 DIAGNOSIS — R1310 Dysphagia, unspecified: Secondary | ICD-10-CM | POA: Diagnosis not present

## 2015-03-15 DIAGNOSIS — E785 Hyperlipidemia, unspecified: Secondary | ICD-10-CM | POA: Diagnosis not present

## 2015-03-15 DIAGNOSIS — I10 Essential (primary) hypertension: Secondary | ICD-10-CM | POA: Diagnosis not present

## 2015-03-16 ENCOUNTER — Other Ambulatory Visit: Payer: Self-pay | Admitting: Family Medicine

## 2015-03-16 ENCOUNTER — Encounter: Payer: Medicare Other | Admitting: Family Medicine

## 2015-03-20 ENCOUNTER — Telehealth: Payer: Self-pay | Admitting: Family Medicine

## 2015-03-20 NOTE — Telephone Encounter (Signed)
Called and left message notifying that orders of ok

## 2015-03-21 DIAGNOSIS — R1310 Dysphagia, unspecified: Secondary | ICD-10-CM | POA: Diagnosis not present

## 2015-03-21 DIAGNOSIS — I69391 Dysphagia following cerebral infarction: Secondary | ICD-10-CM | POA: Diagnosis not present

## 2015-03-21 DIAGNOSIS — I69354 Hemiplegia and hemiparesis following cerebral infarction affecting left non-dominant side: Secondary | ICD-10-CM | POA: Diagnosis not present

## 2015-03-21 DIAGNOSIS — F329 Major depressive disorder, single episode, unspecified: Secondary | ICD-10-CM | POA: Diagnosis not present

## 2015-03-21 DIAGNOSIS — E039 Hypothyroidism, unspecified: Secondary | ICD-10-CM | POA: Diagnosis not present

## 2015-03-21 DIAGNOSIS — Z431 Encounter for attention to gastrostomy: Secondary | ICD-10-CM | POA: Diagnosis not present

## 2015-03-21 DIAGNOSIS — E785 Hyperlipidemia, unspecified: Secondary | ICD-10-CM | POA: Diagnosis not present

## 2015-03-21 DIAGNOSIS — I69320 Aphasia following cerebral infarction: Secondary | ICD-10-CM | POA: Diagnosis not present

## 2015-03-21 DIAGNOSIS — I10 Essential (primary) hypertension: Secondary | ICD-10-CM | POA: Diagnosis not present

## 2015-03-21 DIAGNOSIS — M5410 Radiculopathy, site unspecified: Secondary | ICD-10-CM | POA: Diagnosis not present

## 2015-03-22 DIAGNOSIS — I6900 Unspecified sequelae of nontraumatic subarachnoid hemorrhage: Secondary | ICD-10-CM | POA: Diagnosis not present

## 2015-03-22 DIAGNOSIS — I63032 Cerebral infarction due to thrombosis of left carotid artery: Secondary | ICD-10-CM | POA: Diagnosis not present

## 2015-03-25 DIAGNOSIS — I63032 Cerebral infarction due to thrombosis of left carotid artery: Secondary | ICD-10-CM | POA: Diagnosis not present

## 2015-03-28 ENCOUNTER — Other Ambulatory Visit: Payer: Self-pay

## 2015-03-28 MED ORDER — LEVOTHYROXINE SODIUM 75 MCG PO TABS: ORAL_TABLET | ORAL | Status: DC

## 2015-04-03 ENCOUNTER — Ambulatory Visit (INDEPENDENT_AMBULATORY_CARE_PROVIDER_SITE_OTHER): Payer: Medicare Other | Admitting: Family Medicine

## 2015-04-03 ENCOUNTER — Encounter: Payer: Self-pay | Admitting: Family Medicine

## 2015-04-03 VITALS — BP 144/80 | HR 60 | Resp 18

## 2015-04-03 DIAGNOSIS — E559 Vitamin D deficiency, unspecified: Secondary | ICD-10-CM | POA: Diagnosis not present

## 2015-04-03 DIAGNOSIS — I69391 Dysphagia following cerebral infarction: Secondary | ICD-10-CM

## 2015-04-03 DIAGNOSIS — E039 Hypothyroidism, unspecified: Secondary | ICD-10-CM

## 2015-04-03 DIAGNOSIS — R7301 Impaired fasting glucose: Secondary | ICD-10-CM

## 2015-04-03 DIAGNOSIS — M25511 Pain in right shoulder: Secondary | ICD-10-CM

## 2015-04-03 DIAGNOSIS — G8191 Hemiplegia, unspecified affecting right dominant side: Secondary | ICD-10-CM | POA: Diagnosis not present

## 2015-04-03 DIAGNOSIS — Z23 Encounter for immunization: Secondary | ICD-10-CM

## 2015-04-03 DIAGNOSIS — I639 Cerebral infarction, unspecified: Secondary | ICD-10-CM

## 2015-04-03 DIAGNOSIS — E785 Hyperlipidemia, unspecified: Secondary | ICD-10-CM | POA: Diagnosis not present

## 2015-04-03 DIAGNOSIS — Z0000 Encounter for general adult medical examination without abnormal findings: Secondary | ICD-10-CM | POA: Diagnosis not present

## 2015-04-03 DIAGNOSIS — C9000 Multiple myeloma not having achieved remission: Secondary | ICD-10-CM

## 2015-04-03 DIAGNOSIS — IMO0002 Reserved for concepts with insufficient information to code with codable children: Secondary | ICD-10-CM

## 2015-04-03 DIAGNOSIS — M25521 Pain in right elbow: Secondary | ICD-10-CM | POA: Insufficient documentation

## 2015-04-03 DIAGNOSIS — I69320 Aphasia following cerebral infarction: Secondary | ICD-10-CM

## 2015-04-03 DIAGNOSIS — R7302 Impaired glucose tolerance (oral): Secondary | ICD-10-CM | POA: Diagnosis not present

## 2015-04-03 DIAGNOSIS — I10 Essential (primary) hypertension: Secondary | ICD-10-CM | POA: Diagnosis not present

## 2015-04-03 DIAGNOSIS — E041 Nontoxic single thyroid nodule: Secondary | ICD-10-CM

## 2015-04-03 DIAGNOSIS — I69921 Dysphasia following unspecified cerebrovascular disease: Secondary | ICD-10-CM

## 2015-04-03 LAB — COMPLETE METABOLIC PANEL WITH GFR
ALK PHOS: 52 U/L (ref 39–117)
ALT: 16 U/L (ref 0–35)
AST: 16 U/L (ref 0–37)
Albumin: 3.8 g/dL (ref 3.5–5.2)
BUN: 12 mg/dL (ref 6–23)
CO2: 26 meq/L (ref 19–32)
CREATININE: 0.68 mg/dL (ref 0.50–1.10)
Calcium: 10.2 mg/dL (ref 8.4–10.5)
Chloride: 102 mEq/L (ref 96–112)
GFR, Est African American: >89 mL/min
GFR, Est Non African American: >89 mL/min
Glucose, Bld: 127 mg/dL — ABNORMAL HIGH (ref 70–99)
Potassium: 3.6 mEq/L (ref 3.5–5.3)
SODIUM: 138 meq/L (ref 135–145)
TOTAL PROTEIN: 7.6 g/dL (ref 6.0–8.3)
Total Bilirubin: 0.6 mg/dL (ref 0.2–1.2)

## 2015-04-03 LAB — CBC
HCT: 40.8 % (ref 36.0–46.0)
Hemoglobin: 14.2 g/dL (ref 12.0–15.0)
MCH: 30.3 pg (ref 26.0–34.0)
MCHC: 34.8 g/dL (ref 30.0–36.0)
MCV: 87 fL (ref 78.0–100.0)
MPV: 9.8 fL (ref 8.6–12.4)
Platelets: 294 10*3/uL (ref 150–400)
RBC: 4.69 MIL/uL (ref 3.87–5.11)
RDW: 17.2 % — ABNORMAL HIGH (ref 11.5–15.5)
WBC: 9.4 10*3/uL (ref 4.0–10.5)

## 2015-04-03 LAB — LIPID PANEL
Cholesterol: 140 mg/dL (ref 0–200)
HDL: 37 mg/dL — ABNORMAL LOW (ref >=46)
LDL CALC: 72 mg/dL (ref 0–99)
TRIGLYCERIDES: 153 mg/dL — AB (ref <150)
Total CHOL/HDL Ratio: 3.8 Ratio
VLDL: 31 mg/dL (ref 0–40)

## 2015-04-03 LAB — TSH: TSH: 0.324 u[IU]/mL — ABNORMAL LOW (ref 0.350–4.500)

## 2015-04-03 NOTE — Patient Instructions (Addendum)
F/u in 4 months, call if you need me ebfore  Prevnar today  You are referred to Dr Benjamine Mola, oncology, Dr Aline Brochure and Dr Merlene Laughter  For arthritis pain, use topical rub, like bengay or myoflex, and take tylenol 500 mg one or two daily  CBC, lipid, cmp and eGFr, tSH , hBA1C vit D today  Thanks for choosing [facility], we consider it a privelige to serve you.

## 2015-04-03 NOTE — Progress Notes (Signed)
Subjective:    Patient ID: Melissa [name], female    DOB: [birth year], 69 y.o.   MRN: [id number]  HPI Preventive Screening-Counseling & Management   Patient present here today for a Medicare annual wellness visit.   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 No  Over the past two weeks, have you felt little interest or pleasure in doing things? No  Have you lost interest or pleasure in daily life? No  Do you often feel hopeless? No  Do you cry easily over simple problems? No   Activities of Daily Living  In your present state of health, do you have any difficulty performing the following activities? 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 Yes Normal Appearance?Yes  Oriented to person? unablePlace? 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 Unable to assess Displays appropriate judgment?unable to aassess Can read the correct time from a watch face? No, unable to access Are you having problems remembering things? Unable to access   Advanced Directives have been discussed with the patient?Yes and brochure provided to daughter, pt remains a full code at the time of visit   List the Names of Other Physician/Practitioners you currently use: careteams updated    Indicate any recent Medical Services you may have received from other than [facility] providers in the past year (date may be approximate).   Assessment:    Annual Wellness Exam   Plan:     Medicare Attestation  I have personally reviewed:  The patient's medical and social history  Their use of alcohol, tobacco or illicit drugs  Their current medications and supplements  The patient's functional ability including ADLs,fall risks, home safety risks, cognitive, and hearing and visual impairment  Diet and physical activities  Evidence for depression or mood disorders  The patient's weight, height, BMI, and visual acuity have been recorded in the chart. I have made referrals, counseling, and provided education to the patient based on review of the above and I have provided the patient with a written personalized care plan for preventive services.      Review of Systems     Objective:   Physical Exam  BP 144/80 mmHg  Pulse  60  Resp 18  SpO2 100% Unable to weigh in office. Height documented as 10ft 5in      Assessment & Plan:  Medicare annual wellness visit, subsequent Annual exam as documented. Counseling done  re healthy lifestyle involving commitment to 150 minutes exercise per week, heart healthy diet, and attaining healthy weight.The importance of adequate sleep also discussed. Regular seat  belt use and home safety, is also discussed. Changes in health habits are decided on by the patient with goals and time frames  set for achieving them. Immunization and cancer screening needs are specifically addressed at this visit.    Need for vaccination with 13-polyvalent pneumococcal conjugate vaccine After obtaining informed consent, the vaccine is  administered by LPN.    Thyroid nodule Enlarging thyroid nodule noted in 2015, however, pt had a massive CVA and has been out of health car system for some time in recovery attempt. Family requests ENT referral to address this issue    Multiple myeloma without remission Request by family to establish with local oncologist due to transportation issues. Will refer, for local care

## 2015-04-04 LAB — VITAMIN D 25 HYDROXY (VIT D DEFICIENCY, FRACTURES): VIT D 25 HYDROXY: 27 ng/mL — AB (ref 30–100)

## 2015-04-04 LAB — HEMOGLOBIN A1C
Hgb A1c MFr Bld: 5.8 % — ABNORMAL HIGH (ref <5.7)
MEAN PLASMA GLUCOSE: 120 mg/dL — AB (ref <117)

## 2015-04-05 MED ORDER — LEVOTHYROXINE SODIUM 75 MCG PO TABS: ORAL_TABLET | ORAL | Status: DC

## 2015-04-05 MED ORDER — VITAMIN D3 1.25 MG (50000 UT) PO TABS: 1.0000 | ORAL_TABLET | ORAL | Status: DC

## 2015-04-13 ENCOUNTER — Emergency Department (HOSPITAL_COMMUNITY)
Admission: EM | Admit: 2015-04-13 | Discharge: 2015-04-13 | Disposition: A | Discharge status: Home / Self Care | Payer: Medicare Other | Attending: Emergency Medicine | Admitting: Emergency Medicine

## 2015-04-13 ENCOUNTER — Other Ambulatory Visit: Payer: Self-pay | Admitting: Family Medicine

## 2015-04-13 ENCOUNTER — Encounter (HOSPITAL_COMMUNITY): Payer: Self-pay | Admitting: Emergency Medicine

## 2015-04-13 ENCOUNTER — Emergency Department (HOSPITAL_COMMUNITY): Payer: Medicare Other

## 2015-04-13 ENCOUNTER — Telehealth: Payer: Self-pay

## 2015-04-13 DIAGNOSIS — R1084 Generalized abdominal pain: Secondary | ICD-10-CM | POA: Diagnosis not present

## 2015-04-13 DIAGNOSIS — I10 Essential (primary) hypertension: Secondary | ICD-10-CM | POA: Insufficient documentation

## 2015-04-13 DIAGNOSIS — E669 Obesity, unspecified: Secondary | ICD-10-CM | POA: Insufficient documentation

## 2015-04-13 DIAGNOSIS — Z79899 Other long term (current) drug therapy: Secondary | ICD-10-CM | POA: Diagnosis not present

## 2015-04-13 DIAGNOSIS — Z872 Personal history of diseases of the skin and subcutaneous tissue: Secondary | ICD-10-CM | POA: Insufficient documentation

## 2015-04-13 DIAGNOSIS — Z8579 Personal history of other malignant neoplasms of lymphoid, hematopoietic and related tissues: Secondary | ICD-10-CM | POA: Diagnosis not present

## 2015-04-13 DIAGNOSIS — E785 Hyperlipidemia, unspecified: Secondary | ICD-10-CM | POA: Insufficient documentation

## 2015-04-13 DIAGNOSIS — Z8673 Personal history of transient ischemic attack (TIA), and cerebral infarction without residual deficits: Secondary | ICD-10-CM | POA: Insufficient documentation

## 2015-04-13 DIAGNOSIS — E039 Hypothyroidism, unspecified: Secondary | ICD-10-CM | POA: Insufficient documentation

## 2015-04-13 DIAGNOSIS — Z7951 Long term (current) use of inhaled steroids: Secondary | ICD-10-CM | POA: Diagnosis not present

## 2015-04-13 DIAGNOSIS — K5730 Diverticulosis of large intestine without perforation or abscess without bleeding: Secondary | ICD-10-CM | POA: Diagnosis not present

## 2015-04-13 DIAGNOSIS — Z8739 Personal history of other diseases of the musculoskeletal system and connective tissue: Secondary | ICD-10-CM | POA: Diagnosis not present

## 2015-04-13 DIAGNOSIS — Z9889 Other specified postprocedural states: Secondary | ICD-10-CM | POA: Diagnosis not present

## 2015-04-13 DIAGNOSIS — Z85528 Personal history of other malignant neoplasm of kidney: Secondary | ICD-10-CM | POA: Insufficient documentation

## 2015-04-13 DIAGNOSIS — R109 Unspecified abdominal pain: Secondary | ICD-10-CM | POA: Insufficient documentation

## 2015-04-13 DIAGNOSIS — R1013 Epigastric pain: Secondary | ICD-10-CM | POA: Diagnosis not present

## 2015-04-13 DIAGNOSIS — Z7982 Long term (current) use of aspirin: Secondary | ICD-10-CM | POA: Insufficient documentation

## 2015-04-13 LAB — COMPREHENSIVE METABOLIC PANEL
ALBUMIN: 3.5 g/dL (ref 3.5–5.2)
ALT: 22 U/L (ref 0–35)
AST: 23 U/L (ref 0–37)
Alkaline Phosphatase: 51 U/L (ref 39–117)
Anion gap: 6 (ref 5–15)
BUN: 11 mg/dL (ref 6–23)
CO2: 29 mmol/L (ref 19–32)
Calcium: 10 mg/dL (ref 8.4–10.5)
Chloride: 104 mmol/L (ref 96–112)
Creatinine, Ser: 0.72 mg/dL (ref 0.50–1.10)
GFR calc Af Amer: >90 mL/min (ref >90)
GFR, EST NON AFRICAN AMERICAN: 86 mL/min — AB (ref >90)
Glucose, Bld: 123 mg/dL — ABNORMAL HIGH (ref 70–99)
Potassium: 3.4 mmol/L — ABNORMAL LOW (ref 3.5–5.1)
Sodium: 139 mmol/L (ref 135–145)
Total Bilirubin: 0.6 mg/dL (ref 0.3–1.2)
Total Protein: 8 g/dL (ref 6.0–8.3)

## 2015-04-13 LAB — URINE MICROSCOPIC-ADD ON

## 2015-04-13 LAB — URINALYSIS, ROUTINE W REFLEX MICROSCOPIC
Bilirubin Urine: NEGATIVE
Glucose, UA: NEGATIVE mg/dL
Hgb urine dipstick: NEGATIVE
KETONES UR: NEGATIVE mg/dL
LEUKOCYTES UA: NEGATIVE
Nitrite: NEGATIVE
PH: 8 (ref 5.0–8.0)
Specific Gravity, Urine: 1.02 (ref 1.005–1.030)
Urobilinogen, UA: 0.2 mg/dL (ref 0.0–1.0)

## 2015-04-13 LAB — CBC WITH DIFFERENTIAL/PLATELET
BASOS ABS: 0 10*3/uL (ref 0.0–0.1)
Basophils Relative: 0 % (ref 0–1)
EOS ABS: 0.1 10*3/uL (ref 0.0–0.7)
EOS PCT: 2 % (ref 0–5)
HCT: 36.6 % (ref 36.0–46.0)
Hemoglobin: 12.9 g/dL (ref 12.0–15.0)
Lymphocytes Relative: 29 % (ref 12–46)
Lymphs Abs: 2.5 10*3/uL (ref 0.7–4.0)
MCH: 30.7 pg (ref 26.0–34.0)
MCHC: 35.2 g/dL (ref 30.0–36.0)
MCV: 87.1 fL (ref 78.0–100.0)
Monocytes Absolute: 0.4 10*3/uL (ref 0.1–1.0)
Monocytes Relative: 5 % (ref 3–12)
Neutro Abs: 5.7 10*3/uL (ref 1.7–7.7)
Neutrophils Relative %: 65 % (ref 43–77)
PLATELETS: 262 10*3/uL (ref 150–400)
RBC: 4.2 MIL/uL (ref 3.87–5.11)
RDW: 16.4 % — AB (ref 11.5–15.5)
WBC: 8.8 10*3/uL (ref 4.0–10.5)

## 2015-04-13 LAB — LIPASE, BLOOD: Lipase: 21 U/L (ref 11–59)

## 2015-04-13 MED ORDER — SODIUM CHLORIDE 0.9 % IV BOLUS (SEPSIS)
500.0000 mL | Freq: Once | INTRAVENOUS | Status: AC
  Administered 2015-04-13: 500 mL via INTRAVENOUS

## 2015-04-13 MED ORDER — DICYCLOMINE HCL 10 MG PO CAPS: 10.0000 mg | ORAL_CAPSULE | Freq: Two times a day (BID) | ORAL | Status: DC | PRN

## 2015-04-13 MED ORDER — SODIUM CHLORIDE 0.9 % IV SOLN
INTRAVENOUS | Status: DC
  Administered 2015-04-13: 14:00:00 via INTRAVENOUS

## 2015-04-13 MED ORDER — IOHEXOL 300 MG/ML  SOLN
100.0000 mL | Freq: Once | INTRAMUSCULAR | Status: AC | PRN
  Administered 2015-04-13: 100 mL via INTRAVENOUS

## 2015-04-13 NOTE — ED Notes (Signed)
EMS reported that daughter reported to them that pt c/o upper abdominal pain today after feeding her through her peg tube. PT non-verbal after an old stroke but does moan on arrival to ED.

## 2015-04-13 NOTE — Telephone Encounter (Signed)
States they are at the ER with Melissa [name] because of painful stomach spasms and bloating. They are discharging her but patient still having symptoms. Dr mentioned that she may be getting to frequent feedings. Daughter wants you yo review the hospital notes and see what you recommend.

## 2015-04-13 NOTE — Discharge Instructions (Signed)
Workup for the abdominal pain without any significant findings. G-tube is in the proper place. Would recommend follow-up with Dr. Moshe Cipro. May need some adjustments on the tube feedings. Also would return for any development of vomiting. Would continue her hydrocodone which will help with the abdominal cramps. If diarrhea starts that could been the cause of the complaint from earlier today. Again CAT scan shows no indication for admission or any surgical problem.

## 2015-04-13 NOTE — ED Provider Notes (Signed)
CSN: [id number]     Arrival date & time 04/13/15  1050 History  This chart was scribed for Fredia Sorrow, MD by Mercy Moore, ED scribe.  This patient was seen in room APA02/APA02 and the patient's care was started at 12:44 PM.   Chief Complaint  Patient presents with  . Abdominal Pain   Patient is a 69 y.o. female presenting with abdominal pain. The history is provided by a relative.  Abdominal Pain  Level 5 Caveat: Non-verbal   HPI Comments: Melissa [name] is  69 y.o. female status post stroke in 08/2014 brought in by ambulance, who presents to the Emergency Department after daughter reported abdominal spasming during her feeding this morning. Daughter reports frequency of spasming and swelling every 10 minutes prompting daughter to discontinue her feeding. Daughter denies vomiting.  Patient has gastric tube placed after her stroke in 08/2014. 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    Past Medical History  Diagnosis Date  . Vertigo, intermittent   . Hypothyroidism   . Hyperlipidemia   . Obesity   . Hypertension     severe and resistant to treatment; 2008-negative left renal angiogram  . Vitiligo   . Degenerative disc disease, lumbar   . Degenerative disc disease, cervical   . Chest pain 2008    2008-normal coronary angiography  . 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 Renal cell carcinoma     Right nephrectomy  . Multiple myeloma 08/26/2014   Past Surgical History  Procedure Laterality Date  . Tubal ligation  1983    Bilateral  . Nephrectomy      Right; secondary to renal cell carcinoma  . Colonoscopy N/A 04/21/2014    Procedure: COLONOSCOPY;  Surgeon: Rogene Houston, MD;  Location: [facility];  Service: Endoscopy;  Laterality: N/A;  . Colonoscopy N/A 04/21/2014    Procedure: COLONOSCOPY;  Surgeon: Rogene Houston, MD;  Location: [facility];  Service: Endoscopy;  Laterality: N/A;  1030  . Tee without  cardioversion N/A 09/23/2014    Procedure: TRANSESOPHAGEAL ECHOCARDIOGRAM (TEE);  Surgeon: Sanda Klein, MD;  Location: [facility] ENDOSCOPY;  Service: Cardiovascular;  Laterality: N/A;   Family History  Problem Relation Age of Onset  . Heart disease Mother 11  . Hypertension Mother   . Colon cancer Father   . Hypertension Sister     x2  . Diabetes Sister     x2  . Heart failure Sister   . Lupus Brother    History  Substance Use Topics  . Smoking status: Never Smoker   . Smokeless tobacco: Never Used  . Alcohol Use: No   OB History    No data available     Review of Systems  Gastrointestinal: Positive for abdominal pain.   level V caveat applies due to the patient's previous stroke and aphasia.  Allergies  Morphine  Home Medications   Prior to Admission medications   Medication Sig Start Date End Date Taking? Authorizing Provider  acetaminophen (TYLENOL) 650 MG CR tablet 650 mg by Feeding Tube route every 4 (four) hours as needed for pain.   Yes Historical Provider, MD  albuterol (PROVENTIL) (2.5 MG/3ML) 0.083% nebulizer solution Take 3 mLs (2.5 mg total) by nebulization every 4 (four) hours as needed for wheezing or shortness of breath. 10/01/14  Yes Ripudeep Krystal Eaton, MD  Amino Acids-Protein Hydrolys (FEEDING SUPPLEMENT, PRO-STAT SUGAR FREE 64,) LIQD Take 30 mLs by mouth daily. Patient  taking differently: Take 30 mLs by mouth 5 (five) times daily.  10/01/14  Yes Ripudeep Krystal Eaton, MD  amLODipine (NORVASC) 10 MG tablet Place 1 tablet (10 mg total) into feeding tube every morning. 10/01/14  Yes Ripudeep Krystal Eaton, MD  aspirin 81 MG chewable tablet Chew by mouth daily.   Yes Historical Provider, MD  budesonide-formoterol (SYMBICORT) 160-4.5 MCG/ACT inhaler Inhale 2 puffs into the lungs daily as needed (Shortness Of Breath).    Yes Historical Provider, MD  carvedilol (COREG) 6.25 MG tablet PLACE ONE TABLET INTO FEEDING TUBE TWO TIMES A DAY WITH A MEAL 02/24/15  Yes Fayrene Helper, MD   clonazePAM Bobbye Charleston) 0.5 MG tablet One tablet three times daily, every 8 hours for anxiety andf agitation 12/21/14  Yes Fayrene Helper, MD  cloNIDine (CATAPRES - DOSED IN MG/24 HR) 0.3 mg/24hr patch APPLY ONE PATCH TOPICALLY ONCE A WEEK 04/13/15  Yes Fayrene Helper, MD  FLUoxetine (PROZAC) 10 MG capsule Take 10 mg by mouth daily.   Yes Historical Provider, MD  fluticasone (FLONASE) 50 MCG/ACT nasal spray Place 2 sprays into both nostrils daily. 08/26/14  Yes Farrel Gobble, MD  gabapentin (NEURONTIN) 300 MG capsule TAKE ONE CAPSULE BY MOUTH TWICE DAILY 03/16/15  Yes Fayrene Helper, MD  HYDROcodone-acetaminophen (NORCO/VICODIN) 5-325 MG per tablet Take 1 tablet by mouth 2 (two) times daily as needed for moderate pain. 02/03/15  Yes Fayrene Helper, MD  levothyroxine (SYNTHROID, LEVOTHROID) 75 MCG tablet One tablet mon-fri and one half tab on sat and sun 04/05/15  Yes Fayrene Helper, MD  lovastatin (MEVACOR) 40 MG tablet Place 1 tablet (40 mg total) into feeding tube at bedtime. 10/01/14  Yes Ripudeep Krystal Eaton, MD  meclizine (ANTIVERT) 25 MG tablet Place 25 mg into feeding tube 3 (three) times daily as needed for dizziness.    Yes Historical Provider, MD  polyethylene glycol (MIRALAX / GLYCOLAX) packet Take 17 g by mouth daily as needed for mild constipation.    Yes Historical Provider, MD  Water For Irrigation, Sterile (FREE WATER) SOLN Place 100 mLs into feeding tube every 4 (four) hours. 10/01/14  Yes Ripudeep Krystal Eaton, MD  Cholecalciferol (VITAMIN D3) 50000 UNITS TABS Take 1 tablet by mouth once a week. By feeding tube Patient not taking: Reported on 04/13/2015 04/05/15   Fayrene Helper, MD  citalopram (CELEXA) 10 MG tablet Take 1 tablet (10 mg total) by mouth daily. Patient not taking: Reported on 04/13/2015 01/17/15   Fayrene Helper, MD  Nutritional Supplements (FEEDING SUPPLEMENT, GLUCERNA 1.2 CAL,) LIQD Place 1,000 mLs into feeding tube continuous. At 55cc/hr Patient not taking:  Reported on 04/13/2015 10/01/14   Ripudeep Krystal Eaton, MD  pantoprazole sodium (PROTONIX) 40 mg/20 mL PACK Place 20 mLs (40 mg total) into feeding tube 2 (two) times daily. Patient not taking: Reported on 04/13/2015 10/01/14   Ripudeep Krystal Eaton, MD   Triage Vitals: BP 165/71 mmHg  Pulse 76  Resp 18  SpO2 100% Physical Exam  HENT:  Mucous membranes are little dry.  Cardiovascular: Normal rate and regular rhythm.   Pulmonary/Chest: Effort normal and breath sounds normal.  Abdominal: Soft. Bowel sounds are normal. She exhibits no distension. There is no tenderness.  Gastic tube in place in epigastrium.   Musculoskeletal: She exhibits no edema.  Movement on left side, on right.   Neurological: She is alert.  A phasic due to stroke and right sided marked weakness due to stroke.  Nursing note and  vitals reviewed.   ED Course  Procedures (including critical care time)  COORDINATION OF CARE: 3:41 PM- Discussed treatment plan with patient at bedside and patient agreed to plan.   Results for orders placed or performed during the hospital encounter of 04/13/15  CBC with Differential  Result Value Ref Range   WBC 8.8 4.0 - 10.5 K/uL   RBC 4.20 3.87 - 5.11 MIL/uL   Hemoglobin 12.9 12.0 - 15.0 g/dL   HCT 36.6 36.0 - 46.0 %   MCV 87.1 78.0 - 100.0 fL   MCH 30.7 26.0 - 34.0 pg   MCHC 35.2 30.0 - 36.0 g/dL   RDW 16.4 (H) 11.5 - 15.5 %   Platelets 262 150 - 400 K/uL   Neutrophils Relative % 65 43 - 77 %   Neutro Abs 5.7 1.7 - 7.7 K/uL   Lymphocytes Relative 29 12 - 46 %   Lymphs Abs 2.5 0.7 - 4.0 K/uL   Monocytes Relative 5 3 - 12 %   Monocytes Absolute 0.4 0.1 - 1.0 K/uL   Eosinophils Relative 2 0 - 5 %   Eosinophils Absolute 0.1 0.0 - 0.7 K/uL   Basophils Relative 0 0 - 1 %   Basophils Absolute 0.0 0.0 - 0.1 K/uL  Comprehensive metabolic panel  Result Value Ref Range   Sodium 139 135 - 145 mmol/L   Potassium 3.4 (L) 3.5 - 5.1 mmol/L   Chloride 104 96 - 112 mmol/L   CO2 29 19 - 32 mmol/L    Glucose, Bld 123 (H) 70 - 99 mg/dL   BUN 11 6 - 23 mg/dL   Creatinine, Ser 0.72 0.50 - 1.10 mg/dL   Calcium 10.0 8.4 - 10.5 mg/dL   Total Protein 8.0 6.0 - 8.3 g/dL   Albumin 3.5 3.5 - 5.2 g/dL   AST 23 0 - 37 U/L   ALT 22 0 - 35 U/L   Alkaline Phosphatase 51 39 - 117 U/L   Total Bilirubin 0.6 0.3 - 1.2 mg/dL   GFR calc non Af Amer 86 (L) >90 mL/min   GFR calc Af Amer >90 >90 mL/min   Anion gap 6 5 - 15  Urinalysis, Routine w reflex microscopic  Result Value Ref Range   Color, Urine YELLOW YELLOW   APPearance HAZY (A) CLEAR   Specific Gravity, Urine 1.020 1.005 - 1.030   pH 8.0 5.0 - 8.0   Glucose, UA NEGATIVE NEGATIVE mg/dL   Hgb urine dipstick NEGATIVE NEGATIVE   Bilirubin Urine NEGATIVE NEGATIVE   Ketones, ur NEGATIVE NEGATIVE mg/dL   Protein, ur TRACE (A) NEGATIVE mg/dL   Urobilinogen, UA 0.2 0.0 - 1.0 mg/dL   Nitrite NEGATIVE NEGATIVE   Leukocytes, UA NEGATIVE NEGATIVE  Urine microscopic-add on  Result Value Ref Range   WBC, UA 0-2 <3 WBC/hpf   Bacteria, UA MANY (A) RARE  Lipase, blood  Result Value Ref Range   Lipase 21 11 - 59 U/L   Ct Abdomen Pelvis W Contrast  04/13/2015   CLINICAL DATA:  Abdominal spasms per daughter today during feeding through PEG tube.  EXAM: CT ABDOMEN AND PELVIS WITH CONTRAST  TECHNIQUE: Multidetector CT imaging of the abdomen and pelvis was performed using the standard protocol following bolus administration of intravenous contrast.  CONTRAST:  134m OMNIPAQUE IOHEXOL 300 MG/ML  SOLN  COMPARISON:  11/06/2014  FINDINGS: Lung bases demonstrate stable bibasilar fibrosis. Stable mild cardiomegaly. Mild calcified plaque over the left main, right coronary and left anterior descending coronary  arteries.  Abdominal images demonstrate patient's percutaneous gastrostomy tube as the balloon portion of the tube lies within the distal body of the stomach approximately 2.8 cm from the anterior gastric wall. There is a 1.5 cm linear metallic density adjacent  the tube as it enters the stomach. There is moderate contrast within the stomach and small bowel. Small bowel is otherwise within normal. There is mild fecal retention throughout the colon. There is mild diverticulosis of the distal descending and sigmoid colon. There is moderate fecal retention over the rectum.  The liver, spleen, gallbladder, pancreas and adrenal glands are within normal. 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 Left kidney is normal. Left ureter is normal. Appendix is normal. There is mild calcified plaque over the abdominal aorta and iliac arteries.  Pelvic images demonstrate the bladder, uterus and ovaries to be within normal. No significant free fluid. Again noted is moderate fecal retention over the rectum.  Mild degenerative changes of the spine with multilevel disc disease over the lumbar spine unchanged. Degenerative change of the hips. 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  IMPRESSION: No acute findings in the abdomen/pelvis. Moderate fecal retention over the rectum. No obstruction per  Percutaneous gastrostomy tube with balloon portion of the tube within the distal body of the stomach.  Single left kidney.  Minimal diverticulosis of the colon.  Stable fibrotic change within the lung bases.  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   Electronically Signed   By: Marin Olp M.D.   On: 04/13/2015 14:47      EKG Interpretation   Date/Time:  Thursday April 13 2015 10:56:10 EDT Ventricular Rate:  68 PR Interval:  169 QRS Duration: 99 QT Interval:  405 QTC Calculation: 431 R Axis:   84 Text Interpretation:  Sinus rhythm Multiform ventricular premature  complexes Borderline right axis deviation Probable left ventricular  hypertrophy Borderline T  abnormalities, inferior leads Baseline wander in  lead(s) V2 V3 Confirmed by Marquette Blodgett  MD, Benecio Kluger ([id number]) on 04/13/2015  11:07:21 AM      MDM   Final diagnoses:  Abdominal pain    Patient with description by family members of significant abdominal cramping. Workup for this with a negative CT labs without any significant abnormalities. Exact cause of the symptoms is not clear. As well as G-tube is also an appropriate place. Could be due to caloric load from the tube feedings could possibly be of patient developing a diarrhea type illness but there hasn't been any diarrhea yet.  I personally performed the services described in this documentation, which was scribed in my presence. The recorded information has been reviewed and is accurate.     Fredia Sorrow, MD 04/13/15 1544

## 2015-04-13 NOTE — ED Notes (Signed)
MD at bedside. 

## 2015-04-14 ENCOUNTER — Other Ambulatory Visit: Payer: Self-pay

## 2015-04-14 MED ORDER — HYDROCODONE-ACETAMINOPHEN 5-325 MG PO TABS: 1.0000 | ORAL_TABLET | Freq: Two times a day (BID) | ORAL | Status: DC | PRN

## 2015-04-14 NOTE — Telephone Encounter (Signed)
Third attempt since yesterday to speak with [name], 3rd message left this morning Pls let her know that i did review the note, I sent bentyl for stomach cramps to her mother's  pharmacy yesterday pm and hope this will relieve her cramps

## 2015-04-14 NOTE — Telephone Encounter (Signed)
Patient aware.

## 2015-04-18 NOTE — ED Notes (Signed)
PT was being transferred via stand pivot with 2person assist and her left foot was caught between the front and back wheels on the wheel chair. PT was lowered to ground by this nurse and nurse Cyril Mourning. PT denied and pain and was assisted back to bed with 4x assist on backboard. Charge nurse made aware and safety zone portal was filled out. Family (daughter) present at time of fall and daughter assisted pt to w/c for discharge at 63. MD made aware of fall. No injury noted and pt denied any pain.

## 2015-04-21 ENCOUNTER — Encounter: Payer: Self-pay | Admitting: Family Medicine

## 2015-04-21 DIAGNOSIS — I6930 Unspecified sequelae of cerebral infarction: Secondary | ICD-10-CM | POA: Insufficient documentation

## 2015-04-21 DIAGNOSIS — E041 Nontoxic single thyroid nodule: Secondary | ICD-10-CM | POA: Insufficient documentation

## 2015-04-21 NOTE — Assessment & Plan Note (Signed)
Enlarging thyroid nodule noted in 2015, however, pt had a massive CVA and has been out of health car system for some time in recovery attempt. Family requests ENT referral to address this issue

## 2015-04-21 NOTE — Assessment & Plan Note (Signed)
Request by family to establish with local oncologist due to transportation issues. Will refer, for local care

## 2015-04-21 NOTE — Assessment & Plan Note (Signed)

## 2015-04-21 NOTE — Assessment & Plan Note (Signed)
After obtaining informed consent, the vaccine is  administered by LPN.  

## 2015-04-22 DIAGNOSIS — I63032 Cerebral infarction due to thrombosis of left carotid artery: Secondary | ICD-10-CM | POA: Diagnosis not present

## 2015-04-22 DIAGNOSIS — I6900 Unspecified sequelae of nontraumatic subarachnoid hemorrhage: Secondary | ICD-10-CM | POA: Diagnosis not present

## 2015-04-24 DIAGNOSIS — I63032 Cerebral infarction due to thrombosis of left carotid artery: Secondary | ICD-10-CM | POA: Diagnosis not present

## 2015-04-25 ENCOUNTER — Encounter: Payer: Self-pay | Admitting: Orthopedic Surgery

## 2015-04-25 ENCOUNTER — Ambulatory Visit (INDEPENDENT_AMBULATORY_CARE_PROVIDER_SITE_OTHER): Payer: Medicare Other

## 2015-04-25 ENCOUNTER — Ambulatory Visit (INDEPENDENT_AMBULATORY_CARE_PROVIDER_SITE_OTHER): Payer: Medicare Other | Admitting: Orthopedic Surgery

## 2015-04-25 VITALS — BP 137/65 | Ht 68.0 in | Wt 240.0 lb

## 2015-04-25 DIAGNOSIS — G8190 Hemiplegia, unspecified affecting unspecified side: Secondary | ICD-10-CM | POA: Diagnosis not present

## 2015-04-25 DIAGNOSIS — M25521 Pain in right elbow: Secondary | ICD-10-CM | POA: Diagnosis not present

## 2015-04-25 DIAGNOSIS — I69359 Hemiplegia and hemiparesis following cerebral infarction affecting unspecified side: Secondary | ICD-10-CM

## 2015-04-25 NOTE — Progress Notes (Signed)
Patient ID: Melissa [name], female   DOB: 01/18/1946, 69 y.o.   MRN: [id number]  Chief Complaint  Patient presents with  . Elbow Pain    right elbow pain, ref. SIMPSON     Melissa [name] is a 69 y.o. female.   HPI 69 year old female had a stroke over 6 months ago. Her daughter who is her caregiver noticed in the last 6 months she had progressive loss of range of motion increasing pain in the right elbow which is constant and 9 out of 10 in unrelieved by hydrocodone and gabapentin. No trauma. The patient is a phasic. Review of Systems Burning pain in her legs dizziness hayfever joint pain muscle weakness stiff joints back pain depression and anxiety wheezing  Past Medical History  Diagnosis Date  . Vertigo, intermittent   . Hypothyroidism   . Hyperlipidemia   . Obesity   . Hypertension     severe and resistant to treatment; 2008-negative left renal angiogram  . Vitiligo   . Degenerative disc disease, lumbar   . Degenerative disc disease, cervical   . Chest pain 2008    2008-normal coronary angiography  . 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 Renal cell carcinoma     Right nephrectomy  . Multiple myeloma 08/26/2014    Past Surgical History  Procedure Laterality Date  . Tubal ligation  1983    Bilateral  . Nephrectomy      Right; secondary to renal cell carcinoma  . Colonoscopy N/A 04/21/2014    Procedure: COLONOSCOPY;  Surgeon: Malissa Hippo, MD;  [facility];  Service: Endoscopy;  Laterality: N/A;  . Colonoscopy N/A 04/21/2014    Procedure: COLONOSCOPY;  Surgeon: Malissa Hippo, MD;  [facility];  Service: Endoscopy;  Laterality: N/A;  1030  . Tee without cardioversion N/A 09/23/2014    Procedure: TRANSESOPHAGEAL ECHOCARDIOGRAM (TEE);  Surgeon: Thurmon Fair, MD;  Location: [facility] ENDOSCOPY;  Service: Cardiovascular;  Laterality: N/A;    Family History  Problem Relation Age of Onset  . Heart disease Mother 80  . Hypertension Mother   .  Colon cancer Father   . Hypertension Sister     x2  . Diabetes Sister     x2  . Heart failure Sister   . Lupus Brother     Social History History  Substance Use Topics  . Smoking status: Never Smoker   . Smokeless tobacco: Never Used  . Alcohol Use: No    Allergies  Allergen Reactions  . Morphine Nausea And Vomiting    Current Outpatient Prescriptions  Medication Sig Dispense Refill  . acetaminophen (TYLENOL) 650 MG CR tablet 650 mg by Feeding Tube route every 4 (four) hours as needed for pain.    Marland Kitchen albuterol (PROVENTIL) (2.5 MG/3ML) 0.083% nebulizer solution Take 3 mLs (2.5 mg total) by nebulization every 4 (four) hours as needed for wheezing or shortness of breath. 75 mL 12  . Amino Acids-Protein Hydrolys (FEEDING SUPPLEMENT, PRO-STAT SUGAR FREE 64,) LIQD Take 30 mLs by mouth daily. (Patient taking differently: Take 30 mLs by mouth 5 (five) times daily. ) 900 mL 0  . amLODipine (NORVASC) 10 MG tablet Place 1 tablet (10 mg total) into feeding tube every morning.    Marland Kitchen aspirin 81 MG chewable tablet Chew by mouth daily.    . budesonide-formoterol (SYMBICORT) 160-4.5 MCG/ACT inhaler Inhale 2 puffs into the lungs daily as needed (Shortness Of Breath).     . carvedilol (COREG)  6.25 MG tablet PLACE ONE TABLET INTO FEEDING TUBE TWO TIMES A DAY WITH A MEAL 60 tablet 3  . Cholecalciferol (VITAMIN D3) 50000 UNITS TABS Take 1 tablet by mouth once a week. By feeding tube 4 tablet 5  . clonazePAM (KLONOPIN) 0.5 MG tablet One tablet three times daily, every 8 hours for anxiety andf agitation 90 tablet 3  . cloNIDine (CATAPRES - DOSED IN MG/24 HR) 0.3 mg/24hr patch APPLY ONE PATCH TOPICALLY ONCE A WEEK 4 patch 2  . dicyclomine (BENTYL) 10 MG capsule Take 1 capsule (10 mg total) by mouth 2 (two) times daily as needed for spasms. 30 capsule 0  . FLUoxetine (PROZAC) 10 MG capsule Take 10 mg by mouth daily.    . fluticasone (FLONASE) 50 MCG/ACT nasal spray Place 2 sprays into both nostrils daily. 16  g 6  . gabapentin (NEURONTIN) 300 MG capsule TAKE ONE CAPSULE BY MOUTH TWICE DAILY 60 capsule 1  . HYDROcodone-acetaminophen (NORCO/VICODIN) 5-325 MG per tablet Take 1 tablet by mouth 2 (two) times daily as needed for moderate pain. 60 tablet 0  . levothyroxine (SYNTHROID, LEVOTHROID) 75 MCG tablet One tablet mon-fri and one half tab on sat and sun 26 tablet 5  . lovastatin (MEVACOR) 40 MG tablet Place 1 tablet (40 mg total) into feeding tube at bedtime.    . meclizine (ANTIVERT) 25 MG tablet Place 25 mg into feeding tube 3 (three) times daily as needed for dizziness.     . pantoprazole sodium (PROTONIX) 40 mg/20 mL PACK Place 20 mLs (40 mg total) into feeding tube 2 (two) times daily. 30 each   . polyethylene glycol (MIRALAX / GLYCOLAX) packet Take 17 g by mouth daily as needed for mild constipation.     . Water For Irrigation, Sterile (FREE WATER) SOLN Place 100 mLs into feeding tube every 4 (four) hours.     No current facility-administered medications for this visit.       Physical Exam Blood pressure 137/65, height 5\' 8"  (1.727 m), weight 240 lb (108.863 kg). Physical Exam The patient is well developed well nourished and well groomed. Orientation to person place and time is normal  Mood is pleasant. Ambulatory status primarily has to stay in a wheelchair comes in in a wheelchair Right elbow held in flexion painful range of motion all planes decreased extension by about 25 flexion is 125 tenderness over the posterior elbow stability tests were normal motor exam showed significant weakness skin was intact pressure and pain sensation were intact pulses and capillary refill normal lymph nodes negative elbow and axilla  Data Reviewed I ordered an x-ray I interpreted the x-ray as posterior impingement syndrome olecranon humeral   Assessment Encounter Diagnoses  Name Primary?  . Right elbow pain Yes  . Hemiplegia affecting dominant side, post-stroke     Plan This may be some type  of heterotopic bone formation. I think she is on enough narcotic and neurogenic type pain relievers I've added [facility] preparation #3 applied 4 times a day. No other treatment to offer really at this point

## 2015-05-02 DIAGNOSIS — R1311 Dysphagia, oral phase: Secondary | ICD-10-CM | POA: Diagnosis not present

## 2015-05-02 DIAGNOSIS — I63032 Cerebral infarction due to thrombosis of left carotid artery: Secondary | ICD-10-CM | POA: Diagnosis not present

## 2015-05-02 DIAGNOSIS — M6281 Muscle weakness (generalized): Secondary | ICD-10-CM | POA: Diagnosis not present

## 2015-05-08 ENCOUNTER — Encounter (HOSPITAL_COMMUNITY): Payer: Self-pay | Admitting: Hematology & Oncology

## 2015-05-08 ENCOUNTER — Encounter (HOSPITAL_COMMUNITY): Payer: Medicare Other | Attending: Hematology & Oncology | Admitting: Hematology & Oncology

## 2015-05-08 VITALS — BP 144/66 | HR 100 | Temp 99.3°F | Resp 18

## 2015-05-08 DIAGNOSIS — N189 Chronic kidney disease, unspecified: Secondary | ICD-10-CM | POA: Insufficient documentation

## 2015-05-08 DIAGNOSIS — Z8553 Personal history of malignant neoplasm of renal pelvis: Secondary | ICD-10-CM

## 2015-05-08 DIAGNOSIS — R803 Bence Jones proteinuria: Secondary | ICD-10-CM | POA: Diagnosis not present

## 2015-05-08 DIAGNOSIS — C9000 Multiple myeloma not having achieved remission: Secondary | ICD-10-CM | POA: Diagnosis not present

## 2015-05-08 LAB — COMPREHENSIVE METABOLIC PANEL
ALT: 21 U/L (ref 14–54)
AST: 22 U/L (ref 15–41)
Albumin: 3.7 g/dL (ref 3.5–5.0)
Alkaline Phosphatase: 54 U/L (ref 38–126)
Anion gap: 7 (ref 5–15)
BUN: 11 mg/dL (ref 6–20)
CO2: 30 mmol/L (ref 22–32)
Calcium: 10.2 mg/dL (ref 8.9–10.3)
Chloride: 104 mmol/L (ref 101–111)
Creatinine, Ser: 0.84 mg/dL (ref 0.44–1.00)
GFR calc non Af Amer: >60 mL/min (ref >60)
Glucose, Bld: 106 mg/dL — ABNORMAL HIGH (ref 65–99)
Potassium: 3.8 mmol/L (ref 3.5–5.1)
SODIUM: 141 mmol/L (ref 135–145)
TOTAL PROTEIN: 8.8 g/dL — AB (ref 6.5–8.1)
Total Bilirubin: 0.6 mg/dL (ref 0.3–1.2)

## 2015-05-08 LAB — CBC WITH DIFFERENTIAL/PLATELET
Basophils Absolute: 0 10*3/uL (ref 0.0–0.1)
Basophils Relative: 0 % (ref 0–1)
EOS ABS: 0.1 10*3/uL (ref 0.0–0.7)
Eosinophils Relative: 1 % (ref 0–5)
HEMATOCRIT: 37.1 % (ref 36.0–46.0)
HEMOGLOBIN: 13 g/dL (ref 12.0–15.0)
LYMPHS ABS: 1.9 10*3/uL (ref 0.7–4.0)
LYMPHS PCT: 18 % (ref 12–46)
MCH: 30.7 pg (ref 26.0–34.0)
MCHC: 35 g/dL (ref 30.0–36.0)
MCV: 87.7 fL (ref 78.0–100.0)
MONOS PCT: 5 % (ref 3–12)
Monocytes Absolute: 0.5 10*3/uL (ref 0.1–1.0)
Neutro Abs: 8.1 10*3/uL — ABNORMAL HIGH (ref 1.7–7.7)
Neutrophils Relative %: 76 % (ref 43–77)
Platelets: 306 10*3/uL (ref 150–400)
RBC: 4.23 MIL/uL (ref 3.87–5.11)
RDW: 16.2 % — ABNORMAL HIGH (ref 11.5–15.5)
WBC: 10.6 10*3/uL — AB (ref 4.0–10.5)

## 2015-05-08 NOTE — Patient Instructions (Signed)
[place] at [facility] Discharge Instructions  RECOMMENDATIONS MADE BY THE CONSULTANT AND ANY TEST RESULTS WILL BE SENT TO YOUR REFERRING PHYSICIAN.  Exam and discussion by Dr. Whitney Muse.   Will check labs today and will be in touch.  Follow-up in 6 weeks.  Thank you for choosing [place] at [facility] to provide your oncology and hematology care.  To afford each patient quality time with our provider, please arrive at least 15 minutes before your scheduled appointment time.    You need to re-schedule your appointment should you arrive 10 or more minutes late.  We strive to give you quality time with our providers, and arriving late affects you and other patients whose appointments are after yours.  Also, if you no show three or more times for appointments you may be dismissed from the clinic at the providers discretion.     Again, thank you for choosing [facility].  Our hope is that these requests will decrease the amount of time that you wait before being seen by our physicians.       _____________________________________________________________  Should you have questions after your visit to [facility], please contact our office at [phone] between the hours of 8:30 a.m. and 4:30 p.m.  Voicemails left after 4:30 p.m. will not be returned until the following business day.  For prescription refill requests, have your pharmacy contact our office.

## 2015-05-08 NOTE — Progress Notes (Signed)
Melissa Overman, MD [address] [place] [address]   Multiple myeloma, IgG kappa, BMBX with 20% plasma cells Had a stroke last year 09/18/2014, Left MCA territory. Aphasia, dysphagia, R sided hemiparesis History of R nephrectomy from [facility] 11/2007  CURRENT THERAPY: Observation  INTERVAL HISTORY: Melissa [name] 69 y.o. 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 BMBX showed 20% plasma cells. She was treated with RVD which was started on 09/05/2014. She only received cycle #1 of therapy. She presented to the ED at the end of September 2015 with altered mental status and ultimately diagnosed with a CVA.   Melissa [name] is unable to speak for herself so her daughters speak on her behalf.  They note that they visited with Dr. Bertis Ruddy in the fall after Melissa [name] CVA. Her daughters state that they then shifted focus to helping their mother recover from her CVA. Melissa [name] was diagnosed with a patient foramen ovale, which her daughters believe caused her CVA.  She has been home from the nursing home since December where her two daughters take care of her by themselves. Physical therapy and speech therapy come out to help. She is not able to talk, though she understands people talking to her. She is able to sing really well and she can repeat anything that you say to her. She can move her left leg but she cannot move her right leg at all, so she is unable to walk.   She eats all pureed food, though her appetite is not good. She lost her appetite before the chemo and before the stroke. She doesn't smile nearly as much as she used to and they all wonder if she is possibly depressed.  They are here today to discuss the status of Melissa [name] myeloma.      Multiple myeloma without remission   08/15/2014 Bone Marrow Biopsy Bone Marrow, Aspirate,Biopsy, and Clot, right iliac - HYPERCELLULAR BONE MARROW FOR AGE WITH  TRILINEAGE HEMATOPOIESIS. - PLASMACYTOSIS (PLASMA CELLS 20%).   09/05/2014 -  Chemotherapy Revlimid (25 mg days 1-14 every 21 days), Velcade (D1, 4, 8, and 11 every 21 days) and dexamethasone   09/18/2014 [facility] Admission CVA     Past Medical History  Diagnosis Date  . Vertigo, intermittent   . Hypothyroidism   . Hyperlipidemia   . Obesity   . Hypertension     severe and resistant to treatment; 2008-negative left renal angiogram  . Vitiligo   . Degenerative disc disease, lumbar   . Degenerative disc disease, cervical   . Chest pain 2008    2008-normal coronary angiography  . 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 Renal cell carcinoma     Right nephrectomy  . Multiple myeloma 08/26/2014    has Hypothyroid; Vitamin D deficiency; Obesity; Depression; HEARING LOSS; VERTIGO, INTERMITTENT; Vitiligo; Hyperlipidemia; Hypertension; History of kidney cancer; Anemia; Pulmonary fibrosis; Lung nodules; Positive ANA (antinuclear antibody); Knee pain, right; Radicular low back pain; Difficulty walking; Bilateral leg weakness; Bence Jones proteinuria; Multiple myeloma without remission; CVA (cerebral infarction); Thrombocytopenia, unspecified; CKD (chronic kidney disease) stage 3, GFR 30-59 ml/min; Dysphagia due to recent stroke; Pain in right elbow; Medicare annual wellness visit, subsequent; Need for vaccination with 13-polyvalent pneumococcal conjugate vaccine; Thyroid nodule; and Chronic ischemic left MCA stroke on her problem list.     is allergic to morphine.  Ms. Foisy does not currently have medications on file.  Past Surgical History  Procedure Laterality Date  . Tubal ligation  1983    Bilateral  . Nephrectomy      Right; secondary to renal cell carcinoma  . Colonoscopy N/A 04/21/2014    Procedure: COLONOSCOPY;  Surgeon: Rogene Houston, MD;  [facility];  Service: Endoscopy;  Laterality: N/A;  . Colonoscopy N/A 04/21/2014    Procedure: COLONOSCOPY;  Surgeon:  Rogene Houston, MD;  [facility];  Service: Endoscopy;  Laterality: N/A;  1030  . Tee without cardioversion N/A 09/23/2014    Procedure: TRANSESOPHAGEAL ECHOCARDIOGRAM (TEE);  Surgeon: Sanda Klein, MD;  Location: [facility] ENDOSCOPY;  Service: Cardiovascular;  Laterality: N/A;    She has 7 grandchildren and 1 great-grandchild.  Denies any headaches, dizziness, double vision, fevers, chills, night sweats, nausea, vomiting, diarrhea, constipation, chest pain, heart palpitations, shortness of breath, blood in stool, black tarry stool, urinary pain, urinary burning, urinary frequency, hematuria. Positive for loss of appetite. Positive for paralysis of right extremities. Aphasia, depression   PHYSICAL EXAMINATION  ECOG PERFORMANCE STATUS: 0 - Asymptomatic  Filed Vitals:   05/08/15 1000  BP: 144/66  Pulse: 100  Temp: 99.3 F (37.4 C)  Resp: 18    GENERAL:alert, no distress, well nourished, well developed, sitting in wheelchair. R sided paralysis obvious SKIN: skin color, texture, turgor are normal, no rashes or significant lesions HEAD: Normocephalic, No masses, lesions, tenderness or abnormalities EYES: normal, PERRLA, EOMI, Conjunctiva are pink and non-injected EARS: External ears normal OROPHARYNX:mucous membranes are moist  NECK: supple, no adenopathy, trachea midline LYMPH:  no palpable lymphadenopathy, no hepatosplenomegaly BREAST:not examined LUNGS: clear to auscultation  HEART: regular rate & rhythm ABDOMEN:abdomen soft, non-tender, obese and normal bowel sounds. Exam is limited secondary to patient sitting in wheelchair BACK: Back symmetric, no curvature., No CVA tenderness EXTREMITIES: Paralysis in right extremities. NEURO: alert, pleasant. Not conversant. R sided paralysis     LABORATORY DATA: CBC    Component Value Date/Time   WBC 10.6* 05/08/2015 1145   RBC 4.23 05/08/2015 1145   RBC 3.70* 07/31/2011 1245   HGB 13.0 05/08/2015 1145   HCT 37.1 05/08/2015  1145   PLT 306 05/08/2015 1145   MCV 87.7 05/08/2015 1145   MCH 30.7 05/08/2015 1145   MCHC 35.0 05/08/2015 1145   RDW 16.2* 05/08/2015 1145   LYMPHSABS 1.9 05/08/2015 1145   MONOABS 0.5 05/08/2015 1145   EOSABS 0.1 05/08/2015 1145   BASOSABS 0.0 05/08/2015 1145      Chemistry      Component Value Date/Time   NA 141 05/08/2015 1145   K 3.8 05/08/2015 1145   CL 104 05/08/2015 1145   CO2 30 05/08/2015 1145   BUN 11 05/08/2015 1145   CREATININE 0.84 05/08/2015 1145   CREATININE 0.68 04/03/2015 1122      Component Value Date/Time   CALCIUM 10.2 05/08/2015 1145   ALKPHOS 54 05/08/2015 1145   AST 22 05/08/2015 1145   ALT 21 05/08/2015 1145   BILITOT 0.6 05/08/2015 1145     Results for Melissa [name], Melissa [name] (MRN [id number]) as of 09/04/2014 20:51  Ref. Range 07/22/2014 09:41  LDH Latest Range: 94-250 U/L 134   Results for Melissa [name], Melissa [name] (MRN [id number]) as of 09/04/2014 20:51  Ref. Range 07/22/2014 09:41  Beta-2 Microglobulin Latest Range: <=2.51 mg/L 12.50 (H)   Results for Melissa [name], Melissa [name] (MRN [id number]) as of 09/04/2014 20:51  Ref. Range 07/22/2014 09:41 07/22/2014 09:41  Total Protein ELP Latest Range: 6.0-8.3 g/dL  9.0 (H)   Albumin ELP Latest Range: 55.8-66.1 %  46.4 (L)  Alpha-1-Globulin Latest Range: 2.9-4.9 %  4.2  Alpha-2-Globulin Latest Range: 7.1-11.8 %  9.3  Beta Globulin Latest Range: 4.7-7.2 %  4.9  Beta 2 Latest Range: 3.2-6.5 %  3.0 (L)  Gamma Globulin Latest Range: 11.1-18.8 %  32.2 (H)  M-SPIKE, % No range found  2.43  SPE Interp. No range found  (NOTE)  Comment No range found  (NOTE)  IgG (Immunoglobin G), Serum Latest Range: [phone] mg/dL 3100 (H) 3600 (H)  IgA Latest Range: 69-380 mg/dL 98 101  IgM, Serum Latest Range: 52-322 mg/dL 29 (L) 29 (L)  Kappa free light chain Latest Range: 0.33-1.94 mg/dL 7.55 (H)   Lamda free light chains Latest Range: 0.57-2.63 mg/dL 3.43 (H)   Kappa, lamda light chain ratio Latest Range: 0.26-1.65  2.20 (H)       Patient Active Problem List   Diagnosis Date Noted  . Thyroid nodule 04/21/2015  . Chronic ischemic left MCA stroke 04/21/2015  . Pain in right elbow 04/03/2015  . Medicare annual wellness visit, subsequent 04/03/2015  . Need for vaccination with 13-polyvalent pneumococcal conjugate vaccine 04/03/2015  . Dysphagia due to recent stroke 11/10/2014  . CKD (chronic kidney disease) stage 3, GFR 30-59 ml/min 09/19/2014  . CVA (cerebral infarction) 09/18/2014  . Thrombocytopenia, unspecified 09/18/2014  . Multiple myeloma without remission 08/29/2014  . 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 Radicular low back pain 04/18/2014  . Difficulty walking 04/18/2014  . Bilateral leg weakness 04/18/2014  . Positive ANA (antinuclear antibody) 03/22/2014  . Knee pain, right 03/22/2014  . Lung nodules 08/02/2013  . Pulmonary fibrosis 03/29/2013  . Anemia 08/13/2012  . Hyperlipidemia   . Hypertension   . History of kidney cancer   . Vitiligo 04/01/2011  . HEARING LOSS 07/18/2010  . Depression 05/18/2010  . Vitamin D deficiency 10/24/2009  . Hypothyroid 03/31/2008  . Obesity 03/31/2008  . VERTIGO, INTERMITTENT 03/31/2008      ASSESSMENT/ PLAN:  1. 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 2. Chronic kidney disease.  3. History of right renal cell carcinoma, status post resection with no postop therapy, surgery performed in December of 2008, no evidence of disease.  4. R sided CVA with paralysis and aphasia.  5. Patent foramen ovale   I discussed with the patient and her daughters that we can reassess the status of her disease and will proceed with blood work today.  Given the limited duration of therapy that she received she likely still has significant M protein. On chart review she had a normal myeloma survey. Per available records appears to have been started on therapy because of worsening renal function.  We discussed the complexity of the patient's current status.  We discussed quality of life issues. Discussed risks and benefits of myeloma therapy if we feel it is necessary or something that the patient and family wish to pursue. These are all questions that the daughters have discussed and discussed with her mother. For now they are just inquisitive regarding her disease status.  We will obtain laboratory studies today and I will see him back in several weeks to review the results. If they have interim problems or concerns I have advised them to call prior to follow-up.  All questions were answered. The patient knows to call the clinic with any problems, questions or concerns. We can certainly see the patient much sooner if necessary.   This document serves as a  record of services personally performed by Ancil Linsey, MD. It was created on her behalf by Arlyce Harman, a trained medical scribe. The creation of this record is based on the scribe's personal observations and the provider's statements to them. This document has been checked and approved by the attending provider.  I have reviewed the above documentation for accuracy and completeness, and I agree with the above. Molli Hazard, MD

## 2015-05-08 NOTE — Progress Notes (Signed)
Melissa [name] presented for labwork. Labs per MD order drawn via Peripheral Line 23 gauge needle inserted in left AC  Good blood return present. Procedure without incident.  Needle removed intact. Patient tolerated procedure well.

## 2015-05-09 LAB — PROTEIN ELECTROPHORESIS, SERUM
A/G RATIO SPE: 0.6 — AB (ref 0.7–2.0)
ALPHA-2-GLOBULIN: 1 g/dL (ref 0.4–1.2)
Albumin ELP: 3 g/dL — ABNORMAL LOW (ref 3.2–5.6)
Alpha-1-Globulin: 0.4 g/dL (ref 0.1–0.4)
Beta Globulin: 1.1 g/dL (ref 0.6–1.3)
GAMMA GLOBULIN: 2.4 g/dL — AB (ref 0.5–1.6)
Globulin, Total: 4.9 g/dL — ABNORMAL HIGH (ref 2.0–4.5)
M-SPIKE, %: 2.1 g/dL — AB
Total Protein ELP: 7.9 g/dL (ref 6.0–8.5)

## 2015-05-10 LAB — KAPPA/LAMBDA LIGHT CHAINS
KAPPA FREE LGHT CHN: 78.61 mg/L — AB (ref 3.30–19.40)
Kappa, lambda light chain ratio: 2.49 — ABNORMAL HIGH (ref 0.26–1.65)
Lambda free light chains: 31.55 mg/L — ABNORMAL HIGH (ref 5.71–26.30)

## 2015-05-10 LAB — IGG, IGA, IGM
IGG (IMMUNOGLOBIN G), SERUM: 2972 mg/dL — AB (ref 700–1600)
IgA: 216 mg/dL (ref 87–352)
IgM, Serum: 52 mg/dL (ref 26–217)

## 2015-05-11 LAB — IMMUNOFIXATION ELECTROPHORESIS
IGA: 230 mg/dL (ref 87–352)
IGG (IMMUNOGLOBIN G), SERUM: 3085 mg/dL — AB (ref 700–1600)
IGM, SERUM: 54 mg/dL (ref 26–217)
Total Protein ELP: 8 g/dL (ref 6.0–8.5)

## 2015-05-15 ENCOUNTER — Other Ambulatory Visit: Payer: Self-pay | Admitting: Family Medicine

## 2015-05-18 ENCOUNTER — Ambulatory Visit (INDEPENDENT_AMBULATORY_CARE_PROVIDER_SITE_OTHER): Payer: Medicare Other | Admitting: Otolaryngology

## 2015-05-18 DIAGNOSIS — D440 Neoplasm of uncertain behavior of thyroid gland: Secondary | ICD-10-CM | POA: Diagnosis not present

## 2015-05-19 ENCOUNTER — Other Ambulatory Visit (INDEPENDENT_AMBULATORY_CARE_PROVIDER_SITE_OTHER): Payer: Self-pay | Admitting: Otolaryngology

## 2015-05-19 DIAGNOSIS — E079 Disorder of thyroid, unspecified: Secondary | ICD-10-CM

## 2015-05-22 DIAGNOSIS — I63032 Cerebral infarction due to thrombosis of left carotid artery: Secondary | ICD-10-CM | POA: Diagnosis not present

## 2015-05-22 DIAGNOSIS — I6900 Unspecified sequelae of nontraumatic subarachnoid hemorrhage: Secondary | ICD-10-CM | POA: Diagnosis not present

## 2015-05-24 DIAGNOSIS — I6990 Unspecified sequelae of unspecified cerebrovascular disease: Secondary | ICD-10-CM | POA: Diagnosis not present

## 2015-05-24 DIAGNOSIS — M2550 Pain in unspecified joint: Secondary | ICD-10-CM | POA: Diagnosis not present

## 2015-05-24 DIAGNOSIS — I10 Essential (primary) hypertension: Secondary | ICD-10-CM | POA: Diagnosis not present

## 2015-05-24 DIAGNOSIS — M792 Neuralgia and neuritis, unspecified: Secondary | ICD-10-CM | POA: Diagnosis not present

## 2015-05-25 ENCOUNTER — Encounter (INDEPENDENT_AMBULATORY_CARE_PROVIDER_SITE_OTHER): Payer: Medicare Other

## 2015-05-25 ENCOUNTER — Ambulatory Visit (HOSPITAL_COMMUNITY)
Admission: RE | Admit: 2015-05-25 | Discharge: 2015-05-25 | Disposition: A | Discharge status: Home / Self Care | Payer: Medicare Other | Source: Ambulatory Visit | Attending: Otolaryngology | Admitting: Otolaryngology

## 2015-05-25 ENCOUNTER — Encounter (HOSPITAL_COMMUNITY): Payer: Self-pay

## 2015-05-25 ENCOUNTER — Other Ambulatory Visit (INDEPENDENT_AMBULATORY_CARE_PROVIDER_SITE_OTHER): Payer: Self-pay | Admitting: Otolaryngology

## 2015-05-25 ENCOUNTER — Other Ambulatory Visit: Payer: Self-pay

## 2015-05-25 DIAGNOSIS — E010 Iodine-deficiency related diffuse (endemic) goiter: Secondary | ICD-10-CM | POA: Diagnosis not present

## 2015-05-25 DIAGNOSIS — E079 Disorder of thyroid, unspecified: Secondary | ICD-10-CM

## 2015-05-25 DIAGNOSIS — G464 Cerebellar stroke syndrome: Secondary | ICD-10-CM | POA: Diagnosis not present

## 2015-05-25 DIAGNOSIS — I639 Cerebral infarction, unspecified: Secondary | ICD-10-CM | POA: Diagnosis not present

## 2015-05-25 MED ORDER — LIDOCAINE HCL (PF) 2 % IJ SOLN
INTRAMUSCULAR | Status: AC
  Filled 2015-05-25: qty 10

## 2015-05-25 NOTE — Discharge Instructions (Signed)
Thyroid Biopsy °The thyroid gland is a butterfly-shaped gland situated in the front of the neck. It produces hormones which affect metabolism, growth and development, and body temperature. A thyroid biopsy is a procedure in which small samples of tissue or fluid are removed from the thyroid gland or mass and examined under a microscope. This test is done to determine the cause of thyroid problems, such as infection, cancer, or other thyroid problems. °There are 2 ways to obtain samples: °1. Fine needle biopsy. Samples are removed using a thin needle inserted through the skin and into the thyroid gland or mass. °2. Open biopsy. Samples are removed after a cut (incision) is made through the skin. °LET YOUR CAREGIVER KNOW ABOUT:  °· Allergies. °· Medications taken including herbs, eye drops, over-the-counter medications, and creams. °· Use of steroids (by mouth or creams). °· Previous problems with anesthetics or numbing medicine. °· Possibility of pregnancy, if this applies. °· History of blood clots (thrombophlebitis). °· History of bleeding or blood problems. °· Previous surgery. °· Other health problems. °RISKS AND COMPLICATIONS °· Bleeding from the site. The risk of bleeding is higher if you have a bleeding disorder or are taking any blood thinning medications (anticoagulants). °· Infection. °· Injury to structures near the thyroid gland. °BEFORE THE PROCEDURE  °This is a procedure that can be done as an outpatient. Confirm the time that you need to arrive for your procedure. Confirm whether there is a need to fast or withhold any medications. A blood sample may be done to determine your blood clotting time. Medicine may be given to help you relax (sedative). °PROCEDURE °Fine needle biopsy. °You will be awake during the procedure. You may be asked to lie on your back with your head tipped backward to extend your neck. Let your caregiver know if you cannot tolerate the positioning. An area on your neck will be  cleansed. A needle is inserted through the skin of your neck. You may feel a mild discomfort during this procedure. You may be asked to avoid coughing, talking, swallowing, or making sounds during some portions of the procedure. The needle is withdrawn once tissue or fluid samples have been removed. Pressure may be applied to the neck to reduce swelling and ensure that bleeding has stopped. The samples will be sent for examination.  °Open biopsy. °You will be given general anesthesia. You will be asleep during the procedure. An incision is made in your neck. A sample of thyroid tissue or the mass is removed. The tissue sample or mass will be sent for examination. The sample or mass may be examined during the biopsy. If the sample or mass contains cancer cells, some or all of the thyroid gland may be removed. The incision is closed with stitches. °AFTER THE PROCEDURE  °Your recovery will be assessed and monitored. If there are no problems, as an outpatient, you should be able to go home shortly after the procedure. °If you had a fine needle biopsy: °· You may have soreness at the biopsy site for 1 to 2 days. °If you had an open biopsy:  °· You may have soreness at the biopsy site for 3 to 4 days. °· You may have a hoarse voice or sore throat for 1 to 2 days. °Obtaining the Test Results °It is your responsibility to obtain your test results. Do not assume everything is normal if you have not heard from your caregiver or the medical facility. It is important for you to follow up   on all of your test results. °HOME CARE INSTRUCTIONS  °· Keeping your head raised on a pillow when you are lying down may ease biopsy site discomfort. °· Supporting the back of your head and neck with both hands as you sit up from a lying position may ease biopsy site discomfort. °· Only take over-the-counter or prescription medicines for pain, discomfort, or fever as directed by your caregiver. °· Throat lozenges or gargling with warm salt  water may help to soothe a sore throat. °SEEK IMMEDIATE MEDICAL CARE IF:  °· You have severe bleeding from the biopsy site. °· You have difficulty swallowing. °· You have a fever. °· You have increased pain, swelling, redness, or warmth at the biopsy site. °· You notice pus coming from the biopsy site. °· You have swollen glands (lymph nodes) in your neck. °Document Released: 10/06/2007 Document Revised: 04/05/2013 Document Reviewed: 03/03/2014 °[employer]® Patient Information ©2015 [employer]. This information is not intended to replace advice given to you by your health care provider. Make sure you discuss any questions you have with your health care provider. ° °

## 2015-05-25 NOTE — Sedation Documentation (Signed)
Procedure cancelled after imaging.

## 2015-06-19 ENCOUNTER — Encounter (HOSPITAL_COMMUNITY): Payer: Medicare Other | Attending: Hematology & Oncology | Admitting: Hematology & Oncology

## 2015-06-19 ENCOUNTER — Encounter (HOSPITAL_COMMUNITY): Payer: Self-pay | Admitting: Hematology & Oncology

## 2015-06-19 VITALS — BP 139/67 | HR 63 | Temp 98.1°F | Resp 16

## 2015-06-19 DIAGNOSIS — C9000 Multiple myeloma not having achieved remission: Secondary | ICD-10-CM | POA: Insufficient documentation

## 2015-06-19 DIAGNOSIS — N189 Chronic kidney disease, unspecified: Secondary | ICD-10-CM | POA: Insufficient documentation

## 2015-06-19 NOTE — Patient Instructions (Addendum)
[place] at [facility] Discharge Instructions  RECOMMENDATIONS MADE BY THE CONSULTANT AND ANY TEST RESULTS WILL BE SENT TO YOUR REFERRING PHYSICIAN.  Exam and discussion today with Dr. Whitney Muse. Return in 6 weeks for lab work. Lab work and office visit in 3 months.  Thank you for choosing [place] at [facility] to provide your oncology and hematology care.  To afford each patient quality time with our provider, please arrive at least 15 minutes before your scheduled appointment time.    You need to re-schedule your appointment should you arrive 10 or more minutes late.  We strive to give you quality time with our providers, and arriving late affects you and other patients whose appointments are after yours.  Also, if you no show three or more times for appointments you may be dismissed from the clinic at the providers discretion.     Again, thank you for choosing [facility].  Our hope is that these requests will decrease the amount of time that you wait before being seen by our physicians.       _____________________________________________________________  Should you have questions after your visit to [facility], please contact our office at [phone] between the hours of 8:30 a.m. and 4:30 p.m.  Voicemails left after 4:30 p.m. will not be returned until the following business day.  For prescription refill requests, have your pharmacy contact our office.

## 2015-06-19 NOTE — Progress Notes (Signed)
Melissa Nakayama, MD [address] [place] [address]   Multiple myeloma, IgG kappa, BMBX with 20% plasma cells Had a stroke last year 09/18/2014, Left MCA territory. Aphasia, dysphagia, R sided hemiparesis History of R nephrectomy from [facility] 11/2007  CURRENT THERAPY: Observation  INTERVAL HISTORY: Melissa [name] 69 y.o. 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 BMBX showed 20% plasma cells. She was treated with RVD which was started on 09/05/2014. She only received cycle #1 of therapy. She presented to the ED at the end of September 2015 with altered mental status and ultimately diagnosed with a CVA.   Her daughters speak on her behalf and state that she has been doing well.  She has recently celebrated her 46th birthday where she smiled and chuckled. Recently started on Cymbalta   She complains of her right arm and wrist pain. (noted by grimacing with exercising the RUE) They have noticed no other changes. They are here to review laboratory studies in regards to the patients myeloma.     Multiple myeloma without remission   08/15/2014 Bone Marrow Biopsy Bone Marrow, Aspirate,Biopsy, and Clot, right iliac - HYPERCELLULAR BONE MARROW FOR AGE WITH TRILINEAGE HEMATOPOIESIS. - PLASMACYTOSIS (PLASMA CELLS 20%).   09/05/2014 -  Chemotherapy Revlimid (25 mg days 1-14 every 21 days), Velcade (D1, 4, 8, and 11 every 21 days) and dexamethasone   09/18/2014 [facility] Admission CVA     Past Medical History  Diagnosis Date  . Vertigo, intermittent   . Hypothyroidism   . Hyperlipidemia   . Obesity   . Hypertension     severe and resistant to treatment; 2008-negative left renal angiogram  . Vitiligo   . Degenerative disc disease, lumbar   . Degenerative disc disease, cervical   . Chest pain 2008    2008-normal coronary angiography  . 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 Renal cell carcinoma     Right  nephrectomy  . Multiple myeloma 08/26/2014    has Hypothyroid; Vitamin D deficiency; Obesity; Depression; HEARING LOSS; VERTIGO, INTERMITTENT; Vitiligo; Hyperlipidemia; Hypertension; History of kidney cancer; Anemia; Pulmonary fibrosis; Lung nodules; Positive ANA (antinuclear antibody); Knee pain, right; Radicular low back pain; Difficulty walking; Bilateral leg weakness; Bence Jones proteinuria; Multiple myeloma without remission; CVA (cerebral infarction); Thrombocytopenia, unspecified; CKD (chronic kidney disease) stage 3, GFR 30-59 ml/min; Dysphagia due to recent stroke; Pain in right elbow; Medicare annual wellness visit, subsequent; Need for vaccination with 13-polyvalent pneumococcal conjugate vaccine; Thyroid nodule; and Chronic ischemic left MCA stroke on her problem list.     is allergic to morphine.  Ms. Shedlock had no medications administered during this visit.  Past Surgical History  Procedure Laterality Date  . Tubal ligation  1983    Bilateral  . Nephrectomy      Right; secondary to renal cell carcinoma  . Colonoscopy N/A 04/21/2014    Procedure: COLONOSCOPY;  Surgeon: Rogene Houston, MD;  Location: [facility]O [facility];  Service: Endoscopy;  Laterality: N/A;  . Colonoscopy N/A 04/21/2014    Procedure: COLONOSCOPY;  Surgeon: Rogene Houston, MD;  Location: [facility]O [facility];  Service: Endoscopy;  Laterality: N/A;  1030  . Tee without cardioversion N/A 09/23/2014    Procedure: TRANSESOPHAGEAL ECHOCARDIOGRAM (TEE);  Surgeon: Sanda Klein, MD;  Location: [facility] ENDOSCOPY;  Service: Cardiovascular;  Laterality: N/A;    She has 7 grandchildren and 1 great-grandchild.  Denies any headaches, dizziness, double  vision, fevers, chills, night sweats, nausea, vomiting, diarrhea, constipation, chest pain, heart palpitations, shortness of breath, blood in stool, black tarry stool, urinary pain, urinary burning, urinary frequency, hematuria. Positive for loss of appetite. Positive for paralysis of right  extremities. Aphasia, depression (although mood is reported as improved)    PHYSICAL EXAMINATION  ECOG PERFORMANCE STATUS: 0 - Asymptomatic  Filed Vitals:   06/19/15 1140  BP: 139/67  Pulse: 63  Temp: 98.1 F (36.7 C)  Resp: 16    GENERAL:alert, no distress, well nourished, well developed, sitting in wheelchair. R sided paralysis obvious SKIN: skin color, texture, turgor are normal, no rashes or significant lesions HEAD: Normocephalic, No masses, lesions, tenderness or abnormalities EYES: normal, PERRLA, EOMI, Conjunctiva are pink and non-injected EARS: External ears normal OROPHARYNX:mucous membranes are moist  NECK: supple, no adenopathy, trachea midline LYMPH:  no palpable lymphadenopathy, no hepatosplenomegaly BREAST:not examined LUNGS: clear to auscultation  HEART: regular rate & rhythm ABDOMEN:abdomen soft, non-tender, obese and normal bowel sounds. Exam is limited secondary to patient sitting in wheelchair BACK: Back symmetric, no curvature., No CVA tenderness EXTREMITIES: Paralysis in right extremities. NEURO: alert, pleasant. Not conversant. R sided paralysis     LABORATORY DATA: CBC    Component Value Date/Time   WBC 10.6* 05/08/2015 1145   RBC 4.23 05/08/2015 1145   RBC 3.70* 07/31/2011 1245   HGB 13.0 05/08/2015 1145   HCT 37.1 05/08/2015 1145   PLT 306 05/08/2015 1145   MCV 87.7 05/08/2015 1145   MCH 30.7 05/08/2015 1145   MCHC 35.0 05/08/2015 1145   RDW 16.2* 05/08/2015 1145   LYMPHSABS 1.9 05/08/2015 1145   MONOABS 0.5 05/08/2015 1145   EOSABS 0.1 05/08/2015 1145   BASOSABS 0.0 05/08/2015 1145      Chemistry      Component Value Date/Time   NA 141 05/08/2015 1145   K 3.8 05/08/2015 1145   CL 104 05/08/2015 1145   CO2 30 05/08/2015 1145   BUN 11 05/08/2015 1145   CREATININE 0.84 05/08/2015 1145   CREATININE 0.68 04/03/2015 1122      Component Value Date/Time   CALCIUM 10.2 05/08/2015 1145   ALKPHOS 54 05/08/2015 1145   AST 22 05/08/2015  1145   ALT 21 05/08/2015 1145   BILITOT 0.6 05/08/2015 1145     Results for ENDYA, AUSTIN (MRN [id number]) as of 06/19/2015 16:30  Ref. Range 05/08/2015 11:45  Total Protein ELP Latest Ref Range: 6.0-8.5 g/dL 7.9  Albumin ELP Latest Ref Range: 3.2-5.6 g/dL 3.0 (L)  Globulin, Total Latest Ref Range: 2.0-4.5 g/dL 4.9 (H)  Alpha-1-Globulin Latest Ref Range: 0.1-0.4 g/dL 0.4  Alpha-2-Globulin Latest Ref Range: 0.4-1.2 g/dL 1.0  Beta Globulin Latest Ref Range: 0.6-1.3 g/dL 1.1  Gamma Globulin Latest Ref Range: 0.5-1.6 g/dL 2.4 (H)  M-SPIKE, % Latest Ref Range: Not Observed g/dL 2.1 (H)  SPE Interp. Unknown Comment  Comment Unknown Comment  IgG (Immunoglobin G), Serum Latest Ref Range: 408-512-8465 mg/dL 2972 (H)  IgA Latest Ref Range: 87-352 mg/dL 216  IgM, Serum Latest Ref Range: 26-217 mg/dL 52  Kappa free light chain Latest Ref Range: 3.30-19.40 mg/L 78.61 (H)  Lamda free light chains Latest Ref Range: 5.71-26.30 mg/L 31.55 (H)  Kappa, lamda light chain ratio Latest Ref Range: 0.26-1.65  2.49 (H)   Patient Active Problem List   Diagnosis Date Noted  . Thyroid nodule 04/21/2015  . Chronic ischemic left MCA stroke 04/21/2015  . Pain in right elbow 04/03/2015  . Medicare annual wellness  visit, subsequent 04/03/2015  . Need for vaccination with 13-polyvalent pneumococcal conjugate vaccine 04/03/2015  . Dysphagia due to recent stroke 11/10/2014  . CKD (chronic kidney disease) stage 3, GFR 30-59 ml/min 09/19/2014  . CVA (cerebral infarction) 09/18/2014  . Thrombocytopenia, unspecified 09/18/2014  . Multiple myeloma without remission 08/29/2014  . 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 Radicular low back pain 04/18/2014  . Difficulty walking 04/18/2014  . Bilateral leg weakness 04/18/2014  . Positive ANA (antinuclear antibody) 03/22/2014  . Knee pain, right 03/22/2014  . Lung nodules 08/02/2013  . Pulmonary fibrosis 03/29/2013  . Anemia 08/13/2012  . Hyperlipidemia   . Hypertension    . History of kidney cancer   . Vitiligo 04/01/2011  . HEARING LOSS 07/18/2010  . Depression 05/18/2010  . Vitamin D deficiency 10/24/2009  . Hypothyroid 03/31/2008  . Obesity 03/31/2008  . VERTIGO, INTERMITTENT 03/31/2008      ASSESSMENT/ PLAN:  1. 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 2. Chronic kidney disease.  3. History of right renal cell carcinoma, status post resection with no postop therapy, surgery performed in December of 2008, no evidence of disease.  4. R sided CVA with paralysis and aphasia.  5. Patent foramen ovale  I reviewed the patient's blood work with her two daughters in detail. She still has evidence of an M spike but a normal CBC and normal kidney function. Prior myeloma survey was normal. We did not repeat this. I advised him that her M spike level is stable to slightly decreased. There is currently no evidence of end organ dysfunction.  Given her stroke and paralysis at this point I would recommend observation only. I discussed indications for treatment including worsening blood counts, hypercalcemia, worsening renal function. I recommended rechecking her labs in 6 weeks and formal follow-up in 3 months. They feel very comfortable with the plan.  She has recently been started on Cymbalta by Dr. Merlene Laughter. Her daughters feel that her mood is improved.  All questions were answered. The patient knows to call the clinic with any problems, questions or concerns. We can certainly see the patient much sooner if necessary.    This document serves as a record of services personally performed by Ancil Linsey, MD. It was created on her behalf by Janace Hoard, a trained medical scribe. The creation of this record is based on the scribe's personal observations and the provider's statements to them. This document has been checked and approved by the attending provider.  I have reviewed the above documentation for accuracy and completeness, and I  agree with the above.  Kelby Fam. Whitney Muse, MD

## 2015-06-22 ENCOUNTER — Ambulatory Visit (INDEPENDENT_AMBULATORY_CARE_PROVIDER_SITE_OTHER): Payer: Medicare Other | Admitting: Otolaryngology

## 2015-06-22 DIAGNOSIS — I6900 Unspecified sequelae of nontraumatic subarachnoid hemorrhage: Secondary | ICD-10-CM | POA: Diagnosis not present

## 2015-06-22 DIAGNOSIS — I63032 Cerebral infarction due to thrombosis of left carotid artery: Secondary | ICD-10-CM | POA: Diagnosis not present

## 2015-06-23 DIAGNOSIS — I639 Cerebral infarction, unspecified: Secondary | ICD-10-CM

## 2015-06-23 HISTORY — DX: Cerebral infarction, unspecified: I63.9

## 2015-06-28 ENCOUNTER — Telehealth: Payer: Self-pay

## 2015-06-28 ENCOUNTER — Other Ambulatory Visit: Payer: Self-pay | Admitting: Family Medicine

## 2015-06-28 ENCOUNTER — Telehealth: Payer: Self-pay | Admitting: Family Medicine

## 2015-06-28 NOTE — Assessment & Plan Note (Signed)
Compromised  And inadequate oral intake due to stroke, pt to be maintained on supplemental tube feeds

## 2015-06-28 NOTE — Progress Notes (Addendum)
Subjective:    Patient ID: Melissa [name], female    DOB: 1946-11-26, 69 y.o.   MRN: [id number]  HPI Family has called in on 06/28/2015, requesting assistance in getting the tube feeds that their Mom needs. Having suffered a major stroke in 2015, her swallowing is compromised , as is her mental capacity to follow through on what she needs to do to ensure adequate nutrition and best possible health  I have every reason to believe that although she is permitted to eat, her oral  intake is inadequate and that she does require supplemental tube feeding. This is an addendum to her last visit , justifying on medical grounds her need for tube feeds   Review of Systems     Objective:   Physical Exam        Assessment & Plan:  Medicare annual wellness visit, subsequent Annual exam as documented. Counseling done  re healthy lifestyle involving commitment to 150 minutes exercise per week, heart healthy diet, and attaining healthy weight.The importance of adequate sleep also discussed. Regular seat belt use and home safety, is also discussed. Changes in health habits are decided on by the patient with goals and time frames  set for achieving them. Immunization and cancer screening needs are specifically addressed at this visit.   Need for vaccination with 13-polyvalent pneumococcal conjugate vaccine After obtaining informed consent, the vaccine is  administered by LPN.   Thyroid nodule Enlarging thyroid nodule noted in 2015, however, pt had a massive CVA and has been out of health car system for some time in recovery attempt. Family requests ENT referral to address this issue   Multiple myeloma without remission Request by family to establish with local oncologist due to transportation issues. Will refer, for local care  Dysphagia due to recent stroke Compromised  And inadequate oral intake due to stroke, pt to be maintained on supplemental tube feeds

## 2015-06-28 NOTE — Telephone Encounter (Signed)
I can addend, her previous note, will do this today

## 2015-06-28 NOTE — Telephone Encounter (Signed)
Noted  

## 2015-06-28 NOTE — Telephone Encounter (Signed)
Note of 4/11 has been add ended hopefully this should be adequate to get her the tube feeds, pls send and let family know

## 2015-06-29 DIAGNOSIS — I63032 Cerebral infarction due to thrombosis of left carotid artery: Secondary | ICD-10-CM | POA: Diagnosis not present

## 2015-06-29 DIAGNOSIS — R1311 Dysphagia, oral phase: Secondary | ICD-10-CM | POA: Diagnosis not present

## 2015-06-29 DIAGNOSIS — M6281 Muscle weakness (generalized): Secondary | ICD-10-CM | POA: Diagnosis not present

## 2015-06-29 NOTE — Telephone Encounter (Signed)
Noted and [facility]ed [facility] fax # [phone]

## 2015-07-10 ENCOUNTER — Other Ambulatory Visit: Payer: Self-pay | Admitting: Family Medicine

## 2015-07-10 DIAGNOSIS — R1311 Dysphagia, oral phase: Secondary | ICD-10-CM | POA: Diagnosis not present

## 2015-07-10 DIAGNOSIS — I63032 Cerebral infarction due to thrombosis of left carotid artery: Secondary | ICD-10-CM | POA: Diagnosis not present

## 2015-07-10 DIAGNOSIS — M6281 Muscle weakness (generalized): Secondary | ICD-10-CM | POA: Diagnosis not present

## 2015-07-19 ENCOUNTER — Other Ambulatory Visit: Payer: Self-pay | Admitting: *Deleted

## 2015-07-19 DIAGNOSIS — M25561 Pain in right knee: Secondary | ICD-10-CM

## 2015-07-22 DIAGNOSIS — I63032 Cerebral infarction due to thrombosis of left carotid artery: Secondary | ICD-10-CM | POA: Diagnosis not present

## 2015-07-22 DIAGNOSIS — I6900 Unspecified sequelae of nontraumatic subarachnoid hemorrhage: Secondary | ICD-10-CM | POA: Diagnosis not present

## 2015-07-24 ENCOUNTER — Encounter (HOSPITAL_COMMUNITY): Payer: Self-pay | Admitting: Emergency Medicine

## 2015-07-24 ENCOUNTER — Emergency Department (HOSPITAL_COMMUNITY): Payer: Medicare Other

## 2015-07-24 ENCOUNTER — Inpatient Hospital Stay (HOSPITAL_COMMUNITY): Payer: Medicare Other

## 2015-07-24 ENCOUNTER — Inpatient Hospital Stay (HOSPITAL_COMMUNITY)
Admission: EM | Admit: 2015-07-24 | Discharge: 2015-08-03 | DRG: 101 | Disposition: A | Discharge status: Skilled Nursing Facility | Payer: Medicare Other | Attending: Internal Medicine | Admitting: Internal Medicine

## 2015-07-24 DIAGNOSIS — R7881 Bacteremia: Secondary | ICD-10-CM | POA: Diagnosis not present

## 2015-07-24 DIAGNOSIS — I639 Cerebral infarction, unspecified: Secondary | ICD-10-CM

## 2015-07-24 DIAGNOSIS — Z7951 Long term (current) use of inhaled steroids: Secondary | ICD-10-CM | POA: Diagnosis not present

## 2015-07-24 DIAGNOSIS — R509 Fever, unspecified: Secondary | ICD-10-CM | POA: Diagnosis not present

## 2015-07-24 DIAGNOSIS — R42 Dizziness and giddiness: Secondary | ICD-10-CM

## 2015-07-24 DIAGNOSIS — M25552 Pain in left hip: Secondary | ICD-10-CM | POA: Diagnosis not present

## 2015-07-24 DIAGNOSIS — F32A Depression, unspecified: Secondary | ICD-10-CM | POA: Diagnosis present

## 2015-07-24 DIAGNOSIS — Z66 Do not resuscitate: Secondary | ICD-10-CM | POA: Diagnosis present

## 2015-07-24 DIAGNOSIS — M6281 Muscle weakness (generalized): Secondary | ICD-10-CM | POA: Diagnosis not present

## 2015-07-24 DIAGNOSIS — M1612 Unilateral primary osteoarthritis, left hip: Secondary | ICD-10-CM | POA: Diagnosis not present

## 2015-07-24 DIAGNOSIS — Z931 Gastrostomy status: Secondary | ICD-10-CM

## 2015-07-24 DIAGNOSIS — E039 Hypothyroidism, unspecified: Secondary | ICD-10-CM | POA: Diagnosis present

## 2015-07-24 DIAGNOSIS — M25521 Pain in right elbow: Secondary | ICD-10-CM | POA: Diagnosis not present

## 2015-07-24 DIAGNOSIS — R4701 Aphasia: Secondary | ICD-10-CM | POA: Diagnosis not present

## 2015-07-24 DIAGNOSIS — D649 Anemia, unspecified: Secondary | ICD-10-CM | POA: Diagnosis present

## 2015-07-24 DIAGNOSIS — Z6831 Body mass index (BMI) 31.0-31.9, adult: Secondary | ICD-10-CM | POA: Diagnosis not present

## 2015-07-24 DIAGNOSIS — N183 Chronic kidney disease, stage 3 unspecified: Secondary | ICD-10-CM | POA: Diagnosis present

## 2015-07-24 DIAGNOSIS — I69320 Aphasia following cerebral infarction: Secondary | ICD-10-CM

## 2015-07-24 DIAGNOSIS — F339 Major depressive disorder, recurrent, unspecified: Secondary | ICD-10-CM | POA: Diagnosis not present

## 2015-07-24 DIAGNOSIS — Z79899 Other long term (current) drug therapy: Secondary | ICD-10-CM | POA: Diagnosis not present

## 2015-07-24 DIAGNOSIS — B958 Unspecified staphylococcus as the cause of diseases classified elsewhere: Secondary | ICD-10-CM | POA: Diagnosis not present

## 2015-07-24 DIAGNOSIS — E46 Unspecified protein-calorie malnutrition: Secondary | ICD-10-CM | POA: Diagnosis present

## 2015-07-24 DIAGNOSIS — A0472 Enterocolitis due to Clostridium difficile, not specified as recurrent: Secondary | ICD-10-CM | POA: Diagnosis not present

## 2015-07-24 DIAGNOSIS — I129 Hypertensive chronic kidney disease with stage 1 through stage 4 chronic kidney disease, or unspecified chronic kidney disease: Secondary | ICD-10-CM | POA: Diagnosis present

## 2015-07-24 DIAGNOSIS — E785 Hyperlipidemia, unspecified: Secondary | ICD-10-CM | POA: Diagnosis present

## 2015-07-24 DIAGNOSIS — A047 Enterocolitis due to Clostridium difficile: Secondary | ICD-10-CM | POA: Diagnosis not present

## 2015-07-24 DIAGNOSIS — M21371 Foot drop, right foot: Secondary | ICD-10-CM | POA: Diagnosis present

## 2015-07-24 DIAGNOSIS — J9811 Atelectasis: Secondary | ICD-10-CM | POA: Diagnosis not present

## 2015-07-24 DIAGNOSIS — G40109 Localization-related (focal) (partial) symptomatic epilepsy and epileptic syndromes with simple partial seizures, not intractable, without status epilepticus: Secondary | ICD-10-CM | POA: Diagnosis not present

## 2015-07-24 DIAGNOSIS — R253 Fasciculation: Secondary | ICD-10-CM | POA: Diagnosis not present

## 2015-07-24 DIAGNOSIS — I69351 Hemiplegia and hemiparesis following cerebral infarction affecting right dominant side: Secondary | ICD-10-CM

## 2015-07-24 DIAGNOSIS — G8191 Hemiplegia, unspecified affecting right dominant side: Secondary | ICD-10-CM

## 2015-07-24 DIAGNOSIS — N390 Urinary tract infection, site not specified: Secondary | ICD-10-CM | POA: Diagnosis present

## 2015-07-24 DIAGNOSIS — Z905 Acquired absence of kidney: Secondary | ICD-10-CM | POA: Diagnosis present

## 2015-07-24 DIAGNOSIS — R2689 Other abnormalities of gait and mobility: Secondary | ICD-10-CM | POA: Diagnosis not present

## 2015-07-24 DIAGNOSIS — I519 Heart disease, unspecified: Secondary | ICD-10-CM | POA: Diagnosis not present

## 2015-07-24 DIAGNOSIS — R1312 Dysphagia, oropharyngeal phase: Secondary | ICD-10-CM | POA: Diagnosis not present

## 2015-07-24 DIAGNOSIS — R569 Unspecified convulsions: Secondary | ICD-10-CM | POA: Diagnosis not present

## 2015-07-24 DIAGNOSIS — Z823 Family history of stroke: Secondary | ICD-10-CM

## 2015-07-24 DIAGNOSIS — Z7401 Bed confinement status: Secondary | ICD-10-CM | POA: Diagnosis not present

## 2015-07-24 DIAGNOSIS — Z8249 Family history of ischemic heart disease and other diseases of the circulatory system: Secondary | ICD-10-CM | POA: Diagnosis not present

## 2015-07-24 DIAGNOSIS — M25421 Effusion, right elbow: Secondary | ICD-10-CM | POA: Diagnosis not present

## 2015-07-24 DIAGNOSIS — E876 Hypokalemia: Secondary | ICD-10-CM | POA: Diagnosis not present

## 2015-07-24 DIAGNOSIS — J189 Pneumonia, unspecified organism: Secondary | ICD-10-CM | POA: Diagnosis present

## 2015-07-24 DIAGNOSIS — I6930 Unspecified sequelae of cerebral infarction: Secondary | ICD-10-CM

## 2015-07-24 DIAGNOSIS — B957 Other staphylococcus as the cause of diseases classified elsewhere: Secondary | ICD-10-CM | POA: Diagnosis present

## 2015-07-24 DIAGNOSIS — Z8673 Personal history of transient ischemic attack (TIA), and cerebral infarction without residual deficits: Secondary | ICD-10-CM

## 2015-07-24 DIAGNOSIS — I6350 Cerebral infarction due to unspecified occlusion or stenosis of unspecified cerebral artery: Secondary | ICD-10-CM | POA: Diagnosis not present

## 2015-07-24 DIAGNOSIS — Z85528 Personal history of other malignant neoplasm of kidney: Secondary | ICD-10-CM

## 2015-07-24 DIAGNOSIS — I517 Cardiomegaly: Secondary | ICD-10-CM | POA: Diagnosis not present

## 2015-07-24 DIAGNOSIS — I638 Other cerebral infarction: Secondary | ICD-10-CM | POA: Diagnosis not present

## 2015-07-24 DIAGNOSIS — G4089 Other seizures: Secondary | ICD-10-CM | POA: Diagnosis not present

## 2015-07-24 DIAGNOSIS — Z7982 Long term (current) use of aspirin: Secondary | ICD-10-CM

## 2015-07-24 DIAGNOSIS — N189 Chronic kidney disease, unspecified: Secondary | ICD-10-CM | POA: Diagnosis present

## 2015-07-24 DIAGNOSIS — N3000 Acute cystitis without hematuria: Secondary | ICD-10-CM | POA: Diagnosis not present

## 2015-07-24 DIAGNOSIS — R52 Pain, unspecified: Secondary | ICD-10-CM

## 2015-07-24 DIAGNOSIS — R41841 Cognitive communication deficit: Secondary | ICD-10-CM | POA: Diagnosis not present

## 2015-07-24 DIAGNOSIS — F329 Major depressive disorder, single episode, unspecified: Secondary | ICD-10-CM | POA: Diagnosis not present

## 2015-07-24 DIAGNOSIS — R1310 Dysphagia, unspecified: Secondary | ICD-10-CM | POA: Diagnosis not present

## 2015-07-24 DIAGNOSIS — C9000 Multiple myeloma not having achieved remission: Secondary | ICD-10-CM | POA: Diagnosis not present

## 2015-07-24 DIAGNOSIS — I5189 Other ill-defined heart diseases: Secondary | ICD-10-CM | POA: Diagnosis present

## 2015-07-24 DIAGNOSIS — I10 Essential (primary) hypertension: Secondary | ICD-10-CM | POA: Diagnosis present

## 2015-07-24 DIAGNOSIS — E038 Other specified hypothyroidism: Secondary | ICD-10-CM | POA: Diagnosis present

## 2015-07-24 DIAGNOSIS — I6789 Other cerebrovascular disease: Secondary | ICD-10-CM | POA: Diagnosis not present

## 2015-07-24 DIAGNOSIS — J181 Lobar pneumonia, unspecified organism: Secondary | ICD-10-CM | POA: Diagnosis not present

## 2015-07-24 DIAGNOSIS — E669 Obesity, unspecified: Secondary | ICD-10-CM | POA: Diagnosis present

## 2015-07-24 DIAGNOSIS — J984 Other disorders of lung: Secondary | ICD-10-CM | POA: Diagnosis not present

## 2015-07-24 DIAGNOSIS — I63412 Cerebral infarction due to embolism of left middle cerebral artery: Secondary | ICD-10-CM | POA: Diagnosis not present

## 2015-07-24 DIAGNOSIS — G40909 Epilepsy, unspecified, not intractable, without status epilepticus: Secondary | ICD-10-CM | POA: Diagnosis not present

## 2015-07-24 LAB — BASIC METABOLIC PANEL
Anion gap: 6 (ref 5–15)
BUN: 10 mg/dL (ref 6–20)
CO2: 29 mmol/L (ref 22–32)
Calcium: 9.7 mg/dL (ref 8.9–10.3)
Chloride: 105 mmol/L (ref 101–111)
Creatinine, Ser: 0.69 mg/dL (ref 0.44–1.00)
Glucose, Bld: 83 mg/dL (ref 65–99)
POTASSIUM: 3.7 mmol/L (ref 3.5–5.1)
SODIUM: 140 mmol/L (ref 135–145)

## 2015-07-24 LAB — URINALYSIS, ROUTINE W REFLEX MICROSCOPIC
Bilirubin Urine: NEGATIVE
Glucose, UA: NEGATIVE mg/dL
Hgb urine dipstick: NEGATIVE
KETONES UR: NEGATIVE mg/dL
LEUKOCYTES UA: NEGATIVE
Nitrite: NEGATIVE
Protein, ur: 30 mg/dL — AB
SPECIFIC GRAVITY, URINE: 1.011 (ref 1.005–1.030)
Urobilinogen, UA: 1 mg/dL (ref 0.0–1.0)
pH: 7 (ref 5.0–8.0)

## 2015-07-24 LAB — CBC
HEMATOCRIT: 36.3 % (ref 36.0–46.0)
Hemoglobin: 12.7 g/dL (ref 12.0–15.0)
MCH: 30.5 pg (ref 26.0–34.0)
MCHC: 35 g/dL (ref 30.0–36.0)
MCV: 87.3 fL (ref 78.0–100.0)
PLATELETS: 271 10*3/uL (ref 150–400)
RBC: 4.16 MIL/uL (ref 3.87–5.11)
RDW: 16.6 % — AB (ref 11.5–15.5)
WBC: 7.2 10*3/uL (ref 4.0–10.5)

## 2015-07-24 LAB — I-STAT TROPONIN, ED: Troponin i, poc: 0 ng/mL (ref 0.00–0.08)

## 2015-07-24 LAB — URINE MICROSCOPIC-ADD ON

## 2015-07-24 MED ORDER — LORAZEPAM 2 MG/ML IJ SOLN
1.0000 mg | INTRAMUSCULAR | Status: DC | PRN
  Administered 2015-07-24 – 2015-07-25 (×2): 1 mg via INTRAVENOUS
  Filled 2015-07-24 (×2): qty 1

## 2015-07-24 MED ORDER — SORBITOL 70 % SOLN
30.0000 mL | Freq: Every day | Status: DC
  Administered 2015-07-25 – 2015-07-31 (×7): 30 mL
  Filled 2015-07-24 (×8): qty 30

## 2015-07-24 MED ORDER — ONDANSETRON HCL 4 MG PO TABS: 4.0000 mg | ORAL_TABLET | Freq: Four times a day (QID) | ORAL | Status: DC | PRN

## 2015-07-24 MED ORDER — CLONIDINE HCL 0.3 MG/24HR TD PTWK
0.3000 mg | MEDICATED_PATCH | TRANSDERMAL | Status: DC
  Administered 2015-07-25 – 2015-08-01 (×2): 0.3 mg via TRANSDERMAL
  Filled 2015-07-24 (×2): qty 1

## 2015-07-24 MED ORDER — LEVETIRACETAM 100 MG/ML PO SOLN
500.0000 mg | Freq: Two times a day (BID) | ORAL | Status: DC
  Administered 2015-07-24 – 2015-07-25 (×2): 500 mg
  Filled 2015-07-24 (×3): qty 5

## 2015-07-24 MED ORDER — LEVETIRACETAM 500 MG PO TABS: 500.0000 mg | ORAL_TABLET | Freq: Two times a day (BID) | ORAL | Status: DC

## 2015-07-24 MED ORDER — SODIUM CHLORIDE 0.9 % IJ SOLN
3.0000 mL | Freq: Two times a day (BID) | INTRAMUSCULAR | Status: DC
  Administered 2015-07-25 – 2015-08-03 (×10): 3 mL via INTRAVENOUS

## 2015-07-24 MED ORDER — AMLODIPINE BESYLATE 10 MG PO TABS
10.0000 mg | ORAL_TABLET | Freq: Every morning | ORAL | Status: DC
  Administered 2015-07-25 – 2015-07-26 (×2): 10 mg
  Filled 2015-07-24 (×2): qty 1

## 2015-07-24 MED ORDER — ONDANSETRON HCL 4 MG/2ML IJ SOLN: 4.0000 mg | Freq: Four times a day (QID) | INTRAMUSCULAR | Status: DC | PRN

## 2015-07-24 MED ORDER — LEVOFLOXACIN IN D5W 500 MG/100ML IV SOLN
500.0000 mg | INTRAVENOUS | Status: DC
  Filled 2015-07-24: qty 100

## 2015-07-24 MED ORDER — CARVEDILOL 6.25 MG PO TABS
6.2500 mg | ORAL_TABLET | Freq: Two times a day (BID) | ORAL | Status: DC
  Administered 2015-07-24 – 2015-08-03 (×20): 6.25 mg
  Filled 2015-07-24 (×20): qty 1

## 2015-07-24 MED ORDER — SODIUM CHLORIDE 0.9 % IV SOLN
1000.0000 mg | Freq: Once | INTRAVENOUS | Status: AC
  Administered 2015-07-24: 1000 mg via INTRAVENOUS
  Filled 2015-07-24: qty 10

## 2015-07-24 MED ORDER — PRO-STAT SUGAR FREE PO LIQD
30.0000 mL | Freq: Every day | ORAL | Status: DC
  Administered 2015-07-24 – 2015-07-25 (×3): 30 mL via ORAL
  Filled 2015-07-24 (×3): qty 30

## 2015-07-24 MED ORDER — LEVOTHYROXINE SODIUM 50 MCG PO TABS
75.0000 ug | ORAL_TABLET | Freq: Every day | ORAL | Status: DC
  Administered 2015-07-25 – 2015-08-03 (×10): 75 ug
  Filled 2015-07-24 (×20): qty 1

## 2015-07-24 MED ORDER — FREE WATER
100.0000 mL | Status: DC
  Administered 2015-07-24 – 2015-08-03 (×59): 100 mL

## 2015-07-24 MED ORDER — POLYETHYLENE GLYCOL 3350 17 G PO PACK: 17.0000 g | PACK | Freq: Every day | ORAL | Status: DC | PRN

## 2015-07-24 MED ORDER — DULOXETINE HCL 30 MG PO CPEP
30.0000 mg | ORAL_CAPSULE | Freq: Two times a day (BID) | ORAL | Status: DC
  Administered 2015-07-24 – 2015-08-02 (×16): 30 mg via ORAL
  Filled 2015-07-24 (×19): qty 1

## 2015-07-24 MED ORDER — PRAVASTATIN SODIUM 40 MG PO TABS
40.0000 mg | ORAL_TABLET | Freq: Every day | ORAL | Status: DC
  Administered 2015-07-24 – 2015-08-02 (×10): 40 mg via ORAL
  Filled 2015-07-24 (×10): qty 1

## 2015-07-24 MED ORDER — LEVOFLOXACIN IN D5W 750 MG/150ML IV SOLN
750.0000 mg | Freq: Once | INTRAVENOUS | Status: AC
  Administered 2015-07-24: 750 mg via INTRAVENOUS
  Filled 2015-07-24: qty 150

## 2015-07-24 MED ORDER — ENOXAPARIN SODIUM 40 MG/0.4ML ~~LOC~~ SOLN
40.0000 mg | SUBCUTANEOUS | Status: DC
  Administered 2015-07-24 – 2015-08-02 (×10): 40 mg via SUBCUTANEOUS
  Filled 2015-07-24 (×10): qty 0.4

## 2015-07-24 MED ORDER — PROCHLORPERAZINE MALEATE 10 MG PO TABS
10.0000 mg | ORAL_TABLET | Freq: Four times a day (QID) | ORAL | Status: DC | PRN
  Filled 2015-07-24: qty 1

## 2015-07-24 MED ORDER — CLONAZEPAM 0.5 MG PO TABS: 0.2500 mg | ORAL_TABLET | Freq: Three times a day (TID) | ORAL | Status: DC | PRN

## 2015-07-24 MED ORDER — ASPIRIN 81 MG PO CHEW
162.0000 mg | CHEWABLE_TABLET | Freq: Every day | ORAL | Status: DC
  Administered 2015-07-25 – 2015-07-28 (×4): 162 mg
  Filled 2015-07-24 (×4): qty 2

## 2015-07-24 MED ORDER — HYDROCODONE-ACETAMINOPHEN 5-325 MG PO TABS
1.0000 | ORAL_TABLET | Freq: Two times a day (BID) | ORAL | Status: DC | PRN
  Administered 2015-07-27: 1
  Filled 2015-07-24: qty 1

## 2015-07-24 MED ORDER — LORAZEPAM 2 MG/ML IJ SOLN
1.0000 mg | Freq: Once | INTRAMUSCULAR | Status: AC
  Administered 2015-07-24: 1 mg via INTRAVENOUS
  Filled 2015-07-24: qty 1

## 2015-07-24 NOTE — ED Notes (Signed)
Pt has no hx of seizures. Pt's family states she started shaking all over, eyes rolled back in head. [facility] pt appears post-ictal. Pt has hx of CVA affecting right side and pt normally can only repeat a few works. Pt is bedbound and from home. SR HR 81, CBG 95, BP 170/86

## 2015-07-24 NOTE — Progress Notes (Signed)
Received from ER via stretcher; transferred to bed; patient is alert; nonverbal; oriented patient and family to room and unit routine.

## 2015-07-24 NOTE — ED Provider Notes (Signed)
CSN: [id number]     Arrival date & time 07/24/15  1300 History   First MD Initiated Contact with Patient 07/24/15 1306     Chief Complaint  Patient presents with  . Seizures     (Consider location/radiation/quality/duration/timing/severity/associated sxs/prior Treatment) Patient is a 69 y.o. female presenting with seizures. The history is provided by the patient.  Seizures Seizure activity on arrival: yes   Seizure type:  Focal Preceding symptoms: no dizziness and no hyperventilation   Initial focality:  Lower extremity Episode characteristics: abnormal movements (twitching of L leg) and confusion (usually verbal and converses, now staring off into space)   Severity:  Mild Duration:  3 minutes Timing:  Once Number of seizures this episode:  1 Progression:  Improving Context: not change in medication, not family hx of seizures, not fever, not pregnant and not previous head injury     Past Medical History  Diagnosis Date  . Vertigo, intermittent   . Hypothyroidism   . Hyperlipidemia   . Obesity   . Hypertension     severe and resistant to treatment; 2008-negative left renal angiogram  . Vitiligo   . Degenerative disc disease, lumbar   . Degenerative disc disease, cervical   . Chest pain 2008    2008-normal coronary angiography  . 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 Renal cell carcinoma     Right nephrectomy  . Multiple myeloma 08/26/2014   Past Surgical History  Procedure Laterality Date  . Tubal ligation  1983    Bilateral  . Nephrectomy      Right; secondary to renal cell carcinoma  . Colonoscopy N/A 04/21/2014    Procedure: COLONOSCOPY;  Surgeon: Malissa Hippo, MD;  Location: [facility];  Service: Endoscopy;  Laterality: N/A;  . Colonoscopy N/A 04/21/2014    Procedure: COLONOSCOPY;  Surgeon: Malissa Hippo, MD;  Location: [facility];  Service: Endoscopy;  Laterality: N/A;  1030  . Tee without cardioversion N/A 09/23/2014    Procedure: TRANSESOPHAGEAL  ECHOCARDIOGRAM (TEE);  Surgeon: Thurmon Fair, MD;  Location: [facility] ENDOSCOPY;  Service: Cardiovascular;  Laterality: N/A;   Family History  Problem Relation Age of Onset  . Heart disease Mother 75  . Hypertension Mother   . Colon cancer Father   . Hypertension Sister     x2  . Diabetes Sister     x2  . Heart failure Sister   . Lupus Brother    History  Substance Use Topics  . Smoking status: Never Smoker   . Smokeless tobacco: Never Used  . Alcohol Use: No   OB History    No data available     Review of Systems  Unable to perform ROS: Mental status change  Neurological: Positive for seizures.      Allergies  Morphine  Home Medications   Prior to Admission medications   Medication Sig Start Date End Date Taking? Authorizing Provider  albuterol (PROVENTIL) (2.5 MG/3ML) 0.083% nebulizer solution Take 3 mLs (2.5 mg total) by nebulization every 4 (four) hours as needed for wheezing or shortness of breath. Patient not taking: Reported on 06/19/2015 10/01/14   Ripudeep Jenna Luo, MD  Amino Acids-Protein Hydrolys (FEEDING SUPPLEMENT, PRO-STAT SUGAR FREE 64,) LIQD Take 30 mLs by mouth daily. Patient taking differently: Take 30 mLs by mouth 5 (five) times daily.  10/01/14   Ripudeep Jenna Luo, MD  amLODipine (NORVASC) 10 MG tablet Place 1 tablet (10 mg total) into feeding tube every morning. 10/01/14  Ripudeep Krystal Eaton, MD  aspirin 81 MG chewable tablet Chew 162 mg by mouth daily.     Historical Provider, MD  budesonide-formoterol (SYMBICORT) 160-4.5 MCG/ACT inhaler Inhale 2 puffs into the lungs daily as needed (Shortness Of Breath).     Historical Provider, MD  carvedilol (COREG) 6.25 MG tablet TAKE ONE TABLET INTO FEEDING TUBE TWICE DAILY WITH MEALS 06/29/15   Fayrene Helper, MD  Cholecalciferol (VITAMIN D3) 50000 UNITS TABS Take 1 tablet by mouth once a week. By feeding tube 04/05/15   Fayrene Helper, MD  clonazePAM Bobbye [place]) 0.5 MG tablet One tablet three times daily, every 8 hours  for anxiety andf agitation 12/21/14   Fayrene Helper, MD  cloNIDine (CATAPRES - DOSED IN MG/24 HR) 0.3 mg/24hr patch APPLY 1 PATCH TOPICALLY ONCE A WEEK 07/11/15   Fayrene Helper, MD  dicyclomine (BENTYL) 10 MG capsule Take 1 capsule (10 mg total) by mouth 2 (two) times daily as needed for spasms. Patient not taking: Reported on 05/08/2015 04/13/15   Fayrene Helper, MD  DULoxetine (CYMBALTA) 30 MG capsule Take 30 mg by mouth 2 (two) times daily. 05/23/15   Historical Provider, MD  FLUoxetine (PROZAC) 10 MG capsule Take 10 mg by mouth daily. Pt is being weaned off this medication per instruction of Dr. Merlene Laughter    Historical Provider, MD  fluticasone [facility]) 50 MCG/ACT nasal spray Place 2 sprays into both nostrils daily. Patient not taking: Reported on 05/08/2015 08/26/14   Farrel Gobble, MD  gabapentin (NEURONTIN) 300 MG capsule TAKE ONE CAPSULE BY MOUTH TWICE DAILY 05/16/15   Fayrene Helper, MD  HYDROcodone-acetaminophen (NORCO/VICODIN) 5-325 MG per tablet Take 1 tablet by mouth 2 (two) times daily as needed for moderate pain. 04/14/15   Fayrene Helper, MD  Ketamine HCl POWD Apply 1 application topically 3 (three) times daily as needed. 04/25/15   Historical Provider, MD  levothyroxine (SYNTHROID, LEVOTHROID) 75 MCG tablet One tablet mon-fri and one half tab on sat and sun 04/05/15   Fayrene Helper, MD  lovastatin (MEVACOR) 40 MG tablet Place 1 tablet (40 mg total) into feeding tube at bedtime. 10/01/14   Ripudeep Krystal Eaton, MD  lovastatin (MEVACOR) 40 MG tablet TAKE ONE TABLET BY MOUTH ONCE DAILY WITH BREAKFAST 07/11/15   Fayrene Helper, MD  meclizine (ANTIVERT) 25 MG tablet Place 25 mg into feeding tube 3 (three) times daily as needed for dizziness.     Historical Provider, MD  Multiple Vitamins-Minerals (MULTIVITAMIN WITH MINERALS) tablet Take 1 tablet by mouth daily.    Historical Provider, MD  pantoprazole sodium (PROTONIX) 40 mg/20 mL PACK Place 20 mLs (40 mg total) into  feeding tube 2 (two) times daily. Patient not taking: Reported on 05/08/2015 10/01/14   Ripudeep Krystal Eaton, MD  polyethylene glycol (MIRALAX / GLYCOLAX) packet Take 17 g by mouth daily as needed for mild constipation.     Historical Provider, MD  prochlorperazine (COMPAZINE) 10 MG tablet Take 10 mg by mouth every 6 (six) hours as needed for nausea or vomiting.    Historical Provider, MD  Water For Irrigation, Sterile (FREE WATER) SOLN Place 100 mLs into feeding tube every 4 (four) hours. 10/01/14   Ripudeep K Rai, MD   BP 147/78 mmHg  Pulse 68  Temp(Src) 99 F (37.2 C) (Oral)  Resp 14  Ht $R'5\' 6"'RF$  (1.676 m)  SpO2 100% Physical Exam  Constitutional: She appears well-developed and well-nourished. No distress.  HENT:  Head: Normocephalic and  atraumatic.  Mouth/Throat: Oropharynx is clear and moist.  Eyes: EOM are normal. Pupils are equal, round, and reactive to light.  Neck: Normal range of motion. Neck supple.  Cardiovascular: Normal rate and regular rhythm.  Exam reveals no friction rub.   No murmur heard. Pulmonary/Chest: Effort normal and breath sounds normal. No respiratory distress. She has no wheezes. She has no rales.  Abdominal: Soft. She exhibits no distension. There is no tenderness. There is no rebound.  Musculoskeletal: Normal range of motion. She exhibits no edema.  Neurological: She is alert.  Nonverbal Does not track Does not follow commands L leg is twitching irregularly.  Skin: No rash noted. She is not diaphoretic.  Nursing note and vitals reviewed.   ED Course  Procedures (including critical care time) Labs Review Labs Reviewed  CBC - Abnormal; Notable for the following:    RDW 16.6 (*)    All other components within normal limits  URINALYSIS, ROUTINE W REFLEX MICROSCOPIC (NOT AT [facility]) - Abnormal; Notable for the following:    APPearance CLOUDY (*)    Protein, ur 30 (*)    All other components within normal limits  URINE MICROSCOPIC-ADD ON - Abnormal; Notable for  the following:    Bacteria, UA MANY (*)    All other components within normal limits  CULTURE, BLOOD (ROUTINE X 2)  CULTURE, BLOOD (ROUTINE X 2)  BASIC METABOLIC PANEL  I-STAT TROPOININ, ED    Imaging Review Dg Chest 1 View  07/24/2015   CLINICAL DATA:  Muscle twitching.  EXAM: CHEST  1 VIEW  COMPARISON:  09/28/2014.  FINDINGS: Stable enlarged cardiac silhouette. Left lower lobe opacity. Mild linear density at the right lung base. Thoracic spine degenerative changes.  IMPRESSION: 1. Left lower lobe atelectasis or pneumonia. 2. Mild right basilar atelectasis. 3. Stable cardiomegaly.   Electronically Signed   By: Claudie Revering M.D.   On: 07/24/2015 13:50   Ct Head Wo Contrast  07/24/2015   CLINICAL DATA:  New onset seizure  EXAM: CT HEAD WITHOUT CONTRAST  TECHNIQUE: Contiguous axial images were obtained from the base of the skull through the vertex without intravenous contrast.  COMPARISON:  09/29/2014  FINDINGS: No skull fracture is noted. No intracranial hemorrhage, mass effect or midline shift. There is old infarct in left MCA territory. No acute cortical infarction. No mass lesion is noted on this unenhanced scan. Mild cerebral atrophy. Mild periventricular white matter decreased attenuation probable due to chronic small vessel ischemic changes. Stable old infarcts in right frontal lobe and right cerebellum.  IMPRESSION: No acute intracranial abnormality. Stable bilateral old infarcts as described above. Stable mild cerebral atrophy.   Electronically Signed   By: Lahoma Crocker M.D.   On: 07/24/2015 14:19     EKG Interpretation   Date/Time:  Monday July 24 2015 13:04:07 EDT Ventricular Rate:  80 PR Interval:  160 QRS Duration: 96 QT Interval:  405 QTC Calculation: 467 R Axis:   73 Text Interpretation:  Sinus rhythm Multiple ventricular premature  complexes Probable left atrial enlargement No significant change since  last tracing Confirmed by Mingo Amber  MD, Maddelynn Moosman ([id number]) on 07/24/2015 1:30:11  PM      MDM   Final diagnoses:  Muscle twitching  Partial seizure    65F here with a seizure. Patient has hx of CVA, no hx of seizure. Lives with daughter who noticed L leg shaking. Associated confusion. Intermittent rhythmic jerking of her left leg, no full blown grand mal seizure. She is nonverbal.  At baseline, she is verbal and converses with everyone. AFVSS here. Given ativan to help stop her seizure. Loaded with Keppra. CT here shows stable infarcts. Workup yields possible pneumonia in LLL, however no white count, no hypoxia. I spoke with Dr. Janann Colonel of Neuro, who stated any infection could trigger a seizure.  Patient is returning to baseline. Family does not feel comfortable taking her home tonight on a new seizure medication.  Placed on levaquin to cover for UTI and pneumonia. Admitted to medicine.    Evelina Bucy, MD 07/24/15 [phone]

## 2015-07-24 NOTE — H&P (Signed)
[facility] History and Physical                                                                                    Melissa [name], is a 69 y.o. female  MRN: [id number]   DOB - March 07, 1946  Admit Date - 07/24/2015  Outpatient Primary MD for the patient is [name], MD  Referring Physician:  Dr. Gwendolyn Grant  Chief Complaint:   Chief Complaint  Patient presents with  . Seizures     HPI  Melissa [name]  is a 69 y.o. female, with multiple myeloma, right sided hemiparesis from previous stroke, dysphagia status post PEG tube, who lives at home with her daughters, is bedbound, and speaks a few words at a time at baseline. She presents to the emergency department with left-sided seizure. Per her primary caretaker [name], the patient has been feeling well recently aside from left hip and leg pain thought to be due to sciatica. This morning at approximately 8 AM [name] noticed her mother's left lower extremity twitching. Within about 30 minutes the twitching grew stronger and her left upper extremity began twitching as well. Her mother became unresponsive. And turned her face towards the right. After approximately an hour the twitching decreased in severity and her mother became a little bit more responsive. In the emergency department, UA is positive for many bacteria, CT head is negative for acute changes, chest x-ray shows atelectasis versus left lower lobe pneumonia.  Review of Systems   The patient is not speaking and unable to give a review of systems. Per her primary caretaker the patient's stools have been hard (which is abnormal). She has been taking her PEG feeds without residual. She also eats some pured foods and sweets orally. She has not noticed coughing there have been no complaints of pain other than her left hip pain which has become severe in the past 2 weeks. [name] has noticed no odor or discoloration to the patient's urine  Past Medical History  Past Medical History   Diagnosis Date  . Vertigo, intermittent   . Hypothyroidism   . Hyperlipidemia   . Obesity   . Hypertension     severe and resistant to treatment; 2008-negative left renal angiogram  . Vitiligo   . Degenerative disc disease, lumbar   . Degenerative disc disease, cervical   . Chest pain 2008    2008-normal coronary angiography  . 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 Renal cell carcinoma     Right nephrectomy  . Multiple myeloma 08/26/2014    Past Surgical History  Procedure Laterality Date  . Tubal ligation  1983    Bilateral  . Nephrectomy      Right; secondary to renal cell carcinoma  . Colonoscopy N/A 04/21/2014    Procedure: COLONOSCOPY;  Surgeon: Malissa Hippo, MD;  Location: [facility];  Service: Endoscopy;  Laterality: N/A;  . Colonoscopy N/A 04/21/2014    Procedure: COLONOSCOPY;  Surgeon: Malissa Hippo, MD;  Location: [facility];  Service: Endoscopy;  Laterality: N/A;  1030  . Tee without cardioversion N/A 09/23/2014    Procedure: TRANSESOPHAGEAL ECHOCARDIOGRAM (TEE);  Surgeon: Rachelle Hora  Croitoru, MD;  Location: [facility];  Service: Cardiovascular;  Laterality: N/A;      Social History History  Substance Use Topics  . Smoking status: Never Smoker   . Smokeless tobacco: Never Used  . Alcohol Use: No   bedbound, lives at home with her daughter [name]  Family History Family History  Problem Relation Age of Onset  . Heart disease Mother 13  . Hypertension Mother   . Colon cancer Father   . Hypertension Sister     x2  . Diabetes Sister     x2  . Heart failure Sister   . Lupus Brother    a niece has seizures, the patient's mother had a stroke.  Prior to Admission medications   Medication Sig Start Date End Date Taking? Authorizing Provider  Amino Acids-Protein Hydrolys (FEEDING SUPPLEMENT, PRO-STAT SUGAR FREE 64,) LIQD Take 30 mLs by mouth daily. Patient taking differently: Take 30 mLs by mouth 5 (five) times daily.  10/01/14   Ripudeep Jenna Luo,  MD  amLODipine (NORVASC) 10 MG tablet Place 1 tablet (10 mg total) into feeding tube every morning. 10/01/14   Ripudeep Jenna Luo, MD  aspirin 81 MG chewable tablet Chew 162 mg by mouth daily.     Historical Provider, MD  budesonide-formoterol (SYMBICORT) 160-4.5 MCG/ACT inhaler Inhale 2 puffs into the lungs daily as needed (Shortness Of Breath).     Historical Provider, MD  carvedilol (COREG) 6.25 MG tablet TAKE ONE TABLET INTO FEEDING TUBE TWICE DAILY WITH MEALS 06/29/15   Kerri Perches, MD  Cholecalciferol (VITAMIN D3) 50000 UNITS TABS Take 1 tablet by mouth once a week. By feeding tube 04/05/15   Kerri Perches, MD  clonazePAM Scarlette [place]) 0.5 MG tablet One tablet three times daily, every 8 hours for anxiety andf agitation 12/21/14   Kerri Perches, MD  cloNIDine (CATAPRES - DOSED IN MG/24 HR) 0.3 mg/24hr patch APPLY 1 PATCH TOPICALLY ONCE A WEEK 07/11/15   Kerri Perches, MD  DULoxetine (CYMBALTA) 30 MG capsule Take 30 mg by mouth 2 (two) times daily. 05/23/15   Historical Provider, MD  gabapentin (NEURONTIN) 300 MG capsule TAKE ONE CAPSULE BY MOUTH TWICE DAILY 05/16/15   Kerri Perches, MD  HYDROcodone-acetaminophen (NORCO/VICODIN) 5-325 MG per tablet Take 1 tablet by mouth 2 (two) times daily as needed for moderate pain. 04/14/15   Kerri Perches, MD  Ketamine HCl POWD Apply 1 application topically 3 (three) times daily as needed. 04/25/15   Historical Provider, MD  levothyroxine (SYNTHROID, LEVOTHROID) 75 MCG tablet One tablet mon-fri and one half tab on sat and sun 04/05/15   Kerri Perches, MD  lovastatin (MEVACOR) 40 MG tablet Place 1 tablet (40 mg total) into feeding tube at bedtime. 10/01/14   Ripudeep Jenna Luo, MD  lovastatin (MEVACOR) 40 MG tablet TAKE ONE TABLET BY MOUTH ONCE DAILY WITH BREAKFAST 07/11/15   Kerri Perches, MD  meclizine (ANTIVERT) 25 MG tablet Place 25 mg into feeding tube 3 (three) times daily as needed for dizziness.     Historical Provider, MD   Multiple Vitamins-Minerals (MULTIVITAMIN WITH MINERALS) tablet Take 1 tablet by mouth daily.    Historical Provider, MD  polyethylene glycol (MIRALAX / GLYCOLAX) packet Take 17 g by mouth daily as needed for mild constipation.     Historical Provider, MD  prochlorperazine (COMPAZINE) 10 MG tablet Take 10 mg by mouth every 6 (six) hours as needed for nausea or vomiting.    Historical Provider, MD  Water For Irrigation, Sterile (FREE WATER) SOLN Place 100 mLs into feeding tube every 4 (four) hours. 10/01/14   Ripudeep Jenna Luo, MD    Allergies  Allergen Reactions  . Morphine Nausea And Vomiting    Physical Exam  Vitals  Blood pressure 165/75, pulse 70, temperature 99 F (37.2 C), temperature source Oral, resp. rate 11, height 5\' 6"  (1.676 m), SpO2 100 %.   General: Obese, nonspeaking female lying in bed in NAD, daughter son and granddaughter at bedside. Patient appears well cared for   Psych:  Patient appears awake, she will look at me, she does not follow my commands, she does not speak to me.  Neuro:   Right arm and leg do not move. I'm unable to assess strength or sensation as the patient will not follow commands  ENT:  Ears and Eyes appear Normal, Conjunctivae clear, PER. Moist oral mucosa without erythema or exudates.  Neck:  Thick, Supple, No lymphadenopathy appreciated  Respiratory:  Symmetrical chest wall movement, Good air movement bilaterally, CTAB. Patient does not inspire deeply.  Cardiac:  RRR, No Murmurs,  no JVD.    Abdomen:  Positive bowel sounds, Soft, Non tender, Non distended,  No masses appreciated  Skin:  No Cyanosis, Normal Skin Turgor, No Skin Rash or Bruise.   Data Review  CBC  Recent Labs Lab 07/24/15 1506  WBC 7.2  HGB 12.7  HCT 36.3  PLT 271  MCV 87.3  MCH 30.5  MCHC 35.0  RDW 16.6*    Chemistries   Recent Labs Lab 07/24/15 1506  NA 140  K 3.7  CL 105  CO2 29  GLUCOSE 83  BUN 10  CREATININE 0.69  CALCIUM 9.7      Urinalysis    Component Value Date/Time   COLORURINE YELLOW 07/24/2015 1415   APPEARANCEUR CLOUDY* 07/24/2015 1415   LABSPEC 1.011 07/24/2015 1415   PHURINE 7.0 07/24/2015 1415   GLUCOSEU NEGATIVE 07/24/2015 1415   GLUCOSEU NEG mg/dL 16/09/9603 5409   HGBUR NEGATIVE 07/24/2015 1415   HGBUR large 05/18/2010 1003   BILIRUBINUR NEGATIVE 07/24/2015 1415   BILIRUBINUR neg 05/04/2014 1133   KETONESUR NEGATIVE 07/24/2015 1415   PROTEINUR 30* 07/24/2015 1415   PROTEINUR neg 05/04/2014 1133   UROBILINOGEN 1.0 07/24/2015 1415   UROBILINOGEN 0.2 05/04/2014 1133   NITRITE NEGATIVE 07/24/2015 1415   NITRITE neg 05/04/2014 1133   LEUKOCYTESUR NEGATIVE 07/24/2015 1415    Imaging results:   Dg Chest 1 View  07/24/2015   CLINICAL DATA:  Muscle twitching.  EXAM: CHEST  1 VIEW  COMPARISON:  09/28/2014.  FINDINGS: Stable enlarged cardiac silhouette. Left lower lobe opacity. Mild linear density at the right lung base. Thoracic spine degenerative changes.  IMPRESSION: 1. Left lower lobe atelectasis or pneumonia. 2. Mild right basilar atelectasis. 3. Stable cardiomegaly.   Electronically Signed   By: Beckie Salts M.D.   On: 07/24/2015 13:50   Ct Head Wo Contrast  07/24/2015   CLINICAL DATA:  New onset seizure  EXAM: CT HEAD WITHOUT CONTRAST  TECHNIQUE: Contiguous axial images were obtained from the base of the skull through the vertex without intravenous contrast.  COMPARISON:  09/29/2014  FINDINGS: No skull fracture is noted. No intracranial hemorrhage, mass effect or midline shift. There is old infarct in left MCA territory. No acute cortical infarction. No mass lesion is noted on this unenhanced scan. Mild cerebral atrophy. Mild periventricular white matter decreased attenuation probable due to chronic small vessel ischemic changes. Stable old infarcts  in right frontal lobe and right cerebellum.  IMPRESSION: No acute intracranial abnormality. Stable bilateral old infarcts as described above. Stable  mild cerebral atrophy.   Electronically Signed   By: Natasha Mead M.D.   On: 07/24/2015 14:19    My personal review of EKG: Sinus rhythm with PVCs, no QT prolongation   Assessment & Plan  Principal Problem:   Seizure Active Problems:   Hypothyroid   Obesity   Depression   VERTIGO, INTERMITTENT   Hyperlipidemia   Hypertension   History of kidney cancer   Anemia   CKD (chronic kidney disease) stage 3, GFR 30-59 ml/min   Chronic ischemic left MCA stroke   Left lower lobe pneumonia   Urinary tract infection   Left hip pain   Hemiplegia affecting right dominant side  Seizure ER physician spoke with neuro (Dr. Hosie Poisson) who recommended an MRI, starting the patient on Keppra, and outpatient follow-up.  If MRI is positive for acute changes please consult neuro. Neuro mentioned that any infection could trigger a seizure. Patient will be started on Keppra 500 mg twice a day. Need to determine from pharmacy whether or not this can be placed through a PEG tube.  UTI UA appears infected. Culture pending Levaquin started  Left lower lobe pneumonia versus atelectasis Speech therapy consultation requested to recommend a safe diet for recommend nothing by mouth. Patient started on Levaquin.  Left hip pain Thought to be sciatica. Patient started on Neurontin. Received no relief with Neurontin. Will x-ray left hip. Continue Vicodin when necessary.  Chronic kidney disease Creatinine at baseline  Hypertension Continue carvedilol, amlodipine, clonidine  Hyperlipidemia Continue pravastatin  Depression  continue Cymbalta and Klonopin. Per pharmacy rec Prozac is being tapered off. We'll discontinue  Hypothyroidism Continue Synthroid  Dysphagia Continue PEG tube feedings with free water flushes. Nutrition consulted.  Consultants Called:  Neurology. May need to reconsult if MRI is positive  Family Communication:  [name], [place], primary caretaker at bedside  Code Status:   DO NOT RESUSCITATE  Condition:  Guarded  Potential Disposition: To home when workup complete.  Time spent in minutes : [phone],  PA-C on 07/24/2015 at 5:24 PM Between 7am to 7pm - Pager - [phone] After 7pm go to [email or web address] - password [email or web address] And look for the night coverage person covering me after hours  [facility]

## 2015-07-25 ENCOUNTER — Inpatient Hospital Stay (HOSPITAL_COMMUNITY): Payer: Medicare Other

## 2015-07-25 DIAGNOSIS — I639 Cerebral infarction, unspecified: Secondary | ICD-10-CM | POA: Diagnosis present

## 2015-07-25 DIAGNOSIS — I6789 Other cerebrovascular disease: Secondary | ICD-10-CM

## 2015-07-25 DIAGNOSIS — C9000 Multiple myeloma not having achieved remission: Secondary | ICD-10-CM | POA: Diagnosis present

## 2015-07-25 LAB — COMPREHENSIVE METABOLIC PANEL
ALT: 20 U/L (ref 14–54)
ANION GAP: 6 (ref 5–15)
AST: 18 U/L (ref 15–41)
Albumin: 2.8 g/dL — ABNORMAL LOW (ref 3.5–5.0)
Alkaline Phosphatase: 40 U/L (ref 38–126)
BUN: 12 mg/dL (ref 6–20)
CALCIUM: 9.5 mg/dL (ref 8.9–10.3)
CO2: 28 mmol/L (ref 22–32)
Chloride: 103 mmol/L (ref 101–111)
Creatinine, Ser: 0.86 mg/dL (ref 0.44–1.00)
GFR calc Af Amer: >60 mL/min (ref >60)
Glucose, Bld: 82 mg/dL (ref 65–99)
POTASSIUM: 3.6 mmol/L (ref 3.5–5.1)
Sodium: 137 mmol/L (ref 135–145)
Total Bilirubin: 0.6 mg/dL (ref 0.3–1.2)
Total Protein: 7.3 g/dL (ref 6.5–8.1)

## 2015-07-25 LAB — GLUCOSE, CAPILLARY
GLUCOSE-CAPILLARY: 101 mg/dL — AB (ref 65–99)
Glucose-Capillary: 112 mg/dL — ABNORMAL HIGH (ref 65–99)

## 2015-07-25 MED ORDER — JEVITY 1.2 CAL PO LIQD: 1000.0000 mL | ORAL | Status: DC

## 2015-07-25 MED ORDER — DEXTROSE 5 % IV SOLN
1.0000 g | INTRAVENOUS | Status: DC
  Administered 2015-07-25 – 2015-07-28 (×4): 1 g via INTRAVENOUS
  Filled 2015-07-25 (×4): qty 10

## 2015-07-25 MED ORDER — SODIUM CHLORIDE 0.9 % IV SOLN
500.0000 mg | Freq: Once | INTRAVENOUS | Status: AC
  Administered 2015-07-25: 500 mg via INTRAVENOUS
  Filled 2015-07-25: qty 5

## 2015-07-25 MED ORDER — LEVETIRACETAM 100 MG/ML PO SOLN
1000.0000 mg | Freq: Two times a day (BID) | ORAL | Status: DC
  Administered 2015-07-25 – 2015-07-28 (×6): 1000 mg
  Filled 2015-07-25 (×7): qty 10

## 2015-07-25 MED ORDER — OSMOLITE 1.5 CAL PO LIQD
237.0000 mL | Freq: Every day | ORAL | Status: DC
  Administered 2015-07-25 – 2015-08-03 (×41): 237 mL
  Filled 2015-07-25 (×62): qty 237

## 2015-07-25 MED ORDER — PRO-STAT SUGAR FREE PO LIQD
30.0000 mL | Freq: Two times a day (BID) | ORAL | Status: DC
  Administered 2015-07-25 – 2015-08-03 (×18): 30 mL
  Filled 2015-07-25 (×18): qty 30

## 2015-07-25 MED ORDER — ALBUTEROL SULFATE (2.5 MG/3ML) 0.083% IN NEBU: 2.5000 mg | INHALATION_SOLUTION | Freq: Four times a day (QID) | RESPIRATORY_TRACT | Status: DC | PRN

## 2015-07-25 NOTE — Progress Notes (Signed)
[facility] PROGRESS NOTE  Melissa [name] ZOX:[id number] DOB: 17-Aug-1946 DOA: 07/24/2015 PCP: [name], MD  Assessment/Plan:  Principal Problem:   Seizures? Now with myoclonic jerks v. Partial seizures. Consulted neuro who are here at bedside.  Stat EEG ordered. Active Problems:   Acute ischemic stroke:  MRI confirms. Will complete workup. On 2 low-dose aspirins daily. Followed by Dr. Gerilyn Pilgrim   Urinary tract infection:  Change antibiotics to Rocephin and await cultures. Fluoroquinolones can lower seizure threshold.   Hypothyroid:  TSH in April borderline low. We'll recheck.   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 Family reports that she has PEG tube for poor oral intake but is able to swallow.   Left lower lobe pneumonia v atelectasis   Left hip pain   Hemiplegia affecting right dominant side   Multiple myeloma  Code Status:  DO NOT RESUSCITATE Family Communication:  Daughters at bedside Disposition Plan:  home  Consultants:  Neurology  Procedures:     Antibiotics:    HPI/Subjective: Unable. Per daughters, has had continued left leg and arm jerking. Seems less conversant than usual. Usually at baseline, able to repeat a few words  Objective: Filed Vitals:   07/25/15 0532  BP: 169/93  Pulse: 81  Temp: 98.1 F (36.7 C)  Resp: 20   No intake or output data in the 24 hours ending 07/25/15 0946 There were no vitals filed for this visit.  Exam:   General:  Nonverbal. Will follow an occasional command.  Cardiovascular: Regular rate rhythm without murmurs gallops rubs  Respiratory: Clear to auscultation bilaterally without wheezes rhonchi or rales  Abdomen: Soft nontender nondistended  Ext: No edema. Right foot with foot drop boot.  Neurologic:  Right hemiparesis. Left leg greater than arm jerks with spasticity.  Basic Metabolic Panel:  Recent Labs Lab 07/24/15 1506 07/25/15 0545  NA 140 137  K 3.7 3.6  CL 105 103  CO2 29 28  GLUCOSE 83 82  BUN 10 12  CREATININE 0.69 0.86  CALCIUM 9.7 9.5   Liver Function Tests:  Recent Labs Lab 07/25/15 0545  AST 18  ALT 20  ALKPHOS 40  BILITOT 0.6  PROT 7.3  ALBUMIN 2.8*   No results for input(s): LIPASE, AMYLASE in the last 168 hours. No results for input(s): AMMONIA in the last 168 hours. CBC:  Recent Labs Lab 07/24/15 1506  WBC 7.2  HGB 12.7  HCT 36.3  MCV 87.3  PLT 271   Cardiac Enzymes: No results for input(s): CKTOTAL, CKMB, CKMBINDEX, TROPONINI in the last 168 hours. BNP (last 3 results) No results for input(s): BNP in the last 8760 hours.  ProBNP (last 3 results)  Recent Labs  09/19/14 1004  PROBNP 1843.0*    CBG: No results for input(s): GLUCAP in the last 168 hours.  No results found for this or any previous visit (from the past 240 hour(s)).   Studies: Dg Chest 1 View  07/24/2015   CLINICAL DATA:  Muscle twitching.  EXAM: CHEST  1 VIEW  COMPARISON:  09/28/2014.  FINDINGS: Stable enlarged cardiac silhouette. Left lower lobe opacity. Mild linear density at the right lung base. Thoracic spine degenerative changes.  IMPRESSION: 1. Left lower lobe atelectasis or pneumonia. 2. Mild right basilar atelectasis. 3. Stable cardiomegaly.  Electronically Signed   By: Beckie Salts M.D.   On: 07/24/2015 13:50   Ct Head Wo Contrast  07/24/2015   CLINICAL DATA:  New onset seizure  EXAM: CT HEAD WITHOUT CONTRAST  TECHNIQUE: Contiguous axial images were obtained from the base of the skull through the vertex without intravenous contrast.  COMPARISON:  09/29/2014  FINDINGS: No skull fracture is noted. No intracranial hemorrhage, mass effect or midline shift. There is old infarct in left MCA territory. No acute cortical infarction. No mass lesion is noted on this  unenhanced scan. Mild cerebral atrophy. Mild periventricular white matter decreased attenuation probable due to chronic small vessel ischemic changes. Stable old infarcts in right frontal lobe and right cerebellum.  IMPRESSION: No acute intracranial abnormality. Stable bilateral old infarcts as described above. Stable mild cerebral atrophy.   Electronically Signed   By: Natasha Mead M.D.   On: 07/24/2015 14:19   Mr Brain Wo Contrast  07/24/2015   CLINICAL DATA:  Chronic right sided weakness from previous stroke. Left-sided seizure today.  EXAM: MRI HEAD WITHOUT CONTRAST  TECHNIQUE: Multiplanar, multiecho pulse sequences of the brain and surrounding structures were obtained without intravenous contrast.  COMPARISON:  Head CT same day.  MRI 09/19/2014.  FINDINGS: The study suffers from motion degradation. There chronic small-vessel ischemic changes throughout the pons. There are numerous old cerebellar infarctions bilaterally. No acute cerebellar insult. There is old infarction in the left MCA territory affecting the posterior temporal lobe and parietal lobe on the left . This has progressed to atrophy and encephalomalacia with evidence of old laminar necrosis. In the region of old infarction, there are areas of acute extension. There are 1 or 2 punctate foci of acute infarction elsewhere in the left frontal lobe. No evidence of swelling or acute hemorrhage. Chronic small vessel ischemic changes affect the cerebral hemisphere white matter bilaterally. There is an old right frontoparietal infarction at the vertex. No evidence of mass lesion, hydrocephalus or extra-axial collection. Major vessels at the base of the brain show flow.  IMPRESSION: Old infarction in the left MCA territory affecting the posterior temporal lobe and parietal region having progressed to atrophy, encephalomalacia and gliosis. Areas of acute infarction representing extension of infarction in that region. 2 punctate foci of acute infarction  separate from that area in the left frontal lobe.  Extensive old infarctions elsewhere throughout the brain as outlined above.   Electronically Signed   By: Paulina Fusi M.D.   On: 07/24/2015 20:05   Dg Hip Unilat With Pelvis 2-3 Views Left  07/24/2015   CLINICAL DATA:  Left hip pain following a seizure today.  EXAM: DG HIP (WITH OR WITHOUT PELVIS) 2-3V LEFT  COMPARISON:  CT scan 11/06/2014  FINDINGS: Both hips are normally located. Mild degenerative changes but no acute hip fracture. No definite pelvic fractures. Vascular calcifications are noted.  IMPRESSION: No definite acute hip or pelvic fractures.   Electronically Signed   By: Rudie Meyer M.D.   On: 07/24/2015 19:13    Scheduled Meds: . amLODipine  10 mg Per Tube q morning - 10a  . aspirin  162 mg Per Tube Daily  . carvedilol  6.25 mg Per Tube BID WC  . cefTRIAXone (ROCEPHIN)  IV  1 g Intravenous Q24H  . cloNIDine  0.3 mg Transdermal Weekly  . DULoxetine  30 mg Oral BID  . enoxaparin (LOVENOX) injection  40 mg Subcutaneous Q24H  . feeding supplement (PRO-STAT SUGAR FREE 64)  30 mL Oral 5  X Daily  . free water  100 mL Per Tube 6 times per day  . levETIRAcetam  500 mg Per Tube BID  . levothyroxine  75 mcg Per Tube QAC breakfast  . pravastatin  40 mg Oral q1800  . sodium chloride  3 mL Intravenous Q12H  . sorbitol  30 mL Per Tube Daily   Continuous Infusions:   Time spent: 35 minutes  Vinicius Brockman L  [facility] [email or web address], password [facility] 07/25/2015, 9:46 AM  LOS: 1 day

## 2015-07-25 NOTE — Progress Notes (Signed)
STAT EEG completed; results pending. 

## 2015-07-25 NOTE — Progress Notes (Signed)
Initial Nutrition Assessment   INTERVENTION:  Provide 1 can (234ml) of Osmolite 1.5 via PEG 5 times daily with 30 ml Prostat via PEG BID.    Tube feeding regimen provides 1978 kcal (100% of needs), 104 grams of protein, and 900 ml of H2O.   Provide additional 150 ml free water flushes every 4 hours to provide an additional 900 ml of fluid daily   NUTRITION DIAGNOSIS:   Inadequate oral intake related to dysphagia as evidenced by NPO status.   GOAL:   Patient will meet greater than or equal to 90% of their needs   MONITOR:   TF tolerance, Skin, I & O's, Labs, Weight trends  REASON FOR ASSESSMENT:   Consult Enteral/tube feeding initiation and management  ASSESSMENT:   69 year old female with right-sided hemiparesis secondary to stroke, dysphagia s/p PEG, bedbound presented with twitching on her left leg followed by an unresponsive episode.   Pt daughter's at bedside report that PTA pt was receiving 1 can of TwoCal HN 5 times daily along with 30 ml of Pro-stat 5 times daily and 100 ml free water flushes 6 times daily. This provides 2875 kcal, 175 grams of protein, and 1430 ml of water. They report pt used to eat small amounts of pureed foods and dessert but, has not been taking anything PO recently. They state that patient has not been weighed since September but, they feel pt appears thinner and clothes are fitting looser.  Per physical exam, pt has some mild muscle and fat wasting.   Labs reviewed,   Diet Order:  Diet NPO time specified  Skin:  Wound (see comment) (abrasion on buttock)  Last BM:  PTA  Height:   Ht Readings from Last 1 Encounters:  07/24/15 5\' 6"  (1.676 m)    Weight:   Wt Readings from Last 1 Encounters:  04/25/15 240 lb (108.863 kg)    Ideal Body Weight:  59 kg  BMI:  There is no weight on file to calculate BMI.  Estimated Nutritional Needs:   Kcal:  1750-2000  Protein:  100-120 grams  Fluid:  1.8-2 L/day  EDUCATION NEEDS:   No  education needs identified at this time  Pryor Ochoa RD, LDN Inpatient Clinical Dietitian Pager: [phone] After Hours Pager: [phone]

## 2015-07-25 NOTE — Consult Note (Addendum)
NEURO HOSPITALIST CONSULT NOTE   Referring physician: Conley Canal   Reason for Consult: abnormal movements on the left  HPI:                                                                                                                                          Melissa [name] is an 69 y.o. 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 She presents to the emergency department with possible left-sided seizure. In ED she was found to have UTI and was admitted for treatment of UTI and ongoing jerking movements of the left arm and leg. patient received Ativan once while in ED which seemed to stop the movements per family.  She was also loaded with 1000 mg Keppra and currently on 500 mg BID.  The left arm and leg continues to have non-rhythmic jerking which could be myoclonus versus seizure.  Neurology was asked to evaluate.   Past Medical History  Diagnosis Date  . Vertigo, intermittent   . Hypothyroidism   . Hyperlipidemia   . Obesity   . Hypertension     severe and resistant to treatment; 2008-negative left renal angiogram  . Vitiligo   . Degenerative disc disease, lumbar   . Degenerative disc disease, cervical   . Chest pain 2008    2008-normal coronary angiography  . 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 Renal cell carcinoma     Right nephrectomy  . Multiple myeloma 08/26/2014    Past Surgical History  Procedure Laterality Date  . Tubal ligation  1983    Bilateral  . Nephrectomy      Right; secondary to renal cell carcinoma  . Colonoscopy N/A 04/21/2014    Procedure: COLONOSCOPY;  Surgeon: Rogene Houston, MD;  Location: [facility];  Service: Endoscopy;  Laterality: N/A;  . Colonoscopy N/A 04/21/2014    Procedure: COLONOSCOPY;  Surgeon: Rogene Houston, MD;  Location: [facility];  Service:  Endoscopy;  Laterality: N/A;  1030  . Tee without cardioversion N/A 09/23/2014    Procedure: TRANSESOPHAGEAL ECHOCARDIOGRAM (TEE);  Surgeon: Sanda Klein, MD;  Location: [facility] ENDOSCOPY;  Service: Cardiovascular;  Laterality: N/A;    Family History  Problem Relation Age of Onset  . Heart disease Mother 53  . Hypertension Mother   . Colon cancer Father   . Hypertension Sister     x2  . Diabetes Sister     x2  . Heart failure Sister   . Lupus Brother      Social History:  reports that she has never smoked. She has never used smokeless tobacco. She reports  that she does not drink alcohol or use illicit drugs.  Allergies  Allergen Reactions  . Morphine Nausea And Vomiting    MEDICATIONS:                                                                                                                     Prior to Admission:  Prescriptions prior to admission  Medication Sig Dispense Refill Last Dose  . albuterol (PROVENTIL) (2.5 MG/3ML) 0.083% nebulizer solution Take 2.5 mg by nebulization every 6 (six) hours as needed for wheezing or shortness of breath.   over 30 days  . Amino Acids-Protein Hydrolys (FEEDING SUPPLEMENT, PRO-STAT SUGAR FREE 64,) LIQD Take 30 mLs by mouth daily. (Patient taking differently: Take 30 mLs by mouth 2 (two) times daily. ) 900 mL 0 07/24/2015 at Unknown time  . amLODipine (NORVASC) 10 MG tablet Place 1 tablet (10 mg total) into feeding tube every morning.   07/24/2015 at Unknown time  . aspirin 81 MG chewable tablet 162 mg by Feeding Tube route daily.    07/24/2015 at Unknown time  . budesonide-formoterol (SYMBICORT) 160-4.5 MCG/ACT inhaler Inhale 2 puffs into the lungs at bedtime.    07/23/2015 at Unknown time  . carvedilol (COREG) 6.25 MG tablet TAKE ONE TABLET INTO FEEDING TUBE TWICE DAILY WITH MEALS 60 tablet 3 07/24/2015 at 800  . Cholecalciferol (VITAMIN D3) 50000 UNITS TABS Take 1 tablet by mouth once a week. By feeding tube (Patient taking differently: 1 tablet by  Feeding Tube route once a week. ) 4 tablet 5 Past Week at Unknown time  . clonazePAM (KLONOPIN) 0.5 MG tablet One tablet three times daily, every 8 hours for anxiety andf agitation (Patient taking differently: 0.5 mg by Feeding Tube route 3 (three) times daily. ) 90 tablet 3 07/24/2015 at Unknown time  . cloNIDine (CATAPRES - DOSED IN MG/24 HR) 0.3 mg/24hr patch APPLY 1 PATCH TOPICALLY ONCE A WEEK 4 patch 2 07/24/2015 at Unknown time  . DULoxetine (CYMBALTA) 30 MG capsule 30 mg by Feeding Tube route 2 (two) times daily.    07/24/2015 at Unknown time  . gabapentin (NEURONTIN) 300 MG capsule TAKE ONE CAPSULE BY MOUTH TWICE DAILY (Patient taking differently: TAKE ONE CAPSULE BY FEEDING TUBE TWICE DAILY) 60 capsule 2 07/24/2015 at Unknown time  . HYDROcodone-acetaminophen (NORCO/VICODIN) 5-325 MG per tablet Take 1 tablet by mouth 2 (two) times daily as needed for moderate pain. 60 tablet 0 07/18/2015  . Ketamine HCl POWD Apply 1 application topically 2 (two) times daily.    07/24/2015 at Unknown time  . levothyroxine (SYNTHROID, LEVOTHROID) 75 MCG tablet One tablet mon-fri and one half tab on sat and sun (Patient taking differently: Take 37.5-75 mcg by mouth daily before breakfast. One tablet mon-fri and one half tab on sat and sun) 26 tablet 5 07/24/2015 at Unknown time  . lovastatin (MEVACOR) 40 MG tablet Place 1 tablet (40 mg total) into feeding tube at bedtime.   07/23/2015 at Unknown time  . meclizine (ANTIVERT)  25 MG tablet Place 25 mg into feeding tube 3 (three) times daily as needed for dizziness.    07/23/2015 at Unknown time  . Multiple Vitamins-Minerals (MULTIVITAMIN WITH MINERALS) tablet Take 1 tablet by mouth daily.   07/24/2015 at Unknown time  . pantoprazole (PROTONIX) 40 MG tablet 40 mg by Feeding Tube route 2 (two) times daily.   07/24/2015 at Unknown time  . polyethylene glycol (MIRALAX / GLYCOLAX) packet Take 17 g by mouth daily as needed for mild constipation.    over 30 days  . Water For Irrigation,  Sterile (FREE WATER) SOLN Place 100 mLs into feeding tube every 4 (four) hours.   unknown  . lovastatin (MEVACOR) 40 MG tablet TAKE ONE TABLET BY MOUTH ONCE DAILY WITH BREAKFAST (Patient not taking: Reported on 07/24/2015) 30 tablet 2 Not Taking at Unknown time  . prochlorperazine (COMPAZINE) 10 MG tablet 10 mg by Feeding Tube route every 6 (six) hours as needed for nausea or vomiting.    Not Taking at Unknown time   Scheduled: . amLODipine  10 mg Per Tube q morning - 10a  . aspirin  162 mg Per Tube Daily  . carvedilol  6.25 mg Per Tube BID WC  . cefTRIAXone (ROCEPHIN)  IV  1 g Intravenous Q24H  . cloNIDine  0.3 mg Transdermal Weekly  . DULoxetine  30 mg Oral BID  . enoxaparin (LOVENOX) injection  40 mg Subcutaneous Q24H  . feeding supplement (PRO-STAT SUGAR FREE 64)  30 mL Oral 5 X Daily  . free water  100 mL Per Tube 6 times per day  . levETIRAcetam  500 mg Per Tube BID  . levothyroxine  75 mcg Per Tube QAC breakfast  . pravastatin  40 mg Oral q1800  . sodium chloride  3 mL Intravenous Q12H  . sorbitol  30 mL Per Tube Daily     ROS:                                                                                                                                       History obtained from unobtainable from patient due to mental status    Blood pressure 153/74, pulse 87, temperature 100 F (37.8 C), temperature source Oral, resp. rate 20, height $RemoveBe'5\' 6"'jZRNOOKVB$  (1.676 m), SpO2 98 %.   Neurologic Examination:  HEENT-  Normocephalic, no lesions, without obvious abnormality.  Normal external eye and conjunctiva.  Normal TM's bilaterally.  Normal auditory canals and external ears. Normal external nose, mucus membranes and septum.  Normal pharynx. Cardiovascular- S1, S2 normal, pulses palpable throughout   Lungs- chest clear, no wheezing, rales, normal symmetric air entry Abdomen- normal findings:  bowel sounds normal Extremities- no edema Lymph-no adenopathy palpable Musculoskeletal-no joint tenderness, deformity or swelling Skin-warm and dry, no hyperpigmentation, vitiligo, or suspicious lesions  Neurological Examination Mental Status: Alert, not oriented to hospital but does recognize family, thought content appropriate.  Speech dysarthric with evidence of aphasia.  Able to follow only simple commands without difficulty. Cranial Nerves: II: Discs flat bilaterally; Visual fields grossly normal, pupils equal, round, reactive to light and accommodation III,IV, VI: ptosis not present, extra-ocular motions intact bilaterally V,VII: smile symmetric, facial light touch sensation normal bilaterally VIII: hearing normal bilaterally IX,X: uvula rises symmetrically XI: bilateral shoulder shrug XII: midline tongue extension Motor: Right : Upper extremity   0/5    Left:     Upper extremity   5/5  Lower extremity   0/5     Lower extremity   5/5 Tone and bulk:normal tone throughout; no atrophy noted Sensory: Pinprick and light touch intact throughout, bilaterally Deep Tendon Reflexes: 2+ and symmetric throughout left UE and 3+ on the right.  Plantars: Right :upgoing   Left: downgoing       Lab Results: Basic Metabolic Panel:  Recent Labs Lab 07/24/15 1506 07/25/15 0545  NA 140 137  K 3.7 3.6  CL 105 103  CO2 29 28  GLUCOSE 83 82  BUN 10 12  CREATININE 0.69 0.86  CALCIUM 9.7 9.5    Liver Function Tests:  Recent Labs Lab 07/25/15 0545  AST 18  ALT 20  ALKPHOS 40  BILITOT 0.6  PROT 7.3  ALBUMIN 2.8*   No results for input(s): LIPASE, AMYLASE in the last 168 hours. No results for input(s): AMMONIA in the last 168 hours.  CBC:  Recent Labs Lab 07/24/15 1506  WBC 7.2  HGB 12.7  HCT 36.3  MCV 87.3  PLT 271    Cardiac Enzymes: No results for input(s): CKTOTAL, CKMB, CKMBINDEX, TROPONINI in the last 168 hours.  Lipid Panel: No results for input(s): CHOL,  TRIG, HDL, CHOLHDL, VLDL, LDLCALC in the last 168 hours.  CBG: No results for input(s): GLUCAP in the last 168 hours.  Microbiology: Results for orders placed or performed during the hospital encounter of 11/06/14  Urine culture     Status: None   Collection Time: 11/06/14 11:41 AM  Result Value Ref Range Status   Specimen Description URINE, CATHETERIZED  Final   Special Requests NONE  Final   Culture  Setup Time   Final    11/06/2014 20:37 Performed at [facility] Performed at [facility]   Final   Culture NO GROWTH Performed at [facility]   Final   Report Status 11/08/2014 FINAL  Final    Coagulation Studies: No results for input(s): LABPROT, INR in the last 72 hours.  Imaging: Dg Chest 1 View  07/24/2015   CLINICAL DATA:  Muscle twitching.  EXAM: CHEST  1 VIEW  COMPARISON:  09/28/2014.  FINDINGS: Stable enlarged cardiac silhouette. Left lower lobe opacity. Mild linear density at the right lung base. Thoracic spine degenerative changes.  IMPRESSION: 1. Left lower lobe atelectasis or pneumonia. 2. Mild right basilar atelectasis.  3. Stable cardiomegaly.   Electronically Signed   By: Claudie Revering M.D.   On: 07/24/2015 13:50   Ct Head Wo Contrast  07/24/2015   CLINICAL DATA:  New onset seizure  EXAM: CT HEAD WITHOUT CONTRAST  TECHNIQUE: Contiguous axial images were obtained from the base of the skull through the vertex without intravenous contrast.  COMPARISON:  09/29/2014  FINDINGS: No skull fracture is noted. No intracranial hemorrhage, mass effect or midline shift. There is old infarct in left MCA territory. No acute cortical infarction. No mass lesion is noted on this unenhanced scan. Mild cerebral atrophy. Mild periventricular white matter decreased attenuation probable due to chronic small vessel ischemic changes. Stable old infarcts in right frontal lobe and right cerebellum.  IMPRESSION: No acute intracranial abnormality.  Stable bilateral old infarcts as described above. Stable mild cerebral atrophy.   Electronically Signed   By: Lahoma Crocker M.D.   On: 07/24/2015 14:19   Mr Brain Wo Contrast  07/24/2015   CLINICAL DATA:  Chronic right sided weakness from previous stroke. Left-sided seizure today.  EXAM: MRI HEAD WITHOUT CONTRAST  TECHNIQUE: Multiplanar, multiecho pulse sequences of the brain and surrounding structures were obtained without intravenous contrast.  COMPARISON:  Head CT same day.  MRI 09/19/2014.  FINDINGS: The study suffers from motion degradation. There chronic small-vessel ischemic changes throughout the pons. There are numerous old cerebellar infarctions bilaterally. No acute cerebellar insult. There is old infarction in the left MCA territory affecting the posterior temporal lobe and parietal lobe on the left . This has progressed to atrophy and encephalomalacia with evidence of old laminar necrosis. In the region of old infarction, there are areas of acute extension. There are 1 or 2 punctate foci of acute infarction elsewhere in the left frontal lobe. No evidence of swelling or acute hemorrhage. Chronic small vessel ischemic changes affect the cerebral hemisphere white matter bilaterally. There is an old right frontoparietal infarction at the vertex. No evidence of mass lesion, hydrocephalus or extra-axial collection. Major vessels at the base of the brain show flow.  IMPRESSION: Old infarction in the left MCA territory affecting the posterior temporal lobe and parietal region having progressed to atrophy, encephalomalacia and gliosis. Areas of acute infarction representing extension of infarction in that region. 2 punctate foci of acute infarction separate from that area in the left frontal lobe.  Extensive old infarctions elsewhere throughout the brain as outlined above.   Electronically Signed   By: Nelson Chimes M.D.   On: 07/24/2015 20:05   Dg Hip Unilat With Pelvis 2-3 Views Left  07/24/2015   CLINICAL  DATA:  Left hip pain following a seizure today.  EXAM: DG HIP (WITH OR WITHOUT PELVIS) 2-3V LEFT  COMPARISON:  CT scan 11/06/2014  FINDINGS: Both hips are normally located. Mild degenerative changes but no acute hip fracture. No definite pelvic fractures. Vascular calcifications are noted.  IMPRESSION: No definite acute hip or pelvic fractures.   Electronically Signed   By: Marijo Sanes M.D.   On: 07/24/2015 19:13       Assessment and plan per attending neurologist  Etta Quill PA-C [facility] [phone]  07/25/2015, 10:00 AM   Assessment/Plan: 69 YO female with new onset of left UE and LE jerking.  The jerking is non-voluntary and not rhythmic. Patient is able to follow simple commands and talk throughout jerking events.  Due to underlying UTI unclear if this represents cortical myoclonus versus actual seizure.  We have called EEG to come to  room and evaluate if epileptiform activity is noted.  If there is will treat appropriately.  In addition, patient has new acute infarcts in the left frontal lobe and extension of old infarct.   MRI brain imaging reviewed, shows acute punctate infarcts in left frontal lobe and acute extension of infarct in left MCA territory.   Recommend: 1. HgbA1c, fasting lipid panel 2. MRA  of the brain without contrast 3. Frequent neuro checks 4. Echocardiogram 5. Carotid dopplers 6. Prophylactic therapy-Antiplatelet med: Aspirin - dose $Remo'162mg'issYz$  7. Risk factor modification 8. Telemetry monitoring 9. PT consult, OT consult, Speech consult 10. EEG 11. Stroke team will follow for stroke workup   Addendum: EEG shows frequent left hemispheric seizures with a wide field and occasional left sided PLEDs associated with abnormal movements.   Atypical findings since her abnormal motor movements are on the left side also. Question if right sided paralysis is masking seizure activity on the right side and we are seeing spread to the left LE. As symptoms  appear to be a focal motor seizure and patient remains responsive would not aggressively treat with ativan at this time due to risk of respiratory depression requiring intubation. Will increase keppra to $RemoveB'1000mg'qPUyNISd$  BID, consider addition of trileptal if no improvement.    Jim Like, DO [facility] [phone]  If 7pm- 7am, please page neurology on call as listed in [place].

## 2015-07-25 NOTE — Progress Notes (Signed)
Met with patient and daughters at bedside to discuss discharge planning. At this time, patient's daughters intend to take patient back home at discharge. Patient has been active with [facility] in the past, and family would like to use them again if patient needs HH services at discharge.  CM will continue to follow regarding discharge disposition and needs.

## 2015-07-25 NOTE — Progress Notes (Signed)
  Echocardiogram 2D Echocardiogram has been performed.  Melissa [name] M 07/25/2015, 4:04 PM

## 2015-07-25 NOTE — Progress Notes (Signed)
Utilization review completed. Lutricia Widjaja, RN, BSN. 

## 2015-07-25 NOTE — Progress Notes (Addendum)
Pt noted with left side twitching of neck, left upper extremity, and left lower extremity. This lasted about 2 hrs. Ativan and Keppra administered per order. The twitching decreased in severity and twitching continues on left lower extremity for most of the night. Pt responds to voice. Pt alert and easily arousable. Will continue to monitor.    Gilda Crease, RN.

## 2015-07-25 NOTE — Progress Notes (Signed)
SLP Cancellation Note  Patient Details Name: Melissa [name] MRN: [id number] DOB: 1946/04/25   Cancelled treatment:       Reason Eval/Treat Not Completed: Medical issues which prohibited therapy. Given signs of active seizure activity, will hold swallow eval. Pt has PEG tube for nutrition and family reports PO intake has been poor over the past few months. Will return for assessment when pt ready to participate.    Prisha Hiley, Katherene Ponto 07/25/2015, 1:33 PM

## 2015-07-25 NOTE — Procedures (Signed)
History: 69 yo F with abnormal movements.   Sedation: None  Technique: This is a 19 channel routine scalp EEG performed at the bedside with bipolar and monopolar montages arranged in accordance to the international 10/20 system of electrode placement. One channel was dedicated to EKG recording.    Background: There is a posterior dominant rhythm of 8 Hz. There are periodic 1 Hz discharges seen over the left hemisphere with a wide field. When these occur, there is clear movement of the LEFT arm and leg at the same time. Sometimes, the discharges are very sharp as could be expected with muscle artifact, but at other times, the appearance is morethat of a sharp/slow wave. These occur throughout the recording. In addition, there are at least two runs of increased slow activity in the left hemisphere which evolves from theta to delta with abrupt cessation which appears consistent with partial seizures.   Photic stimulation: Physiologic driving is not performed  EEG Abnormalities: 1) Frequent left hemispheric seizures with a wide field 2) periodic lateralized epileptiform discharges associated with abnormal movements   Clinical Interpretation: This EEG is consistent with frequent seizures arising from the left hemisphere with a frontotemporal predominance.   Roland Rack, MD [facility] [phone]  If 7pm- 7am, please page neurology on call as listed in [place].

## 2015-07-25 NOTE — Progress Notes (Addendum)
VASCULAR LAB PRELIMINARY  PRELIMINARY  PRELIMINARY  PRELIMINARY  Carotid duplex completed.    Preliminary report:  Bilateral:  1-39% ICA stenosis.  Vertebral artery flow is antegrade on the right. The left vertebral artery flow could not be insonated due to involuntary movement of the body due to seizure type activity   Danialle Dement, RVS 07/25/2015, 5:08 PM

## 2015-07-26 ENCOUNTER — Inpatient Hospital Stay (HOSPITAL_COMMUNITY): Payer: Medicare Other

## 2015-07-26 DIAGNOSIS — N390 Urinary tract infection, site not specified: Secondary | ICD-10-CM

## 2015-07-26 DIAGNOSIS — I10 Essential (primary) hypertension: Secondary | ICD-10-CM

## 2015-07-26 DIAGNOSIS — E669 Obesity, unspecified: Secondary | ICD-10-CM

## 2015-07-26 DIAGNOSIS — E785 Hyperlipidemia, unspecified: Secondary | ICD-10-CM

## 2015-07-26 DIAGNOSIS — I5189 Other ill-defined heart diseases: Secondary | ICD-10-CM | POA: Diagnosis present

## 2015-07-26 DIAGNOSIS — N183 Chronic kidney disease, stage 3 (moderate): Secondary | ICD-10-CM

## 2015-07-26 DIAGNOSIS — Z85528 Personal history of other malignant neoplasm of kidney: Secondary | ICD-10-CM

## 2015-07-26 DIAGNOSIS — C9000 Multiple myeloma not having achieved remission: Secondary | ICD-10-CM

## 2015-07-26 DIAGNOSIS — G8191 Hemiplegia, unspecified affecting right dominant side: Secondary | ICD-10-CM

## 2015-07-26 LAB — URINE CULTURE: Culture: NO GROWTH

## 2015-07-26 LAB — GLUCOSE, CAPILLARY
GLUCOSE-CAPILLARY: 74 mg/dL (ref 65–99)
Glucose-Capillary: 135 mg/dL — ABNORMAL HIGH (ref 65–99)
Glucose-Capillary: 110 mg/dL — ABNORMAL HIGH (ref 65–99)
Glucose-Capillary: 94 mg/dL (ref 65–99)
Glucose-Capillary: 133 mg/dL — ABNORMAL HIGH (ref 65–99)

## 2015-07-26 MED ORDER — LISINOPRIL 5 MG PO TABS
5.0000 mg | ORAL_TABLET | Freq: Every day | ORAL | Status: DC
  Administered 2015-07-26 – 2015-08-03 (×9): 5 mg via ORAL
  Filled 2015-07-26 (×9): qty 1

## 2015-07-26 MED ORDER — SODIUM CHLORIDE 0.9 % IV SOLN
100.0000 mg | Freq: Three times a day (TID) | INTRAVENOUS | Status: DC
  Administered 2015-07-26 – 2015-07-27 (×2): 100 mg via INTRAVENOUS
  Filled 2015-07-26 (×4): qty 2

## 2015-07-26 MED ORDER — FOSPHENYTOIN SODIUM 500 MG PE/10ML IJ SOLN
20.0000 mg/kg | Freq: Once | INTRAMUSCULAR | Status: AC
  Administered 2015-07-26: 1726 mg via INTRAVENOUS
  Filled 2015-07-26: qty 34.52

## 2015-07-26 NOTE — Progress Notes (Signed)
SLP Cancellation Note  Patient Details Name: Melissa [name] MRN: [id number] DOB: [birth year]   Cancelled treatment:       Reason Eval/Treat Not Completed: Medical issues which prohibited therapy. Given continued signs of active seizure activity, will hold swallow eval. Patient unable to verbalize or open oral cavity despite Total A multimodal cues. Will return for assessment when patient ready to participate.   Weston Anna, [place], [place]     [place], [place] 07/26/2015, 1:23 PM

## 2015-07-26 NOTE — Progress Notes (Signed)
STROKE TEAM PROGRESS NOTE   HISTORY Melissa [name] is an 69 y.o. 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 She presents to the emergency department with possible left-sided seizure. In ED she was found to have UTI and was admitted for treatment of UTI and ongoing jerking movements of the left arm and leg. patient received Ativan once while in ED which seemed to stop the movements per family. She was also loaded with 1000 mg Keppra and currently on 500 mg BID. The left arm and leg continues to have non-rhythmic jerking which could be myoclonus versus seizure. Neurology was asked to evaluate.   Patient was not considered for TPA secondary to non-stroke presentation, stroke not recognized.    SUBJECTIVE (INTERVAL HISTORY) Her daughters are at the bedside.  Overall she feels her condition has some improvement as per daughters. She had left MCA infarct due to left M1 cut off last September. Monday evening, she had seizure episode with sudden onset left UE and LE shaking and eyes rolling back, lasted about 3 min and gradually getting better, but the shaking did not resolve. She was put on keppra and EEG showed left sided seizure. keppra up to $Rem'1000mg'dcho$  bid. But pt still has right UE and LE intermittent rhythmic jerking, with periods spreading to the right LE. Will repeat EEG and order fosphenytoin.   OBJECTIVE Temp:  [98.1 F (36.7 C)-100.4 F (38 C)] 99.1 F (37.3 C) (08/03 1001) Pulse Rate:  [60-92] 85 (08/03 1001) Cardiac Rhythm:  [-] Normal sinus rhythm (08/03 1120) Resp:  [20-22] 20 (08/03 1001) BP: (117-166)/(60-102) 142/102 mmHg (08/03 1001) SpO2:  [98 %-100 %] 100 % (08/03 1001) Weight:  [86.274 kg (190 lb 3.2 oz)-88.542 kg (195 lb 3.2 oz)] 86.274 kg (190 lb 3.2 oz) (08/03 0500)   Recent Labs Lab 07/25/15 2144 07/25/15 2352  07/26/15 0404 07/26/15 0753 07/26/15 1153  GLUCAP 112* 101* 74 135* 110*    Recent Labs Lab 07/24/15 1506 07/25/15 0545  NA 140 137  K 3.7 3.6  CL 105 103  CO2 29 28  GLUCOSE 83 82  BUN 10 12  CREATININE 0.69 0.86  CALCIUM 9.7 9.5    Recent Labs Lab 07/25/15 0545  AST 18  ALT 20  ALKPHOS 40  BILITOT 0.6  PROT 7.3  ALBUMIN 2.8*    Recent Labs Lab 07/24/15 1506  WBC 7.2  HGB 12.7  HCT 36.3  MCV 87.3  PLT 271   No results for input(s): CKTOTAL, CKMB, CKMBINDEX, TROPONINI in the last 168 hours. No results for input(s): LABPROT, INR in the last 72 hours.  Recent Labs  07/24/15 1415  COLORURINE YELLOW  LABSPEC 1.011  PHURINE 7.0  GLUCOSEU NEGATIVE  HGBUR NEGATIVE  BILIRUBINUR NEGATIVE  KETONESUR NEGATIVE  PROTEINUR 30*  UROBILINOGEN 1.0  NITRITE NEGATIVE  LEUKOCYTESUR NEGATIVE       Component Value Date/Time   CHOL 140 04/03/2015 1122   TRIG 153* 04/03/2015 1122   HDL 37* 04/03/2015 1122   CHOLHDL 3.8 04/03/2015 1122   VLDL 31 04/03/2015 1122   LDLCALC 72 04/03/2015 1122   Lab Results  Component Value Date   HGBA1C 5.8* 04/03/2015   No results found for: LABOPIA, COCAINSCRNUR, LABBENZ, AMPHETMU, THCU, LABBARB  No results for input(s): ETH in the last 168 hours.  I have personally reviewed the radiological  images below and agree with the radiology interpretations. Blue text is my interpretation.  Dg Chest 1 View 07/24/2015   1. Left lower lobe atelectasis or pneumonia. 2. Mild right basilar atelectasis. 3. Stable cardiomegaly.     Ct Head Wo Contrast 07/24/2015    No acute intracranial abnormality. Stable bilateral old infarcts as described above. Stable mild cerebral atrophy.     Mr Brain Wo Contrast 07/24/2015   Old infarction in the left MCA territory affecting the posterior temporal lobe and parietal region having progressed to atrophy, encephalomalacia and gliosis. Areas of acute infarction representing extension of infarction in that  region. 2 punctate foci of acute infarction separate from that area in the left frontal lobe.  Extensive old infarctions elsewhere throughout the brain  Discussed with Dr. Vernard Gambles regarding the DWI findings. We are not convinced that is definite acute infarct as it may represent laminar necrosis or mineral deposit in the previous infarct region and CT earlier has shown the changes.   Ct Head Wo Contrast 09/18/2014 Large LEFT MCA territory infarct involving LEFT temporal and parietal lobes. No acute intracranial hemorrhage or midline shift. Old small high RIGHT frontal and questionable LEFT cerebellar infarcts.   Mr Brain Wo Contrast 09/19/2014 1. Large acute left MCA infarct. Cytotoxic edema and mass effect but no associated hemorrhage at this time. 2. Numerous scattered small acute infarcts in the bilateral MCA, right PCA, and bilateral PICA territories. Constellation most compatible with recent embolic event (cardiac or proximal aortic source).   Mr Jodene Nam Head Wo Contrast 09/19/2014 3. Left MCA occlusion in the M1 segment. Some reconstituted flow only in the anterior left MCA division. 4. No other major circle of Willis branch occlusion. Moderate irregularity of both ICA siphons.   TEE 09/23/14 - Intermittent large right to left shunt across a PFO, during quiet respiration - possible pathway for paradoxical embolism  Venous doppler LEs 09/23/14 - No evidence of deep vein or superficial thrombosis involving the right lower extremity and left lower extremity. - No evidence of Baker&'s cyst on the right or left.    Carotid Doppler  07/25/15 Bilateral: 1-39% ICA stenosis. Vertebral artery flow is antegrade on the right. The left vertebral artery flow could not be insonated due to involuntary movement of the body due to seizure type activity  2D Echocardiogram  07/25/15 - Left ventricle: The cavity size was mildly dilated. Wallthickness was normal. Systolic function was moderately reduced.The  estimated ejection fraction was in the range of 35% to 40%.Diffuse hypokinesis. Doppler parameters are consistent withabnormal left ventricular relaxation (grade 1 diastolicdysfunction). - Mitral valve: There was mild regurgitation. - Pulmonary arteries: Systolic pressure was moderately increased.PA peak pressure: 41 mm Hg (S).  EEG 07/25/15 1) Frequent left hemispheric seizures with a wide field 2) periodic lateralized epileptiform discharges associated with abnormal movements  Clinical Interpretation: This EEG is consistent with frequent seizures arising from the left hemisphere with a frontotemporal predominance.   Repeat EEG pending  Venous doppler pending   PHYSICAL EXAM  Temp:  [98.1 F (36.7 C)-100.4 F (38 C)] 99.1 F (37.3 C) (08/03 1001) Pulse Rate:  [60-92] 85 (08/03 1001) Resp:  [20-22] 20 (08/03 1001) BP: (117-166)/(60-102) 142/102 mmHg (08/03 1001) SpO2:  [98 %-100 %] 100 % (08/03 1001) Weight:  [190 lb 3.2 oz (86.274 kg)-195 lb 3.2 oz (88.542 kg)] 190 lb 3.2 oz (86.274 kg) (08/03 0500)  General - Well nourished, well developed, drowsy, sleepy, very lethargic, able to open eyes on voice stimulation and  pain stimulation, no language output, not following commands.  Ophthalmologic - Fundi not visualized due to noncooperation.  Cardiovascular - Regular rate and rhythm  Neuro - very lethargic, drowsy sleepy, able to open eyes on voice and pain stimulation, but not able to follow any commands, no language output. PERRL, visual neglect on the right side, blinking to visual threat on the left, left-sided eye gaze, right facial droop, hemiplegic on the right. RLE withdraw to pain but LUE and LLE spontaneous movement. There are rhythmic muscle contraction of left hand and left foot at every two seconds, and intermittently spreading to right foot.   ASSESSMENT/PLAN Melissa [name] is a 69 y.o. female with history of multiple myeloma, right sided hemiparesis from  previous left MCA large stroke, dysphagia status post PEG tube, who is bedbound, and speaks a few words at baseline presenting with seizures. She was not considered for IV t-PA due to non-stroke presentation.   Simple partial seizure - new onset  Please make left hand and left food jerking, spreading to right foot  EEG showed left-sided seizure activity with wide field  Repeat EEG today  Try to avoid benzo and intubation  On Keppra 1000 mg twice a day  Will load with fosphenytoin 20 mg PE/kg and with maintenance dose  Dilantin level daily  Stroke:  Chronic left posterior temporal/parietal infarct with laminar necrosis (doubt acute stroke, discussed with radiologist). Two punctate infarcts in L frontal areas. Infarcts felt to be embolic vs. Hypotension.   Resultant  baseline right hemiplegia  MRI  as above   MRA in 08/2014 showed right M1 cut off.   Carotid Doppler  No significant stenosis   2D Echo  EF 35-40% down from 08/2014 60-65%  TEE 09/2014 EF 65-70%, no SOE but large PFO  Venous doppler pending to rule out DVT  LDL 72  HgbA1c 5.8 in April  Lovenox 40 mg sq daily for VTE prophylaxis  Diet NPO time specified. ST swallow eval held d/t ongoing seizures  aspirin 162 mg orally every day prior to admission, now on aspirin 162 mg orally every day  Ongoing aggressive stroke risk factor management  Therapy recommendations:  pending   Followed by Dr. Merlene Laughter PTA  Disposition:  pending (bed bound at baseline, lives w/ dtr, [facility])  Hx stroke/TIA   Large left MCA infarct in setting of L MCA M1 occlusion, and numerous scattered small acute infarcts in the bilateral MCA, right PCA, and bilateral PICA territories, Consistent with embolic most likely secondary to either cardioembolic or hypercoagulable state from malignancy or chemo meds  TEE showed large PFO but no LE DVT  Loop recorder recommended but not put in  At this time, pt seems not a good candidate for  aggressive anticoagulation with coumadin, will need to discuss with daughter about goal of care.  UTI  Many bateria in UA but WBC 0-2  On rocephine  Culture NGTD  [place] with lowered seizure threshold   Malnutrition  Has PEG for poor oral intake, able to swallow at baseline Body mass index is 30.71 kg/(m^2).   Hypertension  Home meds:   Norvasc, coreg, clonidine  stable  Hyperlipidemia  Home meds:  mevacor 40 mg daily, resumed in hospital, though pt remains NPO awaiting seizure activity to stop prior to eval  LDL 72, goal < 70  Continue statin at discharge  Other Stroke Risk Factors  Advanced age  Obesity, Body mass index is 30.71 kg/(m^2).   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  Dr. Farrel Gobble - on Revlimid, known to have risk of PNA and stroke  Hospital day # 2  Rosalin Hawking, MD PhD Stroke Neurology 07/26/2015 4:23 PM     To contact Stroke Continuity provider, please refer to [email or web address] After hours, contact General Neurology

## 2015-07-26 NOTE — Progress Notes (Signed)
EEG completed, results pending. 

## 2015-07-26 NOTE — Care Management Important Message (Signed)
Important Message  Patient Details  Name: Melissa [name] MRN: [id number] Date of Birth: 11/26/1946   Medicare Important Message Given:  Yes-second notification given    Delorse Lek 07/26/2015, 3:12 PM

## 2015-07-26 NOTE — Progress Notes (Signed)
[facility] PROGRESS NOTE  Melissa [name] AVW:[id number] DOB: [birth year] DOA: 07/24/2015 PCP: [name], MD  Assessment/Plan:  Principal Problem:   Partial seizures continue. Neuro has added dilantin Active Problems:   Abnormal brain imaging: Dr. Roda Shutters has reviewed films and not convinced that is definite acute infarct as it may represent laminar necrosis or mineral deposit.   Urinary tract infection:  Culture negative. Will contine 5 days abx   Hypothyroid:  TSH in April borderline low. Repeat pending Systolic dysfunction: new. No evidence of CHF.  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 Family reports that she has PEG tube for poor oral intake but is able to swallow.   Left lower lobe pneumonia v atelectasis   Hemiplegia affecting right dominant side   Multiple myeloma  Code Status:  DO NOT RESUSCITATE Family Communication:  Daughters at bedside Disposition Plan:  Home when seizures controlled  Consultants:  Neurology  Procedures:     Antibiotics:    HPI/Subjective: Unable. Per daughters, has had continued left leg and arm jerking.   Objective: Filed Vitals:   07/26/15 1001  BP: 142/102  Pulse: 85  Temp: 99.1 F (37.3 C)  Resp: 20    Intake/Output Summary (Last 24 hours) at 07/26/15 1616 Last data filed at 07/26/15 0500  Gross per 24 hour  Intake    537 ml  Output      0 ml  Net    537 ml   Filed Weights   07/25/15 1700 07/26/15 0500  Weight: 88.542 kg (195 lb 3.2 oz) 86.274 kg (190 lb 3.2 oz)    Exam:   General:  Nonverbal. Wont track  Cardiovascular: Regular rate rhythm without murmurs gallops rubs  Respiratory: Clear to auscultation bilaterally without wheezes rhonchi or rales  Abdomen: Soft nontender nondistended  Ext: No edema. Right foot with foot  drop boot.  Neurologic: Right hemiparesis. Left leg jerks with spasticity. Does not involve arm today.  Basic Metabolic Panel:  Recent Labs Lab 07/24/15 1506 07/25/15 0545  NA 140 137  K 3.7 3.6  CL 105 103  CO2 29 28  GLUCOSE 83 82  BUN 10 12  CREATININE 0.69 0.86  CALCIUM 9.7 9.5   Liver Function Tests:  Recent Labs Lab 07/25/15 0545  AST 18  ALT 20  ALKPHOS 40  BILITOT 0.6  PROT 7.3  ALBUMIN 2.8*   No results for input(s): LIPASE, AMYLASE in the last 168 hours. No results for input(s): AMMONIA in the last 168 hours. CBC:  Recent Labs Lab 07/24/15 1506  WBC 7.2  HGB 12.7  HCT 36.3  MCV 87.3  PLT 271   Cardiac Enzymes: No results for input(s): CKTOTAL, CKMB, CKMBINDEX, TROPONINI in the last 168 hours. BNP (last 3 results) No results for input(s): BNP in the last 8760 hours.  ProBNP (last 3 results)  Recent Labs  09/19/14 1004  PROBNP 1843.0*    CBG:  Recent Labs Lab 07/25/15 2144 07/25/15 2352 07/26/15 0404 07/26/15 0753 07/26/15 1153  GLUCAP 112* 101* 74 135* 110*    Recent Results (from the past 240 hour(s))  Culture, Urine     Status: None   Collection Time: 07/24/15  2:15 PM  Result Value Ref Range Status   Specimen Description URINE, RANDOM  Final  Special Requests CX ADDED AT 0215 ON 161096  Final   Culture NO GROWTH 1 DAY  Final   Report Status 08/12/2015 FINAL  Final  Culture, blood (routine x 2)     Status: None (Preliminary result)   Collection Time: 07/24/15  5:20 PM  Result Value Ref Range Status   Specimen Description BLOOD RIGHT ARM  Final   Special Requests BOTTLES DRAWN AEROBIC AND ANAEROBIC 10CC  Final   Culture NO GROWTH 2 DAYS  Final   Report Status PENDING  Incomplete  Culture, blood (routine x 2)     Status: None (Preliminary result)   Collection Time: 07/24/15  5:25 PM  Result Value Ref Range Status   Specimen Description BLOOD RIGHT HAND  Final   Special Requests BOTTLES DRAWN AEROBIC ONLY 10CC  Final    Culture NO GROWTH 2 DAYS  Final   Report Status PENDING  Incomplete     Studies: Dg Elbow 2 Views Right  Aug 12, 2015   CLINICAL DATA:  Right elbow pain, limited range of motion, no further history is available  EXAM: RIGHT ELBOW - 2 VIEW  COMPARISON:  Elbow series of Apr 25, 2015  FINDINGS: The bones are osteopenic. The radial head is intact. The olecranon is grossly intact. The condylar and supracondylar regions of the distal humerus are intact where visualized. There is soft tissue calcification along the posterior aspect of the joint. There is a posterior fat pad sign consistent with an effusion. There is a tiny olecranon spur.  IMPRESSION: No definite acute fracture or dislocation is observed. There is a joint effusion. Degenerative changes associated with the posterior aspect of the elbow joint are present. There has not been significant interval change since the previous study.   Electronically Signed   By: David  [place] M.D.   On: 08/12/15 16:07   Mr Brain Wo Contrast  07/24/2015   CLINICAL DATA:  Chronic right sided weakness from previous stroke. Left-sided seizure today.  EXAM: MRI HEAD WITHOUT CONTRAST  TECHNIQUE: Multiplanar, multiecho pulse sequences of the brain and surrounding structures were obtained without intravenous contrast.  COMPARISON:  Head CT same day.  MRI 09/19/2014.  FINDINGS: The study suffers from motion degradation. There chronic small-vessel ischemic changes throughout the pons. There are numerous old cerebellar infarctions bilaterally. No acute cerebellar insult. There is old infarction in the left MCA territory affecting the posterior temporal lobe and parietal lobe on the left . This has progressed to atrophy and encephalomalacia with evidence of old laminar necrosis. In the region of old infarction, there are areas of acute extension. There are 1 or 2 punctate foci of acute infarction elsewhere in the left frontal lobe. No evidence of swelling or acute hemorrhage. Chronic  small vessel ischemic changes affect the cerebral hemisphere white matter bilaterally. There is an old right frontoparietal infarction at the vertex. No evidence of mass lesion, hydrocephalus or extra-axial collection. Major vessels at the base of the brain show flow.  IMPRESSION: Old infarction in the left MCA territory affecting the posterior temporal lobe and parietal region having progressed to atrophy, encephalomalacia and gliosis. Areas of acute infarction representing extension of infarction in that region. 2 punctate foci of acute infarction separate from that area in the left frontal lobe.  Extensive old infarctions elsewhere throughout the brain as outlined above.   Electronically Signed   By: Paulina Fusi M.D.   On: 07/24/2015 20:05   Dg Hip Unilat With Pelvis 2-3 Views Left  07/24/2015  CLINICAL DATA:  Left hip pain following a seizure today.  EXAM: DG HIP (WITH OR WITHOUT PELVIS) 2-3V LEFT  COMPARISON:  CT scan 11/06/2014  FINDINGS: Both hips are normally located. Mild degenerative changes but no acute hip fracture. No definite pelvic fractures. Vascular calcifications are noted.  IMPRESSION: No definite acute hip or pelvic fractures.   Electronically Signed   By: Rudie Meyer M.D.   On: 07/24/2015 19:13    Scheduled Meds: . amLODipine  10 mg Per Tube q morning - 10a  . aspirin  162 mg Per Tube Daily  . carvedilol  6.25 mg Per Tube BID WC  . cefTRIAXone (ROCEPHIN)  IV  1 g Intravenous Q24H  . cloNIDine  0.3 mg Transdermal Weekly  . DULoxetine  30 mg Oral BID  . enoxaparin (LOVENOX) injection  40 mg Subcutaneous Q24H  . feeding supplement (OSMOLITE 1.5 CAL)  237 mL Per Tube 5 X Daily  . feeding supplement (PRO-STAT SUGAR FREE 64)  30 mL Per Tube BID  . fosPHENYtoin (CEREBYX) IV  20 mg PE/kg Intravenous Once  . fosPHENYtoin (CEREBYX) IV  100 mg PE Intravenous 3 times per day  . free water  100 mL Per Tube 6 times per day  . levETIRAcetam  1,000 mg Per Tube BID  . levothyroxine  75 mcg  Per Tube QAC breakfast  . pravastatin  40 mg Oral q1800  . sodium chloride  3 mL Intravenous Q12H  . sorbitol  30 mL Per Tube Daily   Continuous Infusions:   Time spent: 35 minutes  Aalaysia Liggins L  [facility] [email or web address], password [facility] 07/26/2015, 4:16 PM  LOS: 2 days

## 2015-07-26 NOTE — Procedures (Signed)
History: 69 yo F with abnormal movements.   Sedation: None  Technique: This is a 19 channel routine scalp EEG performed at the bedside with bipolar and monopolar montages arranged in accordance to the international 10/20 system of electrode placement. One channel was dedicated to EKG recording.    Background: There is a posterior dominant rhythm of 8 Hz. There are again quasi-periodic 1 Hz discharges seen over the left hemisphere with a wide field. When these occur, there is clear movement of the LEFT arm and leg at the same time.   Sometimes, the appearance is most often that of a  sharp/slow wave though the sharp component is not always visible. . These occur throughout the recording.   In addition, there are at least three runs of increased slow activity in the left hemisphere which evolves from rhythmic theta to delta with abrupt cessation which appears consistent with partial seizures.   Photic stimulation: Physiologic driving is not performed  EEG Abnormalities: 1) Frequent left hemispheric seizures with a wide field 2) periodic lateralized epileptiform discharges(PLEDs) associated with abnormal movements   Clinical Interpretation: This EEG is consistent with frequent seizures arising from the left hemisphere with a frontotemporal predominance. PLEDs can be ictal in nature, representing ongoing seizure activity.   Roland Rack, MD [facility] [phone]  If 7pm- 7am, please page neurology on call as listed in [place].

## 2015-07-27 ENCOUNTER — Inpatient Hospital Stay (HOSPITAL_COMMUNITY): Payer: Medicare Other

## 2015-07-27 DIAGNOSIS — I6350 Cerebral infarction due to unspecified occlusion or stenosis of unspecified cerebral artery: Secondary | ICD-10-CM

## 2015-07-27 DIAGNOSIS — I639 Cerebral infarction, unspecified: Secondary | ICD-10-CM

## 2015-07-27 DIAGNOSIS — R569 Unspecified convulsions: Secondary | ICD-10-CM | POA: Diagnosis present

## 2015-07-27 DIAGNOSIS — M25521 Pain in right elbow: Secondary | ICD-10-CM

## 2015-07-27 DIAGNOSIS — I519 Heart disease, unspecified: Secondary | ICD-10-CM

## 2015-07-27 LAB — BASIC METABOLIC PANEL
ANION GAP: 4 — AB (ref 5–15)
BUN: 17 mg/dL (ref 6–20)
CHLORIDE: 105 mmol/L (ref 101–111)
CO2: 29 mmol/L (ref 22–32)
Calcium: 9.2 mg/dL (ref 8.9–10.3)
Creatinine, Ser: 0.77 mg/dL (ref 0.44–1.00)
GFR calc Af Amer: >60 mL/min (ref >60)
GFR calc non Af Amer: >60 mL/min (ref >60)
Glucose, Bld: 133 mg/dL — ABNORMAL HIGH (ref 65–99)
Potassium: 3.4 mmol/L — ABNORMAL LOW (ref 3.5–5.1)
Sodium: 138 mmol/L (ref 135–145)

## 2015-07-27 LAB — CBC
HCT: 31.7 % — ABNORMAL LOW (ref 36.0–46.0)
Hemoglobin: 11 g/dL — ABNORMAL LOW (ref 12.0–15.0)
MCH: 30.6 pg (ref 26.0–34.0)
MCHC: 34.7 g/dL (ref 30.0–36.0)
MCV: 88.1 fL (ref 78.0–100.0)
Platelets: 271 10*3/uL (ref 150–400)
RBC: 3.6 MIL/uL — AB (ref 3.87–5.11)
RDW: 17.3 % — ABNORMAL HIGH (ref 11.5–15.5)
WBC: 6.9 10*3/uL (ref 4.0–10.5)

## 2015-07-27 LAB — GLUCOSE, CAPILLARY
Glucose-Capillary: 114 mg/dL — ABNORMAL HIGH (ref 65–99)
Glucose-Capillary: 121 mg/dL — ABNORMAL HIGH (ref 65–99)
Glucose-Capillary: 148 mg/dL — ABNORMAL HIGH (ref 65–99)
Glucose-Capillary: 170 mg/dL — ABNORMAL HIGH (ref 65–99)
Glucose-Capillary: 173 mg/dL — ABNORMAL HIGH (ref 65–99)
Glucose-Capillary: 86 mg/dL (ref 65–99)
Glucose-Capillary: 97 mg/dL (ref 65–99)

## 2015-07-27 LAB — PHENYTOIN LEVEL, TOTAL: Phenytoin Lvl: 17.4 ug/mL (ref 10.0–20.0)

## 2015-07-27 MED ORDER — SODIUM CHLORIDE 0.9 % IV SOLN
200.0000 mg | Freq: Once | INTRAVENOUS | Status: AC
  Administered 2015-07-27: 200 mg via INTRAVENOUS
  Filled 2015-07-27: qty 20

## 2015-07-27 MED ORDER — PHENYTOIN 50 MG PO CHEW
100.0000 mg | CHEWABLE_TABLET | Freq: Three times a day (TID) | ORAL | Status: DC
  Administered 2015-07-27 – 2015-08-01 (×18): 100 mg via ORAL
  Filled 2015-07-27 (×21): qty 2

## 2015-07-27 MED ORDER — POTASSIUM CHLORIDE 20 MEQ/15ML (10%) PO SOLN
40.0000 meq | Freq: Once | ORAL | Status: AC
  Administered 2015-07-27: 40 meq
  Filled 2015-07-27: qty 30

## 2015-07-27 MED ORDER — LACOSAMIDE 50 MG PO TABS
100.0000 mg | ORAL_TABLET | Freq: Two times a day (BID) | ORAL | Status: DC
  Administered 2015-07-27 – 2015-08-03 (×14): 100 mg via ORAL
  Filled 2015-07-27 (×14): qty 2

## 2015-07-27 NOTE — Progress Notes (Signed)
STROKE TEAM PROGRESS NOTE   HISTORY Melissa [name] is an 69 y.o. 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 She presents to the emergency department with possible left-sided seizure. In ED she was found to have UTI and was admitted for treatment of UTI and ongoing jerking movements of the left arm and leg. patient received Ativan once while in ED which seemed to stop the movements per family. She was also loaded with 1000 mg Keppra and currently on 500 mg BID. The left arm and leg continues to have non-rhythmic jerking which could be myoclonus versus seizure. Neurology was asked to evaluate.   Patient was not considered for TPA secondary to non-stroke presentation, stroke not recognized.    SUBJECTIVE (INTERVAL HISTORY) Multiple family members present. The patient makes no attempt to communicate although she appears alert. Dr. Erlinda Hong discussed the likelihood that her strokes are embolic in origin. The patient had a 30 day event monitor in July; however, no strips are available for review. The patient's daughters would like to be aggressive with the patient's workup and treatment. Dr. Erlinda Hong discussed anticoagulation and possible loop placement. The daughters appear to be in favor of this plan. We will consult cardiology to see if they feel loop placement is indicated. She continues to have mild seizure activity in the left lower extremity.  OBJECTIVE Temp:  [98 F (36.7 C)-100.1 F (37.8 C)] 99.5 F (37.5 C) (08/04 1043) Pulse Rate:  [71-87] 73 (08/04 1043) Cardiac Rhythm:  [-] Normal sinus rhythm (08/04 1100) Resp:  [16-20] 20 (08/04 1043) BP: (140-161)/(56-71) 153/62 mmHg (08/04 1043) SpO2:  [99 %-100 %] 100 % (08/04 1043) Weight:  [89.268 kg (196 lb 12.8 oz)] 89.268 kg (196 lb 12.8 oz) (08/04 0400)   Recent Labs Lab 07/26/15 1948  07/26/15 2358 07/27/15 0357 07/27/15 0803 07/27/15 1130  GLUCAP 133* 86 97 170* 148*    Recent Labs Lab 07/24/15 1506 07/25/15 0545 07/27/15 1110  NA 140 137 138  K 3.7 3.6 3.4*  CL 105 103 105  CO2 $Re'29 28 29  'iEs$ GLUCOSE 83 82 133*  BUN $Re'10 12 17  'YCP$ CREATININE 0.69 0.86 0.77  CALCIUM 9.7 9.5 9.2    Recent Labs Lab 07/25/15 0545  AST 18  ALT 20  ALKPHOS 40  BILITOT 0.6  PROT 7.3  ALBUMIN 2.8*    Recent Labs Lab 07/24/15 1506 07/27/15 1110  WBC 7.2 6.9  HGB 12.7 11.0*  HCT 36.3 31.7*  MCV 87.3 88.1  PLT 271 271   No results for input(s): CKTOTAL, CKMB, CKMBINDEX, TROPONINI in the last 168 hours. No results for input(s): LABPROT, INR in the last 72 hours.  Recent Labs  07/24/15 1415  COLORURINE YELLOW  LABSPEC 1.011  PHURINE 7.0  GLUCOSEU NEGATIVE  HGBUR NEGATIVE  BILIRUBINUR NEGATIVE  KETONESUR NEGATIVE  PROTEINUR 30*  UROBILINOGEN 1.0  NITRITE NEGATIVE  LEUKOCYTESUR NEGATIVE       Component Value Date/Time   CHOL 140 04/03/2015 1122   TRIG 153* 04/03/2015 1122   HDL 37* 04/03/2015 1122   CHOLHDL 3.8 04/03/2015 1122   VLDL 31 04/03/2015 1122   LDLCALC 72 04/03/2015 1122   Lab Results  Component Value Date   HGBA1C 5.8* 04/03/2015   No results found for: LABOPIA, COCAINSCRNUR, LABBENZ, AMPHETMU, THCU, LABBARB  No results for input(s): ETH in the last  168 hours.  I have personally reviewed the radiological images below and agree with the radiology interpretations. Blue text is my interpretation.  Dg Chest 1 View 07/24/2015   1. Left lower lobe atelectasis or pneumonia. 2. Mild right basilar atelectasis. 3. Stable cardiomegaly.     Ct Head Wo Contrast 07/24/2015    No acute intracranial abnormality. Stable bilateral old infarcts as described above. Stable mild cerebral atrophy.     Mr Brain Wo Contrast 07/24/2015   Old infarction in the left MCA territory affecting the posterior temporal lobe and parietal region having progressed to atrophy,  encephalomalacia and gliosis. Areas of acute infarction representing extension of infarction in that region. 2 punctate foci of acute infarction separate from that area in the left frontal lobe.  Extensive old infarctions elsewhere throughout the brain  Discussed with Dr. Vernard Gambles regarding the DWI findings. We are not convinced that is definite acute infarct as it may represent laminar necrosis or mineral deposit in the previous infarct region and CT earlier has shown the changes.   Ct Head Wo Contrast 09/18/2014 Large LEFT MCA territory infarct involving LEFT temporal and parietal lobes. No acute intracranial hemorrhage or midline shift. Old small high RIGHT frontal and questionable LEFT cerebellar infarcts.   Mr Brain Wo Contrast 09/19/2014 1. Large acute left MCA infarct. Cytotoxic edema and mass effect but no associated hemorrhage at this time. 2. Numerous scattered small acute infarcts in the bilateral MCA, right PCA, and bilateral PICA territories. Constellation most compatible with recent embolic event (cardiac or proximal aortic source).   Mr Jodene Nam Head Wo Contrast 09/19/2014 3. Left MCA occlusion in the M1 segment. Some reconstituted flow only in the anterior left MCA division. 4. No other major circle of Willis branch occlusion. Moderate irregularity of both ICA siphons.   Right Elbow 2 view x-ray 07/26/2015 No definite acute fracture or dislocation is observed.  There is a joint effusion.  Degenerative changes associated with the posterior aspect of the elbow joint are present.  There has not been significant interval change since the previous study.  TEE 09/23/14 - Intermittent large right to left shunt across a PFO, during quiet respiration - possible pathway for paradoxical embolism  Venous doppler LEs 09/23/14 - No evidence of deep vein or superficial thrombosis involving the right lower extremity and left lower extremity. - No evidence of Baker&'s cyst on the right or left.     Carotid Doppler  07/25/15 Bilateral: 1-39% ICA stenosis. Vertebral artery flow is antegrade on the right. The left vertebral artery flow could not be insonated due to involuntary movement of the body due to seizure type activity  2D Echocardiogram  07/25/15 - Left ventricle: The cavity size was mildly dilated. Wallthickness was normal. Systolic function was moderately reduced.The estimated ejection fraction was in the range of 35% to 40%.Diffuse hypokinesis. Doppler parameters are consistent withabnormal left ventricular relaxation (grade 1 diastolicdysfunction). - Mitral valve: There was mild regurgitation. - Pulmonary arteries: Systolic pressure was moderately increased.PA peak pressure: 41 mm Hg (S).  EEG 07/25/15 1) Frequent left hemispheric seizures with a wide field 2) periodic lateralized epileptiform discharges associated with abnormal movements  Clinical Interpretation: This EEG is consistent with frequent seizures arising from the left hemisphere with a frontotemporal predominance.   EEG 07/26/15 - EEG Abnormalities: 1) periodic lateralized epileptiform discharges(PLEDs)  Clinical Interpretation: This EEG is most consistent with an area of irritability which could serve as a focus for seizures in the left hemisphere. There were no definite  periods of evolution signifying definite seizure on todays study. PLEDs are indeterminate and can be post-ictal or associated with an area of injury, but can be ictal in some circumstances.   Venous doppler - No evidence of DVT or superficial thrombosis at UEs and LEs.   PHYSICAL EXAM  Temp:  [98 F (36.7 C)-100.1 F (37.8 C)] 99.5 F (37.5 C) (08/04 1043) Pulse Rate:  [71-87] 73 (08/04 1043) Resp:  [16-20] 20 (08/04 1043) BP: (140-161)/(56-71) 153/62 mmHg (08/04 1043) SpO2:  [99 %-100 %] 100 % (08/04 1043) Weight:  [89.268 kg (196 lb 12.8 oz)] 89.268 kg (196 lb 12.8 oz) (08/04 0400)  General - Well nourished, well developed, awake  with eyes open, tracking to people, no language output, not following commands.  Ophthalmologic - Fundi not visualized due to noncooperation.  Cardiovascular - Regular rate and rhythm  Neuro - awake, eyes open, but not able to follow any commands, no language output. PERRL, visual neglect on the right side, blinking to visual threat on the left, left-sided eye gaze, right facial droop, hemiplegic on the right. RLE withdraw to pain but LUE and LLE spontaneous movement. There are rhythmic muscle contraction of left hand and left foot at every two seconds, and intermittently spreading to right foot.   ASSESSMENT/PLAN Ms. Rosary Filosa Luevano is a 69 y.o. female with history of multiple myeloma, right sided hemiparesis from previous left MCA large stroke, dysphagia status post PEG tube, who is bedbound, and speaks a few words at baseline presenting with seizures. She was not considered for IV t-PA due to non-stroke presentation.   Simple partial seizure - new onset  Please make left hand and left food jerking, spreading to right foot  EEG showed left-sided seizure activity with wide field  Repeat EEG 07/26/2015 - PLEDs  Try to avoid benzo and intubation  On Keppra 1000 mg twice a day.   Vimpat added today, 07/27/2015 for continued LLE mild seizure activity.  Change fosphenytoin to PO dilantin  Dilantin level daily 17.4 on Thursday, 07/27/2015, corrected dilantin 19.1  Stroke:  Chronic left posterior temporal/parietal infarct with laminar necrosis (doubt acute stroke, discussed with radiologist). Two punctate infarcts in L frontal areas. Infarcts felt to be embolic vs. Hypotension.   Resultant  baseline right hemiplegia  MRI  as above   MRA in 08/2014 showed right M1 cut off.   Carotid Doppler  No significant stenosis   2D Echo  EF 35-40% down from 08/2014 60-65%  TEE 09/2014 EF 65-70%, no SOE but large PFO  Venous doppler - No evidence of DVT or superficial thrombosis.  Family  would like aggressive work up but EP consulted dose not think she is a good candidate for loop recorder. Also pt at this time is not a good candidate for anticoagulation.   LDL 72  HgbA1c 5.8 in April  Lovenox 40 mg sq daily for VTE prophylaxis   . ST swallow eval held d/t ongoing seizures  aspirin 162 mg orally every day prior to admission, now on aspirin 162 mg orally every day  Ongoing aggressive stroke risk factor management  Therapy recommendations:  pending   Followed by Dr. Merlene Laughter PTA  Disposition:  pending (bed bound at baseline, lives w/ dtr, [facility])  Hx stroke/TIA   Large left MCA infarct in setting of L MCA M1 occlusion, and numerous scattered small acute infarcts in the bilateral MCA, right PCA, and bilateral PICA territories, Consistent with embolic most likely secondary to either cardioembolic  or hypercoagulable state from malignancy or chemo meds  TEE showed large PFO but no LE DVT  Loop recorder recommended but not put in - cardiology consult today but does not think patient is a candidate for loop placement.  At this time, pt seems not a good candidate for aggressive anticoagulation with coumadin, she will need follow up with Dr. Merlene Laughter as outpt.   UTI  Many bateria in UA but WBC 0-2  On rocephine Day # 3  Fever of 100.1 last night, 07/26/2015.  Culture NGTD  May contribute to lowered seizure threshold   Malnutrition  Has PEG for poor oral intake, able to swallow at baseline Body mass index is 31.78 kg/(m^2).   Hypertension  Home meds:   Norvasc, coreg, clonidine  stable  Hyperlipidemia  Home meds:  mevacor 40 mg daily, resumed in hospital, though pt remains NPO awaiting seizure activity to stop prior to eval  LDL 72, goal < 70  Continue statin at discharge  Other Stroke Risk Factors  Advanced age  Obesity, Body mass index is 31.78 kg/(m^2).   Other Active Problems  Hypothyroid  CKD stage III  dpression  COPD  CHF,  pulmonary HTN  Right elbow x-ray - 07/26/2015 - effusion. No fracture.  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  Dr. Farrel Gobble - on Revlimid, known to have risk of PNA and stroke   Hospital day # 3   Mikey Bussing [facility] Pager [phone] 07/27/2015, 1:49 PM  69 yo F with hx of multiple stroke, bedbound at home was admitted for seizure activity. EEG confirmed seizure from left hemisphere, but pt presented with bilateral shaking jerking and L>R. Put on dilantin, keppra, and vimpat. Seizure under control. MRI showed 2 punctate left MCA infarcts, concerning for embolic vs. Hypotension as pt has left M1 cut off. However, pt does not appear to be candidate for loop or anticoagulation due to poor functional status and mRS = 4. Will continue medical management to stabilize pt and pt will follow up with Dr. Merlene Laughter.   Rosalin Hawking, MD PhD Stroke Neurology 07/27/2015 9:29 PM   To contact Stroke Continuity provider, please refer to [email or web address] After hours, contact General Neurology

## 2015-07-27 NOTE — Evaluation (Signed)
Clinical/Bedside Swallow Evaluation Patient Details  Name: Melissa [name] MRN: [id number] Date of Birth: 17-Jun-1946  Today's Date: 07/27/2015 Time: SLP Start Time (ACUTE ONLY): 0912 SLP Stop Time (ACUTE ONLY): 0939 SLP Time Calculation (min) (ACUTE ONLY): 27 min  Past Medical History:  Past Medical History  Diagnosis Date  . Vertigo, intermittent   . Hypothyroidism   . Hyperlipidemia   . Obesity   . Hypertension     severe and resistant to treatment; 2008-negative left renal angiogram  . Vitiligo   . Degenerative disc disease, lumbar   . Degenerative disc disease, cervical   . Chest pain 2008    2008-normal coronary angiography  . 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 Renal cell carcinoma     Right nephrectomy  . Multiple myeloma 08/26/2014   Past Surgical History:  Past Surgical History  Procedure Laterality Date  . Tubal ligation  1983    Bilateral  . Nephrectomy      Right; secondary to renal cell carcinoma  . Colonoscopy N/A 04/21/2014    Procedure: COLONOSCOPY;  Surgeon: Malissa Hippo, MD;  Location: [facility];  Service: Endoscopy;  Laterality: N/A;  . Colonoscopy N/A 04/21/2014    Procedure: COLONOSCOPY;  Surgeon: Malissa Hippo, MD;  Location: [facility];  Service: Endoscopy;  Laterality: N/A;  1030  . Tee without cardioversion N/A 09/23/2014    Procedure: TRANSESOPHAGEAL ECHOCARDIOGRAM (TEE);  Surgeon: Thurmon Fair, MD;  Location: [facility] ENDOSCOPY;  Service: Cardiovascular;  Laterality: N/A;   HPI:  69 year old female with right-sided hemiparesis secondary to stroke, dysphagia s/p PEG, History of multiple myeloma, bedbound presented with twitching on her left leg followed by an unresponsive episode.  Pt was seen in 2015, arousal was a barrier to swallowing. PEG was placed. Family reports to SLP that pt speaks a few words at a time, she also had a FEES at SNF prior to d/c home, consumes purees and thin liquids but pt has refused POs in recent past. She has  not noticed coughing there have been no complaints of pain other than her left hip pain which has become severe in the past 2 weeks.  MRI shows old infarction in the left MCA territory affecting the posterior temporal lobe and parietal region having progressed to atrophy, encephalomalacia and gliosis. Areas of acute infarction representing extension of infarction in that region. 2 punctate foci of acute infarction separate from that area in the left frontal lobe.     Assessment / Plan / Recommendation Clinical Impression  Pt presents with improved mentation compared to yesterday and family reports no seizures since 1630 8/3.  Pt is sleepy but accepted intake orally provided by SLP.  Decreased labial seal noted with delayed oral transiting = appearance of lingual pumping.  Pharyngeal swallow suspected to be delayed but there were not indications of airway compromise.  Pt with phonation x1 that was weak but clear *no indication of laryngeal penetration.    Recommend consider allowing pt small amounts of puree/thin via tsp given by family for comfort only and use of PEG for nutritional support.  Educated family to strict aspiration precautions use of oral suction using demonstration for reinforcement.  Reviewed goal of intake for enjoyment and to maintain swallow musculature and not nutritional support.  Further advised family if pt is coughing, orally holding or presenting with wet voice to cease intake.    Will follow up , do not recommend MBS or FEES at this time as anticipate  swallow function to return to normal with recovery from seizure.      Aspiration Risk  Moderate    Diet Recommendation  (floor stock of puree/thin via tsp allowed only, no trays )   Medication Administration: Via alternative means Compensations: Small sips/bites;Slow rate;Check for pocketing (assure pt swallows before giving more)    Other  Recommendations Oral Care Recommendations: Oral care BID   Follow Up  Recommendations    TBD   Frequency and Duration min 1 x/week  1 week   Pertinent Vitals/Pain Low grade temp, decreased     Swallow Study Prior Functional Status    pt with poor intake prior to admit, only few bites/sips   General Date of Onset: 07/27/15 Other Pertinent Information: 69 year old female with right-sided hemiparesis secondary to stroke, dysphagia s/p PEG, History of multiple myeloma, bedbound presented with twitching on her left leg followed by an unresponsive episode.  Pt was seen in 2015, arousal was a barrier to swallowing. PEG was placed. Family reports to SLP that pt speaks a few words at a time, she also had a FEES at SNF prior to d/c home, consumes purees and thin liquids but pt has refused POs in recent past. She has not noticed coughing there have been no complaints of pain other than her left hip pain which has become severe in the past 2 weeks.  MRI shows old infarction in the left MCA territory affecting the posterior temporal lobe and parietal region having progressed to atrophy, encephalomalacia and gliosis. Areas of acute infarction representing extension of infarction in that region. 2 punctate foci of acute infarction separate from that area in the left frontal lobe.   Type of Study: Bedside swallow evaluation Diet Prior to this Study: NPO Temperature Spikes Noted: Yes Respiratory Status: Room air History of Recent Intubation: No Behavior/Cognition: Lethargic/Drowsy;Doesn't follow directions Oral Cavity - Dentition:  (few dentition only) Self-Feeding Abilities: Total assist Patient Positioning: Upright in bed Baseline Vocal Quality: Other (comment) (pt phonated x1 with weak albeit clear vocal quality, unintelligible output) Volitional Cough: Cognitively unable to elicit Volitional Swallow: Unable to elicit    Oral/Motor/Sensory Function Overall Oral Motor/Sensory Function: Impaired at baseline (pt did not follow commands, decreased lip closure, gross  weakness)   Ice Chips Ice chips: Not tested   Thin Liquid Thin Liquid: Impaired Presentation: Spoon Oral Phase Impairments: Reduced labial seal;Reduced lingual movement/coordination;Impaired anterior to posterior transit Oral Phase Functional Implications: Other (comment) (lingual pumping) Pharyngeal  Phase Impairments: Suspected delayed Swallow    Nectar Thick Nectar Thick Liquid: Not tested   Honey Thick Honey Thick Liquid: Not tested   Puree Puree: Impaired Presentation: Spoon Oral Phase Impairments: Reduced labial seal;Reduced lingual movement/coordination;Impaired anterior to posterior transit Oral Phase Functional Implications: Prolonged oral transit Pharyngeal Phase Impairments: Suspected delayed Swallow   Solid   GO    Solid: Not tested       Donavan Burnet, MS [facility] SLP [phone]

## 2015-07-27 NOTE — Progress Notes (Signed)
EEG Completed; Results Pending  

## 2015-07-27 NOTE — Progress Notes (Signed)
*  PRELIMINARY RESULTS* Vascular Ultrasound Bilateral Lower Extremity Venous Duplex and Bilateral Upper Extremity Venous Duplex has been completed.  Preliminary findings:    No evidence of DVT or superficial thrombosis.     Landry Mellow, RDMS, RVT  07/27/2015, 10:08 AM

## 2015-07-27 NOTE — Procedures (Signed)
History: 69 yo F with abnormal movements.   Sedation: None  Technique: This is a 19 channel routine scalp EEG performed at the bedside with bipolar and monopolar montages arranged in accordance to the international 10/20 system of electrode placement. One channel was dedicated to EKG recording.    Background: There is a posterior dominant rhythm of 8 Hz. There are again quasi-periodic 1 Hz discharges seen over the left hemisphere with a wide field. There is also irregular delta and theta activity on the left as well.   Photic stimulation: Physiologic driving is not performed  EEG Abnormalities: 1) periodic lateralized epileptiform discharges(PLEDs)    Clinical Interpretation: This EEG is most consistent with an area of irritability which could serve as a focus for seizures in the left hemisphere. There were no definite periods of evolution signifying definite seizure on todays study. PLEDs  are indeterminate and can be post-ictal or associated with an area of injury, but can be ictal in some circumstances.   Roland Rack, MD [facility] [phone]  If 7pm- 7am, please page neurology on call as listed in [place].

## 2015-07-27 NOTE — Progress Notes (Signed)
Nutrition Follow-up   INTERVENTION:   Continue providing 1 can (276m) of Osmolite 1.5 via PEG 5 times daily with 30 ml Prostat via PEG BID.   Tube feeding regimen provides 1978 kcal (100% of needs), 104 grams of protein, and 900 ml of H2O.   Continue 100 ml free water flushes every 4 hours to provide an additional 600 ml of fluid daily    NUTRITION DIAGNOSIS:   Inadequate oral intake related to dysphagia as evidenced by NPO status.  Ongoing  GOAL:   Patient will meet greater than or equal to 90% of their needs  Being Met  MONITOR:   TF tolerance, Skin, I & O's, Labs, Weight trends  REASON FOR ASSESSMENT:   Consult Enteral/tube feeding initiation and management  ASSESSMENT:   69year old female with right-sided hemiparesis secondary to stroke, dysphagia s/p PEG, bedbound presented with twitching on her left leg followed by an unresponsive episode.   Pt was re-weighed and found to be down 44 lbs from 3 months ago- 18% weight loss in 3 months. Despite weight loss, pt appears fairly well-nourished. Pt is tolerating TF regimen of 5 cans of Osmolite 1.5 daily and Pro-stat twice daily via PEG. Pt was assessed by SLP and approved for small amounts of pureed foods for comfort.   Labs reviewed.   Diet Order:  Diet NPO time specified  Skin:  Wound (see comment) (abrasion on buttock)  Last BM:  8/3  Height:   Ht Readings from Last 1 Encounters:  07/24/15 5' 6" (1.676 m)    Weight:   Wt Readings from Last 1 Encounters:  07/27/15 196 lb 12.8 oz (89.268 kg)    Ideal Body Weight:  59 kg  BMI:  Body mass index is 31.78 kg/(m^2).  Estimated Nutritional Needs:   Kcal:  1750-2000  Protein:  100-120 grams  Fluid:  1.8-2 L/day  EDUCATION NEEDS:   No education needs identified at this time  RHerminie LDN Inpatient Clinical Dietitian Pager: [phone] Hours Pager: [phone]

## 2015-07-27 NOTE — Progress Notes (Signed)
[facility] PROGRESS NOTE  Melissa [name] ZOX:[id number] DOB: [birth year] DOA: 07/24/2015 PCP: [name], MD  Assessment/Plan:  Principal Problem:   Partial seizures improved since dilantin started Active Problems:   Abnormal brain imaging: Dr. Roda Shutters has reviewed films and not convinced that is definite acute infarct as it may represent laminar necrosis or mineral deposit.   Urinary tract infection:  Culture negative. Will contine 5 days abx   Hypothyroid:  TSH in April borderline low. Repeat pending Systolic dysfunction: new. No evidence of CHF.  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 Family reports that she has PEG tube for poor oral intake but is able to swallow.   Left lower lobe pneumonia v atelectasis   Hemiplegia affecting right dominant side   Multiple myeloma Hypokalemia: correct  Code Status:  DO NOT RESUSCITATE Family Communication:  Daughters at bedside Disposition Plan:  Home when seizures controlled  Consultants:  Neurology  HPI/Subjective: Seizure activity not completely resolved, much improved  Objective: Filed Vitals:   07/27/15 1043  BP: 153/62  Pulse: 73  Temp: 99.5 F (37.5 C)  Resp: 20    Intake/Output Summary (Last 24 hours) at 07/27/15 1335 Last data filed at 07/26/15 2018  Gross per 24 hour  Intake    100 ml  Output      0 ml  Net    100 ml   Filed Weights   07/25/15 1700 07/26/15 0500 07/27/15 0400  Weight: 88.542 kg (195 lb 3.2 oz) 86.274 kg (190 lb 3.2 oz) 89.268 kg (196 lb 12.8 oz)    Exam:   General:  Getting EEG  Cardiovascular: Regular rate rhythm without murmurs gallops rubs  Respiratory: Clear to auscultation bilaterally without wheezes rhonchi or rales  Abdomen: Soft nontender nondistended  Ext: No edema. Right foot with foot  drop boot.  Neurologic: Right hemiparesis. No seizure activity noted  Basic Metabolic Panel:  Recent Labs Lab 07/24/15 1506 07/25/15 0545 07/27/15 1110  NA 140 137 138  K 3.7 3.6 3.4*  CL 105 103 105  CO2 29 28 29   GLUCOSE 83 82 133*  BUN 10 12 17   CREATININE 0.69 0.86 0.77  CALCIUM 9.7 9.5 9.2   Liver Function Tests:  Recent Labs Lab 07/25/15 0545  AST 18  ALT 20  ALKPHOS 40  BILITOT 0.6  PROT 7.3  ALBUMIN 2.8*   No results for input(s): LIPASE, AMYLASE in the last 168 hours. No results for input(s): AMMONIA in the last 168 hours. CBC:  Recent Labs Lab 07/24/15 1506 07/27/15 1110  WBC 7.2 6.9  HGB 12.7 11.0*  HCT 36.3 31.7*  MCV 87.3 88.1  PLT 271 271   Cardiac Enzymes: No results for input(s): CKTOTAL, CKMB, CKMBINDEX, TROPONINI in the last 168 hours. BNP (last 3 results) No results for input(s): BNP in the last 8760 hours.  ProBNP (last 3 results)  Recent Labs  09/19/14 1004  PROBNP 1843.0*    CBG:  Recent Labs Lab 07/26/15 1948 07/26/15 2358 07/27/15 0357 07/27/15 0803 07/27/15 1130  GLUCAP 133* 86 97 170* 148*    Recent Results (from the past 240 hour(s))  Culture, Urine     Status: None   Collection Time: 07/24/15  2:15 PM  Result Value Ref Range Status   Specimen Description URINE,  RANDOM  Final   Special Requests CX ADDED AT 0215 ON [id number]  Final   Culture NO GROWTH 1 DAY  Final   Report Status 2015-08-14 FINAL  Final  Culture, blood (routine x 2)     Status: None (Preliminary result)   Collection Time: 07/24/15  5:20 PM  Result Value Ref Range Status   Specimen Description BLOOD RIGHT ARM  Final   Special Requests BOTTLES DRAWN AEROBIC AND ANAEROBIC 10CC  Final   Culture NO GROWTH 2 DAYS  Final   Report Status PENDING  Incomplete  Culture, blood (routine x 2)     Status: None (Preliminary result)   Collection Time: 07/24/15  5:25 PM  Result Value Ref Range Status   Specimen Description BLOOD RIGHT HAND  Final   Special  Requests BOTTLES DRAWN AEROBIC ONLY 10CC  Final   Culture NO GROWTH 2 DAYS  Final   Report Status PENDING  Incomplete     Studies: Dg Elbow 2 Views Right  2015-08-14   CLINICAL DATA:  Right elbow pain, limited range of motion, no further history is available  EXAM: RIGHT ELBOW - 2 VIEW  COMPARISON:  Elbow series of Apr 25, 2015  FINDINGS: The bones are osteopenic. The radial head is intact. The olecranon is grossly intact. The condylar and supracondylar regions of the distal humerus are intact where visualized. There is soft tissue calcification along the posterior aspect of the joint. There is a posterior fat pad sign consistent with an effusion. There is a tiny olecranon spur.  IMPRESSION: No definite acute fracture or dislocation is observed. There is a joint effusion. Degenerative changes associated with the posterior aspect of the elbow joint are present. There has not been significant interval change since the previous study.   Electronically Signed   By: David  [place] M.D.   On: 08/14/2015 16:07    Scheduled Meds: . aspirin  162 mg Per Tube Daily  . carvedilol  6.25 mg Per Tube BID WC  . cefTRIAXone (ROCEPHIN)  IV  1 g Intravenous Q24H  . cloNIDine  0.3 mg Transdermal Weekly  . DULoxetine  30 mg Oral BID  . enoxaparin (LOVENOX) injection  40 mg Subcutaneous Q24H  . feeding supplement (OSMOLITE 1.5 CAL)  237 mL Per Tube 5 X Daily  . feeding supplement (PRO-STAT SUGAR FREE 64)  30 mL Per Tube BID  . free water  100 mL Per Tube 6 times per day  . lacosamide (VIMPAT) IV  200 mg Intravenous Once  . lacosamide  100 mg Oral BID  . levETIRAcetam  1,000 mg Per Tube BID  . levothyroxine  75 mcg Per Tube QAC breakfast  . lisinopril  5 mg Oral Daily  . phenytoin  100 mg Oral TID  . pravastatin  40 mg Oral q1800  . sodium chloride  3 mL Intravenous Q12H  . sorbitol  30 mL Per Tube Daily   Continuous Infusions:   Time spent: 25 minutes  Dyamon Sosinski L  [facility] [email or web address],  password [facility] 07/27/2015, 1:35 PM  LOS: 3 days

## 2015-07-27 NOTE — Consult Note (Signed)
ELECTROPHYSIOLOGY CONSULT NOTE  Patient ID: Melissa [name] MRN: [id number], DOB/AGE: August 21, 1946   Admit date: 07/24/2015 Date of Consult: 07/27/2015  Primary Physician: [name], MD Primary Cardiologist: new to [facility] Reason for Consultation: Cryptogenic stroke; recommendations regarding Implantable Loop Recorder  History of Present Illness Melissa [name] was admitted on 07/24/2015 with possible seizure.  Imaging demonstrated chronic left posterior temporal/parietal infarcts with laminar necrosis felt to be embolic.  She was admitted several months ago with stroke and TEE was done at that time which demonstrated PFO.  Venous dopplers were negative. ILR implant was recommended at that time, however, a more palliative approach was ultimately decided on.  Her family have reconsidered and would consider anticoagulation and would like to pursue ILR implant.   She is bedbound and not communicative today.  Her daughters provide history.   EP has been asked to evaluate for placement of an implantable loop recorder to monitor for atrial fibrillation.  ROS is negative except as outlined above.    Past Medical History  Diagnosis Date  . Vertigo, intermittent   . Hypothyroidism   . Hyperlipidemia   . Obesity   . Hypertension     severe and resistant to treatment; 2008-negative left renal angiogram  . Vitiligo   . Degenerative disc disease, lumbar   . Degenerative disc disease, cervical   . Chest pain 2008    2008-normal coronary angiography  . 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 Renal cell carcinoma     Right nephrectomy  . Multiple myeloma 08/26/2014     Surgical History:  Past Surgical History  Procedure Laterality Date  . Tubal ligation  1983    Bilateral  . Nephrectomy      Right; secondary to renal cell carcinoma  . Colonoscopy N/A 04/21/2014    Procedure: COLONOSCOPY;  Surgeon: Malissa Hippo, MD;  Location: [facility];  Service: Endoscopy;   Laterality: N/A;  . Colonoscopy N/A 04/21/2014    Procedure: COLONOSCOPY;  Surgeon: Malissa Hippo, MD;  Location: [facility];  Service: Endoscopy;  Laterality: N/A;  1030  . Tee without cardioversion N/A 09/23/2014    Procedure: TRANSESOPHAGEAL ECHOCARDIOGRAM (TEE);  Surgeon: Thurmon Fair, MD;  Location: [facility] ENDOSCOPY;  Service: Cardiovascular;  Laterality: N/A;     Prescriptions prior to admission  Medication Sig Dispense Refill Last Dose  . albuterol (PROVENTIL) (2.5 MG/3ML) 0.083% nebulizer solution Take 2.5 mg by nebulization every 6 (six) hours as needed for wheezing or shortness of breath.   over 30 days  . Amino Acids-Protein Hydrolys (FEEDING SUPPLEMENT, PRO-STAT SUGAR FREE 64,) LIQD Take 30 mLs by mouth daily. (Patient taking differently: Take 30 mLs by mouth 2 (two) times daily. ) 900 mL 0 07/24/2015 at Unknown time  . amLODipine (NORVASC) 10 MG tablet Place 1 tablet (10 mg total) into feeding tube every morning.   07/24/2015 at Unknown time  . aspirin 81 MG chewable tablet 162 mg by Feeding Tube route daily.    07/24/2015 at Unknown time  . budesonide-formoterol (SYMBICORT) 160-4.5 MCG/ACT inhaler Inhale 2 puffs into the lungs at bedtime.    07/23/2015 at Unknown time  . carvedilol (COREG) 6.25 MG tablet TAKE ONE TABLET INTO FEEDING TUBE TWICE DAILY WITH MEALS 60 tablet 3 07/24/2015 at 800  . Cholecalciferol (VITAMIN D3) 50000 UNITS TABS Take 1 tablet by mouth once a week. By feeding tube (Patient taking differently: 1 tablet by Feeding Tube route once a week. )  4 tablet 5 Past Week at Unknown time  . clonazePAM (KLONOPIN) 0.5 MG tablet One tablet three times daily, every 8 hours for anxiety andf agitation (Patient taking differently: 0.5 mg by Feeding Tube route 3 (three) times daily. ) 90 tablet 3 07/24/2015 at Unknown time  . cloNIDine (CATAPRES - DOSED IN MG/24 HR) 0.3 mg/24hr patch APPLY 1 PATCH TOPICALLY ONCE A WEEK 4 patch 2 07/24/2015 at Unknown time  . DULoxetine (CYMBALTA) 30 MG capsule  30 mg by Feeding Tube route 2 (two) times daily.    07/24/2015 at Unknown time  . gabapentin (NEURONTIN) 300 MG capsule TAKE ONE CAPSULE BY MOUTH TWICE DAILY (Patient taking differently: TAKE ONE CAPSULE BY FEEDING TUBE TWICE DAILY) 60 capsule 2 07/24/2015 at Unknown time  . HYDROcodone-acetaminophen (NORCO/VICODIN) 5-325 MG per tablet Take 1 tablet by mouth 2 (two) times daily as needed for moderate pain. 60 tablet 0 07/18/2015  . Ketamine HCl POWD Apply 1 application topically 2 (two) times daily.    07/24/2015 at Unknown time  . levothyroxine (SYNTHROID, LEVOTHROID) 75 MCG tablet One tablet mon-fri and one half tab on sat and sun (Patient taking differently: Take 37.5-75 mcg by mouth daily before breakfast. One tablet mon-fri and one half tab on sat and sun) 26 tablet 5 07/24/2015 at Unknown time  . lovastatin (MEVACOR) 40 MG tablet Place 1 tablet (40 mg total) into feeding tube at bedtime.   07/23/2015 at Unknown time  . meclizine (ANTIVERT) 25 MG tablet Place 25 mg into feeding tube 3 (three) times daily as needed for dizziness.    07/23/2015 at Unknown time  . Multiple Vitamins-Minerals (MULTIVITAMIN WITH MINERALS) tablet Take 1 tablet by mouth daily.   07/24/2015 at Unknown time  . pantoprazole (PROTONIX) 40 MG tablet 40 mg by Feeding Tube route 2 (two) times daily.   07/24/2015 at Unknown time  . polyethylene glycol (MIRALAX / GLYCOLAX) packet Take 17 g by mouth daily as needed for mild constipation.    over 30 days  . Water For Irrigation, Sterile (FREE WATER) SOLN Place 100 mLs into feeding tube every 4 (four) hours.   unknown  . lovastatin (MEVACOR) 40 MG tablet TAKE ONE TABLET BY MOUTH ONCE DAILY WITH BREAKFAST (Patient not taking: Reported on 07/24/2015) 30 tablet 2 Not Taking at Unknown time  . prochlorperazine (COMPAZINE) 10 MG tablet 10 mg by Feeding Tube route every 6 (six) hours as needed for nausea or vomiting.    Not Taking at Unknown time    Inpatient Medications:  . aspirin  162 mg Per Tube  Daily  . carvedilol  6.25 mg Per Tube BID WC  . cefTRIAXone (ROCEPHIN)  IV  1 g Intravenous Q24H  . cloNIDine  0.3 mg Transdermal Weekly  . DULoxetine  30 mg Oral BID  . enoxaparin (LOVENOX) injection  40 mg Subcutaneous Q24H  . feeding supplement (OSMOLITE 1.5 CAL)  237 mL Per Tube 5 X Daily  . feeding supplement (PRO-STAT SUGAR FREE 64)  30 mL Per Tube BID  . free water  100 mL Per Tube 6 times per day  . lacosamide  100 mg Oral BID  . levETIRAcetam  1,000 mg Per Tube BID  . levothyroxine  75 mcg Per Tube QAC breakfast  . lisinopril  5 mg Oral Daily  . phenytoin  100 mg Oral TID  . potassium chloride  40 mEq Per Tube Once  . pravastatin  40 mg Oral q1800  . sodium chloride  3 mL  Intravenous Q12H  . sorbitol  30 mL Per Tube Daily    Allergies:  Allergies  Allergen Reactions  . Morphine Nausea And Vomiting    History   Social History  . Marital Status: Divorced    Spouse Name: N/A  . Number of Children: 2  . Years of Education: N/A   Occupational History  . Disabled    Social History Main Topics  . Smoking status: Never Smoker   . Smokeless tobacco: Never Used  . Alcohol Use: No  . Drug Use: No  . Sexual Activity: No   Other Topics Concern  . Not on file   Social History Narrative   Three grandchildren.   Patient is left handed.   Patient is disabled.        Family History  Problem Relation Age of Onset  . Heart disease Mother 5  . Hypertension Mother   . Colon cancer Father   . Hypertension Sister     x2  . Diabetes Sister     x2  . Heart failure Sister   . Lupus Brother      Physical Exam: Filed Vitals:   07/27/15 0201 07/27/15 0400 07/27/15 0540 07/27/15 1043  BP: 140/56  150/60 153/62  Pulse: 72  75 73  Temp: 99.1 F (37.3 C)  99.5 F (37.5 C) 99.5 F (37.5 C)  TempSrc: Axillary  Axillary Oral  Resp: 18  18 20   Height:      Weight:  196 lb 12.8 oz (89.268 kg)    SpO2: 100%  99% 100%    GEN- The patient is very ill appearing,  alert but not communicative,  Head- normocephalic, atraumatic Eyes-  Sclera clear, conjunctiva pink Ears- hearing intact Oropharynx- clear Neck- supple, Lungs- Clear to ausculation bilaterally, normal work of breathing Heart- Regular rate and rhythm  GI- soft, NT, ND, + BS Extremities- no clubbing, cyanosis, + dependant edema MS- R side flacid Skin- no rash or lesion Psych- unable to assess, non interactive   Labs:   Lab Results  Component Value Date   WBC 6.9 07/27/2015   HGB 11.0* 07/27/2015   HCT 31.7* 07/27/2015   MCV 88.1 07/27/2015   PLT 271 07/27/2015    Recent Labs Lab 07/25/15 0545 07/27/15 1110  NA 137 138  K 3.6 3.4*  CL 103 105  CO2 28 29  BUN 12 17  CREATININE 0.86 0.77  CALCIUM 9.5 9.2  PROT 7.3  --   BILITOT 0.6  --   ALKPHOS 40  --   ALT 20  --   AST 18  --   GLUCOSE 82 133*     Radiology/Studies: Dg Chest 1 View 07/24/2015   CLINICAL DATA:  Muscle twitching.  EXAM: CHEST  1 VIEW  COMPARISON:  09/28/2014.  FINDINGS: Stable enlarged cardiac silhouette. Left lower lobe opacity. Mild linear density at the right lung base. Thoracic spine degenerative changes.  IMPRESSION: 1. Left lower lobe atelectasis or pneumonia. 2. Mild right basilar atelectasis. 3. Stable cardiomegaly.   Electronically Signed   By: Beckie Salts M.D.   On: 07/24/2015 13:50   12-lead ECG  All prior EKG's in EPIC reviewed with no documented atrial fibrillation  Telemetry sinus rhythm with PVC's  Assessment and Plan:  1. Cryptogenic stroke The patient has a history of cryptogenic stroke and has worn an event monitor with results not currently available.  I think that this patient is a poor candidate for implantable loop recorder placement.  I would advise that the family follow-up with neurology on results of 30 monitor (placed in [place] by ??PCP).   Palliative measures remain appropriate.  I would not advise ILR.  Electrophysiology team to see as needed while here. Please call  with questions.   Hillis Range MD, [facility] 07/27/2015 3:58 PM

## 2015-07-28 LAB — GLUCOSE, CAPILLARY
GLUCOSE-CAPILLARY: 88 mg/dL (ref 65–99)
GLUCOSE-CAPILLARY: 130 mg/dL — AB (ref 65–99)
GLUCOSE-CAPILLARY: 162 mg/dL — AB (ref 65–99)
Glucose-Capillary: 98 mg/dL (ref 65–99)
Glucose-Capillary: 139 mg/dL — ABNORMAL HIGH (ref 65–99)

## 2015-07-28 LAB — CBC
HCT: 32.7 % — ABNORMAL LOW (ref 36.0–46.0)
Hemoglobin: 11.3 g/dL — ABNORMAL LOW (ref 12.0–15.0)
MCH: 30.1 pg (ref 26.0–34.0)
MCHC: 34.6 g/dL (ref 30.0–36.0)
MCV: 87 fL (ref 78.0–100.0)
PLATELETS: 288 10*3/uL (ref 150–400)
RBC: 3.76 MIL/uL — ABNORMAL LOW (ref 3.87–5.11)
RDW: 17.4 % — AB (ref 11.5–15.5)
WBC: 5.7 10*3/uL (ref 4.0–10.5)

## 2015-07-28 LAB — BASIC METABOLIC PANEL
Anion gap: 8 (ref 5–15)
BUN: 13 mg/dL (ref 6–20)
CO2: 27 mmol/L (ref 22–32)
CREATININE: 0.67 mg/dL (ref 0.44–1.00)
Calcium: 9.3 mg/dL (ref 8.9–10.3)
Chloride: 102 mmol/L (ref 101–111)
GFR calc Af Amer: >60 mL/min (ref >60)
GLUCOSE: 97 mg/dL (ref 65–99)
Potassium: 3.9 mmol/L (ref 3.5–5.1)
Sodium: 137 mmol/L (ref 135–145)

## 2015-07-28 LAB — PHENYTOIN LEVEL, TOTAL: Phenytoin Lvl: 18.5 ug/mL (ref 10.0–20.0)

## 2015-07-28 MED ORDER — CLOPIDOGREL BISULFATE 75 MG PO TABS
75.0000 mg | ORAL_TABLET | Freq: Every day | ORAL | Status: DC
  Administered 2015-07-29 – 2015-08-03 (×6): 75 mg via ORAL
  Filled 2015-07-28 (×6): qty 1

## 2015-07-28 MED ORDER — CLOPIDOGREL 45 MG/30 ML ORAL SUSPENSION: 75.0000 mg | Freq: Every day | ORAL | Status: DC

## 2015-07-28 MED ORDER — LEVETIRACETAM 100 MG/ML PO SOLN
1500.0000 mg | Freq: Two times a day (BID) | ORAL | Status: DC
  Administered 2015-07-28 – 2015-07-30 (×4): 1500 mg via ORAL
  Filled 2015-07-28 (×5): qty 15

## 2015-07-28 MED ORDER — CEFUROXIME AXETIL 500 MG PO TABS
500.0000 mg | ORAL_TABLET | Freq: Two times a day (BID) | ORAL | Status: AC
  Administered 2015-07-29 (×2): 500 mg via ORAL
  Filled 2015-07-28 (×2): qty 1

## 2015-07-28 NOTE — Progress Notes (Signed)
Pt examined. Seizure activity improved. Not a candidate for loop recorder.  Home when ok with neuro. Will discuss.  Doree Barthel, MD

## 2015-07-28 NOTE — Progress Notes (Signed)
[facility] PROGRESS NOTE  Melissa [name] ZOX:[id number] DOB: 09-11-1946 DOA: 07/24/2015 PCP: [name], MD  Summary 69 y.o. 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 She presents to the emergency department with possible left-sided seizure. In ED she was found to have UTI and was admitted for treatment of UTI and ongoing jerking movements of the left arm and leg. MRI brain abnormal. (see below)  Assessment/Plan:  Principal Problem:   Partial seizures. Neuro has added dilantin  And vimpat. Improving.  D/w Dr. Roda Shutters. Pt still on iv keppra. He rec changing to 1500 pgt.  PT/OT rec SNF. Family agreeable.  Active Problems:    Abnormal brain imaging: Dr. Roda Shutters has reviewed films and not convinced that is definite acute infarct as it may represent laminar necrosis or mineral deposit.  Possibly watershed infarct v embolic.  MRA in 08/2014 showed right M1 cut off.   Carotid Doppler No significant stenosis   2D Echo EF 35-40% down from 08/2014 60-65%  TEE 09/2014 EF 65-70%, no SOE but large PFO  Venous doppler - No evidence of DVT or superficial thrombosis.  EP consulted and feel patient not a good candidate for loop recorder  LDL 72  HgbA1c 5.8 in April  D/w dr. Roda Shutters. He rec changing ASA to plavix. Will order Ongoing aggressive stroke risk factor management    Urinary tract infection:  Culture negative. Will contine 5 days abx total. Change to PGT    Hypothyroid:  TSH in April borderline low. Repeat reordered  Systolic dysfunction: new. No evidence of CHF.  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 Family reports that she has PEG tube  for poor oral intake but is able to swallow.   Multiple myeloma  Code Status:  DO NOT RESUSCITATE Family Communication:  Daughters at bedside Disposition Plan:  SNF 1-2 days if stable  Consultants:  Neurology  Procedures:     Antibiotics:    HPI/Subjective: Unable. Per daughters, has had less left leg jerking  Objective: Filed Vitals:   07/28/15 1025  BP: 173/81  Pulse: 78  Temp: 98.1 F (36.7 C)  Resp: 20    Intake/Output Summary (Last 24 hours) at 07/28/15 1614 Last data filed at 07/28/15 0500  Gross per 24 hour  Intake    200 ml  Output      0 ml  Net    200 ml   Filed Weights   07/26/15 0500 07/27/15 0400 07/28/15 0414  Weight: 86.274 kg (190 lb 3.2 oz) 89.268 kg (196 lb 12.8 oz) 90.311 kg (199 lb 1.6 oz)    Exam:   General:  Nonverbal. Will track.   Cardiovascular: Regular rate rhythm without murmurs gallops rubs  Respiratory: Clear to auscultation bilaterally without wheezes rhonchi or rales  Abdomen: Soft nontender nondistended  Ext: No edema. Right foot with foot drop boot.  Neurologic: Right hemiparesis. No seizure activity noted  Basic Metabolic Panel:  Recent Labs Lab 07/24/15 1506 07/25/15 0545 07/27/15 1110 07/28/15 0550  NA 140 137 138 137  K 3.7 3.6 3.4* 3.9  CL 105 103 105 102  CO2 29 28 29  27  GLUCOSE 83 82 133* 97  BUN 10 12 17 13   CREATININE 0.69 0.86 0.77 0.67  CALCIUM 9.7 9.5 9.2 9.3   Liver Function Tests:  Recent Labs Lab 07/25/15 0545  AST 18  ALT 20  ALKPHOS 40  BILITOT 0.6  PROT 7.3  ALBUMIN 2.8*   No results for input(s): LIPASE, AMYLASE in the last 168 hours. No results for input(s): AMMONIA in the last 168 hours. CBC:  Recent Labs Lab 07/24/15 1506 07/27/15 1110 07/28/15 0550  WBC 7.2 6.9 5.7  HGB 12.7 11.0* 11.3*  HCT 36.3 31.7* 32.7*  MCV 87.3 88.1 87.0  PLT 271 271 288   Cardiac Enzymes: No results for input(s): CKTOTAL, CKMB, CKMBINDEX, TROPONINI in the last 168 hours. BNP (last 3  results) No results for input(s): BNP in the last 8760 hours.  ProBNP (last 3 results)  Recent Labs  09/19/14 1004  PROBNP 1843.0*    CBG:  Recent Labs Lab 07/27/15 2047 07/27/15 2355 07/28/15 0356 07/28/15 0800 07/28/15 1120  GLUCAP 114* 173* 88 98 130*    Recent Results (from the past 240 hour(s))  Culture, Urine     Status: None   Collection Time: 07/24/15  2:15 PM  Result Value Ref Range Status   Specimen Description URINE, RANDOM  Final   Special Requests CX ADDED AT 0215 ON [id number]  Final   Culture NO GROWTH 1 DAY  Final   Report Status 07/26/2015 FINAL  Final  Culture, blood (routine x 2)     Status: None (Preliminary result)   Collection Time: 07/24/15  5:20 PM  Result Value Ref Range Status   Specimen Description BLOOD RIGHT ARM  Final   Special Requests BOTTLES DRAWN AEROBIC AND ANAEROBIC 10CC  Final   Culture NO GROWTH 4 DAYS  Final   Report Status PENDING  Incomplete  Culture, blood (routine x 2)     Status: None (Preliminary result)   Collection Time: 07/24/15  5:25 PM  Result Value Ref Range Status   Specimen Description BLOOD RIGHT HAND  Final   Special Requests BOTTLES DRAWN AEROBIC ONLY 10CC  Final   Culture NO GROWTH 4 DAYS  Final   Report Status PENDING  Incomplete     Studies: No results found.  Scheduled Meds: . aspirin  162 mg Per Tube Daily  . carvedilol  6.25 mg Per Tube BID WC  . cefTRIAXone (ROCEPHIN)  IV  1 g Intravenous Q24H  . cloNIDine  0.3 mg Transdermal Weekly  . DULoxetine  30 mg Oral BID  . enoxaparin (LOVENOX) injection  40 mg Subcutaneous Q24H  . feeding supplement (OSMOLITE 1.5 CAL)  237 mL Per Tube 5 X Daily  . feeding supplement (PRO-STAT SUGAR FREE 64)  30 mL Per Tube BID  . free water  100 mL Per Tube 6 times per day  . lacosamide  100 mg Oral BID  . levETIRAcetam  1,500 mg Oral BID  . levothyroxine  75 mcg Per Tube QAC breakfast  . lisinopril  5 mg Oral Daily  . phenytoin  100 mg Oral TID  . pravastatin  40 mg  Oral q1800  . sodium chloride  3 mL Intravenous Q12H  . sorbitol  30 mL Per Tube Daily   Continuous Infusions:   Time spent: 25 minutes  Melissa [name] L  [facility] [email or web address], password [facility] 07/28/2015, 4:14 PM  LOS: 4 days

## 2015-07-28 NOTE — Progress Notes (Signed)
Pt has decided to discharge to SNF, Made Miranda with [facility] aware as well as CSW.

## 2015-07-28 NOTE — Progress Notes (Signed)
SLP Cancellation Note  Patient Details Name: Melissa [name] MRN: [id number] DOB: Mar 19, 1946   Cancelled treatment:       Reason Eval/Treat Not Completed: Other (comment) (PT/OT getting ready to see pt- will rettempt at another time)   Claudie Fisherman, [facility] SLP [phone]

## 2015-07-28 NOTE — Progress Notes (Signed)
CM met with patient's daughter to discuss discharge planning.  Family is still leaning towards discharging home with home health, but daughter questioned whether patient had to discharge to SNF because it was recommended by PT today.  CM explained the purpose of PT recommendations and assured daughter that it is still their choice.  Daughters have been patient's caretakers prior to admission.  CM requested that daughters discuss and let CM know their final decision as soon as possible.  CM updated CSW of potential disposition change. Daughter feels that home health will remain their disposition, but would like to discuss further.  Miranda with [facility] was notified of probable referral, per daughter's request.  No orders have been placed at this time.

## 2015-07-28 NOTE — Progress Notes (Signed)
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 Pt was seen in 2015, arousal was a barrier to swallowing. PEG was placed. MRI shows old infarction in the left MCA territory affecting the posterior temporal lobe and parietal region having progressed to atrophy, encephalomalacia and gliosis. Areas of acute infarction representing extension of infarction in that region. 2 punctate foci of acute infarction separate from that area in the left frontal lobe.     Clinical Impression   PTA, pt was Max A for ADL and family assisted with bed - chair transfer. Per daughter, pt assisted with transfers and was interactive with family. Pt with decline in function, requiring Max A +2 for mobility and total A for ADL. Pt will benefit from rehab at SNF to return to PLOF and facilitate safe D/C home with 24/7 care of family. Family i agreement with SNF. Nsg to use maximove for transfers.    Follow Up Recommendations  SNF;Supervision/Assistance - 24 hour    Equipment Recommendations  None recommended by OT    Recommendations for Other Services       Precautions / Restrictions Precautions Precautions: Fall (peg tube in place, ) Precaution Comments: poor ability to control R ankle Restrictions Weight Bearing Restrictions: No      Mobility Bed Mobility Overal bed mobility: Needs Assistance;+2 for physical assistance Bed Mobility: Rolling;Sidelying to Sit Rolling: Max assist;+2 for physical assistance Sidelying to sit: Total assist;+2 for physical assistance;HOB elevated       General bed mobility comments: Max assist for rolling and bed mobility. patient was able to use LUE to grip  railing when rolling to the right. Assist for LEs positioning, increased physical assist for trunk elevation to upright (total assist)  Transfers Overall transfer level: Needs assistance   Transfers: Sit to/from Stand;Stand Pivot Transfers Sit to Stand: Max assist;+2 physical assistance Stand pivot transfers: Max assist;+2 physical assistance       General transfer comment: bed sheet, chuck pad and gait belt useed with face to face and bilateral LE blocking. patient was able to take weight through her LEs during transfer    Balance Overall balance assessment: Needs assistance Sitting-balance support: Feet supported Sitting balance-Leahy Scale: Poor Sitting balance - Comments: patient initially requiring min to moderate assist to maintain balance with posterior/ anterior sway. After a few minutes patient was able to maintain midline and sit with min guard. Postural control: Right lateral lean                                  ADL Overall ADL's : Needs assistance/impaired                                       General ADL Comments: Pt total A with ADL at baseline but able to wash her face. Pt is not completing that task now and less interactive     Vision Additional Comments: visionimpaired   Perception     Praxis Praxis Praxis tested?: Deficits Deficits: Initiation;Ideomotor (aparaxic at baseline)    Pertinent Vitals/Pain Pain Assessment: Faces Faces Pain Scale: Hurts little more Pain  Location: RUE during movement Pain Descriptors / Indicators: Grimacing;Moaning Pain Intervention(s): Limited activity within patient's tolerance;Repositioned;Monitored during session     Hand Dominance Right   Extremity/Trunk Assessment Upper Extremity Assessment Upper Extremity Assessment: RUE deficits/detail RUE Deficits / Details: hemiparesis at baseline   Lower Extremity Assessment Lower Extremity Assessment: Defer to PT evaluation   Cervical / Trunk  Assessment Cervical / Trunk Assessment: Kyphotic;Other exceptions (increased body habitus) Cervical / Trunk Exceptions: R trunk shortenting   Communication Communication Communication: Receptive difficulties;Expressive difficulties   Cognition Arousal/Alertness: Awake/alert Behavior During Therapy: Flat affect Overall Cognitive Status: Difficult to assess                     General Comments       Exercises       Shoulder Instructions      Home Living Family/patient expects to be discharged to:: Skilled nursing facility                                 Additional Comments: Family extremely supportive,       Prior Functioning/Environment Level of Independence: Needs assistance  Gait / Transfers Assistance Needed: maximal assist for sit to stand, stand pivot to chair max assist via caregiver ADL's / Homemaking Assistance Needed: patient able to perform some self feeding with assist and setup Communication / Swallowing Assistance Needed: ocassional verbalizations, facial expresions at baseline, No fluent conversation (expressive deficits) Comments: likes to listen to music, family assist for transfer OOB to chair multiple times daily. Patient sits in rocking chair for 2-3 hours then transfers back to bed (total care for hygiene) per daughter patient usually able to sit EOB for 3-5 minutes before standing with 1 person max assist and transferring to chair    OT Diagnosis: Generalized weakness;Cognitive deficits;Disturbance of vision;Hemiplegia dominant side;Apraxia   OT Problem List: Decreased strength;Decreased range of motion;Decreased activity tolerance;Impaired balance (sitting and/or standing);Impaired vision/perception;Decreased coordination;Decreased cognition;Decreased safety awareness;Decreased knowledge of use of DME or AE;Cardiopulmonary status limiting activity;Impaired sensation;Impaired tone;Obesity;Impaired UE functional use;Pain;Increased edema    OT Treatment/Interventions: Self-care/ADL training;Therapeutic exercise;Neuromuscular education;DME and/or AE instruction;Therapeutic activities;Cognitive remediation/compensation;Visual/perceptual remediation/compensation;Patient/family education;Balance training    OT Goals(Current goals can be found in the care plan section) Acute Rehab OT Goals Patient Stated Goal: per daughter -to get back to single person level of care OT Goal Formulation: With family Time For Goal Achievement: 08/11/15 Potential to Achieve Goals: Fair  OT Frequency: Min 2X/week   Barriers to D/C:            Co-evaluation              End of Session Nurse Communication: Mobility status;Need for lift equipment (maximove)  Activity Tolerance: Patient tolerated treatment well Patient left: in chair;with call bell/phone within reach;with family/visitor present   Time: 1610-9604 OT Time Calculation (min): 41 min Charges:  OT General Charges $OT Visit: 1 Procedure OT Evaluation $Initial OT Evaluation Tier I: 1 Procedure OT Treatments $Self Care/Home Management : 8-22 mins G-Codes:    Zhamir Pirro,HILLARY 08/10/2015, 2:23 PM   [facility], OTR/L  [phone] 08/10/2015

## 2015-07-28 NOTE — Progress Notes (Signed)
STROKE TEAM PROGRESS NOTE   HISTORY Melissa [name] is an 69 y.o. 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 She presents to the emergency department with possible left-sided seizure. In ED she was found to have UTI and was admitted for treatment of UTI and ongoing jerking movements of the left arm and leg. patient received Ativan once while in ED which seemed to stop the movements per family. She was also loaded with 1000 mg Keppra and currently on 500 mg BID. The left arm and leg continues to have non-rhythmic jerking which could be myoclonus versus seizure. Neurology was asked to evaluate.   Patient was not considered for TPA secondary to non-stroke presentation, stroke not recognized.    SUBJECTIVE (INTERVAL HISTORY) Multiple family members present. The patient appears more alert today, tracking people but still makes no attempt to communicate. Dr. Erlinda Hong informed them of the cardiology recommendations to not proceed with loop placement at this time. The patient continues to have subtle seizure activity of the left lower extremity.  OBJECTIVE Temp:  [97.8 F (36.6 C)-99.1 F (37.3 C)] 98.1 F (36.7 C) (08/05 1025) Pulse Rate:  [77-84] 78 (08/05 1025) Cardiac Rhythm:  [-] Normal sinus rhythm (08/04 1958) Resp:  [20] 20 (08/05 1025) BP: (150-173)/(60-81) 173/81 mmHg (08/05 1025) SpO2:  [98 %-100 %] 99 % (08/05 1025) Weight:  [90.311 kg (199 lb 1.6 oz)] 90.311 kg (199 lb 1.6 oz) (08/05 0414)   Recent Labs Lab 07/27/15 2047 07/27/15 2355 07/28/15 0356 07/28/15 0800 07/28/15 1120  GLUCAP 114* 173* 88 98 130*    Recent Labs Lab 07/24/15 1506 07/25/15 0545 07/27/15 1110 07/28/15 0550  NA 140 137 138 137  K 3.7 3.6 3.4* 3.9  CL 105 103 105 102  CO2 $Re'29 28 29 27  'mJX$ GLUCOSE 83 82 133* 97  BUN $Re'10 12 17 13  'viU$ CREATININE  0.69 0.86 0.77 0.67  CALCIUM 9.7 9.5 9.2 9.3    Recent Labs Lab 07/25/15 0545  AST 18  ALT 20  ALKPHOS 40  BILITOT 0.6  PROT 7.3  ALBUMIN 2.8*    Recent Labs Lab 07/24/15 1506 07/27/15 1110 07/28/15 0550  WBC 7.2 6.9 5.7  HGB 12.7 11.0* 11.3*  HCT 36.3 31.7* 32.7*  MCV 87.3 88.1 87.0  PLT 271 271 288   No results for input(s): CKTOTAL, CKMB, CKMBINDEX, TROPONINI in the last 168 hours. No results for input(s): LABPROT, INR in the last 72 hours. No results for input(s): COLORURINE, LABSPEC, [place], GLUCOSEU, HGBUR, BILIRUBINUR, KETONESUR, PROTEINUR, UROBILINOGEN, NITRITE, LEUKOCYTESUR in the last 72 hours.  Invalid input(s): APPERANCEUR     Component Value Date/Time   CHOL 140 04/03/2015 1122   TRIG 153* 04/03/2015 1122   HDL 37* 04/03/2015 1122   CHOLHDL 3.8 04/03/2015 1122   VLDL 31 04/03/2015 1122   LDLCALC 72 04/03/2015 1122   Lab Results  Component Value Date   HGBA1C 5.8* 04/03/2015   No results found for: LABOPIA, COCAINSCRNUR, LABBENZ, AMPHETMU, THCU, LABBARB  No results for input(s): ETH in the last 168 hours.  I have personally reviewed the radiological images below and agree with the radiology interpretations. Blue text is my interpretation.  Dg Chest 1 View 07/24/2015   1. Left lower lobe atelectasis or pneumonia. 2. Mild right basilar atelectasis. 3. Stable cardiomegaly.     Ct Head Wo  Contrast 07/24/2015    No acute intracranial abnormality. Stable bilateral old infarcts as described above. Stable mild cerebral atrophy.     Mr Brain Wo Contrast 07/24/2015   Old infarction in the left MCA territory affecting the posterior temporal lobe and parietal region having progressed to atrophy, encephalomalacia and gliosis. Areas of acute infarction representing extension of infarction in that region. 2 punctate foci of acute infarction separate from that area in the left frontal lobe.  Extensive old infarctions elsewhere throughout the brain  Discussed with Dr.  Vernard Gambles regarding the DWI findings. We are not convinced that is definite acute infarct as it may represent laminar necrosis or mineral deposit in the previous infarct region and CT earlier has shown the changes.   Ct Head Wo Contrast 09/18/2014 Large LEFT MCA territory infarct involving LEFT temporal and parietal lobes. No acute intracranial hemorrhage or midline shift. Old small high RIGHT frontal and questionable LEFT cerebellar infarcts.   Mr Brain Wo Contrast 09/19/2014 1. Large acute left MCA infarct. Cytotoxic edema and mass effect but no associated hemorrhage at this time. 2. Numerous scattered small acute infarcts in the bilateral MCA, right PCA, and bilateral PICA territories. Constellation most compatible with recent embolic event (cardiac or proximal aortic source).   Mr Jodene Nam Head Wo Contrast 09/19/2014 3. Left MCA occlusion in the M1 segment. Some reconstituted flow only in the anterior left MCA division. 4. No other major circle of Willis branch occlusion. Moderate irregularity of both ICA siphons.   Right Elbow 2 view x-ray 07/26/2015 No definite acute fracture or dislocation is observed.  There is a joint effusion.  Degenerative changes associated with the posterior aspect of the elbow joint are present.  There has not been significant interval change since the previous study.  TEE 09/23/14 - Intermittent large right to left shunt across a PFO, during quiet respiration - possible pathway for paradoxical embolism  Venous doppler LEs 09/23/14 - No evidence of deep vein or superficial thrombosis involving the right lower extremity and left lower extremity. - No evidence of Baker&'s cyst on the right or left.    Carotid Doppler  07/25/15 Bilateral: 1-39% ICA stenosis. Vertebral artery flow is antegrade on the right. The left vertebral artery flow could not be insonated due to involuntary movement of the body due to seizure type activity  2D Echocardiogram  07/25/15 - Left  ventricle: The cavity size was mildly dilated. Wallthickness was normal. Systolic function was moderately reduced.The estimated ejection fraction was in the range of 35% to 40%.Diffuse hypokinesis. Doppler parameters are consistent withabnormal left ventricular relaxation (grade 1 diastolicdysfunction). - Mitral valve: There was mild regurgitation. - Pulmonary arteries: Systolic pressure was moderately increased.PA peak pressure: 41 mm Hg (S).  EEG 07/25/15 1) Frequent left hemispheric seizures with a wide field 2) periodic lateralized epileptiform discharges associated with abnormal movements  Clinical Interpretation: This EEG is consistent with frequent seizures arising from the left hemisphere with a frontotemporal predominance.   EEG 07/26/15 1) Frequent left hemispheric seizures with a wide field 2) periodic lateralized epileptiform discharges associated with abnormal movements  Clinical Interpretation: This EEG is consistent with frequent seizures arising from the left hemisphere with a frontotemporal predominance. PLEDs can be ictal in nature, representing ongoing seizure activity.  EEG 07/27/15 - EEG Abnormalities: 1) periodic lateralized epileptiform discharges(PLEDs)  Clinical Interpretation: This EEG is most consistent with an area of irritability which could serve as a focus for seizures in the left hemisphere. There were no definite periods of  evolution signifying definite seizure on todays study. PLEDs are indeterminate and can be post-ictal or associated with an area of injury, but can be ictal in some circumstances.   Venous doppler - No evidence of DVT or superficial thrombosis at UEs and LEs.   PHYSICAL EXAM  Temp:  [97.8 F (36.6 C)-99.1 F (37.3 C)] 98.1 F (36.7 C) (08/05 1025) Pulse Rate:  [77-84] 78 (08/05 1025) Resp:  [20] 20 (08/05 1025) BP: (150-173)/(60-81) 173/81 mmHg (08/05 1025) SpO2:  [98 %-100 %] 99 % (08/05 1025) Weight:  [90.311 kg (199 lb 1.6 oz)]  90.311 kg (199 lb 1.6 oz) (08/05 0414)  General - Well nourished, well developed, awake with eyes open, tracking to people, no language output, not following commands.  Ophthalmologic - Fundi not visualized due to noncooperation.  Cardiovascular - Regular rate and rhythm  Neuro - awake, eyes open, but not able to follow any commands, no language output. PERRL, visual neglect on the right side, blinking to visual threat on the left, left-sided eye gaze, right facial droop, hemiplegic on the right. RLE withdraw to pain but LUE and LLE spontaneous movement. There are subtle muscle contraction of left foot.   ASSESSMENT/PLAN [name] is a 69 y.o. female with history of multiple myeloma, right sided hemiparesis from previous left MCA large stroke, dysphagia status post PEG tube, who is bedbound, and speaks a few words at baseline presenting with seizures. She was not considered for IV t-PA due to non-stroke presentation.   Simple partial seizure - new onset  Please make left hand and left food jerking, spreading to right foot  EEG showed left-sided seizure activity with wide field  EEG 07/26/2015 and 07/25/15 - frequent seizure from left hemisphere  EEG 07/27/2015 - PLEDs, no seizure  Try to avoid benzo and intubation   On Keppra 1000 mg twice a day. Increase to $RemoveBef'1500mg'beylugsRNJ$  bid, change to po  Vimpat added 07/27/2015 for continued LLE mild seizure activity.  Change fosphenytoin to PO dilantin  Dilantin level 17.4 -> 18.5   Stroke:  Chronic left posterior temporal/parietal infarct with laminar necrosis (doubt acute stroke, discussed with radiologist). Two punctate infarcts in L frontal areas. Infarcts felt to be embolic vs. Hypotension.   Resultant  baseline right hemiplegia  MRI  as above   MRA in 08/2014 showed right M1 cut off.   Carotid Doppler  No significant stenosis   2D Echo  EF 35-40% down from 08/2014 60-65%  TEE 09/2014 EF 65-70%, no SOE but large PFO  Venous  doppler - No evidence of DVT or superficial thrombosis.  Family would like aggressive work up but EP consulted dose not think she is a good candidate for loop recorder. Also pt at this time is not a good candidate for anticoagulation.   LDL 72  HgbA1c 5.8 in April  Lovenox 40 mg sq daily for VTE prophylaxis   . ST swallow eval held d/t ongoing seizures  aspirin 162 mg orally every day prior to admission, now on aspirin 162 mg orally every day  Ongoing aggressive stroke risk factor management  Therapy recommendations:  Skilled nursing facility recommended  Followed by Dr. Merlene Laughter PTA  Disposition:  The family has agreed to SNF placement per case manager's note.  Hx stroke/TIA   Large left MCA infarct in setting of L MCA M1 occlusion, and numerous scattered small acute infarcts in the bilateral MCA, right PCA, and bilateral PICA territories, Consistent with embolic most likely secondary to either  cardioembolic or hypercoagulable state from malignancy or chemo meds  TEE showed large PFO but no LE DVT  Loop recorder recommended but not put in - cardiology consult today but does not think patient is a candidate for loop placement.  At this time, pt seems not a good candidate for aggressive anticoagulation with coumadin, she will need follow up with Dr. Merlene Laughter as outpt.   UTI  Many bateria in UA but WBC 0-2  On rocephine Day # 4  Afebrile  Culture NGTD  May contribute to lowered seizure threshold   Malnutrition  Has PEG for poor oral intake, able to swallow at baseline Body mass index is 32.15 kg/(m^2).   Hypertension  Home meds:   Norvasc, coreg, clonidine  stable  Hyperlipidemia  Home meds:  mevacor 40 mg daily, resumed in hospital, though pt remains NPO awaiting seizure activity to stop prior to eval  LDL 72, goal < 70  Continue statin at discharge  Other Stroke Risk Factors  Advanced age  Obesity, Body mass index is 32.15 kg/(m^2).   Other Active  Problems  Hypothyroid  CKD stage III  dpression  COPD  CHF, pulmonary HTN  Right elbow x-ray - 07/26/2015 - effusion. No fracture.  Cardiology consult obtained - no loop placement recommended at this time.   Follow-up 30 day event monitor previously performed which was negative as per family.  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  Dr. Farrel Gobble - on Revlimid, known to have risk of PNA and stroke   Hospital day # [facility] Pager [phone] 07/28/2015, 3:34 PM  69 yo F with hx of multiple stroke, bedbound at home was admitted for seizure activity. EEG confirmed seizure from left hemisphere, but pt presented with bilateral shaking jerking and L>R. Put on dilantin, keppra, and vimpat. Seizure under control. MRI showed 2 punctate left MCA infarcts, concerning for embolic vs. Hypotension as pt has left M1 cut off. However, pt does not appear to be candidate for loop or anticoagulation due to poor functional status and mRS = 4. Will continue medical management to stabilize pt and pt will follow up with Dr. Merlene Laughter.   Melissa Hawking, MD PhD Stroke Neurology 07/29/2015 12:42 AM   To contact Stroke Continuity provider, please refer to [email or web address] After hours, contact General Neurology

## 2015-07-28 NOTE — Evaluation (Signed)
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 Pt was seen in 2015, arousal was a barrier to swallowing. PEG was placed. MRI shows old infarction in the left MCA territory affecting the posterior temporal lobe and parietal region having progressed to atrophy, encephalomalacia and gliosis. Areas of acute infarction representing extension of infarction in that region. 2 punctate foci of acute infarction separate from that area in the left frontal lobe.    Clinical Impression  Patient demonstrates deficits in functional mobility as indicated below. Will need continued skilled PT to address deficits and maximize function. Will see as indicated and progress as tolerated. At this time, patient demonstrates deficits compared to recent baseline levels. Patient typically able to be cared for with 1 person assist at home, tolerates transfers with 1 person assist and can maintain sitting balance without assist. Currently patient requires 2 persons maximal assist and demonstrates overall deficits in all functional tasks. Recommend ST SNF to progress strength and mobility and decreased burden of care. Family very supportive and in agreement with recommendation.     Follow Up Recommendations SNF;Supervision/Assistance - 24 hour    Equipment Recommendations  Other (comment) (hoyer lift)    Recommendations for Other Services       Precautions / Restrictions Precautions Precautions: Fall (peg tube in place, ) Restrictions Weight Bearing Restrictions: No      Mobility  Bed Mobility Overal bed mobility: Needs Assistance;+2 for physical assistance Bed Mobility: Rolling;Sidelying to Sit Rolling: Max  assist;+2 for physical assistance Sidelying to sit: Total assist;+2 for physical assistance;HOB elevated       General bed mobility comments: Max assist for rolling and bed mobility. patient was able to use LUE to grip railing when rolling to the right. Assist for LEs positioning, increased physical assist for trunk elevation to upright (total assist)  Transfers Overall transfer level: Needs assistance   Transfers: Sit to/from Stand;Stand Pivot Transfers Sit to Stand: Max assist;+2 physical assistance Stand pivot transfers: Max assist;+2 physical assistance       General transfer comment: bed sheet, chuck pad and gait belt useed with face to face and bilateral LE blocking. patient was able to take weight through her LEs during transfer  Ambulation/Gait                Stairs            Wheelchair Mobility    Modified Rankin (Stroke Patients Only) Modified Rankin (Stroke Patients Only) Pre-Morbid Rankin Score: Severe disability Modified Rankin: Severe disability     Balance Overall balance assessment: Needs assistance Sitting-balance support: Feet supported Sitting balance-Leahy Scale: Poor Sitting balance - Comments: patient initially requiring min to moderate assist to maintain balance with posterior/ anterior sway. After a few minutes patient was able to maintain midline and sit with min guard. Postural control: Right lateral lean                                   Pertinent Vitals/Pain      Home Living Family/patient expects to be discharged to:: Skilled nursing facility                 Additional Comments: Family  extremely supportive,     Prior Function Level of Independence: Needs assistance   Gait / Transfers Assistance Needed: maximal assist for sit to stand, stand pivot to chair max assist via caregiver  ADL's / Homemaking Assistance Needed: patient able to perform some self feeding with assist and setup  Comments: likes to  listen to music, family assist for transfer OOB to chair multiple times daily. Patient sits in rocking chair for 2-3 hours then transfers back to bed (total care for hygiene) per daughter patient usually able to sit EOB for 3-5 minutes before standing with 1 person max assist and transferring to chair     Hand Dominance   Dominant Hand: Right    Extremity/Trunk Assessment               Lower Extremity Assessment: Generalized weakness;Difficult to assess due to impaired cognition (history of hemiplegia from previous stroke)      Cervical / Trunk Assessment:  (increased body habitus)  Communication   Communication: Receptive difficulties;Expressive difficulties  Cognition Arousal/Alertness: Awake/alert Behavior During Therapy: Flat affect Overall Cognitive Status: Difficult to assess                      General Comments      Exercises        Assessment/Plan    PT Assessment Patient needs continued PT services  PT Diagnosis Altered mental status   PT Problem List Decreased strength;Decreased activity tolerance;Decreased balance;Decreased mobility;Decreased cognition  PT Treatment Interventions Functional mobility training;Therapeutic activities;Therapeutic exercise;Balance training;Neuromuscular re-education;Cognitive remediation   PT Goals (Current goals can be found in the Care Plan section) Acute Rehab PT Goals Patient Stated Goal: to get back to single person level of care PT Goal Formulation: With family Time For Goal Achievement: 08/11/15 Potential to Achieve Goals: Fair    Frequency Min 2X/week   Barriers to discharge        Co-evaluation               End of Session Equipment Utilized During Treatment: Gait belt Activity Tolerance: Patient tolerated treatment well Patient left: in chair;with call bell/phone within reach;with family/visitor present Nurse Communication: Mobility status;Need for lift equipment (spoke with nurse to use maxi  move for return to bed)         Time: 1191-4782 PT Time Calculation (min) (ACUTE ONLY): 41 min   Charges:   PT Evaluation $Initial PT Evaluation Tier I: 1 Procedure     PT G CodesFabio Asa 15-Aug-2015, 1:42 PM  Charlotte Crumb, PT DPT  [phone]

## 2015-07-29 DIAGNOSIS — I10 Essential (primary) hypertension: Secondary | ICD-10-CM | POA: Diagnosis present

## 2015-07-29 DIAGNOSIS — R1310 Dysphagia, unspecified: Secondary | ICD-10-CM | POA: Diagnosis present

## 2015-07-29 DIAGNOSIS — E038 Other specified hypothyroidism: Secondary | ICD-10-CM | POA: Diagnosis present

## 2015-07-29 DIAGNOSIS — I639 Cerebral infarction, unspecified: Secondary | ICD-10-CM | POA: Diagnosis present

## 2015-07-29 DIAGNOSIS — N189 Chronic kidney disease, unspecified: Secondary | ICD-10-CM | POA: Diagnosis present

## 2015-07-29 DIAGNOSIS — F329 Major depressive disorder, single episode, unspecified: Secondary | ICD-10-CM

## 2015-07-29 DIAGNOSIS — N3000 Acute cystitis without hematuria: Secondary | ICD-10-CM | POA: Diagnosis present

## 2015-07-29 DIAGNOSIS — E785 Hyperlipidemia, unspecified: Secondary | ICD-10-CM | POA: Diagnosis present

## 2015-07-29 DIAGNOSIS — J189 Pneumonia, unspecified organism: Secondary | ICD-10-CM

## 2015-07-29 LAB — GLUCOSE, CAPILLARY
GLUCOSE-CAPILLARY: 119 mg/dL — AB (ref 65–99)
GLUCOSE-CAPILLARY: 124 mg/dL — AB (ref 65–99)
Glucose-Capillary: 159 mg/dL — ABNORMAL HIGH (ref 65–99)
Glucose-Capillary: 92 mg/dL (ref 65–99)
Glucose-Capillary: 97 mg/dL (ref 65–99)

## 2015-07-29 LAB — CBC WITH DIFFERENTIAL/PLATELET
Basophils Absolute: 0 10*3/uL (ref 0.0–0.1)
Basophils Relative: 0 % (ref 0–1)
Eosinophils Absolute: 0.2 10*3/uL (ref 0.0–0.7)
Eosinophils Relative: 2 % (ref 0–5)
HEMATOCRIT: 29.9 % — AB (ref 36.0–46.0)
HEMOGLOBIN: 10.2 g/dL — AB (ref 12.0–15.0)
LYMPHS ABS: 2 10*3/uL (ref 0.7–4.0)
Lymphocytes Relative: 23 % (ref 12–46)
MCH: 29.6 pg (ref 26.0–34.0)
MCHC: 34.1 g/dL (ref 30.0–36.0)
MCV: 86.7 fL (ref 78.0–100.0)
MONOS PCT: 5 % (ref 3–12)
Monocytes Absolute: 0.5 10*3/uL (ref 0.1–1.0)
Neutro Abs: 6.1 10*3/uL (ref 1.7–7.7)
Neutrophils Relative %: 70 % (ref 43–77)
Platelets: 283 10*3/uL (ref 150–400)
RBC: 3.45 MIL/uL — ABNORMAL LOW (ref 3.87–5.11)
RDW: 17.4 % — ABNORMAL HIGH (ref 11.5–15.5)
WBC: 8.7 10*3/uL (ref 4.0–10.5)

## 2015-07-29 LAB — MAGNESIUM: MAGNESIUM: 1.9 mg/dL (ref 1.7–2.4)

## 2015-07-29 LAB — CULTURE, BLOOD (ROUTINE X 2)
Culture: NO GROWTH
Culture: NO GROWTH

## 2015-07-29 LAB — COMPREHENSIVE METABOLIC PANEL
ALK PHOS: 51 U/L (ref 38–126)
ALT: 23 U/L (ref 14–54)
AST: 24 U/L (ref 15–41)
Albumin: 2.4 g/dL — ABNORMAL LOW (ref 3.5–5.0)
Anion gap: 6 (ref 5–15)
BILIRUBIN TOTAL: 0.3 mg/dL (ref 0.3–1.2)
BUN: 11 mg/dL (ref 6–20)
CO2: 26 mmol/L (ref 22–32)
Calcium: 9 mg/dL (ref 8.9–10.3)
Chloride: 104 mmol/L (ref 101–111)
Creatinine, Ser: 0.65 mg/dL (ref 0.44–1.00)
GFR calc Af Amer: >60 mL/min (ref >60)
GFR calc non Af Amer: >60 mL/min (ref >60)
GLUCOSE: 102 mg/dL — AB (ref 65–99)
Potassium: 3.3 mmol/L — ABNORMAL LOW (ref 3.5–5.1)
SODIUM: 136 mmol/L (ref 135–145)
Total Protein: 6.3 g/dL — ABNORMAL LOW (ref 6.5–8.1)

## 2015-07-29 LAB — PHENYTOIN LEVEL, TOTAL: PHENYTOIN LVL: 15.4 ug/mL (ref 10.0–20.0)

## 2015-07-29 LAB — TSH: TSH: 0.468 u[IU]/mL (ref 0.350–4.500)

## 2015-07-29 MED ORDER — POTASSIUM CHLORIDE 20 MEQ/15ML (10%) PO SOLN
20.0000 meq | Freq: Two times a day (BID) | ORAL | Status: DC
  Administered 2015-07-29 – 2015-07-30 (×3): 20 meq
  Filled 2015-07-29 (×3): qty 15

## 2015-07-29 MED ORDER — POTASSIUM CHLORIDE 20 MEQ/15ML (10%) PO SOLN: 20.0000 meq | Freq: Two times a day (BID) | ORAL | Status: DC

## 2015-07-29 MED ORDER — HYDRALAZINE HCL 20 MG/ML IJ SOLN: 2.0000 mg | INTRAMUSCULAR | Status: DC | PRN

## 2015-07-29 NOTE — Progress Notes (Signed)
STROKE TEAM PROGRESS NOTE   HISTORY Melissa [name] is an 69 y.o. 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 She presents to the emergency department with possible left-sided seizure. In ED she was found to have UTI and was admitted for treatment of UTI and ongoing jerking movements of the left arm and leg. patient received Ativan once while in ED which seemed to stop the movements per family. She was also loaded with 1000 mg Keppra and currently on 500 mg BID. The left arm and leg continues to have non-rhythmic jerking which could be myoclonus versus seizure. Neurology was asked to evaluate.   Patient was not considered for TPA secondary to non-stroke presentation, stroke not recognized.    SUBJECTIVE (INTERVAL HISTORY) The patient's daughter is at the bedside. She has not seen any further seizure like activity. She is concerned that the patient's glucose is running high well on the nutritional supplement. (Recent charted glucose levels are not significantly elevated )  OBJECTIVE Temp:  [98.4 F (36.9 C)-99.5 F (37.5 C)] 98.6 F (37 C) (08/06 0938) Pulse Rate:  [70-96] 70 (08/06 0938) Cardiac Rhythm:  [-] Normal sinus rhythm (08/06 0800) Resp:  [18-24] 18 (08/06 0938) BP: (97-174)/(65-102) 119/83 mmHg (08/06 0938) SpO2:  [94 %-100 %] 100 % (08/06 0938)   Recent Labs Lab 07/28/15 1745 07/28/15 2054 07/29/15 0001 07/29/15 0333 07/29/15 1102  GLUCAP 139* 162* 159* 97 119*    Recent Labs Lab 07/24/15 1506 07/25/15 0545 07/27/15 1110 07/28/15 0550 07/29/15 0930  NA 140 137 138 137 136  K 3.7 3.6 3.4* 3.9 3.3*  CL 105 103 105 102 104  CO2 $Re'29 28 29 27 26  'Hzi$ GLUCOSE 83 82 133* 97 102*  BUN $Re'10 12 17 13 11  'oLG$ CREATININE 0.69 0.86 0.77 0.67 0.65  CALCIUM 9.7 9.5 9.2 9.3 9.0  MG  --   --   --   --  1.9    Recent  Labs Lab 07/25/15 0545 07/29/15 0930  AST 18 24  ALT 20 23  ALKPHOS 40 51  BILITOT 0.6 0.3  PROT 7.3 6.3*  ALBUMIN 2.8* 2.4*    Recent Labs Lab 07/24/15 1506 07/27/15 1110 07/28/15 0550 07/29/15 0930  WBC 7.2 6.9 5.7 8.7  NEUTROABS  --   --   --  6.1  HGB 12.7 11.0* 11.3* 10.2*  HCT 36.3 31.7* 32.7* 29.9*  MCV 87.3 88.1 87.0 86.7  PLT 271 271 288 283   No results for input(s): CKTOTAL, CKMB, CKMBINDEX, TROPONINI in the last 168 hours. No results for input(s): LABPROT, INR in the last 72 hours. No results for input(s): COLORURINE, LABSPEC, Clifford, GLUCOSEU, HGBUR, BILIRUBINUR, KETONESUR, PROTEINUR, UROBILINOGEN, NITRITE, LEUKOCYTESUR in the last 72 hours.  Invalid input(s): APPERANCEUR     Component Value Date/Time   CHOL 140 04/03/2015 1122   TRIG 153* 04/03/2015 1122   HDL 37* 04/03/2015 1122   CHOLHDL 3.8 04/03/2015 1122   VLDL 31 04/03/2015 1122   LDLCALC 72 04/03/2015 1122   Lab Results  Component Value Date   HGBA1C 5.8* 04/03/2015   No results found for: LABOPIA, COCAINSCRNUR, LABBENZ, AMPHETMU, THCU, LABBARB  No results for input(s): ETH in the last 168 hours.  I have personally reviewed the radiological images below and agree with the radiology interpretations. Blue text is my interpretation.  Dg Chest  1 View 07/24/2015   1. Left lower lobe atelectasis or pneumonia. 2. Mild right basilar atelectasis. 3. Stable cardiomegaly.     Ct Head Wo Contrast 07/24/2015    No acute intracranial abnormality. Stable bilateral old infarcts as described above. Stable mild cerebral atrophy.     Mr Brain Wo Contrast 07/24/2015   Old infarction in the left MCA territory affecting the posterior temporal lobe and parietal region having progressed to atrophy, encephalomalacia and gliosis. Areas of acute infarction representing extension of infarction in that region. 2 punctate foci of acute infarction separate from that area in the left frontal lobe.  Extensive old infarctions  elsewhere throughout the brain  Discussed with Dr. Vernard Gambles regarding the DWI findings. We are not convinced that is definite acute infarct as it may represent laminar necrosis or mineral deposit in the previous infarct region and CT earlier has shown the changes.   Ct Head Wo Contrast 09/18/2014 Large LEFT MCA territory infarct involving LEFT temporal and parietal lobes. No acute intracranial hemorrhage or midline shift. Old small high RIGHT frontal and questionable LEFT cerebellar infarcts.   Mr Brain Wo Contrast 09/19/2014 1. Large acute left MCA infarct. Cytotoxic edema and mass effect but no associated hemorrhage at this time. 2. Numerous scattered small acute infarcts in the bilateral MCA, right PCA, and bilateral PICA territories. Constellation most compatible with recent embolic event (cardiac or proximal aortic source).   Mr Jodene Nam Head Wo Contrast 09/19/2014 3. Left MCA occlusion in the M1 segment. Some reconstituted flow only in the anterior left MCA division. 4. No other major circle of Willis branch occlusion. Moderate irregularity of both ICA siphons.   Right Elbow 2 view x-ray 07/26/2015 No definite acute fracture or dislocation is observed.  There is a joint effusion.  Degenerative changes associated with the posterior aspect of the elbow joint are present.  There has not been significant interval change since the previous study.  TEE 09/23/14 - Intermittent large right to left shunt across a PFO, during quiet respiration - possible pathway for paradoxical embolism  Venous doppler LEs 09/23/14 - No evidence of deep vein or superficial thrombosis involving the right lower extremity and left lower extremity. - No evidence of Baker&'s cyst on the right or left.    Carotid Doppler  07/25/15 Bilateral: 1-39% ICA stenosis. Vertebral artery flow is antegrade on the right. The left vertebral artery flow could not be insonated due to involuntary movement of the body due to seizure  type activity  2D Echocardiogram  07/25/15 - Left ventricle: The cavity size was mildly dilated. Wallthickness was normal. Systolic function was moderately reduced.The estimated ejection fraction was in the range of 35% to 40%.Diffuse hypokinesis. Doppler parameters are consistent withabnormal left ventricular relaxation (grade 1 diastolicdysfunction). - Mitral valve: There was mild regurgitation. - Pulmonary arteries: Systolic pressure was moderately increased.PA peak pressure: 41 mm Hg (S).  EEG 07/25/15 1) Frequent left hemispheric seizures with a wide field 2) periodic lateralized epileptiform discharges associated with abnormal movements  Clinical Interpretation: This EEG is consistent with frequent seizures arising from the left hemisphere with a frontotemporal predominance.   EEG 07/26/15 1) Frequent left hemispheric seizures with a wide field 2) periodic lateralized epileptiform discharges associated with abnormal movements  Clinical Interpretation: This EEG is consistent with frequent seizures arising from the left hemisphere with a frontotemporal predominance. PLEDs can be ictal in nature, representing ongoing seizure activity.  EEG 07/27/15 - EEG Abnormalities: 1) periodic lateralized epileptiform discharges(PLEDs)  Clinical Interpretation: This  EEG is most consistent with an area of irritability which could serve as a focus for seizures in the left hemisphere. There were no definite periods of evolution signifying definite seizure on todays study. PLEDs are indeterminate and can be post-ictal or associated with an area of injury, but can be ictal in some circumstances.   Venous doppler - No evidence of DVT or superficial thrombosis at UEs and LEs.   PHYSICAL EXAM  Temp:  [98.4 F (36.9 C)-99.5 F (37.5 C)] 98.6 F (37 C) (08/06 0938) Pulse Rate:  [70-96] 70 (08/06 0938) Resp:  [18-24] 18 (08/06 0938) BP: (97-174)/(65-102) 119/83 mmHg (08/06 0938) SpO2:  [94 %-100 %] 100 %  (08/06 0938)  General - Well nourished, well developed, awake with eyes open, tracking to people, no language output, not following commands.  Ophthalmologic - Fundi not visualized due to noncooperation.  Cardiovascular - Regular rate and rhythm  Neuro - awake, eyes open, but not able to follow any commands, no language output. PERRL, visual neglect on the right side, blinking to visual threat on the left, left-sided eye gaze, right facial droop, hemiplegic on the right. RLE withdraw to pain but LUE and LLE spontaneous movement. No focal seizures noted ASSESSMENT/PLAN [name] is a 69 y.o. female with history of multiple myeloma, right sided hemiparesis from previous left MCA large stroke, dysphagia status post PEG tube, who is bedbound, and speaks a few words at baseline presenting with seizures. She was not considered for IV t-PA due to non-stroke presentation.   Simple partial seizure - new onset  Please make left hand and left food jerking, spreading to right foot  EEG showed left-sided seizure activity with wide field  EEG 07/26/2015 and 07/25/15 - frequent seizure from left hemisphere  EEG 07/27/2015 - PLEDs, no seizure  Try to avoid benzo and intubation   On Keppra 1000 mg twice a day. Increase to $RemoveBef'1500mg'pFZnHykVro$  bid, change to po  Vimpat added 07/27/2015 for continued LLE mild seizure activity.  Change fosphenytoin to PO dilantin  Dilantin level 17.4 -> 18.5 -> 15.4 Saturday  May be able to taper anti-epileptic medications to improve alertness.   Stroke:  Chronic left posterior temporal/parietal infarct with laminar necrosis (doubt acute stroke, discussed with radiologist). Two punctate infarcts in L frontal areas. Infarcts felt to be embolic vs. Hypotension.   Resultant  baseline right hemiplegia  MRI  as above   MRA in 08/2014 showed right M1 cut off.   Carotid Doppler  No significant stenosis   2D Echo  EF 35-40% down from 08/2014 60-65%  TEE 09/2014 EF  65-70%, no SOE but large PFO  Venous doppler - No evidence of DVT or superficial thrombosis.  Family would like aggressive work up but EP consulted dose not think she is a good candidate for loop recorder. Also pt at this time is not a good candidate for anticoagulation.   LDL 72  HgbA1c 5.8 in April  Lovenox 40 mg sq daily for VTE prophylaxis   . ST swallow eval held d/t ongoing seizures  aspirin 162 mg orally every day prior to admission, now on aspirin 162 mg orally every day  Ongoing aggressive stroke risk factor management  Therapy recommendations:  Skilled nursing facility recommended  Followed by Dr. Merlene Laughter PTA  Disposition:  The family has agreed to SNF placement per case manager's note.  Hx stroke/TIA   Large left MCA infarct in setting of L MCA M1 occlusion, and numerous scattered small acute  infarcts in the bilateral MCA, right PCA, and bilateral PICA territories, Consistent with embolic most likely secondary to either cardioembolic or hypercoagulable state from malignancy or chemo meds  TEE showed large PFO but no LE DVT  Loop recorder recommended but not put in - cardiology consult today but does not think patient is a candidate for loop placement.  At this time, pt seems not a good candidate for aggressive anticoagulation with coumadin, she will need follow up with Dr. Merlene Laughter as outpt.   UTI  Many bateria in UA but WBC 0-2  On rocephine Day # 5/5  T Max 99.5 today  Culture NGTD  May contribute to lowered seizure threshold   Malnutrition  Has PEG for poor oral intake, able to swallow at baseline Body mass index is 32.15 kg/(m^2).   Hypertension  Home meds:   Norvasc, coreg, clonidine  stable  Hyperlipidemia  Home meds:  mevacor 40 mg daily, resumed in hospital, though pt remains NPO awaiting seizure activity to stop prior to eval  LDL 72, goal < 70  Continue statin at discharge  Other Stroke Risk Factors  Advanced age  Obesity,  Body mass index is 32.15 kg/(m^2).   Other Active Problems  Hypothyroid  CKD stage III  dpression  COPD  CHF, pulmonary HTN  Right elbow x-ray - 07/26/2015 - effusion. No fracture.  Cardiology consult obtained - no loop placement recommended at this time.   Follow-up 30 day event monitor previously performed which was negative as per family.  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  Dr. Farrel Gobble - on Revlimid, known to have risk of PNA and stroke   Hospital day # [facility] Pager [phone] 07/29/2015, 11:06 AM  69 yo F with hx of multiple stroke, bedbound at home was admitted for seizure activity. EEG confirmed seizure from left hemisphere, but pt presented with bilateral shaking jerking and L>R. Put on dilantin, keppra, and vimpat. Seizure under control. MRI showed 2 punctate left MCA infarcts, concerning for embolic vs. Hypotension as pt has left M1 cut off. However, pt does not appear to be candidate for loop or anticoagulation due to poor functional status and mRS = 4. Will continue medical management to stabilize pt and pt will follow up with Dr. Merlene Laughter. D/w daughter at bedside and answered questions.May reduce and stop dilantin if seizure free over next few days Antony Contras, MD   To contact Stroke Continuity provider, please refer to [email or web address] After hours, contact General Neurology

## 2015-07-29 NOTE — Care Management Note (Signed)
Case Management Note  Patient Details  Name: Melissa [name] MRN: [id number] Date of Birth: 1946-07-27  Subjective/Objective:                    Action/Plan:   Expected Discharge Date:                  Expected Discharge Plan:  [place] (Patient lives at home with daughters.)  In-House Referral:  Clinical Social Work  Discharge planning Services  CM Consult  Post Acute Care Choice:    Choice offered to:     DME Arranged:    DME Agency:   (Patient receives DME/supplies from [facility])  [place] Arranged:    [place] Agency:   (Patient has used [facility] in the past, would like to use them again)  Status of Service:  Completed, signed off  Medicare Important Message Given:  Yes-second notification given Date Medicare IM Given:  07/29/15  Medicare IM give by:   Frann Rider, RN, BSN Date Additional Medicare IM Given:    Additional Medicare Important Message give by:     If discussed at [place] of Stay Meetings, dates discussed:    Additional Comments:  Norina Buzzard, RN 07/29/2015, 11:49 AM

## 2015-07-29 NOTE — Progress Notes (Signed)
[facility] PROGRESS NOTE  Melissa [name] WGN:[id number] DOB: June 06, 1946 DOA: 07/24/2015 PCP: [name], MD HPI/Subjective: 69 y.o. 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 She presents to the emergency department with left-sided seizure. Per her primary caretaker [name], the patient has been feeling well recently aside from left hip and leg pain thought to be due to sciatica. This morning at approximately 8 AM [name] noticed her mother's left lower extremity twitching. Within about 30 minutes the twitching grew stronger and her left upper extremity began twitching as well. Her mother became unresponsive. And turned her face towards the right. After approximately an hour the twitching decreased in severity and her mother became a little bit more responsive. In the emergency department, UA is positive for many bacteria, CT head is negative for acute changes, chest x-ray shows atelectasis versus left lower lobe pneumonia.      Assessment/Plan: Seizure ER physician spoke with neuro (Dr. Hosie Poisson) who recommended an MRI, starting the patient on Keppra, and outpatient follow-up. If MRI is positive for acute changes please consult neuro. Neuro mentioned that any infection could trigger a seizure. Patient will be started on Keppra 500 mg twice a day. Need to determine from pharmacy whether or not this can be placed through a PEG tube.  Acute stroke?   UTI -UA appears infected. Culture pending -Levaquin started  Left lower lobe pneumonia(CAP) vs atelectasis Speech therapy consultation requested to recommend a safe diet for recommend nothing by mouth. Patient started on Levaquin.  Left hip pain Thought to be sciatica. Patient started on Neurontin. Received no relief  with Neurontin. Will x-ray left hip. Continue Vicodin when necessary.  Chronic kidney disease Creatinine at baseline  Hypertension -target BP is SBP 145-180 -Continue carvedilol, 6.25 mg BID -Clonidine patch 0.3 mg -lisinopril 5 mg daily -hydralazine IV 2 mg PRN SBP> 180   Hyperlipidemia Continue pravastatin  Depression  continue Cymbalta and Klonopin. Per pharmacy rec Prozac is being tapered off. We'll discontinue  Hypothyroidism Continue Synthroid  Dysphagia Continue PEG tube feedings with free water flushes. Nutrition consulted.  Code Status: DO NOT RESUSCITATE Family Communication: daughters Disposition Plan: SNF/per stroke team   Consultants:    Procedures: 8/1 CT head without contrast; -old infarct in left MCA territory.- No acute cortical infarction. No mass lesion - Stable old infarcts in right frontal lobe and right cerebellum. 8/1 MRI brain without contrast;Old infarction in the left MCA territory affecting the posterior temporal lobe and parietal region having progressed to atrophy/encephalomalacia and gliosis.  -Acute infarction representing extension of infarction in that region.  -2 punctate foci of acute infarction separate from that area in the left frontal lobe.    Cultures 8/1 urine negative 8/1 blood right arm/hand negative  Antibiotics: Levofloxacin Ceftin    DVT prophylaxis     Objective: Filed Vitals:   07/29/15 0700 07/29/15 0938 07/29/15 1327 07/29/15 1724  BP: 174/65 119/83  181/90  Pulse: 96 70 78 87  Temp: 99.5 F (37.5 C) 98.6 F (37 C) 98.5 F (36.9 C) 99.6 F (37.6 C)  TempSrc: Oral Axillary Oral Oral  Resp: 20 18 18 18   Height:      Weight: 89.54 kg (197 lb 6.4 oz)     SpO2: 94% 100% 100% 100%   No intake or output  data in the 24 hours ending 07/29/15 2133 Filed Weights   07/27/15 0400 07/28/15 0414 07/29/15 0700  Weight: 89.268 kg (196 lb 12.8 oz) 90.311 kg (199 lb 1.6 oz) 89.54 kg (197 lb 6.4 oz)      Exam: General: Obtunded, No acute respiratory distress Eyes:negative scleral hemorrhage ENT: Negative Runny nose,negative gingival bleeding, Neck:  Negative scars, masses, torticollis, lymphadenopathy, JVD Lungs: Clear to auscultation bilaterally without wheezes or crackles Cardiovascular: Regular rate and rhythm without murmur gallop or rub normal S1 and S2 Abdomen:negative abdominal pain, negative dysphagia, nondistended, positive soft, bowel sounds, no rebound, no ascites, no appreciable mass Extremities: No significant cyanosis, clubbing, or edema bilateral lower extremities Psychiatric:  Unable to assess Neurologic:  obtunded  Data Reviewed: Basic Metabolic Panel:  Recent Labs Lab 07/24/15 1506 07/25/15 0545 07/27/15 1110 07/28/15 0550 07/29/15 0930  NA 140 137 138 137 136  K 3.7 3.6 3.4* 3.9 3.3*  CL 105 103 105 102 104  CO2 29 28 29 27 26   GLUCOSE 83 82 133* 97 102*  BUN 10 12 17 13 11   CREATININE 0.69 0.86 0.77 0.67 0.65  CALCIUM 9.7 9.5 9.2 9.3 9.0  MG  --   --   --   --  1.9   Liver Function Tests:  Recent Labs Lab 07/25/15 0545 07/29/15 0930  AST 18 24  ALT 20 23  ALKPHOS 40 51  BILITOT 0.6 0.3  PROT 7.3 6.3*  ALBUMIN 2.8* 2.4*   No results for input(s): LIPASE, AMYLASE in the last 168 hours. No results for input(s): AMMONIA in the last 168 hours. CBC:  Recent Labs Lab 07/24/15 1506 07/27/15 1110 07/28/15 0550 07/29/15 0930  WBC 7.2 6.9 5.7 8.7  NEUTROABS  --   --   --  6.1  HGB 12.7 11.0* 11.3* 10.2*  HCT 36.3 31.7* 32.7* 29.9*  MCV 87.3 88.1 87.0 86.7  PLT 271 271 288 283   Cardiac Enzymes: No results for input(s): CKTOTAL, CKMB, CKMBINDEX, TROPONINI in the last 168 hours. BNP (last 3 results) No results for input(s): BNP in the last 8760 hours.  ProBNP (last 3 results)  Recent Labs  09/19/14 1004  PROBNP 1843.0*    CBG:  Recent Labs Lab 07/29/15 0001 07/29/15 0333 07/29/15 1102 07/29/15 1625 07/29/15 2002   GLUCAP 159* 97 119* 92 124*    Recent Results (from the past 240 hour(s))  Culture, Urine     Status: None   Collection Time: 07/24/15  2:15 PM  Result Value Ref Range Status   Specimen Description URINE, RANDOM  Final   Special Requests CX ADDED AT 0215 ON [id number]  Final   Culture NO GROWTH 1 DAY  Final   Report Status 07/26/2015 FINAL  Final  Culture, blood (routine x 2)     Status: None   Collection Time: 07/24/15  5:20 PM  Result Value Ref Range Status   Specimen Description BLOOD RIGHT ARM  Final   Special Requests BOTTLES DRAWN AEROBIC AND ANAEROBIC 10CC  Final   Culture NO GROWTH 5 DAYS  Final   Report Status 07/29/2015 FINAL  Final  Culture, blood (routine x 2)     Status: None   Collection Time: 07/24/15  5:25 PM  Result Value Ref Range Status   Specimen Description BLOOD RIGHT HAND  Final   Special Requests BOTTLES DRAWN AEROBIC ONLY 10CC  Final   Culture NO GROWTH 5 DAYS  Final   Report Status 07/29/2015 FINAL  Final  Studies: No results found.  Scheduled Meds: . carvedilol  6.25 mg Per Tube BID WC  . cloNIDine  0.3 mg Transdermal Weekly  . clopidogrel  75 mg Oral Daily  . DULoxetine  30 mg Oral BID  . enoxaparin (LOVENOX) injection  40 mg Subcutaneous Q24H  . feeding supplement (OSMOLITE 1.5 CAL)  237 mL Per Tube 5 X Daily  . feeding supplement (PRO-STAT SUGAR FREE 64)  30 mL Per Tube BID  . free water  100 mL Per Tube 6 times per day  . lacosamide  100 mg Oral BID  . levETIRAcetam  1,500 mg Oral BID  . levothyroxine  75 mcg Per Tube QAC breakfast  . lisinopril  5 mg Oral Daily  . phenytoin  100 mg Oral TID  . potassium chloride  20 mEq Per Tube BID  . pravastatin  40 mg Oral q1800  . sodium chloride  3 mL Intravenous Q12H  . sorbitol  30 mL Per Tube Daily   Continuous Infusions:   Principal Problem:   Seizure Active Problems:   Hypothyroid   Obesity   Depression   VERTIGO, INTERMITTENT   Hyperlipidemia   Hypertension   History of kidney  cancer   Anemia   CKD (chronic kidney disease) stage 3, GFR 30-59 ml/min   Chronic ischemic left MCA stroke   Left lower lobe pneumonia   Urinary tract infection   Left hip pain   Hemiplegia affecting right dominant side   Multiple myeloma   Acute ischemic stroke   Systolic dysfunction without heart failure   Partial seizure   Right elbow pain   Stroke   Acute cystitis without hematuria   CAP (community acquired pneumonia)   CKD (chronic kidney disease)   Essential hypertension   HLD (hyperlipidemia)   Other specified hypothyroidism   Dysphagia    Time spent: 40 min    WOODS, CURTIS J  [facility] Pager [phone]. If 7PM-7AM, please contact night-coverage at [email or web address], password [email or web address] 07/29/2015, 9:33 PM  LOS: 5 days    Care during the described time interval was provided by me .  I have reviewed this patient's available data, including medical history, events of note, physical examination, and all test results as part of my evaluation. I have personally reviewed and interpreted all radiology studies.   Carolyne Littles, MD [phone] Pager

## 2015-07-30 ENCOUNTER — Inpatient Hospital Stay (HOSPITAL_COMMUNITY): Payer: Medicare Other

## 2015-07-30 DIAGNOSIS — M21371 Foot drop, right foot: Secondary | ICD-10-CM

## 2015-07-30 DIAGNOSIS — M25552 Pain in left hip: Secondary | ICD-10-CM

## 2015-07-30 DIAGNOSIS — R509 Fever, unspecified: Secondary | ICD-10-CM

## 2015-07-30 LAB — GLUCOSE, CAPILLARY
GLUCOSE-CAPILLARY: 91 mg/dL (ref 65–99)
GLUCOSE-CAPILLARY: 76 mg/dL (ref 65–99)
GLUCOSE-CAPILLARY: 133 mg/dL — AB (ref 65–99)
GLUCOSE-CAPILLARY: 102 mg/dL — AB (ref 65–99)
Glucose-Capillary: 106 mg/dL — ABNORMAL HIGH (ref 65–99)
Glucose-Capillary: 87 mg/dL (ref 65–99)

## 2015-07-30 LAB — PHENYTOIN LEVEL, TOTAL: Phenytoin Lvl: 12.5 ug/mL (ref 10.0–20.0)

## 2015-07-30 MED ORDER — LEVETIRACETAM 100 MG/ML PO SOLN
1000.0000 mg | Freq: Two times a day (BID) | ORAL | Status: DC
  Administered 2015-07-30 – 2015-08-03 (×8): 1000 mg via ORAL
  Filled 2015-07-30 (×9): qty 10

## 2015-07-30 MED ORDER — POTASSIUM CHLORIDE 20 MEQ/15ML (10%) PO SOLN
20.0000 meq | Freq: Three times a day (TID) | ORAL | Status: DC
  Administered 2015-07-31 – 2015-08-03 (×10): 20 meq via ORAL
  Filled 2015-07-30 (×11): qty 15

## 2015-07-30 MED ORDER — POTASSIUM CHLORIDE CRYS ER 20 MEQ PO TBCR
20.0000 meq | EXTENDED_RELEASE_TABLET | Freq: Three times a day (TID) | ORAL | Status: DC
  Administered 2015-07-30 (×2): 20 meq via ORAL
  Filled 2015-07-30 (×2): qty 1

## 2015-07-30 MED ORDER — ACETAMINOPHEN 325 MG RE SUPP: 325.0000 mg | RECTAL | Status: DC | PRN

## 2015-07-30 NOTE — Progress Notes (Signed)
STROKE TEAM PROGRESS NOTE   HISTORY Melissa [name] is an 69 y.o. 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 She presents to the emergency department with possible left-sided seizure. In ED she was found to have UTI and was admitted for treatment of UTI and ongoing jerking movements of the left arm and leg. patient received Ativan once while in ED which seemed to stop the movements per family. She was also loaded with 1000 mg Keppra and currently on 500 mg BID. The left arm and leg continues to have non-rhythmic jerking which could be myoclonus versus seizure. Neurology was asked to evaluate.   Patient was not considered for TPA secondary to non-stroke presentation, stroke not recognized.    SUBJECTIVE (INTERVAL HISTORY) The patient's daughter is at bedside. The patient appears more alert today. She still is not able to communicate. Dr. Leonie Man has recommended tapering and possibly discontinuing the Keppra while continuing the Dilantin and Vimpat. No further seizure activity has been noted. We asked nursing to get the patient out of bed to a chair today.  OBJECTIVE Temp:  [98 F (36.7 C)-99.6 F (37.6 C)] 98 F (36.7 C) (08/07 1035) Pulse Rate:  [81-88] 88 (08/07 1035) Cardiac Rhythm:  [-]  Resp:  [18-20] 20 (08/07 1035) BP: (154-181)/(75-118) 154/89 mmHg (08/07 1035) SpO2:  [100 %] 100 % (08/07 1035) Weight:  [89.858 kg (198 lb 1.6 oz)] 89.858 kg (198 lb 1.6 oz) (08/07 0627)   Recent Labs Lab 07/29/15 2002 07/29/15 2359 07/30/15 0433 07/30/15 0735 07/30/15 1110  GLUCAP 124* 87 91 76 106*    Recent Labs Lab 07/24/15 1506 07/25/15 0545 07/27/15 1110 07/28/15 0550 07/29/15 0930  NA 140 137 138 137 136  K 3.7 3.6 3.4* 3.9 3.3*  CL 105 103 105 102 104  CO2 $Re'29 28 29 27 26  'yjC$ GLUCOSE 83 82 133* 97 102*  BUN $Re'10 12 17  13 11  'QvJ$ CREATININE 0.69 0.86 0.77 0.67 0.65  CALCIUM 9.7 9.5 9.2 9.3 9.0  MG  --   --   --   --  1.9    Recent Labs Lab 07/25/15 0545 07/29/15 0930  AST 18 24  ALT 20 23  ALKPHOS 40 51  BILITOT 0.6 0.3  PROT 7.3 6.3*  ALBUMIN 2.8* 2.4*    Recent Labs Lab 07/24/15 1506 07/27/15 1110 07/28/15 0550 07/29/15 0930  WBC 7.2 6.9 5.7 8.7  NEUTROABS  --   --   --  6.1  HGB 12.7 11.0* 11.3* 10.2*  HCT 36.3 31.7* 32.7* 29.9*  MCV 87.3 88.1 87.0 86.7  PLT 271 271 288 283   No results for input(s): CKTOTAL, CKMB, CKMBINDEX, TROPONINI in the last 168 hours. No results for input(s): LABPROT, INR in the last 72 hours. No results for input(s): COLORURINE, LABSPEC, [place], GLUCOSEU, HGBUR, BILIRUBINUR, KETONESUR, PROTEINUR, UROBILINOGEN, NITRITE, LEUKOCYTESUR in the last 72 hours.  Invalid input(s): APPERANCEUR     Component Value Date/Time   CHOL 140 04/03/2015 1122   TRIG 153* 04/03/2015 1122   HDL 37* 04/03/2015 1122   CHOLHDL 3.8 04/03/2015 1122   VLDL 31 04/03/2015 1122   LDLCALC 72 04/03/2015 1122   Lab Results  Component Value Date   HGBA1C 5.8* 04/03/2015   No results found for: LABOPIA, COCAINSCRNUR, LABBENZ, AMPHETMU, THCU, LABBARB  No results for input(s): ETH in  the last 168 hours.  I have personally reviewed the radiological images below and agree with the radiology interpretations. Blue text is my interpretation.  Dg Chest 1 View 07/24/2015   1. Left lower lobe atelectasis or pneumonia. 2. Mild right basilar atelectasis. 3. Stable cardiomegaly.     Ct Head Wo Contrast 07/24/2015    No acute intracranial abnormality. Stable bilateral old infarcts as described above. Stable mild cerebral atrophy.     Mr Brain Wo Contrast 07/24/2015   Old infarction in the left MCA territory affecting the posterior temporal lobe and parietal region having progressed to atrophy, encephalomalacia and gliosis. Areas of acute infarction representing extension of infarction in that region.  2 punctate foci of acute infarction separate from that area in the left frontal lobe.  Extensive old infarctions elsewhere throughout the brain  Discussed with Dr. Vernard Gambles regarding the DWI findings. We are not convinced that is definite acute infarct as it may represent laminar necrosis or mineral deposit in the previous infarct region and CT earlier has shown the changes.   Ct Head Wo Contrast 09/18/2014 Large LEFT MCA territory infarct involving LEFT temporal and parietal lobes. No acute intracranial hemorrhage or midline shift. Old small high RIGHT frontal and questionable LEFT cerebellar infarcts.   Mr Brain Wo Contrast 09/19/2014 1. Large acute left MCA infarct. Cytotoxic edema and mass effect but no associated hemorrhage at this time. 2. Numerous scattered small acute infarcts in the bilateral MCA, right PCA, and bilateral PICA territories. Constellation most compatible with recent embolic event (cardiac or proximal aortic source).   Mr Jodene Nam Head Wo Contrast 09/19/2014 3. Left MCA occlusion in the M1 segment. Some reconstituted flow only in the anterior left MCA division. 4. No other major circle of Willis branch occlusion. Moderate irregularity of both ICA siphons.   Right Elbow 2 view x-ray 07/26/2015 No definite acute fracture or dislocation is observed.  There is a joint effusion.  Degenerative changes associated with the posterior aspect of the elbow joint are present.  There has not been significant interval change since the previous study.  TEE 09/23/14 - Intermittent large right to left shunt across a PFO, during quiet respiration - possible pathway for paradoxical embolism  Venous doppler LEs 09/23/14 - No evidence of deep vein or superficial thrombosis involving the right lower extremity and left lower extremity. - No evidence of Baker&'s cyst on the right or left.    Carotid Doppler  07/25/15 Bilateral: 1-39% ICA stenosis. Vertebral artery flow is antegrade on the  right. The left vertebral artery flow could not be insonated due to involuntary movement of the body due to seizure type activity  2D Echocardiogram  07/25/15 - Left ventricle: The cavity size was mildly dilated. Wallthickness was normal. Systolic function was moderately reduced.The estimated ejection fraction was in the range of 35% to 40%.Diffuse hypokinesis. Doppler parameters are consistent withabnormal left ventricular relaxation (grade 1 diastolicdysfunction). - Mitral valve: There was mild regurgitation. - Pulmonary arteries: Systolic pressure was moderately increased.PA peak pressure: 41 mm Hg (S).  EEG 07/25/15 1) Frequent left hemispheric seizures with a wide field 2) periodic lateralized epileptiform discharges associated with abnormal movements  Clinical Interpretation: This EEG is consistent with frequent seizures arising from the left hemisphere with a frontotemporal predominance.   EEG 07/26/15 1) Frequent left hemispheric seizures with a wide field 2) periodic lateralized epileptiform discharges associated with abnormal movements  Clinical Interpretation: This EEG is consistent with frequent seizures arising from the left hemisphere with a  frontotemporal predominance. PLEDs can be ictal in nature, representing ongoing seizure activity.  EEG 07/27/15 - EEG Abnormalities: 1) periodic lateralized epileptiform discharges(PLEDs)  Clinical Interpretation: This EEG is most consistent with an area of irritability which could serve as a focus for seizures in the left hemisphere. There were no definite periods of evolution signifying definite seizure on todays study. PLEDs are indeterminate and can be post-ictal or associated with an area of injury, but can be ictal in some circumstances.   Venous doppler - No evidence of DVT or superficial thrombosis at UEs and LEs.   PHYSICAL EXAM  Temp:  [98 F (36.7 C)-99.6 F (37.6 C)] 98 F (36.7 C) (08/07 1035) Pulse Rate:  [81-88] 88  (08/07 1035) Resp:  [18-20] 20 (08/07 1035) BP: (154-181)/(75-118) 154/89 mmHg (08/07 1035) SpO2:  [100 %] 100 % (08/07 1035) Weight:  [89.858 kg (198 lb 1.6 oz)] 89.858 kg (198 lb 1.6 oz) (08/07 0627)  General - Well nourished, well developed, awake with eyes open, tracking to people, no language output, not following commands.  Ophthalmologic - Fundi not visualized due to noncooperation.  Cardiovascular - Regular rate and rhythm  Neuro - awake, eyes open, but not able to follow any commands, no language output. PERRL, visual neglect on the right side, blinking to visual threat on the left, left-sided eye gaze, right facial droop, hemiplegic on the right. RLE withdraw to pain but LUE and LLE spontaneous movement. No focal seizures noted ASSESSMENT/PLAN [name] is a 69 y.o. female with history of multiple myeloma, right sided hemiparesis from previous left MCA large stroke, dysphagia status post PEG tube, who is bedbound, and speaks a few words at baseline presenting with seizures. She was not considered for IV t-PA due to non-stroke presentation.   Simple partial seizure - new onset  Please make left hand and left food jerking, spreading to right foot  EEG showed left-sided seizure activity with wide field  EEG 07/26/2015 and 07/25/15 - frequent seizure from left hemisphere  EEG 07/27/2015 - PLEDs, no seizure  Try to avoid benzo and intubation   On Keppra 1000 mg twice a day. Increase to $RemoveBef'1500mg'[id number]$  bid, change to po  Vimpat added 07/27/2015 for continued LLE mild seizure activity.  May be able to taper anti-epileptic medications to improve alertness.  Change fosphenytoin to PO dilantin  Dilantin level 17.4 -> 18.5 -> 15.4 Saturday -> 12.5 Sunday (daily levels ordered) May need to increase dose.  Per Dr. Leonie Man will decrease Keppra dose from 1500 mg twice daily to 1000 mg twice daily. Plan to taper and  discontinue if patient tolerates   Stroke:  Chronic left posterior  temporal/parietal infarct with laminar necrosis (doubt acute stroke, discussed with radiologist). Two punctate infarcts in L frontal areas. Infarcts felt to be embolic vs. Hypotension.   Resultant  baseline right hemiplegia  MRI  as above   MRA in 08/2014 showed right M1 cut off.   Carotid Doppler  No significant stenosis   2D Echo  EF 35-40% down from 08/2014 60-65% - monitor for signs or symptoms of congestive heart failure  TEE 09/2014 EF 65-70%, no SOE but large PFO  Venous doppler - No evidence of DVT or superficial thrombosis.  Family would like aggressive work up but EP consulted dose not think she is a good candidate for loop recorder. Also pt at this time is not a good candidate for anticoagulation.   LDL 72  HgbA1c 5.8 in April  Lovenox 40  mg sq daily for VTE prophylaxis   . ST swallow eval held d/t ongoing seizures  aspirin 162 mg orally every day prior to admission, now on aspirin 162 mg orally every day  Ongoing aggressive stroke risk factor management  Therapy recommendations:  Skilled nursing facility recommended  Followed by Dr. Merlene Laughter PTA  Disposition:  The family has agreed to SNF placement per case manager's note.  Hx stroke/TIA   Large left MCA infarct in setting of L MCA M1 occlusion, and numerous scattered small acute infarcts in the bilateral MCA, right PCA, and bilateral PICA territories, Consistent with embolic most likely secondary to either cardioembolic or hypercoagulable state from malignancy or chemo meds  TEE showed large PFO but no LE DVT  Loop recorder recommended but not put in - cardiology consult - they do not think patient is a candidate for a loop  recorder.  At this time, pt would not be a good candidate for aggressive anticoagulation with coumadin, she will need follow up with Dr. Merlene Laughter as outpt.   UTI  Many bateria in UA but WBC 0-2  Was on Rocephin -> Ceftin - last dose Saturday. Fever resolved - now afebrile  Culture  NGTD  May contribute to lowered seizure threshold   Malnutrition  Has PEG for poor oral intake, able to swallow at baseline Body mass index is 31.99 kg/(m^2).   Hypertension  Home meds:   Norvasc, coreg, clonidine  stable  Hyperlipidemia  Home meds:  mevacor 40 mg daily, resumed in hospital, though pt remains NPO awaiting seizure activity to stop prior to eval  LDL 72, goal < 70  Continue statin at discharge  Other Stroke Risk Factors  Advanced age  Obesity, Body mass index is 31.99 kg/(m^2).   Other Active Problems  Hypothyroid  CKD stage III  dpression  COPD  CHF, pulmonary HTN  Right elbow x-ray - 07/26/2015 - effusion. No fracture.  Cardiology consult obtained - no loop placement recommended at this time.   Follow-up 30 day event monitor previously performed which was negative as per family.  Mild hypokalemia - potassium is drifting down again - increase supplement. Check in a.m.  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  Dr. Farrel Gobble - on Revlimid, known to have risk of PNA and stroke   Hospital day # 6   Mikey Bussing [facility] Pager [phone] 07/30/2015, 1:43 PM  69 yo F with hx of multiple stroke, bedbound at home was admitted for seizure activity. EEG confirmed seizure from left hemisphere, but pt presented with bilateral shaking jerking and L>R. Put on dilantin, keppra, and vimpat. Seizure under control. MRI showed 2 punctate left MCA infarcts, concerning for embolic vs. Hypotension as pt has left M1 cut off. However, pt does not appear to be candidate for loop or  anticoagulation due to poor functional status and mRS = 4. Will continue medical management to stabilize pt and pt will follow up with Dr. Merlene Laughter. D/w daughter at bedside and answered questions.May reduce and stop keppra  . Antony Contras, MD   To contact Stroke Continuity provider, please refer to [email or web address]  After hours, contact General Neurology

## 2015-07-30 NOTE — Clinical Social Work Placement (Signed)
CLINICAL SOCIAL WORK PLACEMENT  NOTE  Date:  07/30/2015  Patient Details  Name: Melissa [name] MRN: [id number] Date of Birth: 1946-12-10  Clinical Social Work is seeking post-discharge placement for this patient at the Skilled  Nursing Facility level of care (*CSW will initial, date and re-position this form in  chart as items are completed):  Yes   Patient/family provided with [facility] Clinical Social Work Department's list of facilities offering this level of care within the geographic area requested by the patient (or if unable, by the patient's family).  Yes   Patient/family informed of their freedom to choose among providers that offer the needed level of care, that participate in Medicare, Medicaid or managed care program needed by the patient, have an available bed and are willing to accept the patient.  Yes   Patient/family informed of [facility]'s ownership interest in [facility] and [facility], as well as of the fact that they are under no obligation to receive care at these facilities.  PASRR submitted to EDS on       PASRR number received on       Existing PASRR number confirmed on 07/30/15     FL2 transmitted to all facilities in geographic area requested by pt/family on 07/30/15     FL2 transmitted to all facilities within larger geographic area on 07/30/15     Patient informed that his/her managed care company has contracts with or will negotiate with certain facilities, including the following:            Patient/family informed of bed offers received.  Patient chooses bed at       Physician recommends and patient chooses bed at      Patient to be transferred to   on  .  Patient to be transferred to facility by       Patient family notified on   of transfer.  Name of family member notified:        PHYSICIAN       Additional Comment:    _______________________________________________ Beverly Sessions, LCSW 07/30/2015, 2:24 PM

## 2015-07-30 NOTE — Clinical Social Work Note (Signed)
Clinical Social Work Assessment  Patient Details  Name: Melissa [name] MRN: [id number] Date of Birth: 07-01-1946  Date of referral:  07/28/15               Reason for consult:  Facility Placement                Permission sought to share information with:  Facility Sport and exercise psychologist, Family Supports Permission granted to share information::     Name::        Agency::     Relationship::     Contact Information:     Housing/Transportation Living arrangements for the past 2 months:  Single Family Home Source of Information:  Adult Children Patient Interpreter Needed:  None Criminal Activity/Legal Involvement Pertinent to Current Situation/Hospitalization:  No - Comment as needed Significant Relationships:  Adult Children Lives with:  Adult Children Do you feel safe going back to the place where you live?  Yes Need for family participation in patient care:  Yes (Comment)  Care giving concerns: Patient has been living with daughter in [place] since last stroke about 10 months ago. Patient will need additional rehabilitation for current recovery needs. Patient presented as non verbal with limited communication. CSW assessed with Daughter Jonelle Sidle. Patient was living with daughter Denman George.    Social Worker assessment / plan:  CSW provided daughter with list of facilities. Daughter stated that they were interested in [facility] and [facility]. Daughter was agreeable to facility search in [place] and [place].   Employment status:  Retired Nurse, adult PT Recommendations:  [place] / Referral to community resources:  [place]  Patient/Family's Response to care: Patient and fmaily looking forward to rehabilitation to return to previous level of functioning.   Patient/Family's Understanding of and Emotional Response to Diagnosis, Current Treatment, and Prognosis: Family presented as optomistic about  patient's progress.   Emotional Assessment Appearance:  Appears stated age Attitude/Demeanor/Rapport:  Unresponsive Affect (typically observed):  Pleasant Orientation:    Alcohol / Substance use:  Not Applicable Psych involvement (Current and /or in the community):  No (Comment)  Discharge Needs  Concerns to be addressed:  No discharge needs identified Readmission within the last 30 days:  No Current discharge risk:  None Barriers to Discharge:  No Barriers Identified   Kaeleb Emond, Daneil Dolin, LCSW 07/30/2015, 2:19 PM

## 2015-07-30 NOTE — Progress Notes (Signed)
Order for cam walker received, Case manager called to notify.

## 2015-07-30 NOTE — Progress Notes (Addendum)
[facility] PROGRESS NOTE  Melissa [name] AOZ:[id number] DOB: 1946-02-07 DOA: 07/24/2015 PCP: [name], MD HPI/Subjective: 69 y.o. 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 She presents to the emergency department with left-sided seizure. Per her primary caretaker Melissa [name], the patient has been feeling well recently aside from left hip and leg pain thought to be due to sciatica. This morning at approximately 8 AM Melissa [name] noticed her mother's left lower extremity twitching. Within about 30 minutes the twitching grew stronger and her left upper extremity began twitching as well. Her mother became unresponsive. And turned her face towards the right. After approximately an hour the twitching decreased in severity and her mother became a little bit more responsive. In the emergency department, UA is positive for many bacteria, CT head is negative for acute changes, chest x-ray shows atelectasis versus left lower lobe pneumonia. 8/7 awake, alert tracks you with her eyes occasionally attempts to speak, does not follow commands     Assessment/Plan: Seizure -ER physician spoke with neuro (Dr. Hosie Poisson) who recommended an MRI, starting the patient on Keppra, and outpatient follow-up.. Patient will be started on Keppra 500 mg twice a day.  -Seizure medication being titrated by stroke team  Acute stroke? -Stroke team not convinced patient had acute stroke.  Fever -Blood Culture x 2 -Urine Culture -CXR  Left lower lobe pneumonia(CAP) vs atelectasis Speech therapy consultation requested to recommend a safe diet for recommend nothing by mouth. Patient started on Levaquin.  Left hip pain -Thought to be sciatica. Patient started on Neurontin. Received no relief with  Neurontin. -Will x-ray left hip.   Chronic kidney disease -Creatinine at baseline  Hypertension -target BP is SBP 145-180 -Continue carvedilol, 6.25 mg BID -Clonidine patch 0.3 mg -lisinopril 5 mg daily -hydralazine IV 2 mg PRN SBP> 180   Hyperlipidemia -Continue pravastatin  Depression  -continue Cymbalta and Klonopin.  -Per pharmacy rec Prozac is being tapered off. We'll discontinue  Hypothyroidism -Continue Synthroid  Dysphagia -Continue PEG tube feedings with free water flushes. Nutrition consulted.  Rt Foot Drop -Cam Walker boot Rt foot      Code Status: DO NOT RESUSCITATE Family Communication: daughters Disposition Plan: SNF/per stroke team   Consultants:    Procedures: 8/1 CT head without contrast; -old infarct in left MCA territory.- No acute cortical infarction. No mass lesion - Stable old infarcts in right frontal lobe and right cerebellum. 8/1 MRI brain without contrast;Old infarction in the left MCA territory affecting the posterior temporal lobe and parietal region having progressed to atrophy/encephalomalacia and gliosis.  -Acute infarction representing extension of infarction in that region.  -2 punctate foci of acute infarction separate from that area in the left frontal lobe.    Cultures 8/1 urine negative 8/1 blood right arm/hand negative  Antibiotics: Levofloxacin 8/1>> stopped 8/2 Ceftriaxone 8/2>> stopped 8/5 Ceftin 8/6 x 2 doses    DVT prophylaxis     Objective: Filed Vitals:   07/30/15 1839 07/30/15 2211 07/31/15 0224 07/31/15 0637  BP: 178/82 146/90 188/88 167/86  Pulse: 90 75 95 93  Temp: 100.4 F (38 C) 98.1 F (36.7 C) 97.9 F (36.6 C) 97.6 F (36.4 C)  TempSrc: Axillary Axillary Axillary Axillary  Resp: 18 20 20 20   Height:      Weight:  SpO2: 100% 100% 100% 100%   No intake or output data in the 24 hours ending 07/31/15 0718 Filed Weights   07/28/15 0414 07/29/15 0700 07/30/15 4098  Weight: 90.311  kg (199 lb 1.6 oz) 89.54 kg (197 lb 6.4 oz) 89.858 kg (198 lb 1.6 oz)     Exam: General: awake, alert tracks you with her eyes occasionally attempts to speak, does not follow commands, No acute respiratory distress Eyes:negative scleral hemorrhage ENT: Negative Runny nose,negative gingival bleeding, Neck:  Negative scars, masses, torticollis, lymphadenopathy, JVD Lungs: Clear to auscultation bilaterally without wheezes or crackles Cardiovascular: Regular rate and rhythm without murmur gallop or rub normal S1 and S2 Abdomen:negative abdominal pain, negative dysphagia, nondistended, positive soft, bowel sounds, no rebound, no ascites, no appreciable mass Extremities: No significant cyanosis, clubbing, or edema bilateral lower extremities Psychiatric:  Unable to assess Neurologic:  Spontaneously moves RUE/RLE, tracks you with her eyes, attempts to speak, with draws R UE/R LE to painful stimuli.   Data Reviewed: Basic Metabolic Panel:  Recent Labs Lab 07/24/15 1506 07/25/15 0545 07/27/15 1110 07/28/15 0550 07/29/15 0930  NA 140 137 138 137 136  K 3.7 3.6 3.4* 3.9 3.3*  CL 105 103 105 102 104  CO2 29 28 29 27 26   GLUCOSE 83 82 133* 97 102*  BUN 10 12 17 13 11   CREATININE 0.69 0.86 0.77 0.67 0.65  CALCIUM 9.7 9.5 9.2 9.3 9.0  MG  --   --   --   --  1.9   Liver Function Tests:  Recent Labs Lab 07/25/15 0545 07/29/15 0930  AST 18 24  ALT 20 23  ALKPHOS 40 51  BILITOT 0.6 0.3  PROT 7.3 6.3*  ALBUMIN 2.8* 2.4*   No results for input(s): LIPASE, AMYLASE in the last 168 hours. No results for input(s): AMMONIA in the last 168 hours. CBC:  Recent Labs Lab 07/24/15 1506 07/27/15 1110 07/28/15 0550 07/29/15 0930  WBC 7.2 6.9 5.7 8.7  NEUTROABS  --   --   --  6.1  HGB 12.7 11.0* 11.3* 10.2*  HCT 36.3 31.7* 32.7* 29.9*  MCV 87.3 88.1 87.0 86.7  PLT 271 271 288 283   Cardiac Enzymes: No results for input(s): CKTOTAL, CKMB, CKMBINDEX, TROPONINI in the last 168  hours. BNP (last 3 results) No results for input(s): BNP in the last 8760 hours.  ProBNP (last 3 results)  Recent Labs  09/19/14 1004  PROBNP 1843.0*    CBG:  Recent Labs Lab 07/30/15 1110 07/30/15 1613 07/30/15 2040 07/31/15 0024 07/31/15 0416  GLUCAP 106* 133* 102* 127* 86    Recent Results (from the past 240 hour(s))  Culture, Urine     Status: None   Collection Time: 07/24/15  2:15 PM  Result Value Ref Range Status   Specimen Description URINE, RANDOM  Final   Special Requests CX ADDED AT 0215 ON 119147  Final   Culture NO GROWTH 1 DAY  Final   Report Status 07/26/2015 FINAL  Final  Culture, blood (routine x 2)     Status: None   Collection Time: 07/24/15  5:20 PM  Result Value Ref Range Status   Specimen Description BLOOD RIGHT ARM  Final   Special Requests BOTTLES DRAWN AEROBIC AND ANAEROBIC 10CC  Final   Culture NO GROWTH 5 DAYS  Final   Report Status 07/29/2015 FINAL  Final  Culture, blood (routine x 2)     Status: None   Collection Time: 07/24/15  5:25 PM  Result Value Ref Range Status   Specimen Description BLOOD RIGHT HAND  Final   Special Requests BOTTLES DRAWN AEROBIC ONLY 10CC  Final   Culture NO GROWTH 5 DAYS  Final   Report Status 07/29/2015 FINAL  Final     Studies: Dg Chest Port 1 View  07/30/2015   CLINICAL DATA:  Fever with pneumonia  EXAM: PORTABLE CHEST - 1 VIEW  COMPARISON:  July 24, 2015  FINDINGS: There is persistent airspace consolidation in the left lower lobe. There is atelectasis in the right base. Lungs elsewhere clear. Heart is mildly enlarged with pulmonary vascularity within normal limits. No adenopathy. There is degenerative change in the thoracic spine.  IMPRESSION: Persistent airspace consolidation left lower lobe. Patchy atelectasis right base is stable. No new opacity. Heart prominent but stable.   Electronically Signed   By: Bretta Bang III M.D.   On: 07/30/2015 21:29    Scheduled Meds: . carvedilol  6.25 mg Per Tube  BID WC  . cloNIDine  0.3 mg Transdermal Weekly  . clopidogrel  75 mg Oral Daily  . DULoxetine  30 mg Oral BID  . enoxaparin (LOVENOX) injection  40 mg Subcutaneous Q24H  . feeding supplement (OSMOLITE 1.5 CAL)  237 mL Per Tube 5 X Daily  . feeding supplement (PRO-STAT SUGAR FREE 64)  30 mL Per Tube BID  . free water  100 mL Per Tube 6 times per day  . lacosamide  100 mg Oral BID  . levETIRAcetam  1,000 mg Oral BID  . levothyroxine  75 mcg Per Tube QAC breakfast  . lisinopril  5 mg Oral Daily  . phenytoin  100 mg Oral TID  . potassium chloride  20 mEq Oral TID  . pravastatin  40 mg Oral q1800  . sodium chloride  3 mL Intravenous Q12H  . 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 If 7PM-7AM, please contact night-coverage at [email or web address], password [facility] 07/31/2015, 7:18 AM  LOS: 7 days    Care during the described time interval was provided by me .  I have reviewed this patient's available data, including medical history, events of note, physical examination, and all test results as part of my evaluation. I have personally reviewed and  interpreted all radiology studies.   Carolyne Littles, MD [phone] Pager

## 2015-07-31 ENCOUNTER — Other Ambulatory Visit (HOSPITAL_COMMUNITY): Payer: Medicare Other

## 2015-07-31 ENCOUNTER — Inpatient Hospital Stay (HOSPITAL_COMMUNITY): Payer: Medicare Other

## 2015-07-31 DIAGNOSIS — M21371 Foot drop, right foot: Secondary | ICD-10-CM | POA: Insufficient documentation

## 2015-07-31 DIAGNOSIS — R7881 Bacteremia: Secondary | ICD-10-CM

## 2015-07-31 DIAGNOSIS — B958 Unspecified staphylococcus as the cause of diseases classified elsewhere: Secondary | ICD-10-CM

## 2015-07-31 DIAGNOSIS — R509 Fever, unspecified: Secondary | ICD-10-CM | POA: Diagnosis present

## 2015-07-31 LAB — CBC WITH DIFFERENTIAL/PLATELET
BASOS ABS: 0 10*3/uL (ref 0.0–0.1)
Basophils Relative: 0 % (ref 0–1)
EOS PCT: 3 % (ref 0–5)
Eosinophils Absolute: 0.2 10*3/uL (ref 0.0–0.7)
HEMATOCRIT: 35.6 % — AB (ref 36.0–46.0)
HEMOGLOBIN: 12.4 g/dL (ref 12.0–15.0)
Lymphocytes Relative: 31 % (ref 12–46)
Lymphs Abs: 2.2 10*3/uL (ref 0.7–4.0)
MCH: 30.7 pg (ref 26.0–34.0)
MCHC: 34.8 g/dL (ref 30.0–36.0)
MCV: 88.1 fL (ref 78.0–100.0)
MONOS PCT: 7 % (ref 3–12)
Monocytes Absolute: 0.5 10*3/uL (ref 0.1–1.0)
NEUTROS PCT: 59 % (ref 43–77)
Neutro Abs: 4.3 10*3/uL (ref 1.7–7.7)
Platelets: 286 10*3/uL (ref 150–400)
RBC: 4.04 MIL/uL (ref 3.87–5.11)
RDW: 18 % — ABNORMAL HIGH (ref 11.5–15.5)
WBC: 7.2 10*3/uL (ref 4.0–10.5)

## 2015-07-31 LAB — URINALYSIS W MICROSCOPIC (NOT AT ARMC)
BILIRUBIN URINE: NEGATIVE
Glucose, UA: NEGATIVE mg/dL
HGB URINE DIPSTICK: NEGATIVE
Ketones, ur: NEGATIVE mg/dL
Leukocytes, UA: NEGATIVE
NITRITE: NEGATIVE
PH: 7.5 (ref 5.0–8.0)
Protein, ur: 30 mg/dL — AB
Specific Gravity, Urine: 1.007 (ref 1.005–1.030)
Urobilinogen, UA: 0.2 mg/dL (ref 0.0–1.0)

## 2015-07-31 LAB — COMPREHENSIVE METABOLIC PANEL
ALK PHOS: 55 U/L (ref 38–126)
ALT: 55 U/L — AB (ref 14–54)
AST: 57 U/L — ABNORMAL HIGH (ref 15–41)
Albumin: 2.8 g/dL — ABNORMAL LOW (ref 3.5–5.0)
Anion gap: 6 (ref 5–15)
BILIRUBIN TOTAL: 0.5 mg/dL (ref 0.3–1.2)
BUN: 10 mg/dL (ref 6–20)
CHLORIDE: 107 mmol/L (ref 101–111)
CO2: 27 mmol/L (ref 22–32)
Calcium: 9.8 mg/dL (ref 8.9–10.3)
Creatinine, Ser: 0.58 mg/dL (ref 0.44–1.00)
GFR calc Af Amer: >60 mL/min (ref >60)
GFR calc non Af Amer: >60 mL/min (ref >60)
GLUCOSE: 104 mg/dL — AB (ref 65–99)
Potassium: 4.2 mmol/L (ref 3.5–5.1)
SODIUM: 140 mmol/L (ref 135–145)
Total Protein: 7.7 g/dL (ref 6.5–8.1)

## 2015-07-31 LAB — C DIFFICILE QUICK SCREEN W PCR REFLEX
C DIFFICILE (CDIFF) TOXIN: POSITIVE — AB
C DIFFICLE (CDIFF) ANTIGEN: POSITIVE — AB
C Diff interpretation: POSITIVE

## 2015-07-31 LAB — GLUCOSE, CAPILLARY
GLUCOSE-CAPILLARY: 99 mg/dL (ref 65–99)
Glucose-Capillary: 127 mg/dL — ABNORMAL HIGH (ref 65–99)
Glucose-Capillary: 86 mg/dL (ref 65–99)

## 2015-07-31 LAB — MAGNESIUM: Magnesium: 1.9 mg/dL (ref 1.7–2.4)

## 2015-07-31 LAB — PHENYTOIN LEVEL, TOTAL: Phenytoin Lvl: 9.8 ug/mL — ABNORMAL LOW (ref 10.0–20.0)

## 2015-07-31 MED ORDER — PIPERACILLIN-TAZOBACTAM 3.375 G IVPB
3.3750 g | Freq: Three times a day (TID) | INTRAVENOUS | Status: DC
  Administered 2015-08-01 (×2): 3.375 g via INTRAVENOUS
  Filled 2015-07-31 (×6): qty 50

## 2015-07-31 MED ORDER — VANCOMYCIN HCL IN DEXTROSE 1-5 GM/200ML-% IV SOLN
1000.0000 mg | Freq: Two times a day (BID) | INTRAVENOUS | Status: DC
  Administered 2015-08-01 (×2): 1000 mg via INTRAVENOUS
  Filled 2015-07-31 (×4): qty 200

## 2015-07-31 MED ORDER — VANCOMYCIN 50 MG/ML ORAL SOLUTION
125.0000 mg | Freq: Four times a day (QID) | ORAL | Status: DC
  Administered 2015-08-01 – 2015-08-03 (×9): 125 mg
  Filled 2015-07-31 (×14): qty 2.5

## 2015-07-31 NOTE — Progress Notes (Signed)
[facility] PROGRESS NOTE  Melissa [name] FAO:[id number] DOB: 1946-10-21 DOA: 07/24/2015 PCP: [name], MD HPI/Subjective: 68 y.o. 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 She presents to the emergency department with left-sided seizure. Per her primary caretaker [name], the patient has been feeling well recently aside from left hip and leg pain thought to be due to sciatica. This morning at approximately 8 AM [name] noticed her mother's left lower extremity twitching. Within about 30 minutes the twitching grew stronger and her left upper extremity began twitching as well. Her mother became unresponsive. And turned her face towards the right. After approximately an hour the twitching decreased in severity and her mother became a little bit more responsive. In the emergency department, UA is positive for many bacteria, CT head is negative for acute changes, chest x-ray shows atelectasis versus left lower lobe pneumonia. 8/7 awake, alert tracks you with her eyes occasionally attempts to speak, does not follow commands 8/8  awake, alert tracks you with her eyes occasionally attempts to speak,, attempting to speak follow some commands    Assessment/Plan: Seizure -ER physician spoke with neuro (Dr. Hosie Poisson) who recommended an MRI, starting the patient on Keppra, and outpatient follow-up.. Patient will be started on Keppra 500 mg twice a day.  -Seizure medication being titrated by stroke team  Acute stroke? -Stroke team not convinced patient had acute stroke.  Bacteremia with Staphylococcus Fever -8/7 Blood Culture 1/2 positive GPC clusters; prophylactically start vancomycin until speciation and sensitivities -Urine Culture -CXR  Left lower lobe pneumonia(CAP) vs  atelectasis Speech therapy consultation requested to recommend a safe diet for recommend nothing by mouth. Patient started on Levaquin.  Left hip pain -Thought to be sciatica. Patient started on Neurontin. Received no relief with Neurontin. -Will x-ray left hip.   Chronic kidney disease -Creatinine at baseline  Hypertension -target BP is SBP 145-180 -Continue carvedilol, 6.25 mg BID -Clonidine patch 0.3 mg -lisinopril 5 mg daily -hydralazine IV 2 mg PRN SBP> 180   Hyperlipidemia -Continue pravastatin  Depression  -continue Cymbalta and Klonopin.  -Per pharmacy rec Prozac is being tapered off. We'll discontinue  Hypothyroidism -Continue Synthroid  Dysphagia -Continue PEG tube feedings with free water flushes. Nutrition consulted.  Rt Foot Drop -Cam Walker boot Rt foot      Code Status: DO NOT RESUSCITATE Family Communication: daughters Disposition Plan: SNF/per stroke team   Consultants:    Procedures: 8/1 CT head without contrast; -old infarct in left MCA territory.- No acute cortical infarction. No mass lesion - Stable old infarcts in right frontal lobe and right cerebellum. 8/1 MRI brain without contrast;Old infarction in the left MCA territory affecting the posterior temporal lobe and parietal region having progressed to atrophy/encephalomalacia and gliosis.  -Acute infarction representing extension of infarction in that region.  -2 punctate foci of acute infarction separate from that area in the left frontal lobe.    Cultures  8/1 urine negative 8/1 blood right arm/hand negative 8/7 blood 1/2 positive GPC in clusters  8/8 urine pending   Antibiotics: Levofloxacin 8/1>> stopped 8/2 Ceftriaxone 8/2>> stopped 8/5 Ceftin 8/6 x 2 doses Vancomycin 8/9>>    DVT prophylaxis     Objective: Filed Vitals:   07/31/15 2244 08/01/15 0206 08/01/15 0400 08/01/15 0612  BP: 154/85 183/90  157/81  Pulse:  92 93  80  Temp: 98.4 F (36.9 C) 98.2 F  (36.8 C)  98.2 F (36.8 C)  TempSrc: Oral Axillary  Axillary  Resp: 20 20  20   Height:      Weight:   87.408 kg (192 lb 11.2 oz)   SpO2: 76% 100%  100%   No intake or output data in the 24 hours ending 08/01/15 0745 Filed Weights   07/29/15 0700 07/30/15 0627 08/01/15 0400  Weight: 89.54 kg (197 lb 6.4 oz) 89.858 kg (198 lb 1.6 oz) 87.408 kg (192 lb 11.2 oz)     Exam: General: awake, alert tracks you with her eyes occasionally attempts to speak, follow some commands.  No acute respiratory distress Eyes:negative scleral hemorrhage ENT: Negative Runny nose,negative gingival bleeding, Neck:  Negative scars, masses, torticollis, lymphadenopathy, JVD Lungs: Clear to auscultation bilaterally without wheezes or crackles Cardiovascular: Regular rate and rhythm without murmur gallop or rub normal S1 and S2 Abdomen:negative abdominal pain, negative dysphagia, nondistended, positive soft, bowel sounds, no rebound, no ascites, no appreciable mass Extremities: No significant cyanosis, clubbing, or edema bilateral lower extremities Psychiatric:  Unable to assess Neurologic:  Spontaneously moves RUE/RLE, tracks you with her eyes, attempts to speak, with draws R UE/R LE to painful stimuli.   Data Reviewed: Basic Metabolic Panel:  Recent Labs Lab 07/27/15 1110 07/28/15 0550 07/29/15 0930 07/31/15 1102  NA 138 137 136 140  K 3.4* 3.9 3.3* 4.2  CL 105 102 104 107  CO2 29 27 26 27   GLUCOSE 133* 97 102* 104*  BUN 17 13 11 10   CREATININE 0.77 0.67 0.65 0.58  CALCIUM 9.2 9.3 9.0 9.8  MG  --   --  1.9 1.9   Liver Function Tests:  Recent Labs Lab 07/29/15 0930 07/31/15 1102  AST 24 57*  ALT 23 55*  ALKPHOS 51 55  BILITOT 0.3 0.5  PROT 6.3* 7.7  ALBUMIN 2.4* 2.8*   No results for input(s): LIPASE, AMYLASE in the last 168 hours. No results for input(s): AMMONIA in the last 168 hours. CBC:  Recent Labs Lab 07/27/15 1110 07/28/15 0550 07/29/15 0930 07/31/15 1102  WBC 6.9 5.7  8.7 7.2  NEUTROABS  --   --  6.1 4.3  HGB 11.0* 11.3* 10.2* 12.4  HCT 31.7* 32.7* 29.9* 35.6*  MCV 88.1 87.0 86.7 88.1  PLT 271 288 283 286   Cardiac Enzymes: No results for input(s): CKTOTAL, CKMB, CKMBINDEX, TROPONINI in the last 168 hours. BNP (last 3 results) No results for input(s): BNP in the last 8760 hours.  ProBNP (last 3 results)  Recent Labs  09/19/14 1004  PROBNP 1843.0*    CBG:  Recent Labs Lab 07/31/15 0024 07/31/15 0416 07/31/15 2008 08/01/15 0105 08/01/15 0355  GLUCAP 127* 86 99 96 88    Recent Results (from the past 240 hour(s))  Culture, Urine     Status: None   Collection Time: 07/24/15  2:15 PM  Result Value Ref Range Status   Specimen Description URINE, RANDOM  Final   Special Requests CX ADDED AT 0215 ON [id number]  Final   Culture NO GROWTH 1 DAY  Final   Report Status 07/26/2015 FINAL  Final  Culture, blood (routine x 2)     Status: None   Collection Time: 07/24/15  5:20 PM  Result Value Ref Range Status   Specimen Description BLOOD RIGHT ARM  Final   Special Requests BOTTLES DRAWN AEROBIC AND ANAEROBIC 10CC  Final   Culture NO GROWTH  5 DAYS  Final   Report Status 07/29/2015 FINAL  Final  Culture, blood (routine x 2)     Status: None   Collection Time: 07/24/15  5:25 PM  Result Value Ref Range Status   Specimen Description BLOOD RIGHT HAND  Final   Special Requests BOTTLES DRAWN AEROBIC ONLY 10CC  Final   Culture NO GROWTH 5 DAYS  Final   Report Status 07/29/2015 FINAL  Final  Culture, blood (routine x 2)     Status: None (Preliminary result)   Collection Time: 07/30/15  9:30 PM  Result Value Ref Range Status   Specimen Description BLOOD RIGHT HAND  Final   Special Requests BOTTLES DRAWN AEROBIC ONLY 5CC  Final   Culture  Setup Time   Final    GRAM POSITIVE COCCI IN CLUSTERS AEROBIC BOTTLE ONLY CRITICAL RESULT CALLED TO, READ BACK BY AND VERIFIED WITHSherran Needs RN [id number] 07/31/15 A BROWNING    Culture PENDING  Incomplete   Report Status  PENDING  Incomplete  C difficile quick scan w PCR reflex     Status: Abnormal   Collection Time: 07/31/15  8:57 PM  Result Value Ref Range Status   C Diff antigen POSITIVE (A) NEGATIVE Final   C Diff toxin POSITIVE (A) NEGATIVE Final   C Diff interpretation Positive for toxigenic C. difficile  Final    Comment: CRITICAL RESULT CALLED TO, READ BACK BY AND VERIFIED WITHSherran Needs RN [id number] 07/31/15 A BROWNING      Studies: Dg Chest Port 1 View  08/01/2015   CLINICAL DATA:  Bacteremia  EXAM: PORTABLE CHEST - 1 VIEW  COMPARISON:  July 30, 2015  FINDINGS: There is persistent left lower lobe consolidation. There is atelectatic change in the right base with possible superimposed pneumonia in this area. No new opacity. Heart is enlarged with pulmonary vascularity within normal limits. No adenopathy. There is degenerative change in the thoracic spine.  There is slight leftward deviation of the upper thoracic trachea.  IMPRESSION: Airspace consolidation in the left base. Atelectasis with possible associated airspace consolidation right base. Stable cardiomegaly. Question thyroid enlargement given mild leftward deviation of the thoracic trachea.   Electronically Signed   By: Bretta Bang III M.D.   On: 08/01/2015 01:10   Dg Chest Port 1 View  07/30/2015   CLINICAL DATA:  Fever with pneumonia  EXAM: PORTABLE CHEST - 1 VIEW  COMPARISON:  July 24, 2015  FINDINGS: There is persistent airspace consolidation in the left lower lobe. There is atelectasis in the right base. Lungs elsewhere clear. Heart is mildly enlarged with pulmonary vascularity within normal limits. No adenopathy. There is degenerative change in the thoracic spine.  IMPRESSION: Persistent airspace consolidation left lower lobe. Patchy atelectasis right base is stable. No new opacity. Heart prominent but stable.   Electronically Signed   By: Bretta Bang III M.D.   On: 07/30/2015 21:29    Scheduled Meds: . carvedilol  6.25 mg Per Tube BID WC   . cloNIDine  0.3 mg Transdermal Weekly  . clopidogrel  75 mg Oral Daily  . DULoxetine  30 mg Oral BID  . enoxaparin (LOVENOX) injection  40 mg Subcutaneous Q24H  . feeding supplement (OSMOLITE 1.5 CAL)  237 mL Per Tube 5 X Daily  . feeding supplement (PRO-STAT SUGAR FREE 64)  30 mL Per Tube BID  . free water  100 mL Per Tube 6 times per day  . lacosamide  100 mg Oral BID  .  levETIRAcetam  1,000 mg Oral BID  . levothyroxine  75 mcg Per Tube QAC breakfast  . lisinopril  5 mg Oral Daily  . phenytoin  100 mg Oral TID  . piperacillin-tazobactam (ZOSYN)  IV  3.375 g Intravenous 3 times per day  . potassium chloride  20 mEq Oral TID  . pravastatin  40 mg Oral q1800  . sodium chloride  3 mL Intravenous Q12H  . vancomycin  125 mg Per Tube QID  . vancomycin  1,000 mg Intravenous Q12H   Continuous Infusions:   Principal Problem:   Seizure Active Problems:   Hypothyroid   Obesity   Depression   VERTIGO, INTERMITTENT   Hyperlipidemia   Hypertension   History of kidney cancer   Anemia   CKD (chronic kidney disease) stage 3, GFR 30-59 ml/min   Chronic ischemic left MCA stroke   Left lower lobe pneumonia   Urinary tract infection   Left hip pain   Hemiplegia affecting right dominant side   Multiple myeloma   Acute ischemic stroke   Systolic dysfunction without heart failure   Partial seizure   Right elbow pain   Stroke   Acute cystitis without hematuria   CAP (community acquired pneumonia)   CKD (chronic kidney disease)   Essential hypertension   HLD (hyperlipidemia)   Other specified hypothyroidism   Dysphagia   Fever and chills   Right foot drop   Bacteremia due to Staphylococcus   Chronic kidney disease    Time spent: 40 min    WOODS, CURTIS J  [facility] Pager [phone]. If 7PM-7AM, please contact night-coverage at [email or web address], password [facility] 08/01/2015, 7:45 AM  LOS: 8 days    Care during the described time interval was provided by me .  I have  reviewed this patient's available data, including medical history, events of note, physical examination, and all test results as part of my evaluation. I have personally reviewed and interpreted all radiology studies.   Carolyne Littles, MD [phone] Pager

## 2015-07-31 NOTE — Progress Notes (Signed)
Physical Therapy Treatment Patient Details Name: Melissa [name] MRN: [id number] DOB: 07/17/1946 Today's Date: 07/31/2015    History of Present Illness 69 year old female with right-sided hemiparesis secondary to stroke, dysphagia s/p PEG, History of multiple myeloma, bedbound presented with twitching on her left leg followed by an unresponsive episode. Pt was seen in 2015, arousal was a barrier to swallowing. PEG was placed. MRI shows old infarction in the left MCA territory affecting the posterior temporal lobe and parietal region having progressed to atrophy, encephalomalacia and gliosis. Areas of acute infarction representing extension of infarction in that region. 2 punctate foci of acute infarction separate from that area in the left frontal lobe.      PT Comments    Patient tolerated EOB activities and OOB to chair with assist today. Patient with improvements in cervical and trunk control this session. Patient still appears to have discomfort in RUE (elbow) with movement. Patient was able to maintain static standing with 2 person max assist >5 seconds this session. Will continue to see and progress as tolerated.    Follow Up Recommendations  SNF;Supervision/Assistance - 24 hour     Equipment Recommendations  Other (comment) (hoyer lift)    Recommendations for Other Services       Precautions / Restrictions Precautions Precautions: Fall (peg tube in place, ) Precaution Comments: poor ability to control R ankle Restrictions Weight Bearing Restrictions: No    Mobility  Bed Mobility Overal bed mobility: Needs Assistance;+2 for physical assistance Bed Mobility: Rolling;Sidelying to Sit Rolling: Max assist;+2 for physical assistance Sidelying to sit: Total assist;+2 for physical assistance;HOB elevated       General bed mobility comments: Max assist for rolling and bed mobility. patient was able to use LUE to grip railing when rolling to the right. Assist for LEs  positioning, increased physical assist for trunk elevation to upright (total assist)  Transfers Overall transfer level: Needs assistance     Sit to Stand: Max assist;+2 physical assistance Stand pivot transfers: Max assist;+2 physical assistance       General transfer comment: Face to face transfer with chuck pad and gait belt 2 person assist  Ambulation/Gait                 Stairs            Wheelchair Mobility    Modified Rankin (Stroke Patients Only)       Balance Overall balance assessment: Needs assistance Sitting-balance support: Feet unsupported Sitting balance-Leahy Scale: Poor Sitting balance - Comments: min min assist for stability at EOB  Postural control: Right lateral lean (anterior lean at times)                          Cognition Arousal/Alertness: Awake/alert Behavior During Therapy: Flat affect Overall Cognitive Status: Difficult to assess                      Exercises      General Comments General comments (skin integrity, edema, etc.): Patient with urinary incontience at EOB, hygiene performed and pericare performed prior to EOB activities.       Pertinent Vitals/Pain Pain Assessment: Faces Faces Pain Scale: Hurts little more Pain Location: RUE elbow Pain Descriptors / Indicators: Grimacing;Moaning Pain Intervention(s): Limited activity within patient's tolerance;Repositioned;Monitored during session    Home Living                      Prior  Function            PT Goals (current goals can now be found in the care plan section) Acute Rehab PT Goals Patient Stated Goal: per daughter -to get back to single person level of care PT Goal Formulation: With family Time For Goal Achievement: 08/11/15 Potential to Achieve Goals: Fair Progress towards PT goals: Progressing toward goals    Frequency  Min 2X/week    PT Plan Current plan remains appropriate    Co-evaluation             End of  Session Equipment Utilized During Treatment: Gait belt Activity Tolerance: Patient tolerated treatment well Patient left: in chair;with call bell/phone within reach;with family/visitor present     Time: 4098-1191 PT Time Calculation (min) (ACUTE ONLY): 26 min  Charges:  $Therapeutic Activity: 23-37 mins                    G CodesFabio Asa 08/24/15, 11:21 AM Charlotte Crumb, PT DPT  [phone]

## 2015-07-31 NOTE — Progress Notes (Signed)
Speech Language Pathology Treatment: Dysphagia  Patient Details Name: Melissa [name] MRN: [id number] DOB: [birth year] Today's Date: 07/31/2015 Time: 1994-1290 SLP Time Calculation (min) (ACUTE ONLY): 22 min  Assessment / Plan / Recommendation Clinical Impression  Pt consumed puree/thin consistencies via 1/2 tsp or less amounts (x12) with mild delay in the initiation of the swallow noted and oral holding intermittently, but swallow has improved from initial BSE with moderate visual/auditory and tactile cues provided for swallow initiation; daughter informed SLP her Mother "had stopped eating by mouth about 2 months ago, except for small amounts intermittently and usually at breakfast meal."  Flat affect noted and no speech observed despite attempts by SLP and daughter for repetition and this is different from baseline.     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  Pt was seen in 2015, arousal was a barrier to swallowing. PEG was placed. Family reports to SLP that pt speaks a few words at a time, she also had a FEES at SNF prior to d/c home, consumes purees and thin liquids but pt has refused POs in recent past. She has not noticed coughing there have been no complaints of pain other than her left hip pain which has become severe in the past 2 weeks.  MRI shows old infarction in the left MCA territory affecting the posterior temporal lobe and parietal region having progressed to atrophy, encephalomalacia and gliosis. Areas of acute infarction representing extension of infarction in that region. 2 punctate foci of acute infarction separate from that area in the left frontal lobe.     Pertinent Vitals Pain Assessment: Faces Faces Pain Scale: Hurts little more Pain Location: left leg Pain Descriptors /  Indicators: Restless Pain Intervention(s): Monitored during session  SLP Plan  Continue with current plan of care    Recommendations Diet recommendations: Dysphagia 1 (puree);Other(comment) (pleasure feeding only) Medication Administration: Via alternative means Supervision: Full supervision/cueing for compensatory strategies Compensations: Slow rate;Small sips/bites              Oral Care Recommendations: Oral care QID Follow up Recommendations: Home health SLP Plan: Continue with current plan of care         Kuron Docken,PAT, M.S., CCC-SLP 07/31/2015, 2:47 PM

## 2015-07-31 NOTE — Progress Notes (Addendum)
ANTIBIOTIC CONSULT NOTE - INITIAL  Pharmacy Consult for Vancomycin/Zosyn  Indication: PNA, +blood cultures   Allergies  Allergen Reactions  . Morphine Nausea And Vomiting    Patient Measurements: Height: 5\' 6"  (167.6 cm) Weight: 198 lb 1.6 oz (89.858 kg) IBW/kg (Calculated) : 59.3  Vital Signs: Temp: 98.4 F (36.9 C) (08/08 2244) Temp Source: Oral (08/08 2244) BP: 154/85 mmHg (08/08 2244) Pulse Rate: 92 (08/08 2244)  Labs:  Recent Labs  07/29/15 0930 07/31/15 1102  WBC 8.7 7.2  HGB 10.2* 12.4  PLT 283 286  CREATININE 0.65 0.58   Estimated Creatinine Clearance: 74.9 mL/min (by C-G formula based on Cr of 0.58).   Medical History: Past Medical History  Diagnosis Date  . Vertigo, intermittent   . Hypothyroidism   . Hyperlipidemia   . Obesity   . Hypertension     severe and resistant to treatment; 2008-negative left renal angiogram  . Vitiligo   . Degenerative disc disease, lumbar   . Degenerative disc disease, cervical   . Chest pain 2008    2008-normal coronary angiography  . 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 Renal cell carcinoma     Right nephrectomy  . Multiple myeloma 08/26/2014   Assessment: CXR with persistent LLL consolidation, + blood culture, pt is febrile, noted pt C diff +, WBC WNL, renal function ok.   Plan:  -Vancomycin 1000 mg IV q12h -Zosyn 3.375G IV q8h to be infused over 4 hours -Trend WBC, temp, renal function  -F/U blood cultures  Narda Bonds 07/31/2015,11:50 PM

## 2015-07-31 NOTE — Progress Notes (Signed)
STROKE TEAM PROGRESS NOTE   HISTORY Ceairra Mccarver Mathews is an 69 y.o. 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 She presents to the emergency department with possible left-sided seizure. In ED she was found to have UTI and was admitted for treatment of UTI and ongoing jerking movements of the left arm and leg. patient received Ativan once while in ED which seemed to stop the movements per family. She was also loaded with 1000 mg Keppra and currently on 500 mg BID. The left arm and leg continues to have non-rhythmic jerking which could be myoclonus versus seizure. Neurology was asked to evaluate.   Patient was not considered for TPA secondary to non-stroke presentation, stroke not recognized.    SUBJECTIVE (INTERVAL HISTORY) Daughter at bedside.    OBJECTIVE Temp:  [97.6 F (36.4 C)-100.4 F (38 C)] 97.9 F (36.6 C) (08/08 1112) Pulse Rate:  [75-95] 91 (08/08 1112) Cardiac Rhythm:  [-]  Resp:  [18-20] 20 (08/08 1112) BP: (146-188)/(79-90) 163/85 mmHg (08/08 1112) SpO2:  [95 %-100 %] 95 % (08/08 1112)   Recent Labs Lab 07/30/15 1110 07/30/15 1613 07/30/15 2040 07/31/15 0024 07/31/15 0416  GLUCAP 106* 133* 102* 127* 86    Recent Labs Lab 07/24/15 1506 07/25/15 0545 07/27/15 1110 07/28/15 0550 07/29/15 0930  NA 140 137 138 137 136  K 3.7 3.6 3.4* 3.9 3.3*  CL 105 103 105 102 104  CO2 $Re'29 28 29 27 26  'tVM$ GLUCOSE 83 82 133* 97 102*  BUN $Re'10 12 17 13 11  'NNc$ CREATININE 0.69 0.86 0.77 0.67 0.65  CALCIUM 9.7 9.5 9.2 9.3 9.0  MG  --   --   --   --  1.9    Recent Labs Lab 07/25/15 0545 07/29/15 0930  AST 18 24  ALT 20 23  ALKPHOS 40 51  BILITOT 0.6 0.3  PROT 7.3 6.3*  ALBUMIN 2.8* 2.4*    Recent Labs Lab 07/24/15 1506 07/27/15 1110 07/28/15 0550 07/29/15 0930 07/31/15 1102  WBC 7.2 6.9 5.7 8.7 7.2   NEUTROABS  --   --   --  6.1 4.3  HGB 12.7 11.0* 11.3* 10.2* 12.4  HCT 36.3 31.7* 32.7* 29.9* 35.6*  MCV 87.3 88.1 87.0 86.7 88.1  PLT 271 271 288 283 286   No results for input(s): CKTOTAL, CKMB, CKMBINDEX, TROPONINI in the last 168 hours. No results for input(s): LABPROT, INR in the last 72 hours.  Recent Labs  07/31/15 0305  COLORURINE YELLOW  LABSPEC 1.007  PHURINE 7.5  GLUCOSEU NEGATIVE  HGBUR NEGATIVE  BILIRUBINUR NEGATIVE  KETONESUR NEGATIVE  PROTEINUR 30*  UROBILINOGEN 0.2  NITRITE NEGATIVE  LEUKOCYTESUR NEGATIVE       Component Value Date/Time   CHOL 140 04/03/2015 1122   TRIG 153* 04/03/2015 1122   HDL 37* 04/03/2015 1122   CHOLHDL 3.8 04/03/2015 1122   VLDL 31 04/03/2015 1122   LDLCALC 72 04/03/2015 1122   Lab Results  Component Value Date   HGBA1C 5.8* 04/03/2015   No results found for: LABOPIA, COCAINSCRNUR, LABBENZ, AMPHETMU, THCU, LABBARB  No results for input(s): ETH in the last 168 hours.   Dg Chest 1 View 07/24/2015   1. Left lower lobe atelectasis or pneumonia. 2. Mild right basilar atelectasis. 3. Stable cardiomegaly.     Ct Head Wo Contrast 07/24/2015    No  acute intracranial abnormality. Stable bilateral old infarcts as described above. Stable mild cerebral atrophy.     Mr Brain Wo Contrast 07/24/2015   Old infarction in the left MCA territory affecting the posterior temporal lobe and parietal region having progressed to atrophy, encephalomalacia and gliosis. Areas of acute infarction representing extension of infarction in that region. 2 punctate foci of acute infarction separate from that area in the left frontal lobe.  Extensive old infarctions elsewhere throughout the brain  Dr. Erlinda Hong discussed with Dr. Vernard Gambles regarding the DWI findings. They are not convinced that is definite acute infarct as it may represent laminar necrosis or mineral deposit in the previous infarct region and CT earlier has shown the changes.   Ct Head Wo  Contrast 09/18/2014 Large LEFT MCA territory infarct involving LEFT temporal and parietal lobes. No acute intracranial hemorrhage or midline shift. Old small high RIGHT frontal and questionable LEFT cerebellar infarcts.   Mr Brain Wo Contrast 09/19/2014 1. Large acute left MCA infarct. Cytotoxic edema and mass effect but no associated hemorrhage at this time. 2. Numerous scattered small acute infarcts in the bilateral MCA, right PCA, and bilateral PICA territories. Constellation most compatible with recent embolic event (cardiac or proximal aortic source).   Mr Jodene Nam Head Wo Contrast 09/19/2014 3. Left MCA occlusion in the M1 segment. Some reconstituted flow only in the anterior left MCA division. 4. No other major circle of Willis branch occlusion. Moderate irregularity of both ICA siphons.   Right Elbow 2 view x-ray 07/26/2015 No definite acute fracture or dislocation is observed.  There is a joint effusion.  Degenerative changes associated with the posterior aspect of the elbow joint are present.  There has not been significant interval change since the previous study.  TEE 09/23/14 - Intermittent large right to left shunt across a PFO, during quiet respiration - possible pathway for paradoxical embolism  Venous doppler LEs  09/23/14 - No evidence of deep vein or superficial thrombosis involving the right lower extremity and left lower extremity. - No evidence of Baker&'s cyst on the right or left.    Carotid Doppler  07/25/15 Bilateral: 1-39% ICA stenosis. Vertebral artery flow is antegrade on the right. The left vertebral artery flow could not be insonated due to involuntary movement of the body due to seizure type activity  2D Echocardiogram  07/25/15 - Left ventricle: The cavity size was mildly dilated. Wallthickness was normal. Systolic function was moderately reduced.The estimated ejection fraction was in the range of 35% to 40%.Diffuse hypokinesis. Doppler parameters are  consistent withabnormal left ventricular relaxation (grade 1 diastolicdysfunction). - Mitral valve: There was mild regurgitation. - Pulmonary arteries: Systolic pressure was moderately increased.PA peak pressure: 41 mm Hg (S).  EEG  07/25/15 1) Frequent left hemispheric seizures with a wide field 2) periodic lateralized epileptiform discharges associated with abnormal movements  Clinical Interpretation: This EEG is consistent with frequent seizures arising from the left hemisphere with a frontotemporal predominance.   07/26/15 1) Frequent left hemispheric seizures with a wide field 2) periodic lateralized epileptiform discharges associated with abnormal movements  Clinical Interpretation: This EEG is consistent with frequent seizures arising from the left hemisphere with a frontotemporal predominance. PLEDs can be ictal in nature, representing ongoing seizure activity.  07/27/15 - EEG Abnormalities: 1) periodic lateralized epileptiform discharges(PLEDs)  Clinical Interpretation: This EEG is most consistent with an area of irritability which could serve as a focus for seizures in the left hemisphere. There were no definite periods of evolution signifying definite seizure  on todays study. PLEDs are indeterminate and can be post-ictal or associated with an area of injury, but can be ictal in some circumstances.    PHYSICAL EXAM Temp:  [97.6 F (36.4 C)-100.4 F (38 C)] 97.9 F (36.6 C) (08/08 1112) Pulse Rate:  [75-95] 91 (08/08 1112) Resp:  [18-20] 20 (08/08 1112) BP: (146-188)/(79-90) 163/85 mmHg (08/08 1112) SpO2:  [95 %-100 %] 95 % (08/08 1112)  General - Well nourished, well developed, awake with eyes open, tracking to people, no language output, not following commands.  Ophthalmologic - Fundi not visualized due to noncooperation.  Cardiovascular - Regular rate and rhythm  Neuro - awake, eyes open, globally aphasic and  not able to follow any commands, no language output. PERRL,  visual neglect on the right side, blinking to visual threat on the left, left-sided eye gaze, right facial droop, hemiplegic on the right. RLE withdraw to pain but LUE and LLE spontaneous movement. No focal seizures noted   ASSESSMENT/PLAN Ms. Ericka Marcellus Traywick is a 69 y.o. female with history of multiple myeloma, right sided hemiparesis from previous left MCA large stroke, dysphagia status post PEG tube, who is bedbound, and speaks a few words at baseline presenting with seizures. She was not considered for IV t-PA due to non-stroke presentation.   Simple partial seizure - new onset  left hand and left foot jerking, spreading to right foot  EEG showed left-sided seizure activity with wide field  EEG 07/26/2015 and 07/25/15 - frequent seizure from left hemisphere  EEG 07/27/2015 - PLEDs, no seizure  Try to avoid benzo and intubation   Vimpat 100 bid added 07/27/2015 for continued LLE mild seizure activity.  Changed fosphenytoin to PO dilantin  Dilantin level 17.4 -> 18.5 -> 15.4 -> 12.5 Sunday (daily levels ordered)   Yesterday, decreased Keppra dose from 1500 mg twice daily to 1000 mg twice daily.   Patient doing bettter today. Nothing more to add from neuro standpoint. Will sign off. Call for questions. Follow up with DR. Doonquah as PTA  Stroke:  Chronic left posterior temporal/parietal infarct with laminar necrosis (doubt acute stroke, discussed with radiologist). Two punctate infarcts in L frontal areas. Infarcts felt to be embolic vs. Hypotension.   Resultant  baseline right hemiplegia  MRI  as above   MRA in 08/2014 showed right M1 cut off.   Carotid Doppler  No significant stenosis   2D Echo  EF 35-40% down from 08/2014 60-65% - monitor for signs or symptoms of congestive heart failure  TEE 09/2014 EF 65-70%, no SOE but large PFO  Venous doppler - No evidence of DVT or superficial thrombosis.  LDL 72  HgbA1c 5.8 in April  Lovenox 40 mg sq daily for VTE  prophylaxis    Tube feedings. ST swallow eval held d/t ongoing seizures  aspirin 162 mg orally every day prior to admission, now on plavix 75 mg orally every day  Therapy recommendations:  Skilled nursing facility recommended  Followed by Dr. Merlene Laughter PTA  Disposition:  The family has agreed to SNF placement per case manager's note.  Hx stroke/TIA   Large left MCA infarct in setting of L MCA M1 occlusion, and numerous scattered small acute infarcts in the bilateral MCA, right PCA, and bilateral PICA territories, Consistent with embolic most likely secondary to either cardioembolic or hypercoagulable state from malignancy or chemo meds  TEE showed large PFO but no LE DVT  Follow-up 30 day event monitor previously performed which was negative as per family.  Loop recorder recommended but not put in - cardiology consult - they do not think patient is a candidate for a loop recorder.  At this time, pt would not be a good candidate for aggressive anticoagulation with coumadin, she will need follow up with Dr. Merlene Laughter as outpt.   UTI  Many bateria in UA but WBC 0-2  Was on Rocephin -> Ceftin - last dose Saturday. Fever resolved - now afebrile  Culture NGTD  May contribute to lowered seizure threshold   Malnutrition  Has PEG for poor oral intake, able to swallow at baseline Body mass index is 31.99 kg/(m^2).   Hypertension  Home meds:   Norvasc, coreg, clonidine  stable  Hyperlipidemia  Home meds:  mevacor 40 mg daily, resumed in hospital, though pt remains NPO awaiting seizure activity to stop prior to eval  LDL 72, goal < 70  Continue statin at discharge  Other Stroke Risk Factors  Advanced age  Obesity, Body mass index is 31.99 kg/(m^2).   Malignancy (from hx)  history of renal cell carcinoma, status post right radical nephrectomy in December 2008 with no evidence of disease on CT scan done on 04/20/2014  IgG lambda multiple myeloma with Bence-Jones  proteinuria on systemic therapy with RVD, just ended first cycle of chemo, on a 7 day rest  MM on chemo but no staging info, but bone survey 04/2014 negative for lytic lesion.  Dr. Farrel Gobble - on Revlimid, known to have risk of PNA and stroke  Hospital day # [place] for Pager information 07/31/2015 2:29 PM  I have personally examined this patient, reviewed notes, independently viewed imaging studies, participated in medical decision making and plan of care. I have made any additions or clarifications directly to the above note. Agree with note above.   Antony Contras, MD Medical Director [facility] Pager: [phone] 07/31/2015 3:06 PM

## 2015-07-31 NOTE — Care Management Important Message (Signed)
Important Message  Patient Details  Name: Melissa [name] MRN: [id number] Date of Birth: October 01, 1946   Medicare Important Message Given:  Yes-third notification given    Delorse Lek 07/31/2015, 4:05 PM

## 2015-08-01 DIAGNOSIS — R7881 Bacteremia: Secondary | ICD-10-CM | POA: Diagnosis present

## 2015-08-01 DIAGNOSIS — N189 Chronic kidney disease, unspecified: Secondary | ICD-10-CM | POA: Diagnosis present

## 2015-08-01 DIAGNOSIS — A047 Enterocolitis due to Clostridium difficile: Secondary | ICD-10-CM

## 2015-08-01 LAB — GLUCOSE, CAPILLARY
GLUCOSE-CAPILLARY: 96 mg/dL (ref 65–99)
GLUCOSE-CAPILLARY: 142 mg/dL — AB (ref 65–99)
GLUCOSE-CAPILLARY: 88 mg/dL (ref 65–99)
Glucose-Capillary: 88 mg/dL (ref 65–99)
Glucose-Capillary: 134 mg/dL — ABNORMAL HIGH (ref 65–99)
Glucose-Capillary: 110 mg/dL — ABNORMAL HIGH (ref 65–99)

## 2015-08-01 LAB — PHENYTOIN LEVEL, TOTAL: PHENYTOIN LVL: 8.6 ug/mL — AB (ref 10.0–20.0)

## 2015-08-01 NOTE — Progress Notes (Signed)
[facility] PROGRESS NOTE  Melissa [name] VQQ:[id number] DOB: 1946/12/10 DOA: 07/24/2015 PCP: [name], MD HPI/Subjective: 69 y.o. 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 She presents to the emergency department with left-sided seizure. Per her primary caretaker [name], the patient has been feeling well recently aside from left hip and leg pain thought to be due to sciatica. This morning at approximately 8 AM [name] noticed her mother's left lower extremity twitching. Within about 30 minutes the twitching grew stronger and her left upper extremity began twitching as well. Her mother became unresponsive. And turned her face towards the right. After approximately an hour the twitching decreased in severity and her mother became a little bit more responsive. In the emergency department, UA is positive for many bacteria, CT head is negative for acute changes, chest x-ray shows atelectasis versus left lower lobe pneumonia. 8/7 awake, alert tracks you with her eyes occasionally attempts to speak, does not follow commands 8/8  awake, alert tracks you with her eyes occasionally attempts to speak,, attempting to speak follow some commands    Assessment/Plan: Seizure --MRI -Seizure medication being titrated by stroke team- keppra, vimpat, dilantin   Bacteremia with Staphylococcus Fever -8/7 Blood Culture 1/2 positive GPC clusters; prophylactically start vancomycin/zosyn -Urine Culture -CXR  c diff + PO vanc  Left lower lobe pneumonia(CAP) vs atelectasis -on vanc/zosyn   Chronic kidney disease -Creatinine at baseline  Hypertension -target BP is SBP 145-180 -Continue carvedilol, 6.25 mg BID -Clonidine patch 0.3 mg -lisinopril 5 mg daily -hydralazine IV 2 mg PRN  SBP> 180   Hyperlipidemia -Continue pravastatin  Depression  -continue Cymbalta and Klonopin.  -Per pharmacy rec Prozac is being tapered off. We'll discontinue  Hypothyroidism -Continue Synthroid  Dysphagia -Continue PEG tube feedings with free water flushes. Nutrition consulted.  Rt Foot Drop -Cam Walker boot Rt foot      Code Status: DO NOT RESUSCITATE Family Communication: daughters Disposition Plan: SNF   Consultants:    Procedures: 8/1 CT head without contrast; -old infarct in left MCA territory.- No acute cortical infarction. No mass lesion - Stable old infarcts in right frontal lobe and right cerebellum. 8/1 MRI brain without contrast;Old infarction in the left MCA territory affecting the posterior temporal lobe and parietal region having progressed to atrophy/encephalomalacia and gliosis.  -Acute infarction representing extension of infarction in that region.  -2 punctate foci of acute infarction separate from that area in the left frontal lobe.    Cultures  8/1 urine negative 8/1 blood right arm/hand negative 8/7 blood 1/2 positive GPC in clusters  8/8 urine pending      Objective: Filed Vitals:   08/01/15 0206 08/01/15 0400 08/01/15 0612 08/01/15 1218  BP: 183/90  157/81 161/82  Pulse: 93  80 78  Temp: 98.2 F (36.8 C)  98.2 F (36.8 C) 99.5 F (37.5 C)  TempSrc: Axillary  Axillary Oral  Resp: 20  20 20   Height:      Weight:  87.408 kg (192 lb 11.2 oz)    SpO2: 100%  100% 100%   No intake or output data in the 24 hours ending 08/01/15 1231 Filed Weights   07/29/15 0700 07/30/15 0627 08/01/15 0400  Weight: 89.54 kg (197 lb 6.4 oz) 89.858 kg (198 lb 1.6 oz) 87.408 kg (192 lb  11.2 oz)     Exam: General: awake, will make eye contact, not following commands Lungs: Clear to auscultation bilaterally without wheezes or crackles Cardiovascular: Regular rate and rhythm without murmur gallop or rub normal S1 and S2 Neurologic:  Spontaneously  moves RUE/RLE, tracks you with her eyes, attempts to speak, with draws R UE/R LE to painful stimuli.   Data Reviewed: Basic Metabolic Panel:  Recent Labs Lab 07/27/15 1110 07/28/15 0550 07/29/15 0930 07/31/15 1102  NA 138 137 136 140  K 3.4* 3.9 3.3* 4.2  CL 105 102 104 107  CO2 29 27 26 27   GLUCOSE 133* 97 102* 104*  BUN 17 13 11 10   CREATININE 0.77 0.67 0.65 0.58  CALCIUM 9.2 9.3 9.0 9.8  MG  --   --  1.9 1.9   Liver Function Tests:  Recent Labs Lab 07/29/15 0930 07/31/15 1102  AST 24 57*  ALT 23 55*  ALKPHOS 51 55  BILITOT 0.3 0.5  PROT 6.3* 7.7  ALBUMIN 2.4* 2.8*   No results for input(s): LIPASE, AMYLASE in the last 168 hours. No results for input(s): AMMONIA in the last 168 hours. CBC:  Recent Labs Lab 07/27/15 1110 07/28/15 0550 07/29/15 0930 07/31/15 1102  WBC 6.9 5.7 8.7 7.2  NEUTROABS  --   --  6.1 4.3  HGB 11.0* 11.3* 10.2* 12.4  HCT 31.7* 32.7* 29.9* 35.6*  MCV 88.1 87.0 86.7 88.1  PLT 271 288 283 286   Cardiac Enzymes: No results for input(s): CKTOTAL, CKMB, CKMBINDEX, TROPONINI in the last 168 hours. BNP (last 3 results) No results for input(s): BNP in the last 8760 hours.  ProBNP (last 3 results)  Recent Labs  09/19/14 1004  PROBNP 1843.0*    CBG:  Recent Labs Lab 07/31/15 2008 08/01/15 0105 08/01/15 0355 08/01/15 0756 08/01/15 1155  GLUCAP 99 96 88 142* 134*    Recent Results (from the past 240 hour(s))  Culture, Urine     Status: None   Collection Time: 07/24/15  2:15 PM  Result Value Ref Range Status   Specimen Description URINE, RANDOM  Final   Special Requests CX ADDED AT 0215 ON [id number]  Final   Culture NO GROWTH 1 DAY  Final   Report Status 07/26/2015 FINAL  Final  Culture, blood (routine x 2)     Status: None   Collection Time: 07/24/15  5:20 PM  Result Value Ref Range Status   Specimen Description BLOOD RIGHT ARM  Final   Special Requests BOTTLES DRAWN AEROBIC AND ANAEROBIC 10CC  Final   Culture NO GROWTH  5 DAYS  Final   Report Status 07/29/2015 FINAL  Final  Culture, blood (routine x 2)     Status: None   Collection Time: 07/24/15  5:25 PM  Result Value Ref Range Status   Specimen Description BLOOD RIGHT HAND  Final   Special Requests BOTTLES DRAWN AEROBIC ONLY 10CC  Final   Culture NO GROWTH 5 DAYS  Final   Report Status 07/29/2015 FINAL  Final  Culture, blood (routine x 2)     Status: None (Preliminary result)   Collection Time: 07/30/15  9:30 PM  Result Value Ref Range Status   Specimen Description BLOOD RIGHT HAND  Final   Special Requests BOTTLES DRAWN AEROBIC ONLY 5CC  Final   Culture  Setup Time   Final    GRAM POSITIVE COCCI IN CLUSTERS AEROBIC BOTTLE ONLY CRITICAL RESULT CALLED TO, READ BACK BY AND VERIFIED WITH: Sherran Needs RN  2126 07/31/15 A BROWNING    Culture PENDING  Incomplete   Report Status PENDING  Incomplete  C difficile quick scan w PCR reflex     Status: Abnormal   Collection Time: 07/31/15  8:57 PM  Result Value Ref Range Status   C Diff antigen POSITIVE (A) NEGATIVE Final   C Diff toxin POSITIVE (A) NEGATIVE Final   C Diff interpretation Positive for toxigenic C. difficile  Final    Comment: CRITICAL RESULT CALLED TO, READ BACK BY AND VERIFIED WITHSherran Needs RN [id number] 07/31/15 A BROWNING      Studies: Dg Chest Port 1 View  08/01/2015   CLINICAL DATA:  Bacteremia  EXAM: PORTABLE CHEST - 1 VIEW  COMPARISON:  July 30, 2015  FINDINGS: There is persistent left lower lobe consolidation. There is atelectatic change in the right base with possible superimposed pneumonia in this area. No new opacity. Heart is enlarged with pulmonary vascularity within normal limits. No adenopathy. There is degenerative change in the thoracic spine.  There is slight leftward deviation of the upper thoracic trachea.  IMPRESSION: Airspace consolidation in the left base. Atelectasis with possible associated airspace consolidation right base. Stable cardiomegaly. Question thyroid enlargement given  mild leftward deviation of the thoracic trachea.   Electronically Signed   By: Bretta Bang III M.D.   On: 08/01/2015 01:10   Dg Chest Port 1 View  07/30/2015   CLINICAL DATA:  Fever with pneumonia  EXAM: PORTABLE CHEST - 1 VIEW  COMPARISON:  July 24, 2015  FINDINGS: There is persistent airspace consolidation in the left lower lobe. There is atelectasis in the right base. Lungs elsewhere clear. Heart is mildly enlarged with pulmonary vascularity within normal limits. No adenopathy. There is degenerative change in the thoracic spine.  IMPRESSION: Persistent airspace consolidation left lower lobe. Patchy atelectasis right base is stable. No new opacity. Heart prominent but stable.   Electronically Signed   By: Bretta Bang III M.D.   On: 07/30/2015 21:29    Scheduled Meds: . carvedilol  6.25 mg Per Tube BID WC  . cloNIDine  0.3 mg Transdermal Weekly  . clopidogrel  75 mg Oral Daily  . DULoxetine  30 mg Oral BID  . enoxaparin (LOVENOX) injection  40 mg Subcutaneous Q24H  . feeding supplement (OSMOLITE 1.5 CAL)  237 mL Per Tube 5 X Daily  . feeding supplement (PRO-STAT SUGAR FREE 64)  30 mL Per Tube BID  . free water  100 mL Per Tube 6 times per day  . lacosamide  100 mg Oral BID  . levETIRAcetam  1,000 mg Oral BID  . levothyroxine  75 mcg Per Tube QAC breakfast  . lisinopril  5 mg Oral Daily  . phenytoin  100 mg Oral TID  . piperacillin-tazobactam (ZOSYN)  IV  3.375 g Intravenous 3 times per day  . potassium chloride  20 mEq Oral TID  . pravastatin  40 mg Oral q1800  . sodium chloride  3 mL Intravenous Q12H  . vancomycin  125 mg Per Tube QID  . vancomycin  1,000 mg Intravenous Q12H   Continuous Infusions:   Principal Problem:   Seizure Active Problems:   Hypothyroid   Obesity   Depression   VERTIGO, INTERMITTENT   Hyperlipidemia   Hypertension   History of kidney cancer   Anemia   CKD (chronic kidney disease) stage 3, GFR 30-59 ml/min   Chronic ischemic left MCA  stroke   Left lower lobe pneumonia  Urinary tract infection   Left hip pain   Hemiplegia affecting right dominant side   Multiple myeloma   Acute ischemic stroke   Systolic dysfunction without heart failure   Partial seizure   Right elbow pain   Stroke   Acute cystitis without hematuria   CAP (community acquired pneumonia)   CKD (chronic kidney disease)   Essential hypertension   HLD (hyperlipidemia)   Other specified hypothyroidism   Dysphagia   Fever and chills   Right foot drop   Bacteremia due to Staphylococcus   Chronic kidney disease    Time spent: 25 min    Mitali Shenefield  [facility] Pager [phone]. If 7PM-7AM, please contact night-coverage at [email or web address], password [facility] 08/01/2015, 12:31 PM  LOS: 8 days

## 2015-08-01 NOTE — Progress Notes (Signed)
Orthopedic Tech Progress Note Patient Details:  Melissa [name] 03/20/1946 [id number]  Ortho Devices Type of Ortho Device: CAM walker Ortho Device/Splint Interventions: Application   [name], Melissa [name] 08/01/2015, 2:28 PM

## 2015-08-01 NOTE — Clinical Social Work Note (Signed)
Patient positive for C-DIFF and not medically stable for discharge. Signed FL-2 and DNR form on chart. Patient's family has NOT decided on which SNF pt will transfer to.   CSW will continue to follow pt and pt's family for continued support and to facilitate pt's discharge needs once medically stable.   Glendon Axe, MSW, LCSWA [phone] 08/01/2015 1:42 PM

## 2015-08-01 NOTE — Progress Notes (Signed)
Speech Language Pathology Treatment: Dysphagia  Patient Details Name: Melissa [name] MRN: [id number] DOB: Nov 12, 1946 Today's Date: 08/01/2015 Time: 0925-0950 SLP Time Calculation (min) (ACUTE ONLY): 25 min  Assessment / Plan / Recommendation Clinical Impression  Pt today with improved mentation than during SLP initial evaluation last Thursday.  Daughter present reports pt yesterday sang a few words but otherwise will frequently not answer yes/no questions via voicing or head nodding.  Intake of floor stock has been minimal per daughter, and daughter questions gustatory changes.  Pt did attempt to voice x1 but articulation was not intelligible.  SLP attempted automatic task of counting with pt without pt attempts to repeat despite total cues.   Provided pt with cranberry juice via tsp, cup and yogurt via tsp.  Intermittent delayed oral transiting - more pronounced with puree than liquids with multiple swallows.  No indication of airway compromise.  At this time, recommend continue floor stock po only due to ongoing level of oral holding.  Please assure follow up SLP at SNF to maximize swallow rehabilitation.    Educated daughter to plan and she was agreeable.  Informed daughter and pt to continue po of puree/thin for pleasure and to decrease disuse muscle atrophy.  Per daughter, pt may dc to SNF tomorrow, no further acute SLP indicated as plan of care in place.     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  Pt was seen in 2015, arousal was a barrier to swallowing. PEG was placed. Family reports to SLP that pt speaks a few words at a time, she also had a FEES at SNF prior to d/c home, consumes purees and thin liquids but pt has refused POs in recent past. She has not noticed coughing there have been no  complaints of pain other than her left hip pain which has become severe in the past 2 weeks.  MRI shows old infarction in the left MCA territory affecting the posterior temporal lobe and parietal region having progressed to atrophy, encephalomalacia and gliosis. Areas of acute infarction representing extension of infarction in that region. 2 punctate foci of acute infarction separate from that area in the left frontal lobe.     Pertinent Vitals Pain Assessment: Faces Faces Pain Scale: No hurt  SLP Plan  Continue with current plan of care    Recommendations Diet recommendations: Dysphagia 1 (puree);Thin liquid (continued pleasure feeds only from floor stock) Liquids provided via: Cup;Teaspoon Medication Administration: Via alternative means Supervision: Full supervision/cueing for compensatory strategies Compensations: Slow rate;Small sips/bites (allow time for delayed swallows, cue pt to swallow prn, oral suction prn)              Oral Care Recommendations: Oral care QID Follow up Recommendations: Skilled Nursing facility (per daughter, pt is to dc to [facility]) Plan: Continue with current plan of care    [place], [place] [facility] SLP [phone]

## 2015-08-01 NOTE — Progress Notes (Signed)
Nutrition Follow-up  INTERVENTION:  Continue providing 1 can (237ml) of Osmolite 1.5 via PEG 5 times daily with 30 ml Prostat via PEG BID.   Tube feeding regimen provides 1978 kcal (100% of needs), 104 grams of protein, and 900 ml of H2O.   Continue 100 ml free water flushes every 4 hours to provide an additional 600 ml of fluid daily   NUTRITION DIAGNOSIS:   Inadequate oral intake related to dysphagia as evidenced by NPO status.  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   Pt was advanced to Dysphagia 1 diet on 8/4; continued pleasure feeds from floor stock only per SLP note. Pt continues to receive bolus feeds of Osmoilte 1.5 via PEG 5 times daily. Pt is tolerating feeds. Weight remains stable at >/=190 lbs.   Labs reviewed.   Diet Order:    Dysphagia 1/thin liquids- floor stock foods for pleasure only  Skin:  Wound (see comment) (healed discolored area on coccyx)  Last BM:  8/8  Height:   Ht Readings from Last 1 Encounters:  07/24/15 5' 6" (1.676 m)    Weight:   Wt Readings from Last 1 Encounters:  08/01/15 192 lb 11.2 oz (87.408 kg)    Ideal Body Weight:  59 kg  BMI:  Body mass index is 31.12 kg/(m^2).  Estimated Nutritional Needs:   Kcal:  1750-2000  Protein:  100-120 grams  Fluid:  1.8-2 L/day  EDUCATION NEEDS:   No education needs identified at this time    RD, LDN Inpatient Clinical Dietitian Pager: [phone] After Hours Pager: [phone]  

## 2015-08-01 NOTE — Progress Notes (Signed)
Paged [facility] about no IV access per IV team with two attempts. Needed IV access for IV Vancomycin for positive blood cultures. MD said will reassess in the morning for possible PICC. Ruben Gottron, [place] 08/01/[phone]

## 2015-08-01 NOTE — Progress Notes (Signed)
STROKE TEAM PROGRESS NOTE   HISTORY Melissa [name] is an 69 y.o. 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 She presents to the emergency department with possible left-sided seizure. In ED she was found to have UTI and was admitted for treatment of UTI and ongoing jerking movements of the left arm and leg. patient received Ativan once while in ED which seemed to stop the movements per family. She was also loaded with 1000 mg Keppra and currently on 500 mg BID. The left arm and leg continues to have non-rhythmic jerking which could be myoclonus versus seizure. Neurology was asked to evaluate.   Patient was not considered for TPA secondary to non-stroke presentation, stroke not recognized.    SUBJECTIVE (INTERVAL HISTORY) Daughter at bedside. Patient is stable without any seizures. She remains aphasic. Await transfer to [facility] in bed available   OBJECTIVE Temp:  [98.1 F (36.7 C)-99.5 F (37.5 C)] 99.5 F (37.5 C) (08/09 1218) Pulse Rate:  [78-93] 78 (08/09 1218) Cardiac Rhythm:  [-]  Resp:  [20] 20 (08/09 1218) BP: (154-183)/(80-90) 161/82 mmHg (08/09 1218) SpO2:  [76 %-100 %] 100 % (08/09 1218) Weight:  [192 lb 11.2 oz (87.408 kg)] 192 lb 11.2 oz (87.408 kg) (08/09 0400)   Recent Labs Lab 07/31/15 2008 08/01/15 0105 08/01/15 0355 08/01/15 0756 08/01/15 1155  GLUCAP 99 96 88 142* 134*    Recent Labs Lab 07/27/15 1110 07/28/15 0550 07/29/15 0930 07/31/15 1102  NA 138 137 136 140  K 3.4* 3.9 3.3* 4.2  CL 105 102 104 107  CO2 _0 GLUCOSE 133* 97 102* 104*  BUN _1 CREATININE 0.77 0.67 0.65 0.58  CALCIUM 9.2 9.3 9.0 9.8  MG  --   --  1.9 1.9    Recent Labs Lab 07/29/15 0930 07/31/15 1102  AST 24 57*  ALT 23 55*  ALKPHOS 51 55  BILITOT 0.3 0.5  PROT 6.3* 7.7  ALBUMIN 2.4* 2.8*     Recent Labs Lab 07/27/15 1110 07/28/15 0550 07/29/15 0930 07/31/15 1102  WBC 6.9 5.7 8.7 7.2  NEUTROABS  --   --  6.1 4.3  HGB 11.0* 11.3* 10.2* 12.4  HCT 31.7* 32.7* 29.9* 35.6*  MCV 88.1 87.0 86.7 88.1  PLT 271 288 283 286   No results for input(s): CKTOTAL, CKMB, CKMBINDEX, TROPONINI in the last 168 hours. No results for input(s): LABPROT, INR in the last 72 hours.  Recent Labs  07/31/15 0305  COLORURINE YELLOW  LABSPEC 1.007  PHURINE 7.5  GLUCOSEU NEGATIVE  HGBUR NEGATIVE  BILIRUBINUR NEGATIVE  KETONESUR NEGATIVE  PROTEINUR 30*  UROBILINOGEN 0.2  NITRITE NEGATIVE  LEUKOCYTESUR NEGATIVE       Component Value Date/Time   CHOL 140 04/03/2015 1122   TRIG 153* 04/03/2015 1122   HDL 37* 04/03/2015 1122   CHOLHDL 3.8 04/03/2015 1122   VLDL 31 04/03/2015 1122   LDLCALC 72 04/03/2015 1122   Lab Results  Component Value Date   HGBA1C 5.8* 04/03/2015   No results found for: LABOPIA, COCAINSCRNUR, LABBENZ, AMPHETMU, THCU, LABBARB  No results for input(s): ETH in the last 168 hours.   Dg Chest 1 View 07/24/2015   1. Left lower lobe atelectasis or pneumonia. 2. Mild right basilar atelectasis. 3. Stable cardiomegaly.     Ct Head Wo  Contrast 07/24/2015    No acute intracranial abnormality. Stable bilateral old infarcts as described above. Stable mild cerebral atrophy.     Mr Brain Wo Contrast 07/24/2015   Old infarction in the left MCA territory affecting the posterior temporal lobe and parietal region having progressed to atrophy, encephalomalacia and gliosis. Areas of acute infarction representing extension of infarction in that region. 2 punctate foci of acute infarction separate from that area in the left frontal lobe.  Extensive old infarctions elsewhere throughout the brain  Dr. Erlinda Hong discussed with Dr. Vernard Gambles regarding the DWI findings. They are not convinced that is definite acute infarct as it may represent laminar necrosis or mineral deposit in the previous infarct  region and CT earlier has shown the changes.   Ct Head Wo Contrast 09/18/2014 Large LEFT MCA territory infarct involving LEFT temporal and parietal lobes. No acute intracranial hemorrhage or midline shift. Old small high RIGHT frontal and questionable LEFT cerebellar infarcts.   Mr Brain Wo Contrast 09/19/2014 1. Large acute left MCA infarct. Cytotoxic edema and mass effect but no associated hemorrhage at this time. 2. Numerous scattered small acute infarcts in the bilateral MCA, right PCA, and bilateral PICA territories. Constellation most compatible with recent embolic event (cardiac or proximal aortic source).   Mr Jodene Nam Head Wo Contrast 09/19/2014 3. Left MCA occlusion in the M1 segment. Some reconstituted flow only in the anterior left MCA division. 4. No other major circle of Willis branch occlusion. Moderate irregularity of both ICA siphons.   Right Elbow 2 view x-ray 07/26/2015 No definite acute fracture or dislocation is observed.  There is a joint effusion.  Degenerative changes associated with the posterior aspect of the elbow joint are present.  There has not been significant interval change since the previous study.  TEE 09/23/14 - Intermittent large right to left shunt across a PFO, during quiet respiration - possible pathway for paradoxical embolism  Venous doppler LEs  09/23/14 - No evidence of deep vein or superficial thrombosis involving the right lower extremity and left lower extremity. - No evidence of Baker&'s cyst on the right or left.    Carotid Doppler  07/25/15 Bilateral: 1-39% ICA stenosis. Vertebral artery flow is antegrade on the right. The left vertebral artery flow could not be insonated due to involuntary movement of the body due to seizure type activity  2D Echocardiogram  07/25/15 - Left ventricle: The cavity size was mildly dilated. Wallthickness was normal. Systolic function was moderately reduced.The estimated ejection fraction was in the range of  35% to 40%.Diffuse hypokinesis. Doppler parameters are consistent withabnormal left ventricular relaxation (grade 1 diastolicdysfunction). - Mitral valve: There was mild regurgitation. - Pulmonary arteries: Systolic pressure was moderately increased.PA peak pressure: 41 mm Hg (S).  EEG  07/25/15 1) Frequent left hemispheric seizures with a wide field 2) periodic lateralized epileptiform discharges associated with abnormal movements  Clinical Interpretation: This EEG is consistent with frequent seizures arising from the left hemisphere with a frontotemporal predominance.   07/26/15 1) Frequent left hemispheric seizures with a wide field 2) periodic lateralized epileptiform discharges associated with abnormal movements  Clinical Interpretation: This EEG is consistent with frequent seizures arising from the left hemisphere with a frontotemporal predominance. PLEDs can be ictal in nature, representing ongoing seizure activity.  07/27/15 - EEG Abnormalities: 1) periodic lateralized epileptiform discharges(PLEDs)  Clinical Interpretation: This EEG is most consistent with an area of irritability which could serve as a focus for seizures in the left hemisphere. There were no definite  periods of evolution signifying definite seizure on todays study. PLEDs are indeterminate and can be post-ictal or associated with an area of injury, but can be ictal in some circumstances.    PHYSICAL EXAM Temp:  [98.1 F (36.7 C)-99.5 F (37.5 C)] 99.5 F (37.5 C) (08/09 1218) Pulse Rate:  [78-93] 78 (08/09 1218) Resp:  [20] 20 (08/09 1218) BP: (154-183)/(80-90) 161/82 mmHg (08/09 1218) SpO2:  [76 %-100 %] 100 % (08/09 1218) Weight:  [192 lb 11.2 oz (87.408 kg)] 192 lb 11.2 oz (87.408 kg) (08/09 0400)  General - Well nourished, well developed, awake with eyes open, tracking to people, no language output, not following commands.  Ophthalmologic - Fundi not visualized due to noncooperation.  Cardiovascular -  Regular rate and rhythm  Neuro - awake, eyes open, globally aphasic and  not able to follow any commands, no language output. PERRL, visual neglect on the right side, blinking to visual threat on the left, left-sided eye gaze, right facial droop, hemiplegic on the right. RLE withdraw to pain but LUE and LLE spontaneous movement. No focal seizures noted   ASSESSMENT/PLAN Ms. Clora Ohmer Mini is a 69 y.o. female with history of multiple myeloma, right sided hemiparesis from previous left MCA large stroke, dysphagia status post PEG tube, who is bedbound, and speaks a few words at baseline presenting with seizures. She was not considered for IV t-PA due to non-stroke presentation.   Simple partial seizure - new onset  left hand and left foot jerking, spreading to right foot  EEG showed left-sided seizure activity with wide field  EEG 07/26/2015 and 07/25/15 - frequent seizure from left hemisphere  EEG 07/27/2015 - PLEDs, no seizure  Try to avoid benzo and intubation   Vimpat 100 bid added 07/27/2015 for continued LLE mild seizure activity.  Changed fosphenytoin to PO dilantin  Dilantin level 17.4 -> 18.5 -> 15.4 -> 12.5 Sunday (daily levels ordered)   Yesterday, decreased Keppra dose from 1500 mg twice daily to 1000 mg twice daily.   Patient doing bettter today. Nothing more to add from neuro standpoint. Will sign off. Call for questions. Follow up with DR. [place]on[place] as PTA  Stroke:  Chronic left posterior temporal/parietal infarct with laminar necrosis (doubt acute stroke, discussed with radiologist). Two punctate infarcts in L frontal areas. Infarcts felt to be embolic vs. Hypotension.   Resultant  baseline right hemiplegia  MRI  as above   MRA in 08/2014 showed right M1 cut off.   Carotid Doppler  No significant stenosis   2D Echo  EF 35-40% down from 08/2014 60-65% - monitor for signs or symptoms of congestive heart failure  TEE 09/2014 EF 65-70%, no SOE but large PFO  Venous  doppler - No evidence of DVT or superficial thrombosis.  LDL 72  HgbA1c 5.8 in April  Lovenox 40 mg sq daily for VTE prophylaxis    Tube feedings. ST swallow eval held d/t ongoing seizures  aspirin 162 mg orally every day prior to admission, now on plavix 75 mg orally every day  Therapy recommendations:  Skilled nursing facility recommended  Followed by Dr. Merlene Laughter PTA  Disposition:  The family has agreed to SNF placement per case manager's note.  Hx stroke/TIA   Large left MCA infarct in setting of L MCA M1 occlusion, and numerous scattered small acute infarcts in the bilateral MCA, right PCA, and bilateral PICA territories, Consistent with embolic most likely secondary to either cardioembolic or hypercoagulable state from malignancy or chemo meds  TEE showed large PFO but no LE DVT  Follow-up 30 day event monitor previously performed which was negative as per family.  Loop recorder recommended but not put in - cardiology consult - they do not think patient is a candidate for a loop recorder.  At this time, pt would not be a good candidate for aggressive anticoagulation with coumadin, she will need follow up with Dr. Merlene Laughter as outpt.   UTI  Many bateria in UA but WBC 0-2  Was on Rocephin -> Ceftin - last dose Saturday. Fever resolved - now afebrile  Culture NGTD  May contribute to lowered seizure threshold   Malnutrition  Has PEG for poor oral intake, able to swallow at baseline Body mass index is 31.12 kg/(m^2).   Hypertension  Home meds:   Norvasc, coreg, clonidine  stable  Hyperlipidemia  Home meds:  mevacor 40 mg daily, resumed in hospital, though pt remains NPO awaiting seizure activity to stop prior to eval  LDL 72, goal < 70  Continue statin at discharge  Other Stroke Risk Factors  Advanced age  Obesity, Body mass index is 31.12 kg/(m^2).   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  Dr. Farrel Gobble - on Revlimid, known to have risk of PNA and stroke  Hospital day # 8     I have personally examined this patient, reviewed notes, independently viewed imaging studies, participated in medical decision making and plan of care. I have made any additions or clarifications directly to the above note. Stroke team will sign off. Follow-up as an outpatient with Dr. Merlene Laughter patient's primary neurologist. Kindly call for questions  Antony Contras, MD Medical Director [place] Pager: [phone] 08/01/2015 1:01 PM

## 2015-08-02 LAB — COMPREHENSIVE METABOLIC PANEL
ALBUMIN: 3 g/dL — AB (ref 3.5–5.0)
ALK PHOS: 65 U/L (ref 38–126)
ALT: 86 U/L — AB (ref 14–54)
AST: 69 U/L — ABNORMAL HIGH (ref 15–41)
Anion gap: 7 (ref 5–15)
BUN: 9 mg/dL (ref 6–20)
CALCIUM: 10.1 mg/dL (ref 8.9–10.3)
CO2: 29 mmol/L (ref 22–32)
Chloride: 101 mmol/L (ref 101–111)
Creatinine, Ser: 0.62 mg/dL (ref 0.44–1.00)
GFR calc Af Amer: >60 mL/min (ref >60)
GFR calc non Af Amer: >60 mL/min (ref >60)
Glucose, Bld: 92 mg/dL (ref 65–99)
Potassium: 4.1 mmol/L (ref 3.5–5.1)
SODIUM: 137 mmol/L (ref 135–145)
TOTAL PROTEIN: 8.4 g/dL — AB (ref 6.5–8.1)
Total Bilirubin: 0.4 mg/dL (ref 0.3–1.2)

## 2015-08-02 LAB — PHENYTOIN LEVEL, TOTAL: Phenytoin Lvl: 9.8 ug/mL — ABNORMAL LOW (ref 10.0–20.0)

## 2015-08-02 LAB — CULTURE, BLOOD (ROUTINE X 2)

## 2015-08-02 LAB — CBC
HCT: 37.6 % (ref 36.0–46.0)
HEMOGLOBIN: 13.2 g/dL (ref 12.0–15.0)
MCH: 30.9 pg (ref 26.0–34.0)
MCHC: 35.1 g/dL (ref 30.0–36.0)
MCV: 88.1 fL (ref 78.0–100.0)
Platelets: 321 10*3/uL (ref 150–400)
RBC: 4.27 MIL/uL (ref 3.87–5.11)
RDW: 18.4 % — ABNORMAL HIGH (ref 11.5–15.5)
WBC: 6.9 10*3/uL (ref 4.0–10.5)

## 2015-08-02 LAB — GLUCOSE, CAPILLARY
GLUCOSE-CAPILLARY: 107 mg/dL — AB (ref 65–99)
GLUCOSE-CAPILLARY: 83 mg/dL (ref 65–99)
GLUCOSE-CAPILLARY: 90 mg/dL (ref 65–99)
Glucose-Capillary: 88 mg/dL (ref 65–99)
Glucose-Capillary: 158 mg/dL — ABNORMAL HIGH (ref 65–99)
Glucose-Capillary: 126 mg/dL — ABNORMAL HIGH (ref 65–99)

## 2015-08-02 LAB — URINE CULTURE: CULTURE: NO GROWTH

## 2015-08-02 MED ORDER — AMLODIPINE BESYLATE 5 MG PO TABS
5.0000 mg | ORAL_TABLET | Freq: Every day | ORAL | Status: DC
  Administered 2015-08-02 (×2): 5 mg
  Filled 2015-08-02 (×2): qty 1

## 2015-08-02 MED ORDER — PHENYTOIN 125 MG/5ML PO SUSP
100.0000 mg | Freq: Three times a day (TID) | ORAL | Status: DC
  Administered 2015-08-02 – 2015-08-03 (×4): 100 mg via ORAL
  Filled 2015-08-02 (×8): qty 4

## 2015-08-02 NOTE — Clinical Social Work Note (Signed)
Patient has bed at [facility] and [facility] (choosen by family).   CSW will continue to follow pt and pt's family for continued support and to facilitate pt's discharge needs once medically stable.   Glendon Axe, MSW, LCSWA [phone] 08/02/2015 3:36 PM

## 2015-08-02 NOTE — Clinical Social Work Note (Signed)
CSW has contacted patient's dtrs, Jonelle Sidle and Denman George and left messages in regards to SNF placement. Pt's dtr's have NOT made decision on which SNF pt would discharge to.  CSW remains available as needed.  Glendon Axe, MSW, LCSWA [phone] 08/02/2015 3:24 PM

## 2015-08-02 NOTE — Progress Notes (Signed)
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 Pt was seen in 2015, arousal was a barrier to swallowing. PEG was placed. MRI shows old infarction in the left MCA territory affecting the posterior temporal lobe and parietal region having progressed to atrophy, encephalomalacia and gliosis. Areas of acute infarction representing extension of infarction in that region. 2 punctate foci of acute infarction separate from that area in the left frontal lobe.     OT comments  Session focused on family education for management of painful RUE for ROM and positioning. Discussed possible use of kinesiotape for pain control. Family very appreciative. Will continue to follow acutely.   Follow Up Recommendations  SNF;Supervision/Assistance - 24 hour    Equipment Recommendations  None recommended by OT    Recommendations for Other Services      Precautions / Restrictions Precautions Precautions: Fall Precaution Comments: poor ability to control R ankle Required Braces or Orthoses: Other Brace/Splint (cam boot for R ankle for transfers only)              ADL Overall ADL's : Needs assistance/impaired                                       General ADL Comments: total A at this time. Pt would grasp cloth but not wipe her face. Began crying with hand over hand to use LUE                                      Cognition   Behavior During Therapy: Flat affect Overall Cognitive Status: Difficult to assess                       Extremity/Trunk Assessment     Painful RUE. Pt begins to cry when approaching RUE. Completed gentle PROM within pain  tolerance. Began education regarding gentle ROM and realistic goals of ROM at this stage of recovery. Able to achieve PROM [facility] r hand and wrist. Able to achieve @ 90 elbow flexion before pt grimaces. Shoulder FF/ER and Abd to @ 45.  RUE supported on pillows with hand elevated.  Family verbalized nee to keep RUE supported. Family also asking questions about "subluxed" shoulders. Educated family on causes of subluxation and management of a flaccid extremity to assist with managing pain.           Exercises Other Exercises Other Exercises: educating family on ROM for painful RUE for hand elbow and shoulder   Shoulder Instructions       General Comments      Pertinent Vitals/ Pain       Pain Assessment: Faces Faces Pain Scale: Hurts even more Pain Location: BUE with movemetn Pain Descriptors / Indicators: Grimacing;Moaning Pain Intervention(s): Monitored during session;Repositioned  Home Living                                          Prior Functioning/Environment  Frequency Min 2X/week     Progress Toward Goals  OT Goals(current goals can now be found in the care plan section)  Progress towards OT goals: Progressing toward goals  Acute Rehab OT Goals Patient Stated Goal: per daughter -to get back to single person level of care OT Goal Formulation: With family Time For Goal Achievement: 08/11/15 Potential to Achieve Goals: Fair ADL Goals Pt Will Perform Grooming: with mod assist Additional ADL Goal #1: Pt will maintain postural control EOB with S x 5 min Additional ADL Goal #2: Family independent with ROM and positioning for painful RUE.   Plan Discharge plan remains appropriate    Co-evaluation                 End of Session     Activity Tolerance Patient tolerated treatment well;Patient limited by pain   Patient Left in bed;with call bell/phone within reach;with family/visitor present   Nurse Communication Mobility  status        Time: 1310-1330 OT Time Calculation (min): 20 min  Charges: OT General Charges $OT Visit: 1 Procedure OT Treatments $Self Care/Home Management : 8-22 mins  Lise Pincus,HILLARY 08/02/2015, 1:46 PM   [facility], OTR/L  [phone] 08/02/2015

## 2015-08-02 NOTE — Progress Notes (Signed)
[facility] PROGRESS NOTE  Melissa [name] ZOX:[id number] DOB: [birth year] DOA: 07/24/2015 PCP: [name], MD HPI/Subjective: 69 y.o. 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 She presents to the emergency department with left-sided seizure. Per her primary caretaker [name], the patient has been feeling well recently aside from left hip and leg pain thought to be due to sciatica. This morning at approximately 8 AM [name] noticed her mother's left lower extremity twitching. Within about 30 minutes the twitching grew stronger and her left upper extremity began twitching as well. Her mother became unresponsive. And turned her face towards the right. After approximately an hour the twitching decreased in severity and her mother became a little bit more responsive. In the emergency department, UA is positive for many bacteria, CT head is negative for acute changes, chest x-ray shows atelectasis versus left lower lobe pneumonia. 8/7 awake, alert tracks you with her eyes occasionally attempts to speak, does not follow commands 8/8  awake, alert tracks you with her eyes occasionally attempts to speak,, attempting to speak follow some commands    Assessment/Plan: Seizure --MRI: Old infarction in the left MCA territory affecting the posterior temporal lobe and parietal region having progressed to atrophy, encephalomalacia and gliosis. Areas of acute infarction representing extension of infarction in that region. 2 punctate foci of acute infarction separate from that area in the left frontal lobe. Extensive old infarctions elsewhere throughout the brain as outlined above. -Seizure medication being titrated by stroke team- keppra, vimpat, dilantin   Bacteremia with Staphylococcus  Fever -8/7 Blood Culture 1/2 positive GPC clusters- monitor if not contaminant will need IV abx -Urine Culture- NGTD  c diff + PO vanc for 14 days  Left lower lobe pneumonia(CAP) vs atelectasis Unable to do incentive spirometry -avoid abx as patient has c diff   Chronic kidney disease -Creatinine at baseline  Hypertension -target BP is SBP 145-180 -Continue carvedilol, 6.25 mg BID -Clonidine patch 0.3 mg -lisinopril 5 mg daily -hydralazine IV 2 mg PRN SBP> 180   Hyperlipidemia -Continue pravastatin  Depression  -continue Cymbalta and Klonopin.  -Per pharmacy rec Prozac is being tapered off. We'll discontinue  Hypothyroidism -Continue Synthroid  Dysphagia -Continue PEG tube feedings with free water flushes. Nutrition consulted.  Rt Foot Drop -Cam Walker boot Rt foot      Code Status: DO NOT RESUSCITATE Family Communication: daughter Disposition Plan: SNF 1-2 days   Consultants:    Procedures: 8/1 CT head without contrast; -old infarct in left MCA territory.- No acute cortical infarction. No mass lesion - Stable old infarcts in right frontal lobe and right cerebellum. 8/1 MRI brain without contrast;Old infarction in the left MCA territory affecting the posterior temporal lobe and parietal region having progressed to atrophy/encephalomalacia and gliosis.  -Acute infarction representing extension of infarction in that region.  -2 punctate foci of acute infarction separate from that area in the left frontal lobe.    Cultures  8/1 urine negative 8/1 blood right arm/hand negative 8/7 blood 1/2 positive GPC in clusters  8/8 urine pending      Objective: Filed Vitals:   08/01/15 1700 08/01/15 2054 08/02/15 0223 08/02/15 0525  BP: 182/75 162/56 191/59 171/86  Pulse: 86 80 90 98  Temp: 98.6 F (37 C) 98.4 F (36.9 C) 98.8 F (37.1 C)  98.2 F (36.8 C)  TempSrc: Axillary Oral Axillary Axillary  Resp: 20 20 20 20   Height:      Weight:    87.59 kg  (193 lb 1.6 oz)  SpO2: 100% 93% 100% 100%    Intake/Output Summary (Last 24 hours) at 08/02/15 1023 Last data filed at 08/02/15 1610  Gross per 24 hour  Intake   1054 ml  Output    150 ml  Net    904 ml   Filed Weights   07/30/15 0627 08/01/15 0400 08/02/15 0525  Weight: 89.858 kg (198 lb 1.6 oz) 87.408 kg (192 lb 11.2 oz) 87.59 kg (193 lb 1.6 oz)     Exam: General: awake, will make eye contact, not following commands Lungs: Clear to auscultation bilaterally without wheezes or crackles Cardiovascular: Regular rate and rhythm without murmur gallop or rub normal S1 and S2 Neurologic:  Spontaneously moves RUE/RLE, tracks you with her eyes, attempts to speak, with draws R UE/R LE to painful stimuli.   Data Reviewed: Basic Metabolic Panel:  Recent Labs Lab 07/27/15 1110 07/28/15 0550 07/29/15 0930 07/31/15 1102 08/02/15 0541  NA 138 137 136 140 137  K 3.4* 3.9 3.3* 4.2 4.1  CL 105 102 104 107 101  CO2 29 27 26 27 29   GLUCOSE 133* 97 102* 104* 92  BUN 17 13 11 10 9   CREATININE 0.77 0.67 0.65 0.58 0.62  CALCIUM 9.2 9.3 9.0 9.8 10.1  MG  --   --  1.9 1.9  --    Liver Function Tests:  Recent Labs Lab 07/29/15 0930 07/31/15 1102 08/02/15 0541  AST 24 57* 69*  ALT 23 55* 86*  ALKPHOS 51 55 65  BILITOT 0.3 0.5 0.4  PROT 6.3* 7.7 8.4*  ALBUMIN 2.4* 2.8* 3.0*   No results for input(s): LIPASE, AMYLASE in the last 168 hours. No results for input(s): AMMONIA in the last 168 hours. CBC:  Recent Labs Lab 07/27/15 1110 07/28/15 0550 07/29/15 0930 07/31/15 1102 08/02/15 0541  WBC 6.9 5.7 8.7 7.2 6.9  NEUTROABS  --   --  6.1 4.3  --   HGB 11.0* 11.3* 10.2* 12.4 13.2  HCT 31.7* 32.7* 29.9* 35.6* 37.6  MCV 88.1 87.0 86.7 88.1 88.1  PLT 271 288 283 286 321   Cardiac Enzymes: No results for input(s): CKTOTAL, CKMB, CKMBINDEX, TROPONINI in the last 168 hours. BNP (last 3 results) No results for input(s): BNP in the last 8760 hours.  ProBNP (last 3  results)  Recent Labs  09/19/14 1004  PROBNP 1843.0*    CBG:  Recent Labs Lab 08/01/15 1155 08/01/15 1650 08/01/15 2049 08/02/15 0012 08/02/15 0422  GLUCAP 134* 110* 88 107* 90    Recent Results (from the past 240 hour(s))  Culture, Urine     Status: None   Collection Time: 07/24/15  2:15 PM  Result Value Ref Range Status   Specimen Description URINE, RANDOM  Final   Special Requests CX ADDED AT 0215 ON [id number]  Final   Culture NO GROWTH 1 DAY  Final   Report Status 07/26/2015 FINAL  Final  Culture, blood (routine x 2)     Status: None   Collection Time: 07/24/15  5:20 PM  Result Value Ref Range Status   Specimen Description BLOOD RIGHT ARM  Final   Special Requests BOTTLES DRAWN AEROBIC AND ANAEROBIC 10CC  Final   Culture NO GROWTH 5 DAYS  Final   Report Status 07/29/2015 FINAL  Final  Culture,  blood (routine x 2)     Status: None   Collection Time: 07/24/15  5:25 PM  Result Value Ref Range Status   Specimen Description BLOOD RIGHT HAND  Final   Special Requests BOTTLES DRAWN AEROBIC ONLY 10CC  Final   Culture NO GROWTH 5 DAYS  Final   Report Status 07/29/2015 FINAL  Final  Culture, blood (routine x 2)     Status: None (Preliminary result)   Collection Time: 07/30/15  9:25 PM  Result Value Ref Range Status   Specimen Description BLOOD LEFT ARM  Final   Special Requests BOTTLES DRAWN AEROBIC AND ANAEROBIC 5CC  Final   Culture NO GROWTH 1 DAY  Final   Report Status PENDING  Incomplete  Culture, blood (routine x 2)     Status: None (Preliminary result)   Collection Time: 07/30/15  9:30 PM  Result Value Ref Range Status   Specimen Description BLOOD RIGHT HAND  Final   Special Requests BOTTLES DRAWN AEROBIC ONLY 5CC  Final   Culture  Setup Time   Final    GRAM POSITIVE COCCI IN CLUSTERS AEROBIC BOTTLE ONLY CRITICAL RESULT CALLED TO, READ BACK BY AND VERIFIED WITHSherran Needs RN [id number] 07/31/15 A BROWNING    Culture TOO YOUNG TO READ  Final   Report Status PENDING   Incomplete  Culture, Urine     Status: None (Preliminary result)   Collection Time: 07/31/15  3:05 AM  Result Value Ref Range Status   Specimen Description URINE, RANDOM  Final   Special Requests NONE  Final   Culture NO GROWTH 1 DAY  Final   Report Status PENDING  Incomplete  C difficile quick scan w PCR reflex     Status: Abnormal   Collection Time: 07/31/15  8:57 PM  Result Value Ref Range Status   C Diff antigen POSITIVE (A) NEGATIVE Final   C Diff toxin POSITIVE (A) NEGATIVE Final   C Diff interpretation Positive for toxigenic C. difficile  Final    Comment: CRITICAL RESULT CALLED TO, READ BACK BY AND VERIFIED WITHSherran Needs RN [id number] 07/31/15 A BROWNING      Studies: Dg Chest Port 1 View  08/01/2015   CLINICAL DATA:  Bacteremia  EXAM: PORTABLE CHEST - 1 VIEW  COMPARISON:  July 30, 2015  FINDINGS: There is persistent left lower lobe consolidation. There is atelectatic change in the right base with possible superimposed pneumonia in this area. No new opacity. Heart is enlarged with pulmonary vascularity within normal limits. No adenopathy. There is degenerative change in the thoracic spine.  There is slight leftward deviation of the upper thoracic trachea.  IMPRESSION: Airspace consolidation in the left base. Atelectasis with possible associated airspace consolidation right base. Stable cardiomegaly. Question thyroid enlargement given mild leftward deviation of the thoracic trachea.   Electronically Signed   By: Bretta Bang III M.D.   On: 08/01/2015 01:10    Scheduled Meds: . amLODipine  5 mg Per Tube QHS  . carvedilol  6.25 mg Per Tube BID WC  . cloNIDine  0.3 mg Transdermal Weekly  . clopidogrel  75 mg Oral Daily  . DULoxetine  30 mg Oral BID  . enoxaparin (LOVENOX) injection  40 mg Subcutaneous Q24H  . feeding supplement (OSMOLITE 1.5 CAL)  237 mL Per Tube 5 X Daily  . feeding supplement (PRO-STAT SUGAR FREE 64)  30 mL Per Tube BID  . free water  100 mL Per Tube 6 times per  day  .  lacosamide  100 mg Oral BID  . levETIRAcetam  1,000 mg Oral BID  . levothyroxine  75 mcg Per Tube QAC breakfast  . lisinopril  5 mg Oral Daily  . phenytoin  100 mg Oral TID  . potassium chloride  20 mEq Oral TID  . pravastatin  40 mg Oral q1800  . sodium chloride  3 mL Intravenous Q12H  . vancomycin  125 mg Per Tube QID   Continuous Infusions:   Principal Problem:   Seizure Active Problems:   Hypothyroid   Obesity   Depression   VERTIGO, INTERMITTENT   Hyperlipidemia   Hypertension   History of kidney cancer   Anemia   CKD (chronic kidney disease) stage 3, GFR 30-59 ml/min   Chronic ischemic left MCA stroke   Left lower lobe pneumonia   Urinary tract infection   Left hip pain   Hemiplegia affecting right dominant side   Multiple myeloma   Acute ischemic stroke   Systolic dysfunction without heart failure   Partial seizure   Right elbow pain   Stroke   Acute cystitis without hematuria   CAP (community acquired pneumonia)   CKD (chronic kidney disease)   Essential hypertension   HLD (hyperlipidemia)   Other specified hypothyroidism   Dysphagia   Fever and chills   Right foot drop   Bacteremia due to Staphylococcus   Chronic kidney disease    Time spent: 25 min    Babatunde Seago  [facility] Pager [phone]. If 7PM-7AM, please contact night-coverage at [email or web address], password [facility] 08/02/2015, 10:23 AM  LOS: 9 days

## 2015-08-03 DIAGNOSIS — I10 Essential (primary) hypertension: Secondary | ICD-10-CM | POA: Diagnosis not present

## 2015-08-03 DIAGNOSIS — A0472 Enterocolitis due to Clostridium difficile, not specified as recurrent: Secondary | ICD-10-CM | POA: Diagnosis not present

## 2015-08-03 DIAGNOSIS — I6990 Unspecified sequelae of unspecified cerebrovascular disease: Secondary | ICD-10-CM | POA: Diagnosis not present

## 2015-08-03 DIAGNOSIS — N3000 Acute cystitis without hematuria: Secondary | ICD-10-CM | POA: Diagnosis not present

## 2015-08-03 DIAGNOSIS — M79631 Pain in right forearm: Secondary | ICD-10-CM | POA: Diagnosis not present

## 2015-08-03 DIAGNOSIS — N183 Chronic kidney disease, stage 3 (moderate): Secondary | ICD-10-CM | POA: Diagnosis not present

## 2015-08-03 DIAGNOSIS — E785 Hyperlipidemia, unspecified: Secondary | ICD-10-CM | POA: Diagnosis not present

## 2015-08-03 DIAGNOSIS — R4701 Aphasia: Secondary | ICD-10-CM | POA: Diagnosis not present

## 2015-08-03 DIAGNOSIS — I519 Heart disease, unspecified: Secondary | ICD-10-CM | POA: Diagnosis not present

## 2015-08-03 DIAGNOSIS — Z85528 Personal history of other malignant neoplasm of kidney: Secondary | ICD-10-CM | POA: Diagnosis not present

## 2015-08-03 DIAGNOSIS — R1311 Dysphagia, oral phase: Secondary | ICD-10-CM | POA: Diagnosis not present

## 2015-08-03 DIAGNOSIS — I6900 Unspecified sequelae of nontraumatic subarachnoid hemorrhage: Secondary | ICD-10-CM | POA: Diagnosis not present

## 2015-08-03 DIAGNOSIS — M6281 Muscle weakness (generalized): Secondary | ICD-10-CM | POA: Diagnosis not present

## 2015-08-03 DIAGNOSIS — R1310 Dysphagia, unspecified: Secondary | ICD-10-CM | POA: Diagnosis not present

## 2015-08-03 DIAGNOSIS — M21371 Foot drop, right foot: Secondary | ICD-10-CM | POA: Diagnosis not present

## 2015-08-03 DIAGNOSIS — E669 Obesity, unspecified: Secondary | ICD-10-CM | POA: Diagnosis not present

## 2015-08-03 DIAGNOSIS — M792 Neuralgia and neuritis, unspecified: Secondary | ICD-10-CM | POA: Diagnosis not present

## 2015-08-03 DIAGNOSIS — G40909 Epilepsy, unspecified, not intractable, without status epilepticus: Secondary | ICD-10-CM | POA: Diagnosis not present

## 2015-08-03 DIAGNOSIS — R279 Unspecified lack of coordination: Secondary | ICD-10-CM | POA: Diagnosis not present

## 2015-08-03 DIAGNOSIS — M25511 Pain in right shoulder: Secondary | ICD-10-CM | POA: Diagnosis not present

## 2015-08-03 DIAGNOSIS — I63032 Cerebral infarction due to thrombosis of left carotid artery: Secondary | ICD-10-CM | POA: Diagnosis not present

## 2015-08-03 DIAGNOSIS — F339 Major depressive disorder, recurrent, unspecified: Secondary | ICD-10-CM | POA: Diagnosis not present

## 2015-08-03 DIAGNOSIS — E039 Hypothyroidism, unspecified: Secondary | ICD-10-CM | POA: Diagnosis not present

## 2015-08-03 DIAGNOSIS — Z7401 Bed confinement status: Secondary | ICD-10-CM | POA: Diagnosis not present

## 2015-08-03 DIAGNOSIS — R1312 Dysphagia, oropharyngeal phase: Secondary | ICD-10-CM | POA: Diagnosis not present

## 2015-08-03 DIAGNOSIS — M25552 Pain in left hip: Secondary | ICD-10-CM | POA: Diagnosis not present

## 2015-08-03 DIAGNOSIS — I69959 Hemiplegia and hemiparesis following unspecified cerebrovascular disease affecting unspecified side: Secondary | ICD-10-CM | POA: Diagnosis not present

## 2015-08-03 DIAGNOSIS — R41841 Cognitive communication deficit: Secondary | ICD-10-CM | POA: Diagnosis not present

## 2015-08-03 DIAGNOSIS — R2689 Other abnormalities of gait and mobility: Secondary | ICD-10-CM | POA: Diagnosis not present

## 2015-08-03 DIAGNOSIS — I63412 Cerebral infarction due to embolism of left middle cerebral artery: Secondary | ICD-10-CM | POA: Diagnosis not present

## 2015-08-03 DIAGNOSIS — M25521 Pain in right elbow: Secondary | ICD-10-CM | POA: Diagnosis not present

## 2015-08-03 DIAGNOSIS — G8191 Hemiplegia, unspecified affecting right dominant side: Secondary | ICD-10-CM | POA: Diagnosis not present

## 2015-08-03 DIAGNOSIS — M2550 Pain in unspecified joint: Secondary | ICD-10-CM | POA: Diagnosis not present

## 2015-08-03 DIAGNOSIS — R7881 Bacteremia: Secondary | ICD-10-CM | POA: Diagnosis not present

## 2015-08-03 DIAGNOSIS — D649 Anemia, unspecified: Secondary | ICD-10-CM | POA: Diagnosis not present

## 2015-08-03 DIAGNOSIS — I639 Cerebral infarction, unspecified: Secondary | ICD-10-CM | POA: Diagnosis not present

## 2015-08-03 DIAGNOSIS — A047 Enterocolitis due to Clostridium difficile: Secondary | ICD-10-CM | POA: Diagnosis not present

## 2015-08-03 DIAGNOSIS — N390 Urinary tract infection, site not specified: Secondary | ICD-10-CM | POA: Diagnosis not present

## 2015-08-03 DIAGNOSIS — R569 Unspecified convulsions: Secondary | ICD-10-CM | POA: Diagnosis not present

## 2015-08-03 LAB — GLUCOSE, CAPILLARY
GLUCOSE-CAPILLARY: 82 mg/dL (ref 65–99)
GLUCOSE-CAPILLARY: 84 mg/dL (ref 65–99)
Glucose-Capillary: 137 mg/dL — ABNORMAL HIGH (ref 65–99)
Glucose-Capillary: 139 mg/dL — ABNORMAL HIGH (ref 65–99)

## 2015-08-03 LAB — PHENYTOIN LEVEL, TOTAL: PHENYTOIN LVL: 8.8 ug/mL — AB (ref 10.0–20.0)

## 2015-08-03 MED ORDER — HYDROCODONE-ACETAMINOPHEN 5-325 MG PO TABS: 1.0000 | ORAL_TABLET | Freq: Two times a day (BID) | ORAL | Status: DC | PRN

## 2015-08-03 MED ORDER — VANCOMYCIN 50 MG/ML ORAL SOLUTION: 125.0000 mg | Freq: Four times a day (QID) | ORAL | Status: DC

## 2015-08-03 MED ORDER — LISINOPRIL 5 MG PO TABS: 5.0000 mg | ORAL_TABLET | Freq: Every day | ORAL | Status: DC

## 2015-08-03 MED ORDER — CLONAZEPAM 0.5 MG PO TABS: 0.5000 mg | ORAL_TABLET | Freq: Three times a day (TID) | ORAL | Status: DC | PRN

## 2015-08-03 MED ORDER — LEVETIRACETAM 100 MG/ML PO SOLN: 1000.0000 mg | Freq: Two times a day (BID) | ORAL | Status: DC

## 2015-08-03 MED ORDER — PHENYTOIN 125 MG/5ML PO SUSP: 100.0000 mg | Freq: Three times a day (TID) | ORAL | Status: DC

## 2015-08-03 MED ORDER — OSMOLITE 1.5 CAL PO LIQD: 237.0000 mL | Freq: Every day | ORAL | Status: DC

## 2015-08-03 MED ORDER — CHLORHEXIDINE GLUCONATE 0.12 % MT SOLN
15.0000 mL | Freq: Two times a day (BID) | OROMUCOSAL | Status: DC
  Administered 2015-08-03: 15 mL via OROMUCOSAL
  Filled 2015-08-03: qty 15

## 2015-08-03 MED ORDER — CLOPIDOGREL BISULFATE 75 MG PO TABS: 75.0000 mg | ORAL_TABLET | Freq: Every day | ORAL | Status: DC

## 2015-08-03 MED ORDER — CETYLPYRIDINIUM CHLORIDE 0.05 % MT LIQD
7.0000 mL | Freq: Two times a day (BID) | OROMUCOSAL | Status: DC
  Administered 2015-08-03: 7 mL via OROMUCOSAL

## 2015-08-03 MED ORDER — LACOSAMIDE 100 MG PO TABS: 100.0000 mg | ORAL_TABLET | Freq: Two times a day (BID) | ORAL | Status: DC

## 2015-08-03 NOTE — Clinical Social Work Note (Signed)
Clinical Social Worker facilitated patient discharge including contacting patient family and facility to confirm patient discharge plans.  Clinical information faxed to facility and family agreeable with plan.  CSW arranged ambulance transport via [facility] to [facility] and [place].  RN to call report prior to discharge.  DC packet prepared and on chart for transport with number for report.  Clinical Social Worker will sign off for now as social work intervention is no longer needed. Please consult [place] again if new need arises.  Glendon Axe, MSW, LCSWA [phone] 08/03/2015 12:50 PM

## 2015-08-03 NOTE — Clinical Social Work Placement (Signed)
CLINICAL SOCIAL WORK PLACEMENT  NOTE  Date:  08/03/2015  Patient Details  Name: Melissa [name] MRN: [id number] Date of Birth: May 04, 1946  Clinical Social Work is seeking post-discharge placement for this patient at the Skilled  Nursing Facility level of care (*CSW will initial, date and re-position this form in  chart as items are completed):  Yes   Patient/family provided with [facility] Clinical Social Work Department's list of facilities offering this level of care within the geographic area requested by the patient (or if unable, by the patient's family).  Yes   Patient/family informed of their freedom to choose among providers that offer the needed level of care, that participate in Medicare, Medicaid or managed care program needed by the patient, have an available bed and are willing to accept the patient.  Yes   Patient/family informed of [facility]'s ownership interest in [facility] and [facility], as well as of the fact that they are under no obligation to receive care at these facilities.  PASRR submitted to EDS on       PASRR number received on       Existing PASRR number confirmed on 07/30/15     FL2 transmitted to all facilities in geographic area requested by pt/family on 07/30/15     FL2 transmitted to all facilities within larger geographic area on 07/30/15     Patient informed that his/her managed care company has contracts with or will negotiate with certain facilities, including the following:        Yes   Patient/family informed of bed offers received.  Patient chooses bed at  [facility] and [place] )     Physician recommends and patient chooses bed at      Patient to be transferred to  [facility] and [place] ) on 08/03/15.  Patient to be transferred to facility by  [facility] )     Patient family notified on 08/03/15 of transfer.  Name of family member notified:   (Pt's dt [name])     PHYSICIAN Please  prepare prescriptions, Please prepare priority discharge summary, including medications     Additional Comment:    _______________________________________________ Derenda Fennel, MSW, LCSWA [phone] 08/03/2015 10:01 AM

## 2015-08-03 NOTE — Discharge Summary (Signed)
Physician Discharge Summary  Melissa [name] W[facility]:[id number] DOB: 1946/06/12 DOA: 07/24/2015  PCP: [name], MD  Admit date: 07/24/2015 Discharge date: 08/03/2015  Time spent: greater than 30 minutes  Recommendations for Outpatient Follow-up:  1. May taper her Keppra as tolerated if free from seizures.  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 difficile colitis   Discharge Condition: Stable  Diet recommendation: Patient gets tube feeds. See below. Able to take pured with thin liquids as tolerated.  Filed Weights   08/01/15 0400 08/02/15 0525 08/03/15 0421  Weight: 87.408 kg (192 lb 11.2 oz) 87.59 kg (193 lb 1.6 oz) 87.136 kg (192 lb 1.6 oz)    History of present illness/Hospital Course:  69 y.o. 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 She presents to the emergency department with possible left-sided seizure. In ED she was found to have UTI and was admitted for treatment of UTI and ongoing jerking movements of the left arm and leg. MRI brain abnormal. (see below)    Partial seizures. Confirmed by EEG. Initially started on Keppra. Subsequently, Vimpat was  added for continued seizure activity. After Dilantin started, seizures abated, as evidenced by serial EEGs. No seizure activity noted for several days. Neurology recommending tapering Keppra as tolerated if no further seizure activity.  Patient has worked with physical therapy and occupational therapy who recommended skilled nursing facility. Patient is stable for transfer today. Will need follow-up with personal neurologist, Dr. Gerilyn Pilgrim for ongoing management of anti-epileptic drugs.    Abnormal brain imaging: Dr. Roda Shutters has reviewed films and not convinced that is definite acute infarct as it may represent laminar necrosis or mineral deposit. Possibly watershed infarct v embolic. MRA in 08/2014 showed right M1 cut off.   Carotid Doppler No significant stenosis   2D Echo EF 35-40% down from 08/2014 60-65%  TEE 09/2014 EF 65-70%, no SOE but large PFO  Venous doppler - No evidence of DVT or superficial thrombosis.  EP consulted and feel patient not a good candidate for loop recorder  LDL 72  HgbA1c 5.8 in April  D/w dr. Roda Shutters. He rec changing ASA to plavix.   Neurology consulted EP to see whether loop recorder was appropriate. She has not felt to be a candidate for same and this was not placed. She reportedly has had an outpatient 30 day event monitor which was reportedly normal, but we do not have results. Ongoing aggressive stroke risk factor management   Urinary tract infection, abnormal chest x-ray, possible pneumonia on imaging, but clinically no evidence of pneumonia: Culture negative. Treated with 5 days total of antibiotics   Hypothyroid: TSH okay.  Systolic  dysfunction: new. No evidence of CHF. Added ace inhibitor   Chronic ischemic left MCA stroke with right hemiparesis. Family reports that she has PEG tube for poor oral intake but is able to swallow.  Patient developed fever during hospitalization after adequate treatment of her UTI.  Blood cultures grew 1 out of 2  staph epidermidis. Patient initially was started on vancomycin. This is likely contaminant and has been stopped. Also, stool was tested for C. difficile and is positive. She will receive a 14 day course of vancomycin per G-tube.  She is fever free and having infrequent stools.  Procedures:  None  Consultations:  Neurology  Electrophysiology  Discharge Exam: Filed Vitals:   08/03/15 1036  BP: 155/89  Pulse: 88  Temp: 98.6 F (37 C)  Resp:     General: Eyes closed. No obvious seizure activity  Cardiovascular: Regular rate rhythm without murmurs gallops rubs Respiratory: Clear to auscultation bilaterally without wheezes rhonchi or rales  Discharge Instructions   Discharge Instructions    Walk with assistance    Complete by:  As directed           Current Discharge Medication List    START taking these medications   Details  clopidogrel (PLAVIX) 75 MG tablet Take 1 tablet (75 mg total) by mouth daily.    lacosamide 100 MG TABS Take 1 tablet (100 mg total) by mouth 2 (two) times daily. Qty: 60 tablet    levETIRAcetam (KEPPRA) 100 MG/ML solution Take 10 mLs (1,000 mg total) by mouth 2 (two) times daily. Qty: 473 mL, Refills: 12    Nutritional Supplements (FEEDING SUPPLEMENT, OSMOLITE 1.5 CAL,) LIQD Place 237 mLs into feeding tube 5 (five) times daily. Refills: 0    phenytoin (DILANTIN) 125 MG/5ML suspension Take 4 mLs (100 mg total) by mouth 3 (three) times daily. Qty: 237 mL, Refills: 12    vancomycin (VANCOCIN) 50 mg/mL oral solution Place 2.5 mLs (125 mg total) into feeding tube 4 (four) times daily. Through 08/14/15, then stop      CONTINUE these medications which have CHANGED   Details  clonazePAM (KLONOPIN) 0.5 MG tablet Place 1 tablet (0.5 mg total) into feeding tube 3 (three) times daily as needed. Qty: 15 tablet, Refills: 0    HYDROcodone-acetaminophen (NORCO/VICODIN) 5-325 MG per tablet Take 1 tablet by mouth 2 (two) times daily as needed for moderate  pain. Qty: 10 tablet, Refills: 0      CONTINUE these medications which have NOT CHANGED   Details  albuterol (PROVENTIL) (2.5 MG/3ML) 0.083% nebulizer solution Take 2.5 mg by nebulization every 6 (six) hours as needed for wheezing or shortness of breath.    Amino Acids-Protein Hydrolys (FEEDING SUPPLEMENT, PRO-STAT SUGAR FREE 64,) LIQD Take 30 mLs by mouth daily. Qty: 900 mL, Refills: 0    amLODipine (NORVASC) 10 MG tablet Place 1 tablet (10 mg total) into feeding tube every morning.    budesonide-formoterol (SYMBICORT) 160-4.5 MCG/ACT inhaler Inhale 2 puffs into the lungs at bedtime.     carvedilol (COREG) 6.25 MG tablet TAKE ONE TABLET INTO FEEDING TUBE TWICE DAILY WITH MEALS Qty: 60 tablet, Refills: 3    Cholecalciferol (VITAMIN D3) 50000 UNITS TABS Take 1 tablet by mouth once a week. By feeding tube Qty: 4 tablet, Refills: 5    cloNIDine (CATAPRES - DOSED IN MG/24 HR) 0.3 mg/24hr patch APPLY 1 PATCH TOPICALLY ONCE A WEEK Qty: 4 patch, Refills: 2    DULoxetine (CYMBALTA) 30 MG capsule 30 mg by Feeding  Tube route 2 (two) times daily.    Associated Diagnoses: Multiple myeloma without remission    levothyroxine (SYNTHROID, LEVOTHROID) 75 MCG tablet One tablet mon-fri and one half tab on sat and sun Qty: 26 tablet, Refills: 5   Associated Diagnoses: Hypothyroidism, unspecified hypothyroidism type    lovastatin (MEVACOR) 40 MG tablet Place 1 tablet (40 mg total) into feeding tube at bedtime.    Water For Irrigation, Sterile (FREE WATER) SOLN Place 100 mLs into feeding tube every 4 (four) hours.    prochlorperazine (COMPAZINE) 10 MG tablet 10 mg by Feeding Tube route every 6 (six) hours as needed for nausea or vomiting.    Associated Diagnoses: Multiple myeloma without remission      STOP taking these medications     aspirin 81 MG chewable tablet      gabapentin (NEURONTIN) 300 MG capsule      Ketamine HCl POWD      meclizine (ANTIVERT) 25 MG tablet      Multiple  Vitamins-Minerals (MULTIVITAMIN WITH MINERALS) tablet      pantoprazole (PROTONIX) 40 MG tablet      polyethylene glycol (MIRALAX / GLYCOLAX) packet        Allergies  Allergen Reactions  . Morphine Nausea And Vomiting   Follow-up Information    Follow up with [facility], KOFI, MD In 1 month.   Specialty:  Neurology   Why:  for hospital follow up   Contact information:   [address] [place] [facility] [address] [phone]        The results of significant diagnostics from this hospitalization (including imaging, microbiology, ancillary and laboratory) are listed below for reference.    Significant Diagnostic Studies: Dg Chest 1 View  07/24/2015   CLINICAL DATA:  Muscle twitching.  EXAM: CHEST  1 VIEW  COMPARISON:  09/28/2014.  FINDINGS: Stable enlarged cardiac silhouette. Left lower lobe opacity. Mild linear density at the right lung base. Thoracic spine degenerative changes.  IMPRESSION: 1. Left lower lobe atelectasis or pneumonia. 2. Mild right basilar atelectasis. 3. Stable cardiomegaly.   Electronically Signed   By: Beckie Salts M.D.   On: 07/24/2015 13:50   Dg Elbow 2 Views Right  07/26/2015   CLINICAL DATA:  Right elbow pain, limited range of motion, no further history is available  EXAM: RIGHT ELBOW - 2 VIEW  COMPARISON:  Elbow series of Apr 25, 2015  FINDINGS: The bones are osteopenic. The radial head is intact. The olecranon is grossly intact. The condylar and supracondylar regions of the distal humerus are intact where visualized. There is soft tissue calcification along the posterior aspect of the joint. There is a posterior fat pad sign consistent with an effusion. There is a tiny olecranon spur.  IMPRESSION: No definite acute fracture or dislocation is observed. There is a joint effusion. Degenerative changes associated with the posterior aspect of the elbow joint are present. There has not been significant interval change since the previous study.   Electronically Signed    By: David  [place] M.D.   On: 07/26/2015 16:07   Ct Head Wo Contrast  07/24/2015   CLINICAL DATA:  New onset seizure  EXAM: CT HEAD WITHOUT CONTRAST  TECHNIQUE: Contiguous axial images were obtained from the base of the skull through the vertex without intravenous contrast.  COMPARISON:  09/29/2014  FINDINGS: No skull fracture is noted. No intracranial hemorrhage, mass effect or midline shift. There is old infarct in left MCA territory. No acute cortical infarction. No mass lesion  is noted on this unenhanced scan. Mild cerebral atrophy. Mild periventricular white matter decreased attenuation probable due to chronic small vessel ischemic changes. Stable old infarcts in right frontal lobe and right cerebellum.  IMPRESSION: No acute intracranial abnormality. Stable bilateral old infarcts as described above. Stable mild cerebral atrophy.   Electronically Signed   By: Natasha Mead M.D.   On: 07/24/2015 14:19   Mr Brain Wo Contrast  07/24/2015   CLINICAL DATA:  Chronic right sided weakness from previous stroke. Left-sided seizure today.  EXAM: MRI HEAD WITHOUT CONTRAST  TECHNIQUE: Multiplanar, multiecho pulse sequences of the brain and surrounding structures were obtained without intravenous contrast.  COMPARISON:  Head CT same day.  MRI 09/19/2014.  FINDINGS: The study suffers from motion degradation. There chronic small-vessel ischemic changes throughout the pons. There are numerous old cerebellar infarctions bilaterally. No acute cerebellar insult. There is old infarction in the left MCA territory affecting the posterior temporal lobe and parietal lobe on the left . This has progressed to atrophy and encephalomalacia with evidence of old laminar necrosis. In the region of old infarction, there are areas of acute extension. There are 1 or 2 punctate foci of acute infarction elsewhere in the left frontal lobe. No evidence of swelling or acute hemorrhage. Chronic small vessel ischemic changes affect the cerebral  hemisphere white matter bilaterally. There is an old right frontoparietal infarction at the vertex. No evidence of mass lesion, hydrocephalus or extra-axial collection. Major vessels at the base of the brain show flow.  IMPRESSION: Old infarction in the left MCA territory affecting the posterior temporal lobe and parietal region having progressed to atrophy, encephalomalacia and gliosis. Areas of acute infarction representing extension of infarction in that region. 2 punctate foci of acute infarction separate from that area in the left frontal lobe.  Extensive old infarctions elsewhere throughout the brain as outlined above.   Electronically Signed   By: Paulina Fusi M.D.   On: 07/24/2015 20:05   Dg Chest Port 1 View  08/01/2015   CLINICAL DATA:  Bacteremia  EXAM: PORTABLE CHEST - 1 VIEW  COMPARISON:  July 30, 2015  FINDINGS: There is persistent left lower lobe consolidation. There is atelectatic change in the right base with possible superimposed pneumonia in this area. No new opacity. Heart is enlarged with pulmonary vascularity within normal limits. No adenopathy. There is degenerative change in the thoracic spine.  There is slight leftward deviation of the upper thoracic trachea.  IMPRESSION: Airspace consolidation in the left base. Atelectasis with possible associated airspace consolidation right base. Stable cardiomegaly. Question thyroid enlargement given mild leftward deviation of the thoracic trachea.   Electronically Signed   By: Bretta Bang III M.D.   On: 08/01/2015 01:10   Dg Chest Port 1 View  07/30/2015   CLINICAL DATA:  Fever with pneumonia  EXAM: PORTABLE CHEST - 1 VIEW  COMPARISON:  July 24, 2015  FINDINGS: There is persistent airspace consolidation in the left lower lobe. There is atelectasis in the right base. Lungs elsewhere clear. Heart is mildly enlarged with pulmonary vascularity within normal limits. No adenopathy. There is degenerative change in the thoracic spine.  IMPRESSION:  Persistent airspace consolidation left lower lobe. Patchy atelectasis right base is stable. No new opacity. Heart prominent but stable.   Electronically Signed   By: Bretta Bang III M.D.   On: 07/30/2015 21:29   Dg Hip Unilat With Pelvis 2-3 Views Left  07/24/2015   CLINICAL DATA:  Left hip pain following  a seizure today.  EXAM: DG HIP (WITH OR WITHOUT PELVIS) 2-3V LEFT  COMPARISON:  CT scan 11/06/2014  FINDINGS: Both hips are normally located. Mild degenerative changes but no acute hip fracture. No definite pelvic fractures. Vascular calcifications are noted.  IMPRESSION: No definite acute hip or pelvic fractures.   Electronically Signed   By: Rudie Meyer M.D.   On: 07/24/2015 19:13   Echo Left ventricle: The cavity size was mildly dilated. Wall thickness was normal. Systolic function was moderately reduced. The estimated ejection fraction was in the range of 35% to 40%. Diffuse hypokinesis. Doppler parameters are consistent with abnormal left ventricular relaxation (grade 1 diastolic dysfunction). - Mitral valve: There was mild regurgitation. - Pulmonary arteries: Systolic pressure was moderately increased. PA peak pressure: 41 mm Hg (S).  Microbiology: Recent Results (from the past 240 hour(s))  Culture, Urine     Status: None   Collection Time: 07/24/15  2:15 PM  Result Value Ref Range Status   Specimen Description URINE, RANDOM  Final   Special Requests CX ADDED AT 0215 ON [id number]  Final   Culture NO GROWTH 1 DAY  Final   Report Status 07/26/2015 FINAL  Final  Culture, blood (routine x 2)     Status: None   Collection Time: 07/24/15  5:20 PM  Result Value Ref Range Status   Specimen Description BLOOD RIGHT ARM  Final   Special Requests BOTTLES DRAWN AEROBIC AND ANAEROBIC 10CC  Final   Culture NO GROWTH 5 DAYS  Final   Report Status 07/29/2015 FINAL  Final  Culture, blood (routine x 2)     Status: None   Collection Time: 07/24/15  5:25 PM  Result Value Ref  Range Status   Specimen Description BLOOD RIGHT HAND  Final   Special Requests BOTTLES DRAWN AEROBIC ONLY 10CC  Final   Culture NO GROWTH 5 DAYS  Final   Report Status 07/29/2015 FINAL  Final  Culture, blood (routine x 2)     Status: None (Preliminary result)   Collection Time: 07/30/15  9:25 PM  Result Value Ref Range Status   Specimen Description BLOOD LEFT ARM  Final   Special Requests BOTTLES DRAWN AEROBIC AND ANAEROBIC 5CC  Final   Culture NO GROWTH 3 DAYS  Final   Report Status PENDING  Incomplete  Culture, blood (routine x 2)     Status: None   Collection Time: 07/30/15  9:30 PM  Result Value Ref Range Status   Specimen Description BLOOD RIGHT HAND  Final   Special Requests BOTTLES DRAWN AEROBIC ONLY 5CC  Final   Culture  Setup Time   Final    GRAM POSITIVE COCCI IN CLUSTERS AEROBIC BOTTLE ONLY CRITICAL RESULT CALLED TO, READ BACK BY AND VERIFIED WITHSherran Needs RN 2126 07/31/15 A BROWNING    Culture   Final    STAPHYLOCOCCUS SPECIES (COAGULASE NEGATIVE) THE SIGNIFICANCE OF ISOLATING THIS ORGANISM FROM A SINGLE SET OF BLOOD CULTURES WHEN MULTIPLE SETS ARE DRAWN IS UNCERTAIN. PLEASE NOTIFY THE MICROBIOLOGY DEPARTMENT WITHIN ONE WEEK IF SPECIATION AND SENSITIVITIES ARE REQUIRED.    Report Status 08/02/2015 FINAL  Final  Culture, Urine     Status: None   Collection Time: 07/31/15  3:05 AM  Result Value Ref Range Status   Specimen Description URINE, RANDOM  Final   Special Requests NONE  Final   Culture NO GROWTH 2 DAYS  Final   Report Status 08/02/2015 FINAL  Final  C difficile quick scan w PCR  reflex     Status: Abnormal   Collection Time: 07/31/15  8:57 PM  Result Value Ref Range Status   C Diff antigen POSITIVE (A) NEGATIVE Final   C Diff toxin POSITIVE (A) NEGATIVE Final   C Diff interpretation Positive for toxigenic C. difficile  Final    Comment: CRITICAL RESULT CALLED TO, READ BACK BY AND VERIFIED WITHSherran Needs RN 2245 07/31/15 A BROWNING      Labs: Basic Metabolic  Panel:  Recent Labs Lab 07/28/15 0550 07/29/15 0930 07/31/15 1102 08/02/15 0541  NA 137 136 140 137  K 3.9 3.3* 4.2 4.1  CL 102 104 107 101  CO2 27 26 27 29   GLUCOSE 97 102* 104* 92  BUN 13 11 10 9   CREATININE 0.67 0.65 0.58 0.62  CALCIUM 9.3 9.0 9.8 10.1  MG  --  1.9 1.9  --    Liver Function Tests:  Recent Labs Lab 07/29/15 0930 07/31/15 1102 08/02/15 0541  AST 24 57* 69*  ALT 23 55* 86*  ALKPHOS 51 55 65  BILITOT 0.3 0.5 0.4  PROT 6.3* 7.7 8.4*  ALBUMIN 2.4* 2.8* 3.0*   No results for input(s): LIPASE, AMYLASE in the last 168 hours. No results for input(s): AMMONIA in the last 168 hours. CBC:  Recent Labs Lab 07/28/15 0550 07/29/15 0930 07/31/15 1102 08/02/15 0541  WBC 5.7 8.7 7.2 6.9  NEUTROABS  --  6.1 4.3  --   HGB 11.3* 10.2* 12.4 13.2  HCT 32.7* 29.9* 35.6* 37.6  MCV 87.0 86.7 88.1 88.1  PLT 288 283 286 321   Cardiac Enzymes: No results for input(s): CKTOTAL, CKMB, CKMBINDEX, TROPONINI in the last 168 hours. BNP: BNP (last 3 results) No results for input(s): BNP in the last 8760 hours.  ProBNP (last 3 results)  Recent Labs  09/19/14 1004  PROBNP 1843.0*    CBG:  Recent Labs Lab 08/02/15 1726 08/02/15 1959 08/03/15 0014 08/03/15 0417 08/03/15 0754  GLUCAP 83 126* 137* 82 139*       Signed:  Kailin Leu L  [facility] 08/03/2015, 12:20 PM

## 2015-08-05 LAB — CULTURE, BLOOD (ROUTINE X 2): CULTURE: NO GROWTH

## 2015-08-14 DIAGNOSIS — M2550 Pain in unspecified joint: Secondary | ICD-10-CM | POA: Diagnosis not present

## 2015-08-14 DIAGNOSIS — I10 Essential (primary) hypertension: Secondary | ICD-10-CM | POA: Diagnosis not present

## 2015-08-14 DIAGNOSIS — I6990 Unspecified sequelae of unspecified cerebrovascular disease: Secondary | ICD-10-CM | POA: Diagnosis not present

## 2015-08-14 DIAGNOSIS — M792 Neuralgia and neuritis, unspecified: Secondary | ICD-10-CM | POA: Diagnosis not present

## 2015-08-15 ENCOUNTER — Ambulatory Visit: Payer: Medicare Other | Admitting: Orthopedic Surgery

## 2015-08-29 ENCOUNTER — Telehealth: Payer: Self-pay

## 2015-08-29 NOTE — Telephone Encounter (Signed)
Received request for another 30 day event monitor from Dr Merlene Laughter as pt had second CVA.First monitor was worn from 6/2 thru 7/1 and was read by Dr Bronson Ing as NSR with PVC's.I conferred with Dr Domenic Polite and he suggested that patient be referred to EP doctors in [place] for possible implanted loop recorder. I left message for office manager Raquel Sarna to call back when she was available

## 2015-08-31 ENCOUNTER — Telehealth: Payer: Self-pay

## 2015-08-31 ENCOUNTER — Telehealth: Payer: Self-pay | Admitting: Family Medicine

## 2015-08-31 MED ORDER — AMLODIPINE BESYLATE 5 MG PO TABS: 5.0000 mg | ORAL_TABLET | Freq: Every day | ORAL | Status: DC

## 2015-08-31 NOTE — Telephone Encounter (Signed)
Pls advise them to add amlodipinre 5 mg daily I will send to her pharmacy, continue current meds

## 2015-08-31 NOTE — Telephone Encounter (Signed)
[facility] NEEDS A NOTE STATING THAT Melissa [name] HAS AN APPOINTMENT ON Monday THAT CANT BE R/S AT THIS TIME, PLEASE ADVISE?

## 2015-08-31 NOTE — Telephone Encounter (Signed)
She is on Clonidine 0.3mg  patches and Carvedilol 6.25

## 2015-08-31 NOTE — Telephone Encounter (Signed)
Letter composed and faxed to [facility]

## 2015-08-31 NOTE — Telephone Encounter (Signed)
Spoke with daughter Jonelle Sidle).   Family will continue to check blood pressure and collect Amlodipine from pharmacy.   She will keep appointment scheduled for 9/12

## 2015-09-03 DIAGNOSIS — M6281 Muscle weakness (generalized): Secondary | ICD-10-CM | POA: Diagnosis not present

## 2015-09-03 DIAGNOSIS — I6900 Unspecified sequelae of nontraumatic subarachnoid hemorrhage: Secondary | ICD-10-CM | POA: Diagnosis not present

## 2015-09-03 DIAGNOSIS — R1311 Dysphagia, oral phase: Secondary | ICD-10-CM | POA: Diagnosis not present

## 2015-09-03 DIAGNOSIS — I63032 Cerebral infarction due to thrombosis of left carotid artery: Secondary | ICD-10-CM | POA: Diagnosis not present

## 2015-09-03 DIAGNOSIS — N183 Chronic kidney disease, stage 3 (moderate): Secondary | ICD-10-CM | POA: Diagnosis not present

## 2015-09-04 ENCOUNTER — Inpatient Hospital Stay (HOSPITAL_COMMUNITY)
Admission: EM | Admit: 2015-09-04 | Discharge: 2015-09-09 | DRG: 178 | Disposition: A | Discharge status: Home Health | Payer: Medicare Other | Attending: Family Medicine | Admitting: Family Medicine

## 2015-09-04 ENCOUNTER — Ambulatory Visit (INDEPENDENT_AMBULATORY_CARE_PROVIDER_SITE_OTHER): Payer: Medicare Other | Admitting: Family Medicine

## 2015-09-04 ENCOUNTER — Encounter: Payer: Self-pay | Admitting: Family Medicine

## 2015-09-04 ENCOUNTER — Inpatient Hospital Stay (HOSPITAL_COMMUNITY): Payer: Medicare Other

## 2015-09-04 ENCOUNTER — Emergency Department (HOSPITAL_COMMUNITY): Payer: Medicare Other

## 2015-09-04 ENCOUNTER — Encounter (HOSPITAL_COMMUNITY): Payer: Self-pay | Admitting: *Deleted

## 2015-09-04 VITALS — BP 126/74 | HR 91 | Temp 99.7°F | Resp 16

## 2015-09-04 DIAGNOSIS — D72829 Elevated white blood cell count, unspecified: Secondary | ICD-10-CM | POA: Diagnosis not present

## 2015-09-04 DIAGNOSIS — Z905 Acquired absence of kidney: Secondary | ICD-10-CM | POA: Diagnosis not present

## 2015-09-04 DIAGNOSIS — E860 Dehydration: Secondary | ICD-10-CM

## 2015-09-04 DIAGNOSIS — I519 Heart disease, unspecified: Secondary | ICD-10-CM

## 2015-09-04 DIAGNOSIS — I69320 Aphasia following cerebral infarction: Secondary | ICD-10-CM | POA: Diagnosis not present

## 2015-09-04 DIAGNOSIS — Z833 Family history of diabetes mellitus: Secondary | ICD-10-CM | POA: Diagnosis not present

## 2015-09-04 DIAGNOSIS — J690 Pneumonitis due to inhalation of food and vomit: Principal | ICD-10-CM | POA: Diagnosis present

## 2015-09-04 DIAGNOSIS — R509 Fever, unspecified: Secondary | ICD-10-CM | POA: Diagnosis present

## 2015-09-04 DIAGNOSIS — R279 Unspecified lack of coordination: Secondary | ICD-10-CM | POA: Diagnosis not present

## 2015-09-04 DIAGNOSIS — R627 Adult failure to thrive: Secondary | ICD-10-CM | POA: Diagnosis not present

## 2015-09-04 DIAGNOSIS — E871 Hypo-osmolality and hyponatremia: Secondary | ICD-10-CM | POA: Diagnosis present

## 2015-09-04 DIAGNOSIS — Z931 Gastrostomy status: Secondary | ICD-10-CM

## 2015-09-04 DIAGNOSIS — R748 Abnormal levels of other serum enzymes: Secondary | ICD-10-CM | POA: Diagnosis not present

## 2015-09-04 DIAGNOSIS — Z8249 Family history of ischemic heart disease and other diseases of the circulatory system: Secondary | ICD-10-CM

## 2015-09-04 DIAGNOSIS — I5189 Other ill-defined heart diseases: Secondary | ICD-10-CM | POA: Diagnosis present

## 2015-09-04 DIAGNOSIS — I10 Essential (primary) hypertension: Secondary | ICD-10-CM | POA: Diagnosis not present

## 2015-09-04 DIAGNOSIS — R400 Somnolence: Secondary | ICD-10-CM | POA: Diagnosis not present

## 2015-09-04 DIAGNOSIS — R4182 Altered mental status, unspecified: Secondary | ICD-10-CM | POA: Diagnosis not present

## 2015-09-04 DIAGNOSIS — Z8579 Personal history of other malignant neoplasms of lymphoid, hematopoietic and related tissues: Secondary | ICD-10-CM | POA: Diagnosis not present

## 2015-09-04 DIAGNOSIS — R918 Other nonspecific abnormal finding of lung field: Secondary | ICD-10-CM | POA: Diagnosis not present

## 2015-09-04 DIAGNOSIS — G40909 Epilepsy, unspecified, not intractable, without status epilepticus: Secondary | ICD-10-CM | POA: Diagnosis not present

## 2015-09-04 DIAGNOSIS — I69351 Hemiplegia and hemiparesis following cerebral infarction affecting right dominant side: Secondary | ICD-10-CM

## 2015-09-04 DIAGNOSIS — Z66 Do not resuscitate: Secondary | ICD-10-CM | POA: Diagnosis not present

## 2015-09-04 DIAGNOSIS — A419 Sepsis, unspecified organism: Secondary | ICD-10-CM | POA: Diagnosis not present

## 2015-09-04 DIAGNOSIS — E669 Obesity, unspecified: Secondary | ICD-10-CM | POA: Diagnosis not present

## 2015-09-04 DIAGNOSIS — F0150 Vascular dementia without behavioral disturbance: Secondary | ICD-10-CM | POA: Diagnosis not present

## 2015-09-04 DIAGNOSIS — R40241 Glasgow coma scale score 13-15: Secondary | ICD-10-CM | POA: Diagnosis not present

## 2015-09-04 DIAGNOSIS — Z85528 Personal history of other malignant neoplasm of kidney: Secondary | ICD-10-CM | POA: Diagnosis not present

## 2015-09-04 DIAGNOSIS — E039 Hypothyroidism, unspecified: Secondary | ICD-10-CM | POA: Diagnosis present

## 2015-09-04 DIAGNOSIS — Z800 Family history of malignant neoplasm of digestive organs: Secondary | ICD-10-CM | POA: Diagnosis not present

## 2015-09-04 DIAGNOSIS — E785 Hyperlipidemia, unspecified: Secondary | ICD-10-CM | POA: Diagnosis present

## 2015-09-04 DIAGNOSIS — R7989 Other specified abnormal findings of blood chemistry: Secondary | ICD-10-CM | POA: Diagnosis not present

## 2015-09-04 DIAGNOSIS — Z743 Need for continuous supervision: Secondary | ICD-10-CM | POA: Diagnosis not present

## 2015-09-04 DIAGNOSIS — R945 Abnormal results of liver function studies: Secondary | ICD-10-CM

## 2015-09-04 DIAGNOSIS — R532 Functional quadriplegia: Secondary | ICD-10-CM | POA: Diagnosis not present

## 2015-09-04 DIAGNOSIS — J8410 Pulmonary fibrosis, unspecified: Secondary | ICD-10-CM | POA: Diagnosis not present

## 2015-09-04 DIAGNOSIS — I639 Cerebral infarction, unspecified: Secondary | ICD-10-CM | POA: Diagnosis present

## 2015-09-04 LAB — COMPREHENSIVE METABOLIC PANEL
ALT: 201 U/L — AB (ref 14–54)
AST: 142 U/L — AB (ref 15–41)
Albumin: 2.7 g/dL — ABNORMAL LOW (ref 3.5–5.0)
Alkaline Phosphatase: 69 U/L (ref 38–126)
Anion gap: 5 (ref 5–15)
BUN: 30 mg/dL — ABNORMAL HIGH (ref 6–20)
CO2: 29 mmol/L (ref 22–32)
CREATININE: 0.8 mg/dL (ref 0.44–1.00)
Calcium: 8.6 mg/dL — ABNORMAL LOW (ref 8.9–10.3)
Chloride: 96 mmol/L — ABNORMAL LOW (ref 101–111)
GFR calc Af Amer: >60 mL/min (ref >60)
Glucose, Bld: 124 mg/dL — ABNORMAL HIGH (ref 65–99)
Potassium: 4 mmol/L (ref 3.5–5.1)
Sodium: 130 mmol/L — ABNORMAL LOW (ref 135–145)
TOTAL PROTEIN: 7.9 g/dL (ref 6.5–8.1)
Total Bilirubin: 0.7 mg/dL (ref 0.3–1.2)

## 2015-09-04 LAB — URINE MICROSCOPIC-ADD ON

## 2015-09-04 LAB — CBC WITH DIFFERENTIAL/PLATELET
BASOS ABS: 0 10*3/uL (ref 0.0–0.1)
Basophils Relative: 0 % (ref 0–1)
Eosinophils Absolute: 0 10*3/uL (ref 0.0–0.7)
Eosinophils Relative: 0 % (ref 0–5)
HCT: 37.4 % (ref 36.0–46.0)
Hemoglobin: 12.8 g/dL (ref 12.0–15.0)
LYMPHS ABS: 2.1 10*3/uL (ref 0.7–4.0)
Lymphocytes Relative: 16 % (ref 12–46)
MCH: 31.2 pg (ref 26.0–34.0)
MCHC: 34.2 g/dL (ref 30.0–36.0)
MCV: 91.2 fL (ref 78.0–100.0)
Monocytes Absolute: 1.6 10*3/uL — ABNORMAL HIGH (ref 0.1–1.0)
Monocytes Relative: 12 % (ref 3–12)
NEUTROS ABS: 9.7 10*3/uL — AB (ref 1.7–7.7)
Neutrophils Relative %: 72 % (ref 43–77)
Platelets: 210 10*3/uL (ref 150–400)
RBC: 4.1 MIL/uL (ref 3.87–5.11)
RDW: 17.6 % — ABNORMAL HIGH (ref 11.5–15.5)
WBC: 13.3 10*3/uL — ABNORMAL HIGH (ref 4.0–10.5)

## 2015-09-04 LAB — URINALYSIS, ROUTINE W REFLEX MICROSCOPIC
BILIRUBIN URINE: NEGATIVE
GLUCOSE, UA: NEGATIVE mg/dL
HGB URINE DIPSTICK: NEGATIVE
KETONES UR: NEGATIVE mg/dL
Leukocytes, UA: NEGATIVE
Nitrite: NEGATIVE
PH: 5.5 (ref 5.0–8.0)
Protein, ur: 30 mg/dL — AB
Specific Gravity, Urine: 1.025 (ref 1.005–1.030)
Urobilinogen, UA: 0.2 mg/dL (ref 0.0–1.0)

## 2015-09-04 LAB — LIPASE, BLOOD: LIPASE: 12 U/L — AB (ref 22–51)

## 2015-09-04 LAB — LACTIC ACID, PLASMA: Lactic Acid, Venous: 2 mmol/L (ref 0.5–2.0)

## 2015-09-04 MED ORDER — DEXTROSE 5 % IV SOLN: 2.0000 g | Freq: Three times a day (TID) | INTRAVENOUS | Status: DC

## 2015-09-04 MED ORDER — CLONAZEPAM 0.5 MG PO TABS: 0.5000 mg | ORAL_TABLET | Freq: Three times a day (TID) | ORAL | Status: DC | PRN

## 2015-09-04 MED ORDER — BUDESONIDE-FORMOTEROL FUMARATE 160-4.5 MCG/ACT IN AERO
2.0000 | INHALATION_SPRAY | Freq: Every day | RESPIRATORY_TRACT | Status: DC
  Filled 2015-09-04: qty 6

## 2015-09-04 MED ORDER — ALBUTEROL SULFATE (2.5 MG/3ML) 0.083% IN NEBU: 2.5000 mg | INHALATION_SOLUTION | Freq: Four times a day (QID) | RESPIRATORY_TRACT | Status: DC | PRN

## 2015-09-04 MED ORDER — PRAVASTATIN SODIUM 40 MG PO TABS
40.0000 mg | ORAL_TABLET | Freq: Every day | ORAL | Status: DC
  Administered 2015-09-04 – 2015-09-06 (×3): 40 mg via ORAL
  Filled 2015-09-04 (×3): qty 1

## 2015-09-04 MED ORDER — CLOPIDOGREL BISULFATE 75 MG PO TABS
75.0000 mg | ORAL_TABLET | Freq: Every day | ORAL | Status: DC
  Administered 2015-09-05 – 2015-09-06 (×2): 75 mg via ORAL
  Filled 2015-09-04 (×2): qty 1

## 2015-09-04 MED ORDER — LACOSAMIDE 50 MG PO TABS
150.0000 mg | ORAL_TABLET | Freq: Two times a day (BID) | ORAL | Status: DC
  Administered 2015-09-04 – 2015-09-06 (×4): 150 mg via ORAL
  Filled 2015-09-04 (×4): qty 3

## 2015-09-04 MED ORDER — PROCHLORPERAZINE MALEATE 5 MG PO TABS: 10.0000 mg | ORAL_TABLET | Freq: Four times a day (QID) | ORAL | Status: DC | PRN

## 2015-09-04 MED ORDER — IOHEXOL 300 MG/ML  SOLN
75.0000 mL | Freq: Once | INTRAMUSCULAR | Status: AC | PRN
  Administered 2015-09-04: 75 mL via INTRAVENOUS

## 2015-09-04 MED ORDER — SODIUM CHLORIDE 0.9 % IV SOLN
1000.0000 mL | Freq: Once | INTRAVENOUS | Status: AC
  Administered 2015-09-04: 1000 mL via INTRAVENOUS

## 2015-09-04 MED ORDER — CLOPIDOGREL BISULFATE 75 MG PO TABS: 75.0000 mg | ORAL_TABLET | Freq: Every day | ORAL | Status: DC

## 2015-09-04 MED ORDER — LEVETIRACETAM 100 MG/ML PO SOLN
1000.0000 mg | Freq: Two times a day (BID) | ORAL | Status: DC
Start: 2015-09-04 — End: 2015-09-06
  Administered 2015-09-04 – 2015-09-06 (×4): 1000 mg via ORAL
  Filled 2015-09-04 (×8): qty 10

## 2015-09-04 MED ORDER — PHENYTOIN 125 MG/5ML PO SUSP
100.0000 mg | Freq: Three times a day (TID) | ORAL | Status: DC
  Administered 2015-09-04 – 2015-09-06 (×7): 100 mg via ORAL
  Filled 2015-09-04 (×12): qty 4

## 2015-09-04 MED ORDER — HYDROCODONE-ACETAMINOPHEN 5-325 MG PO TABS: 1.0000 | ORAL_TABLET | Freq: Two times a day (BID) | ORAL | Status: DC | PRN

## 2015-09-04 MED ORDER — DULOXETINE HCL 30 MG PO CPEP
30.0000 mg | ORAL_CAPSULE | Freq: Two times a day (BID) | ORAL | Status: DC
  Administered 2015-09-04 – 2015-09-06 (×4): 30 mg via ORAL
  Filled 2015-09-04 (×4): qty 1

## 2015-09-04 MED ORDER — AMLODIPINE BESYLATE 5 MG PO TABS: 10.0000 mg | ORAL_TABLET | Freq: Every day | ORAL | Status: DC

## 2015-09-04 MED ORDER — PIPERACILLIN-TAZOBACTAM 3.375 G IVPB 30 MIN
3.3750 g | Freq: Once | INTRAVENOUS | Status: AC
  Administered 2015-09-04: 3.375 g via INTRAVENOUS
  Filled 2015-09-04: qty 50

## 2015-09-04 MED ORDER — SODIUM CHLORIDE 0.9 % IV SOLN
1000.0000 mL | INTRAVENOUS | Status: DC
  Administered 2015-09-04: 1000 mL via INTRAVENOUS

## 2015-09-04 MED ORDER — VANCOMYCIN HCL IN DEXTROSE 750-5 MG/150ML-% IV SOLN
750.0000 mg | Freq: Once | INTRAVENOUS | Status: AC
  Administered 2015-09-04: 750 mg via INTRAVENOUS
  Filled 2015-09-04: qty 150

## 2015-09-04 MED ORDER — OSMOLITE 1.5 CAL PO LIQD
237.0000 mL | Freq: Every day | ORAL | Status: DC
  Administered 2015-09-04 – 2015-09-09 (×21): 237 mL
  Filled 2015-09-04 (×35): qty 1000

## 2015-09-04 MED ORDER — CLONIDINE HCL 0.3 MG/24HR TD PTWK
0.3000 mg | MEDICATED_PATCH | TRANSDERMAL | Status: DC
  Administered 2015-09-08: 0.3 mg via TRANSDERMAL
  Filled 2015-09-04: qty 1

## 2015-09-04 MED ORDER — LEVOTHYROXINE SODIUM 75 MCG PO TABS
75.0000 ug | ORAL_TABLET | Freq: Every day | ORAL | Status: DC
  Administered 2015-09-05 – 2015-09-06 (×2): 75 ug via ORAL
  Filled 2015-09-04 (×2): qty 1

## 2015-09-04 MED ORDER — VANCOMYCIN HCL IN DEXTROSE 1-5 GM/200ML-% IV SOLN
1000.0000 mg | Freq: Once | INTRAVENOUS | Status: AC
  Administered 2015-09-04: 1000 mg via INTRAVENOUS
  Filled 2015-09-04: qty 200

## 2015-09-04 MED ORDER — AMLODIPINE BESYLATE 5 MG PO TABS
10.0000 mg | ORAL_TABLET | Freq: Every day | ORAL | Status: DC
  Administered 2015-09-05 – 2015-09-06 (×2): 10 mg via ORAL
  Filled 2015-09-04 (×2): qty 2

## 2015-09-04 MED ORDER — VANCOMYCIN HCL IN DEXTROSE 1-5 GM/200ML-% IV SOLN
1000.0000 mg | Freq: Two times a day (BID) | INTRAVENOUS | Status: DC
  Administered 2015-09-05 – 2015-09-07 (×4): 1000 mg via INTRAVENOUS
  Filled 2015-09-04 (×7): qty 200

## 2015-09-04 MED ORDER — LABETALOL HCL 200 MG PO TABS: 200.0000 mg | ORAL_TABLET | Freq: Two times a day (BID) | ORAL | Status: DC

## 2015-09-04 MED ORDER — CARVEDILOL 3.125 MG PO TABS
6.2500 mg | ORAL_TABLET | Freq: Two times a day (BID) | ORAL | Status: DC
  Administered 2015-09-04 – 2015-09-08 (×8): 6.25 mg
  Filled 2015-09-04 (×9): qty 2

## 2015-09-04 MED ORDER — ENOXAPARIN SODIUM 40 MG/0.4ML ~~LOC~~ SOLN: 40.0000 mg | SUBCUTANEOUS | Status: DC

## 2015-09-04 MED ORDER — ONDANSETRON HCL 4 MG PO TABS: 4.0000 mg | ORAL_TABLET | Freq: Four times a day (QID) | ORAL | Status: DC | PRN

## 2015-09-04 MED ORDER — DULOXETINE HCL 30 MG PO CPEP: 30.0000 mg | ORAL_CAPSULE | Freq: Two times a day (BID) | ORAL | Status: DC

## 2015-09-04 MED ORDER — SODIUM CHLORIDE 0.9 % IV SOLN: INTRAVENOUS | Status: DC

## 2015-09-04 MED ORDER — FREE WATER
100.0000 mL | Status: DC
  Administered 2015-09-04 – 2015-09-09 (×30): 100 mL

## 2015-09-04 MED ORDER — ACETAMINOPHEN 500 MG PO TABS
1000.0000 mg | ORAL_TABLET | Freq: Once | ORAL | Status: AC
  Administered 2015-09-04: 1000 mg via ORAL
  Filled 2015-09-04: qty 2

## 2015-09-04 MED ORDER — ACETAMINOPHEN 325 MG PO TABS
650.0000 mg | ORAL_TABLET | Freq: Four times a day (QID) | ORAL | Status: DC | PRN
  Administered 2015-09-04 – 2015-09-07 (×5): 650 mg via ORAL
  Filled 2015-09-04 (×5): qty 2

## 2015-09-04 MED ORDER — PIPERACILLIN-TAZOBACTAM 3.375 G IVPB
3.3750 g | Freq: Three times a day (TID) | INTRAVENOUS | Status: DC
  Administered 2015-09-05 – 2015-09-09 (×12): 3.375 g via INTRAVENOUS
  Filled 2015-09-04 (×20): qty 50

## 2015-09-04 MED ORDER — PRO-STAT SUGAR FREE PO LIQD
30.0000 mL | ORAL | Status: DC
Start: 2015-09-04 — End: 2015-09-06
  Administered 2015-09-04 – 2015-09-06 (×3): 30 mL via ORAL
  Filled 2015-09-04 (×3): qty 30

## 2015-09-04 MED ORDER — OSMOLITE 1.5 CAL PO LIQD
237.0000 mL | Freq: Every day | ORAL | Status: DC
  Filled 2015-09-04 (×5): qty 237

## 2015-09-04 MED ORDER — LABETALOL HCL 200 MG PO TABS
200.0000 mg | ORAL_TABLET | Freq: Two times a day (BID) | ORAL | Status: DC
  Administered 2015-09-04 – 2015-09-06 (×4): 200 mg via ORAL
  Filled 2015-09-04 (×4): qty 1

## 2015-09-04 MED ORDER — CLONIDINE HCL 0.3 MG/24HR TD PTWK: 0.3000 mg | MEDICATED_PATCH | TRANSDERMAL | Status: DC

## 2015-09-04 MED ORDER — ONDANSETRON HCL 4 MG/2ML IJ SOLN: 4.0000 mg | Freq: Four times a day (QID) | INTRAMUSCULAR | Status: DC | PRN

## 2015-09-04 MED ORDER — ENOXAPARIN SODIUM 40 MG/0.4ML ~~LOC~~ SOLN
40.0000 mg | SUBCUTANEOUS | Status: DC
  Administered 2015-09-04 – 2015-09-08 (×5): 40 mg via SUBCUTANEOUS
  Filled 2015-09-04 (×5): qty 0.4

## 2015-09-04 NOTE — Progress Notes (Signed)
ANTIBIOTIC CONSULT NOTE - INITIAL  Pharmacy Consult for vancomycin and zosyn Indication: pneumonia  Allergies  Allergen Reactions  . Morphine Nausea And Vomiting    Patient Measurements: Height: 5\' 6"  (167.6 cm) Weight: 189 lb (85.73 kg) IBW/kg (Calculated) : 59.3   Vital Signs: Temp: 102.5 F (39.2 C) (09/12 1202) Temp Source: Rectal (09/12 1202) BP: 133/57 mmHg (09/12 1500) Pulse Rate: 85 (09/12 1515) Intake/Output from previous day:   Intake/Output from this shift:    Labs:  Recent Labs  09/04/15 1250  WBC 13.3*  HGB 12.8  PLT 210  CREATININE 0.80   Estimated Creatinine Clearance: 73.2 mL/min (by C-G formula based on Cr of 0.8). No results for input(s): VANCOTROUGH, VANCOPEAK, VANCORANDOM, GENTTROUGH, GENTPEAK, GENTRANDOM, TOBRATROUGH, TOBRAPEAK, TOBRARND, AMIKACINPEAK, AMIKACINTROU, AMIKACIN in the last 72 hours.   Microbiology: No results found for this or any previous visit (from the past 720 hour(s)).  Medical History: Past Medical History  Diagnosis Date  . Vertigo, intermittent   . Hypothyroidism   . Hyperlipidemia   . Obesity   . Hypertension     severe and resistant to treatment; 2008-negative left renal angiogram  . Vitiligo   . Degenerative disc disease, lumbar   . Degenerative disc disease, cervical   . Chest pain 2008    2008-normal coronary angiography  . 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 Renal cell carcinoma     Right nephrectomy  . Multiple myeloma 08/26/2014    Medications:  See medication history Assessment: 69 yo lady to start broad spectrum antibiotics for PNA.  Vancomycin 1gm and zosyn 3.375 gm IV ordered in the ED.  Goal of Therapy:  Vancomycin trough level 15-20 mcg/ml  Plan:  Give an additional 750 mg vanc for total load 1750 mg then  1gm IV q12 hours Continue zosyn 3.375 gm IV q8 hours F/u renal function, cultures and clinical course  Thanks for allowing pharmacy to be a part of this patient's  care.  Excell Seltzer, PharmD Clinical Pharmacist 09/04/2015,3:32 PM

## 2015-09-04 NOTE — ED Notes (Signed)
Nausea, vomiting and fever times 3 days.  EMS picked pt up at Dr. Griffin Dakin office.  Pt is at baseline neurologically.

## 2015-09-04 NOTE — H&P (Signed)
[facility] History and Physical  Lowell Felicia Sar VZD:[id number] DOB: 1946/03/18 DOA: 09/04/2015  Referring physician: ER PCP: [name], MD   Chief Complaint: Fever, emesis.  HPI: Melissa [name] is a 69 y.o. female  This is a 69 year old lady who has a past medical history of cerebrovascular disease, having suffered 2 strokes and who is since nonverbal, presents with fever for the last 3 days associated with few episodes of vomiting without hematemesis. She is fed from a PEG tube. Patient has not had a cough, abdominal pain or diarrhea. The patient is nonverbal from her previous strokes and at the present time is somewhat drowsy and unable to give me any clear history. There are no family members present at the bedside at the present time. Evaluation in the emergency room is suggestive of aspiration pneumonia based on chest x-ray findings. She is now being admitted for further management.   Review of Systems:  Unable to give me a review of systems secondary to drowsiness and unable to verbalize.  Past Medical History  Diagnosis Date  . Vertigo, intermittent   . Hypothyroidism   . Hyperlipidemia   . Obesity   . Hypertension     severe and resistant to treatment; 2008-negative left renal angiogram  . Vitiligo   . Degenerative disc disease, lumbar   . Degenerative disc disease, cervical   . Chest pain 2008    2008-normal coronary angiography  . 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 Renal cell carcinoma     Right nephrectomy  . Multiple myeloma 08/26/2014   Past Surgical History  Procedure Laterality Date  . Tubal ligation  1983    Bilateral  . Nephrectomy      Right; secondary to renal cell carcinoma  . Colonoscopy N/A 04/21/2014    Procedure: COLONOSCOPY;  Surgeon: Malissa Hippo, MD;  Location: [facility];  Service: Endoscopy;  Laterality: N/A;  . Colonoscopy N/A 04/21/2014    Procedure: COLONOSCOPY;  Surgeon: Malissa Hippo, MD;  Location: [facility];  Service: Endoscopy;  Laterality: N/A;  1030  . Tee without cardioversion N/A 09/23/2014    Procedure: TRANSESOPHAGEAL ECHOCARDIOGRAM (TEE);  Surgeon: Thurmon Fair, MD;  Location: [facility] ENDOSCOPY;  Service: Cardiovascular;  Laterality: N/A;   Social History:  reports that she has never smoked. She has never used smokeless tobacco. She reports that she does not drink alcohol or use illicit drugs.  Allergies  Allergen Reactions  . Morphine Nausea And Vomiting    Family History  Problem Relation Age of Onset  . Heart disease Mother 19  . Hypertension Mother   . Colon cancer Father   . Hypertension Sister     x2  . Diabetes Sister     x2  . Heart failure Sister   . Lupus Brother     Prior to Admission medications   Medication Sig Start Date End Date Taking? Authorizing Provider  acetaminophen (TYLENOL) 650 MG CR tablet 650 mg by Feeding Tube route every 8 (eight) hours as needed for pain.    Yes Historical Provider, MD  albuterol (PROVENTIL) (2.5 MG/3ML) 0.083% nebulizer solution Take 2.5 mg by nebulization every 6 (six) hours as needed for wheezing or shortness of breath.   Yes Historical Provider, MD  Amino Acids-Protein Hydrolys (FEEDING SUPPLEMENT, PRO-STAT SUGAR FREE 64,) LIQD Take 30 mLs by mouth daily. Patient taking differently: Take 30 mLs by mouth daily.  10/01/14  Yes Ripudeep Jenna Luo, MD  amLODipine (NORVASC)  10 MG tablet Take 10 mg by mouth daily.   Yes Historical Provider, MD  budesonide-formoterol (SYMBICORT) 160-4.5 MCG/ACT inhaler Inhale 2 puffs into the lungs at bedtime.    Yes Historical Provider, MD  carvedilol (COREG) 6.25 MG tablet TAKE ONE TABLET INTO FEEDING TUBE TWICE DAILY WITH MEALS 06/29/15  Yes Kerri Perches, MD  clonazePAM (KLONOPIN) 0.5 MG tablet Place 1 tablet (0.5 mg total) into feeding tube 3 (three) times daily as needed. Patient taking differently: Place 0.5 mg into feeding tube 3 (three) times daily as needed for anxiety.  08/03/15  Yes  Christiane Ha, MD  cloNIDine (CATAPRES - DOSED IN MG/24 HR) 0.3 mg/24hr patch APPLY 1 PATCH TOPICALLY ONCE A WEEK 07/11/15  Yes Kerri Perches, MD  clopidogrel (PLAVIX) 75 MG tablet Take 1 tablet (75 mg total) by mouth daily. 08/03/15  Yes Christiane Ha, MD  DULoxetine (CYMBALTA) 30 MG capsule 30 mg by Feeding Tube route 2 (two) times daily.  05/23/15  Yes Historical Provider, MD  HYDROcodone-acetaminophen (NORCO/VICODIN) 5-325 MG per tablet Take 1 tablet by mouth 2 (two) times daily as needed for moderate pain. Patient taking differently: 1 tablet by Feeding Tube route 2 (two) times daily as needed for moderate pain.  08/03/15  Yes Christiane Ha, MD  labetalol (NORMODYNE) 200 MG tablet 200 mg by Feeding Tube route 2 (two) times daily.    Yes Historical Provider, MD  Lacosamide 150 MG TABS Take 1 tablet by mouth 2 (two) times daily. X 14 days   Yes Historical Provider, MD  levETIRAcetam (KEPPRA) 100 MG/ML solution Take 10 mLs (1,000 mg total) by mouth 2 (two) times daily. 08/03/15  Yes Christiane Ha, MD  levothyroxine (SYNTHROID, LEVOTHROID) 75 MCG tablet One tablet mon-fri and one half tab on sat and sun Patient taking differently: Take 37.5-75 mcg by mouth daily before breakfast. take one half tablet sat and sun and 1 tab mon- fri 04/05/15  Yes Kerri Perches, MD  lovastatin (MEVACOR) 40 MG tablet Place 1 tablet (40 mg total) into feeding tube at bedtime. 10/01/14  Yes Ripudeep Jenna Luo, MD  Nutritional Supplements (FEEDING SUPPLEMENT, OSMOLITE 1.5 CAL,) LIQD Place 237 mLs into feeding tube 5 (five) times daily. 08/03/15  Yes Christiane Ha, MD  phenytoin (DILANTIN) 125 MG/5ML suspension Take 4 mLs (100 mg total) by mouth 3 (three) times daily. 08/03/15  Yes Christiane Ha, MD  Vitamin D, Ergocalciferol, (DRISDOL) 50000 UNITS CAPS capsule Take 50,000 Units by mouth every 7 (seven) days.   Yes Historical Provider, MD  Water For Irrigation, Sterile (FREE WATER) SOLN Place 100  mLs into feeding tube every 4 (four) hours. 10/01/14  Yes Ripudeep Jenna Luo, MD  Cholecalciferol (VITAMIN D3) 50000 UNITS TABS Take 1 tablet by mouth once a week. By feeding tube Patient not taking: Reported on 09/04/2015 04/05/15   Kerri Perches, MD  prochlorperazine (COMPAZINE) 10 MG tablet 10 mg by Feeding Tube route every 6 (six) hours as needed for nausea or vomiting.     Historical Provider, MD  vancomycin (VANCOCIN) 50 mg/mL oral solution Place 2.5 mLs (125 mg total) into feeding tube 4 (four) times daily. Through 08/14/15, then stop Patient not taking: Reported on 09/04/2015 08/03/15   Christiane Ha, MD   Physical Exam: Filed Vitals:   09/04/15 1430 09/04/15 1500 09/04/15 1515 09/04/15 1530  BP: 133/56 133/57  144/59  Pulse: 79 76 85 77  Temp:  99.5 F (37.5 C)  TempSrc:  Rectal    Resp: 20 17 23 18   Height:      Weight:      SpO2: 99% 100% 100% 100%    Wt Readings from Last 3 Encounters:  09/04/15 85.73 kg (189 lb)  08/03/15 87.136 kg (192 lb 1.6 oz)  04/25/15 108.863 kg (240 lb)    General:  Appears drowsy. Does open her eyes to painful stimuli. Appears to be hemodynamically stable. Does not look toxic or septic clinically. Eyes: PERRL, normal lids, irises & conjunctiva ENT: grossly normal hearing, lips & tongue Neck: no LAD, masses or thyromegaly Cardiovascular: RRR, no m/r/g. No LE edema. Telemetry: SR, no arrhythmias  Respiratory: Bilateral basal crackles more on the right than the left. No bronchial breathing or wheezing. Abdomen: soft, ntnd Skin: no rash or induration seen on limited exam Musculoskeletal: grossly normal tone BUE/BLE Psychiatric: Not examined. Neurologic: grossly non-focal.          Labs on Admission:  Basic Metabolic Panel:  Recent Labs Lab 09/04/15 1250  NA 130*  K 4.0  CL 96*  CO2 29  GLUCOSE 124*  BUN 30*  CREATININE 0.80  CALCIUM 8.6*   Liver Function Tests:  Recent Labs Lab 09/04/15 1250  AST 142*  ALT 201*    ALKPHOS 69  BILITOT 0.7  PROT 7.9  ALBUMIN 2.7*    Recent Labs Lab 09/04/15 1250  LIPASE 12*   No results for input(s): AMMONIA in the last 168 hours. CBC:  Recent Labs Lab 09/04/15 1250  WBC 13.3*  NEUTROABS 9.7*  HGB 12.8  HCT 37.4  MCV 91.2  PLT 210   Cardiac Enzymes: No results for input(s): CKTOTAL, CKMB, CKMBINDEX, TROPONINI in the last 168 hours.  BNP (last 3 results) No results for input(s): BNP in the last 8760 hours.  ProBNP (last 3 results)  Recent Labs  09/19/14 1004  PROBNP 1843.0*    CBG: No results for input(s): GLUCAP in the last 168 hours.  Radiological Exams on Admission: Dg Chest Portable 1 View  09/04/2015   CLINICAL DATA:  Fever, vomiting and lethargy x 2 days. Hx of stroke last Sept and was admitted this August for seizures. Hx htn. Pt unable to sit up or hold head up or follow breathing instructions.  EXAM: PORTABLE CHEST - 1 VIEW  COMPARISON:  08/01/2015  FINDINGS: Low lung volumes are present, causing crowding of the pulmonary vasculature. Mild to moderate enlargement of the cardiopericardial silhouette obscuration of the left hemidiaphragm compatible with left lower lobe airspace opacity. Retro diaphragmatic density in the posterior basal segment right lower lobe.  IMPRESSION: 1. Airspace opacity in the left lower lobe persists. This could represent pneumonia. If the patient has an explanation for chronic core recurring pneumonia such as aspiration then this may not necessarily require further investigation, but otherwise, chest CT should be considered to exclude an underlying mass or other underlying pathologic process as a cause for chronic/recurring pneumonia. 2. Mild to moderate cardiomegaly. 3. There is potentially some retro diaphragmatic airspace opacity on the right, making this potentially multilobar pneumonia.   Electronically Signed   By: Gaylyn Rong M.D.   On: 09/04/2015 12:57      Assessment/Plan   1. Aspiration  pneumonia. The appearances on the chest x-ray, with new findings on the right side, are consistent with this diagnosis. She will be treated with broad-spectrum antibiotics. We will obtain CT chest scan as per recommendations of the chest x-ray findings. I will ask swallowing evaluation  to be done. Nothing by mouth except for sips with medications. 2. Cerebrovascular disease. She does not appear to have had a new stroke. 3. History of chronic kidney disease. Creatinine appears to be normal range at the present time. Monitor closely. 4. Systolic dysfunction without heart failure. She is compensated at the present time.  Further recommendations will depend on patient's hospital progress.   Code Status: DO NOT RESUSCITATE. This was confirmed by the emergency room physician with the patient's family.   DVT Prophylaxis: Lovenox.  Family Communication: No family members present at the bedside.   Disposition Plan: Back to the skilled nursing facility when medically stable.   Time spent: 60 minutes.  Wilson Singer [facility] Pager [phone].

## 2015-09-04 NOTE — Assessment & Plan Note (Signed)
Increasing lethargy with reduced responsiveness, fever, vomiting needs ED eval for appropriate management Ambulance transported pt non emergently to [facility] Ed t

## 2015-09-04 NOTE — Patient Instructions (Signed)
F/u in 5 weeks , call if you need me before  Your Mom will be taken to the ED by ambulance based on current condition,  Order for cymbalta will be resent, allowing opening of capsule and putting in G tube

## 2015-09-04 NOTE — ED Provider Notes (Signed)
CSN: [id number]     Arrival date & time 09/04/15  1149 History   First MD Initiated Contact with Patient 09/04/15 1207     Chief Complaint  Patient presents with  . Fever  . Emesis     (Consider location/radiation/quality/duration/timing/severity/associated sxs/prior Treatment) HPI Comments: 69 y.o. Female with complicated past medical history including CVA x2 who has since been nonverbal, UTI, pneumonia, C diff presents with her daughters for fever, fatigue, and vomiting.  The patient was discharged from a nursing facility following her last CVA 4-5 days ago and since returning home over the last 3 days has been spiking fevers to above 102 F and has had a few episodes of non bloody vomiting.  She is fed exclusively through a PEG tube.  The patient has not had a cough or diarrhea.  She has had very strong, foul smelling urine.  Patient did complete a course of oral antibiotics for C Diff while at the nursing facility but has not had similar symptoms to when she was diagnose with C Diff per her daughters.  The patient is not able to give any history since she is non verbal.  Patient is a 69 y.o. female presenting with fever and vomiting.  Fever Associated symptoms: chills and vomiting   Associated symptoms: no congestion, no cough, no diarrhea, no rash and no rhinorrhea   Emesis Associated symptoms: chills   Associated symptoms: no diarrhea     Past Medical History  Diagnosis Date  . Vertigo, intermittent   . Hypothyroidism   . Hyperlipidemia   . Obesity   . Hypertension     severe and resistant to treatment; 2008-negative left renal angiogram  . Vitiligo   . Degenerative disc disease, lumbar   . Degenerative disc disease, cervical   . Chest pain 2008    2008-normal coronary angiography  . 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 Renal cell carcinoma     Right nephrectomy  . Multiple myeloma 08/26/2014   Past Surgical History  Procedure Laterality Date  . Tubal ligation   1983    Bilateral  . Nephrectomy      Right; secondary to renal cell carcinoma  . Colonoscopy N/A 04/21/2014    Procedure: COLONOSCOPY;  Surgeon: Rogene Houston, MD;  Location: [facility];  Service: Endoscopy;  Laterality: N/A;  . Colonoscopy N/A 04/21/2014    Procedure: COLONOSCOPY;  Surgeon: Rogene Houston, MD;  Location: [facility];  Service: Endoscopy;  Laterality: N/A;  1030  . Tee without cardioversion N/A 09/23/2014    Procedure: TRANSESOPHAGEAL ECHOCARDIOGRAM (TEE);  Surgeon: Sanda Klein, MD;  Location: [facility] ENDOSCOPY;  Service: Cardiovascular;  Laterality: N/A;   Family History  Problem Relation Age of Onset  . Heart disease Mother 14  . Hypertension Mother   . Colon cancer Father   . Hypertension Sister     x2  . Diabetes Sister     x2  . Heart failure Sister   . Lupus Brother    Social History  Substance Use Topics  . Smoking status: Never Smoker   . Smokeless tobacco: Never Used  . Alcohol Use: No   OB History    No data available    ROS completed with help of daughters at bedside and limited by fact that patient is non verbal. Review of Systems  Constitutional: Positive for fever, chills and fatigue.  HENT: Negative for congestion, facial swelling and rhinorrhea.   Respiratory: Negative for cough and  shortness of breath.   Gastrointestinal: Positive for vomiting. Negative for diarrhea and blood in stool.  Genitourinary: Negative for hematuria and decreased urine volume.       Foul, strong smelling urine  Musculoskeletal: Negative for neck stiffness.  Skin: Negative for rash.  Neurological: Positive for speech difficulty (non verbal since CVA ) and weakness (right sided paralysis and left sided weakness since previous CVAs per daughters).      Allergies  Morphine  Home Medications   Prior to Admission medications   Medication Sig Start Date End Date Taking? Authorizing Provider  acetaminophen (TYLENOL) 650 MG CR tablet Take 650 mg by mouth every  8 (eight) hours as needed for pain.    Historical Provider, MD  albuterol (PROVENTIL) (2.5 MG/3ML) 0.083% nebulizer solution Take 2.5 mg by nebulization every 6 (six) hours as needed for wheezing or shortness of breath.    Historical Provider, MD  Amino Acids-Protein Hydrolys (FEEDING SUPPLEMENT, PRO-STAT SUGAR FREE 64,) LIQD Take 30 mLs by mouth daily. Patient taking differently: Take 30 mLs by mouth daily.  10/01/14   Ripudeep Krystal Eaton, MD  amLODipine (NORVASC) 10 MG tablet Take 10 mg by mouth daily.    Historical Provider, MD  budesonide-formoterol (SYMBICORT) 160-4.5 MCG/ACT inhaler Inhale 2 puffs into the lungs at bedtime.     Historical Provider, MD  carvedilol (COREG) 6.25 MG tablet TAKE ONE TABLET INTO FEEDING TUBE TWICE DAILY WITH MEALS 06/29/15   Fayrene Helper, MD  Cholecalciferol (VITAMIN D3) 50000 UNITS TABS Take 1 tablet by mouth once a week. By feeding tube Patient taking differently: 1 tablet by Feeding Tube route once a week.  04/05/15   Fayrene Helper, MD  clonazePAM (KLONOPIN) 0.5 MG tablet Place 1 tablet (0.5 mg total) into feeding tube 3 (three) times daily as needed. 08/03/15   Delfina Redwood, MD  cloNIDine (CATAPRES - DOSED IN MG/24 HR) 0.3 mg/24hr patch APPLY 1 PATCH TOPICALLY ONCE A WEEK 07/11/15   Fayrene Helper, MD  clopidogrel (PLAVIX) 75 MG tablet Take 1 tablet (75 mg total) by mouth daily. 08/03/15   Delfina Redwood, MD  DULoxetine (CYMBALTA) 30 MG capsule 30 mg by Feeding Tube route 2 (two) times daily.  05/23/15   Historical Provider, MD  HYDROcodone-acetaminophen (NORCO/VICODIN) 5-325 MG per tablet Take 1 tablet by mouth 2 (two) times daily as needed for moderate pain. 08/03/15   Delfina Redwood, MD  labetalol (NORMODYNE) 200 MG tablet Take 200 mg by mouth 2 (two) times daily.    Historical Provider, MD  Lacosamide 150 MG TABS Take 1 tablet by mouth 2 (two) times daily. X 14 days    Historical Provider, MD  levETIRAcetam (KEPPRA) 100 MG/ML solution Take 10  mLs (1,000 mg total) by mouth 2 (two) times daily. 08/03/15   Delfina Redwood, MD  levothyroxine (SYNTHROID, LEVOTHROID) 75 MCG tablet One tablet mon-fri and one half tab on sat and sun Patient taking differently: Take 37.5-75 mcg by mouth daily before breakfast. take one half tablet sat and sun and 1 tab mon- fri 04/05/15   Fayrene Helper, MD  lovastatin (MEVACOR) 40 MG tablet Place 1 tablet (40 mg total) into feeding tube at bedtime. 10/01/14   Ripudeep Krystal Eaton, MD  Nutritional Supplements (FEEDING SUPPLEMENT, OSMOLITE 1.5 CAL,) LIQD Place 237 mLs into feeding tube 5 (five) times daily. 08/03/15   Delfina Redwood, MD  phenytoin (DILANTIN) 125 MG/5ML suspension Take 4 mLs (100 mg total) by mouth 3 (  three) times daily. 08/03/15   Delfina Redwood, MD  prochlorperazine (COMPAZINE) 10 MG tablet 10 mg by Feeding Tube route every 6 (six) hours as needed for nausea or vomiting.     Historical Provider, MD  vancomycin (VANCOCIN) 50 mg/mL oral solution Place 2.5 mLs (125 mg total) into feeding tube 4 (four) times daily. Through 08/14/15, then stop Patient not taking: Reported on 09/04/2015 08/03/15   Delfina Redwood, MD  Water For Irrigation, Sterile (FREE WATER) SOLN Place 100 mLs into feeding tube every 4 (four) hours. 10/01/14   Ripudeep K Rai, MD   BP 127/74 mmHg  Pulse 94  Temp(Src) 102.5 F (39.2 C) (Rectal)  Resp 20  Ht $R'5\' 6"'Qo$  (1.676 m)  Wt 189 lb (85.73 kg)  BMI 30.52 kg/m2  SpO2 100% Physical Exam  Constitutional: She appears well-nourished. She appears lethargic. She appears ill.  HENT:  Head: Normocephalic and atraumatic.  Eyes: Right eye exhibits no discharge. Left eye exhibits no discharge.  Neck: Neck supple. No rigidity.  Cardiovascular: Regular rhythm and normal pulses.  Tachycardia present.   Pulmonary/Chest: Effort normal. No respiratory distress. She has no wheezes. She has no rales.  Abdominal: Soft. She exhibits no distension. There is no tenderness.  PEG tube in place  in the right upper abdomen without erythema or drainage  Musculoskeletal: She exhibits no edema.  Neurological: She appears lethargic. She exhibits abnormal muscle tone (right sided paralysis and left sided weakness).  Skin: Skin is warm and dry. No rash noted.  Vitals reviewed.   ED Course  Procedures (including critical care time) Angiocath insertion Performed by: Earlie Server  Consent: Verbal consent obtained. Risks and benefits: risks, benefits and alternatives were discussed Time out: Immediately prior to procedure a "time out" was called to verify the correct patient, procedure, equipment, support staff and site/side marked as required.  Preparation: Patient was prepped and draped in the usual sterile fashion.  Vein Location: left brachial  Ultrasound Guided  Gauge: 20  Normal blood return and flush without difficulty Patient tolerance: Patient tolerated the procedure well with no immediate complications.     Labs Review Labs Reviewed  URINE CULTURE  CULTURE, BLOOD (ROUTINE X 2)  CULTURE, BLOOD (ROUTINE X 2)  CBC WITH DIFFERENTIAL/PLATELET  COMPREHENSIVE METABOLIC PANEL  LIPASE, BLOOD  URINALYSIS, ROUTINE W REFLEX MICROSCOPIC (NOT AT [facility])  LACTIC ACID, PLASMA  LACTIC ACID, PLASMA    Imaging Review No results found. I have personally reviewed and evaluated these images and lab results as part of my medical decision-making.   EKG Interpretation None      MDM  Patient presented with daughters febrile and tachycardic.  Heart rate improved with fluids and fever was treated with tylenol.  Results revealed leukocytosis and chest xray concerning for aspiration pneumonia.  Patient was started on Vanco/zosyn for aspiration pneumonia coverage.  Discussed case with Dr. Anastasio Champion who agreed with admission and patient was admitted to the Med/Surg floor under his care.  Daughters were updated on all results and plan of care.  Code status was addressed with both daughters  that asked the patient made DNR and said they did not want any life supporting measures.   Final diagnoses:  None    1. Aspiration Pneumonia  2. Sepsis  3. Leukocytosis    Harvel Quale, MD 09/04/15 [phone]

## 2015-09-04 NOTE — Progress Notes (Signed)
Subjective:    Patient ID: Melissa [name], female    DOB: 03/29/1946, 69 y.o.   MRN: [id number]  HPI Pt in today for follow up. Was hospitalized at [facility] from 8/11 for 10 days due to new seizure activity, then was d/c to facility for an additional 20 days, [place]  Since being home from last 5 days, fa Melissa [name] has called re elevated bP and fever, have had EMSto the home Today report increased lethargy, projectile vomiting,foul smell once Sat and one on Sunday pm, pt is totally PEG fed  Temp has been up to 103 as recently as 2am axillary Malodorus urine Denies skin breakdown  In past 2 days, tongue appears to be swollen, protruding from mouth  Family has concerns about possible s/e of inc dose of vimpat started by neurology on 8/22  And as far as heart is concerned  Request ED eval at [facility] hospital, however , after further discussion have decided to have her evaluated at [facility] as being most prudent course of action   Review of Systems See HPI     Objective:   Physical Exam BP 126/74 mmHg  Pulse 91  Temp(Src) 99.7 F (37.6 C) (Oral)  Resp 16  SpO2 97% Patient drowsy, responds to painful stimulus, no direct engagement with family members. Lethargic, drooling , facial asymetry, tongue protruding from one side of mouth  HEENT: ,   oropharynx  moist.  Neck decreased ROM  Chest: decreased air entry throughout, no wheezes, bibasilar crackles  CVS: S1, S2 no murmurs, no S3.Regular rate.  ABD: Soft no localized tenderness, guarding or rebpound  Ext: No edema  MS: decreased ROM spine, shoulders, hips and knees.       Assessment & Plan:  Altered mental status Increasing lethargy with reduced responsiveness, fever, vomiting needs ED eval for appropriate management Ambulance transported pt non emergently to [facility] Ed t

## 2015-09-05 ENCOUNTER — Inpatient Hospital Stay (HOSPITAL_COMMUNITY): Payer: Medicare Other

## 2015-09-05 DIAGNOSIS — G40909 Epilepsy, unspecified, not intractable, without status epilepticus: Secondary | ICD-10-CM

## 2015-09-05 DIAGNOSIS — I10 Essential (primary) hypertension: Secondary | ICD-10-CM

## 2015-09-05 DIAGNOSIS — Z931 Gastrostomy status: Secondary | ICD-10-CM

## 2015-09-05 DIAGNOSIS — R7989 Other specified abnormal findings of blood chemistry: Secondary | ICD-10-CM | POA: Diagnosis present

## 2015-09-05 DIAGNOSIS — R945 Abnormal results of liver function studies: Secondary | ICD-10-CM

## 2015-09-05 MED ORDER — METOCLOPRAMIDE HCL 5 MG/ML IJ SOLN
5.0000 mg | Freq: Four times a day (QID) | INTRAMUSCULAR | Status: DC
  Administered 2015-09-05 – 2015-09-06 (×3): 5 mg via INTRAVENOUS
  Filled 2015-09-05 (×3): qty 2

## 2015-09-05 NOTE — Progress Notes (Signed)
Spoke with Dr.Elmahi regarding no Iv assess. He states that this will be addressed in the morning due to pt possibly needing PICC placement.

## 2015-09-05 NOTE — Progress Notes (Signed)
Despite multiple attempts there is no Iv access at this time. Dr.Kirby made aware

## 2015-09-05 NOTE — Progress Notes (Signed)
Initial Nutrition Assessment  DOCUMENTATION CODES:  Obesity unspecified  INTERVENTION:  If longer admission expected, Recommend Changing to continuous TF. Osmolite 1.5 @ 50 ml/hr via PEG. No titration necessary as she is chronically Tube fed.    Tube feeding regimen provides 1800 kcal (100% of needs), 75 grams of protein, and 914 ml of H2O.   NUTRITION DIAGNOSIS:  Increased nutrient needs related to acute illness/PNA as evidenced by estimated requirements for the condition  GOAL:  Patient will meet greater than or equal to 90% of their needs  MONITOR:  TF tolerance, I & O's, Labs, Diet advancement  REASON FOR ASSESSMENT:  Home Tube feeder   ASSESSMENT:  69 y/o female PMHx HLD, obesity, HTN, Renal Carcinoma, CKD, Multiple Myeloma and stroke x2 who is nonverbal and fed via PEG tube. Presents with fever x3 days and vomiting. Eval shows Suspected aspiration PNA.Marland Kitchen Swallow eval to be performed  Home TF regimen: 5 cans osmolite 1.5 daily. Flush 100 ml water q 4 hours Provides: 1778 kcals, 74 g Pro, 903 ml fluid + 600 ml flushes  Family members report that pt receives 5 1-can feeds each day. She had no issues with tolerance until these past 2 days. Family members did not check residuals. Educated family members on how residuals are sometimes checked to assess tolerance, but this is becoming less standard due to other potential reasons for high residuals. Pt used to be able to tolerate pureed foods and was drinking but recently she has stopped accepting oral intake.   Pt still having normal bowel movements. No chronic n/v/c/d.   Appears to have had major weight loss this year. Family members report that she has not been appropriately weighed in some time. The 240 lbs that was reports in May/April was carried over from when pt had first stroke in September 2015. This is still a significant weight loss of 21.5% bw in the past year. Family member believe she has just slowly lost weight due to  all the readmissions.   Family member had question regarding difference between formulas. They still have a sizeable amount of Osmolite 1.2 at home and I calculated how many cans they would need of that formula for pt to meet same calories that are met with the 1.5 formula.   Diet Order:  Diet NPO time specified Except for: Sips with Meds  Skin:  Reviewed, no issues  Last BM:  9/13  Height:  Ht Readings from Last 1 Encounters:  09/04/15 _0  (1.676 m)   Weight:  Wt Readings from Last 1 Encounters:  09/04/15 189 lb (85.73 kg)   Wt Readings from Last 10 Encounters:  09/04/15 189 lb (85.73 kg)  08/03/15 192 lb 1.6 oz (87.136 kg)  04/25/15 240 lb (108.863 kg)  04/13/15 240 lb (108.863 kg)  11/06/14 240 lb (108.863 kg)  10/01/14 240 lb 15.4 oz (109.3 kg)  09/15/14 245 lb 3.2 oz (111.222 kg)  09/12/14 246 lb 6.4 oz (111.766 kg)  09/08/14 247 lb (112.038 kg)  09/05/14 244 lb 6.4 oz (110.859 kg)   Ideal Body Weight:  59.1 kg  BMI:  Body mass index is 30.52 kg/(m^2).  Estimated Nutritional Needs:  Kcal:  1650-1800 kcals (19-21 kcal/kg) Protein:  65-77 g (1.1-1.3 g/kg IBW) Fluid:  1.7-1.8 liters fluid  EDUCATION NEEDS:  No education needs identified at this time  Burtis Junes RD, LDN Nutrition Pager: [phone] 09/05/2015 12:01 PM

## 2015-09-05 NOTE — Progress Notes (Signed)
Temperature 101.3.  Tylenol 650mg  crushed and given via PEG.  Will continue to monitor

## 2015-09-05 NOTE — Care Management Note (Signed)
Case Management Note  Patient Details  Name: Khrista Braun Rhew MRN: [id number] Date of Birth: September 15, 1946   Expected Discharge Date:  09/06/15               Expected Discharge Plan:  [place]  In-House Referral:  NA  Discharge planning Services  CM Consult  Post Acute Care Choice:  Resumption of Svcs/PTA Provider Choice offered to:  [facility] POA / Guardian, Adult Children  DME Arranged:    DME Agency:     HH Arranged:  RN [place] Agency:  [facility]  Status of Service:  In process, will continue to follow  Medicare Important Message Given:    Date Medicare IM Given:    Medicare IM give by:    Date Additional Medicare IM Given:    Additional Medicare Important Message give by:     If discussed at [place] of Stay Meetings, dates discussed:    Additional Comments: Pt is from home and recently DC'd from [facility]. Pt was order [facility] through [facility] but was not home long enough for services to begin. Romualdo Bolk, of [facility] made aware and will obtain pt info from chart. If pt returns home at DC will need [facility] ordered again. Pt's daughter "Jonelle Sidle" very involved, says she would like to re-address DNR status with MD when he rounds today. MD made aware. Family agreeable for her to go back to SNF for short time if that is what is recommended but they eventually want pt at home so they can provide care. Will cont to follow for DC planning.  Sherald Barge, RN 09/05/2015, 1:51 PM

## 2015-09-05 NOTE — Progress Notes (Signed)
[facility] PROGRESS NOTE  Melissa [name] OZH:[id number] DOB: 1946/11/10 DOA: 09/04/2015 PCP: [name], MD  Summary: 32 yof with a hx of prior stroke who is nonverbal, PEG tube dependent, and has right sided hemiplegia presented with aspiration PNA. Started on broad spectrum abx. Blood cultures pending. PICC has been placed due to lack of venous access. Neurology will evaluate patient due to hx of seizures and medication concerns expressed by family members. All other issues remain stable.   Assessment/Plan: 1. Aspiration pneumonia. CT chest  revealed new possible minimal dependent aspirate material over the right lower lobe bronchus. She has been started on broad spectrum abx. Blood cultures pending. Patient is febrile, Tmax 101.5F. Lactic acid WNL. WBC elevated at 13.3. Will place PICC line due to lack of venous access.  2. Dehydration, secondary to FTT in the setting of PNA. Will give IVF.   3. Hyponatremia, secondary to dehydration. Will replete and recheck in AM. 4. Cerebrovascular disease. There is no evidence of a new stroke. Residual right sided paralysis and aphasia.  5. History of seizures. Continue Keppra, Dilantin, and Vimpat. Will consult neurology and check levels.   6. Elevated LFTs, possibly related to medications vs fatty liver. Will check abdominal US. 7. Systolic dysfunction without heart failure. Currently compensated. 8. Vomiting. No further vomiting since admission. Will give a trial of reglan. Check residuals after bolus feeding  Code Status: DNR DVT prophylaxis: Lovenox  Family Communication: Family members at bedside. Discussed with them care plan. They express understanding.  Disposition Plan: Anticipate discharge in 1-2 days.   Consultants:  none  Procedures:  none  Antibiotics:  Vancomycin 9/13 >>  Zosyn 9/12 >>  HPI/Subjective: Patient aphasic at baseline, unable to obtain history from her.   Family members at bedside. They report  patient  has appeared more lethargic in the last week, not responding to her name which is different from her baseline. Family states patient is currently febrile.    Objective: Filed Vitals:   09/05/15 0624  BP: 146/82  Pulse: 93  Temp: 98.5 F (36.9 C)  Resp: 18    Intake/Output Summary (Last 24 hours) at 09/05/15 0720 Last data filed at 09/05/15 [id number]  Gross per 24 hour  Intake      0 ml  Output      0 ml  Net      0 ml   Filed Weights   09/04/15 1202  Weight: 85.73 kg (189 lb)    Exam: General: NAD, looks comfortable Cardiovascular: RRR, S1, S2  Respiratory:No wheezing or rhonchi. Crackles at the bases Abdomen: soft, non tender, no distention , bowel sounds normal Musculoskeletal: No edema b/l  Data Reviewed: Basic Metabolic Panel:  Recent Labs Lab 09/04/15 1250  NA 130*  K 4.0  CL 96*  CO2 29  GLUCOSE 124*  BUN 30*  CREATININE 0.80  CALCIUM 8.6*   Liver Function Tests:  Recent Labs Lab 09/04/15 1250  AST 142*  ALT 201*  ALKPHOS 69  BILITOT 0.7  PROT 7.9  ALBUMIN 2.7*    Recent Labs Lab 09/04/15 1250  LIPASE 12*   No results for input(s): AMMONIA in the last 168 hours. CBC:  Recent Labs Lab 09/04/15 1250  WBC 13.3*  NEUTROABS 9.7*  HGB 12.8  HCT 37.4  MCV 91.2  PLT 210    ProBNP (last 3 results)  Recent Labs  09/19/14 1004  PROBNP 1843.0*    CBG: No results for input(s): GLUCAP in the last 168  hours.  No results found for this or any previous visit (from the past 240 hour(s)).   Studies: Ct Chest W Contrast  09/04/2015   CLINICAL DATA:  Abnormal chest x-ray with left lower lobe opacity.  EXAM: CT CHEST WITH CONTRAST  TECHNIQUE: Multidetector CT imaging of the chest was performed during intravenous contrast administration.  CONTRAST:  75mL OMNIPAQUE IOHEXOL 300 MG/ML  SOLN  COMPARISON:  CT 09/20/2014, 05/19/2014 and 05/11/2013.  FINDINGS: Lungs are hypoinflated and demonstrate evidence of known bibasilar pulmonary  fibrosis without significant change from 05/11/2014. Mild traction bronchiectasis left lower lobe. No focal airspace process. Previously noted 4 mm nodule over the medial left upper lobe not well visualized. Possible minimal dependent aspirate material within the right lower lobe bronchus.  There is mild stable cardiomegaly. There is calcified plaque over the left main and left anterior descending coronary arteries. Mild calcified plaque over the thoracic aorta. No significant mediastinal, hilar or axillary adenopathy. Mild heterogeneity with calcification over the thyroid gland.  Images through the upper abdomen demonstrate a percutaneous gastrostomy tube in adequate position. Absent right kidney. Minimal calcified plaque over the abdominal aorta. There are mild degenerate changes of the spine.  IMPRESSION: Hypoinflation with stable pulmonary fibrosis. No acute airspace process. Possible minimal dependent aspirate material over the right lower lobe bronchus.  Stable cardiomegaly.  Atherosclerotic coronary artery disease.  Percutaneous gastrostomy tube in adequate position.  Absent right kidney.   Electronically Signed   By: Elberta Fortis M.D.   On: 09/04/2015 16:29   Dg Chest Portable 1 View  09/04/2015   CLINICAL DATA:  Fever, vomiting and lethargy x 2 days. Hx of stroke last Sept and was admitted this August for seizures. Hx htn. Pt unable to sit up or hold head up or follow breathing instructions.  EXAM: PORTABLE CHEST - 1 VIEW  COMPARISON:  08/01/2015  FINDINGS: Low lung volumes are present, causing crowding of the pulmonary vasculature. Mild to moderate enlargement of the cardiopericardial silhouette obscuration of the left hemidiaphragm compatible with left lower lobe airspace opacity. Retro diaphragmatic density in the posterior basal segment right lower lobe.  IMPRESSION: 1. Airspace opacity in the left lower lobe persists. This could represent pneumonia. If the patient has an explanation for chronic  core recurring pneumonia such as aspiration then this may not necessarily require further investigation, but otherwise, chest CT should be considered to exclude an underlying mass or other underlying pathologic process as a cause for chronic/recurring pneumonia. 2. Mild to moderate cardiomegaly. 3. There is potentially some retro diaphragmatic airspace opacity on the right, making this potentially multilobar pneumonia.   Electronically Signed   By: Gaylyn Rong M.D.   On: 09/04/2015 12:57    Scheduled Meds: . amLODipine  10 mg Oral Daily  . budesonide-formoterol  2 puff Inhalation QHS  . carvedilol  6.25 mg Per Tube BID WC  . [START ON 09/08/2015] cloNIDine  0.3 mg Transdermal Weekly  . clopidogrel  75 mg Oral Daily  . DULoxetine  30 mg Oral BID  . enoxaparin (LOVENOX) injection  40 mg Subcutaneous Q24H  . feeding supplement (OSMOLITE 1.5 CAL)  237 mL Per Tube 5 X Daily  . feeding supplement (PRO-STAT SUGAR FREE 64)  30 mL Oral Q24H  . free water  100 mL Per Tube 6 times per day  . labetalol  200 mg Oral BID  . lacosamide  150 mg Oral BID  . levETIRAcetam  1,000 mg Oral BID  . levothyroxine  75  mcg Oral QAC breakfast  . phenytoin  100 mg Oral TID  . piperacillin-tazobactam (ZOSYN)  IV  3.375 g Intravenous Q8H  . pravastatin  40 mg Oral q1800  . vancomycin  1,000 mg Intravenous Q12H   Continuous Infusions: . sodium chloride      Active Problems:   CVA (cerebral infarction)   Systolic dysfunction without heart failure   CKD (chronic kidney disease)   Fever and chills   Aspiration pneumonia    Time spent:     Erick Blinks, MD  [facility] Pager [phone]. If 7PM-7AM, please contact night-coverage at [email or web address], password [facility] 09/05/2015, 7:20 AM  LOS: 1 day     By signing my name below, I, Zadie Cleverly, attest that this documentation has been prepared under the direction and in the presence of Erick Blinks, MD. Electronically signed: Zadie Cleverly, Scribe. 09/05/2015  I have reviewed the above documentation for accuracy and completeness, and I agree with the above.  Sula Fetterly

## 2015-09-05 NOTE — Progress Notes (Signed)
Lab attempted to get blood, but was unsuccessful. Will continue to monitor

## 2015-09-06 ENCOUNTER — Inpatient Hospital Stay (HOSPITAL_COMMUNITY): Payer: Medicare Other

## 2015-09-06 DIAGNOSIS — I639 Cerebral infarction, unspecified: Secondary | ICD-10-CM

## 2015-09-06 DIAGNOSIS — R7989 Other specified abnormal findings of blood chemistry: Secondary | ICD-10-CM

## 2015-09-06 DIAGNOSIS — Z931 Gastrostomy status: Secondary | ICD-10-CM

## 2015-09-06 DIAGNOSIS — J690 Pneumonitis due to inhalation of food and vomit: Principal | ICD-10-CM

## 2015-09-06 DIAGNOSIS — G40909 Epilepsy, unspecified, not intractable, without status epilepticus: Secondary | ICD-10-CM

## 2015-09-06 DIAGNOSIS — E860 Dehydration: Secondary | ICD-10-CM

## 2015-09-06 LAB — BASIC METABOLIC PANEL
ANION GAP: 6 (ref 5–15)
BUN: 20 mg/dL (ref 6–20)
CALCIUM: 8.3 mg/dL — AB (ref 8.9–10.3)
CO2: 27 mmol/L (ref 22–32)
Chloride: 102 mmol/L (ref 101–111)
Creatinine, Ser: 0.7 mg/dL (ref 0.44–1.00)
GLUCOSE: 114 mg/dL — AB (ref 65–99)
POTASSIUM: 3.6 mmol/L (ref 3.5–5.1)
Sodium: 135 mmol/L (ref 135–145)

## 2015-09-06 LAB — CBC
HEMATOCRIT: 30.2 % — AB (ref 36.0–46.0)
HEMOGLOBIN: 10.7 g/dL — AB (ref 12.0–15.0)
MCH: 31.8 pg (ref 26.0–34.0)
MCHC: 35.4 g/dL (ref 30.0–36.0)
MCV: 89.9 fL (ref 78.0–100.0)
Platelets: 204 10*3/uL (ref 150–400)
RBC: 3.36 MIL/uL — AB (ref 3.87–5.11)
RDW: 17 % — ABNORMAL HIGH (ref 11.5–15.5)
WBC: 8.8 10*3/uL (ref 4.0–10.5)

## 2015-09-06 LAB — URINE CULTURE: CULTURE: NO GROWTH

## 2015-09-06 MED ORDER — ONDANSETRON HCL 4 MG PO TABS: 4.0000 mg | ORAL_TABLET | Freq: Four times a day (QID) | ORAL | Status: DC | PRN

## 2015-09-06 MED ORDER — ONDANSETRON HCL 4 MG/2ML IJ SOLN: 4.0000 mg | Freq: Four times a day (QID) | INTRAMUSCULAR | Status: DC | PRN

## 2015-09-06 MED ORDER — HYDROCODONE-ACETAMINOPHEN 5-325 MG PO TABS: 1.0000 | ORAL_TABLET | Freq: Two times a day (BID) | ORAL | Status: DC | PRN

## 2015-09-06 MED ORDER — PRAVASTATIN SODIUM 40 MG PO TABS
40.0000 mg | ORAL_TABLET | Freq: Every day | ORAL | Status: DC
  Administered 2015-09-07 – 2015-09-08 (×2): 40 mg
  Filled 2015-09-06 (×2): qty 1

## 2015-09-06 MED ORDER — PRO-STAT SUGAR FREE PO LIQD
30.0000 mL | ORAL | Status: DC
  Administered 2015-09-07 – 2015-09-08 (×2): 30 mL
  Filled 2015-09-06 (×2): qty 30

## 2015-09-06 MED ORDER — LABETALOL HCL 200 MG PO TABS
200.0000 mg | ORAL_TABLET | Freq: Two times a day (BID) | ORAL | Status: DC
  Administered 2015-09-06 – 2015-09-09 (×6): 200 mg
  Filled 2015-09-06 (×6): qty 1

## 2015-09-06 MED ORDER — AMLODIPINE BESYLATE 5 MG PO TABS
10.0000 mg | ORAL_TABLET | Freq: Every day | ORAL | Status: DC
  Administered 2015-09-07 – 2015-09-09 (×3): 10 mg
  Filled 2015-09-06 (×3): qty 2

## 2015-09-06 MED ORDER — LACOSAMIDE 50 MG PO TABS
150.0000 mg | ORAL_TABLET | Freq: Two times a day (BID) | ORAL | Status: DC
  Administered 2015-09-06 – 2015-09-09 (×6): 150 mg
  Filled 2015-09-06 (×6): qty 3

## 2015-09-06 MED ORDER — PHENYTOIN 125 MG/5ML PO SUSP
100.0000 mg | Freq: Three times a day (TID) | ORAL | Status: DC
  Administered 2015-09-06 – 2015-09-07 (×2): 100 mg
  Filled 2015-09-06 (×5): qty 4

## 2015-09-06 MED ORDER — CLOPIDOGREL BISULFATE 75 MG PO TABS
75.0000 mg | ORAL_TABLET | Freq: Every day | ORAL | Status: DC
  Administered 2015-09-07 – 2015-09-09 (×3): 75 mg
  Filled 2015-09-06 (×3): qty 1

## 2015-09-06 MED ORDER — LEVETIRACETAM 100 MG/ML PO SOLN
1000.0000 mg | Freq: Two times a day (BID) | ORAL | Status: DC
  Administered 2015-09-06 – 2015-09-07 (×3): 1000 mg
  Filled 2015-09-06 (×10): qty 10

## 2015-09-06 MED ORDER — LEVOTHYROXINE SODIUM 75 MCG PO TABS
75.0000 ug | ORAL_TABLET | Freq: Every day | ORAL | Status: DC
  Administered 2015-09-07 – 2015-09-09 (×3): 75 ug
  Filled 2015-09-06 (×3): qty 1

## 2015-09-06 NOTE — Consult Note (Signed)
Melissa Park A. Merlene Laughter, MD     [email or web address]          Melissa [name] is an 69 y.o. female.   ASSESSMENT/PLAN: 1. Vascular dementia at baseline with worsening due to acute infection. 2. Epilepsy on 3 drug regimen. The epilepsy is due to large cortical infarct. 3. 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 It's unclear this post lesional phenomena is epileptic or nonepileptic. 4. Elevated liver enzymes with concern of Dilantin causing this. She also has been on Tylenol which can also be hepatotoxic.  RECOMMENDATION: Repeat liver enzymes. We likely will consider weaning off the Dilantin gradually. We'll continue with the other 2 seizure medications. The patient is a 69 year old black female who unfortunately had a large left MCA infarct 2015 which left her with severe global aphasia and left hemiplegia. The patient has had episodes of seizures thought to be due to post lesional phenomenon from her large cortical infarct. She also has had episodes of myoclonus which is not entirely clear that these myoclonic events are epileptic. She is on 3 drug regimen for seizure control. Patient has had elevated liver enzymes recently which has raised concern that this could be due to the effects of Dilantin. She also has had fever over the last week which is reason she was admitted to the hospital. She has been diagnosed with pneumonia. She has been given Tylenol for these fevers. Review of systems is limited due to the severe aphasia but the family reports no other complaints at this time.  GENERAL: Obese female in no acute distress.  HEENT: Supple. Atraumatic normocephalic. Hyperpigmented area and right facial region which I think is due to birth mark.  ABDOMEN: soft  EXTREMITIES: No edema   BACK: Normal.  SKIN: Normal by inspection.    MENTAL STATUS: She is awake and alert but globally aphasic. There is no  verbal output. She does not follow commands. She does focuses and tracks however.  CRANIAL NERVES: Pupils are equal, round and reactive to light; extra ocular movements are full, there is no significant nystagmus; visual fields - limited due to aphasia; upper and lower facial muscles are normal in strength and symmetric, there is no flattening of the nasolabial folds.  MOTOR: Right hemiplegia. Left side is antigravity strength.  COORDINATION: There is no tremors or dysmetria observed.  REFLEXES: Deep tendon reflexes are symmetrical and normal.  SENSATION: Normal to deep painful stimuli.    Blood pressure 172/69, pulse 85, temperature 100 F (37.8 C), temperature source Axillary, resp. rate 20, height $RemoveBe'5\' 6"'DNhnrPVhT$  (1.676 m), weight 85.73 kg (189 lb), SpO2 97 %.  Past Medical History  Diagnosis Date  . Vertigo, intermittent   . Hypothyroidism   . Hyperlipidemia   . Obesity   . Hypertension     severe and resistant to treatment; 2008-negative left renal angiogram  . Vitiligo   . Degenerative disc disease, lumbar   . Degenerative disc disease, cervical   . Chest pain 2008    2008-normal coronary angiography  . 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 Renal cell carcinoma     Right nephrectomy  . Multiple myeloma 08/26/2014    Past Surgical History  Procedure Laterality Date  . Tubal ligation  1983    Bilateral  . Nephrectomy      Right; secondary to renal cell carcinoma  . Colonoscopy N/A 04/21/2014    Procedure: COLONOSCOPY;  Surgeon: Rogene Houston,  MD;  Location: [facility];  Service: Endoscopy;  Laterality: N/A;  . Colonoscopy N/A 04/21/2014    Procedure: COLONOSCOPY;  Surgeon: Rogene Houston, MD;  Location: [facility];  Service: Endoscopy;  Laterality: N/A;  1030  . Tee without cardioversion N/A 09/23/2014    Procedure: TRANSESOPHAGEAL ECHOCARDIOGRAM (TEE);  Surgeon: Sanda Klein, MD;  Location: [facility] ENDOSCOPY;  Service: Cardiovascular;  Laterality: N/A;    Family  History  Problem Relation Age of Onset  . Heart disease Mother 56  . Hypertension Mother   . Colon cancer Father   . Hypertension Sister     x2  . Diabetes Sister     x2  . Heart failure Sister   . Lupus Brother     Social History:  reports that she has never smoked. She has never used smokeless tobacco. She reports that she does not drink alcohol or use illicit drugs.  Allergies:  Allergies  Allergen Reactions  . Morphine Nausea And Vomiting    Medications: Prior to Admission medications   Medication Sig Start Date End Date Taking? Authorizing Provider  acetaminophen (TYLENOL) 650 MG CR tablet 650 mg by Feeding Tube route every 8 (eight) hours as needed for pain.    Yes Historical Provider, MD  albuterol (PROVENTIL) (2.5 MG/3ML) 0.083% nebulizer solution Take 2.5 mg by nebulization every 6 (six) hours as needed for wheezing or shortness of breath.   Yes Historical Provider, MD  Amino Acids-Protein Hydrolys (FEEDING SUPPLEMENT, PRO-STAT SUGAR FREE 64,) LIQD Take 30 mLs by mouth daily. Patient taking differently: Take 30 mLs by mouth daily.  10/01/14  Yes Ripudeep Krystal Eaton, MD  amLODipine (NORVASC) 10 MG tablet Take 10 mg by mouth daily.   Yes Historical Provider, MD  budesonide-formoterol (SYMBICORT) 160-4.5 MCG/ACT inhaler Inhale 2 puffs into the lungs at bedtime.    Yes Historical Provider, MD  carvedilol (COREG) 6.25 MG tablet TAKE ONE TABLET INTO FEEDING TUBE TWICE DAILY WITH MEALS 06/29/15  Yes Fayrene Helper, MD  Cholecalciferol (VITAMIN D3) 50000 UNITS TABS Take 1 tablet by mouth once a week. By feeding tube Patient not taking: Reported on 09/04/2015 04/05/15   Fayrene Helper, MD  clonazePAM (KLONOPIN) 0.5 MG tablet Place 1 tablet (0.5 mg total) into feeding tube 3 (three) times daily as needed. Patient taking differently: Place 0.5 mg into feeding tube 3 (three) times daily as needed for anxiety.  08/03/15  Yes Delfina Redwood, MD  cloNIDine (CATAPRES - DOSED IN MG/24  HR) 0.3 mg/24hr patch APPLY 1 PATCH TOPICALLY ONCE A WEEK 07/11/15  Yes Fayrene Helper, MD  clopidogrel (PLAVIX) 75 MG tablet Take 1 tablet (75 mg total) by mouth daily. 08/03/15  Yes Delfina Redwood, MD  DULoxetine (CYMBALTA) 30 MG capsule 30 mg by Feeding Tube route 2 (two) times daily.  05/23/15  Yes Historical Provider, MD  HYDROcodone-acetaminophen (NORCO/VICODIN) 5-325 MG per tablet Take 1 tablet by mouth 2 (two) times daily as needed for moderate pain. Patient taking differently: 1 tablet by Feeding Tube route 2 (two) times daily as needed for moderate pain.  08/03/15  Yes Delfina Redwood, MD  labetalol (NORMODYNE) 200 MG tablet 200 mg by Feeding Tube route 2 (two) times daily.    Yes Historical Provider, MD  Lacosamide 150 MG TABS Take 1 tablet by mouth 2 (two) times daily. X 14 days   Yes Historical Provider, MD  levETIRAcetam (KEPPRA) 100 MG/ML solution Take 10 mLs (1,000 mg total) by mouth  2 (two) times daily. 08/03/15  Yes Delfina Redwood, MD  levothyroxine (SYNTHROID, LEVOTHROID) 75 MCG tablet One tablet mon-fri and one half tab on sat and sun Patient taking differently: Take 37.5-75 mcg by mouth daily before breakfast. take one half tablet sat and sun and 1 tab mon- fri 04/05/15  Yes Fayrene Helper, MD  lovastatin (MEVACOR) 40 MG tablet Place 1 tablet (40 mg total) into feeding tube at bedtime. 10/01/14  Yes Ripudeep Krystal Eaton, MD  Nutritional Supplements (FEEDING SUPPLEMENT, OSMOLITE 1.5 CAL,) LIQD Place 237 mLs into feeding tube 5 (five) times daily. 08/03/15  Yes Delfina Redwood, MD  phenytoin (DILANTIN) 125 MG/5ML suspension Take 4 mLs (100 mg total) by mouth 3 (three) times daily. 08/03/15  Yes Delfina Redwood, MD  prochlorperazine (COMPAZINE) 10 MG tablet 10 mg by Feeding Tube route every 6 (six) hours as needed for nausea or vomiting.     Historical Provider, MD  vancomycin (VANCOCIN) 50 mg/mL oral solution Place 2.5 mLs (125 mg total) into feeding tube 4 (four) times  daily. Through 08/14/15, then stop Patient not taking: Reported on 09/04/2015 08/03/15   Delfina Redwood, MD  Vitamin D, Ergocalciferol, (DRISDOL) 50000 UNITS CAPS capsule Take 50,000 Units by mouth every 7 (seven) days.   Yes Historical Provider, MD  Water For Irrigation, Sterile (FREE WATER) SOLN Place 100 mLs into feeding tube every 4 (four) hours. 10/01/14  Yes Ripudeep Krystal Eaton, MD    Scheduled Meds: . [START ON 09/07/2015] amLODipine  10 mg Per Tube Daily  . budesonide-formoterol  2 puff Inhalation QHS  . carvedilol  6.25 mg Per Tube BID WC  . [START ON 09/08/2015] cloNIDine  0.3 mg Transdermal Weekly  . [START ON 09/07/2015] clopidogrel  75 mg Per Tube Daily  . enoxaparin (LOVENOX) injection  40 mg Subcutaneous Q24H  . feeding supplement (OSMOLITE 1.5 CAL)  237 mL Per Tube 5 X Daily  . [START ON 09/07/2015] feeding supplement (PRO-STAT SUGAR FREE 64)  30 mL Per Tube Q24H  . free water  100 mL Per Tube 6 times per day  . labetalol  200 mg Per Tube BID  . lacosamide  150 mg Per Tube BID  . levETIRAcetam  1,000 mg Per Tube BID  . [START ON 09/07/2015] levothyroxine  75 mcg Per Tube QAC breakfast  . phenytoin  100 mg Per Tube TID  . piperacillin-tazobactam (ZOSYN)  IV  3.375 g Intravenous Q8H  . [START ON 09/07/2015] pravastatin  40 mg Per Tube q1800  . vancomycin  1,000 mg Intravenous Q12H   Continuous Infusions:  PRN Meds:.acetaminophen, albuterol, clonazePAM, HYDROcodone-acetaminophen, ondansetron **OR** ondansetron (ZOFRAN) IV, prochlorperazine     Results for orders placed or performed during the hospital encounter of 09/04/15 (from the past 48 hour(s))  Basic metabolic panel     Status: Abnormal   Collection Time: 09/06/15  5:49 AM  Result Value Ref Range   Sodium 135 135 - 145 mmol/L   Potassium 3.6 3.5 - 5.1 mmol/L   Chloride 102 101 - 111 mmol/L   CO2 27 22 - 32 mmol/L   Glucose, Bld 114 (H) 65 - 99 mg/dL   BUN 20 6 - 20 mg/dL   Creatinine, Ser 0.70 0.44 - 1.00 mg/dL    Calcium 8.3 (L) 8.9 - 10.3 mg/dL   GFR calc non Af Amer >60 >60 mL/min   GFR calc Af Amer >60 >60 mL/min    Comment: (NOTE) The eGFR has been calculated  using the CKD EPI equation. This calculation has not been validated in all clinical situations. eGFR's persistently <60 mL/min signify possible Chronic Kidney Disease.    Anion gap 6 5 - 15  CBC     Status: Abnormal   Collection Time: 09/06/15  5:49 AM  Result Value Ref Range   WBC 8.8 4.0 - 10.5 K/uL   RBC 3.36 (L) 3.87 - 5.11 MIL/uL   Hemoglobin 10.7 (L) 12.0 - 15.0 g/dL   HCT 30.2 (L) 36.0 - 46.0 %   MCV 89.9 78.0 - 100.0 fL   MCH 31.8 26.0 - 34.0 pg   MCHC 35.4 30.0 - 36.0 g/dL   RDW 17.0 (H) 11.5 - 15.5 %   Platelets 204 150 - 400 K/uL    Studies/Results:     Jeidi Gilles A. Merlene [name], M.D.  Diplomate, Tax adviser of Psychiatry and Neurology ( Neurology). 09/06/2015, 10:30 PM

## 2015-09-06 NOTE — Progress Notes (Signed)
PROGRESS NOTE  Melissa [name] ZOX:[id number] DOB: 1946-09-26 DOA: 09/04/2015 PCP: [name], MD  Summary: 57 yof with a hx of prior stroke who is nonverbal, PEG tube dependent, and has right sided hemiplegia presented with aspiration PNA confirmed from CXR. Pt was admitted for further management.  Assessment/Plan: 1. Aspiration pneumonia. CT chest revealed new possible minimal dependent aspirate material over the right lower lobe bronchus. Still febrile. Blood cultures pending. Lactic acid WNL. WBC trended down to 8.8.  2. Dehydration, secondary to FTT in the setting of PNA. Repleted with IVF. 3. Hyponatremia, secondary to dehydration. Resolved. 4. Cerebrovascular disease, s/p stroke with residual right hemiparesis, PEG tube dependent, functional quadraplegia. 5. Seizure disorder. Will continue Keppra, Dilantin, and Vimpat. Await neurology recommendations.  6. Elevated LFTs, possibly related to medications vs fatty liver. Abdominal u/s was unremarkable. Consider statin or Dilantin 7. Chronic systolic dysfunction. Currently compensated. 8. Vomiting. Pt no longer has vomiting, improved with Reglan.               Overall some what improved, although still febrile. Chronically ill.  Continue abx. CMP in AM.  Temporarily hold statin .   Code Status: DNR DVT prophylaxis: Lovenox  Family Communication: Daughters at bedside. Discussed with them care plan. They express understanding.  Disposition Plan: Anticipate discharge within 2-3 days.  Brendia Sacks, MD  [facility]  Pager [phone] If 7PM-7AM, please contact night-coverage at [email or web address], password [facility] 09/06/2015, 8:33 AM  LOS: 2 days   Consultants:  PT    Procedures:    Antibiotics:  Vancomycin 9/13 >>  Zosyn 9/12 >>  HPI/Subjective: Per daughters at bedside, patient is better today, more alert and interactive, making eye contact, appears better to daughters today.   Objective: Filed Vitals:   09/05/15 1511 09/05/15 2021 09/05/15 2214 09/06/15 0630  BP: 113/40 180/57  158/84  Pulse: 82 95  94  Temp: 99.9 F (37.7 C) 98.4 F (36.9 C) 101.5 F (38.6 C) 100.1 F (37.8 C)  TempSrc: Oral Oral Axillary Axillary  Resp: 18 20  22   Height:      Weight:      SpO2: 100% 100%  96%    Intake/Output Summary (Last 24 hours) at 09/06/15 0833 Last data filed at 09/05/15 1707  Gross per 24 hour  Intake    674 ml  Output      0 ml  Net    674 ml     Filed Weights   09/04/15 1202  Weight: 85.73 kg (189 lb)    Exam: Afebrile, VSS General: Appears calm and comfortable Eyes: PERRL, normal lids, irises & conjunctiva  ENT: Limited. Lips unremarkable Cardiovascular: RRR, no m/r/g. No LE edema. Respiratory: CTA bilaterally, no w/r/r. Normal respiratory effort. Abdomen: soft, ntnd. PEG in place, site looks clean and dry. Skin: no rash or induration  Musculoskeletal: right hemiparesis Psychiatric: unable to assess, patient does not respond verbally  New data reviewed:  BMP unremarkable  WBC/RBC 8.8/3.6  Hgb 10.7  US Abdomen Impression Mild prominence of the common bile duct without mass or calculus appreciable on this study. No gallbladder pathology demonstrable. No focal liver lesions appreciable. Right kidney absent.  Pertinent data since admission:  CBC  BMET  CT Chest Impresson -Hypoinflation with stable pulmonary fibrosis. No acute airspace process. Possible minimal dependent aspirate material over the right lower lobe bronchus. -Stable cardiomegaly. -Atherosclerotic coronary artery disease. -Percutaneous gastrostomy tube in adequate position. -Absent right kidney.  Chest XR Impression 1. Airspace opacity in the  left lower lobe persists. This could represent pneumonia. If the patient has an explanation for chronic core recurring pneumonia such as aspiration then this may not necessarily require further investigation, but otherwise, chest CT should be  considered to exclude an underlying mass or other underlying pathologic process as a cause for chronic/recurring pneumonia. 2. Mild to moderate cardiomegaly. 3. There is potentially some retro diaphragmatic airspace opacity on the right, making this potentially multilobar pneumonia.  Pending data:    Scheduled Meds: . amLODipine  10 mg Oral Daily  . budesonide-formoterol  2 puff Inhalation QHS  . carvedilol  6.25 mg Per Tube BID WC  . [START ON 09/08/2015] cloNIDine  0.3 mg Transdermal Weekly  . clopidogrel  75 mg Oral Daily  . DULoxetine  30 mg Oral BID  . enoxaparin (LOVENOX) injection  40 mg Subcutaneous Q24H  . feeding supplement (OSMOLITE 1.5 CAL)  237 mL Per Tube 5 X Daily  . feeding supplement (PRO-STAT SUGAR FREE 64)  30 mL Oral Q24H  . free water  100 mL Per Tube 6 times per day  . labetalol  200 mg Oral BID  . lacosamide  150 mg Oral BID  . levETIRAcetam  1,000 mg Oral BID  . levothyroxine  75 mcg Oral QAC breakfast  . metoCLOPramide (REGLAN) injection  5 mg Intravenous 4 times per day  . phenytoin  100 mg Oral TID  . piperacillin-tazobactam (ZOSYN)  IV  3.375 g Intravenous Q8H  . pravastatin  40 mg Oral q1800  . vancomycin  1,000 mg Intravenous Q12H   Continuous Infusions: . sodium chloride      Principal Problem:   Aspiration pneumonia Active Problems:   CVA (cerebral infarction)   Elevated LFTs   HTN (hypertension)   Seizure disorder   PEG (percutaneous endoscopic gastrostomy) status   Dehydration   Time spent 35 minutes  By signing my name below, I, Edman Circle attest that this documentation has been prepared under the direction and in the presence of Dr. Brendia Sacks, M.D.   Electronically signed: Edman Circle  09/06/2015    I have reviewed the above documentation for accuracy and completeness, and I agree with the above. Brendia Sacks, MD

## 2015-09-06 NOTE — Evaluation (Signed)
Physical Therapy Evaluation Patient Details Name: Melissa [name] MRN: [id number] DOB: 07-Jan-1946 Today's Date: 09/06/2015   History of Present Illness  This is a 69 year old lady who has a past medical history of cerebrovascular disease, having suffered 2 strokes and who is since nonverbal, presents with fever for the last 3 days associated with few episodes of vomiting without hematemesis. She is fed from a PEG tube. Patient has not had a cough, abdominal pain or diarrhea. The patient is nonverbal from her previous strokes and at the present time is somewhat drowsy and unable to give me any clear history. There are no family members present at the bedside at the present time. Evaluation in the emergency room is suggestive of aspiration pneumonia based on chest x-ray findings. She is now being admitted for further management.  Clinical Impression  Melissa [name] had been assisting with transfers after her CVA but was not ambulatory.  A month ago she, unfortunately has seizures which left her bed ridden.  She had 20 days of SNF with minimal improvement and was discharged to home a few days ago.  Melissa [name] does not respond to command or assist with any motion during the evaluation.  At this time she is not a good candidate for skilled physical therapy.  If she begins to respond more we would be happy to re-evaluate.     Follow Up Recommendations No PT follow up    Equipment Recommendations    none- daughter has hoyer    Recommendations for Other Services   none    Precautions / Restrictions Precautions Precautions: Fall Restrictions Weight Bearing Restrictions: No      Mobility  Bed Mobility Overal bed mobility: Needs Assistance             General bed mobility comments: max assist   Transfers Overall transfer level: Needs assistance               General transfer comment: PT family has been using hoyer lift at home. Pt does not assist with transfers since seizures a month  ago  Ambulation/Gait  N/A                        Pertinent Vitals/Pain Pain Assessment: Faces Pain Score: 4  Faces Pain Scale:  (4/10 using face scale with motin of Rt side.) Pain Intervention(s): Limited activity within patient's tolerance    Home Living Family/patient expects to be discharged to:: Unsure Living Arrangements: Children               Additional Comments: Family extremely supportive,     Prior Function Level of Independence: Needs assistance (Pt has been bed bound since her seizures over a month ago. )               Hand Dominance   Dominant Hand: Right    Extremity/Trunk Assessment               Lower Extremity Assessment: RLE deficits/detail;LLE deficits/detail RLE Deficits / Details: Pt would not assist with any motion of Rt or Lt LE PROM only        Communication   Communication:  (Pt is non verbal unsure what she understands)  Cognition Arousal/Alertness: Lethargic Behavior During Therapy: Flat affect Overall Cognitive Status: History of cognitive impairments - at baseline                         Exercises  PROM      Assessment/Plan    PT Assessment Patent does not need any further PT services  PT Diagnosis     PT Problem List    PT Treatment Interventions     PT Goals (Current goals can be found in the Care Plan section)                 End of Session   Activity Tolerance: Treatment limited secondary to medical complications (Comment) Patient left: with call bell/phone within reach;with family/visitor present           Time: 5643-3295 PT Time Calculation (min) (ACUTE ONLY): 30 min   Charges:   PT Evaluation $Initial PT Evaluation Tier I: 1 Procedure     PT G CodesVirgina Organ, PT CLT [phone] 09/06/2015, 8:45 AM

## 2015-09-07 DIAGNOSIS — E860 Dehydration: Secondary | ICD-10-CM

## 2015-09-07 LAB — COMPREHENSIVE METABOLIC PANEL
ALBUMIN: 2.1 g/dL — AB (ref 3.5–5.0)
ALT: 147 U/L — ABNORMAL HIGH (ref 14–54)
ANION GAP: 4 — AB (ref 5–15)
AST: 66 U/L — ABNORMAL HIGH (ref 15–41)
Alkaline Phosphatase: 71 U/L (ref 38–126)
BUN: 17 mg/dL (ref 6–20)
CHLORIDE: 102 mmol/L (ref 101–111)
CO2: 29 mmol/L (ref 22–32)
Calcium: 8.5 mg/dL — ABNORMAL LOW (ref 8.9–10.3)
Creatinine, Ser: 0.67 mg/dL (ref 0.44–1.00)
GFR calc Af Amer: >60 mL/min (ref >60)
GFR calc non Af Amer: >60 mL/min (ref >60)
GLUCOSE: 97 mg/dL (ref 65–99)
POTASSIUM: 3.6 mmol/L (ref 3.5–5.1)
SODIUM: 135 mmol/L (ref 135–145)
TOTAL PROTEIN: 6.8 g/dL (ref 6.5–8.1)
Total Bilirubin: 0.5 mg/dL (ref 0.3–1.2)

## 2015-09-07 MED ORDER — PHENYTOIN 125 MG/5ML PO SUSP
100.0000 mg | Freq: Three times a day (TID) | ORAL | Status: DC
  Administered 2015-09-07 – 2015-09-09 (×6): 100 mg
  Filled 2015-09-07 (×13): qty 4

## 2015-09-07 MED ORDER — BUDESONIDE-FORMOTEROL FUMARATE 160-4.5 MCG/ACT IN AERO
2.0000 | INHALATION_SPRAY | Freq: Every day | RESPIRATORY_TRACT | Status: DC | PRN
  Filled 2015-09-07: qty 6

## 2015-09-07 NOTE — Progress Notes (Signed)
PROGRESS NOTE  Melissa [name] NWG:[id number] DOB: October 05, 1946 DOA: 09/04/2015 PCP: [name], MD  Summary: 38 yof with a hx of prior stroke who is nonverbal, PEG tube dependent, and has right sided hemiplegia presented with aspiration PNA confirmed from CXR. Pt was admitted for further management.  Assessment/Plan: 1. Aspiration pneumonia, afebrile 24 hours. Clinically improving. Off oxygen.  CT chest revealed new possible minimal dependent aspirate material over the right lower lobe bronchus. Blood cultures no growth today. 2. Dehydration, secondary to FTT in the setting of PNA. Resolved 3. Hyponatremia, secondary to dehydration. Resolved. 4. Cerebrovascular disease, s/p stroke with residual right hemiparesis, PEG tube dependent, functional quadraplegia. 5. Seizure disorder. Will continue Keppra and Vimpat. Neurology recommends weaning off Dilantin gradually. 6. Elevated LFTs. Trending downward, possibly related to Dilantin. Abdominal u/s was unremarkable. 7. Chronic systolic dysfunction. Compensated. 8. Vomiting. Resolved with Reglan.               Improving, now afebrile.  Plan to continue abx, tube feeds, and seizure medications.  Check LFTs in the morning, potential discharge 1-2 days.   Code Status: DNR DVT prophylaxis: Lovenox  Family Communication: No family bedside.  Disposition Plan: Anticipate discharge in 1-2 days  Brendia Sacks, MD  [facility]  Pager [phone] If 7PM-7AM, please contact night-coverage at [email or web address], password [facility] 09/07/2015, 6:40 AM  LOS: 3 days   Consultants:  PT  Neurology-Kofi [place]on[place]ah, MD  Procedures:    Antibiotics:  Vancomycin 9/13 >>  Zosyn 9/12 >>  HPI/Subjective: Pt is nonverbal. No family in the room. History not obtainable.  Objective: Filed Vitals:   09/06/15 1120 09/06/15 1400 09/06/15 2024 09/06/15 2103  BP:  148/61  172/69  Pulse:  80  85  Temp: 98.2 F (36.8 C) 98.1 F (36.7 C)  100 F  (37.8 C)  TempSrc: Axillary   Axillary  Resp:  16  20  Height:      Weight:      SpO2:  95% 100% 97%    Intake/Output Summary (Last 24 hours) at 09/07/15 0640 Last data filed at 09/06/15 1244  Gross per 24 hour  Intake      0 ml  Output      0 ml  Net      0 ml     Filed Weights   09/04/15 1202  Weight: 85.73 kg (189 lb)    Exam: Afebrile, VSS General:  Appears calm and comfortable. Awake but none verbal.  Eyes: PERRL, normal lids, irises & conjunctiva Cardiovascular: RRR, no m/r/g. No LE edema. Respiratory: CTA bilaterally, no w/r/r. Normal respiratory effort. Abdomen: soft, ntnd, positive bowel sounds.Feeding tube in place.  Skin: no rash or induration noted.  Musculoskeletal: Does not follow commands.  Psychiatric: Can not assess Neurologic:Can not assess  New data reviewed:  BMP unremarkable   AST/ALT trending downward  BC no growth to date  Pertinent data since admission:  CT Chest Impresson -Hypoinflation with stable pulmonary fibrosis. No acute airspace process. Possible minimal dependent aspirate material over the right lower lobe bronchus. -Stable cardiomegaly. -Atherosclerotic coronary artery disease. -Percutaneous gastrostomy tube in adequate position. -Absent right kidney.  Chest XR Impression 1. Airspace opacity in the left lower lobe persists. This could represent pneumonia. If the patient has an explanation for chronic core recurring pneumonia such as aspiration then this may not necessarily require further investigation, but otherwise, chest CT should be considered to exclude an underlying mass or other underlying pathologic process as a cause for chronic/recurring  pneumonia. 2. Mild to moderate cardiomegaly. 3. There is potentially some retro diaphragmatic airspace opacity on the right, making this potentially multilobar pneumonia.  Pending data:    Scheduled Meds: . amLODipine  10 mg Per Tube Daily  . budesonide-formoterol  2  puff Inhalation QHS  . carvedilol  6.25 mg Per Tube BID WC  . [START ON 09/08/2015] cloNIDine  0.3 mg Transdermal Weekly  . clopidogrel  75 mg Per Tube Daily  . enoxaparin (LOVENOX) injection  40 mg Subcutaneous Q24H  . feeding supplement (OSMOLITE 1.5 CAL)  237 mL Per Tube 5 X Daily  . feeding supplement (PRO-STAT SUGAR FREE 64)  30 mL Per Tube Q24H  . free water  100 mL Per Tube 6 times per day  . labetalol  200 mg Per Tube BID  . lacosamide  150 mg Per Tube BID  . levETIRAcetam  1,000 mg Per Tube BID  . levothyroxine  75 mcg Per Tube QAC breakfast  . phenytoin  100 mg Per Tube TID  . piperacillin-tazobactam (ZOSYN)  IV  3.375 g Intravenous Q8H  . pravastatin  40 mg Per Tube q1800  . vancomycin  1,000 mg Intravenous Q12H   Continuous Infusions:    Principal Problem:   Aspiration pneumonia Active Problems:   CVA (cerebral infarction)   Elevated LFTs   HTN (hypertension)   Seizure disorder   PEG (percutaneous endoscopic gastrostomy) status   Dehydration   Time spent 20 minutes   By signing my name below, I, Zadie Cleverly attest that this documentation has been prepared under the direction and in the presence of Brendia Sacks, MD Electronically signed: Zadie Cleverly 09/07/2015   I have reviewed the above documentation for accuracy and completeness, and I agree with the above. Brendia Sacks, MD

## 2015-09-08 LAB — HEPATIC FUNCTION PANEL
ALK PHOS: 78 U/L (ref 38–126)
ALT: 124 U/L — AB (ref 14–54)
AST: 55 U/L — ABNORMAL HIGH (ref 15–41)
Albumin: 2.1 g/dL — ABNORMAL LOW (ref 3.5–5.0)
BILIRUBIN DIRECT: 0.1 mg/dL (ref 0.1–0.5)
BILIRUBIN INDIRECT: 0.3 mg/dL (ref 0.3–0.9)
BILIRUBIN TOTAL: 0.4 mg/dL (ref 0.3–1.2)
Total Protein: 7 g/dL (ref 6.5–8.1)

## 2015-09-08 LAB — BASIC METABOLIC PANEL
Anion gap: 7 (ref 5–15)
BUN: 16 mg/dL (ref 6–20)
CALCIUM: 8.8 mg/dL — AB (ref 8.9–10.3)
CHLORIDE: 102 mmol/L (ref 101–111)
CO2: 28 mmol/L (ref 22–32)
CREATININE: 0.63 mg/dL (ref 0.44–1.00)
GFR calc non Af Amer: >60 mL/min (ref >60)
GLUCOSE: 95 mg/dL (ref 65–99)
Potassium: 3.7 mmol/L (ref 3.5–5.1)
Sodium: 137 mmol/L (ref 135–145)

## 2015-09-08 LAB — VANCOMYCIN, TROUGH: Vancomycin Tr: 12 ug/mL (ref 10.0–20.0)

## 2015-09-08 MED ORDER — LEVETIRACETAM 100 MG/ML PO SOLN
1000.0000 mg | Freq: Two times a day (BID) | ORAL | Status: DC
  Administered 2015-09-08 – 2015-09-09 (×3): 1000 mg
  Filled 2015-09-08 (×7): qty 10

## 2015-09-08 NOTE — Care Management Note (Signed)
Case Management Note  Patient Details  Name: Melissa [name] MRN: [id number] Date of Birth: [birth year]  Subjective/Objective:                    Action/Plan:   Expected Discharge Date:  09/06/15               Expected Discharge Plan:  [place]  In-House Referral:  NA  Discharge planning Services  CM Consult  Post Acute Care Choice:  Resumption of Svcs/PTA Provider Choice offered to:  [facility] POA / Guardian, Adult Children  DME Arranged:    DME Agency:     HH Arranged:  RN, PT, OT, Speech Therapy HH Agency:  [facility]  Status of Service:  Completed, signed off  Medicare Important Message Given:  Yes-second notification given Date Medicare IM Given:    Medicare IM give by:    Date Additional Medicare IM Given:    Additional Medicare Important Message give by:     If discussed at [place] of Stay Meetings, dates discussed:    Additional Comments: Anticipate discharge home on 09/09/15 with resumption of [facility] RN, PT, OT, and ST (per family choice). Romualdo Bolk of [facility] is aware and will collect the pts information from the chart. [place] services to start within 48 hours of discharge. Per pts daughter, pt has all PEG feeding supplies at home, hospital bed, hoyer lift. Pt and pts nurse aware of discharge arrangements. Christinia Gully Tumacacori-Carmen, RN 09/08/2015, 1:03 PM

## 2015-09-08 NOTE — Progress Notes (Signed)
ANTIBIOTIC CONSULT NOTE   Pharmacy Consult for zosyn Indication: pneumonia  Allergies  Allergen Reactions  . Morphine Nausea And Vomiting    Patient Measurements: Height: 5\' 6"  (167.6 cm) Weight: 189 lb (85.73 kg) IBW/kg (Calculated) : 59.3   Vital Signs: Temp: 98.8 F (37.1 C) (09/16 0642) Temp Source: Axillary (09/16 0642) BP: 161/69 mmHg (09/16 0642) Pulse Rate: 83 (09/16 0642) Intake/Output from previous day: 09/15 0701 - 09/16 0700 In: 1944 [NG/GT:1794; IV Piggyback:150] Out: -  Intake/Output from this shift:    Labs:  Recent Labs  09/06/15 0549 09/07/15 0605 09/08/15 0607  WBC 8.8  --   --   HGB 10.7*  --   --   PLT 204  --   --   CREATININE 0.70 0.67 0.63   Estimated Creatinine Clearance: 73.2 mL/min (by C-G formula based on Cr of 0.63).  Recent Labs  09/08/15 0607  [facility] 12     Microbiology: Recent Results (from the past 720 hour(s))  Urine culture     Status: None   Collection Time: 09/04/15 12:20 PM  Result Value Ref Range Status   Specimen Description URINE, CATHETERIZED  Final   Special Requests NONE  Final   Culture   Final    NO GROWTH 2 DAYS Performed at [facility]    Report Status 09/06/2015 FINAL  Final  Blood culture (routine x 2)     Status: None (Preliminary result)   Collection Time: 09/04/15 12:50 PM  Result Value Ref Range Status   Specimen Description BLOOD LEFT HAND  Final   Special Requests BOTTLES DRAWN AEROBIC AND ANAEROBIC 4CC  Final   Culture NO GROWTH 4 DAYS  Final   Report Status PENDING  Incomplete  Blood culture (routine x 2)     Status: None (Preliminary result)   Collection Time: 09/04/15 12:56 PM  Result Value Ref Range Status   Specimen Description BLOOD LEFT HAND  Final   Special Requests BOTTLES DRAWN AEROBIC AND ANAEROBIC 3CC EACH  Final   Culture NO GROWTH 4 DAYS  Final   Report Status PENDING  Incomplete    Medical History: Past Medical History  Diagnosis Date  . Vertigo,  intermittent   . Hypothyroidism   . Hyperlipidemia   . Obesity   . Hypertension     severe and resistant to treatment; 2008-negative left renal angiogram  . Vitiligo   . Degenerative disc disease, lumbar   . Degenerative disc disease, cervical   . Chest pain 2008    2008-normal coronary angiography  . 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 Renal cell carcinoma     Right nephrectomy  . Multiple myeloma 08/26/2014    Medications:  See medication history Assessment: 69 yo lady on day 4 zosyn for PNA.  She is clinically improving.  Goal of Therapy:  Eradication of infection  Plan:  Continue zosyn 3.375 gm IV q8 hours F/u renal function, cultures and clinical course  Thanks for allowing pharmacy to be a part of this patient's care.  Excell Seltzer, PharmD Clinical Pharmacist 09/08/2015,8:03 AM

## 2015-09-08 NOTE — Care Management Important Message (Signed)
Important Message  Patient Details  Name: Tangela Dolliver Kosiba MRN: [id number] Date of Birth: February 24, 1946   Medicare Important Message Given:  Yes-second notification given    Joylene Draft, RN 09/08/2015, 12:58 PM

## 2015-09-08 NOTE — Progress Notes (Signed)
PROGRESS NOTE  Melissa [name] XBJ:[id number] DOB: 1946-04-08 DOA: 09/04/2015 PCP: [name], MD  Summary: 28 yof with a hx of prior stroke who is nonverbal, PEG tube dependent, and has right sided hemiplegia presented with aspiration PNA confirmed on CXR. Pt was admitted for further management.  Assessment/Plan: 1. Aspiration pneumonia, afebrile nearly 72 hours. Clinically improving. No hypoxia.  CT chest on admission with new possible minimal dependent aspirate material over the right lower lobe bronchus. Blood cultures reveal no growth in 4 days. 2. Dehydration, secondary to FTT in the setting of PNA. Resolved 3. Hyponatremia, secondary to dehydration. Resolved. 4. Cerebrovascular disease, s/p stroke with residual right hemiparesis, expressive aphasia, PEG tube dependent, functional quadraplegia. 5. Seizure disorder. Will continue Keppra, Vimpat, Dilantin. Neurology considering weaning off of Dilantin as outpatient.  6. Elevated LFTs. Continue to trend downward, etiology unclear, possibly related to Dilantin. Abdominal u/s was unremarkable. 7. Chronic systolic dysfunction. Remains compensated. 8. Vomiting. Resolved with Reglan.               Continues to improve. Afebrile, no hypoxia, more alret.  Will change to abx via feeding tube tomorrow, anticipate discharge tomorrow.   Code Status: DNR DVT prophylaxis: Lovenox  Family Communication: Daughter at bedside. Discussed care plan with her and she expressed understanding.  Disposition Plan: Anticipate discharge within 1-2 days.   Brendia Sacks, MD  [facility]  Pager [phone] If 7PM-7AM, please contact night-coverage at [email or web address], password [facility] 09/08/2015, 6:43 AM  LOS: 4 days   Consultants:  PT  Glenda Chroman, MD  Procedures:    Antibiotics:  Zosyn 9/12 >>  HPI/Subjective: Pt is nonverbal. Per daughter at bedside, she appears much more awake and alert today.   Objective: Filed  Vitals:   09/07/15 1550 09/07/15 1855 09/07/15 2038 09/08/15 0642  BP:   135/51 161/69  Pulse:   66 83  Temp: 100.2 F (37.9 C) 99 F (37.2 C) 98.4 F (36.9 C) 98.8 F (37.1 C)  TempSrc:   Axillary Axillary  Resp:   20 17  Height:      Weight:      SpO2:   100% 96%    Intake/Output Summary (Last 24 hours) at 09/08/15 0643 Last data filed at 09/07/15 1712  Gross per 24 hour  Intake   1070 ml  Output      0 ml  Net   1070 ml     Filed Weights   09/04/15 1202  Weight: 85.73 kg (189 lb)    Exam: Afebrile, VSS  General:Appears calm and comfortable. Appears much more alert today  Cardiovascular: RRR, no m/r/g. No LE edema.  Respiratory: CTA bilaterally, no w/r/r. Normal respiratory effort.  Abdomen: soft, ntnd, positive bowel sounds.   Skin: no rash or induration noted.   Musculoskeletal: No change  Psychiatric: Cannot assess  Neurologic: Cannot assess  New data reviewed:  BMP unremarkable   AST and ALT continue to trend downward.  Pertinent data since admission:  CT Chest Impresson -Hypoinflation with stable pulmonary fibrosis. No acute airspace process. Possible minimal dependent aspirate material over the right lower lobe bronchus. -Stable cardiomegaly. -Atherosclerotic coronary artery disease. -Percutaneous gastrostomy tube in adequate position. -Absent right kidney.  Chest XR Impression 1. Airspace opacity in the left lower lobe persists. This could represent pneumonia. If the patient has an explanation for chronic core recurring pneumonia such as aspiration then this may not necessarily require further investigation, but otherwise, chest CT should be considered to exclude an underlying  mass or other underlying pathologic process as a cause for chronic/recurring pneumonia. 2. Mild to moderate cardiomegaly. 3. There is potentially some retro diaphragmatic airspace opacity on the right, making this potentially multilobar  pneumonia.  Pending data:    Scheduled Meds: . amLODipine  10 mg Per Tube Daily  . carvedilol  6.25 mg Per Tube BID WC  . cloNIDine  0.3 mg Transdermal Weekly  . clopidogrel  75 mg Per Tube Daily  . enoxaparin (LOVENOX) injection  40 mg Subcutaneous Q24H  . feeding supplement (OSMOLITE 1.5 CAL)  237 mL Per Tube 5 X Daily  . feeding supplement (PRO-STAT SUGAR FREE 64)  30 mL Per Tube Q24H  . free water  100 mL Per Tube 6 times per day  . labetalol  200 mg Per Tube BID  . lacosamide  150 mg Per Tube BID  . levETIRAcetam  1,000 mg Per Tube BID  . levothyroxine  75 mcg Per Tube QAC breakfast  . phenytoin  100 mg Per Tube TID  . piperacillin-tazobactam (ZOSYN)  IV  3.375 g Intravenous Q8H  . pravastatin  40 mg Per Tube q1800   Continuous Infusions:    Principal Problem:   Aspiration pneumonia Active Problems:   CVA (cerebral infarction)   Elevated LFTs   HTN (hypertension)   Seizure disorder   PEG (percutaneous endoscopic gastrostomy) status   Dehydration   Time spent 25 minutes  By signing my name below, I, Burnett Harry attest that this documentation has been prepared under the direction and in the presence of Brendia Sacks, MD Electronically signed: Burnett Harry. 09/08/2015  I have reviewed the above documentation for accuracy and completeness, and I agree with the above. Brendia Sacks, MD

## 2015-09-09 DIAGNOSIS — R532 Functional quadriplegia: Secondary | ICD-10-CM

## 2015-09-09 DIAGNOSIS — R4701 Aphasia: Secondary | ICD-10-CM

## 2015-09-09 LAB — CULTURE, BLOOD (ROUTINE X 2)
Culture: NO GROWTH
Culture: NO GROWTH

## 2015-09-09 MED ORDER — AMLODIPINE BESYLATE 10 MG PO TABS: 10.0000 mg | ORAL_TABLET | Freq: Every day | ORAL | Status: AC

## 2015-09-09 MED ORDER — AMOXICILLIN-POT CLAVULANATE 200-28.5 MG/5ML PO SUSR
875.0000 mg | Freq: Two times a day (BID) | ORAL | Status: DC
  Filled 2015-09-09 (×5): qty 50

## 2015-09-09 MED ORDER — AMOXICILLIN-POT CLAVULANATE 400-57 MG/5ML PO SUSR
800.0000 mg | Freq: Two times a day (BID) | ORAL | Status: DC
  Filled 2015-09-09 (×4): qty 10

## 2015-09-09 MED ORDER — AMOXICILLIN-POT CLAVULANATE 400-57 MG/5ML PO SUSR: 800.0000 mg | Freq: Two times a day (BID) | ORAL | Status: DC

## 2015-09-09 MED ORDER — LEVETIRACETAM 100 MG/ML PO SOLN: 1000.0000 mg | Freq: Two times a day (BID) | ORAL | Status: AC

## 2015-09-09 MED ORDER — HYDROCODONE-ACETAMINOPHEN 5-325 MG PO TABS: 1.0000 | ORAL_TABLET | Freq: Two times a day (BID) | ORAL | Status: DC | PRN

## 2015-09-09 MED ORDER — LACOSAMIDE 150 MG PO TABS: 1.0000 | ORAL_TABLET | Freq: Two times a day (BID) | ORAL | Status: DC

## 2015-09-09 MED ORDER — CLOPIDOGREL BISULFATE 75 MG PO TABS: 75.0000 mg | ORAL_TABLET | Freq: Every day | ORAL | Status: DC

## 2015-09-09 MED ORDER — VITAMIN D3 1.25 MG (50000 UT) PO TABS: 1.0000 | ORAL_TABLET | ORAL | Status: DC

## 2015-09-09 MED ORDER — PRO-STAT SUGAR FREE PO LIQD: 30.0000 mL | ORAL | Status: DC

## 2015-09-09 MED ORDER — PHENYTOIN 125 MG/5ML PO SUSP: 100.0000 mg | Freq: Three times a day (TID) | ORAL | Status: DC

## 2015-09-09 NOTE — Progress Notes (Signed)
Patient with orders to be discharge home with home health. Orders faxed to [facility]. Discharge instruction given, family verbalized understanding. Patient stable. Patient transported via EMS.

## 2015-09-09 NOTE — Discharge Summary (Signed)
.  Physician Discharge Summary  Melissa [name] ZOX:[id number] DOB: [birth year] DOA: 09/04/2015  PCP: [name], MD  Admit date: 09/04/2015 Discharge date: 09/09/2015  Recommendations for Outpatient Follow-up:   Follow up resolution of aspiration pneumonia with PCP within 1-2 weeks.  Follow-up elevated LFTs, see below  Follow up with Dr. Gerilyn Pilgrim as outpatient, consider weaning off phenytoin    Follow-up Information    Follow up with [facility].   Contact information:   [address] [place] [address] [phone]       Follow up with [name], MD. Schedule an appointment as soon as possible for a visit in 1 week.   Specialty:  Family Medicine   Contact information:   [address], [address] [place] [address] [phone]       Follow up with [facility], KOFI, MD In 1 month.   Specialty:  Neurology   Contact information:   [address] [place] [address] [phone]      Discharge Diagnoses:  1. Aspiration pneumonia.  2. Vomiting. 3. Dehydration, secondary to FTT in the setting of PNA.  4. Hyponatremia, secondary to dehydration. 5. Cerebrovascular disease.  6. PMH stroke with residual right hemiparesis, expressive aphasia 7. Functional quadraplegia 8. Seizure disorder 9. Elevated LFTs  Discharge Condition: Improved Disposition: Discharge home  Diet recommendation: Regular  Filed Weights   09/04/15 1202  Weight: 85.73 kg (189 lb)    History of present illness:  48 yof with a hx of prior stroke who is nonverbal, PEG tube dependent, and has right sided hemiplegia presented with aspiration PNA.  Hospital Course:  Aspiration pneumonia improved with broad spectrum abx, with resolution of fever. No hypoxia.  Dehydration and hyponatremia, both secondary to FTT in the setting of pneumonia were resolved with IVF. PICC was  placed due to lack of venous access. Elevated LFTs were noted and trended down  spontaneously. Neurology evaluated and recommended considering gradually weaning Dilantin, since this may be the source of elevated liver enzymes. Hospitalization was uncomplicated.  Individual issues as below:  1. Aspiration pneumonia, afebrile greater than 72 hours. Appears resolved. No hypoxia. CT chest on admission with new possible minimal dependent aspirate material over the right lower lobe bronchus. Blood cultures reveal no growth, final.  2. Dehydration, secondary to FTT in the setting of PNA. Resolved 3. Hyponatremia, secondary to dehydration. Resolved. 4. Cerebrovascular disease, s/p stroke with residual right hemiparesis, expressive aphasia, PEG tube dependent, functional quadraplegia. 5. Seizure disorder. Stable. Will continue Keppra, Vimpat, Dilantin. No seizures as an inpatient. Neurology considering weaning off of Dilantin as outpatient.  6. Elevated LFTs. Trending downward. etiology unclear, possibly related to Dilantin or statin. Abdominal u/s was unremarkable. Follow up as an outpatient  7. Chronic systolic dysfunction. Remains compensated. 8. Vomiting. Resolved with Reglan.  Consultants:  PT  Glenda Chroman, MD  Procedures:  None   Antibiotics:  Zosyn 9/12 >> 9/17  Augmentin 9/17 >> 9/21   Discharge Instructions Discharge Instructions    Bed rest    Complete by:  As directed      Discharge instructions    Complete by:  As directed   Call your physician or seek immediate medical attention for fever, vomiting, shortness of breathing, decreased alertness or worsening of condition.            Discharge Medication List as of 09/09/2015 11:37 AM    START taking these medications   Details  amoxicillin-clavulanate (AUGMENTIN)  400-57 MG/5ML suspension Place 10 mLs (800 mg total) into feeding tube every 12 (twelve) hours., Starting 09/09/2015, Until Discontinued, Normal      CONTINUE these medications which have CHANGED   Details  Amino  Acids-Protein Hydrolys (FEEDING SUPPLEMENT, PRO-STAT SUGAR FREE 64,) LIQD Place 30 mLs into feeding tube daily., Starting 09/09/2015, Until Discontinued, No Print    amLODipine (NORVASC) 10 MG tablet Place 1 tablet (10 mg total) into feeding tube daily., Starting 09/09/2015, Until Discontinued, No Print    Cholecalciferol (VITAMIN D3) 50000 UNITS TABS Place 1 tablet into feeding tube once a week. By feeding tube, Starting 09/09/2015, Until Discontinued, No Print    clopidogrel (PLAVIX) 75 MG tablet Place 1 tablet (75 mg total) into feeding tube daily., Starting 09/09/2015, Until Discontinued, No Print    HYDROcodone-acetaminophen (NORCO/VICODIN) 5-325 MG per tablet Place 1 tablet into feeding tube 2 (two) times daily as needed for moderate pain., Starting 09/09/2015, Until Discontinued, No Print    Lacosamide 150 MG TABS Place 1 tablet (150 mg total) into feeding tube 2 (two) times daily., Starting 09/09/2015, Until Discontinued, No Print    levETIRAcetam (KEPPRA) 100 MG/ML solution Place 10 mLs (1,000 mg total) into feeding tube 2 (two) times daily., Starting 09/09/2015, Until Discontinued, No Print    phenytoin (DILANTIN) 125 MG/5ML suspension Place 4 mLs (100 mg total) into feeding tube 3 (three) times daily., Starting 09/09/2015, Until Discontinued, No Print      CONTINUE these medications which have NOT CHANGED   Details  acetaminophen (TYLENOL) 650 MG CR tablet 650 mg by Feeding Tube route every 8 (eight) hours as needed for pain. , Until Discontinued, Historical Med    albuterol (PROVENTIL) (2.5 MG/3ML) 0.083% nebulizer solution Take 2.5 mg by nebulization every 6 (six) hours as needed for wheezing or shortness of breath., Until Discontinued, Historical Med    carvedilol (COREG) 6.25 MG tablet TAKE ONE TABLET INTO FEEDING TUBE TWICE DAILY WITH MEALS, Normal    clonazePAM (KLONOPIN) 0.5 MG tablet Place 1 tablet (0.5 mg total) into feeding tube 3 (three) times daily as needed., Starting  08/03/2015, Until Discontinued, Print    cloNIDine (CATAPRES - DOSED IN MG/24 HR) 0.3 mg/24hr patch APPLY 1 PATCH TOPICALLY ONCE A WEEK, Normal    DULoxetine (CYMBALTA) 30 MG capsule 30 mg by Feeding Tube route 2 (two) times daily. , Starting 05/23/2015, Until Discontinued, Historical Med    levothyroxine (SYNTHROID, LEVOTHROID) 75 MCG tablet One tablet mon-fri and one half tab on sat and sun, Normal    lovastatin (MEVACOR) 40 MG tablet Place 1 tablet (40 mg total) into feeding tube at bedtime., Starting 10/01/2014, Until Discontinued, No Print    Nutritional Supplements (FEEDING SUPPLEMENT, OSMOLITE 1.5 CAL,) LIQD Place 237 mLs into feeding tube 5 (five) times daily., Starting 08/03/2015, Until Discontinued, No Print    prochlorperazine (COMPAZINE) 10 MG tablet 10 mg by Feeding Tube route every 6 (six) hours as needed for nausea or vomiting. , Until Discontinued, Historical Med    Water For Irrigation, Sterile (FREE WATER) SOLN Place 100 mLs into feeding tube every 4 (four) hours., Starting 10/01/2014, Until Discontinued, No Print      STOP taking these medications     budesonide-formoterol (SYMBICORT) 160-4.5 MCG/ACT inhaler      labetalol (NORMODYNE) 200 MG tablet      vancomycin (VANCOCIN) 50 mg/mL oral solution      Vitamin D, Ergocalciferol, (DRISDOL) 50000 UNITS CAPS capsule        Allergies  Allergen Reactions  . Morphine Nausea And Vomiting    The results of significant diagnostics from this hospitalization (including imaging, microbiology, ancillary and laboratory) are listed below for reference.    Significant Diagnostic Studies: Ct Chest W Contrast  09/04/2015   CLINICAL DATA:  Abnormal chest x-ray with left lower lobe opacity.  EXAM: CT CHEST WITH CONTRAST  TECHNIQUE: Multidetector CT imaging of the chest was performed during intravenous contrast administration.  CONTRAST:  75mL OMNIPAQUE IOHEXOL 300 MG/ML  SOLN  COMPARISON:  CT 09/20/2014, 05/19/2014 and 05/11/2013.   FINDINGS: Lungs are hypoinflated and demonstrate evidence of known bibasilar pulmonary fibrosis without significant change from 05/11/2014. Mild traction bronchiectasis left lower lobe. No focal airspace process. Previously noted 4 mm nodule over the medial left upper lobe not well visualized. Possible minimal dependent aspirate material within the right lower lobe bronchus.  There is mild stable cardiomegaly. There is calcified plaque over the left main and left anterior descending coronary arteries. Mild calcified plaque over the thoracic aorta. No significant mediastinal, hilar or axillary adenopathy. Mild heterogeneity with calcification over the thyroid gland.  Images through the upper abdomen demonstrate a percutaneous gastrostomy tube in adequate position. Absent right kidney. Minimal calcified plaque over the abdominal aorta. There are mild degenerate changes of the spine.  IMPRESSION: Hypoinflation with stable pulmonary fibrosis. No acute airspace process. Possible minimal dependent aspirate material over the right lower lobe bronchus.  Stable cardiomegaly.  Atherosclerotic coronary artery disease.  Percutaneous gastrostomy tube in adequate position.  Absent right kidney.   Electronically Signed   By: Elberta Fortis M.D.   On: 09/04/2015 16:29   Dg Chest Portable 1 View  09/04/2015   CLINICAL DATA:  Fever, vomiting and lethargy x 2 days. Hx of stroke last Sept and was admitted this August for seizures. Hx htn. Pt unable to sit up or hold head up or follow breathing instructions.  EXAM: PORTABLE CHEST - 1 VIEW  COMPARISON:  08/01/2015  FINDINGS: Low lung volumes are present, causing crowding of the pulmonary vasculature. Mild to moderate enlargement of the cardiopericardial silhouette obscuration of the left hemidiaphragm compatible with left lower lobe airspace opacity. Retro diaphragmatic density in the posterior basal segment right lower lobe.  IMPRESSION: 1. Airspace opacity in the left lower lobe  persists. This could represent pneumonia. If the patient has an explanation for chronic core recurring pneumonia such as aspiration then this may not necessarily require further investigation, but otherwise, chest CT should be considered to exclude an underlying mass or other underlying pathologic process as a cause for chronic/recurring pneumonia. 2. Mild to moderate cardiomegaly. 3. There is potentially some retro diaphragmatic airspace opacity on the right, making this potentially multilobar pneumonia.   Electronically Signed   By: Gaylyn Rong M.D.   On: 09/04/2015 12:57   US Abdomen Limited Ruq  09/06/2015   CLINICAL DATA:  Elevated liver enzymes. History of multiple myeloma. Prior right-sided renal cell carcinoma  EXAM: US ABDOMEN LIMITED - RIGHT UPPER QUADRANT  COMPARISON:  CT abdomen and pelvis April 13, 2015  FINDINGS: Gallbladder:  No gallstones or wall thickening visualized. There is no pericholecystic fluid. No sonographic Murphy sign noted.  Common bile duct:  Diameter: 8 mm, mildly prominent.  No mass or calculus appreciable.  Liver:  No focal lesion identified. Within normal limits in parenchymal echogenicity.  Right kidney absent.  IMPRESSION: Mild prominence of the common bile duct without mass or calculus appreciable on this study. No gallbladder pathology demonstrable. No focal  liver lesions appreciable. Right kidney absent.   Electronically Signed   By: Bretta Bang III M.D.   On: 09/06/2015 11:49    Microbiology: Recent Results (from the past 240 hour(s))  Urine culture     Status: None   Collection Time: 09/04/15 12:20 PM  Result Value Ref Range Status   Specimen Description URINE, CATHETERIZED  Final   Special Requests NONE  Final   Culture   Final    NO GROWTH 2 DAYS Performed at [facility]    Report Status 09/06/2015 FINAL  Final  Blood culture (routine x 2)     Status: None   Collection Time: 09/04/15 12:50 PM  Result Value Ref Range Status    Specimen Description BLOOD LEFT HAND  Final   Special Requests BOTTLES DRAWN AEROBIC AND ANAEROBIC 4CC  Final   Culture NO GROWTH 5 DAYS  Final   Report Status 09/09/2015 FINAL  Final  Blood culture (routine x 2)     Status: None   Collection Time: 09/04/15 12:56 PM  Result Value Ref Range Status   Specimen Description BLOOD LEFT HAND  Final   Special Requests BOTTLES DRAWN AEROBIC AND ANAEROBIC 3CC EACH  Final   Culture NO GROWTH 5 DAYS  Final   Report Status 09/09/2015 FINAL  Final     Labs: Basic Metabolic Panel:  Recent Labs Lab 09/04/15 1250 09/06/15 0549 09/07/15 0605 09/08/15 0607  NA 130* 135 135 137  K 4.0 3.6 3.6 3.7  CL 96* 102 102 102  CO2 29 27 29 28   GLUCOSE 124* 114* 97 95  BUN 30* 20 17 16   CREATININE 0.80 0.70 0.67 0.63  CALCIUM 8.6* 8.3* 8.5* 8.8*   Liver Function Tests:  Recent Labs Lab 09/04/15 1250 09/07/15 0605 09/08/15 0607  AST 142* 66* 55*  ALT 201* 147* 124*  ALKPHOS 69 71 78  BILITOT 0.7 0.5 0.4  PROT 7.9 6.8 7.0  ALBUMIN 2.7* 2.1* 2.1*    Recent Labs Lab 09/04/15 1250  LIPASE 12*   CBC:  Recent Labs Lab 09/04/15 1250 09/06/15 0549  WBC 13.3* 8.8  NEUTROABS 9.7*  --   HGB 12.8 10.7*  HCT 37.4 30.2*  MCV 91.2 89.9  PLT 210 204      CBG:  Principal Problem:   Aspiration pneumonia Active Problems:   CVA (cerebral infarction)   Elevated LFTs   HTN (hypertension)   Seizure disorder   PEG (percutaneous endoscopic gastrostomy) status   Dehydration   Time coordinating discharge: 40 minutes   By signing my name below, I, Arielle Khosrowpour, attest that this documentation has been prepared under the direction and in the presence of Daniel P. Irene Limbo, MD. Electronically signed: Dawayne Cirri  09/09/2015   I personally performed the services described in this documentation. All medical record entries made by the scribe were at my direction and in my presence. I have reviewed the chart and discharge  instructions (if applicable) and agree that the record reflects my personal performance and is accurate and complete. Brendia Sacks, MD   Signed:  Brendia Sacks, MD [facility] 09/09/2015, 9:45 AM

## 2015-09-09 NOTE — Progress Notes (Signed)
PROGRESS NOTE  Melissa [name] [id number]:[id number] DOB: 1946/11/18 DOA: 09/04/2015 PCP: [name], MD  Summary: 69 yof with a hx of prior stroke who is nonverbal, PEG tube dependent, and has right sided hemiplegia presented with aspiration PNA confirmed on CXR. Pt was admitted for further management.  Assessment/Plan: 1. Aspiration pneumonia, afebrile greater than 72 hours. Appears resolved. No hypoxia.  CT chest on admission with new possible minimal dependent aspirate material over the right lower lobe bronchus. Blood cultures reveal no growth, final.  2. Dehydration, secondary to FTT in the setting of PNA. Resolved 3. Hyponatremia, secondary to dehydration. Resolved. 4. Cerebrovascular disease, s/p stroke with residual right hemiparesis, expressive aphasia, PEG tube dependent, functional quadraplegia. 5. Seizure disorder. Stable. Will continue Keppra, Vimpat, Dilantin. No seizures as an inpatient.  Neurology considering weaning off of Dilantin as outpatient.  6. Elevated LFTs. Trending downward. etiology unclear, possibly related to Dilantin or statin. Abdominal u/s was unremarkable. Follow up as an outpatient  7. Chronic systolic dysfunction. Remains compensated. 8. Vomiting. Resolved with Reglan.               Home today antibiotic per tube  Continue seizure medicine, follow up with Dr. Gerilyn Pilgrim as outpatient, consider weaning Dilantin.  Follow up LFTs  Code Status: DNR DVT prophylaxis: Lovenox  Family Communication: Melissa [name] at bedside. No questions at this time  Disposition Plan: Discharge today   Brendia Sacks, MD  [facility]  Pager [phone] If 7PM-7AM, please contact night-coverage at [email or web address], password [facility] 09/09/2015, 6:46 AM  LOS: 5 days   Consultants:  PT  Glenda Chroman, MD  Procedures:    Antibiotics:  Zosyn 9/12 >> 9/17  Augmentin 9/17 >> 9/21  HPI/Subjective: Non verbal at baseline. Melissa [name] says she is doing better. No  new issues.  Objective: Filed Vitals:   09/08/15 0642 09/08/15 1534 09/08/15 2052 09/09/15 0555  BP: 161/69 155/103 170/69 158/106  Pulse: 83 83 83 81  Temp: 98.8 F (37.1 C) 99.5 F (37.5 C) 99 F (37.2 C) 97.9 F (36.6 C)  TempSrc: Axillary Axillary Axillary Axillary  Resp: 17 17 16 16   Height:      Weight:      SpO2: 96% 100% 100% 99%    Intake/Output Summary (Last 24 hours) at 09/09/15 0646 Last data filed at 09/08/15 6440  Gross per 24 hour  Intake    874 ml  Output      0 ml  Net    874 ml     Filed Weights   09/04/15 1202  Weight: 85.73 kg (189 lb)    Exam: Afebrile, VSS General:  Appears calm and comfortable, very alert Cardiovascular: RRR, no m/r/g. No LE edema. Respiratory: CTA bilaterally, no w/r/r. Normal respiratory effort. Abdomen: soft, ntnd, PEG tube in place  Skin: no rash or induration noted Psychiatric: non verbal, no change, can not assess   New data reviewed:  No new data   Pertinent data since admission:  CT Chest Impresson -Hypoinflation with stable pulmonary fibrosis. No acute airspace process. Possible minimal dependent aspirate material over the right lower lobe bronchus. -Stable cardiomegaly. -Atherosclerotic coronary artery disease. -Percutaneous gastrostomy tube in adequate position. -Absent right kidney.  Chest XR Impression 1. Airspace opacity in the left lower lobe persists. This could represent pneumonia. If the patient has an explanation for chronic core recurring pneumonia such as aspiration then this may not necessarily require further investigation, but otherwise, chest CT should be considered to exclude an underlying mass or other  underlying pathologic process as a cause for chronic/recurring pneumonia. 2. Mild to moderate cardiomegaly. 3. There is potentially some retro diaphragmatic airspace opacity on the right, making this potentially multilobar pneumonia.  Pending data:    Scheduled Meds: .  amLODipine  10 mg Per Tube Daily  . cloNIDine  0.3 mg Transdermal Weekly  . clopidogrel  75 mg Per Tube Daily  . enoxaparin (LOVENOX) injection  40 mg Subcutaneous Q24H  . feeding supplement (OSMOLITE 1.5 CAL)  237 mL Per Tube 5 X Daily  . feeding supplement (PRO-STAT SUGAR FREE 64)  30 mL Per Tube Q24H  . free water  100 mL Per Tube 6 times per day  . labetalol  200 mg Per Tube BID  . lacosamide  150 mg Per Tube BID  . levETIRAcetam  1,000 mg Per Tube BID  . levothyroxine  75 mcg Per Tube QAC breakfast  . phenytoin  100 mg Per Tube TID  . piperacillin-tazobactam (ZOSYN)  IV  3.375 g Intravenous Q8H  . pravastatin  40 mg Per Tube q1800   Continuous Infusions:    Principal Problem:   Aspiration pneumonia Active Problems:   CVA (cerebral infarction)   Elevated LFTs   HTN (hypertension)   Seizure disorder   PEG (percutaneous endoscopic gastrostomy) status   Dehydration   By signing my name below, I, Arielle Khosrowpour, attest that this documentation has been prepared under the direction and in the presence of Daniel P. Irene Limbo, MD. Electronically signed: Dawayne Cirri. 09/09/2015  I have reviewed the above documentation for accuracy and completeness, and I agree with the above. Brendia Sacks, MD

## 2015-09-10 DIAGNOSIS — J690 Pneumonitis due to inhalation of food and vomit: Secondary | ICD-10-CM | POA: Diagnosis not present

## 2015-09-10 DIAGNOSIS — Z431 Encounter for attention to gastrostomy: Secondary | ICD-10-CM | POA: Diagnosis not present

## 2015-09-10 DIAGNOSIS — I69351 Hemiplegia and hemiparesis following cerebral infarction affecting right dominant side: Secondary | ICD-10-CM | POA: Diagnosis not present

## 2015-09-10 DIAGNOSIS — I129 Hypertensive chronic kidney disease with stage 1 through stage 4 chronic kidney disease, or unspecified chronic kidney disease: Secondary | ICD-10-CM | POA: Diagnosis not present

## 2015-09-10 DIAGNOSIS — R627 Adult failure to thrive: Secondary | ICD-10-CM | POA: Diagnosis not present

## 2015-09-10 DIAGNOSIS — N183 Chronic kidney disease, stage 3 (moderate): Secondary | ICD-10-CM | POA: Diagnosis not present

## 2015-09-10 DIAGNOSIS — I69320 Aphasia following cerebral infarction: Secondary | ICD-10-CM | POA: Diagnosis not present

## 2015-09-10 DIAGNOSIS — E039 Hypothyroidism, unspecified: Secondary | ICD-10-CM | POA: Diagnosis not present

## 2015-09-10 DIAGNOSIS — G40909 Epilepsy, unspecified, not intractable, without status epilepticus: Secondary | ICD-10-CM | POA: Diagnosis not present

## 2015-09-11 ENCOUNTER — Ambulatory Visit (HOSPITAL_COMMUNITY): Payer: Medicare Other | Admitting: Hematology & Oncology

## 2015-09-11 ENCOUNTER — Other Ambulatory Visit (HOSPITAL_COMMUNITY): Payer: Medicare Other

## 2015-09-11 DIAGNOSIS — N183 Chronic kidney disease, stage 3 (moderate): Secondary | ICD-10-CM | POA: Diagnosis not present

## 2015-09-11 DIAGNOSIS — Z431 Encounter for attention to gastrostomy: Secondary | ICD-10-CM | POA: Diagnosis not present

## 2015-09-11 DIAGNOSIS — I69320 Aphasia following cerebral infarction: Secondary | ICD-10-CM | POA: Diagnosis not present

## 2015-09-11 DIAGNOSIS — I69351 Hemiplegia and hemiparesis following cerebral infarction affecting right dominant side: Secondary | ICD-10-CM | POA: Diagnosis not present

## 2015-09-11 DIAGNOSIS — E039 Hypothyroidism, unspecified: Secondary | ICD-10-CM | POA: Diagnosis not present

## 2015-09-11 DIAGNOSIS — J690 Pneumonitis due to inhalation of food and vomit: Secondary | ICD-10-CM | POA: Diagnosis not present

## 2015-09-11 DIAGNOSIS — R627 Adult failure to thrive: Secondary | ICD-10-CM | POA: Diagnosis not present

## 2015-09-11 DIAGNOSIS — I129 Hypertensive chronic kidney disease with stage 1 through stage 4 chronic kidney disease, or unspecified chronic kidney disease: Secondary | ICD-10-CM | POA: Diagnosis not present

## 2015-09-11 DIAGNOSIS — G40909 Epilepsy, unspecified, not intractable, without status epilepticus: Secondary | ICD-10-CM | POA: Diagnosis not present

## 2015-09-12 ENCOUNTER — Ambulatory Visit: Payer: Self-pay | Admitting: Neurology

## 2015-09-12 DIAGNOSIS — E039 Hypothyroidism, unspecified: Secondary | ICD-10-CM | POA: Diagnosis not present

## 2015-09-12 DIAGNOSIS — N183 Chronic kidney disease, stage 3 (moderate): Secondary | ICD-10-CM | POA: Diagnosis not present

## 2015-09-12 DIAGNOSIS — I69351 Hemiplegia and hemiparesis following cerebral infarction affecting right dominant side: Secondary | ICD-10-CM | POA: Diagnosis not present

## 2015-09-12 DIAGNOSIS — J690 Pneumonitis due to inhalation of food and vomit: Secondary | ICD-10-CM | POA: Diagnosis not present

## 2015-09-12 DIAGNOSIS — I69320 Aphasia following cerebral infarction: Secondary | ICD-10-CM | POA: Diagnosis not present

## 2015-09-12 DIAGNOSIS — Z431 Encounter for attention to gastrostomy: Secondary | ICD-10-CM | POA: Diagnosis not present

## 2015-09-12 DIAGNOSIS — G40909 Epilepsy, unspecified, not intractable, without status epilepticus: Secondary | ICD-10-CM | POA: Diagnosis not present

## 2015-09-12 DIAGNOSIS — I129 Hypertensive chronic kidney disease with stage 1 through stage 4 chronic kidney disease, or unspecified chronic kidney disease: Secondary | ICD-10-CM | POA: Diagnosis not present

## 2015-09-12 DIAGNOSIS — R627 Adult failure to thrive: Secondary | ICD-10-CM | POA: Diagnosis not present

## 2015-09-13 DIAGNOSIS — G40909 Epilepsy, unspecified, not intractable, without status epilepticus: Secondary | ICD-10-CM | POA: Diagnosis not present

## 2015-09-13 DIAGNOSIS — R627 Adult failure to thrive: Secondary | ICD-10-CM | POA: Diagnosis not present

## 2015-09-13 DIAGNOSIS — J690 Pneumonitis due to inhalation of food and vomit: Secondary | ICD-10-CM | POA: Diagnosis not present

## 2015-09-13 DIAGNOSIS — E039 Hypothyroidism, unspecified: Secondary | ICD-10-CM | POA: Diagnosis not present

## 2015-09-13 DIAGNOSIS — Z431 Encounter for attention to gastrostomy: Secondary | ICD-10-CM | POA: Diagnosis not present

## 2015-09-13 DIAGNOSIS — N183 Chronic kidney disease, stage 3 (moderate): Secondary | ICD-10-CM | POA: Diagnosis not present

## 2015-09-13 DIAGNOSIS — I129 Hypertensive chronic kidney disease with stage 1 through stage 4 chronic kidney disease, or unspecified chronic kidney disease: Secondary | ICD-10-CM | POA: Diagnosis not present

## 2015-09-13 DIAGNOSIS — I69320 Aphasia following cerebral infarction: Secondary | ICD-10-CM | POA: Diagnosis not present

## 2015-09-13 DIAGNOSIS — I69351 Hemiplegia and hemiparesis following cerebral infarction affecting right dominant side: Secondary | ICD-10-CM | POA: Diagnosis not present

## 2015-09-14 DIAGNOSIS — J690 Pneumonitis due to inhalation of food and vomit: Secondary | ICD-10-CM | POA: Diagnosis not present

## 2015-09-14 DIAGNOSIS — I69351 Hemiplegia and hemiparesis following cerebral infarction affecting right dominant side: Secondary | ICD-10-CM | POA: Diagnosis not present

## 2015-09-14 DIAGNOSIS — Z431 Encounter for attention to gastrostomy: Secondary | ICD-10-CM | POA: Diagnosis not present

## 2015-09-14 DIAGNOSIS — R627 Adult failure to thrive: Secondary | ICD-10-CM | POA: Diagnosis not present

## 2015-09-14 DIAGNOSIS — I129 Hypertensive chronic kidney disease with stage 1 through stage 4 chronic kidney disease, or unspecified chronic kidney disease: Secondary | ICD-10-CM | POA: Diagnosis not present

## 2015-09-14 DIAGNOSIS — G40909 Epilepsy, unspecified, not intractable, without status epilepticus: Secondary | ICD-10-CM | POA: Diagnosis not present

## 2015-09-14 DIAGNOSIS — E039 Hypothyroidism, unspecified: Secondary | ICD-10-CM | POA: Diagnosis not present

## 2015-09-14 DIAGNOSIS — N183 Chronic kidney disease, stage 3 (moderate): Secondary | ICD-10-CM | POA: Diagnosis not present

## 2015-09-14 DIAGNOSIS — I69320 Aphasia following cerebral infarction: Secondary | ICD-10-CM | POA: Diagnosis not present

## 2015-09-15 DIAGNOSIS — M6281 Muscle weakness (generalized): Secondary | ICD-10-CM | POA: Diagnosis not present

## 2015-09-15 DIAGNOSIS — N183 Chronic kidney disease, stage 3 (moderate): Secondary | ICD-10-CM | POA: Diagnosis not present

## 2015-09-15 DIAGNOSIS — R1311 Dysphagia, oral phase: Secondary | ICD-10-CM | POA: Diagnosis not present

## 2015-09-15 DIAGNOSIS — I63032 Cerebral infarction due to thrombosis of left carotid artery: Secondary | ICD-10-CM | POA: Diagnosis not present

## 2015-09-15 DIAGNOSIS — I6900 Unspecified sequelae of nontraumatic subarachnoid hemorrhage: Secondary | ICD-10-CM | POA: Diagnosis not present

## 2015-09-18 ENCOUNTER — Telehealth: Payer: Self-pay | Admitting: Family Medicine

## 2015-09-18 DIAGNOSIS — I69351 Hemiplegia and hemiparesis following cerebral infarction affecting right dominant side: Secondary | ICD-10-CM | POA: Diagnosis not present

## 2015-09-18 DIAGNOSIS — E039 Hypothyroidism, unspecified: Secondary | ICD-10-CM | POA: Diagnosis not present

## 2015-09-18 DIAGNOSIS — N183 Chronic kidney disease, stage 3 (moderate): Secondary | ICD-10-CM | POA: Diagnosis not present

## 2015-09-18 DIAGNOSIS — I69320 Aphasia following cerebral infarction: Secondary | ICD-10-CM | POA: Diagnosis not present

## 2015-09-18 DIAGNOSIS — Z431 Encounter for attention to gastrostomy: Secondary | ICD-10-CM | POA: Diagnosis not present

## 2015-09-18 DIAGNOSIS — G40909 Epilepsy, unspecified, not intractable, without status epilepticus: Secondary | ICD-10-CM | POA: Diagnosis not present

## 2015-09-18 DIAGNOSIS — R627 Adult failure to thrive: Secondary | ICD-10-CM | POA: Diagnosis not present

## 2015-09-18 DIAGNOSIS — I129 Hypertensive chronic kidney disease with stage 1 through stage 4 chronic kidney disease, or unspecified chronic kidney disease: Secondary | ICD-10-CM | POA: Diagnosis not present

## 2015-09-18 DIAGNOSIS — J690 Pneumonitis due to inhalation of food and vomit: Secondary | ICD-10-CM | POA: Diagnosis not present

## 2015-09-18 NOTE — Telephone Encounter (Signed)
[name] needs to speak to the nurse regarding Francines medication, please advise?

## 2015-09-18 NOTE — Telephone Encounter (Signed)
Addressed concern with daughter and she received refills needed from Dr. Merlene Laughter.

## 2015-09-20 DIAGNOSIS — I129 Hypertensive chronic kidney disease with stage 1 through stage 4 chronic kidney disease, or unspecified chronic kidney disease: Secondary | ICD-10-CM | POA: Diagnosis not present

## 2015-09-20 DIAGNOSIS — N183 Chronic kidney disease, stage 3 (moderate): Secondary | ICD-10-CM | POA: Diagnosis not present

## 2015-09-20 DIAGNOSIS — Z431 Encounter for attention to gastrostomy: Secondary | ICD-10-CM | POA: Diagnosis not present

## 2015-09-20 DIAGNOSIS — I69351 Hemiplegia and hemiparesis following cerebral infarction affecting right dominant side: Secondary | ICD-10-CM | POA: Diagnosis not present

## 2015-09-20 DIAGNOSIS — J690 Pneumonitis due to inhalation of food and vomit: Secondary | ICD-10-CM | POA: Diagnosis not present

## 2015-09-20 DIAGNOSIS — G40909 Epilepsy, unspecified, not intractable, without status epilepticus: Secondary | ICD-10-CM | POA: Diagnosis not present

## 2015-09-20 DIAGNOSIS — I69320 Aphasia following cerebral infarction: Secondary | ICD-10-CM | POA: Diagnosis not present

## 2015-09-20 DIAGNOSIS — R627 Adult failure to thrive: Secondary | ICD-10-CM | POA: Diagnosis not present

## 2015-09-20 DIAGNOSIS — E039 Hypothyroidism, unspecified: Secondary | ICD-10-CM | POA: Diagnosis not present

## 2015-09-21 DIAGNOSIS — F0150 Vascular dementia without behavioral disturbance: Secondary | ICD-10-CM | POA: Diagnosis not present

## 2015-09-21 DIAGNOSIS — G40909 Epilepsy, unspecified, not intractable, without status epilepticus: Secondary | ICD-10-CM | POA: Diagnosis not present

## 2015-09-21 DIAGNOSIS — E039 Hypothyroidism, unspecified: Secondary | ICD-10-CM | POA: Diagnosis not present

## 2015-09-21 DIAGNOSIS — R627 Adult failure to thrive: Secondary | ICD-10-CM | POA: Diagnosis not present

## 2015-09-21 DIAGNOSIS — Z431 Encounter for attention to gastrostomy: Secondary | ICD-10-CM | POA: Diagnosis not present

## 2015-09-21 DIAGNOSIS — N183 Chronic kidney disease, stage 3 (moderate): Secondary | ICD-10-CM | POA: Diagnosis not present

## 2015-09-21 DIAGNOSIS — I69320 Aphasia following cerebral infarction: Secondary | ICD-10-CM | POA: Diagnosis not present

## 2015-09-21 DIAGNOSIS — G40411 Other generalized epilepsy and epileptic syndromes, intractable, with status epilepticus: Secondary | ICD-10-CM | POA: Diagnosis not present

## 2015-09-21 DIAGNOSIS — I69920 Aphasia following unspecified cerebrovascular disease: Secondary | ICD-10-CM | POA: Diagnosis not present

## 2015-09-21 DIAGNOSIS — I69351 Hemiplegia and hemiparesis following cerebral infarction affecting right dominant side: Secondary | ICD-10-CM | POA: Diagnosis not present

## 2015-09-21 DIAGNOSIS — Z79899 Other long term (current) drug therapy: Secondary | ICD-10-CM | POA: Diagnosis not present

## 2015-09-21 DIAGNOSIS — R269 Unspecified abnormalities of gait and mobility: Secondary | ICD-10-CM | POA: Diagnosis not present

## 2015-09-21 DIAGNOSIS — I129 Hypertensive chronic kidney disease with stage 1 through stage 4 chronic kidney disease, or unspecified chronic kidney disease: Secondary | ICD-10-CM | POA: Diagnosis not present

## 2015-09-21 DIAGNOSIS — J690 Pneumonitis due to inhalation of food and vomit: Secondary | ICD-10-CM | POA: Diagnosis not present

## 2015-09-22 DIAGNOSIS — J690 Pneumonitis due to inhalation of food and vomit: Secondary | ICD-10-CM | POA: Diagnosis not present

## 2015-09-22 DIAGNOSIS — E039 Hypothyroidism, unspecified: Secondary | ICD-10-CM | POA: Diagnosis not present

## 2015-09-22 DIAGNOSIS — R627 Adult failure to thrive: Secondary | ICD-10-CM | POA: Diagnosis not present

## 2015-09-22 DIAGNOSIS — Z431 Encounter for attention to gastrostomy: Secondary | ICD-10-CM | POA: Diagnosis not present

## 2015-09-22 DIAGNOSIS — G40909 Epilepsy, unspecified, not intractable, without status epilepticus: Secondary | ICD-10-CM | POA: Diagnosis not present

## 2015-09-22 DIAGNOSIS — I63032 Cerebral infarction due to thrombosis of left carotid artery: Secondary | ICD-10-CM | POA: Diagnosis not present

## 2015-09-22 DIAGNOSIS — N183 Chronic kidney disease, stage 3 (moderate): Secondary | ICD-10-CM | POA: Diagnosis not present

## 2015-09-22 DIAGNOSIS — I69320 Aphasia following cerebral infarction: Secondary | ICD-10-CM | POA: Diagnosis not present

## 2015-09-22 DIAGNOSIS — I129 Hypertensive chronic kidney disease with stage 1 through stage 4 chronic kidney disease, or unspecified chronic kidney disease: Secondary | ICD-10-CM | POA: Diagnosis not present

## 2015-09-22 DIAGNOSIS — I69351 Hemiplegia and hemiparesis following cerebral infarction affecting right dominant side: Secondary | ICD-10-CM | POA: Diagnosis not present

## 2015-09-22 DIAGNOSIS — I6900 Unspecified sequelae of nontraumatic subarachnoid hemorrhage: Secondary | ICD-10-CM | POA: Diagnosis not present

## 2015-09-25 ENCOUNTER — Other Ambulatory Visit (HOSPITAL_COMMUNITY): Payer: Medicare Other

## 2015-09-25 ENCOUNTER — Ambulatory Visit (HOSPITAL_COMMUNITY): Payer: Medicare Other | Admitting: Hematology & Oncology

## 2015-09-25 DIAGNOSIS — G40909 Epilepsy, unspecified, not intractable, without status epilepticus: Secondary | ICD-10-CM | POA: Diagnosis not present

## 2015-09-25 DIAGNOSIS — N183 Chronic kidney disease, stage 3 (moderate): Secondary | ICD-10-CM | POA: Diagnosis not present

## 2015-09-25 DIAGNOSIS — R627 Adult failure to thrive: Secondary | ICD-10-CM | POA: Diagnosis not present

## 2015-09-25 DIAGNOSIS — J690 Pneumonitis due to inhalation of food and vomit: Secondary | ICD-10-CM | POA: Diagnosis not present

## 2015-09-25 DIAGNOSIS — Z431 Encounter for attention to gastrostomy: Secondary | ICD-10-CM | POA: Diagnosis not present

## 2015-09-25 DIAGNOSIS — I69320 Aphasia following cerebral infarction: Secondary | ICD-10-CM | POA: Diagnosis not present

## 2015-09-25 DIAGNOSIS — I129 Hypertensive chronic kidney disease with stage 1 through stage 4 chronic kidney disease, or unspecified chronic kidney disease: Secondary | ICD-10-CM | POA: Diagnosis not present

## 2015-09-25 DIAGNOSIS — E039 Hypothyroidism, unspecified: Secondary | ICD-10-CM | POA: Diagnosis not present

## 2015-09-25 DIAGNOSIS — I69351 Hemiplegia and hemiparesis following cerebral infarction affecting right dominant side: Secondary | ICD-10-CM | POA: Diagnosis not present

## 2015-09-25 NOTE — Progress Notes (Signed)
This encounter was created in error - please disregard.

## 2015-09-27 DIAGNOSIS — I69320 Aphasia following cerebral infarction: Secondary | ICD-10-CM | POA: Diagnosis not present

## 2015-09-27 DIAGNOSIS — R627 Adult failure to thrive: Secondary | ICD-10-CM | POA: Diagnosis not present

## 2015-09-27 DIAGNOSIS — N183 Chronic kidney disease, stage 3 (moderate): Secondary | ICD-10-CM | POA: Diagnosis not present

## 2015-09-27 DIAGNOSIS — I129 Hypertensive chronic kidney disease with stage 1 through stage 4 chronic kidney disease, or unspecified chronic kidney disease: Secondary | ICD-10-CM | POA: Diagnosis not present

## 2015-09-27 DIAGNOSIS — E039 Hypothyroidism, unspecified: Secondary | ICD-10-CM | POA: Diagnosis not present

## 2015-09-27 DIAGNOSIS — Z431 Encounter for attention to gastrostomy: Secondary | ICD-10-CM | POA: Diagnosis not present

## 2015-09-27 DIAGNOSIS — J690 Pneumonitis due to inhalation of food and vomit: Secondary | ICD-10-CM | POA: Diagnosis not present

## 2015-09-27 DIAGNOSIS — G40909 Epilepsy, unspecified, not intractable, without status epilepticus: Secondary | ICD-10-CM | POA: Diagnosis not present

## 2015-09-27 DIAGNOSIS — I69351 Hemiplegia and hemiparesis following cerebral infarction affecting right dominant side: Secondary | ICD-10-CM | POA: Diagnosis not present

## 2015-09-29 DIAGNOSIS — R627 Adult failure to thrive: Secondary | ICD-10-CM | POA: Diagnosis not present

## 2015-09-29 DIAGNOSIS — I129 Hypertensive chronic kidney disease with stage 1 through stage 4 chronic kidney disease, or unspecified chronic kidney disease: Secondary | ICD-10-CM | POA: Diagnosis not present

## 2015-09-29 DIAGNOSIS — E039 Hypothyroidism, unspecified: Secondary | ICD-10-CM | POA: Diagnosis not present

## 2015-09-29 DIAGNOSIS — I69320 Aphasia following cerebral infarction: Secondary | ICD-10-CM | POA: Diagnosis not present

## 2015-09-29 DIAGNOSIS — G40909 Epilepsy, unspecified, not intractable, without status epilepticus: Secondary | ICD-10-CM | POA: Diagnosis not present

## 2015-09-29 DIAGNOSIS — I69351 Hemiplegia and hemiparesis following cerebral infarction affecting right dominant side: Secondary | ICD-10-CM | POA: Diagnosis not present

## 2015-09-29 DIAGNOSIS — J690 Pneumonitis due to inhalation of food and vomit: Secondary | ICD-10-CM | POA: Diagnosis not present

## 2015-09-29 DIAGNOSIS — Z431 Encounter for attention to gastrostomy: Secondary | ICD-10-CM | POA: Diagnosis not present

## 2015-09-29 DIAGNOSIS — N183 Chronic kidney disease, stage 3 (moderate): Secondary | ICD-10-CM | POA: Diagnosis not present

## 2015-09-30 DIAGNOSIS — N183 Chronic kidney disease, stage 3 (moderate): Secondary | ICD-10-CM | POA: Diagnosis not present

## 2015-09-30 DIAGNOSIS — I69959 Hemiplegia and hemiparesis following unspecified cerebrovascular disease affecting unspecified side: Secondary | ICD-10-CM | POA: Diagnosis not present

## 2015-10-02 DIAGNOSIS — G40909 Epilepsy, unspecified, not intractable, without status epilepticus: Secondary | ICD-10-CM | POA: Diagnosis not present

## 2015-10-02 DIAGNOSIS — I69320 Aphasia following cerebral infarction: Secondary | ICD-10-CM | POA: Diagnosis not present

## 2015-10-02 DIAGNOSIS — I129 Hypertensive chronic kidney disease with stage 1 through stage 4 chronic kidney disease, or unspecified chronic kidney disease: Secondary | ICD-10-CM | POA: Diagnosis not present

## 2015-10-02 DIAGNOSIS — J690 Pneumonitis due to inhalation of food and vomit: Secondary | ICD-10-CM | POA: Diagnosis not present

## 2015-10-02 DIAGNOSIS — Z431 Encounter for attention to gastrostomy: Secondary | ICD-10-CM | POA: Diagnosis not present

## 2015-10-02 DIAGNOSIS — N183 Chronic kidney disease, stage 3 (moderate): Secondary | ICD-10-CM | POA: Diagnosis not present

## 2015-10-02 DIAGNOSIS — R627 Adult failure to thrive: Secondary | ICD-10-CM | POA: Diagnosis not present

## 2015-10-02 DIAGNOSIS — I69351 Hemiplegia and hemiparesis following cerebral infarction affecting right dominant side: Secondary | ICD-10-CM | POA: Diagnosis not present

## 2015-10-02 DIAGNOSIS — E039 Hypothyroidism, unspecified: Secondary | ICD-10-CM | POA: Diagnosis not present

## 2015-10-04 ENCOUNTER — Telehealth: Payer: Self-pay

## 2015-10-04 ENCOUNTER — Emergency Department (HOSPITAL_COMMUNITY): Payer: Medicare Other

## 2015-10-04 ENCOUNTER — Encounter (HOSPITAL_COMMUNITY): Payer: Self-pay | Admitting: Emergency Medicine

## 2015-10-04 ENCOUNTER — Inpatient Hospital Stay (HOSPITAL_COMMUNITY)
Admission: EM | Admit: 2015-10-04 | Discharge: 2015-10-08 | DRG: 178 | Disposition: A | Discharge status: Home Health | Payer: Medicare Other | Attending: Internal Medicine | Admitting: Internal Medicine

## 2015-10-04 DIAGNOSIS — Z905 Acquired absence of kidney: Secondary | ICD-10-CM

## 2015-10-04 DIAGNOSIS — E876 Hypokalemia: Secondary | ICD-10-CM | POA: Diagnosis not present

## 2015-10-04 DIAGNOSIS — I63032 Cerebral infarction due to thrombosis of left carotid artery: Secondary | ICD-10-CM | POA: Diagnosis not present

## 2015-10-04 DIAGNOSIS — Z833 Family history of diabetes mellitus: Secondary | ICD-10-CM

## 2015-10-04 DIAGNOSIS — I639 Cerebral infarction, unspecified: Secondary | ICD-10-CM | POA: Diagnosis not present

## 2015-10-04 DIAGNOSIS — Z8249 Family history of ischemic heart disease and other diseases of the circulatory system: Secondary | ICD-10-CM | POA: Diagnosis not present

## 2015-10-04 DIAGNOSIS — G8191 Hemiplegia, unspecified affecting right dominant side: Secondary | ICD-10-CM

## 2015-10-04 DIAGNOSIS — I10 Essential (primary) hypertension: Secondary | ICD-10-CM | POA: Diagnosis present

## 2015-10-04 DIAGNOSIS — D472 Monoclonal gammopathy: Secondary | ICD-10-CM | POA: Diagnosis present

## 2015-10-04 DIAGNOSIS — I129 Hypertensive chronic kidney disease with stage 1 through stage 4 chronic kidney disease, or unspecified chronic kidney disease: Secondary | ICD-10-CM | POA: Diagnosis not present

## 2015-10-04 DIAGNOSIS — Z6828 Body mass index (BMI) 28.0-28.9, adult: Secondary | ICD-10-CM | POA: Diagnosis not present

## 2015-10-04 DIAGNOSIS — J189 Pneumonia, unspecified organism: Secondary | ICD-10-CM | POA: Diagnosis not present

## 2015-10-04 DIAGNOSIS — E870 Hyperosmolality and hypernatremia: Secondary | ICD-10-CM | POA: Diagnosis not present

## 2015-10-04 DIAGNOSIS — Z800 Family history of malignant neoplasm of digestive organs: Secondary | ICD-10-CM

## 2015-10-04 DIAGNOSIS — E039 Hypothyroidism, unspecified: Secondary | ICD-10-CM | POA: Diagnosis not present

## 2015-10-04 DIAGNOSIS — N183 Chronic kidney disease, stage 3 unspecified: Secondary | ICD-10-CM | POA: Diagnosis present

## 2015-10-04 DIAGNOSIS — R509 Fever, unspecified: Secondary | ICD-10-CM | POA: Diagnosis not present

## 2015-10-04 DIAGNOSIS — C9000 Multiple myeloma not having achieved remission: Secondary | ICD-10-CM | POA: Diagnosis present

## 2015-10-04 DIAGNOSIS — Z66 Do not resuscitate: Secondary | ICD-10-CM | POA: Diagnosis present

## 2015-10-04 DIAGNOSIS — Z431 Encounter for attention to gastrostomy: Secondary | ICD-10-CM | POA: Diagnosis not present

## 2015-10-04 DIAGNOSIS — I6900 Unspecified sequelae of nontraumatic subarachnoid hemorrhage: Secondary | ICD-10-CM | POA: Diagnosis not present

## 2015-10-04 DIAGNOSIS — R569 Unspecified convulsions: Secondary | ICD-10-CM

## 2015-10-04 DIAGNOSIS — Z931 Gastrostomy status: Secondary | ICD-10-CM

## 2015-10-04 DIAGNOSIS — Y95 Nosocomial condition: Secondary | ICD-10-CM | POA: Diagnosis present

## 2015-10-04 DIAGNOSIS — J690 Pneumonitis due to inhalation of food and vomit: Secondary | ICD-10-CM | POA: Diagnosis not present

## 2015-10-04 DIAGNOSIS — I69354 Hemiplegia and hemiparesis following cerebral infarction affecting left non-dominant side: Secondary | ICD-10-CM | POA: Diagnosis not present

## 2015-10-04 DIAGNOSIS — R1311 Dysphagia, oral phase: Secondary | ICD-10-CM | POA: Diagnosis not present

## 2015-10-04 DIAGNOSIS — R627 Adult failure to thrive: Secondary | ICD-10-CM | POA: Diagnosis not present

## 2015-10-04 DIAGNOSIS — R4701 Aphasia: Secondary | ICD-10-CM | POA: Diagnosis not present

## 2015-10-04 DIAGNOSIS — E669 Obesity, unspecified: Secondary | ICD-10-CM | POA: Diagnosis not present

## 2015-10-04 DIAGNOSIS — I69351 Hemiplegia and hemiparesis following cerebral infarction affecting right dominant side: Secondary | ICD-10-CM

## 2015-10-04 DIAGNOSIS — Z7401 Bed confinement status: Secondary | ICD-10-CM | POA: Diagnosis not present

## 2015-10-04 DIAGNOSIS — I69320 Aphasia following cerebral infarction: Secondary | ICD-10-CM | POA: Diagnosis not present

## 2015-10-04 DIAGNOSIS — R279 Unspecified lack of coordination: Secondary | ICD-10-CM | POA: Diagnosis not present

## 2015-10-04 DIAGNOSIS — I69391 Dysphagia following cerebral infarction: Secondary | ICD-10-CM | POA: Diagnosis not present

## 2015-10-04 DIAGNOSIS — J8410 Pulmonary fibrosis, unspecified: Secondary | ICD-10-CM | POA: Diagnosis present

## 2015-10-04 DIAGNOSIS — G40909 Epilepsy, unspecified, not intractable, without status epilepticus: Secondary | ICD-10-CM | POA: Diagnosis not present

## 2015-10-04 DIAGNOSIS — Z8673 Personal history of transient ischemic attack (TIA), and cerebral infarction without residual deficits: Secondary | ICD-10-CM | POA: Diagnosis not present

## 2015-10-04 DIAGNOSIS — E785 Hyperlipidemia, unspecified: Secondary | ICD-10-CM | POA: Diagnosis present

## 2015-10-04 DIAGNOSIS — R000 Tachycardia, unspecified: Secondary | ICD-10-CM | POA: Diagnosis not present

## 2015-10-04 DIAGNOSIS — Z85528 Personal history of other malignant neoplasm of kidney: Secondary | ICD-10-CM

## 2015-10-04 DIAGNOSIS — M6281 Muscle weakness (generalized): Secondary | ICD-10-CM | POA: Diagnosis not present

## 2015-10-04 DIAGNOSIS — R1310 Dysphagia, unspecified: Secondary | ICD-10-CM | POA: Diagnosis present

## 2015-10-04 DIAGNOSIS — R05 Cough: Secondary | ICD-10-CM | POA: Diagnosis not present

## 2015-10-04 DIAGNOSIS — I6930 Unspecified sequelae of cerebral infarction: Secondary | ICD-10-CM

## 2015-10-04 LAB — URINALYSIS, ROUTINE W REFLEX MICROSCOPIC
Bilirubin Urine: NEGATIVE
Glucose, UA: NEGATIVE mg/dL
Hgb urine dipstick: NEGATIVE
Ketones, ur: NEGATIVE mg/dL
LEUKOCYTES UA: NEGATIVE
NITRITE: NEGATIVE
Protein, ur: 100 mg/dL — AB
SPECIFIC GRAVITY, URINE: 1.025 (ref 1.005–1.030)
UROBILINOGEN UA: 1 mg/dL (ref 0.0–1.0)
pH: 5.5 (ref 5.0–8.0)

## 2015-10-04 LAB — CBC WITH DIFFERENTIAL/PLATELET
BASOS ABS: 0 10*3/uL (ref 0.0–0.1)
BASOS PCT: 0 %
Eosinophils Absolute: 0 10*3/uL (ref 0.0–0.7)
Eosinophils Relative: 0 %
HEMATOCRIT: 37.7 % (ref 36.0–46.0)
HEMOGLOBIN: 13 g/dL (ref 12.0–15.0)
Lymphocytes Relative: 15 %
Lymphs Abs: 2.2 10*3/uL (ref 0.7–4.0)
MCH: 33 pg (ref 26.0–34.0)
MCHC: 34.5 g/dL (ref 30.0–36.0)
MCV: 95.7 fL (ref 78.0–100.0)
MONOS PCT: 8 %
Monocytes Absolute: 1.2 10*3/uL — ABNORMAL HIGH (ref 0.1–1.0)
NEUTROS ABS: 10.7 10*3/uL — AB (ref 1.7–7.7)
NEUTROS PCT: 77 %
Platelets: 210 10*3/uL (ref 150–400)
RBC: 3.94 MIL/uL (ref 3.87–5.11)
RDW: 18.5 % — ABNORMAL HIGH (ref 11.5–15.5)
WBC: 14.1 10*3/uL — ABNORMAL HIGH (ref 4.0–10.5)

## 2015-10-04 LAB — COMPREHENSIVE METABOLIC PANEL
ALBUMIN: 2.8 g/dL — AB (ref 3.5–5.0)
ALK PHOS: 77 U/L (ref 38–126)
ALT: 31 U/L (ref 14–54)
ANION GAP: 6 (ref 5–15)
AST: 23 U/L (ref 15–41)
BUN: 23 mg/dL — ABNORMAL HIGH (ref 6–20)
CALCIUM: 9.5 mg/dL (ref 8.9–10.3)
CO2: 33 mmol/L — AB (ref 22–32)
Chloride: 108 mmol/L (ref 101–111)
Creatinine, Ser: 0.66 mg/dL (ref 0.44–1.00)
GFR calc non Af Amer: >60 mL/min (ref >60)
Glucose, Bld: 117 mg/dL — ABNORMAL HIGH (ref 65–99)
Potassium: 3.4 mmol/L — ABNORMAL LOW (ref 3.5–5.1)
SODIUM: 147 mmol/L — AB (ref 135–145)
Total Bilirubin: 0.7 mg/dL (ref 0.3–1.2)
Total Protein: 8.1 g/dL (ref 6.5–8.1)

## 2015-10-04 LAB — URINE MICROSCOPIC-ADD ON

## 2015-10-04 LAB — I-STAT CG4 LACTIC ACID, ED: Lactic Acid, Venous: 1.14 mmol/L (ref 0.5–2.0)

## 2015-10-04 LAB — TSH: TSH: 0.606 u[IU]/mL (ref 0.350–4.500)

## 2015-10-04 LAB — CBG MONITORING, ED: Glucose-Capillary: 93 mg/dL (ref 65–99)

## 2015-10-04 MED ORDER — CLONAZEPAM 0.5 MG PO TABS
0.5000 mg | ORAL_TABLET | Freq: Three times a day (TID) | ORAL | Status: DC | PRN
  Administered 2015-10-06: 0.5 mg
  Filled 2015-10-04: qty 1

## 2015-10-04 MED ORDER — PROCHLORPERAZINE MALEATE 5 MG PO TABS: 10.0000 mg | ORAL_TABLET | Freq: Four times a day (QID) | ORAL | Status: DC | PRN

## 2015-10-04 MED ORDER — LEVOTHYROXINE SODIUM 75 MCG PO TABS: 37.5000 ug | ORAL_TABLET | Freq: Every day | ORAL | Status: DC

## 2015-10-04 MED ORDER — PIPERACILLIN-TAZOBACTAM 3.375 G IVPB 30 MIN
3.3750 g | Freq: Once | INTRAVENOUS | Status: AC
Start: 2015-10-04 — End: 2015-10-04
  Administered 2015-10-04: 3.375 g via INTRAVENOUS
  Filled 2015-10-04: qty 50

## 2015-10-04 MED ORDER — CARVEDILOL 3.125 MG PO TABS
6.2500 mg | ORAL_TABLET | Freq: Two times a day (BID) | ORAL | Status: DC
  Administered 2015-10-05 – 2015-10-07 (×6): 6.25 mg
  Filled 2015-10-04 (×6): qty 2

## 2015-10-04 MED ORDER — PIPERACILLIN-TAZOBACTAM 3.375 G IVPB 30 MIN: 3.3750 g | Freq: Three times a day (TID) | INTRAVENOUS | Status: DC

## 2015-10-04 MED ORDER — LEVETIRACETAM 100 MG/ML PO SOLN
1000.0000 mg | Freq: Two times a day (BID) | ORAL | Status: DC
  Administered 2015-10-04 – 2015-10-08 (×8): 1000 mg
  Filled 2015-10-04 (×10): qty 10

## 2015-10-04 MED ORDER — ONDANSETRON HCL 4 MG/2ML IJ SOLN: 4.0000 mg | Freq: Three times a day (TID) | INTRAMUSCULAR | Status: DC | PRN

## 2015-10-04 MED ORDER — ACETAMINOPHEN 160 MG/5ML PO SOLN
650.0000 mg | Freq: Four times a day (QID) | ORAL | Status: DC | PRN
  Administered 2015-10-05: 650 mg via ORAL
  Filled 2015-10-04: qty 20.3

## 2015-10-04 MED ORDER — CLOPIDOGREL BISULFATE 75 MG PO TABS
75.0000 mg | ORAL_TABLET | Freq: Every day | ORAL | Status: DC
  Administered 2015-10-05 – 2015-10-08 (×4): 75 mg
  Filled 2015-10-04 (×4): qty 1

## 2015-10-04 MED ORDER — LACOSAMIDE 10 MG/ML PO SOLN: 150.0000 mg | Freq: Two times a day (BID) | ORAL | Status: DC

## 2015-10-04 MED ORDER — LEVOTHYROXINE SODIUM 75 MCG PO TABS
75.0000 ug | ORAL_TABLET | ORAL | Status: DC
  Administered 2015-10-05 – 2015-10-06 (×2): 75 ug via ORAL
  Filled 2015-10-04 (×2): qty 1

## 2015-10-04 MED ORDER — PIPERACILLIN-TAZOBACTAM 3.375 G IVPB
3.3750 g | Freq: Three times a day (TID) | INTRAVENOUS | Status: DC
  Administered 2015-10-05 – 2015-10-08 (×10): 3.375 g via INTRAVENOUS
  Filled 2015-10-04 (×14): qty 50

## 2015-10-04 MED ORDER — VANCOMYCIN HCL IN DEXTROSE 1-5 GM/200ML-% IV SOLN
INTRAVENOUS | Status: AC
  Filled 2015-10-04: qty 200

## 2015-10-04 MED ORDER — FREE WATER
100.0000 mL | Status: DC
  Administered 2015-10-04 – 2015-10-05 (×4): 100 mL

## 2015-10-04 MED ORDER — ACETAMINOPHEN ER 650 MG PO TBCR: 650.0000 mg | EXTENDED_RELEASE_TABLET | Freq: Three times a day (TID) | ORAL | Status: DC | PRN

## 2015-10-04 MED ORDER — PRAVASTATIN SODIUM 40 MG PO TABS
40.0000 mg | ORAL_TABLET | Freq: Every day | ORAL | Status: DC
  Administered 2015-10-05 – 2015-10-07 (×4): 40 mg via ORAL
  Filled 2015-10-04 (×4): qty 1

## 2015-10-04 MED ORDER — PIPERACILLIN-TAZOBACTAM 3.375 G IVPB
INTRAVENOUS | Status: AC
  Filled 2015-10-04: qty 50

## 2015-10-04 MED ORDER — SODIUM CHLORIDE 0.9 % IJ SOLN
3.0000 mL | Freq: Two times a day (BID) | INTRAMUSCULAR | Status: DC
  Administered 2015-10-05 – 2015-10-08 (×6): 3 mL via INTRAVENOUS

## 2015-10-04 MED ORDER — GLUCERNA 1.2 CAL PO LIQD
237.0000 mL | Freq: Every day | ORAL | Status: DC
  Administered 2015-10-04 – 2015-10-08 (×18): 237 mL
  Filled 2015-10-04 (×2): qty 1000
  Filled 2015-10-04 (×2): qty 237
  Filled 2015-10-04 (×7): qty 1000
  Filled 2015-10-04: qty 237
  Filled 2015-10-04 (×9): qty 1000
  Filled 2015-10-04: qty 237
  Filled 2015-10-04 (×2): qty 1000

## 2015-10-04 MED ORDER — ALBUTEROL SULFATE (2.5 MG/3ML) 0.083% IN NEBU
2.5000 mg | INHALATION_SOLUTION | Freq: Four times a day (QID) | RESPIRATORY_TRACT | Status: DC | PRN
  Administered 2015-10-07 – 2015-10-08 (×3): 2.5 mg via RESPIRATORY_TRACT
  Filled 2015-10-04 (×3): qty 3

## 2015-10-04 MED ORDER — LEVOTHYROXINE SODIUM 75 MCG PO TABS
37.5000 ug | ORAL_TABLET | ORAL | Status: DC
  Administered 2015-10-07 – 2015-10-08 (×2): 37.5 ug via ORAL
  Filled 2015-10-04 (×2): qty 1

## 2015-10-04 MED ORDER — VITAMIN D (ERGOCALCIFEROL) 1.25 MG (50000 UNIT) PO CAPS
50000.0000 [IU] | ORAL_CAPSULE | ORAL | Status: DC
  Administered 2015-10-06: 50000 [IU]
  Filled 2015-10-04: qty 1

## 2015-10-04 MED ORDER — ENOXAPARIN SODIUM 40 MG/0.4ML ~~LOC~~ SOLN
40.0000 mg | SUBCUTANEOUS | Status: DC
  Administered 2015-10-05 – 2015-10-07 (×4): 40 mg via SUBCUTANEOUS
  Filled 2015-10-04 (×4): qty 0.4

## 2015-10-04 MED ORDER — VANCOMYCIN HCL IN DEXTROSE 1-5 GM/200ML-% IV SOLN
1000.0000 mg | Freq: Two times a day (BID) | INTRAVENOUS | Status: DC
  Administered 2015-10-05 – 2015-10-08 (×7): 1000 mg via INTRAVENOUS
  Filled 2015-10-04 (×9): qty 200

## 2015-10-04 MED ORDER — VANCOMYCIN HCL IN DEXTROSE 1-5 GM/200ML-% IV SOLN
1000.0000 mg | Freq: Once | INTRAVENOUS | Status: AC
  Administered 2015-10-04: 1000 mg via INTRAVENOUS
  Filled 2015-10-04: qty 200

## 2015-10-04 MED ORDER — AMLODIPINE BESYLATE 5 MG PO TABS
10.0000 mg | ORAL_TABLET | Freq: Every day | ORAL | Status: DC
  Administered 2015-10-05 – 2015-10-08 (×4): 10 mg
  Filled 2015-10-04 (×4): qty 2

## 2015-10-04 MED ORDER — SODIUM CHLORIDE 0.9 % IV SOLN: INTRAVENOUS | Status: DC

## 2015-10-04 NOTE — H&P (Signed)
[facility] History and Physical  Melissa [name] ZOX:[id number] DOB: 10-01-1946    PCP:   [name], MD   Chief Complaint: Brought in from home by daughters for coughing thick sputum and fever.   HPI: Melissa [name] is an 69 y.o. 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  She did have some nausea and vomiting last week.  No seizure noted.  Work up in the ER with CXR showed vascular congestions and bilateral infiltrates, leukocytosis with WBC of 14K, but normal electrolytes and renal fx test.  She has no respiratory distress.  In the ER, she was given IV Van/Zosyn, and hospitalist was asked to admit her for aspiration PNA.  She is a DNR.   Rewiew of Systems: Unable .   Past Medical History  Diagnosis Date  . Vertigo, intermittent   . Hypothyroidism   . Hyperlipidemia   . Obesity   . Hypertension     severe and resistant to treatment; 2008-negative left renal angiogram  . Vitiligo   . Degenerative disc disease, lumbar   . Degenerative disc disease, cervical   . Chest pain 2008    2008-normal coronary angiography  . Stroke (HCC) 09/18/2014    right hemiparesis  . Renal cell carcinoma     Right nephrectomy  . Multiple myeloma (HCC) 08/26/2014    Past Surgical History  Procedure Laterality Date  . Tubal ligation  1983    Bilateral  . Nephrectomy      Right; secondary to renal cell carcinoma  . Colonoscopy N/A 04/21/2014    Procedure: COLONOSCOPY;  Surgeon: Malissa Hippo, MD;  Location: [facility];  Service: Endoscopy;  Laterality: N/A;  . Colonoscopy N/A 04/21/2014    Procedure:  COLONOSCOPY;  Surgeon: Malissa Hippo, MD;  Location: [facility];  Service: Endoscopy;  Laterality: N/A;  1030  . Tee without cardioversion N/A 09/23/2014    Procedure: TRANSESOPHAGEAL ECHOCARDIOGRAM (TEE);  Surgeon: Thurmon Fair, MD;  Location: [facility] ENDOSCOPY;  Service: Cardiovascular;  Laterality: N/A;    Medications:  HOME MEDS: Prior to Admission medications   Medication Sig Start Date End Date Taking? Authorizing Provider  acetaminophen (TYLENOL) 650 MG CR tablet 650 mg by Feeding Tube route every 8 (eight) hours as needed for pain.    Yes Historical Provider, MD  albuterol (PROVENTIL) (2.5 MG/3ML) 0.083% nebulizer solution Take 2.5 mg by nebulization every 6 (six) hours as needed for wheezing or shortness of breath.   Yes Historical Provider, MD  amLODipine (NORVASC) 10 MG tablet Place 1 tablet (10 mg total) into feeding tube daily. 09/09/15  Yes Standley Brooking, MD  carvedilol (COREG) 6.25 MG tablet TAKE ONE TABLET INTO FEEDING TUBE TWICE DAILY WITH MEALS 06/29/15  Yes Kerri Perches, MD  Cholecalciferol (VITAMIN D3) 50000 UNITS TABS Place 1 tablet into feeding tube once a week. By feeding tube 09/09/15  Yes Standley Brooking, MD  clonazePAM (KLONOPIN) 0.5 MG tablet Place 1 tablet (0.5 mg total) into feeding tube 3 (three) times daily as needed. Patient taking differently: Place 0.5 mg into feeding tube 3 (three) times  daily as needed for anxiety.  08/03/15  Yes Christiane Ha, MD  clopidogrel (PLAVIX) 75 MG tablet Place 1 tablet (75 mg total) into feeding tube daily. 09/09/15  Yes Standley Brooking, MD  DULoxetine (CYMBALTA) 30 MG capsule 30 mg by Feeding Tube route 2 (two) times daily.  05/23/15  Yes Historical Provider, MD  HYDROcodone-acetaminophen (NORCO/VICODIN) 5-325 MG per tablet Place 1 tablet into feeding tube 2 (two) times daily as needed for moderate pain. 09/09/15  Yes Standley Brooking, MD  Lacosamide 150 MG TABS Place 1 tablet (150 mg total) into feeding tube 2 (two)  times daily. 09/09/15  Yes Standley Brooking, MD  levETIRAcetam (KEPPRA) 100 MG/ML solution Place 10 mLs (1,000 mg total) into feeding tube 2 (two) times daily. 09/09/15  Yes Standley Brooking, MD  levothyroxine (SYNTHROID, LEVOTHROID) 75 MCG tablet One tablet mon-fri and one half tab on sat and sun Patient taking differently: Take 37.5-75 mcg by mouth daily before breakfast. take one half tablet sat and sun and 1 tab mon- fri 04/05/15  Yes Kerri Perches, MD  lovastatin (MEVACOR) 40 MG tablet Place 1 tablet (40 mg total) into feeding tube at bedtime. 10/01/14  Yes Ripudeep Jenna Luo, MD  Nutritional Supplements (FEEDING SUPPLEMENT, OSMOLITE 1.5 CAL,) LIQD Place 237 mLs into feeding tube 5 (five) times daily. 08/03/15  Yes Christiane Ha, MD  phenytoin (DILANTIN) 125 MG/5ML suspension Place 4 mLs (100 mg total) into feeding tube 3 (three) times daily. Patient taking differently: Place 100 mg into feeding tube daily.  09/09/15  Yes Standley Brooking, MD  PROAIR HFA 108 [phone] BASE) MCG/ACT inhaler Inhale 1-2 puffs into the lungs every 6 (six) hours as needed for wheezing or shortness of breath.  08/31/15  Yes Historical Provider, MD  prochlorperazine (COMPAZINE) 10 MG tablet 10 mg by Feeding Tube route every 6 (six) hours as needed for nausea or vomiting.    Yes Historical Provider, MD  VIMPAT 10 MG/ML oral solution 150 mg by Feeding Tube route 2 (two) times daily. 09/22/15  Yes Historical Provider, MD  Water For Irrigation, Sterile (FREE WATER) SOLN Place 100 mLs into feeding tube every 4 (four) hours. 10/01/14  Yes Ripudeep Jenna Luo, MD  Amino Acids-Protein Hydrolys (FEEDING SUPPLEMENT, PRO-STAT SUGAR FREE 64,) LIQD Place 30 mLs into feeding tube daily. Patient not taking: Reported on 10/04/2015 09/09/15   Standley Brooking, MD  amoxicillin-clavulanate (AUGMENTIN) 400-57 MG/5ML suspension Place 10 mLs (800 mg total) into feeding tube every 12 (twelve) hours. Patient not taking: Reported on 10/04/2015 09/09/15    Standley Brooking, MD  cloNIDine (CATAPRES - DOSED IN MG/24 HR) 0.3 mg/24hr patch APPLY 1 PATCH TOPICALLY ONCE A WEEK 07/11/15   Kerri Perches, MD     Allergies:  Allergies  Allergen Reactions  . Morphine Nausea And Vomiting    Social History:   reports that she has never smoked. She has never used smokeless tobacco. She reports that she does not drink alcohol or use illicit drugs.  Family History: Family History  Problem Relation Age of Onset  . Heart disease Mother 59  . Hypertension Mother   . Colon cancer Father   . Hypertension Sister     x2  . Diabetes Sister     x2  . Heart failure Sister   . Lupus Brother      Physical Exam: Filed Vitals:   10/04/15 1815 10/04/15 1830 10/04/15 1845 10/04/15 1900  BP:  126/73  117/53  Pulse: 92 90 92 88  Temp:      TempSrc:      Resp: 21 21 18 19   Weight:      SpO2: 100% 100% 100% 100%   Blood pressure 117/53, pulse 88, temperature 100 F (37.8 C), temperature source Rectal, resp. rate 19, weight 85.73 kg (189 lb), SpO2 100 %.  GEN:  Pleasant patient lying in the stretcher in no acute distress; cooperative with exam. PSYCH:  Not interacting with  does not appear anxious or depressed; affect is appropriate. HEENT: Mucous membranes pink and anicteric; PERRLA; EOM intact; no cervical lymphadenopathy nor thyromegaly or carotid bruit; no JVD; There were no stridor. Neck is very supple. Breasts:: Not examined CHEST WALL: No tenderness CHEST: Normal respiration, clear to auscultation bilaterally.  HEART: Regular rate and rhythm.  There are no murmur, rub, or gallops.   BACK: No kyphosis or scoliosis; no CVA tenderness ABDOMEN: soft and non-tender; no masses, no organomegaly, normal abdominal bowel sounds; no pannus; no intertriginous candida. There is no rebound and no distention. Rectal Exam: Not done EXTREMITIES: No bone or joint deformity; age-appropriate arthropathy of the hands and knees; no edema; no ulcerations.  There  is no calf tenderness. Genitalia: not examined PULSES: 2+ and symmetric SKIN: Normal hydration no rash or ulceration WUJ:WJXBJ dense hemplegia, left hemiparesis.   Labs on Admission:  Basic Metabolic Panel:  Recent Labs Lab 10/04/15 1755  NA 147*  K 3.4*  CL 108  CO2 33*  GLUCOSE 117*  BUN 23*  CREATININE 0.66  CALCIUM 9.5   Liver Function Tests:  Recent Labs Lab 10/04/15 1755  AST 23  ALT 31  ALKPHOS 77  BILITOT 0.7  PROT 8.1  ALBUMIN 2.8*   CBC:  Recent Labs Lab 10/04/15 1755  WBC 14.1*  NEUTROABS 10.7*  HGB 13.0  HCT 37.7  MCV 95.7  PLT 210   Cardiac Enzymes: No results for input(s): CKTOTAL, CKMB, CKMBINDEX, TROPONINI in the last 168 hours.  CBG:  Recent Labs Lab 10/04/15 1726  GLUCAP 93     Radiological Exams on Admission: Dg Chest Port 1 View  10/04/2015  CLINICAL DATA:  Altered mental status, fever since yesterday. Nonproductive cough for 3 days. EXAM: PORTABLE CHEST 1 VIEW COMPARISON:  09/04/2015 FINDINGS: There is cardiomegaly with vascular congestion. Low lung volumes. Bibasilar opacities, left greater than right. Cannot exclude pneumonia, particularly in the left base. No visible effusions. No acute bony abnormality. IMPRESSION: Cardiomegaly with vascular congestion. Low lung volumes. Bilateral lower lobe airspace opacities, left greater than right. Cannot exclude pneumonia. Electronically Signed   By: Charlett Nose M.D.   On: 10/04/2015 17:55    Assessment/Plan Present on Admission:  . Aspiration pneumonia (HCC) . CKD (chronic kidney disease) stage 3, GFR 30-59 ml/min . CVA (cerebral infarction) . HTN (hypertension) . Hypertension . Hypothyroid . HCAP (healthcare-associated pneumonia)  PLAN: Patient will be admitted for PNA, suspicious for aspiration pneumonia.  She will be given IV Van/Zosyn per pharmacy dosing.  Her hx of CKD is actually has improved, as her Cr is only 0.6.  For her seizure, since the intention per Dr Gerilyn Pilgrim was  to taper and discontinue Dilantin, will d/c it.  I will continue her other two anticonsulsive medications.  For her hypothyrodism, will continue with her supplement thru TF, and check TSH.  For her HTN, will give some of her meds, but with her infection and possible early sepsis, will hold her catapress.  She is placed on  strict NPO as before, and continue with her Jevity 1.5, 5 cans per day.  I spoke with her daughters and her sister at her bedside, and confirmed her DNR code status.  Thank you for allowing me to participate in the care of your patient.  Good Day.   Other plans as per orders.  Code Status: DNR.    Houston Siren, MD. [facility] Pager [phone] 7pm to 7am.  10/04/2015, 7:55 PM

## 2015-10-04 NOTE — ED Provider Notes (Signed)
The patient is a 69 year old female, history of prior stroke, bedbound, has a feeding tube in the supraumbilical area, reportedly had a fever today. Paramedics report that she is at her baseline mental status, fever reported to be 101. No other history at this time, the patient is unable to give any history thus a level V caveat applies. Will obtain further information from family on their arrival.  On exam the patient is obese, bedbound, mental status is somnolent but arousable to painful stimuli, she does not follow commands however she does retract bilateral lower extremities to painful stimuli, she retracts her right upper extremity slightly to painful stimuli, she is able to open both of her eyes and appears to have facial droop. Her mucous membranes are moist, there is phlegm in the mouth, she is not in distress, has no tachycardia, pulse of 95. Lungs overall are clear, she is not tachypneic.  Check chest x-ray, urinalysis, labs, evaluate for source of fever. Sepsis protocol ordered   EKG Interpretation  Date/Time:  Wednesday October 04 2015 17:26:46 EDT Ventricular Rate:  98 PR Interval:  147 QRS Duration: 95 QT Interval:  365 QTC Calculation: 466 R Axis:   87 Text Interpretation:  Sinus tachycardia Multiform ventricular premature complexes Probable left atrial enlargement Borderline right axis deviation Left ventricular hypertrophy Since last tracing Rate faster Confirmed by Alysiah Suppa  MD, Jyoti Harju ([id number]) on 10/04/2015 5:34:09 PM       I have personally viewed and interpreted the imaging and agree with radiologist interpretation.   Medical screening examination/treatment/procedure(s) were conducted as a shared visit with non-physician practitioner(s) and myself.  I personally evaluated the patient during the encounter.  Clinical Impression:   Final diagnoses:  HCAP (healthcare-associated pneumonia)         Noemi Chapel, MD 10/06/15 [phone]

## 2015-10-04 NOTE — Progress Notes (Signed)
Pharmacy Note:  Initial antibiotics for Vancomycin and Zosyn ordered by EDP for potential HCAP.  CrCl cannot be calculated (Patient has no serum creatinine result on file.).   Allergies  Allergen Reactions  . Morphine Nausea And Vomiting    Filed Vitals:   10/04/15 1800  BP: 139/69  Pulse: 94  Temp:   Resp: 22    Anti-infectives    Start     Dose/Rate Route Frequency Ordered Stop   10/04/15 2200  piperacillin-tazobactam (ZOSYN) IVPB 3.375 g     3.375 g 100 mL/hr over 30 Minutes Intravenous 3 times per day 10/04/15 1846     10/04/15 1915  vancomycin (VANCOCIN) IVPB 1000 mg/200 mL premix     1,000 mg 200 mL/hr over 60 Minutes Intravenous  Once 10/04/15 1859        Plan: Labs pending. Initial doses of Vancomycin 1gm and Zosyn 3.375gm X 1 ordered. F/U admission orders for further dosing if therapy continued.  Pricilla Larsson, [facility] 10/04/2015 7:02 PM

## 2015-10-04 NOTE — Progress Notes (Signed)
ANTIBIOTIC CONSULT NOTE - FOLLOW UP  Pharmacy Consult for Vancomycin and Zosyn  Indication: pneumonia  Allergies  Allergen Reactions  . Morphine Nausea And Vomiting    Patient Measurements: Height: 5\' 6"  (167.6 cm) Weight: 189 lb (85.73 kg) IBW/kg (Calculated) : 59.3  Vital Signs: Temp: 100 F (37.8 C) (10/12 1723) Temp Source: Rectal (10/12 1723) BP: 164/94 mmHg (10/12 2030) Pulse Rate: 94 (10/12 2030) Intake/Output from previous day:   Intake/Output from this shift:    Labs:  Recent Labs  10/04/15 1755  WBC 14.1*  HGB 13.0  PLT 210  CREATININE 0.66   Estimated Creatinine Clearance: 73.2 mL/min (by C-G formula based on Cr of 0.66). No results for input(s): VANCOTROUGH, VANCOPEAK, VANCORANDOM, GENTTROUGH, GENTPEAK, GENTRANDOM, TOBRATROUGH, TOBRAPEAK, TOBRARND, AMIKACINPEAK, AMIKACINTROU, AMIKACIN in the last 72 hours.   Microbiology: No results found for this or any previous visit (from the past 720 hour(s)).  Anti-infectives    Start     Dose/Rate Route Frequency Ordered Stop   10/04/15 2200  piperacillin-tazobactam (ZOSYN) IVPB 3.375 g  Status:  Discontinued     3.375 g 100 mL/hr over 30 Minutes Intravenous 3 times per day 10/04/15 1846 10/04/15 1908   10/04/15 1915  vancomycin (VANCOCIN) IVPB 1000 mg/200 mL premix     1,000 mg 200 mL/hr over 60 Minutes Intravenous  Once 10/04/15 1859     10/04/15 1915  piperacillin-tazobactam (ZOSYN) IVPB 3.375 g     3.375 g 100 mL/hr over 30 Minutes Intravenous  Once 10/04/15 1908 10/04/15 1945      Assessment: Okay for Protocol, initial doses given in ED. Eradicate infection.   Goal of Therapy:  Vancomycin trough level 15-20 mcg/ml   Plan:  Vancomycin 1gm IV every 12 hours. Zosyn 3.375gm IV every 8 hours. Measure antibiotic drug levels at steady state Follow up culture results  Pricilla Larsson 10/04/2015,8:32 PM

## 2015-10-04 NOTE — ED Provider Notes (Signed)
CSN: [id number]     Arrival date & time 10/04/15  1719 History   First MD Initiated Contact with Patient 10/04/15 1721     Chief Complaint  Patient presents with  . Fever     Patient is a 69 y.o. female presenting with fever. The history is limited by the condition of the patient.  Fever  [name] is a 69 year old female with history of stroke who is BIBEMS for evaluation of fever and cough. Per EMS, pt's daughter called EMS because pt "just got over a bout of pneumonia" when she started having a wet-sounding cough and fever of 101F for the past three days.   Spoke with patient's daughter after initial workup began. She states that pt started with a dry cough a few days ago which has steadily progressed to a wet-sounding cough with low-grade fever at home (44F). Her daughter states that at home she is nonverbal but will respond to verbal cues and to pain. 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 Patient's daughter states that her nonresponsive state is new today. Pt's daughter  is concerned about possible aspiration as she thinks she saw some vomit on pt's bed. States pt was admitted last month for aspiration pneumonia as well. Denies problems with g-tube.  Past Medical History  Diagnosis Date  . Vertigo, intermittent   . Hypothyroidism   . Hyperlipidemia   . Obesity   . Hypertension     severe and resistant to treatment; 2008-negative left renal angiogram  . Vitiligo   . Degenerative disc disease, lumbar   . Degenerative disc disease, cervical   . Chest pain 2008    2008-normal coronary angiography  . Stroke ([place]) 09/18/2014    right hemiparesis  . Renal cell carcinoma     Right nephrectomy  . Multiple myeloma ([place]) 08/26/2014   Past Surgical History  Procedure Laterality Date  . Tubal ligation  1983    Bilateral  . Nephrectomy      Right; secondary to renal cell carcinoma  . Colonoscopy N/A 04/21/2014    Procedure: COLONOSCOPY;  Surgeon: Rogene Houston, MD;  [facility]  [facility];  Service: Endoscopy;  Laterality: N/A;  . Colonoscopy N/A 04/21/2014    Procedure: COLONOSCOPY;  Surgeon: Rogene Houston, MD;  Location: [facility];  Service: Endoscopy;  Laterality: N/A;  1030  . Tee without cardioversion N/A 09/23/2014    Procedure: TRANSESOPHAGEAL ECHOCARDIOGRAM (TEE);  Surgeon: Sanda Klein, MD;  Location: [facility] ENDOSCOPY;  Service: Cardiovascular;  Laterality: N/A;   Family History  Problem Relation Age of Onset  . Heart disease Mother 29  . Hypertension Mother   . Colon cancer Father   . Hypertension Sister     x2  . Diabetes Sister     x2  . Heart failure Sister   . Lupus Brother    Social History  Substance Use Topics  . Smoking status: Never Smoker   . Smokeless tobacco: Never Used  . Alcohol Use: No   OB History    No data available     Review of Systems  Unable to perform ROS: Patient nonverbal  Constitutional: Positive for fever.      Allergies  Morphine  Home Medications   Prior to Admission medications   Medication Sig Start Date End Date Taking? Authorizing Provider  acetaminophen (TYLENOL) 650 MG CR tablet 650 mg by Feeding Tube route every 8 (eight) hours as needed for pain.  Yes Historical Provider, MD  albuterol (PROVENTIL) (2.5 MG/3ML) 0.083% nebulizer solution Take 2.5 mg by nebulization every 6 (six) hours as needed for wheezing or shortness of breath.   Yes Historical Provider, MD  amLODipine (NORVASC) 10 MG tablet Place 1 tablet (10 mg total) into feeding tube daily. 09/09/15  Yes Samuella Cota, MD  carvedilol (COREG) 6.25 MG tablet TAKE ONE TABLET INTO FEEDING TUBE TWICE DAILY WITH MEALS 06/29/15  Yes Fayrene Helper, MD  Cholecalciferol (VITAMIN D3) 50000 UNITS TABS Place 1 tablet into feeding tube once a week. By feeding tube 09/09/15  Yes Samuella Cota, MD  clonazePAM (KLONOPIN) 0.5 MG tablet Place 1 tablet (0.5 mg total) into feeding tube 3 (three) times daily as needed. Patient taking differently:  Place 0.5 mg into feeding tube 3 (three) times daily as needed for anxiety.  08/03/15  Yes Delfina Redwood, MD  clopidogrel (PLAVIX) 75 MG tablet Place 1 tablet (75 mg total) into feeding tube daily. 09/09/15  Yes Samuella Cota, MD  DULoxetine (CYMBALTA) 30 MG capsule 30 mg by Feeding Tube route 2 (two) times daily.  05/23/15  Yes Historical Provider, MD  HYDROcodone-acetaminophen (NORCO/VICODIN) 5-325 MG per tablet Place 1 tablet into feeding tube 2 (two) times daily as needed for moderate pain. 09/09/15  Yes Samuella Cota, MD  Lacosamide 150 MG TABS Place 1 tablet (150 mg total) into feeding tube 2 (two) times daily. 09/09/15  Yes Samuella Cota, MD  levETIRAcetam (KEPPRA) 100 MG/ML solution Place 10 mLs (1,000 mg total) into feeding tube 2 (two) times daily. 09/09/15  Yes Samuella Cota, MD  levothyroxine (SYNTHROID, LEVOTHROID) 75 MCG tablet One tablet mon-fri and one half tab on sat and sun Patient taking differently: Take 37.5-75 mcg by mouth daily before breakfast. take one half tablet sat and sun and 1 tab mon- fri 04/05/15  Yes Fayrene Helper, MD  lovastatin (MEVACOR) 40 MG tablet Place 1 tablet (40 mg total) into feeding tube at bedtime. 10/01/14  Yes Ripudeep Krystal Eaton, MD  Nutritional Supplements (FEEDING SUPPLEMENT, OSMOLITE 1.5 CAL,) LIQD Place 237 mLs into feeding tube 5 (five) times daily. 08/03/15  Yes Delfina Redwood, MD  phenytoin (DILANTIN) 125 MG/5ML suspension Place 4 mLs (100 mg total) into feeding tube 3 (three) times daily. Patient taking differently: Place 100 mg into feeding tube daily.  09/09/15  Yes Samuella Cota, MD  PROAIR HFA 108 [phone] BASE) MCG/ACT inhaler Inhale 1-2 puffs into the lungs every 6 (six) hours as needed for wheezing or shortness of breath.  08/31/15  Yes Historical Provider, MD  prochlorperazine (COMPAZINE) 10 MG tablet 10 mg by Feeding Tube route every 6 (six) hours as needed for nausea or vomiting.    Yes Historical Provider, MD  VIMPAT 10  MG/ML oral solution 150 mg by Feeding Tube route 2 (two) times daily. 09/22/15  Yes Historical Provider, MD  Water For Irrigation, Sterile (FREE WATER) SOLN Place 100 mLs into feeding tube every 4 (four) hours. 10/01/14  Yes Ripudeep Krystal Eaton, MD  Amino Acids-Protein Hydrolys (FEEDING SUPPLEMENT, PRO-STAT SUGAR FREE 64,) LIQD Place 30 mLs into feeding tube daily. Patient not taking: Reported on 10/04/2015 09/09/15   Samuella Cota, MD  amoxicillin-clavulanate (AUGMENTIN) 400-57 MG/5ML suspension Place 10 mLs (800 mg total) into feeding tube every 12 (twelve) hours. Patient not taking: Reported on 10/04/2015 09/09/15   Samuella Cota, MD  cloNIDine (CATAPRES - DOSED IN MG/24 HR) 0.3 mg/24hr patch APPLY  1 PATCH TOPICALLY ONCE A WEEK 07/11/15   Fayrene Helper, MD   BP 117/53 mmHg  Pulse 88  Temp(Src) 100 F (37.8 C) (Rectal)  Resp 19  Wt 189 lb (85.73 kg)  SpO2 100% Physical Exam  HENT:  Right Ear: External ear normal.  Left Ear: External ear normal.  Mouth/Throat: Oropharynx is clear and moist. No oropharyngeal exudate.  Eyes: Conjunctivae are normal. Pupils are equal, round, and reactive to light.  Neck: Neck supple. No tracheal deviation present.  Cardiovascular: Normal rate, regular rhythm, normal heart sounds and intact distal pulses.   No murmur heard. Pulmonary/Chest: Effort normal and breath sounds normal. No stridor. No respiratory distress. She has no wheezes.  Lung sounds difficult to discern 2/2 body habitus and pt mental status  Abdominal: Soft. Bowel sounds are normal.    g tube intact and unremarkable  Musculoskeletal: She exhibits no edema.  Lymphadenopathy:    She has no cervical adenopathy.  Neurological: She is unresponsive.  Skin: Skin is warm and dry. No rash noted. She is not diaphoretic. No erythema.  Nursing note and vitals reviewed.   ED Course  Procedures (including critical care time) Labs Review Labs Reviewed  COMPREHENSIVE METABOLIC PANEL -  Abnormal; Notable for the following:    Sodium 147 (*)    Potassium 3.4 (*)    CO2 33 (*)    Glucose, Bld 117 (*)    BUN 23 (*)    Albumin 2.8 (*)    All other components within normal limits  CBC WITH DIFFERENTIAL/PLATELET - Abnormal; Notable for the following:    WBC 14.1 (*)    RDW 18.5 (*)    Neutro Abs 10.7 (*)    Monocytes Absolute 1.2 (*)    All other components within normal limits  URINALYSIS, ROUTINE W REFLEX MICROSCOPIC (NOT AT [facility]) - Abnormal; Notable for the following:    Protein, ur 100 (*)    All other components within normal limits  URINE MICROSCOPIC-ADD ON - Abnormal; Notable for the following:    Squamous Epithelial / LPF FEW (*)    Bacteria, UA FEW (*)    All other components within normal limits  CULTURE, BLOOD (ROUTINE X 2)  CULTURE, BLOOD (ROUTINE X 2)  URINE CULTURE  CBG MONITORING, ED  I-STAT CG4 LACTIC ACID, ED    Imaging Review Dg Chest Port 1 View  10/04/2015  CLINICAL DATA:  Altered mental status, fever since yesterday. Nonproductive cough for 3 days. EXAM: PORTABLE CHEST 1 VIEW COMPARISON:  09/04/2015 FINDINGS: There is cardiomegaly with vascular congestion. Low lung volumes. Bibasilar opacities, left greater than right. Cannot exclude pneumonia, particularly in the left base. No visible effusions. No acute bony abnormality. IMPRESSION: Cardiomegaly with vascular congestion. Low lung volumes. Bilateral lower lobe airspace opacities, left greater than right. Cannot exclude pneumonia. Electronically Signed   By: Rolm Baptise M.D.   On: 10/04/2015 17:55   I have personally reviewed and evaluated these images and lab results as part of my medical decision-making.   EKG Interpretation   Date/Time:  Wednesday October 04 2015 17:26:46 EDT Ventricular Rate:  98 PR Interval:  147 QRS Duration: 95 QT Interval:  365 QTC Calculation: 466 R Axis:   87 Text Interpretation:  Sinus tachycardia Multiform ventricular premature  complexes Probable left atrial  enlargement Borderline right axis deviation  Left ventricular hypertrophy Since last tracing Rate faster Confirmed by  MILLER  MD, BRIAN ([id number]) on 10/04/2015 5:34:09 PM  MDM   Final diagnoses:  HCAP (healthcare-associated pneumonia)    CXR significant for bilateral infiltrates concerning for possible pneumonia. Given patient's mental status, history of stroke, paralysis, difficulty eating/swallowing, and use of g-tube, and daughter's account of emesis on bedside, concern for aspiration. White count 14.5, HR90s-100, and fever of 100F puts patient near sepsis criteria. Started on IV Vanc/Zosyn for HCAP due to patient's admission last month. Spoke to hospitalist Dr. Marin Comment, will admit to the floor for HCAP.    Lucrezia Starch, PA-C 10/04/15 1950  Noemi Chapel, MD 10/06/15 [phone]

## 2015-10-04 NOTE — Telephone Encounter (Signed)
Received call from [facility] stating that patient has a wet sounding cough with a fever of 100 (axillary) and an elevated pulse.  Recommended that family take to the ED.

## 2015-10-04 NOTE — ED Notes (Signed)
Assumed care of patient from [facility] - family at bedside, pt with stable VS, continues to be non verbal, opens eyes with painful stimuli - family reports patient normally tracks movement with eyes and moves her left side (unaffected by past stroke) - awaiting bed assignment. No distress noted.

## 2015-10-04 NOTE — ED Notes (Signed)
NP cough x 3 days, fever since yesterday.

## 2015-10-04 NOTE — ED Notes (Signed)
Attempted to call report to unit 300 RN, RN unable to take report at this time.

## 2015-10-05 DIAGNOSIS — E870 Hyperosmolality and hypernatremia: Secondary | ICD-10-CM | POA: Diagnosis present

## 2015-10-05 LAB — CBC
HCT: 33.9 % — ABNORMAL LOW (ref 36.0–46.0)
HEMOGLOBIN: 11.6 g/dL — AB (ref 12.0–15.0)
MCH: 32.7 pg (ref 26.0–34.0)
MCHC: 34.2 g/dL (ref 30.0–36.0)
MCV: 95.5 fL (ref 78.0–100.0)
PLATELETS: 201 10*3/uL (ref 150–400)
RBC: 3.55 MIL/uL — ABNORMAL LOW (ref 3.87–5.11)
RDW: 18.5 % — AB (ref 11.5–15.5)
WBC: 11.7 10*3/uL — ABNORMAL HIGH (ref 4.0–10.5)

## 2015-10-05 LAB — COMPREHENSIVE METABOLIC PANEL
ALBUMIN: 2.6 g/dL — AB (ref 3.5–5.0)
ALK PHOS: 71 U/L (ref 38–126)
ALT: 34 U/L (ref 14–54)
ANION GAP: 5 (ref 5–15)
AST: 26 U/L (ref 15–41)
BILIRUBIN TOTAL: 0.7 mg/dL (ref 0.3–1.2)
BUN: 23 mg/dL — ABNORMAL HIGH (ref 6–20)
CALCIUM: 9.1 mg/dL (ref 8.9–10.3)
CO2: 32 mmol/L (ref 22–32)
Chloride: 109 mmol/L (ref 101–111)
Creatinine, Ser: 0.7 mg/dL (ref 0.44–1.00)
GFR calc Af Amer: >60 mL/min (ref >60)
GFR calc non Af Amer: >60 mL/min (ref >60)
GLUCOSE: 97 mg/dL (ref 65–99)
Potassium: 3.4 mmol/L — ABNORMAL LOW (ref 3.5–5.1)
Sodium: 146 mmol/L — ABNORMAL HIGH (ref 135–145)
TOTAL PROTEIN: 7.5 g/dL (ref 6.5–8.1)

## 2015-10-05 LAB — URINE CULTURE: Culture: NO GROWTH

## 2015-10-05 MED ORDER — RANITIDINE HCL 150 MG/10ML PO SYRP
150.0000 mg | ORAL_SOLUTION | Freq: Every day | ORAL | Status: DC
  Administered 2015-10-05 – 2015-10-08 (×4): 150 mg
  Filled 2015-10-05 (×5): qty 10

## 2015-10-05 MED ORDER — CETYLPYRIDINIUM CHLORIDE 0.05 % MT LIQD
7.0000 mL | Freq: Two times a day (BID) | OROMUCOSAL | Status: DC
  Administered 2015-10-05 – 2015-10-08 (×7): 7 mL via OROMUCOSAL

## 2015-10-05 MED ORDER — LEVALBUTEROL HCL 0.63 MG/3ML IN NEBU
0.6300 mg | INHALATION_SOLUTION | Freq: Four times a day (QID) | RESPIRATORY_TRACT | Status: DC
Start: 2015-10-05 — End: 2015-10-05
  Filled 2015-10-05: qty 3

## 2015-10-05 MED ORDER — FREE WATER
200.0000 mL | Status: DC
  Administered 2015-10-05 – 2015-10-08 (×19): 200 mL

## 2015-10-05 MED ORDER — CHLORHEXIDINE GLUCONATE 0.12 % MT SOLN
15.0000 mL | Freq: Two times a day (BID) | OROMUCOSAL | Status: DC
  Administered 2015-10-05 – 2015-10-08 (×8): 15 mL via OROMUCOSAL
  Filled 2015-10-05 (×8): qty 15

## 2015-10-05 MED ORDER — POTASSIUM CHLORIDE IN NACL 20-0.45 MEQ/L-% IV SOLN
INTRAVENOUS | Status: DC
  Administered 2015-10-05 – 2015-10-06 (×2): via INTRAVENOUS
  Administered 2015-10-07: 60 mL via INTRAVENOUS
  Filled 2015-10-05 (×6): qty 1000

## 2015-10-05 MED ORDER — PREDNISONE 20 MG PO TABS
20.0000 mg | ORAL_TABLET | Freq: Every day | ORAL | Status: DC
  Administered 2015-10-05 – 2015-10-08 (×4): 20 mg
  Filled 2015-10-05 (×4): qty 1

## 2015-10-05 MED ORDER — LACOSAMIDE 50 MG PO TABS
150.0000 mg | ORAL_TABLET | Freq: Two times a day (BID) | ORAL | Status: DC
  Administered 2015-10-05 – 2015-10-08 (×7): 150 mg
  Filled 2015-10-05 (×7): qty 3

## 2015-10-05 MED ORDER — LACOSAMIDE 50 MG PO TABS
150.0000 mg | ORAL_TABLET | Freq: Once | ORAL | Status: AC
  Administered 2015-10-05: 150 mg
  Filled 2015-10-05: qty 3

## 2015-10-05 MED ORDER — INFLUENZA VAC SPLIT QUAD 0.5 ML IM SUSY: 0.5000 mL | PREFILLED_SYRINGE | INTRAMUSCULAR | Status: DC

## 2015-10-05 MED ORDER — POTASSIUM CHLORIDE 20 MEQ PO PACK
20.0000 meq | PACK | Freq: Every day | ORAL | Status: DC
  Administered 2015-10-05 – 2015-10-06 (×2): 20 meq
  Filled 2015-10-05 (×2): qty 1

## 2015-10-05 NOTE — Progress Notes (Signed)
[facility] PROGRESS NOTE  Memori Hiramoto Smithson ZOX:[id number] DOB: [birth year] DOA: 10/04/2015 PCP: [name], MD    Code Status: DO NOT RESUSCITATE Family Communication: Discussed with daughter Disposition Plan: Discharge to home when clinically appropriate   Consultants:  None  Procedures:  None  Antibiotics:  Zosyn 10/12>>  Vancomycin 10/12>>  HPI/Subjective: Patient is mute. Her daughter is in the room. She reports that the patient looks better and is responding a little.  Objective: Filed Vitals:   10/05/15 0845  BP:   Pulse: 100  Temp:   Resp: 18   temperature maximum 100.0. Temperature current 99.9. Heart rate 100. Respiratory rate 18. Blood pressure 147/71. Oxygen saturation 98%.     Intake/Output Summary (Last 24 hours) at 10/05/15 1024 Last data filed at 10/05/15 0851  Gross per 24 hour  Intake 1140.67 ml  Output     13 ml  Net 1127.67 ml   Filed Weights   10/04/15 1719 10/04/15 2101  Weight: 85.73 kg (189 lb) 81.012 kg (178 lb 9.6 oz)    Exam:   General:  chronically ill-appearing 69 year old African-American woman in no acute distress.   Cardiovascular: S1, S2, with borderline tachycardia.   Respiratory: few scattered crackles; breathing nonlabored at rest.   Abdomen: PEG tube in place without surrounding erythema or drainage. Abdomen is obese, positive bowel sounds, soft, nontender, nondistended.   Musculoskeletal/extremities: No pedal edema. No acute hot red joints.   Neurologic: She is mute and semi-alert. Her eyes are closed and she does not open them. She is moving her left arm and left leg spontaneously, but is hemiplegic on the right.    Data Reviewed: Basic Metabolic Panel:  Recent Labs Lab 10/04/15 1755 10/05/15 0547  NA 147* 146*  K 3.4* 3.4*  CL 108 109  CO2 33* 32  GLUCOSE 117* 97  BUN 23* 23*  CREATININE 0.66 0.70  CALCIUM 9.5 9.1   Liver Function Tests:  Recent Labs Lab 10/04/15 1755  10/05/15 0547  AST 23 26  ALT 31 34  ALKPHOS 77 71  BILITOT 0.7 0.7  PROT 8.1 7.5  ALBUMIN 2.8* 2.6*   No results for input(s): LIPASE, AMYLASE in the last 168 hours. No results for input(s): AMMONIA in the last 168 hours. CBC:  Recent Labs Lab 10/04/15 1755 10/05/15 0547  WBC 14.1* 11.7*  NEUTROABS 10.7*  --   HGB 13.0 11.6*  HCT 37.7 33.9*  MCV 95.7 95.5  PLT 210 201   Cardiac Enzymes: No results for input(s): CKTOTAL, CKMB, CKMBINDEX, TROPONINI in the last 168 hours. BNP (last 3 results) No results for input(s): BNP in the last 8760 hours.  ProBNP (last 3 results) No results for input(s): PROBNP in the last 8760 hours.  CBG:  Recent Labs Lab 10/04/15 1726  GLUCAP 93    Recent Results (from the past 240 hour(s))  Urine culture     Status: None (Preliminary result)   Collection Time: 10/04/15  6:00 PM  Result Value Ref Range Status   Specimen Description URINE, CATHETERIZED  Final   Special Requests NONE  Final   Culture   Final    NO GROWTH < 12 HOURS Performed at [facility]    Report Status PENDING  Incomplete     Studies: Dg Chest Port 1 View  10/04/2015  CLINICAL DATA:  Altered mental status, fever since yesterday. Nonproductive cough for 3 days. EXAM: PORTABLE CHEST 1 VIEW COMPARISON:  09/04/2015 FINDINGS: There is cardiomegaly with vascular congestion.  Low lung volumes. Bibasilar opacities, left greater than right. Cannot exclude pneumonia, particularly in the left base. No visible effusions. No acute bony abnormality. IMPRESSION: Cardiomegaly with vascular congestion. Low lung volumes. Bilateral lower lobe airspace opacities, left greater than right. Cannot exclude pneumonia. Electronically Signed   By: Charlett Nose M.D.   On: 10/04/2015 17:55    Scheduled Meds: . sodium chloride   Intravenous STAT  . amLODipine  10 mg Per Tube Daily  . antiseptic oral rinse  7 mL Mouth Rinse q12n4p  . carvedilol  6.25 mg Per Tube BID WC  .  chlorhexidine  15 mL Mouth Rinse BID  . clopidogrel  75 mg Per Tube Daily  . enoxaparin (LOVENOX) injection  40 mg Subcutaneous Q24H  . feeding supplement (GLUCERNA 1.2 CAL)  237 mL Per Tube 5 X Daily  . free water  100 mL Per Tube 6 times per day  . [START ON 10/06/2015] Influenza vac split quadrivalent PF  0.5 mL Intramuscular Tomorrow-1000  . lacosamide  150 mg Per Tube BID  . levETIRAcetam  1,000 mg Per Tube BID  . [START ON 10/07/2015] levothyroxine  37.5 mcg Oral Once per day on Sun Sat   And  . levothyroxine  75 mcg Oral Once per day on Mon Tue Wed Thu Fri  . piperacillin-tazobactam (ZOSYN)  IV  3.375 g Intravenous Q8H  . pravastatin  40 mg Oral q1800  . sodium chloride  3 mL Intravenous Q12H  . vancomycin  1,000 mg Intravenous Q12H  . [START ON 10/06/2015] Vitamin D (Ergocalciferol)  50,000 Units Per Tube Weekly   Continuous Infusions:   Assessment and plan:  Principal Problem:   Aspiration pneumonia (HCC) Active Problems:   HCAP (healthcare-associated pneumonia)   Hypernatremia   Hypothyroid   Multiple myeloma without remission (HCC)   CKD (chronic kidney disease) stage 3, GFR 30-59 ml/min   Chronic ischemic left MCA stroke   Hemiplegia affecting right dominant side (HCC)   Partial seizure (HCC)   Essential hypertension   PEG (percutaneous endoscopic gastrostomy) status (HCC)    1. Aspiration pneumonia and healthcare associated pneumonia. On admission, the patient's chest x-ray revealed bibasilar lower lobe airspace opacities left greater than right. She was febrile with a temperature of 100.0 and her white blood cell count was 14.1.  -In September 2016, she was treated for aspiration pneumonia. Subsequently, CT of the chest in September revealed pulmonary fibrosis with no airspace process but there was aspirate material over the right lower bronchus. -Reportedly, the patient had some vomiting a few days ago, so aspiration pneumonia is likely. -Blood cultures were  ordered in the ED and are pending. She was started on vancomycin and Zosyn.  Pulmonary fibrosis. The patient does have a few pulmonary crackles on exam which could be secondary to pneumonia and fibrosis. -We will add low dose prednisone and Xopenex nebulizer.  Hypernatremia. Patient's serum sodium was 147 on admission. This could be secondary to mild dehydration and PEG feedings. -We will increase free water to 200 cc (up from 100 cc) every 4 hours. -We'll change IV fluids to half-normal saline.  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 She is a DO NOT RESUSCITATE. She receives bolus tube feedings at home. -She has been continued on tube feedings, Plavix, and pravastatin.  Essential hypertension. Currently stable. She was continued on amlodipine and carvedilol.  Hypothyroidism. Synthroid has been continued. Her TSH was within normal  limits at 0.6.   Seizure disorder. Currently stable. Keppra and Vimpat have been continued.  Mild hypokalemia. We'll add serum potassium to the gentle IV fluids and small dosing per PEG tube.  Recent vomiting. We'll add H2 blocker empirically. Nursing will be checking for residuals.    Time spent: 35 minutes     Mitsy Owen  [facility] Pager [phone]  If 7PM-7AM, please contact night-coverage at [email or web address], password [facility] 10/05/2015, 10:24 AM  LOS: 1 day

## 2015-10-05 NOTE — Care Management Note (Signed)
Case Management Note  Patient Details  Name: VONCEIL UPSHUR MRN: [id number] Date of Birth: [birth year]  Subjective/Objective:                  Pt admitted from home with aspiration pneumonia. Pt lives with her daughters who provide 24 hour care for the pt. Pt is active with [facility]. [facility] RN, PT, and OT had just discharged the pt. Pt has hospital bed, hoyer lift for home use. Pt also receives all PEG feeding supplies from [facility].  Action/Plan: Will arrange resumption of [facility] RN and ST at discharge. Pt may benefit from Palliative care consult. Will also need home O2 assessment prior to discharge.  Expected Discharge Date:                  Expected Discharge Plan:  [place]  In-House Referral:  NA  Discharge planning Services  CM Consult  Post Acute Care Choice:  Resumption of Svcs/PTA Provider Choice offered to:  Adult Children  DME Arranged:    DME Agency:     HH Arranged:  RN, Speech Therapy HH Agency:  [place]  Status of Service:  In process, will continue to follow  Medicare Important Message Given:    Date Medicare IM Given:    Medicare IM give by:    Date Additional Medicare IM Given:    Additional Medicare Important Message give by:     If discussed at [place] of Stay Meetings, dates discussed:    Additional Comments:  Joylene Draft, RN 10/05/2015, 1:07 PM

## 2015-10-05 NOTE — Progress Notes (Addendum)
Initial Nutrition Assessment  DOCUMENTATION CODES:  Not applicable  INTERVENTION:  If longer admission expected, Recommend Changing to continuous TF. Jevity 1.5 @ 50 ml/hr via PEG. No titration necessary as she is chronically Tube fed.   Tube feeding regimen provides 1800 kcal (100% of needs), 77 grams of protein, and 912 ml of H2O.   NUTRITION DIAGNOSIS:  Inadequate oral intake related to inability to eat as evidenced by NPO status.  GOAL:  Patient will meet greater than or equal to 90% of their needs  MONITOR:  Labs, Weight trends, TF tolerance, I & O's  REASON FOR ASSESSMENT:  Home tube feeder  ASSESSMENT:  69 y.o. female PMHx CVA, resulting in aphasia, bedbound, right hemiplegia and left hemiparesis, dysphagia requiring PEG and TF, and strict NPO, hypothyroidism, HLD, HTN, recent seizure. Presents with Fever, N/V. Work up shows bilateral infiltrates suspect for aspiration PNA  Home TF regimen: 5 cans JEVITY1.5 daily. Flush 100 ml water q 4 hours Provides: 1778 kcals, 75.5 g Pro, 900 ml fluid + 600 ml flushes  Spoke with family member states pt was receiving a can approximately every 2 hours.   Besides a couple isolated incidents there has been no n/v/c/d. Pt appears to be tolerating.   She reports shortly after pt's last admission and rehab stint, her TF formula was changed from Osmolite to Jevity 1.5. Unclear exactly why the formula was changed. Family member states that pt was receiving prostat when she was using the osmolite and was advised that a switch to  jevity would make it so the pt would not need the prostat. The protein difference between Jevity and osmolite is .2 gram and is unlikely the sole reason the formula was switched.   Family member questioned the current wt measurement. Bed wt taken. It showed 190 lbs. She is unsure how the wt of 178 lbs was recorded. She states there were a series of mis-weights while the pt was in rehab and one of either may have been  reported by another family member or carried over from documentation. Family member DOES believe pt looks thinner.   Per pt, MD believes pt was dehydrated and needs increase in flush amount. Family curious if pt needs more tube feeding if she is losing weight.   Pt's TF regimen is estimated to continue to provide enough calories/protein. Do not believe it needs adjusted at this time. However, she does likely need 100-200 more fluid from flushes as her current regimen does not meet estimated needs.   Diet Order:  Diet NPO time specified  Skin:  Reviewed, no issues  Last BM:  10/13  Height:  Ht Readings from Last 1 Encounters:  10/04/15 5\' 6"  (1.676 m)   Weight:  Wt Readings from Last 1 Encounters:  10/04/15 178 lb 9.6 oz (81.012 kg)   Wt Readings from Last 10 Encounters:  10/04/15 178 lb 9.6 oz (81.012 kg)  09/04/15 189 lb (85.73 kg)  08/03/15 192 lb 1.6 oz (87.136 kg)  04/25/15 240 lb (108.863 kg)  04/13/15 240 lb (108.863 kg)  11/06/14 240 lb (108.863 kg)  10/01/14 240 lb 15.4 oz (109.3 kg)  09/15/14 245 lb 3.2 oz (111.222 kg)  09/12/14 246 lb 6.4 oz (111.766 kg)  09/08/14 247 lb (112.038 kg)   Ideal Body Weight:  59.1 kg  BMI:  Body mass index is 28.84 kg/(m^2).  Estimated Nutritional Needs:  Kcal:  1600-1850 (20-23 kcal/kg) Protein:  65-77 g (1.1-1.3 g/kg IBW) Fluid:  1.6-1.9 liters  EDUCATION NEEDS:  No education needs identified at this time  Burtis Junes RD, LDN Nutrition Pager: [phone] 10/05/2015 7:03 PM

## 2015-10-05 NOTE — Plan of Care (Signed)
Problem: Consults Goal: General Medical Patient Education See Patient Education Module for specific education.  Outcome: Completed/Met Date Met:  10/05/15 Family at bedside

## 2015-10-05 NOTE — Discharge Instructions (Signed)
Recommend 5 cans JEVITY1.5 daily. Flush 80 ml before and after each feed Provides: 1778 kcals, 75.5 g Pro, 900 ml fluid + 800 ml flushes

## 2015-10-06 LAB — BASIC METABOLIC PANEL
Anion gap: 7 (ref 5–15)
BUN: 22 mg/dL — ABNORMAL HIGH (ref 6–20)
CALCIUM: 9.2 mg/dL (ref 8.9–10.3)
CO2: 30 mmol/L (ref 22–32)
CREATININE: 0.67 mg/dL (ref 0.44–1.00)
Chloride: 107 mmol/L (ref 101–111)
GFR calc Af Amer: >60 mL/min (ref >60)
GLUCOSE: 137 mg/dL — AB (ref 65–99)
POTASSIUM: 3.3 mmol/L — AB (ref 3.5–5.1)
SODIUM: 144 mmol/L (ref 135–145)

## 2015-10-06 LAB — CBC
HCT: 32.2 % — ABNORMAL LOW (ref 36.0–46.0)
Hemoglobin: 11.1 g/dL — ABNORMAL LOW (ref 12.0–15.0)
MCH: 32.7 pg (ref 26.0–34.0)
MCHC: 34.5 g/dL (ref 30.0–36.0)
MCV: 95 fL (ref 78.0–100.0)
PLATELETS: 184 10*3/uL (ref 150–400)
RBC: 3.39 MIL/uL — AB (ref 3.87–5.11)
RDW: 18.1 % — AB (ref 11.5–15.5)
WBC: 8.8 10*3/uL (ref 4.0–10.5)

## 2015-10-06 MED ORDER — POTASSIUM CHLORIDE 20 MEQ PO PACK
20.0000 meq | PACK | Freq: Two times a day (BID) | ORAL | Status: DC
  Administered 2015-10-06 – 2015-10-08 (×4): 20 meq
  Filled 2015-10-06 (×4): qty 1

## 2015-10-06 NOTE — Progress Notes (Signed)
[facility] PROGRESS NOTE  Melissa [name] LKG:[id number] DOB: 1946/12/11 DOA: 10/04/2015 PCP: [name], MD    Code Status: DO NOT RESUSCITATE Family Communication: Discussed with daughter Disposition Plan: Discharge to home when clinically appropriate; likely in 48-72 hours.   Consultants:  None  Procedures:  None  Antibiotics:  Zosyn 10/12>>  Vancomycin 10/12>>  HPI/Subjective: Patient is mute, but she is clearly more alert with her eyes open and tracking.  Objective: Filed Vitals:   10/06/15 1457  BP: 169/80  Pulse: 82  Temp: 98.7 F (37.1 C)  Resp: 20   temperature 98.7.    Intake/Output Summary (Last 24 hours) at 10/06/15 1707 Last data filed at 10/05/15 1800  Gross per 24 hour  Intake      0 ml  Output      0 ml  Net      0 ml   Filed Weights   10/04/15 1719 10/04/15 2101  Weight: 85.73 kg (189 lb) 81.012 kg (178 lb 9.6 oz)    Exam:   General:  chronically ill-appearing 69 year old African-American woman in no acute distress.   Cardiovascular: S1, S2, with a soft systolic murmur.   Respiratory: few scattered crackles; breathing nonlabored at rest.   Abdomen: PEG tube in place without surrounding erythema or drainage. Abdomen is obese, positive bowel sounds, soft, nontender, nondistended.   Musculoskeletal/extremities: No pedal edema. No acute hot red joints.   Neurologic: She is mute and clearly more alert. Her eyes are open, she is moving her left arm and left leg spontaneously. She does give contact sporadically when her name is called. She is hemiplegic on the right.    Data Reviewed: Basic Metabolic Panel:  Recent Labs Lab 10/04/15 1755 10/05/15 0547 10/06/15 0628  NA 147* 146* 144  K 3.4* 3.4* 3.3*  CL 108 109 107  CO2 33* 32 30  GLUCOSE 117* 97 137*  BUN 23* 23* 22*  CREATININE 0.66 0.70 0.67  CALCIUM 9.5 9.1 9.2   Liver Function Tests:  Recent Labs Lab 10/04/15 1755 10/05/15 0547  AST 23 26  ALT  31 34  ALKPHOS 77 71  BILITOT 0.7 0.7  PROT 8.1 7.5  ALBUMIN 2.8* 2.6*   No results for input(s): LIPASE, AMYLASE in the last 168 hours. No results for input(s): AMMONIA in the last 168 hours. CBC:  Recent Labs Lab 10/04/15 1755 10/05/15 0547 10/06/15 0628  WBC 14.1* 11.7* 8.8  NEUTROABS 10.7*  --   --   HGB 13.0 11.6* 11.1*  HCT 37.7 33.9* 32.2*  MCV 95.7 95.5 95.0  PLT 210 201 184   Cardiac Enzymes: No results for input(s): CKTOTAL, CKMB, CKMBINDEX, TROPONINI in the last 168 hours. BNP (last 3 results) No results for input(s): BNP in the last 8760 hours.  ProBNP (last 3 results) No results for input(s): PROBNP in the last 8760 hours.  CBG:  Recent Labs Lab 10/04/15 1726  GLUCAP 93    Recent Results (from the past 240 hour(s))  Blood Culture (routine x 2)     Status: None (Preliminary result)   Collection Time: 10/04/15  5:55 PM  Result Value Ref Range Status   Specimen Description BLOOD LEFT HAND  Final   Special Requests BOTTLES DRAWN AEROBIC AND ANAEROBIC 4CC EACH  Final   Culture NO GROWTH 2 DAYS  Final   Report Status PENDING  Incomplete  Blood Culture (routine x 2)     Status: None (Preliminary result)   Collection Time: 10/04/15  6:00 PM  Result Value Ref Range Status   Specimen Description BLOOD LEFT ARM  Final   Special Requests BOTTLES DRAWN AEROBIC AND ANAEROBIC 5CC EACH  Final   Culture NO GROWTH 2 DAYS  Final   Report Status PENDING  Incomplete  Urine culture     Status: None   Collection Time: 10/04/15  6:00 PM  Result Value Ref Range Status   Specimen Description URINE, CATHETERIZED  Final   Special Requests NONE  Final   Culture   Final    NO GROWTH 1 DAY Performed at [facility]    Report Status 10/05/2015 FINAL  Final     Studies: Dg Chest Port 1 View  10/04/2015  CLINICAL DATA:  Altered mental status, fever since yesterday. Nonproductive cough for 3 days. EXAM: PORTABLE CHEST 1 VIEW COMPARISON:  09/04/2015 FINDINGS: There  is cardiomegaly with vascular congestion. Low lung volumes. Bibasilar opacities, left greater than right. Cannot exclude pneumonia, particularly in the left base. No visible effusions. No acute bony abnormality. IMPRESSION: Cardiomegaly with vascular congestion. Low lung volumes. Bilateral lower lobe airspace opacities, left greater than right. Cannot exclude pneumonia. Electronically Signed   By: Charlett Nose M.D.   On: 10/04/2015 17:55    Scheduled Meds: . amLODipine  10 mg Per Tube Daily  . antiseptic oral rinse  7 mL Mouth Rinse q12n4p  . carvedilol  6.25 mg Per Tube BID WC  . chlorhexidine  15 mL Mouth Rinse BID  . clopidogrel  75 mg Per Tube Daily  . enoxaparin (LOVENOX) injection  40 mg Subcutaneous Q24H  . feeding supplement (GLUCERNA 1.2 CAL)  237 mL Per Tube 5 X Daily  . free water  200 mL Per Tube 6 times per day  . lacosamide  150 mg Per Tube BID  . levETIRAcetam  1,000 mg Per Tube BID  . [START ON 10/07/2015] levothyroxine  37.5 mcg Oral Once per day on Sun Sat   And  . levothyroxine  75 mcg Oral Once per day on Mon Tue Wed Thu Fri  . piperacillin-tazobactam (ZOSYN)  IV  3.375 g Intravenous Q8H  . potassium chloride  20 mEq Per Tube Daily  . pravastatin  40 mg Oral q1800  . predniSONE  20 mg Per Tube Q breakfast  . ranitidine  150 mg Per Tube Daily  . sodium chloride  3 mL Intravenous Q12H  . vancomycin  1,000 mg Intravenous Q12H  . Vitamin D (Ergocalciferol)  50,000 Units Per Tube Weekly   Continuous Infusions: . 0.45 % NaCl with KCl 20 mEq / L 60 mL/hr at 10/06/15 1417    Assessment and plan:  Principal Problem:   Aspiration pneumonia (HCC) Active Problems:   HCAP (healthcare-associated pneumonia)   Hypernatremia   Hypothyroid   Pulmonary fibrosis (HCC)   Multiple myeloma without remission (HCC)   CKD (chronic kidney disease) stage 3, GFR 30-59 ml/min   Chronic ischemic left MCA stroke   Hemiplegia affecting right dominant side (HCC)   Partial seizure (HCC)    Essential hypertension   PEG (percutaneous endoscopic gastrostomy) status (HCC)    1. Aspiration pneumonia and healthcare associated pneumonia. On admission, the patient's chest x-ray revealed bibasilar lower lobe airspace opacities left greater than right. She was febrile with a temperature of 100.0 and her white blood cell count was 14.1.  -In September 2016, she was treated for aspiration pneumonia. Subsequently, CT of the chest in September revealed pulmonary fibrosis with no airspace process but  there was aspirate material over the right lower bronchus. -Reportedly, the patient had some vomiting a few days ago, so aspiration pneumonia is likely. -Blood cultures were ordered in the ED and are negative to date.  -She was started on vancomycin and Zosyn; will continue for another 24- 48 hours then consider narrowing therapy. -She is afebrile now and her white blood cell count has trended down.  Pulmonary fibrosis. The patient does have a few pulmonary crackles on exam which could be secondary to pneumonia and fibrosis. - Low dose prednisone and Xopenex nebulizer were added.  Hypernatremia. Patient's serum sodium was 147 on admission. This could be secondary to mild dehydration and PEG feedings. -Free water via the PEG tube was increased to 200 cc (up from 100 cc) every 4 hours and gentle IV fluids changed to half-normal saline. Her serum sodium has improved. Continue to monitor.  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 She is a DO NOT RESUSCITATE. She receives bolus tube feedings at home. -She has been continued on tube feedings, Plavix, and pravastatin.  Essential hypertension. Her blood pressure is trending up. She was continued on amlodipine and carvedilol. We'll continue to monitor before considering increasing the doses.  Hypothyroidism. Synthroid has been continued. Her TSH was  within normal limits at 0.6.   Seizure disorder. Currently stable. Keppra and Vimpat have been continued.  Mild hypokalemia. We'll add serum potassium to the gentle IV fluids and small dosing per PEG tube.  Recent vomiting. H2 blocker was added. Continue when necessary Zofran. Nursing will be checking for residuals.    Time spent: 30 minutes     Jadence Kinlaw  [facility] Pager [phone]  If 7PM-7AM, please contact night-coverage at [email or web address], password [facility] 10/06/2015, 5:07 PM  LOS: 2 days

## 2015-10-06 NOTE — Care Management Note (Addendum)
Case Management Note  Patient Details  Name: Melissa [name] MRN: [id number] Date of Birth: [birth year]  Expected Discharge Date:    10/08/2015              Expected Discharge Plan:  [place]  In-House Referral:  NA  Discharge planning Services  CM Consult  Post Acute Care Choice:  Resumption of Svcs/PTA Provider Choice offered to:  Adult Children  DME Arranged:    DME Agency:     HH Arranged:  RN, Speech Therapy HH Agency:  [facility]  Status of Service:  In process, will continue to follow  Medicare Important Message Given:  Yes-second notification given Date Medicare IM Given:    Medicare IM give by:    Date Additional Medicare IM Given:    Additional Medicare Important Message give by:     If discussed at [place] of Stay Meetings, dates discussed:    Additional Comments: If pt discharges over weekend, RN will notify [facility] of DC. Pt's daughter aware [facility] has 48 hours to resume services. Pt's daughter confirms they have to DME needs at home. No further CM needs noted.  Sherald Barge, RN 10/06/2015, 10:29 AM

## 2015-10-06 NOTE — Care Management Important Message (Signed)
Important Message  Patient Details  Name: Melissa [name] MRN: [id number] Date of Birth: 1946/10/17   Medicare Important Message Given:  Yes-second notification given    Sherald Barge, RN 10/06/2015, 10:27 AM

## 2015-10-07 DIAGNOSIS — I10 Essential (primary) hypertension: Secondary | ICD-10-CM

## 2015-10-07 DIAGNOSIS — Z931 Gastrostomy status: Secondary | ICD-10-CM

## 2015-10-07 DIAGNOSIS — J690 Pneumonitis due to inhalation of food and vomit: Principal | ICD-10-CM

## 2015-10-07 DIAGNOSIS — Z8673 Personal history of transient ischemic attack (TIA), and cerebral infarction without residual deficits: Secondary | ICD-10-CM

## 2015-10-07 DIAGNOSIS — E870 Hyperosmolality and hypernatremia: Secondary | ICD-10-CM

## 2015-10-07 DIAGNOSIS — G8191 Hemiplegia, unspecified affecting right dominant side: Secondary | ICD-10-CM

## 2015-10-07 DIAGNOSIS — N183 Chronic kidney disease, stage 3 (moderate): Secondary | ICD-10-CM

## 2015-10-07 LAB — CBC
HCT: 32.7 % — ABNORMAL LOW (ref 36.0–46.0)
HEMOGLOBIN: 11.2 g/dL — AB (ref 12.0–15.0)
MCH: 32.1 pg (ref 26.0–34.0)
MCHC: 34.3 g/dL (ref 30.0–36.0)
MCV: 93.7 fL (ref 78.0–100.0)
PLATELETS: 197 10*3/uL (ref 150–400)
RBC: 3.49 MIL/uL — AB (ref 3.87–5.11)
RDW: 17.5 % — ABNORMAL HIGH (ref 11.5–15.5)
WBC: 8 10*3/uL (ref 4.0–10.5)

## 2015-10-07 LAB — BASIC METABOLIC PANEL
ANION GAP: 4 — AB (ref 5–15)
BUN: 18 mg/dL (ref 6–20)
CO2: 30 mmol/L (ref 22–32)
Calcium: 9.3 mg/dL (ref 8.9–10.3)
Chloride: 106 mmol/L (ref 101–111)
Creatinine, Ser: 0.63 mg/dL (ref 0.44–1.00)
GLUCOSE: 93 mg/dL (ref 65–99)
POTASSIUM: 4 mmol/L (ref 3.5–5.1)
Sodium: 140 mmol/L (ref 135–145)

## 2015-10-07 MED ORDER — CARVEDILOL 3.125 MG PO TABS
6.2500 mg | ORAL_TABLET | Freq: Once | ORAL | Status: AC
  Administered 2015-10-07: 6.25 mg
  Filled 2015-10-07: qty 2

## 2015-10-07 MED ORDER — CARVEDILOL 12.5 MG PO TABS
12.5000 mg | ORAL_TABLET | Freq: Two times a day (BID) | ORAL | Status: DC
Start: 2015-10-08 — End: 2015-10-08
  Administered 2015-10-08: 12.5 mg
  Filled 2015-10-07: qty 1

## 2015-10-07 NOTE — Progress Notes (Signed)
[facility] PROGRESS NOTE  Melissa [name] WUJ:[id number] DOB: 30-Mar-1946 DOA: 10/04/2015 PCP: [name], MD    Code Status: DO NOT RESUSCITATE Family Communication: Discussed with daughter Disposition Plan: Discharge to home when clinically appropriate; likely in 48-72 hours.   Consultants:  None  Procedures:  None  Antibiotics:  Zosyn 10/12>>  Vancomycin 10/12>>  HPI/Subjective: Patient responds to voice but does not follow commands  Objective: Filed Vitals:   10/07/15 1505  BP: 179/85  Pulse: 83  Temp: 97.6 F (36.4 C)  Resp: 17       Intake/Output Summary (Last 24 hours) at 10/07/15 1814 Last data filed at 10/07/15 1715  Gross per 24 hour  Intake    650 ml  Output      0 ml  Net    650 ml   Filed Weights   10/04/15 1719 10/04/15 2101  Weight: 85.73 kg (189 lb) 81.012 kg (178 lb 9.6 oz)    Exam:   General:  chronically ill-appearing 69 year old African-American woman in no acute distress.   Cardiovascular: S1, S2, with a soft systolic murmur.   Respiratory: CTA B; breathing nonlabored at rest.   Abdomen: PEG tube in place without surrounding erythema or drainage. Abdomen is obese, positive bowel sounds, soft, nontender, nondistended.   Musculoskeletal/extremities: No pedal edema. No acute hot red joints.   Neurologic: She is mute and clearly more alert. Her eyes are open, She does give contact sporadically when her name is called. She is hemiplegic on the right.    Data Reviewed: Basic Metabolic Panel:  Recent Labs Lab 10/04/15 1755 10/05/15 0547 10/06/15 0628 10/07/15 0624  NA 147* 146* 144 140  K 3.4* 3.4* 3.3* 4.0  CL 108 109 107 106  CO2 33* 32 30 30  GLUCOSE 117* 97 137* 93  BUN 23* 23* 22* 18  CREATININE 0.66 0.70 0.67 0.63  CALCIUM 9.5 9.1 9.2 9.3   Liver Function Tests:  Recent Labs Lab 10/04/15 1755 10/05/15 0547  AST 23 26  ALT 31 34  ALKPHOS 77 71  BILITOT 0.7 0.7  PROT 8.1 7.5  ALBUMIN 2.8*  2.6*   No results for input(s): LIPASE, AMYLASE in the last 168 hours. No results for input(s): AMMONIA in the last 168 hours. CBC:  Recent Labs Lab 10/04/15 1755 10/05/15 0547 10/06/15 0628 10/07/15 0624  WBC 14.1* 11.7* 8.8 8.0  NEUTROABS 10.7*  --   --   --   HGB 13.0 11.6* 11.1* 11.2*  HCT 37.7 33.9* 32.2* 32.7*  MCV 95.7 95.5 95.0 93.7  PLT 210 201 184 197   Cardiac Enzymes: No results for input(s): CKTOTAL, CKMB, CKMBINDEX, TROPONINI in the last 168 hours. BNP (last 3 results) No results for input(s): BNP in the last 8760 hours.  ProBNP (last 3 results) No results for input(s): PROBNP in the last 8760 hours.  CBG:  Recent Labs Lab 10/04/15 1726  GLUCAP 93    Recent Results (from the past 240 hour(s))  Blood Culture (routine x 2)     Status: None (Preliminary result)   Collection Time: 10/04/15  5:55 PM  Result Value Ref Range Status   Specimen Description BLOOD LEFT HAND  Final   Special Requests BOTTLES DRAWN AEROBIC AND ANAEROBIC 4CC EACH  Final   Culture NO GROWTH 3 DAYS  Final   Report Status PENDING  Incomplete  Blood Culture (routine x 2)     Status: None (Preliminary result)   Collection Time: 10/04/15  6:00 PM  [facility] PROGRESS NOTE  Melissa [name] SNK:[id number] DOB: October 11, 1946 DOA: 10/04/2015 PCP: Tula Nakayama, MD    Code Status: DO NOT RESUSCITATE Family Communication: Discussed with daughter Disposition Plan: Discharge to home when clinically appropriate; likely in 48-72 hours.   Consultants:  None  Procedures:  None  Antibiotics:  Zosyn 10/12>>  Vancomycin 10/12>>  HPI/Subjective: Patient responds to voice but does not follow commands  Objective: Filed Vitals:   10/07/15 1505  BP: 179/85  Pulse: 83  Temp: 97.6 F (36.4 C)  Resp: 17       Intake/Output Summary (Last 24 hours) at 10/07/15 1814 Last data filed at 10/07/15 1715  Gross per 24 hour  Intake    650 ml  Output      0 ml  Net    650 ml   Filed Weights   10/04/15 1719 10/04/15 2101  Weight: 85.73 kg (189 lb) 81.012 kg (178 lb 9.6 oz)    Exam:   General:  chronically ill-appearing 69 year old African-American woman in no acute distress.   Cardiovascular: S1, S2, with a soft systolic murmur.   Respiratory: CTA B; breathing nonlabored at rest.   Abdomen: PEG tube in place without surrounding erythema or drainage. Abdomen is obese, positive bowel sounds, soft, nontender, nondistended.   Musculoskeletal/extremities: No pedal edema. No acute hot red joints.   Neurologic: She is mute and clearly more alert. Her eyes are open, She does give contact sporadically when her name is called. She is hemiplegic on the right.    Data Reviewed: Basic Metabolic Panel:  Recent Labs Lab 10/04/15 1755 10/05/15 0547 10/06/15 0628 10/07/15 0624  NA 147* 146* 144 140  K 3.4* 3.4* 3.3* 4.0  CL 108 109 107 106  CO2 33* 32 30 30  GLUCOSE 117* 97 137* 93  BUN 23* 23* 22* 18  CREATININE 0.66 0.70 0.67 0.63  CALCIUM 9.5 9.1 9.2 9.3   Liver Function Tests:  Recent Labs Lab 10/04/15 1755 10/05/15 0547  AST 23 26  ALT 31 34  ALKPHOS 77 71  BILITOT 0.7 0.7  PROT 8.1 7.5  ALBUMIN 2.8*  2.6*   No results for input(s): LIPASE, AMYLASE in the last 168 hours. No results for input(s): AMMONIA in the last 168 hours. CBC:  Recent Labs Lab 10/04/15 1755 10/05/15 0547 10/06/15 0628 10/07/15 0624  WBC 14.1* 11.7* 8.8 8.0  NEUTROABS 10.7*  --   --   --   HGB 13.0 11.6* 11.1* 11.2*  HCT 37.7 33.9* 32.2* 32.7*  MCV 95.7 95.5 95.0 93.7  PLT 210 201 184 197   Cardiac Enzymes: No results for input(s): CKTOTAL, CKMB, CKMBINDEX, TROPONINI in the last 168 hours. BNP (last 3 results) No results for input(s): BNP in the last 8760 hours.  ProBNP (last 3 results) No results for input(s): PROBNP in the last 8760 hours.  CBG:  Recent Labs Lab 10/04/15 1726  GLUCAP 93    Recent Results (from the past 240 hour(s))  Blood Culture (routine x 2)     Status: None (Preliminary result)   Collection Time: 10/04/15  5:55 PM  Result Value Ref Range Status   Specimen Description BLOOD LEFT HAND  Final   Special Requests BOTTLES DRAWN AEROBIC AND ANAEROBIC 4CC EACH  Final   Culture NO GROWTH 3 DAYS  Final   Report Status PENDING  Incomplete  Blood Culture (routine x 2)     Status: None (Preliminary result)   Collection Time: 10/04/15  6:00 PM  [facility] PROGRESS NOTE  Melissa [name] SNK:[id number] DOB: October 11, 1946 DOA: 10/04/2015 PCP: Tula Nakayama, MD    Code Status: DO NOT RESUSCITATE Family Communication: Discussed with daughter Disposition Plan: Discharge to home when clinically appropriate; likely in 48-72 hours.   Consultants:  None  Procedures:  None  Antibiotics:  Zosyn 10/12>>  Vancomycin 10/12>>  HPI/Subjective: Patient responds to voice but does not follow commands  Objective: Filed Vitals:   10/07/15 1505  BP: 179/85  Pulse: 83  Temp: 97.6 F (36.4 C)  Resp: 17       Intake/Output Summary (Last 24 hours) at 10/07/15 1814 Last data filed at 10/07/15 1715  Gross per 24 hour  Intake    650 ml  Output      0 ml  Net    650 ml   Filed Weights   10/04/15 1719 10/04/15 2101  Weight: 85.73 kg (189 lb) 81.012 kg (178 lb 9.6 oz)    Exam:   General:  chronically ill-appearing 69 year old African-American woman in no acute distress.   Cardiovascular: S1, S2, with a soft systolic murmur.   Respiratory: CTA B; breathing nonlabored at rest.   Abdomen: PEG tube in place without surrounding erythema or drainage. Abdomen is obese, positive bowel sounds, soft, nontender, nondistended.   Musculoskeletal/extremities: No pedal edema. No acute hot red joints.   Neurologic: She is mute and clearly more alert. Her eyes are open, She does give contact sporadically when her name is called. She is hemiplegic on the right.    Data Reviewed: Basic Metabolic Panel:  Recent Labs Lab 10/04/15 1755 10/05/15 0547 10/06/15 0628 10/07/15 0624  NA 147* 146* 144 140  K 3.4* 3.4* 3.3* 4.0  CL 108 109 107 106  CO2 33* 32 30 30  GLUCOSE 117* 97 137* 93  BUN 23* 23* 22* 18  CREATININE 0.66 0.70 0.67 0.63  CALCIUM 9.5 9.1 9.2 9.3   Liver Function Tests:  Recent Labs Lab 10/04/15 1755 10/05/15 0547  AST 23 26  ALT 31 34  ALKPHOS 77 71  BILITOT 0.7 0.7  PROT 8.1 7.5  ALBUMIN 2.8*  2.6*   No results for input(s): LIPASE, AMYLASE in the last 168 hours. No results for input(s): AMMONIA in the last 168 hours. CBC:  Recent Labs Lab 10/04/15 1755 10/05/15 0547 10/06/15 0628 10/07/15 0624  WBC 14.1* 11.7* 8.8 8.0  NEUTROABS 10.7*  --   --   --   HGB 13.0 11.6* 11.1* 11.2*  HCT 37.7 33.9* 32.2* 32.7*  MCV 95.7 95.5 95.0 93.7  PLT 210 201 184 197   Cardiac Enzymes: No results for input(s): CKTOTAL, CKMB, CKMBINDEX, TROPONINI in the last 168 hours. BNP (last 3 results) No results for input(s): BNP in the last 8760 hours.  ProBNP (last 3 results) No results for input(s): PROBNP in the last 8760 hours.  CBG:  Recent Labs Lab 10/04/15 1726  GLUCAP 93    Recent Results (from the past 240 hour(s))  Blood Culture (routine x 2)     Status: None (Preliminary result)   Collection Time: 10/04/15  5:55 PM  Result Value Ref Range Status   Specimen Description BLOOD LEFT HAND  Final   Special Requests BOTTLES DRAWN AEROBIC AND ANAEROBIC 4CC EACH  Final   Culture NO GROWTH 3 DAYS  Final   Report Status PENDING  Incomplete  Blood Culture (routine x 2)     Status: None (Preliminary result)   Collection Time: 10/04/15  6:00 PM

## 2015-10-07 NOTE — Progress Notes (Signed)
ANTIBIOTIC CONSULT NOTE - FOLLOW UP  Pharmacy Consult for Vancomycin and Zosyn  Indication: pneumonia  Allergies  Allergen Reactions  . Morphine Nausea And Vomiting    Patient Measurements: Height: 5\' 6"  (167.6 cm) Weight: 178 lb 9.6 oz (81.012 kg) IBW/kg (Calculated) : 59.3  Vital Signs: Temp: 98.5 F (36.9 C) (10/15 0649) Temp Source: Oral (10/15 0649) BP: 167/71 mmHg (10/15 0649) Pulse Rate: 76 (10/15 0649) Intake/Output from previous day:   Intake/Output from this shift:    Labs:  Recent Labs  10/05/15 0547 10/06/15 0628 10/07/15 0624  WBC 11.7* 8.8 8.0  HGB 11.6* 11.1* 11.2*  PLT 201 184 197  CREATININE 0.70 0.67 0.63   Estimated Creatinine Clearance: 71.2 mL/min (by C-G formula based on Cr of 0.63). No results for input(s): VANCOTROUGH, VANCOPEAK, VANCORANDOM, GENTTROUGH, GENTPEAK, GENTRANDOM, TOBRATROUGH, TOBRAPEAK, TOBRARND, AMIKACINPEAK, AMIKACINTROU, AMIKACIN in the last 72 hours.   Microbiology: Recent Results (from the past 720 hour(s))  Blood Culture (routine x 2)     Status: None (Preliminary result)   Collection Time: 10/04/15  5:55 PM  Result Value Ref Range Status   Specimen Description BLOOD LEFT HAND  Final   Special Requests BOTTLES DRAWN AEROBIC AND ANAEROBIC 4CC EACH  Final   Culture NO GROWTH 3 DAYS  Final   Report Status PENDING  Incomplete  Blood Culture (routine x 2)     Status: None (Preliminary result)   Collection Time: 10/04/15  6:00 PM  Result Value Ref Range Status   Specimen Description BLOOD LEFT ARM  Final   Special Requests BOTTLES DRAWN AEROBIC AND ANAEROBIC 5CC EACH  Final   Culture NO GROWTH 3 DAYS  Final   Report Status PENDING  Incomplete  Urine culture     Status: None   Collection Time: 10/04/15  6:00 PM  Result Value Ref Range Status   Specimen Description URINE, CATHETERIZED  Final   Special Requests NONE  Final   Culture   Final    NO GROWTH 1 DAY Performed at [facility]    Report Status  10/05/2015 FINAL  Final    Anti-infectives    Start     Dose/Rate Route Frequency Ordered Stop   10/05/15 0600  vancomycin (VANCOCIN) IVPB 1000 mg/200 mL premix     1,000 mg 200 mL/hr over 60 Minutes Intravenous Every 12 hours 10/04/15 2035     10/05/15 0300  piperacillin-tazobactam (ZOSYN) IVPB 3.375 g     3.375 g 12.5 mL/hr over 240 Minutes Intravenous Every 8 hours 10/04/15 2035     10/04/15 2200  piperacillin-tazobactam (ZOSYN) IVPB 3.375 g  Status:  Discontinued     3.375 g 100 mL/hr over 30 Minutes Intravenous 3 times per day 10/04/15 1846 10/04/15 1908   10/04/15 1915  vancomycin (VANCOCIN) IVPB 1000 mg/200 mL premix     1,000 mg 200 mL/hr over 60 Minutes Intravenous  Once 10/04/15 1859 10/04/15 2045   10/04/15 1915  piperacillin-tazobactam (ZOSYN) IVPB 3.375 g     3.375 g 100 mL/hr over 30 Minutes Intravenous  Once 10/04/15 1908 10/04/15 1945      Assessment: 69 yo lady cont on empiric antibiotics for aspiration PNA and HCAP.  Labs are stable  Goal of Therapy:  Vancomycin trough level 15-20 mcg/ml   Plan:  Cont Vancomycin 1gm IV every 12 hours. Cont Zosyn 3.375gm IV every 8 hours. Measure antibiotic drug levels at steady state Follow up culture results  Sudeep Scheibel Poteet 10/07/2015,9:58 AM

## 2015-10-08 DIAGNOSIS — Z931 Gastrostomy status: Secondary | ICD-10-CM | POA: Diagnosis not present

## 2015-10-08 DIAGNOSIS — C9000 Multiple myeloma not having achieved remission: Secondary | ICD-10-CM | POA: Diagnosis not present

## 2015-10-08 DIAGNOSIS — J189 Pneumonia, unspecified organism: Secondary | ICD-10-CM

## 2015-10-08 DIAGNOSIS — Z800 Family history of malignant neoplasm of digestive organs: Secondary | ICD-10-CM | POA: Diagnosis not present

## 2015-10-08 DIAGNOSIS — I69354 Hemiplegia and hemiparesis following cerebral infarction affecting left non-dominant side: Secondary | ICD-10-CM | POA: Diagnosis not present

## 2015-10-08 DIAGNOSIS — E870 Hyperosmolality and hypernatremia: Secondary | ICD-10-CM | POA: Diagnosis not present

## 2015-10-08 DIAGNOSIS — J690 Pneumonitis due to inhalation of food and vomit: Secondary | ICD-10-CM | POA: Diagnosis not present

## 2015-10-08 DIAGNOSIS — E669 Obesity, unspecified: Secondary | ICD-10-CM | POA: Diagnosis not present

## 2015-10-08 DIAGNOSIS — E039 Hypothyroidism, unspecified: Secondary | ICD-10-CM | POA: Diagnosis not present

## 2015-10-08 DIAGNOSIS — Z66 Do not resuscitate: Secondary | ICD-10-CM | POA: Diagnosis not present

## 2015-10-08 DIAGNOSIS — Z85528 Personal history of other malignant neoplasm of kidney: Secondary | ICD-10-CM | POA: Diagnosis not present

## 2015-10-08 DIAGNOSIS — Z8249 Family history of ischemic heart disease and other diseases of the circulatory system: Secondary | ICD-10-CM | POA: Diagnosis not present

## 2015-10-08 DIAGNOSIS — E785 Hyperlipidemia, unspecified: Secondary | ICD-10-CM | POA: Diagnosis not present

## 2015-10-08 DIAGNOSIS — Z7401 Bed confinement status: Secondary | ICD-10-CM | POA: Diagnosis not present

## 2015-10-08 DIAGNOSIS — N183 Chronic kidney disease, stage 3 (moderate): Secondary | ICD-10-CM | POA: Diagnosis not present

## 2015-10-08 DIAGNOSIS — I69391 Dysphagia following cerebral infarction: Secondary | ICD-10-CM | POA: Diagnosis not present

## 2015-10-08 DIAGNOSIS — J8410 Pulmonary fibrosis, unspecified: Secondary | ICD-10-CM

## 2015-10-08 DIAGNOSIS — R569 Unspecified convulsions: Secondary | ICD-10-CM

## 2015-10-08 DIAGNOSIS — E876 Hypokalemia: Secondary | ICD-10-CM | POA: Diagnosis not present

## 2015-10-08 DIAGNOSIS — Z833 Family history of diabetes mellitus: Secondary | ICD-10-CM | POA: Diagnosis not present

## 2015-10-08 DIAGNOSIS — I129 Hypertensive chronic kidney disease with stage 1 through stage 4 chronic kidney disease, or unspecified chronic kidney disease: Secondary | ICD-10-CM | POA: Diagnosis not present

## 2015-10-08 DIAGNOSIS — I69351 Hemiplegia and hemiparesis following cerebral infarction affecting right dominant side: Secondary | ICD-10-CM | POA: Diagnosis not present

## 2015-10-08 DIAGNOSIS — R4701 Aphasia: Secondary | ICD-10-CM | POA: Diagnosis not present

## 2015-10-08 DIAGNOSIS — G40909 Epilepsy, unspecified, not intractable, without status epilepticus: Secondary | ICD-10-CM | POA: Diagnosis not present

## 2015-10-08 DIAGNOSIS — Z6828 Body mass index (BMI) 28.0-28.9, adult: Secondary | ICD-10-CM | POA: Diagnosis not present

## 2015-10-08 DIAGNOSIS — Z905 Acquired absence of kidney: Secondary | ICD-10-CM | POA: Diagnosis not present

## 2015-10-08 DIAGNOSIS — R1310 Dysphagia, unspecified: Secondary | ICD-10-CM | POA: Diagnosis not present

## 2015-10-08 LAB — CBC
HCT: 33.2 % — ABNORMAL LOW (ref 36.0–46.0)
HEMOGLOBIN: 11.4 g/dL — AB (ref 12.0–15.0)
MCH: 31.6 pg (ref 26.0–34.0)
MCHC: 34.3 g/dL (ref 30.0–36.0)
MCV: 92 fL (ref 78.0–100.0)
Platelets: 211 10*3/uL (ref 150–400)
RBC: 3.61 MIL/uL — ABNORMAL LOW (ref 3.87–5.11)
RDW: 16.7 % — ABNORMAL HIGH (ref 11.5–15.5)
WBC: 5.7 10*3/uL (ref 4.0–10.5)

## 2015-10-08 LAB — BASIC METABOLIC PANEL
ANION GAP: 5 (ref 5–15)
BUN: 15 mg/dL (ref 6–20)
CHLORIDE: 106 mmol/L (ref 101–111)
CO2: 27 mmol/L (ref 22–32)
CREATININE: 0.69 mg/dL (ref 0.44–1.00)
Calcium: 9.1 mg/dL (ref 8.9–10.3)
GFR calc non Af Amer: >60 mL/min (ref >60)
Glucose, Bld: 109 mg/dL — ABNORMAL HIGH (ref 65–99)
Potassium: 3.8 mmol/L (ref 3.5–5.1)
SODIUM: 138 mmol/L (ref 135–145)

## 2015-10-08 MED ORDER — LEVOTHYROXINE SODIUM 75 MCG PO TABS: 37.5000 ug | ORAL_TABLET | Freq: Every day | ORAL | Status: AC

## 2015-10-08 MED ORDER — PREDNISONE 20 MG PO TABS: 20.0000 mg | ORAL_TABLET | Freq: Every day | ORAL | Status: DC

## 2015-10-08 MED ORDER — AMOXICILLIN-POT CLAVULANATE 400-57 MG/5ML PO SUSR: 800.0000 mg | Freq: Two times a day (BID) | ORAL | Status: DC

## 2015-10-08 MED ORDER — HYDRALAZINE HCL 25 MG PO TABS: 25.0000 mg | ORAL_TABLET | Freq: Three times a day (TID) | ORAL | Status: DC

## 2015-10-08 MED ORDER — INFLUENZA VAC SPLIT QUAD 0.5 ML IM SUSY
0.5000 mL | PREFILLED_SYRINGE | Freq: Once | INTRAMUSCULAR | Status: AC
  Administered 2015-10-08: 0.5 mL via INTRAMUSCULAR

## 2015-10-08 MED ORDER — ALBUTEROL SULFATE (2.5 MG/3ML) 0.083% IN NEBU: 2.5000 mg | INHALATION_SOLUTION | Freq: Four times a day (QID) | RESPIRATORY_TRACT | Status: AC | PRN

## 2015-10-08 MED ORDER — CLONAZEPAM 0.5 MG PO TABS: 0.5000 mg | ORAL_TABLET | Freq: Three times a day (TID) | ORAL | Status: AC | PRN

## 2015-10-08 MED ORDER — RANITIDINE HCL 150 MG/10ML PO SYRP: 150.0000 mg | ORAL_SOLUTION | Freq: Every day | ORAL | Status: DC

## 2015-10-08 NOTE — Discharge Summary (Signed)
Physician Discharge Summary  Melissa [name] UUV:[id number] DOB: [birth year] DOA: 10/04/2015  PCP: [name], MD  Admit date: 10/04/2015 Discharge date: 10/08/2015  Time spent: 40 minutes  Recommendations for Outpatient Follow-up:  1. Patient will be discharged home in the care of her daughter  Discharge Diagnoses:  Principal Problem:   Aspiration pneumonia (HCC) Active Problems:   Hypothyroid   Pulmonary fibrosis (HCC)   Multiple myeloma without remission (HCC)   CKD (chronic kidney disease) stage 3, GFR 30-59 ml/min   Chronic ischemic left MCA stroke   Hemiplegia affecting right dominant side (HCC)   Partial seizure (HCC)   Essential hypertension   PEG (percutaneous endoscopic gastrostomy) status (HCC)   HCAP (healthcare-associated pneumonia)   Hypernatremia   Discharge Condition: improving  Diet recommendation: NPO, continue tube feedings  Filed Weights   10/04/15 1719 10/04/15 2101  Weight: 85.73 kg (189 lb) 81.012 kg (178 lb 9.6 oz)    History of present illness:  This patient was brought to the hospital with fever. She had nausea and vomiting the week prior. Chest x-ray in the emergency room indicated pneumonia. She is found to have a significant leukocytosis. She was started on vancomycin and Zosyn and was referred for admission.  Hospital Course:  1. Aspiration pneumonia and healthcare associated pneumonia. On admission, the patient's chest x-ray revealed bibasilar lower lobe airspace opacities left greater than right. She was febrile with a temperature of 100.0 and her white blood cell count was 14.1.  -In September 2016, she was treated for aspiration pneumonia. Subsequently, CT of the chest in September revealed pulmonary fibrosis with no airspace process but there was aspirate material over the right lower bronchus. -Reportedly, the patient had some vomiting a few days ago, so aspiration pneumonia is likely. -Blood cultures were ordered in the ED and  are negative to date.  -She was started on vancomycin and Zosyn; and had clinically improved. She has been transitioned to augmentin -She is afebrile now and her white blood cell count has trended down.  Pulmonary fibrosis. The patient does have a few pulmonary crackles on exam which could be secondary to pneumonia and fibrosis. - Low dose prednisone and Xopenex nebulizer were added. She is clinically improved and will be placed on a rapid course of prednisone. Continue bronchodilators.  Hypernatremia. Patient's serum sodium was 147 on admission. This could be secondary to mild dehydration and PEG feedings. -this was corrected with IV fluids and increasing free water down peg tube.  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 She is a DO NOT RESUSCITATE. She receives bolus tube feedings at home. -She has been continued on tube feedings, Plavix, and pravastatin.  Essential hypertension. Blood pressures were running high. She was continued on home dose of clonidine, amlodipine and coreg. Will add hydralazine, which can be further titrated as an outpatient  Hypothyroidism. Synthroid has been continued. Her TSH was within normal limits at 0.6.   Seizure disorder. Currently stable. Keppra and Vimpat have been continued. Daughter reports that Dilantin has been weaned off.  Mild hypokalemia. Improved with replacement  Recent vomiting. H2 blocker was added. No further vomiting  Procedures:    Consultations:    Discharge Exam: Filed Vitals:   10/08/15 0724  BP: 159/70  Pulse: 59  Temp: 98.7 F (37.1 C)  Resp: 16    General: NAD Cardiovascular: S1, S2 RRR Respiratory: CTA B  Discharge Instructions   Discharge Instructions  Increase activity slowly    Complete by:  As directed           Current Discharge Medication List    START taking these medications   Details   hydrALAZINE (APRESOLINE) 25 MG tablet Place 1 tablet (25 mg total) into feeding tube 3 (three) times daily. Qty: 90 tablet, Refills: 0    predniSONE (DELTASONE) 20 MG tablet Place 1 tablet (20 mg total) into feeding tube daily with breakfast. Qty: 3 tablet, Refills: 0    ranitidine (ZANTAC) 150 MG/10ML syrup Place 10 mLs (150 mg total) into feeding tube daily. Qty: 300 mL, Refills: 0      CONTINUE these medications which have CHANGED   Details  albuterol (PROVENTIL) (2.5 MG/3ML) 0.083% nebulizer solution Take 3 mLs (2.5 mg total) by nebulization every 6 (six) hours as needed for wheezing or shortness of breath. Qty: 75 mL, Refills: 12    amoxicillin-clavulanate (AUGMENTIN) 400-57 MG/5ML suspension Place 10 mLs (800 mg total) into feeding tube every 12 (twelve) hours. Qty: 40 mL, Refills: 0    clonazePAM (KLONOPIN) 0.5 MG tablet Place 1 tablet (0.5 mg total) into feeding tube 3 (three) times daily as needed for anxiety. Qty: 15 tablet, Refills: 0    levothyroxine (SYNTHROID, LEVOTHROID) 75 MCG tablet Take 0.5-1 tablets (37.5-75 mcg total) by mouth daily before breakfast. take one half tablet sat and sun and 1 tab mon- fri Qty: 26 tablet, Refills: 5   Associated Diagnoses: Hypothyroidism, unspecified hypothyroidism type      CONTINUE these medications which have NOT CHANGED   Details  acetaminophen (TYLENOL) 650 MG CR tablet 650 mg by Feeding Tube route every 8 (eight) hours as needed for pain.     amLODipine (NORVASC) 10 MG tablet Place 1 tablet (10 mg total) into feeding tube daily.    carvedilol (COREG) 6.25 MG tablet TAKE ONE TABLET INTO FEEDING TUBE TWICE DAILY WITH MEALS Qty: 60 tablet, Refills: 3    Cholecalciferol (VITAMIN D3) 50000 UNITS TABS Place 1 tablet into feeding tube once a week. By feeding tube    clopidogrel (PLAVIX) 75 MG tablet Place 1 tablet (75 mg total) into feeding tube daily.    DULoxetine (CYMBALTA) 30 MG capsule 30 mg by Feeding Tube route 2 (two)  times daily.    Associated Diagnoses: Multiple myeloma without remission (HCC)    HYDROcodone-acetaminophen (NORCO/VICODIN) 5-325 MG per tablet Place 1 tablet into feeding tube 2 (two) times daily as needed for moderate pain.    Lacosamide 150 MG TABS Place 1 tablet (150 mg total) into feeding tube 2 (two) times daily.    levETIRAcetam (KEPPRA) 100 MG/ML solution Place 10 mLs (1,000 mg total) into feeding tube 2 (two) times daily.    lovastatin (MEVACOR) 40 MG tablet Place 1 tablet (40 mg total) into feeding tube at bedtime.    Nutritional Supplements (FEEDING SUPPLEMENT, OSMOLITE 1.5 CAL,) LIQD Place 237 mLs into feeding tube 5 (five) times daily. Refills: 0    PROAIR HFA 108 (90 BASE) MCG/ACT inhaler Inhale 1-2 puffs into the lungs every 6 (six) hours as needed for wheezing or shortness of breath.     prochlorperazine (COMPAZINE) 10 MG tablet 10 mg by Feeding Tube route every 6 (six) hours as needed for nausea or vomiting.    Associated Diagnoses: Multiple myeloma without remission (HCC)    Water For Irrigation, Sterile (FREE WATER) SOLN Place 100 mLs into feeding tube every 4 (four) hours.    Amino Acids-Protein Hydrolys (FEEDING SUPPLEMENT,  PRO-STAT SUGAR FREE 64,) LIQD Place 30 mLs into feeding tube daily.    cloNIDine (CATAPRES - DOSED IN MG/24 HR) 0.3 mg/24hr patch APPLY 1 PATCH TOPICALLY ONCE A WEEK Qty: 4 patch, Refills: 2      STOP taking these medications     phenytoin (DILANTIN) 125 MG/5ML suspension      VIMPAT 10 MG/ML oral solution        Allergies  Allergen Reactions  . Morphine Nausea And Vomiting   Follow-up Information    Follow up with [facility].   Contact information:   [address] [place] [address] [phone]        The results of significant diagnostics from this hospitalization (including imaging, microbiology, ancillary and laboratory) are listed below for reference.    Significant Diagnostic Studies: Dg  Chest Port 1 View  10/04/2015  CLINICAL DATA:  Altered mental status, fever since yesterday. Nonproductive cough for 3 days. EXAM: PORTABLE CHEST 1 VIEW COMPARISON:  09/04/2015 FINDINGS: There is cardiomegaly with vascular congestion. Low lung volumes. Bibasilar opacities, left greater than right. Cannot exclude pneumonia, particularly in the left base. No visible effusions. No acute bony abnormality. IMPRESSION: Cardiomegaly with vascular congestion. Low lung volumes. Bilateral lower lobe airspace opacities, left greater than right. Cannot exclude pneumonia. Electronically Signed   By: Charlett Nose M.D.   On: 10/04/2015 17:55    Microbiology: Recent Results (from the past 240 hour(s))  Blood Culture (routine x 2)     Status: None (Preliminary result)   Collection Time: 10/04/15  5:55 PM  Result Value Ref Range Status   Specimen Description BLOOD LEFT HAND  Final   Special Requests BOTTLES DRAWN AEROBIC AND ANAEROBIC 4CC EACH  Final   Culture NO GROWTH 4 DAYS  Final   Report Status PENDING  Incomplete  Blood Culture (routine x 2)     Status: None (Preliminary result)   Collection Time: 10/04/15  6:00 PM  Result Value Ref Range Status   Specimen Description BLOOD LEFT ARM  Final   Special Requests BOTTLES DRAWN AEROBIC AND ANAEROBIC 5CC EACH  Final   Culture NO GROWTH 4 DAYS  Final   Report Status PENDING  Incomplete  Urine culture     Status: None   Collection Time: 10/04/15  6:00 PM  Result Value Ref Range Status   Specimen Description URINE, CATHETERIZED  Final   Special Requests NONE  Final   Culture   Final    NO GROWTH 1 DAY Performed at [facility]    Report Status 10/05/2015 FINAL  Final     Labs: Basic Metabolic Panel:  Recent Labs Lab 10/04/15 1755 10/05/15 0547 10/06/15 0628 10/07/15 0624 10/08/15 0613  NA 147* 146* 144 140 138  K 3.4* 3.4* 3.3* 4.0 3.8  CL 108 109 107 106 106  CO2 33* 32 30 30 27   GLUCOSE 117* 97 137* 93 109*  BUN 23* 23* 22* 18 15   CREATININE 0.66 0.70 0.67 0.63 0.69  CALCIUM 9.5 9.1 9.2 9.3 9.1   Liver Function Tests:  Recent Labs Lab 10/04/15 1755 10/05/15 0547  AST 23 26  ALT 31 34  ALKPHOS 77 71  BILITOT 0.7 0.7  PROT 8.1 7.5  ALBUMIN 2.8* 2.6*   No results for input(s): LIPASE, AMYLASE in the last 168 hours. No results for input(s): AMMONIA in the last 168 hours. CBC:  Recent Labs Lab 10/04/15 1755 10/05/15 0547 10/06/15 2440 10/07/15 1027 10/08/15 2536  WBC 14.1* 11.7* 8.8 8.0 5.7  NEUTROABS 10.7*  --   --   --   --   HGB 13.0 11.6* 11.1* 11.2* 11.4*  HCT 37.7 33.9* 32.2* 32.7* 33.2*  MCV 95.7 95.5 95.0 93.7 92.0  PLT 210 201 184 197 211   Cardiac Enzymes: No results for input(s): CKTOTAL, CKMB, CKMBINDEX, TROPONINI in the last 168 hours. BNP: BNP (last 3 results) No results for input(s): BNP in the last 8760 hours.  ProBNP (last 3 results) No results for input(s): PROBNP in the last 8760 hours.  CBG:  Recent Labs Lab 10/04/15 1726  GLUCAP 93       Signed:  Rion Catala  [facility] 10/08/2015, 11:19 AM

## 2015-10-08 NOTE — Progress Notes (Signed)
Daughter at bedside.

## 2015-10-08 NOTE — Progress Notes (Signed)
Pt's IV catheter removed and intact. Pt's IV site clean dry and intact. Discharge instructions, follow up appointments and medications reviewed and discussed with patient's daughter. All questions were answered and no further questions at this time. Pt's daughter verbalized understanding. Pt in stable condition and in no acute distress at time of discharge. Pt to be escorted by [facility].

## 2015-10-09 LAB — CULTURE, BLOOD (ROUTINE X 2)
CULTURE: NO GROWTH
CULTURE: NO GROWTH

## 2015-10-10 DIAGNOSIS — R627 Adult failure to thrive: Secondary | ICD-10-CM | POA: Diagnosis not present

## 2015-10-10 DIAGNOSIS — I129 Hypertensive chronic kidney disease with stage 1 through stage 4 chronic kidney disease, or unspecified chronic kidney disease: Secondary | ICD-10-CM | POA: Diagnosis not present

## 2015-10-10 DIAGNOSIS — G40909 Epilepsy, unspecified, not intractable, without status epilepticus: Secondary | ICD-10-CM | POA: Diagnosis not present

## 2015-10-10 DIAGNOSIS — I69320 Aphasia following cerebral infarction: Secondary | ICD-10-CM | POA: Diagnosis not present

## 2015-10-10 DIAGNOSIS — N183 Chronic kidney disease, stage 3 (moderate): Secondary | ICD-10-CM | POA: Diagnosis not present

## 2015-10-10 DIAGNOSIS — I69351 Hemiplegia and hemiparesis following cerebral infarction affecting right dominant side: Secondary | ICD-10-CM | POA: Diagnosis not present

## 2015-10-10 DIAGNOSIS — E039 Hypothyroidism, unspecified: Secondary | ICD-10-CM | POA: Diagnosis not present

## 2015-10-10 DIAGNOSIS — J690 Pneumonitis due to inhalation of food and vomit: Secondary | ICD-10-CM | POA: Diagnosis not present

## 2015-10-10 DIAGNOSIS — Z431 Encounter for attention to gastrostomy: Secondary | ICD-10-CM | POA: Diagnosis not present

## 2015-10-11 DIAGNOSIS — Z431 Encounter for attention to gastrostomy: Secondary | ICD-10-CM | POA: Diagnosis not present

## 2015-10-11 DIAGNOSIS — E039 Hypothyroidism, unspecified: Secondary | ICD-10-CM | POA: Diagnosis not present

## 2015-10-11 DIAGNOSIS — J690 Pneumonitis due to inhalation of food and vomit: Secondary | ICD-10-CM | POA: Diagnosis not present

## 2015-10-11 DIAGNOSIS — I69351 Hemiplegia and hemiparesis following cerebral infarction affecting right dominant side: Secondary | ICD-10-CM | POA: Diagnosis not present

## 2015-10-11 DIAGNOSIS — I129 Hypertensive chronic kidney disease with stage 1 through stage 4 chronic kidney disease, or unspecified chronic kidney disease: Secondary | ICD-10-CM | POA: Diagnosis not present

## 2015-10-11 DIAGNOSIS — I69320 Aphasia following cerebral infarction: Secondary | ICD-10-CM | POA: Diagnosis not present

## 2015-10-11 DIAGNOSIS — G40909 Epilepsy, unspecified, not intractable, without status epilepticus: Secondary | ICD-10-CM | POA: Diagnosis not present

## 2015-10-11 DIAGNOSIS — N183 Chronic kidney disease, stage 3 (moderate): Secondary | ICD-10-CM | POA: Diagnosis not present

## 2015-10-11 DIAGNOSIS — R627 Adult failure to thrive: Secondary | ICD-10-CM | POA: Diagnosis not present

## 2015-10-12 DIAGNOSIS — R627 Adult failure to thrive: Secondary | ICD-10-CM | POA: Diagnosis not present

## 2015-10-12 DIAGNOSIS — N183 Chronic kidney disease, stage 3 (moderate): Secondary | ICD-10-CM | POA: Diagnosis not present

## 2015-10-12 DIAGNOSIS — J690 Pneumonitis due to inhalation of food and vomit: Secondary | ICD-10-CM | POA: Diagnosis not present

## 2015-10-12 DIAGNOSIS — I69351 Hemiplegia and hemiparesis following cerebral infarction affecting right dominant side: Secondary | ICD-10-CM | POA: Diagnosis not present

## 2015-10-12 DIAGNOSIS — Z431 Encounter for attention to gastrostomy: Secondary | ICD-10-CM | POA: Diagnosis not present

## 2015-10-12 DIAGNOSIS — G40909 Epilepsy, unspecified, not intractable, without status epilepticus: Secondary | ICD-10-CM | POA: Diagnosis not present

## 2015-10-12 DIAGNOSIS — I69320 Aphasia following cerebral infarction: Secondary | ICD-10-CM | POA: Diagnosis not present

## 2015-10-12 DIAGNOSIS — I129 Hypertensive chronic kidney disease with stage 1 through stage 4 chronic kidney disease, or unspecified chronic kidney disease: Secondary | ICD-10-CM | POA: Diagnosis not present

## 2015-10-12 DIAGNOSIS — E039 Hypothyroidism, unspecified: Secondary | ICD-10-CM | POA: Diagnosis not present

## 2015-10-16 ENCOUNTER — Encounter: Payer: Self-pay | Admitting: Family Medicine

## 2015-10-16 ENCOUNTER — Ambulatory Visit (INDEPENDENT_AMBULATORY_CARE_PROVIDER_SITE_OTHER): Payer: Medicare Other | Admitting: Family Medicine

## 2015-10-16 VITALS — BP 124/76 | HR 70 | Resp 18

## 2015-10-16 DIAGNOSIS — R42 Dizziness and giddiness: Secondary | ICD-10-CM

## 2015-10-16 DIAGNOSIS — J8410 Pulmonary fibrosis, unspecified: Secondary | ICD-10-CM

## 2015-10-16 DIAGNOSIS — F329 Major depressive disorder, single episode, unspecified: Secondary | ICD-10-CM

## 2015-10-16 DIAGNOSIS — M25521 Pain in right elbow: Secondary | ICD-10-CM | POA: Diagnosis not present

## 2015-10-16 DIAGNOSIS — G8191 Hemiplegia, unspecified affecting right dominant side: Secondary | ICD-10-CM

## 2015-10-16 DIAGNOSIS — Z931 Gastrostomy status: Secondary | ICD-10-CM

## 2015-10-16 DIAGNOSIS — G40909 Epilepsy, unspecified, not intractable, without status epilepticus: Secondary | ICD-10-CM | POA: Diagnosis not present

## 2015-10-16 DIAGNOSIS — Z09 Encounter for follow-up examination after completed treatment for conditions other than malignant neoplasm: Secondary | ICD-10-CM | POA: Diagnosis not present

## 2015-10-16 DIAGNOSIS — F32A Depression, unspecified: Secondary | ICD-10-CM

## 2015-10-16 MED ORDER — CLONIDINE HCL 0.3 MG/24HR TD PTWK: 0.3000 mg | MEDICATED_PATCH | TRANSDERMAL | Status: AC

## 2015-10-16 MED ORDER — LOVASTATIN 40 MG PO TABS: 40.0000 mg | ORAL_TABLET | Freq: Every day | ORAL | Status: AC

## 2015-10-16 MED ORDER — CLOPIDOGREL BISULFATE 75 MG PO TABS: 75.0000 mg | ORAL_TABLET | Freq: Every day | ORAL | Status: AC

## 2015-10-16 MED ORDER — MECLIZINE HCL 25 MG PO TABS: 25.0000 mg | ORAL_TABLET | Freq: Three times a day (TID) | ORAL | Status: AC | PRN

## 2015-10-16 MED ORDER — HYDRALAZINE HCL 25 MG PO TABS: 25.0000 mg | ORAL_TABLET | Freq: Three times a day (TID) | ORAL | Status: DC

## 2015-10-16 MED ORDER — DICLOFENAC SODIUM 1 % TD GEL: TRANSDERMAL | Status: AC

## 2015-10-16 MED ORDER — RANITIDINE HCL 150 MG/10ML PO SYRP: 150.0000 mg | ORAL_SOLUTION | Freq: Every day | ORAL | Status: AC

## 2015-10-16 NOTE — Progress Notes (Signed)
Melissa Nakayama, MD [address] [place] [address]  Smoldering multiple myeloma [facility]) - Plan: CBC with Differential, Comprehensive metabolic panel, Multiple myeloma panel, serum, Kappa/lambda light chains  Hypokalemia - Plan: potassium chloride SA (K-DUR,KLOR-CON) 20 MEQ tablet  CURRENT THERAPY: Observation  INTERVAL HISTORY: Melissa [name] 69 y.o. female returns for followup of IgG kappa multiple myeloma (smoldering myeloma by definition with >/= 3 g/dL of IgG, bone marrow biopsy showing 20% plasma cells, and without any end-organ damage from myeloma) with bence hone proteinuria with a BMBX demonstrating 20% plasma cells, S/P 1 cycle of RVD in September 3086, complicated by severe CVA.    Smoldering multiple myeloma ([place])   04/29/2014 Imaging No lytic lesions.   08/15/2014 Bone Marrow Biopsy Bone Marrow, Aspirate,Biopsy, and Clot, right iliac - HYPERCELLULAR BONE MARROW FOR AGE WITH TRILINEAGE HEMATOPOIESIS. - PLASMACYTOSIS (PLASMA CELLS 20%). There appears to be kappa light chain excess in plasma cells strongly suggestive of plasma cell d...   09/05/2014 - 09/15/2014 Chemotherapy Revlimid (25 mg days 1-14 every 21 days), Velcade (D1, 4, 8, and 11 every 21 days) and dexamethasone x 1 cycle   09/18/2014 - 10/01/2014 Hospital Admission Large acute Left MCA CVA (cerebral infarction), Aphasia, residual from large acute CVA , Dysphagia status post PEG tube placement   11/10/2014 Treatment Plan Change Oncology care transferred to [facility] due to [facility] home placement. "I do not plan to recheck her myeloma protein panel today. Her estimated performance status score is 3-4 and is not a candidate for systemic treatment."    05/08/2015 Treatment Plan Change Oncology care tranferred back to [place]    07/24/2015 - 08/03/2015 Hospital Admission Seizure, simple partial, new onset   09/04/2015 - 09/09/2015 Hospital Admission Aspiration pneumonia   10/04/2015 -  10/08/2015 Hospital Admission Aspiration pneumonia     I personally reviewed and went over laboratory results with the patient.  The results are noted within this dictation.  Hypokalemia is noted.  Will address.  I personally reviewed and went over radiographic studies with the patient.  The results are noted within this dictation.   Chart reviewed.  She recently saw Dr. Moshe Cipro.  She remains aphasic.  She is seen being tearful occasionally during her visit today.       Past Medical History  Diagnosis Date  . Vertigo, intermittent   . Hypothyroidism   . Hyperlipidemia   . Obesity   . Hypertension     severe and resistant to treatment; 2008-negative left renal angiogram  . Vitiligo   . Degenerative disc disease, lumbar   . Degenerative disc disease, cervical   . Chest pain 2008    2008-normal coronary angiography  . Stroke ([place]) 09/18/2014    right hemiparesis  . Renal cell carcinoma     Right nephrectomy  . Multiple myeloma ([place]) 08/26/2014  . History of pneumonia   . Emphysema of lung ([place])   . CVA (cerebral infarction) 06/2015    has Hypothyroid; Vitamin D deficiency; Obesity; Depression; HEARING LOSS; VERTIGO, INTERMITTENT; Vitiligo; Hyperlipidemia; History of kidney cancer; Anemia; Pulmonary fibrosis ([place]); Lung nodules; Positive ANA (antinuclear antibody); Knee pain, right; Radicular low back pain; Difficulty walking; Bilateral leg weakness; Bence Jones proteinuria; Smoldering multiple myeloma ([place]); Thrombocytopenia, unspecified ([place]); CKD (chronic kidney disease) stage 3, GFR 30-59 ml/min; Dysphagia due to recent stroke; Pain in right elbow; Medicare annual wellness visit, subsequent; Need for vaccination with 13-polyvalent pneumococcal conjugate vaccine;  Thyroid nodule; Chronic ischemic left MCA stroke; Seizure ([place]); Left lower lobe pneumonia; Urinary tract infection; Left hip pain; Hemiplegia affecting right dominant side ([place]); Multiple myeloma ([place]); Acute ischemic  stroke ([place]); Partial seizure ([place]); Right elbow pain; Stroke ([place]); Acute cystitis without hematuria; CAP (community acquired pneumonia); CKD (chronic kidney disease); Essential hypertension; HLD (hyperlipidemia); Other specified hypothyroidism; Dysphagia; Right foot drop; Bacteremia due to Staphylococcus; Chronic kidney disease; C. difficile colitis; Aspiration pneumonia ([place]); Altered mental status; Elevated LFTs; Seizure disorder ([place]); PEG (percutaneous endoscopic gastrostomy) status ([place]); Dehydration; HCAP (healthcare-associated pneumonia); and Hypernatremia on her problem list.     is allergic to morphine.  Current Outpatient Prescriptions on File Prior to Visit  Medication Sig Dispense Refill  . acetaminophen (TYLENOL) 650 MG CR tablet 650 mg by Feeding Tube route every 8 (eight) hours as needed for pain.     Marland Kitchen albuterol (PROVENTIL) (2.5 MG/3ML) 0.083% nebulizer solution Take 3 mLs (2.5 mg total) by nebulization every 6 (six) hours as needed for wheezing or shortness of breath. 75 mL 12  . amLODipine (NORVASC) 10 MG tablet Place 1 tablet (10 mg total) into feeding tube daily.    . carvedilol (COREG) 6.25 MG tablet TAKE ONE TABLET INTO FEEDING TUBE TWICE DAILY WITH MEALS 60 tablet 3  . Cholecalciferol (VITAMIN D3) 50000 UNITS TABS Place 1 tablet into feeding tube once a week. By feeding tube    . clonazePAM (KLONOPIN) 0.5 MG tablet Place 1 tablet (0.5 mg total) into feeding tube 3 (three) times daily as needed for anxiety. 15 tablet 0  . cloNIDine (CATAPRES - DOSED IN MG/24 HR) 0.3 mg/24hr patch Place 1 patch (0.3 mg total) onto the skin once a week. 4 patch 2  . clopidogrel (PLAVIX) 75 MG tablet Place 1 tablet (75 mg total) into feeding tube daily. 30 tablet 3  . diclofenac sodium (VOLTAREN) 1 % GEL Apply twice daily, as needed to both knees, for pain and swelling 100 g 3  . DULoxetine (CYMBALTA) 30 MG capsule 30 mg by Feeding Tube route 2 (two) times daily.     . hydrALAZINE (APRESOLINE) 25  MG tablet Place 1 tablet (25 mg total) into feeding tube 3 (three) times daily. 90 tablet 0  . Lacosamide 150 MG TABS Place 1 tablet (150 mg total) into feeding tube 2 (two) times daily.    Marland Kitchen levETIRAcetam (KEPPRA) 100 MG/ML solution Place 10 mLs (1,000 mg total) into feeding tube 2 (two) times daily.    Marland Kitchen levothyroxine (SYNTHROID, LEVOTHROID) 75 MCG tablet Take 0.5-1 tablets (37.5-75 mcg total) by mouth daily before breakfast. take one half tablet sat and sun and 1 tab mon- fri 26 tablet 5  . lovastatin (MEVACOR) 40 MG tablet Place 1 tablet (40 mg total) into feeding tube at bedtime. 30 tablet 3  . meclizine (ANTIVERT) 25 MG tablet Take 1 tablet (25 mg total) by mouth 3 (three) times daily as needed for dizziness. 30 tablet 0  . Nutritional Supplements (FEEDING SUPPLEMENT, OSMOLITE 1.5 CAL,) LIQD Place 237 mLs into feeding tube 5 (five) times daily.  0  . PROAIR HFA 108 (90 BASE) MCG/ACT inhaler Inhale 1-2 puffs into the lungs every 6 (six) hours as needed for wheezing or shortness of breath.     . prochlorperazine (COMPAZINE) 10 MG tablet 10 mg by Feeding Tube route every 6 (six) hours as needed for nausea or vomiting.     . ranitidine (ZANTAC) 150 MG/10ML syrup Place 10 mLs (150 mg total) into feeding tube  daily. 300 mL 1  . Water For Irrigation, Sterile (FREE WATER) SOLN Place 100 mLs into feeding tube every 4 (four) hours.    . Amino Acids-Protein Hydrolys (FEEDING SUPPLEMENT, PRO-STAT SUGAR FREE 64,) LIQD Place 30 mLs into feeding tube daily. (Patient not taking: Reported on 10/17/2015)     No current facility-administered medications on file prior to visit.    Past Surgical History  Procedure Laterality Date  . Tubal ligation  1983    Bilateral  . Nephrectomy      Right; secondary to renal cell carcinoma  . Colonoscopy N/A 04/21/2014    Procedure: COLONOSCOPY;  Surgeon: Rogene Houston, MD;  Location[facility];  Service: Endoscopy;  Laterality: N/A;  . Colonoscopy N/A 04/21/2014     Procedure: COLONOSCOPY;  Surgeon: Rogene Houston, MD;  Location: [facility] ENDO [facility];  Service: Endoscopy;  Laterality: N/A;  1030  . Tee without cardioversion N/A 09/23/2014    Procedure: TRANSESOPHAGEAL ECHOCARDIOGRAM (TEE);  Surgeon: Sanda Klein, MD;  Location: [facility] ENDOSCOPY;  Service: Cardiovascular;  Laterality: N/A;  . Peg tube placement  09/2014    Aphasic   PHYSICAL EXAMINATION  ECOG PERFORMANCE STATUS: 4 - Bedbound  Filed Vitals:   10/17/15 0900  BP: 140/115  Pulse: 65  Temp: 97.7 F (36.5 C)  Resp: 15    GENERAL:well developed, crying, obese and aphasic, accompanied by daughters. SKIN: skin color, texture, turgor are normal, no rashes or significant lesions HEAD: Normocephalic, No masses, lesions, tenderness or abnormalities EYES: normal, PERRLA, EOMI, Conjunctiva are pink and non-injected EARS: External ears normal OROPHARYNX:lips, buccal mucosa, and tongue normal and mucous membranes are moist  NECK: supple, trachea midline LYMPH:  not examined BREAST:not examined LUNGS: clear to auscultation  HEART: regular rate & rhythm ABDOMEN:abdomen soft, non-tender, obese and normal bowel sounds BACK: Back symmetric, no curvature. EXTREMITIES:less then 2 second capillary refill, no joint deformities, effusion, or inflammation, no skin discoloration, no cyanosis  NEURO: aphasia, in wheelchair.   LABORATORY DATA: CBC    Component Value Date/Time   WBC 6.6 10/17/2015 0847   RBC 3.73* 10/17/2015 0847   RBC 3.70* 07/31/2011 1245   HGB 12.2 10/17/2015 0847   HCT 34.8* 10/17/2015 0847   PLT 240 10/17/2015 0847   MCV 93.3 10/17/2015 0847   MCH 32.7 10/17/2015 0847   MCHC 35.1 10/17/2015 0847   RDW 17.5* 10/17/2015 0847   LYMPHSABS 1.5 10/17/2015 0847   MONOABS 0.4 10/17/2015 0847   EOSABS 0.1 10/17/2015 0847   BASOSABS 0.0 10/17/2015 0847      Chemistry      Component Value Date/Time   NA 138 10/17/2015 0847   K 3.4* 10/17/2015 0847   CL 99* 10/17/2015 0847   CO2  33* 10/17/2015 0847   BUN 18 10/17/2015 0847   CREATININE 0.64 10/17/2015 0847   CREATININE 0.68 04/03/2015 1122      Component Value Date/Time   CALCIUM 9.6 10/17/2015 0847   ALKPHOS 67 10/17/2015 0847   AST 14* 10/17/2015 0847   ALT 16 10/17/2015 0847   BILITOT 0.6 10/17/2015 0847     Lab Results  Component Value Date   PROT 7.1 10/17/2015   ALBUMINELP 3.0* 05/08/2015   A1GS 0.4 05/08/2015   A2GS 1.0 05/08/2015   BETS 1.1 05/08/2015   BETA2SER 3.6 09/05/2014   GAMS 2.4* 05/08/2015   MSPIKE 2.1* 05/08/2015   SPEI Comment 05/08/2015   SPECOM Comment 05/08/2015   IGGSERUM 2972* 05/08/2015   IGGSERUM 3085* 05/08/2015  IGA 216 05/08/2015   IGA 230 05/08/2015   IGMSERUM 52 05/08/2015   IGMSERUM 54 05/08/2015   IMMELINT (NOTE) 09/05/2014   KPAFRELGTCHN 78.61* 05/08/2015   LAMBDASER 31.55* 05/08/2015   KAPLAMBRATIO 2.49* 05/08/2015     PENDING LABS:   RADIOGRAPHIC STUDIES:  Dg Chest Port 1 View  10/04/2015  CLINICAL DATA:  Altered mental status, fever since yesterday. Nonproductive cough for 3 days. EXAM: PORTABLE CHEST 1 VIEW COMPARISON:  09/04/2015 FINDINGS: There is cardiomegaly with vascular congestion. Low lung volumes. Bibasilar opacities, left greater than right. Cannot exclude pneumonia, particularly in the left base. No visible effusions. No acute bony abnormality. IMPRESSION: Cardiomegaly with vascular congestion. Low lung volumes. Bilateral lower lobe airspace opacities, left greater than right. Cannot exclude pneumonia. Electronically Signed   By: Rolm Baptise M.D.   On: 10/04/2015 17:55     PATHOLOGY:    ASSESSMENT AND PLAN:  Smoldering multiple myeloma (HCC) IgG kappa multiple myeloma (smoldering myeloma by definition with >/= 3 g/dL of IgG, bone marrow biopsy showing 20% plasma cells, and without any end-organ damage from myeloma) with bence hone proteinuria with a BMBX demonstrating 20% plasma cells, S/P 1 cycle of RVD in September 8102, complicated by  severe CVA.  Oncology history developed.  Diagnosis corrected in dictation and problem list.  Labs today and and 3 months: CBC diff, CMET, MM panel.  Utility of frequent labs is not useful as she is not a candidate for treatment.  Hypokalemia is noted at 3.4.  She is not on HCTZ BP medication.  Since she is aphasic, she would be unable to describe any signs or symptoms of hypokalemia.  Her family crushes pills for administration.  As a result, daily Kdur at 20 mEq would be beneficial for avoidance of hypokalemia.  Continue observation as planned.  Performance status precludes treatment at this time, additionally, in light of her smoldering myeloma, she technically does not qualify for treatment without any multiple myeloma-induced end-organ damage.  She is S/P influenza vaccine.  She is well cared-for by family members.  Return in 3 months for follow-up.  I think if all is well in 3 months, we could consider 4 month or 6 month follow-up.   THERAPY PLAN:  No indication for treatment at this time.  She is not a candidate for treatment in the future either.  We will be on the look-out for multiple myeloma-induced end-organ damage: C= Calcium increase of greater than 11.5 mg/dL R = Renal insufficiency with creatinine > 2 mg/dL A= Anemia with Hgb < 10 g/dL B= Bone disease with lytic lesions and/or osteopenia   All questions were answered. The patient knows to call the clinic with any problems, questions or concerns. We can certainly see the patient much sooner if necessary.  Patient and plan discussed with Dr. Ancil Linsey and she is in agreement with the aforementioned.   This note is electronically signed by: Doy Mince 10/17/2015 10:15 AM

## 2015-10-16 NOTE — Patient Instructions (Signed)
F/u in January, call if you need me before  Thankful that you are much improved.  Medications as discussed, any concerns/ problems , please call  Love is a powerful healer, and you ladies are showing that as you care for your Mom, Melissa [name], thank you both!  Thanks for choosing [facility], we consider it a privelige to serve you.

## 2015-10-16 NOTE — Assessment & Plan Note (Addendum)
IgG kappa multiple myeloma (smoldering myeloma by definition with >/= 3 g/dL of IgG, bone marrow biopsy showing 20% plasma cells, and without any end-organ damage from myeloma) with bence hone proteinuria with a BMBX demonstrating 20% plasma cells, S/P 1 cycle of RVD in September 4037, complicated by severe CVA.  Oncology history developed.  Diagnosis corrected in dictation and problem list.  Labs today and and 3 months: CBC diff, CMET, MM panel.  Utility of frequent labs is not useful as she is not a candidate for treatment.  Hypokalemia is noted at 3.4.  She is not on HCTZ BP medication.  Since she is aphasic, she would be unable to describe any signs or symptoms of hypokalemia.  Her family crushes pills for administration.  As a result, daily Kdur at 20 mEq would be beneficial for avoidance of hypokalemia.  Continue observation as planned.  Performance status precludes treatment at this time, additionally, in light of her smoldering myeloma, she technically does not qualify for treatment without any multiple myeloma-induced end-organ damage.  She is S/P influenza vaccine.  She is well cared-for by family members.  Return in 3 months for follow-up.  I think if all is well in 3 months, we could consider 4 month or 6 month follow-up.

## 2015-10-17 ENCOUNTER — Encounter (HOSPITAL_COMMUNITY): Payer: Medicare Other | Attending: Oncology | Admitting: Oncology

## 2015-10-17 ENCOUNTER — Encounter (HOSPITAL_BASED_OUTPATIENT_CLINIC_OR_DEPARTMENT_OTHER): Payer: Medicare Other

## 2015-10-17 ENCOUNTER — Encounter (HOSPITAL_COMMUNITY): Payer: Self-pay | Admitting: Oncology

## 2015-10-17 VITALS — BP 140/115 | HR 65 | Temp 97.7°F | Resp 15

## 2015-10-17 DIAGNOSIS — E876 Hypokalemia: Secondary | ICD-10-CM

## 2015-10-17 DIAGNOSIS — N183 Chronic kidney disease, stage 3 (moderate): Secondary | ICD-10-CM | POA: Diagnosis not present

## 2015-10-17 DIAGNOSIS — I69351 Hemiplegia and hemiparesis following cerebral infarction affecting right dominant side: Secondary | ICD-10-CM | POA: Diagnosis not present

## 2015-10-17 DIAGNOSIS — Z431 Encounter for attention to gastrostomy: Secondary | ICD-10-CM | POA: Diagnosis not present

## 2015-10-17 DIAGNOSIS — G40909 Epilepsy, unspecified, not intractable, without status epilepticus: Secondary | ICD-10-CM | POA: Diagnosis not present

## 2015-10-17 DIAGNOSIS — I129 Hypertensive chronic kidney disease with stage 1 through stage 4 chronic kidney disease, or unspecified chronic kidney disease: Secondary | ICD-10-CM | POA: Diagnosis not present

## 2015-10-17 DIAGNOSIS — C9000 Multiple myeloma not having achieved remission: Secondary | ICD-10-CM | POA: Insufficient documentation

## 2015-10-17 DIAGNOSIS — E039 Hypothyroidism, unspecified: Secondary | ICD-10-CM | POA: Diagnosis not present

## 2015-10-17 DIAGNOSIS — D472 Monoclonal gammopathy: Secondary | ICD-10-CM

## 2015-10-17 DIAGNOSIS — I69320 Aphasia following cerebral infarction: Secondary | ICD-10-CM | POA: Diagnosis not present

## 2015-10-17 DIAGNOSIS — J690 Pneumonitis due to inhalation of food and vomit: Secondary | ICD-10-CM | POA: Diagnosis not present

## 2015-10-17 DIAGNOSIS — R627 Adult failure to thrive: Secondary | ICD-10-CM | POA: Diagnosis not present

## 2015-10-17 LAB — CBC WITH DIFFERENTIAL/PLATELET
BASOS PCT: 0 %
Basophils Absolute: 0 10*3/uL (ref 0.0–0.1)
EOS ABS: 0.1 10*3/uL (ref 0.0–0.7)
EOS PCT: 1 %
HCT: 34.8 % — ABNORMAL LOW (ref 36.0–46.0)
HEMOGLOBIN: 12.2 g/dL (ref 12.0–15.0)
Lymphocytes Relative: 22 %
Lymphs Abs: 1.5 10*3/uL (ref 0.7–4.0)
MCH: 32.7 pg (ref 26.0–34.0)
MCHC: 35.1 g/dL (ref 30.0–36.0)
MCV: 93.3 fL (ref 78.0–100.0)
Monocytes Absolute: 0.4 10*3/uL (ref 0.1–1.0)
Monocytes Relative: 7 %
NEUTROS PCT: 70 %
Neutro Abs: 4.6 10*3/uL (ref 1.7–7.7)
PLATELETS: 240 10*3/uL (ref 150–400)
RBC: 3.73 MIL/uL — AB (ref 3.87–5.11)
RDW: 17.5 % — ABNORMAL HIGH (ref 11.5–15.5)
WBC: 6.6 10*3/uL (ref 4.0–10.5)

## 2015-10-17 LAB — COMPREHENSIVE METABOLIC PANEL
ALBUMIN: 2.8 g/dL — AB (ref 3.5–5.0)
ALK PHOS: 67 U/L (ref 38–126)
ALT: 16 U/L (ref 14–54)
ANION GAP: 6 (ref 5–15)
AST: 14 U/L — ABNORMAL LOW (ref 15–41)
BUN: 18 mg/dL (ref 6–20)
CALCIUM: 9.6 mg/dL (ref 8.9–10.3)
CHLORIDE: 99 mmol/L — AB (ref 101–111)
CO2: 33 mmol/L — AB (ref 22–32)
Creatinine, Ser: 0.64 mg/dL (ref 0.44–1.00)
GFR calc non Af Amer: >60 mL/min (ref >60)
GLUCOSE: 116 mg/dL — AB (ref 65–99)
Potassium: 3.4 mmol/L — ABNORMAL LOW (ref 3.5–5.1)
SODIUM: 138 mmol/L (ref 135–145)
Total Bilirubin: 0.6 mg/dL (ref 0.3–1.2)
Total Protein: 7.1 g/dL (ref 6.5–8.1)

## 2015-10-17 MED ORDER — POTASSIUM CHLORIDE CRYS ER 20 MEQ PO TBCR: 20.0000 meq | EXTENDED_RELEASE_TABLET | Freq: Once | ORAL | Status: AC

## 2015-10-17 NOTE — Patient Instructions (Signed)
[place] at [facility] Discharge Instructions  RECOMMENDATIONS MADE BY THE CONSULTANT AND ANY TEST RESULTS WILL BE SENT TO YOUR REFERRING PHYSICIAN.  Exam and discussion by Robynn Pane, PA-C Prescribed Kdur - take as directed If any concerns with today's labs we will let you know  Follow-up i 3 months with labs and office visit.  Thank you for choosing [place] at [facility] to provide your oncology and hematology care.  To afford each patient quality time with our provider, please arrive at least 15 minutes before your scheduled appointment time.    You need to re-schedule your appointment should you arrive 10 or more minutes late.  We strive to give you quality time with our providers, and arriving late affects you and other patients whose appointments are after yours.  Also, if you no show three or more times for appointments you may be dismissed from the clinic at the providers discretion.     Again, thank you for choosing [facility].  Our hope is that these requests will decrease the amount of time that you wait before being seen by our physicians.       _____________________________________________________________  Should you have questions after your visit to [facility], please contact our office at [phone] between the hours of 8:30 a.m. and 4:30 p.m.  Voicemails left after 4:30 p.m. will not be returned until the following business day.  For prescription refill requests, have your pharmacy contact our office.

## 2015-10-17 NOTE — Progress Notes (Signed)
..  Melissa [name]'s reason for visit today are for labs as scheduled per MD orders.  Venipuncture performed with a 23 gauge butterfly needle to L Antecubital.  Brittnae L Twaddle tolerated venipuncture well and without incident; questions were answered and patient was discharged.

## 2015-10-18 LAB — PROTEIN ELECTROPHORESIS, SERUM
A/G RATIO SPE: 0.8 (ref 0.7–1.7)
ALBUMIN ELP: 2.8 g/dL — AB (ref 2.9–4.4)
Alpha-1-Globulin: 0.2 g/dL (ref 0.0–0.4)
Alpha-2-Globulin: 0.7 g/dL (ref 0.4–1.0)
Beta Globulin: 0.9 g/dL (ref 0.7–1.3)
GLOBULIN, TOTAL: 3.7 g/dL (ref 2.2–3.9)
Gamma Globulin: 2 g/dL — ABNORMAL HIGH (ref 0.4–1.8)
M-Spike, %: 1.7 g/dL — ABNORMAL HIGH
TOTAL PROTEIN ELP: 6.5 g/dL (ref 6.0–8.5)

## 2015-10-18 LAB — IMMUNOFIXATION ELECTROPHORESIS
IgA: 138 mg/dL (ref 87–352)
IgG (Immunoglobin G), Serum: 2408 mg/dL — ABNORMAL HIGH (ref 700–1600)
IgM, Serum: 40 mg/dL (ref 26–217)
Total Protein ELP: 6.5 g/dL (ref 6.0–8.5)

## 2015-10-18 LAB — IGG, IGA, IGM
IGG (IMMUNOGLOBIN G), SERUM: 2325 mg/dL — AB (ref 700–1600)
IgA: 145 mg/dL (ref 87–352)
IgM, Serum: 41 mg/dL (ref 26–217)

## 2015-10-19 DIAGNOSIS — J690 Pneumonitis due to inhalation of food and vomit: Secondary | ICD-10-CM | POA: Diagnosis not present

## 2015-10-19 DIAGNOSIS — G40909 Epilepsy, unspecified, not intractable, without status epilepticus: Secondary | ICD-10-CM | POA: Diagnosis not present

## 2015-10-19 DIAGNOSIS — R627 Adult failure to thrive: Secondary | ICD-10-CM | POA: Diagnosis not present

## 2015-10-19 DIAGNOSIS — I129 Hypertensive chronic kidney disease with stage 1 through stage 4 chronic kidney disease, or unspecified chronic kidney disease: Secondary | ICD-10-CM | POA: Diagnosis not present

## 2015-10-19 DIAGNOSIS — I69320 Aphasia following cerebral infarction: Secondary | ICD-10-CM | POA: Diagnosis not present

## 2015-10-19 DIAGNOSIS — E039 Hypothyroidism, unspecified: Secondary | ICD-10-CM | POA: Diagnosis not present

## 2015-10-19 DIAGNOSIS — Z431 Encounter for attention to gastrostomy: Secondary | ICD-10-CM | POA: Diagnosis not present

## 2015-10-19 DIAGNOSIS — I69351 Hemiplegia and hemiparesis following cerebral infarction affecting right dominant side: Secondary | ICD-10-CM | POA: Diagnosis not present

## 2015-10-19 DIAGNOSIS — N183 Chronic kidney disease, stage 3 (moderate): Secondary | ICD-10-CM | POA: Diagnosis not present

## 2015-10-21 DIAGNOSIS — Z09 Encounter for follow-up examination after completed treatment for conditions other than malignant neoplasm: Secondary | ICD-10-CM | POA: Insufficient documentation

## 2015-10-21 NOTE — Assessment & Plan Note (Signed)
Appears more positively interactive during visit and daughters report same at home, continue current med amnagement

## 2015-10-21 NOTE — Assessment & Plan Note (Signed)
Very infrequent, mainly when being driven, antivert restricted only for as needed use

## 2015-10-21 NOTE — Assessment & Plan Note (Signed)
Hospitalized for 10/12 to 10/16 for recurrent pneumonia, had been hospitalized in Sept , 2016 for same. Pt is PEG dependent. No fever , nausea or vomit noted since d/c , improving at home, has completed out pt antibiotic course d/c on and has remained afebrile

## 2015-10-21 NOTE — Assessment & Plan Note (Signed)
Controlled on medications , and managed by neurology, prior concern re s/e of meds prescribed and possible intolerance are abated

## 2015-10-21 NOTE — Progress Notes (Signed)
Subjective:    Patient ID: Melissa [name], female    DOB: Aug 16, 1946, 69 y.o.   MRN: [id number]  HPI Pt who is aphasic, is in with her 2 daughters , who are her caregivers, for hospital follow up for recent re admission for pneumonia.Admitted from 10/12 to 10/08/2015, and had been hospitalized in Sept 2016 for the same reason Family feels good about her current condition, she is more responsive, has improved pain control, and is tolerating tube feeds well with no abdominal distension  Or emesis, her bowels are moving regulalrly, and thet deny any malodorous urine. Family denies any skin breakdown. She has an upcoming appointment with oncology re her multiple myeloma, and they specifically asked about a mammogram for her , which is not indicated before 2017. She is unable to stand , and will therefore be unable to have a mammogram , may have an [place], however , that is if a radiologist agrees on this. I will offer a digital exam at the next visit and hope that Melissa [name] is  Satisfactory to family.   This is a clear statement that Ms. Klingbeil' daughters want her management to continue as though she were in perfect health , despite the limitations which she has following a life changing stroke which has left her hemiplegic, aphasic and PEG dependent.   Review of Systems See HPI  History for caregivers ROS: Denies fever or chills RESP: denies cough  Or wheeze, denies nasal drainage CVS: Denies leg swelling   GI; regualr BM, no loose stool or constipation.vs  Objective:   Physical Exam BP 124/76 mmHg  Pulse 70  Resp 18  SpO2 99%   Patient alert  and in no cardiopulmonary distress.  HEENT: No facial asymmetry, EOMI,   oropharynx pink and moist.  Neck decreased ROM no JVD, no mass.  Chest: Clear to auscultation bilaterally.NO crackles or wheezes  CVS: S1, S2 no murmurs, no S3.Regular rate.  ABD: Soft non tender.   Ext: No edema  MS: Decreased  ROM spine, shoulders, hips and  knees.Wheelchair depenedent  Skin: Intact, no ulcerations or rash noted.  Psych: Blunted l affect. ct not anxious or depressed appearing.  CNS: right hemiplegia and aphasic      Assessment & Plan:  Aspiration pneumonia [facility]) Hospitalized for 10/12 to 10/16 for recurrent pneumonia, had been hospitalized in Sept , 2016 for same. Pt is PEG dependent. No fever , nausea or vomit noted since d/c , improving at home, has completed out pt antibiotic course d/c on and has remained afebrile  Hospital discharge follow-up Hospitalized 10/12 to 10/16 for aspiration pneumonia , also treated for pulmonary fibrosis, and d/c on short course of prednisone, new additional med for hypertension hydralazine at d/c also. Has completed antibiotic course and steroids , stable and improving as far as pulmonary status is concerned BP currently well controlled,no  med adjustment made at visit  Seizure disorder Controlled on medications , and managed by neurology, prior concern re s/e of meds prescribed and possible intolerance are abated  Right elbow pain Well controlled with cymbalta, which also is helping mood,hydrocodone discontinued, had not been using in recent times per report of her daughters  VERTIGO, INTERMITTENT Very infrequent, mainly when being driven, antivert restricted only for as needed use  Depression Appears more positively interactive during visit and daughters report same at home, continue current med amnagement  PEG (percutaneous endoscopic gastrostomy) status (HCC) Chronic PEG feeds, no obstruction or difficulty with feeds, no abdominal bloating ,  no inflammation or breakdown of surrounding skin  Pulmonary fibrosis (HCC) Lungs clear at visit, no crackles heard, had been d/c home on 3 days of prednisone, will not resume same at this tiime since doing well

## 2015-10-21 NOTE — Assessment & Plan Note (Addendum)
Hospitalized 10/12 to 10/16 for aspiration pneumonia , also treated for pulmonary fibrosis, and d/c on short course of prednisone, new additional med for hypertension hydralazine at d/c also. Has completed antibiotic course and steroids , stable and improving as far as pulmonary status is concerned BP currently well controlled,no  med adjustment made at visit

## 2015-10-21 NOTE — Assessment & Plan Note (Signed)
Chronic PEG feeds, no obstruction or difficulty with feeds, no abdominal bloating , no inflammation or breakdown of surrounding skin

## 2015-10-21 NOTE — Assessment & Plan Note (Signed)
Well controlled with cymbalta, which also is helping mood,hydrocodone discontinued, had not been using in recent times per report of her daughters

## 2015-10-21 NOTE — Assessment & Plan Note (Signed)
Lungs clear at visit, no crackles heard, had been d/c home on 3 days of prednisone, will not resume same at this tiime since doing well

## 2015-10-22 DIAGNOSIS — I63032 Cerebral infarction due to thrombosis of left carotid artery: Secondary | ICD-10-CM | POA: Diagnosis not present

## 2015-10-22 DIAGNOSIS — I6900 Unspecified sequelae of nontraumatic subarachnoid hemorrhage: Secondary | ICD-10-CM | POA: Diagnosis not present

## 2015-10-23 DIAGNOSIS — N183 Chronic kidney disease, stage 3 (moderate): Secondary | ICD-10-CM | POA: Diagnosis not present

## 2015-10-23 DIAGNOSIS — E039 Hypothyroidism, unspecified: Secondary | ICD-10-CM | POA: Diagnosis not present

## 2015-10-23 DIAGNOSIS — R627 Adult failure to thrive: Secondary | ICD-10-CM | POA: Diagnosis not present

## 2015-10-23 DIAGNOSIS — I69351 Hemiplegia and hemiparesis following cerebral infarction affecting right dominant side: Secondary | ICD-10-CM | POA: Diagnosis not present

## 2015-10-23 DIAGNOSIS — I69320 Aphasia following cerebral infarction: Secondary | ICD-10-CM | POA: Diagnosis not present

## 2015-10-23 DIAGNOSIS — Z431 Encounter for attention to gastrostomy: Secondary | ICD-10-CM | POA: Diagnosis not present

## 2015-10-23 DIAGNOSIS — J690 Pneumonitis due to inhalation of food and vomit: Secondary | ICD-10-CM | POA: Diagnosis not present

## 2015-10-23 DIAGNOSIS — G40909 Epilepsy, unspecified, not intractable, without status epilepticus: Secondary | ICD-10-CM | POA: Diagnosis not present

## 2015-10-23 DIAGNOSIS — I129 Hypertensive chronic kidney disease with stage 1 through stage 4 chronic kidney disease, or unspecified chronic kidney disease: Secondary | ICD-10-CM | POA: Diagnosis not present

## 2015-10-24 ENCOUNTER — Other Ambulatory Visit: Payer: Self-pay | Admitting: Family Medicine

## 2015-10-24 ENCOUNTER — Other Ambulatory Visit: Payer: Self-pay

## 2015-10-24 ENCOUNTER — Telehealth: Payer: Self-pay

## 2015-10-24 DIAGNOSIS — Z931 Gastrostomy status: Secondary | ICD-10-CM

## 2015-10-24 MED ORDER — VITAMIN D (ERGOCALCIFEROL) 1.25 MG (50000 UNIT) PO CAPS
50000.0000 [IU] | ORAL_CAPSULE | ORAL | Status: DC
Start: 2015-10-24 — End: 2015-11-29

## 2015-10-24 NOTE — Telephone Encounter (Signed)
pls refer to GI   On call" will need to check switchboard, as I do not see that has a named GI doc, cverify with family , I will sign

## 2015-10-24 NOTE — Telephone Encounter (Signed)
Referral entered for Dr Laural Golden in which patient has seen in past

## 2015-10-24 NOTE — Telephone Encounter (Signed)
Attempted to reach daughter Jonelle Sidle) to see if there is a preference.  Voicemail is not setup for messages.

## 2015-10-24 NOTE — Addendum Note (Signed)
Addended by: Denman George B on: 10/24/2015 04:54 PM   Modules accepted: Orders

## 2015-10-26 DIAGNOSIS — R627 Adult failure to thrive: Secondary | ICD-10-CM | POA: Diagnosis not present

## 2015-10-26 DIAGNOSIS — J690 Pneumonitis due to inhalation of food and vomit: Secondary | ICD-10-CM | POA: Diagnosis not present

## 2015-10-26 DIAGNOSIS — I129 Hypertensive chronic kidney disease with stage 1 through stage 4 chronic kidney disease, or unspecified chronic kidney disease: Secondary | ICD-10-CM | POA: Diagnosis not present

## 2015-10-26 DIAGNOSIS — Z431 Encounter for attention to gastrostomy: Secondary | ICD-10-CM | POA: Diagnosis not present

## 2015-10-26 DIAGNOSIS — G40909 Epilepsy, unspecified, not intractable, without status epilepticus: Secondary | ICD-10-CM | POA: Diagnosis not present

## 2015-10-26 DIAGNOSIS — I69320 Aphasia following cerebral infarction: Secondary | ICD-10-CM | POA: Diagnosis not present

## 2015-10-26 DIAGNOSIS — E039 Hypothyroidism, unspecified: Secondary | ICD-10-CM | POA: Diagnosis not present

## 2015-10-26 DIAGNOSIS — I69351 Hemiplegia and hemiparesis following cerebral infarction affecting right dominant side: Secondary | ICD-10-CM | POA: Diagnosis not present

## 2015-10-26 DIAGNOSIS — N183 Chronic kidney disease, stage 3 (moderate): Secondary | ICD-10-CM | POA: Diagnosis not present

## 2015-10-31 ENCOUNTER — Telehealth: Payer: Self-pay | Admitting: Family Medicine

## 2015-10-31 DIAGNOSIS — N183 Chronic kidney disease, stage 3 (moderate): Secondary | ICD-10-CM | POA: Diagnosis not present

## 2015-10-31 DIAGNOSIS — I69959 Hemiplegia and hemiparesis following unspecified cerebrovascular disease affecting unspecified side: Secondary | ICD-10-CM | POA: Diagnosis not present

## 2015-10-31 NOTE — Telephone Encounter (Signed)
Please call Yolanda reg: Ms. Pfannenstiel

## 2015-11-01 NOTE — Telephone Encounter (Signed)
Spoke with daughter re concerns on Vit D dose.   Prescription Vit D 50000 is too expensive.  They will start Vit D 1000 daily

## 2015-11-06 ENCOUNTER — Telehealth: Payer: Self-pay | Admitting: Family Medicine

## 2015-11-06 ENCOUNTER — Other Ambulatory Visit: Payer: Self-pay

## 2015-11-06 MED ORDER — HYDRALAZINE HCL 25 MG PO TABS: 25.0000 mg | ORAL_TABLET | Freq: Three times a day (TID) | ORAL | Status: AC

## 2015-11-06 NOTE — Telephone Encounter (Signed)
Please refill hydrALAZINE (APRESOLINE) 25 MG tablet at the [facility] in [place], Melissa [name] was asking about the GI Appt about replacing the tube/[place] # [phone]

## 2015-11-06 NOTE — Telephone Encounter (Signed)
Spoke with Dr. Olevia Perches office and Butch Penny will speak with physician about scheduling patient.

## 2015-11-06 NOTE — Telephone Encounter (Signed)
Medication refilled.  Called an left message for referral coordinator Lelon Frohlich) at Dr. Olevia Perches office.

## 2015-11-07 DIAGNOSIS — R1311 Dysphagia, oral phase: Secondary | ICD-10-CM | POA: Diagnosis not present

## 2015-11-07 DIAGNOSIS — M6281 Muscle weakness (generalized): Secondary | ICD-10-CM | POA: Diagnosis not present

## 2015-11-07 DIAGNOSIS — I6900 Unspecified sequelae of nontraumatic subarachnoid hemorrhage: Secondary | ICD-10-CM | POA: Diagnosis not present

## 2015-11-07 DIAGNOSIS — N183 Chronic kidney disease, stage 3 (moderate): Secondary | ICD-10-CM | POA: Diagnosis not present

## 2015-11-07 DIAGNOSIS — I63032 Cerebral infarction due to thrombosis of left carotid artery: Secondary | ICD-10-CM | POA: Diagnosis not present

## 2015-11-07 NOTE — Telephone Encounter (Signed)
Yes. I will sign order.

## 2015-11-07 NOTE — Telephone Encounter (Signed)
Noted  

## 2015-11-07 NOTE — Telephone Encounter (Signed)
Spoke with daughter and she states that there is a removable part to patient's gtube that has the tear.   They are just in need of a replacement for this.   Will contact [facility] to see if they are able to get this part.  She would still like for patient to see GI but they have not received an appointment yet.    Will you sign order for part if [facility] is able to provide?   Nurse from [facility] will put on.

## 2015-11-08 DIAGNOSIS — Z431 Encounter for attention to gastrostomy: Secondary | ICD-10-CM | POA: Diagnosis not present

## 2015-11-08 DIAGNOSIS — I69320 Aphasia following cerebral infarction: Secondary | ICD-10-CM | POA: Diagnosis not present

## 2015-11-08 DIAGNOSIS — R627 Adult failure to thrive: Secondary | ICD-10-CM | POA: Diagnosis not present

## 2015-11-08 DIAGNOSIS — J690 Pneumonitis due to inhalation of food and vomit: Secondary | ICD-10-CM | POA: Diagnosis not present

## 2015-11-08 DIAGNOSIS — N183 Chronic kidney disease, stage 3 (moderate): Secondary | ICD-10-CM | POA: Diagnosis not present

## 2015-11-08 DIAGNOSIS — I129 Hypertensive chronic kidney disease with stage 1 through stage 4 chronic kidney disease, or unspecified chronic kidney disease: Secondary | ICD-10-CM | POA: Diagnosis not present

## 2015-11-08 DIAGNOSIS — I69351 Hemiplegia and hemiparesis following cerebral infarction affecting right dominant side: Secondary | ICD-10-CM | POA: Diagnosis not present

## 2015-11-08 DIAGNOSIS — E039 Hypothyroidism, unspecified: Secondary | ICD-10-CM | POA: Diagnosis not present

## 2015-11-08 DIAGNOSIS — G40909 Epilepsy, unspecified, not intractable, without status epilepticus: Secondary | ICD-10-CM | POA: Diagnosis not present

## 2015-11-22 DIAGNOSIS — I6900 Unspecified sequelae of nontraumatic subarachnoid hemorrhage: Secondary | ICD-10-CM | POA: Diagnosis not present

## 2015-11-22 DIAGNOSIS — I63032 Cerebral infarction due to thrombosis of left carotid artery: Secondary | ICD-10-CM | POA: Diagnosis not present

## 2015-11-27 ENCOUNTER — Telehealth: Payer: Self-pay

## 2015-11-27 NOTE — Telephone Encounter (Addendum)
Yes OK to use robitussin, I recommend against her swallowing as she ahs been hospitalized for aspiration pneumonia at least twice in the past 4 months  Try and get them to understand the risk of oral intake , let me know if still want the study, but my professional opinion is tat test not indicated as she is at high risk for aspiration, which can be fatal

## 2015-11-28 ENCOUNTER — Telehealth: Payer: Self-pay

## 2015-11-28 NOTE — Telephone Encounter (Signed)
Advise that script sent, and I assume that following my stated concern they have decided against attempting oral feeding, let me know if otherwise pls

## 2015-11-28 NOTE — Telephone Encounter (Signed)
Called and spoke with daughter and she will talk with siblings first and call back.

## 2015-11-28 NOTE — Telephone Encounter (Signed)
Family has decided that they do not want ST consult.

## 2015-11-29 ENCOUNTER — Telehealth: Payer: Self-pay | Admitting: Family Medicine

## 2015-11-29 ENCOUNTER — Emergency Department (HOSPITAL_COMMUNITY)
Admission: EM | Admit: 2015-11-29 | Discharge: 2015-11-30 | Disposition: A | Discharge status: Home / Self Care | Payer: Medicare Other | Attending: Emergency Medicine | Admitting: Emergency Medicine

## 2015-11-29 ENCOUNTER — Encounter (HOSPITAL_COMMUNITY): Payer: Self-pay | Admitting: Emergency Medicine

## 2015-11-29 ENCOUNTER — Emergency Department (HOSPITAL_COMMUNITY): Payer: Medicare Other

## 2015-11-29 DIAGNOSIS — Z8673 Personal history of transient ischemic attack (TIA), and cerebral infarction without residual deficits: Secondary | ICD-10-CM | POA: Insufficient documentation

## 2015-11-29 DIAGNOSIS — Z872 Personal history of diseases of the skin and subcutaneous tissue: Secondary | ICD-10-CM | POA: Insufficient documentation

## 2015-11-29 DIAGNOSIS — E669 Obesity, unspecified: Secondary | ICD-10-CM | POA: Insufficient documentation

## 2015-11-29 DIAGNOSIS — E785 Hyperlipidemia, unspecified: Secondary | ICD-10-CM | POA: Diagnosis not present

## 2015-11-29 DIAGNOSIS — N183 Chronic kidney disease, stage 3 (moderate): Secondary | ICD-10-CM | POA: Diagnosis not present

## 2015-11-29 DIAGNOSIS — I63032 Cerebral infarction due to thrombosis of left carotid artery: Secondary | ICD-10-CM | POA: Diagnosis not present

## 2015-11-29 DIAGNOSIS — R402431 Glasgow coma scale score 3-8, in the field [EMT or ambulance]: Secondary | ICD-10-CM | POA: Diagnosis not present

## 2015-11-29 DIAGNOSIS — E039 Hypothyroidism, unspecified: Secondary | ICD-10-CM | POA: Insufficient documentation

## 2015-11-29 DIAGNOSIS — R509 Fever, unspecified: Secondary | ICD-10-CM | POA: Diagnosis not present

## 2015-11-29 DIAGNOSIS — I10 Essential (primary) hypertension: Secondary | ICD-10-CM | POA: Diagnosis not present

## 2015-11-29 DIAGNOSIS — Z8579 Personal history of other malignant neoplasms of lymphoid, hematopoietic and related tissues: Secondary | ICD-10-CM | POA: Diagnosis not present

## 2015-11-29 DIAGNOSIS — Z79899 Other long term (current) drug therapy: Secondary | ICD-10-CM | POA: Diagnosis not present

## 2015-11-29 DIAGNOSIS — Z85528 Personal history of other malignant neoplasm of kidney: Secondary | ICD-10-CM | POA: Diagnosis not present

## 2015-11-29 DIAGNOSIS — J690 Pneumonitis due to inhalation of food and vomit: Secondary | ICD-10-CM | POA: Insufficient documentation

## 2015-11-29 DIAGNOSIS — M6281 Muscle weakness (generalized): Secondary | ICD-10-CM | POA: Diagnosis not present

## 2015-11-29 DIAGNOSIS — J439 Emphysema, unspecified: Secondary | ICD-10-CM | POA: Diagnosis not present

## 2015-11-29 DIAGNOSIS — Z7902 Long term (current) use of antithrombotics/antiplatelets: Secondary | ICD-10-CM | POA: Diagnosis not present

## 2015-11-29 DIAGNOSIS — R1311 Dysphagia, oral phase: Secondary | ICD-10-CM | POA: Diagnosis not present

## 2015-11-29 DIAGNOSIS — J189 Pneumonia, unspecified organism: Secondary | ICD-10-CM | POA: Diagnosis not present

## 2015-11-29 DIAGNOSIS — R531 Weakness: Secondary | ICD-10-CM | POA: Diagnosis not present

## 2015-11-29 LAB — URINE MICROSCOPIC-ADD ON: RBC / HPF: NONE SEEN RBC/hpf (ref 0–5)

## 2015-11-29 LAB — URINALYSIS, ROUTINE W REFLEX MICROSCOPIC
Bilirubin Urine: NEGATIVE
GLUCOSE, UA: NEGATIVE mg/dL
HGB URINE DIPSTICK: NEGATIVE
Ketones, ur: NEGATIVE mg/dL
Leukocytes, UA: NEGATIVE
Nitrite: NEGATIVE
PROTEIN: 100 mg/dL — AB
Specific Gravity, Urine: 1.02 (ref 1.005–1.030)
pH: 5.5 (ref 5.0–8.0)

## 2015-11-29 LAB — BASIC METABOLIC PANEL
Anion gap: 5 (ref 5–15)
BUN: 23 mg/dL — AB (ref 6–20)
CALCIUM: 9.4 mg/dL (ref 8.9–10.3)
CO2: 29 mmol/L (ref 22–32)
Chloride: 103 mmol/L (ref 101–111)
Creatinine, Ser: 0.78 mg/dL (ref 0.44–1.00)
GFR calc Af Amer: >60 mL/min (ref >60)
GLUCOSE: 90 mg/dL (ref 65–99)
Potassium: 4.1 mmol/L (ref 3.5–5.1)
Sodium: 137 mmol/L (ref 135–145)

## 2015-11-29 LAB — CBC WITH DIFFERENTIAL/PLATELET
BASOS ABS: 0 10*3/uL (ref 0.0–0.1)
BASOS PCT: 0 %
EOS ABS: 0 10*3/uL (ref 0.0–0.7)
EOS PCT: 1 %
HCT: 33.2 % — ABNORMAL LOW (ref 36.0–46.0)
Hemoglobin: 11.7 g/dL — ABNORMAL LOW (ref 12.0–15.0)
Lymphocytes Relative: 30 %
Lymphs Abs: 2.1 10*3/uL (ref 0.7–4.0)
MCH: 33.2 pg (ref 26.0–34.0)
MCHC: 35.2 g/dL (ref 30.0–36.0)
MCV: 94.3 fL (ref 78.0–100.0)
MONO ABS: 0.9 10*3/uL (ref 0.1–1.0)
MONOS PCT: 13 %
Neutro Abs: 3.9 10*3/uL (ref 1.7–7.7)
Neutrophils Relative %: 56 %
PLATELETS: 209 10*3/uL (ref 150–400)
RBC: 3.52 MIL/uL — ABNORMAL LOW (ref 3.87–5.11)
RDW: 15.2 % (ref 11.5–15.5)
WBC: 6.9 10*3/uL (ref 4.0–10.5)

## 2015-11-29 LAB — LACTIC ACID, PLASMA: Lactic Acid, Venous: 0.8 mmol/L (ref 0.5–2.0)

## 2015-11-29 LAB — BRAIN NATRIURETIC PEPTIDE: B Natriuretic Peptide: 816 pg/mL — ABNORMAL HIGH (ref 0.0–100.0)

## 2015-11-29 MED ORDER — AMPICILLIN-SULBACTAM SODIUM 3 (2-1) G IV SOLR
3.0000 g | Freq: Once | INTRAVENOUS | Status: AC
  Administered 2015-11-30: 3 g via INTRAMUSCULAR
  Filled 2015-11-29: qty 3

## 2015-11-29 MED ORDER — METRONIDAZOLE 500 MG PO TABS
500.0000 mg | ORAL_TABLET | Freq: Once | ORAL | Status: AC
  Administered 2015-11-29: 500 mg
  Filled 2015-11-29: qty 1

## 2015-11-29 MED ORDER — SODIUM CHLORIDE 0.9 % IV BOLUS (SEPSIS): 1000.0000 mL | Freq: Once | INTRAVENOUS | Status: DC

## 2015-11-29 MED ORDER — METRONIDAZOLE 500 MG PO TABS: 500.0000 mg | ORAL_TABLET | Freq: Two times a day (BID) | ORAL | Status: AC

## 2015-11-29 MED ORDER — METRONIDAZOLE 50 MG/ML ORAL SUSPENSION
500.0000 mg | Freq: Once | ORAL | Status: DC
  Filled 2015-11-29: qty 10

## 2015-11-29 MED ORDER — AMOXICILLIN-POT CLAVULANATE 250-62.5 MG/5ML PO SUSR
500.0000 mg | Freq: Once | ORAL | Status: DC
  Filled 2015-11-29: qty 10

## 2015-11-29 MED ORDER — LIDOCAINE HCL (PF) 2 % IJ SOLN
INTRAMUSCULAR | Status: AC
  Administered 2015-11-29: 10 mL
  Filled 2015-11-29: qty 10

## 2015-11-29 MED ORDER — AMPICILLIN-SULBACTAM SODIUM 1.5 (1-0.5) G IJ SOLR
INTRAMUSCULAR | Status: AC
  Filled 2015-11-29: qty 3

## 2015-11-29 MED ORDER — AMOXICILLIN-POT CLAVULANATE 875-125 MG PO TABS: 1.0000 | ORAL_TABLET | Freq: Two times a day (BID) | ORAL | Status: AC

## 2015-11-29 NOTE — ED Notes (Signed)
Pt has been running low grade temp per family and pts neb machine quit working yesterday.

## 2015-11-29 NOTE — Telephone Encounter (Signed)
Called and left message advising that patient go to the ED.

## 2015-11-29 NOTE — ED Provider Notes (Signed)
CSN: [id number]     Arrival date & time 11/29/15  1918 History  By signing my name below, I, Soijett Blue, attest that this documentation has been prepared under the direction and in the presence of Varney Biles, MD. Electronically Signed: Soijett Blue, ED Scribe. 11/29/2015. 8:24 PM.   Chief Complaint  Patient presents with  . Fever     The history is provided by the patient. No language interpreter was used.    Melissa [name] is a 69 y.o. female with a medical hx of stroke, peg tube placement, hx of pneumonia, who presents to the Emergency Department complaining of max fever of 100.6 onset 2 days. 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 Pt daughter notes that the pt is not cleared to swallow as of yet. Pt is unable to talk following her stroke. Pt is having associated symptoms of productive cough. She notes that she has tried robitussin and tylenol with no relief of her symptoms. She denies abdominal pain, diarrhea, vomiting, and any other symptoms. Pt daughter notes that the pt wears diapers. Family notes that the pt has been around sick contacts of family and that the pt got her flu shot in October. Denies PMHx of DM at this time.  Per pt chart review pt called her PCP several times over the past couple of days asking about her fever and requesting a new script for her neb machine that was broken. Pt was provided a new script for her neb machine as well as informed to come to the ED today for further evaluation of a fever over 100.6.   Past Medical History  Diagnosis Date  . Vertigo, intermittent   . Hypothyroidism   . Hyperlipidemia   . Obesity   . Hypertension     severe and resistant to treatment; 2008-negative left renal angiogram  . Vitiligo   . Degenerative disc disease, lumbar   . Degenerative disc disease, cervical   . Chest pain 2008    2008-normal coronary angiography  . Stroke  ([place]) 09/18/2014    right hemiparesis  . Renal cell carcinoma     Right nephrectomy  . Multiple myeloma ([place]) 08/26/2014  . History of pneumonia   . Emphysema of lung ([place])   . CVA (cerebral infarction) 06/2015   Past Surgical History  Procedure Laterality Date  . Tubal ligation  1983    Bilateral  . Nephrectomy      Right; secondary to renal cell carcinoma  . Colonoscopy N/A 04/21/2014    Procedure: COLONOSCOPY;  Surgeon: Rogene Houston, MD;  Location: [facility];  Service: Endoscopy;  Laterality: N/A;  . Colonoscopy N/A 04/21/2014    Procedure: COLONOSCOPY;  Surgeon: Rogene Houston, MD;  Location: [facility];  Service: Endoscopy;  Laterality: N/A;  1030  . Tee without cardioversion N/A 09/23/2014    Procedure: TRANSESOPHAGEAL ECHOCARDIOGRAM (TEE);  Surgeon: Sanda Klein, MD;  Location: [facility] ENDOSCOPY;  Service: Cardiovascular;  Laterality: N/A;  . Peg tube placement  09/2014   Family History  Problem Relation Age of Onset  . Heart disease Mother 63  . Hypertension Mother   . Colon cancer Father   . Hypertension Sister     x2  . Diabetes Sister     x2  . Heart failure Sister   . Lupus Brother    Social History  Substance Use  Topics  . Smoking status: Never Smoker   . Smokeless tobacco: Never Used  . Alcohol Use: No   OB History    No data available     Review of Systems  Constitutional: Positive for fever.  Gastrointestinal: Negative for vomiting, abdominal pain and diarrhea.      Allergies  Morphine  Home Medications   Prior to Admission medications   Medication Sig Start Date End Date Taking? Authorizing Provider  acetaminophen (TYLENOL) 650 MG CR tablet 650 mg by Feeding Tube route every 8 (eight) hours as needed for pain.    Yes Historical Provider, MD  albuterol (PROVENTIL) (2.5 MG/3ML) 0.083% nebulizer solution Take 3 mLs (2.5 mg total) by nebulization every 6 (six) hours as needed for wheezing or shortness of breath. Patient taking differently:  Take 2.5 mg by nebulization 2 (two) times daily.  10/08/15  Yes Kathie Dike, MD  amLODipine (NORVASC) 10 MG tablet Place 1 tablet (10 mg total) into feeding tube daily. 09/09/15  Yes Samuella Cota, MD  carvedilol (COREG) 6.25 MG tablet TAKE ONE TABLET INTO FEEDING TUBE TWICE DAILY WITH MEALS 06/29/15  Yes Fayrene Helper, MD  cholecalciferol (VITAMIN D) 1000 UNITS tablet 1,000 Units by Feeding Tube route daily.   Yes Historical Provider, MD  clonazePAM (KLONOPIN) 0.5 MG tablet Place 1 tablet (0.5 mg total) into feeding tube 3 (three) times daily as needed for anxiety. 10/08/15  Yes Kathie Dike, MD  cloNIDine (CATAPRES - DOSED IN MG/24 HR) 0.3 mg/24hr patch Place 1 patch (0.3 mg total) onto the skin once a week. 10/16/15  Yes Fayrene Helper, MD  clopidogrel (PLAVIX) 75 MG tablet Place 1 tablet (75 mg total) into feeding tube daily. 10/16/15  Yes Fayrene Helper, MD  diclofenac sodium (VOLTAREN) 1 % GEL Apply twice daily, as needed to both knees, for pain and swelling Patient taking differently: Apply 4 g topically 2 (two) times daily as needed. Apply twice daily, as needed to both knees, for pain and swelling 10/16/15  Yes Fayrene Helper, MD  DULoxetine (CYMBALTA) 60 MG capsule Take 60 mg by mouth daily. 11/10/15  Yes Historical Provider, MD  hydrALAZINE (APRESOLINE) 25 MG tablet Place 1 tablet (25 mg total) into feeding tube 3 (three) times daily. 11/06/15  Yes Fayrene Helper, MD  Lacosamide 150 MG TABS Place 1 tablet (150 mg total) into feeding tube 2 (two) times daily. 09/09/15  Yes Samuella Cota, MD  levETIRAcetam (KEPPRA) 100 MG/ML solution Place 10 mLs (1,000 mg total) into feeding tube 2 (two) times daily. 09/09/15  Yes Samuella Cota, MD  levothyroxine (SYNTHROID, LEVOTHROID) 75 MCG tablet Take 0.5-1 tablets (37.5-75 mcg total) by mouth daily before breakfast. take one half tablet sat and sun and 1 tab mon- fri 10/08/15  Yes Kathie Dike, MD  lovastatin (MEVACOR)  40 MG tablet Place 1 tablet (40 mg total) into feeding tube at bedtime. 10/16/15  Yes Fayrene Helper, MD  meclizine (ANTIVERT) 25 MG tablet Take 1 tablet (25 mg total) by mouth 3 (three) times daily as needed for dizziness. 10/16/15  Yes Fayrene Helper, MD  Nutritional Supplements (FEEDING SUPPLEMENT, JEVITY 1.5 CAL,) LIQD 237 mLs by Feeding route 5 (five) times daily.   Yes Historical Provider, MD  polyethylene glycol (MIRALAX / GLYCOLAX) packet Take 17 g by mouth daily as needed for mild constipation or moderate constipation.    Yes Historical Provider, MD  potassium chloride SA (K-DUR,KLOR-CON) 20 MEQ tablet Take 1  tablet (20 mEq total) by mouth once. Patient taking differently: Take 20 mEq by mouth daily.  10/17/15  Yes Thomas S Kefalas, PA-C  PROAIR HFA 108 (90 BASE) MCG/ACT inhaler Inhale 1-2 puffs into the lungs every 6 (six) hours as needed for wheezing or shortness of breath.  08/31/15  Yes Historical Provider, MD  prochlorperazine (COMPAZINE) 10 MG tablet 10 mg by Feeding Tube route every 6 (six) hours as needed for nausea or vomiting.    Yes Historical Provider, MD  ranitidine (ZANTAC) 150 MG/10ML syrup Place 10 mLs (150 mg total) into feeding tube daily. 10/16/15  Yes Fayrene Helper, MD  amoxicillin-clavulanate (AUGMENTIN) 875-125 MG tablet Place 1 tablet into feeding tube 2 (two) times daily. 11/29/15   Varney Biles, MD  metroNIDAZOLE (FLAGYL) 500 MG tablet Place 1 tablet (500 mg total) into feeding tube 2 (two) times daily. 11/29/15   Varney Biles, MD  Water For Irrigation, Sterile (FREE WATER) SOLN Place 100 mLs into feeding tube every 4 (four) hours. 10/01/14   Ripudeep K Rai, MD   BP 124/94 mmHg  Pulse 86  Temp(Src) 99.7 F (37.6 C) (Rectal)  Resp 14  Ht $R'5\' 6"'lo$  (1.676 m)  Wt 180 lb (81.647 kg)  BMI 29.07 kg/m2  SpO2 99% Physical Exam  Constitutional: She is oriented to person, place, and time. She appears well-developed and well-nourished. No distress.  HENT:   Head: Normocephalic and atraumatic.  Eyes: EOM are normal.  Neck: Neck supple.  Cardiovascular: Normal rate, regular rhythm and normal heart sounds.  Exam reveals no gallop and no friction rub.   No murmur heard. Pulmonary/Chest: Effort normal. Tachypnea noted. No respiratory distress. She has no wheezes. She has rhonchi. She has no rales.  Mild rhonchi bilaterally Rt worse than Left  Abdominal: Soft. There is no tenderness.  Peg tube insertion site is not showing evidence of infection.   Musculoskeletal: Normal range of motion.  No unilateral leg swelling. No pitting edema.   Neurological: She is alert and oriented to person, place, and time.  Skin: Skin is warm and dry.  Psychiatric: She has a normal mood and affect. Her behavior is normal.  Nursing note and vitals reviewed.   ED Course  Procedures (including critical care time) DIAGNOSTIC STUDIES: Oxygen Saturation is 99% on RA, nl by my interpretation.    COORDINATION OF CARE: 8:15 PM Discussed treatment plan with pt at bedside which includes EKG and pt agreed to plan.    Labs Review Labs Reviewed  CBC WITH DIFFERENTIAL/PLATELET - Abnormal; Notable for the following:    RBC 3.52 (*)    Hemoglobin 11.7 (*)    HCT 33.2 (*)    All other components within normal limits  BASIC METABOLIC PANEL - Abnormal; Notable for the following:    BUN 23 (*)    All other components within normal limits  URINALYSIS, ROUTINE W REFLEX MICROSCOPIC (NOT AT [facility]) - Abnormal; Notable for the following:    Protein, ur 100 (*)    All other components within normal limits  BRAIN NATRIURETIC PEPTIDE - Abnormal; Notable for the following:    B Natriuretic Peptide 816.0 (*)    All other components within normal limits  URINE MICROSCOPIC-ADD ON - Abnormal; Notable for the following:    Squamous Epithelial / LPF 0-5 (*)    Bacteria, UA FEW (*)    All other components within normal limits  URINE CULTURE  LACTIC ACID, PLASMA    Imaging  Review Dg Chest 2  View  11/29/2015  CLINICAL DATA:  Acute onset of fever and congestion. Assess for aspiration. Initial encounter. EXAM: CHEST  2 VIEW COMPARISON:  Chest radiograph performed 10/04/2015 FINDINGS: The lungs are mildly hypoexpanded. Bibasilar airspace opacities, right greater than left, raise suspicion for aspiration pneumonia given their appearance. There is no evidence of pleural effusion or pneumothorax. The heart is mildly enlarged. No acute osseous abnormalities are seen. IMPRESSION: Lungs mildly hypoexpanded. Bibasilar airspace opacities, right greater than left, are suspicious for aspiration pneumonia given their appearance. Mild cardiomegaly noted. These results were called by telephone at the time of interpretation on 11/29/2015 at 9:43 pm to Dr. Varney Biles, who verbally acknowledged these results. Electronically Signed   By: Garald Balding M.D.   On: 11/29/2015 21:43   I have personally reviewed and evaluated these images as part of my medical decision-making.   EKG Interpretation   Date/Time:  Wednesday November 29 2015 19:26:35 EST Ventricular Rate:  80 PR Interval:  154 QRS Duration: 102 QT Interval:  394 QTC Calculation: 454 R Axis:   85 Text Interpretation:  Sinus rhythm Multiple ventricular premature  complexes Probable left atrial enlargement Borderline right axis deviation  Left ventricular hypertrophy No significant change since last tracing  Confirmed by Siren Porrata, MD, Thelma Comp [phone]) on 11/29/2015 8:05:01 PM      MDM   Final diagnoses:  Aspiration pneumonia due to regurgitated food, unspecified laterality, unspecified part of lung ([place])    I personally performed the services described in this documentation, which was scribed in my presence. The recorded information has been reviewed and is accurate.  Pt comes in with cc of cough and fevers. Pt has had low grade fever at home with cough. Pt has hx of strokes and has a PEG tube in place. She has hx of  aspiration pneumonia - and family is concerned that she might have ended up with aspiration pneumonia again. Exam does indicate some rhonchi and the CXR confirms what appears to be aspiration pneumonia. Although this could be simply aspiration penumonitis - it is best that we cover her with antibiotics to prevent empyemas.   Varney Biles, MD 11/30/15 0001

## 2015-11-29 NOTE — Discharge Instructions (Signed)
Melissa [name] has aspiration pneumonia. Give her the meds prescribed. Have her see her doctor in 7-10 days. Return to the ER if there is difficulty in breathing, confusion, inability to keep the meds down.   Aspiration Pneumonia Aspiration pneumonia is an infection in your lungs. It occurs when food, liquid, or stomach contents (vomit) are inhaled (aspirated) into your lungs. When these things get into your lungs, swelling (inflammation) and infection can occur. This can make it difficult for you to breathe. Aspiration pneumonia is a serious condition and can be life threatening. RISK FACTORS Aspiration pneumonia is more likely to occur when a person's cough (gag) reflex or ability to swallow has been decreased. Some things that can do this include:   Having a brain injury or disease, such as stroke, seizures, Parkinson's disease, dementia, or amyotrophic lateral sclerosis (ALS).   Being given general anesthetic for procedures.   Being in a coma (unconscious).   Having a narrowing of the tube that carries food to the stomach (esophagus).   Drinking too much alcohol. If a person passes out and vomits, vomit can be swallowed into the lungs.   Taking certain medicines, such as tranquilizers or sedatives.  SIGNS AND SYMPTOMS   Coughing after swallowing food or liquids.   Breathing problems, such as wheezing or shortness of breath.   Bluish skin. This can be caused by lack of oxygen.   Coughing up food or mucus. The mucus might contain blood, greenish material, or yellowish-white fluid (pus).   Fever.   Chest pain.   Being more tired than usual (fatigue).   Sweating more than usual.   Bad breath.  DIAGNOSIS  A physical exam will be done. During the exam, the health care provider will listen to your lungs with a stethoscope to check for:   Crackling sounds in the lungs.  Decreased breath sounds.  A rapid heartbeat. Various tests may be ordered. These may  include:   Chest X-ray.   CT scan.   Swallowing study. This test looks at how food is swallowed and whether it goes into your breathing tube (trachea) or food pipe (esophagus).   Sputum culture. Saliva and mucus (sputum) are collected from the lungs or the tubes that carry air to the lungs (bronchi). The sputum is then tested for bacteria.   Bronchoscopy. This test uses a flexible tube (bronchoscope) to see inside the lungs. TREATMENT  Treatment will usually include antibiotic medicines. Other medicines may also be used to reduce fever or pain. You may need to be treated in the hospital. In the hospital, your breathing will be carefully monitored. Depending on how well you are breathing, you may need to be given oxygen, or you may need breathing support from a breathing machine (ventilator). For people who fail a swallowing study, a feeding tube might be placed in the stomach, or they may be asked to avoid certain food textures or liquids when they eat. HOME CARE INSTRUCTIONS   Carefully follow any special eating instructions you were given, such as avoiding certain food textures or thickening liquids. This reduces the risk of developing aspiration pneumonia again.  Only take over-the-counter or prescription medicines as directed by your health care provider. Follow the directions carefully.   If you were prescribed antibiotics, take them as directed. Finish them even if you start to feel better.   Rest as instructed by your health care provider.   Keep all follow-up appointments with your health care provider.  SEEK MEDICAL CARE IF:  You develop worsening shortness of breath, wheezing, or difficulty breathing.   You develop a fever.   You have chest pain.  MAKE SURE YOU:   Understand these instructions.  Will watch your condition.  Will get help right away if you are not doing well or get worse.   This information is not intended to replace advice given to you by  your health care provider. Make sure you discuss any questions you have with your health care provider.   Document Released: 10/06/2009 Document Revised: 12/14/2013 Document Reviewed: 05/27/2013 Elsevier Interactive Patient Education [employer].

## 2015-11-29 NOTE — Telephone Encounter (Signed)
Melissa [name] is stating that Melissa [name] is very congested, they have been giving her Robitussin and Tylenol currently running fever of 100.6. At what point should they call EMS, please advise [name] at [phone]

## 2015-11-30 ENCOUNTER — Other Ambulatory Visit: Payer: Self-pay

## 2015-11-30 ENCOUNTER — Telehealth: Payer: Self-pay

## 2015-11-30 DIAGNOSIS — N183 Chronic kidney disease, stage 3 (moderate): Secondary | ICD-10-CM | POA: Diagnosis not present

## 2015-11-30 DIAGNOSIS — R0602 Shortness of breath: Secondary | ICD-10-CM | POA: Diagnosis not present

## 2015-11-30 DIAGNOSIS — Z7401 Bed confinement status: Secondary | ICD-10-CM | POA: Diagnosis not present

## 2015-11-30 DIAGNOSIS — I69959 Hemiplegia and hemiparesis following unspecified cerebrovascular disease affecting unspecified side: Secondary | ICD-10-CM | POA: Diagnosis not present

## 2015-11-30 MED ORDER — ONDANSETRON HCL 4 MG/5ML PO SOLN: 4.0000 mg | Freq: Three times a day (TID) | ORAL | Status: DC | PRN

## 2015-11-30 MED ORDER — ONDANSETRON HCL 4 MG/5ML PO SOLN: 4.0000 mg | Freq: Three times a day (TID) | ORAL | Status: AC | PRN

## 2015-11-30 NOTE — Telephone Encounter (Signed)
Daughter Denman George made aware of rx.  Called and verified with [facility] that they do have medication available.

## 2015-11-30 NOTE — Telephone Encounter (Signed)
zofran solution ordered, pls verify available  Prior to sending , if not then send zofran 4 mg tab three times daily as needed vis  pEG #20 tabs

## 2015-11-30 NOTE — ED Notes (Signed)
Patient discharged home via [facility]. Discharge instructions and prescriptions reviewed with children. No needs or questions voiced.

## 2015-12-01 LAB — URINE CULTURE: Culture: 7000

## 2015-12-04 ENCOUNTER — Inpatient Hospital Stay (HOSPITAL_COMMUNITY)
Admission: EM | Admit: 2015-12-04 | Discharge: 2015-12-24 | DRG: 178 | Disposition: E | Payer: Medicare Other | Attending: Internal Medicine | Admitting: Internal Medicine

## 2015-12-04 ENCOUNTER — Encounter (HOSPITAL_COMMUNITY): Payer: Self-pay | Admitting: *Deleted

## 2015-12-04 ENCOUNTER — Emergency Department (HOSPITAL_COMMUNITY): Payer: Medicare Other

## 2015-12-04 DIAGNOSIS — I69351 Hemiplegia and hemiparesis following cerebral infarction affecting right dominant side: Secondary | ICD-10-CM

## 2015-12-04 DIAGNOSIS — I69391 Dysphagia following cerebral infarction: Secondary | ICD-10-CM

## 2015-12-04 DIAGNOSIS — G40909 Epilepsy, unspecified, not intractable, without status epilepticus: Secondary | ICD-10-CM | POA: Diagnosis not present

## 2015-12-04 DIAGNOSIS — Z7902 Long term (current) use of antithrombotics/antiplatelets: Secondary | ICD-10-CM | POA: Diagnosis not present

## 2015-12-04 DIAGNOSIS — Z8701 Personal history of pneumonia (recurrent): Secondary | ICD-10-CM | POA: Diagnosis not present

## 2015-12-04 DIAGNOSIS — E039 Hypothyroidism, unspecified: Secondary | ICD-10-CM | POA: Diagnosis present

## 2015-12-04 DIAGNOSIS — Z85528 Personal history of other malignant neoplasm of kidney: Secondary | ICD-10-CM | POA: Diagnosis not present

## 2015-12-04 DIAGNOSIS — Z7401 Bed confinement status: Secondary | ICD-10-CM

## 2015-12-04 DIAGNOSIS — Z800 Family history of malignant neoplasm of digestive organs: Secondary | ICD-10-CM | POA: Diagnosis not present

## 2015-12-04 DIAGNOSIS — Z66 Do not resuscitate: Secondary | ICD-10-CM | POA: Diagnosis present

## 2015-12-04 DIAGNOSIS — C9000 Multiple myeloma not having achieved remission: Secondary | ICD-10-CM | POA: Diagnosis present

## 2015-12-04 DIAGNOSIS — Z833 Family history of diabetes mellitus: Secondary | ICD-10-CM | POA: Diagnosis not present

## 2015-12-04 DIAGNOSIS — I129 Hypertensive chronic kidney disease with stage 1 through stage 4 chronic kidney disease, or unspecified chronic kidney disease: Secondary | ICD-10-CM | POA: Diagnosis present

## 2015-12-04 DIAGNOSIS — R1310 Dysphagia, unspecified: Secondary | ICD-10-CM | POA: Diagnosis present

## 2015-12-04 DIAGNOSIS — Z931 Gastrostomy status: Secondary | ICD-10-CM | POA: Diagnosis not present

## 2015-12-04 DIAGNOSIS — E785 Hyperlipidemia, unspecified: Secondary | ICD-10-CM | POA: Diagnosis not present

## 2015-12-04 DIAGNOSIS — Z7189 Other specified counseling: Secondary | ICD-10-CM | POA: Diagnosis not present

## 2015-12-04 DIAGNOSIS — E669 Obesity, unspecified: Secondary | ICD-10-CM | POA: Diagnosis not present

## 2015-12-04 DIAGNOSIS — Z8249 Family history of ischemic heart disease and other diseases of the circulatory system: Secondary | ICD-10-CM | POA: Diagnosis not present

## 2015-12-04 DIAGNOSIS — N183 Chronic kidney disease, stage 3 unspecified: Secondary | ICD-10-CM | POA: Diagnosis present

## 2015-12-04 DIAGNOSIS — G839 Paralytic syndrome, unspecified: Secondary | ICD-10-CM | POA: Diagnosis not present

## 2015-12-04 DIAGNOSIS — G8191 Hemiplegia, unspecified affecting right dominant side: Secondary | ICD-10-CM

## 2015-12-04 DIAGNOSIS — L8990 Pressure ulcer of unspecified site, unspecified stage: Secondary | ICD-10-CM | POA: Insufficient documentation

## 2015-12-04 DIAGNOSIS — R509 Fever, unspecified: Secondary | ICD-10-CM | POA: Diagnosis not present

## 2015-12-04 DIAGNOSIS — J189 Pneumonia, unspecified organism: Secondary | ICD-10-CM | POA: Diagnosis present

## 2015-12-04 DIAGNOSIS — R803 Bence Jones proteinuria: Secondary | ICD-10-CM | POA: Diagnosis present

## 2015-12-04 DIAGNOSIS — J690 Pneumonitis due to inhalation of food and vomit: Secondary | ICD-10-CM | POA: Insufficient documentation

## 2015-12-04 DIAGNOSIS — I10 Essential (primary) hypertension: Secondary | ICD-10-CM

## 2015-12-04 DIAGNOSIS — R0602 Shortness of breath: Secondary | ICD-10-CM

## 2015-12-04 DIAGNOSIS — Z515 Encounter for palliative care: Secondary | ICD-10-CM | POA: Insufficient documentation

## 2015-12-04 DIAGNOSIS — J8410 Pulmonary fibrosis, unspecified: Secondary | ICD-10-CM | POA: Diagnosis present

## 2015-12-04 DIAGNOSIS — I69320 Aphasia following cerebral infarction: Secondary | ICD-10-CM

## 2015-12-04 DIAGNOSIS — R112 Nausea with vomiting, unspecified: Secondary | ICD-10-CM | POA: Diagnosis not present

## 2015-12-04 LAB — CBC WITH DIFFERENTIAL/PLATELET
Basophils Absolute: 0 10*3/uL (ref 0.0–0.1)
Basophils Relative: 0 %
Eosinophils Absolute: 0 10*3/uL (ref 0.0–0.7)
Eosinophils Relative: 0 %
HCT: 31.9 % — ABNORMAL LOW (ref 36.0–46.0)
Hemoglobin: 11.2 g/dL — ABNORMAL LOW (ref 12.0–15.0)
Lymphocytes Relative: 16 %
Lymphs Abs: 2 10*3/uL (ref 0.7–4.0)
MCH: 32.8 pg (ref 26.0–34.0)
MCHC: 35.1 g/dL (ref 30.0–36.0)
MCV: 93.5 fL (ref 78.0–100.0)
Monocytes Absolute: 0.8 10*3/uL (ref 0.1–1.0)
Monocytes Relative: 7 %
Neutro Abs: 9.6 10*3/uL — ABNORMAL HIGH (ref 1.7–7.7)
Neutrophils Relative %: 77 %
Platelets: 296 10*3/uL (ref 150–400)
RBC: 3.41 MIL/uL — ABNORMAL LOW (ref 3.87–5.11)
RDW: 15.1 % (ref 11.5–15.5)
WBC: 12.5 10*3/uL — ABNORMAL HIGH (ref 4.0–10.5)

## 2015-12-04 LAB — COMPREHENSIVE METABOLIC PANEL
ALT: 18 U/L (ref 14–54)
AST: 19 U/L (ref 15–41)
Albumin: 2.8 g/dL — ABNORMAL LOW (ref 3.5–5.0)
Alkaline Phosphatase: 43 U/L (ref 38–126)
Anion gap: 6 (ref 5–15)
BUN: 16 mg/dL (ref 6–20)
CO2: 28 mmol/L (ref 22–32)
Calcium: 9.2 mg/dL (ref 8.9–10.3)
Chloride: 100 mmol/L — ABNORMAL LOW (ref 101–111)
Creatinine, Ser: 0.76 mg/dL (ref 0.44–1.00)
GFR calc Af Amer: >60 mL/min (ref >60)
GFR calc non Af Amer: >60 mL/min (ref >60)
Glucose, Bld: 107 mg/dL — ABNORMAL HIGH (ref 65–99)
Potassium: 4 mmol/L (ref 3.5–5.1)
Sodium: 134 mmol/L — ABNORMAL LOW (ref 135–145)
Total Bilirubin: 0.4 mg/dL (ref 0.3–1.2)
Total Protein: 7.9 g/dL (ref 6.5–8.1)

## 2015-12-04 MED ORDER — ACETAMINOPHEN 650 MG RE SUPP
650.0000 mg | Freq: Once | RECTAL | Status: AC
  Administered 2015-12-04: 650 mg via RECTAL
  Filled 2015-12-04: qty 1

## 2015-12-04 NOTE — ED Notes (Signed)
Pt brought in by [facility] for c/o vomiting x 2 today; family told ems that pt has been running a low grade fever today;

## 2015-12-05 ENCOUNTER — Encounter (HOSPITAL_COMMUNITY): Payer: Self-pay | Admitting: Internal Medicine

## 2015-12-05 DIAGNOSIS — Z8701 Personal history of pneumonia (recurrent): Secondary | ICD-10-CM | POA: Diagnosis not present

## 2015-12-05 DIAGNOSIS — Z7401 Bed confinement status: Secondary | ICD-10-CM | POA: Diagnosis not present

## 2015-12-05 DIAGNOSIS — Z800 Family history of malignant neoplasm of digestive organs: Secondary | ICD-10-CM | POA: Diagnosis not present

## 2015-12-05 DIAGNOSIS — I129 Hypertensive chronic kidney disease with stage 1 through stage 4 chronic kidney disease, or unspecified chronic kidney disease: Secondary | ICD-10-CM | POA: Diagnosis present

## 2015-12-05 DIAGNOSIS — Z515 Encounter for palliative care: Secondary | ICD-10-CM | POA: Diagnosis not present

## 2015-12-05 DIAGNOSIS — Z66 Do not resuscitate: Secondary | ICD-10-CM | POA: Diagnosis present

## 2015-12-05 DIAGNOSIS — E785 Hyperlipidemia, unspecified: Secondary | ICD-10-CM

## 2015-12-05 DIAGNOSIS — Z931 Gastrostomy status: Secondary | ICD-10-CM | POA: Diagnosis not present

## 2015-12-05 DIAGNOSIS — I639 Cerebral infarction, unspecified: Secondary | ICD-10-CM | POA: Diagnosis not present

## 2015-12-05 DIAGNOSIS — I69351 Hemiplegia and hemiparesis following cerebral infarction affecting right dominant side: Secondary | ICD-10-CM | POA: Diagnosis not present

## 2015-12-05 DIAGNOSIS — R803 Bence Jones proteinuria: Secondary | ICD-10-CM | POA: Diagnosis present

## 2015-12-05 DIAGNOSIS — G40909 Epilepsy, unspecified, not intractable, without status epilepticus: Secondary | ICD-10-CM | POA: Diagnosis present

## 2015-12-05 DIAGNOSIS — E039 Hypothyroidism, unspecified: Secondary | ICD-10-CM | POA: Diagnosis present

## 2015-12-05 DIAGNOSIS — N183 Chronic kidney disease, stage 3 (moderate): Secondary | ICD-10-CM | POA: Diagnosis not present

## 2015-12-05 DIAGNOSIS — R918 Other nonspecific abnormal finding of lung field: Secondary | ICD-10-CM | POA: Diagnosis not present

## 2015-12-05 DIAGNOSIS — I6900 Unspecified sequelae of nontraumatic subarachnoid hemorrhage: Secondary | ICD-10-CM | POA: Diagnosis not present

## 2015-12-05 DIAGNOSIS — G8191 Hemiplegia, unspecified affecting right dominant side: Secondary | ICD-10-CM | POA: Diagnosis not present

## 2015-12-05 DIAGNOSIS — J690 Pneumonitis due to inhalation of food and vomit: Secondary | ICD-10-CM | POA: Diagnosis not present

## 2015-12-05 DIAGNOSIS — I69320 Aphasia following cerebral infarction: Secondary | ICD-10-CM | POA: Diagnosis not present

## 2015-12-05 DIAGNOSIS — Z85528 Personal history of other malignant neoplasm of kidney: Secondary | ICD-10-CM | POA: Diagnosis not present

## 2015-12-05 DIAGNOSIS — L8990 Pressure ulcer of unspecified site, unspecified stage: Secondary | ICD-10-CM | POA: Diagnosis present

## 2015-12-05 DIAGNOSIS — Z7189 Other specified counseling: Secondary | ICD-10-CM | POA: Diagnosis not present

## 2015-12-05 DIAGNOSIS — I69391 Dysphagia following cerebral infarction: Secondary | ICD-10-CM | POA: Diagnosis not present

## 2015-12-05 DIAGNOSIS — R0602 Shortness of breath: Secondary | ICD-10-CM | POA: Diagnosis not present

## 2015-12-05 DIAGNOSIS — Z8249 Family history of ischemic heart disease and other diseases of the circulatory system: Secondary | ICD-10-CM | POA: Diagnosis not present

## 2015-12-05 DIAGNOSIS — R1310 Dysphagia, unspecified: Secondary | ICD-10-CM | POA: Diagnosis present

## 2015-12-05 DIAGNOSIS — R509 Fever, unspecified: Secondary | ICD-10-CM | POA: Diagnosis present

## 2015-12-05 DIAGNOSIS — Z833 Family history of diabetes mellitus: Secondary | ICD-10-CM | POA: Diagnosis not present

## 2015-12-05 DIAGNOSIS — Z7902 Long term (current) use of antithrombotics/antiplatelets: Secondary | ICD-10-CM | POA: Diagnosis not present

## 2015-12-05 DIAGNOSIS — I63032 Cerebral infarction due to thrombosis of left carotid artery: Secondary | ICD-10-CM | POA: Diagnosis not present

## 2015-12-05 DIAGNOSIS — E669 Obesity, unspecified: Secondary | ICD-10-CM | POA: Diagnosis present

## 2015-12-05 DIAGNOSIS — C9000 Multiple myeloma not having achieved remission: Secondary | ICD-10-CM | POA: Diagnosis not present

## 2015-12-05 DIAGNOSIS — J189 Pneumonia, unspecified organism: Secondary | ICD-10-CM | POA: Diagnosis not present

## 2015-12-05 DIAGNOSIS — M6281 Muscle weakness (generalized): Secondary | ICD-10-CM | POA: Diagnosis not present

## 2015-12-05 DIAGNOSIS — R1311 Dysphagia, oral phase: Secondary | ICD-10-CM | POA: Diagnosis not present

## 2015-12-05 LAB — URINE MICROSCOPIC-ADD ON

## 2015-12-05 LAB — URINALYSIS, ROUTINE W REFLEX MICROSCOPIC
Bilirubin Urine: NEGATIVE
Glucose, UA: NEGATIVE mg/dL
HGB URINE DIPSTICK: NEGATIVE
Ketones, ur: NEGATIVE mg/dL
LEUKOCYTES UA: NEGATIVE
NITRITE: NEGATIVE
PROTEIN: 100 mg/dL — AB
Specific Gravity, Urine: 1.015 (ref 1.005–1.030)
pH: 7 (ref 5.0–8.0)

## 2015-12-05 LAB — CBC
HCT: 31 % — ABNORMAL LOW (ref 36.0–46.0)
HEMOGLOBIN: 10.6 g/dL — AB (ref 12.0–15.0)
MCH: 32.2 pg (ref 26.0–34.0)
MCHC: 34.2 g/dL (ref 30.0–36.0)
MCV: 94.2 fL (ref 78.0–100.0)
Platelets: 285 10*3/uL (ref 150–400)
RBC: 3.29 MIL/uL — ABNORMAL LOW (ref 3.87–5.11)
RDW: 15.2 % (ref 11.5–15.5)
WBC: 10.6 10*3/uL — ABNORMAL HIGH (ref 4.0–10.5)

## 2015-12-05 LAB — BASIC METABOLIC PANEL
Anion gap: 3 — ABNORMAL LOW (ref 5–15)
BUN: 16 mg/dL (ref 6–20)
CHLORIDE: 105 mmol/L (ref 101–111)
CO2: 28 mmol/L (ref 22–32)
Calcium: 9 mg/dL (ref 8.9–10.3)
Creatinine, Ser: 0.72 mg/dL (ref 0.44–1.00)
GFR calc Af Amer: >60 mL/min (ref >60)
GLUCOSE: 100 mg/dL — AB (ref 65–99)
POTASSIUM: 4 mmol/L (ref 3.5–5.1)
Sodium: 136 mmol/L (ref 135–145)

## 2015-12-05 LAB — TSH: TSH: 0.379 u[IU]/mL (ref 0.350–4.500)

## 2015-12-05 LAB — LACTIC ACID, PLASMA: LACTIC ACID, VENOUS: 1 mmol/L (ref 0.5–2.0)

## 2015-12-05 MED ORDER — ACETAMINOPHEN 160 MG/5ML PO SOLN
650.0000 mg | Freq: Three times a day (TID) | ORAL | Status: DC
  Administered 2015-12-05 – 2015-12-10 (×17): 650 mg
  Filled 2015-12-05 (×17): qty 20.3

## 2015-12-05 MED ORDER — ALBUTEROL SULFATE (2.5 MG/3ML) 0.083% IN NEBU
3.0000 mL | INHALATION_SOLUTION | Freq: Four times a day (QID) | RESPIRATORY_TRACT | Status: DC | PRN
  Administered 2015-12-06 – 2015-12-09 (×3): 3 mL via RESPIRATORY_TRACT
  Filled 2015-12-05 (×3): qty 3

## 2015-12-05 MED ORDER — VANCOMYCIN HCL IN DEXTROSE 1-5 GM/200ML-% IV SOLN
1000.0000 mg | Freq: Once | INTRAVENOUS | Status: AC
  Administered 2015-12-05: 1000 mg via INTRAVENOUS
  Filled 2015-12-05: qty 200

## 2015-12-05 MED ORDER — PRAVASTATIN SODIUM 40 MG PO TABS
40.0000 mg | ORAL_TABLET | Freq: Every day | ORAL | Status: DC
  Administered 2015-12-05 – 2015-12-09 (×4): 40 mg
  Filled 2015-12-05 (×5): qty 1

## 2015-12-05 MED ORDER — CLOPIDOGREL BISULFATE 75 MG PO TABS
75.0000 mg | ORAL_TABLET | Freq: Every day | ORAL | Status: DC
  Administered 2015-12-05 – 2015-12-10 (×6): 75 mg
  Filled 2015-12-05 (×6): qty 1

## 2015-12-05 MED ORDER — PIPERACILLIN-TAZOBACTAM 3.375 G IVPB
3.3750 g | Freq: Three times a day (TID) | INTRAVENOUS | Status: DC
  Administered 2015-12-05 – 2015-12-06 (×4): 3.375 g via INTRAVENOUS
  Filled 2015-12-05 (×4): qty 50

## 2015-12-05 MED ORDER — ONDANSETRON HCL 4 MG/2ML IJ SOLN
4.0000 mg | Freq: Once | INTRAMUSCULAR | Status: AC
  Administered 2015-12-05: 4 mg via INTRAVENOUS
  Filled 2015-12-05: qty 2

## 2015-12-05 MED ORDER — CLONIDINE HCL 0.3 MG/24HR TD PTWK
0.3000 mg | MEDICATED_PATCH | TRANSDERMAL | Status: DC
  Administered 2015-12-07: 0.3 mg via TRANSDERMAL
  Filled 2015-12-05: qty 1

## 2015-12-05 MED ORDER — ALBUTEROL SULFATE (2.5 MG/3ML) 0.083% IN NEBU
2.5000 mg | INHALATION_SOLUTION | Freq: Two times a day (BID) | RESPIRATORY_TRACT | Status: DC
  Administered 2015-12-05 – 2015-12-06 (×3): 2.5 mg via RESPIRATORY_TRACT
  Filled 2015-12-05 (×3): qty 3

## 2015-12-05 MED ORDER — DULOXETINE HCL 60 MG PO CPEP
60.0000 mg | ORAL_CAPSULE | Freq: Every day | ORAL | Status: DC
  Administered 2015-12-05 – 2015-12-10 (×6): 60 mg via ORAL
  Filled 2015-12-05 (×6): qty 1

## 2015-12-05 MED ORDER — LEVOTHYROXINE SODIUM 75 MCG PO TABS
75.0000 ug | ORAL_TABLET | ORAL | Status: DC
  Administered 2015-12-05 – 2015-12-08 (×4): 75 ug
  Filled 2015-12-05 (×4): qty 1

## 2015-12-05 MED ORDER — SODIUM CHLORIDE 0.9 % IV BOLUS (SEPSIS)
1000.0000 mL | Freq: Once | INTRAVENOUS | Status: AC
  Administered 2015-12-05: 1000 mL via INTRAVENOUS

## 2015-12-05 MED ORDER — JEVITY 1.5 CAL/FIBER PO LIQD
237.0000 mL | Freq: Every day | ORAL | Status: DC
  Administered 2015-12-05 – 2015-12-10 (×22): 237 mL
  Filled 2015-12-05 (×33): qty 1000

## 2015-12-05 MED ORDER — PANTOPRAZOLE SODIUM 40 MG PO PACK
40.0000 mg | PACK | Freq: Every day | ORAL | Status: DC
  Administered 2015-12-06 – 2015-12-10 (×5): 40 mg
  Filled 2015-12-05 (×7): qty 20

## 2015-12-05 MED ORDER — LACOSAMIDE 50 MG PO TABS
150.0000 mg | ORAL_TABLET | Freq: Two times a day (BID) | ORAL | Status: DC
Start: 2015-12-05 — End: 2015-12-11
  Administered 2015-12-05 – 2015-12-10 (×11): 150 mg
  Filled 2015-12-05 (×11): qty 3

## 2015-12-05 MED ORDER — BOOST PLUS PO LIQD
237.0000 mL | Freq: Every day | ORAL | Status: DC
  Filled 2015-12-05 (×3): qty 237

## 2015-12-05 MED ORDER — SODIUM CHLORIDE 0.9 % IV SOLN
INTRAVENOUS | Status: DC
  Administered 2015-12-05 – 2015-12-08 (×3): via INTRAVENOUS

## 2015-12-05 MED ORDER — FREE WATER
100.0000 mL | Status: DC
  Administered 2015-12-05 – 2015-12-10 (×31): 100 mL

## 2015-12-05 MED ORDER — HEPARIN SODIUM (PORCINE) 5000 UNIT/ML IJ SOLN
5000.0000 [IU] | Freq: Three times a day (TID) | INTRAMUSCULAR | Status: DC
  Administered 2015-12-05 – 2015-12-10 (×17): 5000 [IU] via SUBCUTANEOUS
  Filled 2015-12-05 (×17): qty 1

## 2015-12-05 MED ORDER — LEVOTHYROXINE SODIUM 75 MCG PO TABS
37.5000 ug | ORAL_TABLET | ORAL | Status: DC
  Administered 2015-12-09 – 2015-12-10 (×2): 37.5 ug via ORAL
  Filled 2015-12-05 (×2): qty 1

## 2015-12-05 MED ORDER — PIPERACILLIN-TAZOBACTAM 3.375 G IVPB
3.3750 g | Freq: Once | INTRAVENOUS | Status: AC
  Administered 2015-12-05: 3.375 g via INTRAVENOUS
  Filled 2015-12-05: qty 50

## 2015-12-05 MED ORDER — PROCHLORPERAZINE MALEATE 5 MG PO TABS
10.0000 mg | ORAL_TABLET | Freq: Four times a day (QID) | ORAL | Status: DC | PRN
  Administered 2015-12-07: 10 mg
  Filled 2015-12-05: qty 2

## 2015-12-05 MED ORDER — CARVEDILOL 3.125 MG PO TABS
6.2500 mg | ORAL_TABLET | Freq: Two times a day (BID) | ORAL | Status: DC
  Administered 2015-12-05 – 2015-12-10 (×10): 6.25 mg
  Filled 2015-12-05 (×11): qty 2

## 2015-12-05 MED ORDER — AMLODIPINE BESYLATE 5 MG PO TABS
10.0000 mg | ORAL_TABLET | Freq: Every day | ORAL | Status: DC
  Administered 2015-12-05 – 2015-12-10 (×6): 10 mg
  Filled 2015-12-05 (×6): qty 2

## 2015-12-05 MED ORDER — VANCOMYCIN HCL IN DEXTROSE 1-5 GM/200ML-% IV SOLN
1000.0000 mg | Freq: Two times a day (BID) | INTRAVENOUS | Status: DC
  Administered 2015-12-05 – 2015-12-06 (×3): 1000 mg via INTRAVENOUS
  Filled 2015-12-05 (×3): qty 200

## 2015-12-05 MED ORDER — LEVETIRACETAM 100 MG/ML PO SOLN
1000.0000 mg | Freq: Two times a day (BID) | ORAL | Status: DC
  Administered 2015-12-05 – 2015-12-10 (×11): 1000 mg
  Filled 2015-12-05 (×13): qty 10

## 2015-12-05 MED ORDER — POLYETHYLENE GLYCOL 3350 17 G PO PACK: 17.0000 g | PACK | Freq: Every day | ORAL | Status: DC | PRN

## 2015-12-05 MED ORDER — RANITIDINE HCL 150 MG/10ML PO SYRP
150.0000 mg | ORAL_SOLUTION | Freq: Every day | ORAL | Status: DC
  Administered 2015-12-05 – 2015-12-10 (×6): 150 mg
  Filled 2015-12-05 (×7): qty 10

## 2015-12-05 MED ORDER — HYDRALAZINE HCL 25 MG PO TABS
25.0000 mg | ORAL_TABLET | Freq: Three times a day (TID) | ORAL | Status: DC
  Administered 2015-12-05 – 2015-12-10 (×15): 25 mg
  Filled 2015-12-05 (×16): qty 1

## 2015-12-05 NOTE — Progress Notes (Signed)
ANTIBIOTIC CONSULT NOTE - INITIAL  Pharmacy Consult for Vancomycin and Zosyn Indication: pneumonia  Allergies  Allergen Reactions  . Morphine Nausea And Vomiting   Patient Measurements: Height: 5\' 6"  (167.6 cm) Weight: 182 lb (82.555 kg) IBW/kg (Calculated) : 59.3  Vital Signs: Temp: 100.7 F (38.2 C) (12/13 0511) Temp Source: Axillary (12/13 0511) BP: 150/86 mmHg (12/13 0511) Pulse Rate: 96 (12/13 0511) Intake/Output from previous day:   Intake/Output from this shift:    Labs:  Recent Labs  12/23/2015 2241 12/05/15 0550  WBC 12.5* 10.6*  HGB 11.2* 10.6*  PLT 296 285  CREATININE 0.76  --    Estimated Creatinine Clearance: 71.9 mL/min (by C-G formula based on Cr of 0.76). No results for input(s): VANCOTROUGH, VANCOPEAK, VANCORANDOM, GENTTROUGH, GENTPEAK, GENTRANDOM, TOBRATROUGH, TOBRAPEAK, TOBRARND, AMIKACINPEAK, AMIKACINTROU, AMIKACIN in the last 72 hours.   Microbiology: Recent Results (from the past 720 hour(s))  Urine culture     Status: None   Collection Time: 11/29/15  9:50 PM  Result Value Ref Range Status   Specimen Description URINE, CATHETERIZED  Final   Special Requests NONE  Final   Culture   Final    7,000 COLONIES/mL INSIGNIFICANT GROWTH Performed at [facility]    Report Status 12/01/2015 FINAL  Final   Medical History: Past Medical History  Diagnosis Date  . Vertigo, intermittent   . Hypothyroidism   . Hyperlipidemia   . Obesity   . Hypertension     severe and resistant to treatment; 2008-negative left renal angiogram  . Vitiligo   . Degenerative disc disease, lumbar   . Degenerative disc disease, cervical   . Chest pain 2008    2008-normal coronary angiography  . Stroke ([facility]) 09/18/2014    right hemiparesis  . Renal cell carcinoma     Right nephrectomy  . Multiple myeloma ([facility]) 08/26/2014  . History of pneumonia   . Emphysema of lung ([facility])   . CVA (cerebral infarction) 06/2015   Anti-infectives    Start     Dose/Rate  Route Frequency Ordered Stop   12/05/15 1000  piperacillin-tazobactam (ZOSYN) IVPB 3.375 g     3.375 g 12.5 mL/hr over 240 Minutes Intravenous Every 8 hours 12/05/15 0738     12/05/15 1000  vancomycin (VANCOCIN) IVPB 1000 mg/200 mL premix    Comments:  (retrieve bag from PYXIS machine)   1,000 mg 200 mL/hr over 60 Minutes Intravenous Every 12 hours 12/05/15 0741     12/05/15 0200  vancomycin (VANCOCIN) IVPB 1000 mg/200 mL premix     1,000 mg 200 mL/hr over 60 Minutes Intravenous  Once 12/05/15 0151 12/05/15 0352   12/05/15 0200  piperacillin-tazobactam (ZOSYN) IVPB 3.375 g     3.375 g 12.5 mL/hr over 240 Minutes Intravenous  Once 12/05/15 0151 12/05/15 0300     Assessment: 69 y.o. 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 Work up in the ER with CXR showed LLL infiltrate unclear if new PNA or aspiration, leukocytosis with WBC of 12.5, but normal electrolytes and renal fx  Goal of Therapy:  Vancomycin trough level 15-20 mcg/ml  Plan:  Zosyn  3.375gm IV q8h, EID Vancomycin 1000mg  IV q12hrs Check trough at steady state Monitor labs, renal fxn, cultures and progress  Margo Aye, Mirelle Biskup A 12/05/2015,7:41 AM

## 2015-12-05 NOTE — Progress Notes (Signed)
Patient Demographics  Melissa [name], is a 69 y.o. female, DOB - Jul 31, 1946, OZH:[id number]  Admit date - 2015-12-21   Admitting Physician Houston Siren, MD  Outpatient Primary MD for the patient is [name], MD  LOS - 0   Chief Complaint  Patient presents with  . Emesis       Admission HPI/Brief narrative: 69 y.o. 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  Subjective:   Melissa [name] today is nonverbal at baseline, can't provide any complaints.  Assessment & Plan    Principal Problem:   HCAP (healthcare-associated pneumonia) Active Problems:   Hypothyroid   Obesity   Pulmonary fibrosis (HCC)   Bence Jones proteinuria   CKD (chronic kidney disease) stage 3, GFR 30-59 ml/min   Hemiplegia affecting right dominant side (HCC)   Multiple myeloma (HCC)   HLD (hyperlipidemia)   Seizure disorder (HCC)   PEG (percutaneous endoscopic gastrostomy) status (HCC)   Pressure ulcer  HCAP - This is patient's third admission in few months secondary to HCAP, most likely aspiration, she is a strict nothing by mouth, only on tube feed, started on Zantac in the past to decrease secretions. - Continue with IV vancomycin and Zosyn. - Follow on blood cultures - Add Protonix to Zantac. - Aspiration precaution, and head of bed elevation.  History of CVA - Bedbound with right hemiplegia, and aphasia. - Continue with Plavix and statin - Continue with tube feed  CKD stage III - Stable, continue with IV fluids  Hypothyroid -  Continue with Synthroid   Hyperlipidemia - Continue with statin  Hypertension - Continue with home medication  Code Status:DNR  Family Communication: Discussed with Daughter at bedside   Disposition Plan: PT consulted   Procedures  None  Consults   None    Medications  Scheduled Meds: . acetaminophen  650 mg Per Tube 3 times per day  . albuterol  2.5 mg Nebulization BID  . amLODipine  10 mg Per Tube Daily  . carvedilol  6.25 mg Per Tube BID WC  . cloNIDine  0.3 mg Transdermal Weekly  . clopidogrel  75 mg Per Tube Daily  . DULoxetine  60 mg Oral Daily  . feeding supplement (JEVITY 1.5 CAL/FIBER)  237 mL Per Tube 5 X Daily  . free water  100 mL Per Tube 6 times per day  . heparin  5,000 Units Subcutaneous 3 times per day  . hydrALAZINE  25 mg Per Tube TID  . lacosamide  150 mg Per Tube BID  . levETIRAcetam  1,000 mg Per Tube BID  . [START ON 12/09/2015] levothyroxine  37.5 mcg Oral Once per day on Sun Sat  . levothyroxine  75 mcg Per Tube Once per day on Mon Tue Wed Thu Fri  . pantoprazole sodium  40 mg Per Tube Daily  . piperacillin-tazobactam (ZOSYN)  IV  3.375 g Intravenous Q8H  .  pravastatin  40 mg Per Tube q1800  . ranitidine  150 mg Per Tube Daily  . vancomycin  1,000 mg Intravenous Q12H   Continuous Infusions: . sodium chloride     PRN Meds:.albuterol, polyethylene glycol, prochlorperazine  DVT Prophylaxis  Heparin  Lab Results  Component Value Date   PLT 285 12/05/2015    Antibiotics    Anti-infectives    Start     Dose/Rate Route Frequency Ordered Stop   12/05/15 1000  piperacillin-tazobactam (ZOSYN) IVPB 3.375 g     3.375 g 12.5 mL/hr over 240 Minutes Intravenous Every 8 hours 12/05/15 0738     12/05/15 1000  vancomycin (VANCOCIN) IVPB 1000 mg/200 mL premix    Comments:  (retrieve bag from PYXIS machine)   1,000 mg 200 mL/hr over 60 Minutes Intravenous Every 12 hours 12/05/15 0741     12/05/15 0200  vancomycin (VANCOCIN) IVPB 1000 mg/200 mL  premix     1,000 mg 200 mL/hr over 60 Minutes Intravenous  Once 12/05/15 0151 12/05/15 0352   12/05/15 0200  piperacillin-tazobactam (ZOSYN) IVPB 3.375 g     3.375 g 12.5 mL/hr over 240 Minutes Intravenous  Once 12/05/15 0151 12/05/15 0300          Objective:   Filed Vitals:   12/05/15 0345 12/05/15 0400 12/05/15 0415 12/05/15 0511  BP:  141/81  150/86  Pulse: 80  79 96  Temp:    100.7 F (38.2 C)  TempSrc:    Axillary  Resp:    22  Height:    5\' 6"  (1.676 m)  Weight:    82.555 kg (182 lb)  SpO2: 100% 100% 100% 98%    Wt Readings from Last 3 Encounters:  12/05/15 82.555 kg (182 lb)  11/29/15 81.647 kg (180 lb)  10/04/15 81.012 kg (178 lb 9.6 oz)     Intake/Output Summary (Last 24 hours) at 12/05/15 0908 Last data filed at 12/05/15 0800  Gross per 24 hour  Intake      0 ml  Output      0 ml  Net      0 ml     Physical Exam  Awake , nonverbal  [place].AT,PERRAL Supple Neck,No JVD, Symmetrical Chest wall movement, Good air movement bilaterally, no wheezing  RRR,No Gallops,Rubs or new Murmurs, No Parasternal Heave +ve B.Sounds, Abd Soft, No tenderness, PEG present no discharge from site . No Cyanosis, Clubbing or edema.   Data Review   Micro Results Recent Results (from the past 240 hour(s))  Urine culture     Status: None   Collection Time: 11/29/15  9:50 PM  Result Value Ref Range Status   Specimen Description URINE, CATHETERIZED  Final   Special Requests NONE  Final   Culture   Final    7,000 COLONIES/mL INSIGNIFICANT GROWTH Performed at [facility]    Report Status 12/01/2015 FINAL  Final    Radiology Reports Dg Chest 2 View  12/08/2015  CLINICAL DATA:  Fever and vomiting. EXAM: CHEST  2 VIEW COMPARISON:  11/29/15 FINDINGS: Low lateral view is essentially nondiagnostic secondary to patient positioning, including the arm overlying the chest. Midline trachea. Moderate cardiomegaly. Possible small left pleural effusion. No pneumothorax. No overt  congestive failure. Mild right base atelectasis. Left base airspace disease. There may be mild pulmonary venous congestion, superimposed upon low lung volumes. IMPRESSION: Left base airspace disease is suspicious for residual or recurrent infection/ aspiration. Cardiomegaly and low lung volumes. Cannot exclude mild pulmonary venous congestion  Electronically Signed   By: Jeronimo Greaves M.D.   On: 12/06/2015 22:13   Dg Chest 2 View  11/29/2015  CLINICAL DATA:  Acute onset of fever and congestion. Assess for aspiration. Initial encounter. EXAM: CHEST  2 VIEW COMPARISON:  Chest radiograph performed 10/04/2015 FINDINGS: The lungs are mildly hypoexpanded. Bibasilar airspace opacities, right greater than left, raise suspicion for aspiration pneumonia given their appearance. There is no evidence of pleural effusion or pneumothorax. The heart is mildly enlarged. No acute osseous abnormalities are seen. IMPRESSION: Lungs mildly hypoexpanded. Bibasilar airspace opacities, right greater than left, are suspicious for aspiration pneumonia given their appearance. Mild cardiomegaly noted. These results were called by telephone at the time of interpretation on 11/29/2015 at 9:43 pm to Dr. Derwood Kaplan, who verbally acknowledged these results. Electronically Signed   By: Roanna Raider M.D.   On: 11/29/2015 21:43     CBC  Recent Labs Lab 11/29/15 2105 12/09/2015 2241 12/05/15 0550  WBC 6.9 12.5* 10.6*  HGB 11.7* 11.2* 10.6*  HCT 33.2* 31.9* 31.0*  PLT 209 296 285  MCV 94.3 93.5 94.2  MCH 33.2 32.8 32.2  MCHC 35.2 35.1 34.2  RDW 15.2 15.1 15.2  LYMPHSABS 2.1 2.0  --   MONOABS 0.9 0.8  --   EOSABS 0.0 0.0  --   BASOSABS 0.0 0.0  --     Chemistries   Recent Labs Lab 11/29/15 2105 12/06/2015 2241 12/05/15 0550  NA 137 134* 136  K 4.1 4.0 4.0  CL 103 100* 105  CO2 29 28 28   GLUCOSE 90 107* 100*  BUN 23* 16 16  CREATININE 0.78 0.76 0.72  CALCIUM 9.4 9.2 9.0  AST  --  19  --   ALT  --  18  --   ALKPHOS   --  43  --   BILITOT  --  0.4  --    ------------------------------------------------------------------------------------------------------------------ estimated creatinine clearance is 71.9 mL/min (by C-G formula based on Cr of 0.72). ------------------------------------------------------------------------------------------------------------------ No results for input(s): HGBA1C in the last 72 hours. ------------------------------------------------------------------------------------------------------------------ No results for input(s): CHOL, HDL, LDLCALC, TRIG, CHOLHDL, LDLDIRECT in the last 72 hours. ------------------------------------------------------------------------------------------------------------------  Recent Labs  12/05/15 0550  TSH 0.379   ------------------------------------------------------------------------------------------------------------------ No results for input(s): VITAMINB12, FOLATE, FERRITIN, TIBC, IRON, RETICCTPCT in the last 72 hours.  Coagulation profile No results for input(s): INR, PROTIME in the last 168 hours.  No results for input(s): DDIMER in the last 72 hours.  Cardiac Enzymes No results for input(s): CKMB, TROPONINI, MYOGLOBIN in the last 168 hours.  Invalid input(s): CK ------------------------------------------------------------------------------------------------------------------ Invalid input(s): POCBNP     Time Spent in minutes  No charge   Randol Kern, Fred Hammes M.D on 12/05/2015 at 9:08 AM  Between 7am to 7pm - Pager - [phone]  After 7pm go to [email or web address] - password [facility]  [facility]   Office  [phone]

## 2015-12-05 NOTE — ED Provider Notes (Signed)
CSN: [id number]     Arrival date & time 12/08/2015  2119 History   First MD Initiated Contact with Patient 11/25/2015 2347     Chief Complaint  Patient presents with  . Emesis     (Consider location/radiation/quality/duration/timing/severity/associated sxs/prior Treatment) The history is provided by a relative (Pt's daughter.).   Melissa [name] is a 69 y.o. female who a bed bound patient (lives with daughter) since her cva in 2015 left her with right sided hemiparesis presenting with persistent fever (102.6 upon presentation tonight) despite outpatient treatment for the past 5 days for aspiration pneumonia with Augmentin and Flagyl.  She has a PEG tube and is npo at baseline.  She has had no diarrhea, she uses depends and has had no change in urination pattern.  She was previously admitted for similar aspiration pneumonia 2 months ago.  She has had tylenol today for fever reduction. Daughter endorses a wet sometimes productive cough with thick sputum.        Past Medical History  Diagnosis Date  . Vertigo, intermittent   . Hypothyroidism   . Hyperlipidemia   . Obesity   . Hypertension     severe and resistant to treatment; 2008-negative left renal angiogram  . Vitiligo   . Degenerative disc disease, lumbar   . Degenerative disc disease, cervical   . Chest pain 2008    2008-normal coronary angiography  . Stroke (HCC) 09/18/2014    right hemiparesis  . Renal cell carcinoma     Right nephrectomy  . Multiple myeloma (HCC) 08/26/2014  . History of pneumonia   . Emphysema of lung (HCC)   . CVA (cerebral infarction) 06/2015   Past Surgical History  Procedure Laterality Date  . Tubal ligation  1983    Bilateral  . Nephrectomy      Right; secondary to renal cell carcinoma  . Colonoscopy N/A 04/21/2014    Procedure: COLONOSCOPY;  Surgeon: Najeeb U Rehman, MD;  Location: [facility];  Service: Endoscopy;  Laterality: N/A;  . Colonoscopy N/A 04/21/2014    Procedure: COLONOSCOPY;   Surgeon: Najeeb U Rehman, MD;  Location: [facility];  Service: Endoscopy;  Laterality: N/A;  1030  . Tee without cardioversion N/A 09/23/2014    Procedure: TRANSESOPHAGEAL ECHOCARDIOGRAM (TEE);  Surgeon: Mihai Croitoru, MD;  Location: [facility];  Service: Cardiovascular;  Laterality: N/A;  . Peg tube placement  09/2014   Family History  Problem Relation Age of Onset  . Heart disease Mother 45  . Hypertension Mother   . Colon cancer Father   . Hypertension Sister     x2  . Diabetes Sister     x2  . Heart failure Sister   . Lupus Brother    Social History  Substance Use Topics  . Smoking status: Never Smoker   . Smokeless tobacco: Never Used  . Alcohol Use: No   OB History    No data available     Review of Systems  Unable to perform ROS: Patient nonverbal  Constitutional: Positive for fever.  Respiratory: Positive for cough.   Gastrointestinal: Positive for vomiting.      Allergies  Morphine  Home Medications   Prior to Admission medications   Medication Sig Start Date End Date Taking? Authorizing Provider  albuterol (PROVENTIL) (2.5 MG/3ML) 0.083% nebulizer solution Take 3 mLs (2.5 mg total) by nebulization every 6 (six) hours as needed for wheezing or shortness of breath. Patient taking differently: Take 2.5 mg by nebulization 2 (two)   CSN: [id number]     Arrival date & time 12/08/2015  2119 History   First MD Initiated Contact with Patient 11/25/2015 2347     Chief Complaint  Patient presents with  . Emesis     (Consider location/radiation/quality/duration/timing/severity/associated sxs/prior Treatment) The history is provided by a relative (Pt's daughter.).   Melissa [name] is a 69 y.o. female who a bed bound patient (lives with daughter) since her cva in 2015 left her with right sided hemiparesis presenting with persistent fever (102.6 upon presentation tonight) despite outpatient treatment for the past 5 days for aspiration pneumonia with Augmentin and Flagyl.  She has a PEG tube and is npo at baseline.  She has had no diarrhea, she uses depends and has had no change in urination pattern.  She was previously admitted for similar aspiration pneumonia 2 months ago.  She has had tylenol today for fever reduction. Daughter endorses a wet sometimes productive cough with thick sputum.        Past Medical History  Diagnosis Date  . Vertigo, intermittent   . Hypothyroidism   . Hyperlipidemia   . Obesity   . Hypertension     severe and resistant to treatment; 2008-negative left renal angiogram  . Vitiligo   . Degenerative disc disease, lumbar   . Degenerative disc disease, cervical   . Chest pain 2008    2008-normal coronary angiography  . Stroke (HCC) 09/18/2014    right hemiparesis  . Renal cell carcinoma     Right nephrectomy  . Multiple myeloma (HCC) 08/26/2014  . History of pneumonia   . Emphysema of lung (HCC)   . CVA (cerebral infarction) 06/2015   Past Surgical History  Procedure Laterality Date  . Tubal ligation  1983    Bilateral  . Nephrectomy      Right; secondary to renal cell carcinoma  . Colonoscopy N/A 04/21/2014    Procedure: COLONOSCOPY;  Surgeon: Najeeb U Rehman, MD;  Location: [facility];  Service: Endoscopy;  Laterality: N/A;  . Colonoscopy N/A 04/21/2014    Procedure: COLONOSCOPY;   Surgeon: Najeeb U Rehman, MD;  Location: [facility];  Service: Endoscopy;  Laterality: N/A;  1030  . Tee without cardioversion N/A 09/23/2014    Procedure: TRANSESOPHAGEAL ECHOCARDIOGRAM (TEE);  Surgeon: Mihai Croitoru, MD;  Location: [facility];  Service: Cardiovascular;  Laterality: N/A;  . Peg tube placement  09/2014   Family History  Problem Relation Age of Onset  . Heart disease Mother 45  . Hypertension Mother   . Colon cancer Father   . Hypertension Sister     x2  . Diabetes Sister     x2  . Heart failure Sister   . Lupus Brother    Social History  Substance Use Topics  . Smoking status: Never Smoker   . Smokeless tobacco: Never Used  . Alcohol Use: No   OB History    No data available     Review of Systems  Unable to perform ROS: Patient nonverbal  Constitutional: Positive for fever.  Respiratory: Positive for cough.   Gastrointestinal: Positive for vomiting.      Allergies  Morphine  Home Medications   Prior to Admission medications   Medication Sig Start Date End Date Taking? Authorizing Provider  albuterol (PROVENTIL) (2.5 MG/3ML) 0.083% nebulizer solution Take 3 mLs (2.5 mg total) by nebulization every 6 (six) hours as needed for wheezing or shortness of breath. Patient taking differently: Take 2.5 mg by nebulization 2 (two)   CSN: [id number]     Arrival date & time 12/08/2015  2119 History   First MD Initiated Contact with Patient 11/25/2015 2347     Chief Complaint  Patient presents with  . Emesis     (Consider location/radiation/quality/duration/timing/severity/associated sxs/prior Treatment) The history is provided by a relative (Pt's daughter.).   Melissa [name] is a 69 y.o. female who a bed bound patient (lives with daughter) since her cva in 2015 left her with right sided hemiparesis presenting with persistent fever (102.6 upon presentation tonight) despite outpatient treatment for the past 5 days for aspiration pneumonia with Augmentin and Flagyl.  She has a PEG tube and is npo at baseline.  She has had no diarrhea, she uses depends and has had no change in urination pattern.  She was previously admitted for similar aspiration pneumonia 2 months ago.  She has had tylenol today for fever reduction. Daughter endorses a wet sometimes productive cough with thick sputum.        Past Medical History  Diagnosis Date  . Vertigo, intermittent   . Hypothyroidism   . Hyperlipidemia   . Obesity   . Hypertension     severe and resistant to treatment; 2008-negative left renal angiogram  . Vitiligo   . Degenerative disc disease, lumbar   . Degenerative disc disease, cervical   . Chest pain 2008    2008-normal coronary angiography  . Stroke (HCC) 09/18/2014    right hemiparesis  . Renal cell carcinoma     Right nephrectomy  . Multiple myeloma (HCC) 08/26/2014  . History of pneumonia   . Emphysema of lung (HCC)   . CVA (cerebral infarction) 06/2015   Past Surgical History  Procedure Laterality Date  . Tubal ligation  1983    Bilateral  . Nephrectomy      Right; secondary to renal cell carcinoma  . Colonoscopy N/A 04/21/2014    Procedure: COLONOSCOPY;  Surgeon: Najeeb U Rehman, MD;  [facility]: [facility];  Service: Endoscopy;  Laterality: N/A;  . Colonoscopy N/A 04/21/2014    Procedure: COLONOSCOPY;   Surgeon: Najeeb U Rehman, MD;  [facility]: [facility];  Service: Endoscopy;  Laterality: N/A;  1030  . Tee without cardioversion N/A 09/23/2014    Procedure: TRANSESOPHAGEAL ECHOCARDIOGRAM (TEE);  Surgeon: Mihai Croitoru, MD;  Location: [facility];  Service: Cardiovascular;  Laterality: N/A;  . Peg tube placement  09/2014   Family History  Problem Relation Age of Onset  . Heart disease Mother 45  . Hypertension Mother   . Colon cancer Father   . Hypertension Sister     x2  . Diabetes Sister     x2  . Heart failure Sister   . Lupus Brother    Social History  Substance Use Topics  . Smoking status: Never Smoker   . Smokeless tobacco: Never Used  . Alcohol Use: No   OB History    No data available     Review of Systems  Unable to perform ROS: Patient nonverbal  Constitutional: Positive for fever.  Respiratory: Positive for cough.   Gastrointestinal: Positive for vomiting.      Allergies  Morphine  Home Medications   Prior to Admission medications   Medication Sig Start Date End Date Taking? Authorizing Provider  albuterol (PROVENTIL) (2.5 MG/3ML) 0.083% nebulizer solution Take 3 mLs (2.5 mg total) by nebulization every 6 (six) hours as needed for wheezing or shortness of breath. Patient taking differently: Take 2.5 mg by nebulization 2 (two)   CSN: [id number]     Arrival date & time 12/08/2015  2119 History   First MD Initiated Contact with Patient 11/25/2015 2347     Chief Complaint  Patient presents with  . Emesis     (Consider location/radiation/quality/duration/timing/severity/associated sxs/prior Treatment) The history is provided by a relative (Pt's daughter.).   Melissa [name] is a 69 y.o. female who a bed bound patient (lives with daughter) since her cva in 2015 left her with right sided hemiparesis presenting with persistent fever (102.6 upon presentation tonight) despite outpatient treatment for the past 5 days for aspiration pneumonia with Augmentin and Flagyl.  She has a PEG tube and is npo at baseline.  She has had no diarrhea, she uses depends and has had no change in urination pattern.  She was previously admitted for similar aspiration pneumonia 2 months ago.  She has had tylenol today for fever reduction. Daughter endorses a wet sometimes productive cough with thick sputum.        Past Medical History  Diagnosis Date  . Vertigo, intermittent   . Hypothyroidism   . Hyperlipidemia   . Obesity   . Hypertension     severe and resistant to treatment; 2008-negative left renal angiogram  . Vitiligo   . Degenerative disc disease, lumbar   . Degenerative disc disease, cervical   . Chest pain 2008    2008-normal coronary angiography  . Stroke (HCC) 09/18/2014    right hemiparesis  . Renal cell carcinoma     Right nephrectomy  . Multiple myeloma (HCC) 08/26/2014  . History of pneumonia   . Emphysema of lung (HCC)   . CVA (cerebral infarction) 06/2015   Past Surgical History  Procedure Laterality Date  . Tubal ligation  1983    Bilateral  . Nephrectomy      Right; secondary to renal cell carcinoma  . Colonoscopy N/A 04/21/2014    Procedure: COLONOSCOPY;  Surgeon: Najeeb U Rehman, MD;  [facility]: [facility];  Service: Endoscopy;  Laterality: N/A;  . Colonoscopy N/A 04/21/2014    Procedure: COLONOSCOPY;   Surgeon: Najeeb U Rehman, MD;  [facility]: [facility];  Service: Endoscopy;  Laterality: N/A;  1030  . Tee without cardioversion N/A 09/23/2014    Procedure: TRANSESOPHAGEAL ECHOCARDIOGRAM (TEE);  Surgeon: Mihai Croitoru, MD;  Location: [facility];  Service: Cardiovascular;  Laterality: N/A;  . Peg tube placement  09/2014   Family History  Problem Relation Age of Onset  . Heart disease Mother 45  . Hypertension Mother   . Colon cancer Father   . Hypertension Sister     x2  . Diabetes Sister     x2  . Heart failure Sister   . Lupus Brother    Social History  Substance Use Topics  . Smoking status: Never Smoker   . Smokeless tobacco: Never Used  . Alcohol Use: No   OB History    No data available     Review of Systems  Unable to perform ROS: Patient nonverbal  Constitutional: Positive for fever.  Respiratory: Positive for cough.   Gastrointestinal: Positive for vomiting.      Allergies  Morphine  Home Medications   Prior to Admission medications   Medication Sig Start Date End Date Taking? Authorizing Provider  albuterol (PROVENTIL) (2.5 MG/3ML) 0.083% nebulizer solution Take 3 mLs (2.5 mg total) by nebulization every 6 (six) hours as needed for wheezing or shortness of breath. Patient taking differently: Take 2.5 mg by nebulization 2 (two)

## 2015-12-05 NOTE — H&P (Signed)
[facility] History and Physical  Melissa [name] NFA:[id number] DOB: 03/19/1946    PCP:   [name], MD   Chief Complaint: fever and coughs.   HPI:  Melissa [name] is an 69 y.o. 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 Work up in the ER with CXR showed LLL infiltrate unclear if new PNA or aspiration, leukocytosis with WBC of 12.5,  but normal electrolytes and renal fx test. She has no respiratory distress. In the ER, she was given IV Van/Zosyn, and hospitalist was asked to admit her for  PNA unclear HCAP or aspiration. She is a DNR.    Rewiew of Systems: Unable.   Past Medical History  Diagnosis Date  . Vertigo, intermittent   . Hypothyroidism   . Hyperlipidemia   . Obesity   . Hypertension     severe and resistant to treatment; 2008-negative left renal angiogram  . Vitiligo   . Degenerative disc disease, lumbar   . Degenerative disc disease, cervical   . Chest pain 2008    2008-normal coronary angiography  . Stroke (HCC) 09/18/2014    right hemiparesis  . Renal cell carcinoma     Right nephrectomy  . Multiple myeloma (HCC) 08/26/2014  . History of pneumonia   . Emphysema of lung (HCC)   . CVA (cerebral infarction) 06/2015    Past Surgical History  Procedure Laterality Date  . Tubal ligation  1983    Bilateral  . Nephrectomy      Right; secondary to renal cell carcinoma  . Colonoscopy N/A 04/21/2014    Procedure: COLONOSCOPY;  Surgeon: Malissa Hippo, MD;  Location[facility];  Service: Endoscopy;  Laterality: N/A;  . Colonoscopy N/A 04/21/2014    Procedure: COLONOSCOPY;   Surgeon: Malissa Hippo, MD;  Location: [facility];  Service: Endoscopy;  Laterality: N/A;  1030  . Tee without cardioversion N/A 09/23/2014    Procedure: TRANSESOPHAGEAL ECHOCARDIOGRAM (TEE);  Surgeon: Thurmon Fair, MD;  Location: [facility] ENDOSCOPY;  Service: Cardiovascular;  Laterality: N/A;  . Peg tube placement  09/2014    Medications:  HOME MEDS: Prior to Admission medications   Medication Sig Start Date End Date Taking? Authorizing Provider  albuterol (PROVENTIL) (2.5 MG/3ML) 0.083% nebulizer solution Take 3 mLs (2.5 mg total) by nebulization every 6 (six) hours as needed for wheezing or shortness of breath. Patient taking differently: Take 2.5 mg by nebulization 2 (two) times daily.  10/08/15  Yes Erick Blinks, MD  amLODipine (NORVASC) 10 MG tablet Place 1 tablet (10 mg total) into feeding tube daily. 09/09/15  Yes Standley Brooking, MD  amoxicillin-clavulanate (AUGMENTIN) 875-125 MG tablet Place 1 tablet into feeding tube 2 (two) times daily. 11/29/15  Yes Derwood Kaplan, MD  carvedilol (COREG) 6.25 MG tablet TAKE ONE TABLET INTO FEEDING TUBE TWICE DAILY WITH MEALS 06/29/15  Yes Kerri Perches, MD  cholecalciferol (VITAMIN D) 1000 UNITS tablet 1,000 Units by Feeding Tube route daily.   Yes Historical Provider, MD  clonazePAM (KLONOPIN) 0.5 MG tablet Place 1 tablet (0.5 mg total) into feeding tube 3 (three) times daily as needed for  anxiety. 10/08/15  Yes Erick Blinks, MD  cloNIDine (CATAPRES - DOSED IN MG/24 HR) 0.3 mg/24hr patch Place 1 patch (0.3 mg total) onto the skin once a week. 10/16/15  Yes Kerri Perches, MD  clopidogrel (PLAVIX) 75 MG tablet Place 1 tablet (75 mg total) into feeding tube daily. 10/16/15  Yes Kerri Perches, MD  diclofenac sodium (VOLTAREN) 1 % GEL Apply twice daily, as needed to both knees, for pain and swelling Patient taking differently: Apply 4 g topically 2 (two) times daily as needed. Apply twice daily, as needed to both knees, for pain and  swelling 10/16/15  Yes Kerri Perches, MD  DULoxetine (CYMBALTA) 60 MG capsule Take 60 mg by mouth daily. 11/10/15  Yes Historical Provider, MD  hydrALAZINE (APRESOLINE) 25 MG tablet Place 1 tablet (25 mg total) into feeding tube 3 (three) times daily. 11/06/15  Yes Kerri Perches, MD  levETIRAcetam (KEPPRA) 100 MG/ML solution Place 10 mLs (1,000 mg total) into feeding tube 2 (two) times daily. 09/09/15  Yes Standley Brooking, MD  levothyroxine (SYNTHROID, LEVOTHROID) 75 MCG tablet Take 0.5-1 tablets (37.5-75 mcg total) by mouth daily before breakfast. take one half tablet sat and sun and 1 tab mon- fri 10/08/15  Yes Erick Blinks, MD  lovastatin (MEVACOR) 40 MG tablet Place 1 tablet (40 mg total) into feeding tube at bedtime. 10/16/15  Yes Kerri Perches, MD  meclizine (ANTIVERT) 25 MG tablet Take 1 tablet (25 mg total) by mouth 3 (three) times daily as needed for dizziness. 10/16/15  Yes Kerri Perches, MD  metroNIDAZOLE (FLAGYL) 500 MG tablet Place 1 tablet (500 mg total) into feeding tube 2 (two) times daily. 11/29/15  Yes Derwood Kaplan, MD  Nutritional Supplements (FEEDING SUPPLEMENT, JEVITY 1.5 CAL,) LIQD 237 mLs by Feeding route 5 (five) times daily.   Yes Historical Provider, MD  ondansetron (ZOFRAN) 4 MG/5ML solution Take 5 mLs (4 mg total) by mouth every 8 (eight) hours as needed for nausea or vomiting. Patient taking differently: 4 mg by Feeding Tube route every 8 (eight) hours as needed for nausea or vomiting.  11/30/15  Yes Kerri Perches, MD  polyethylene glycol [facility] Ethelene Hal) packet Take 17 g by mouth daily as needed for mild constipation or moderate constipation.    Yes Historical Provider, MD  potassium chloride SA (K-DUR,KLOR-CON) 20 MEQ tablet Take 1 tablet (20 mEq total) by mouth once. Patient taking differently: Take 20 mEq by mouth daily.  10/17/15  Yes Thomas S Kefalas, PA-C  PROAIR HFA 108 (90 BASE) MCG/ACT inhaler Inhale 1-2 puffs into the lungs every 6  (six) hours as needed for wheezing or shortness of breath.  08/31/15  Yes Historical Provider, MD  prochlorperazine (COMPAZINE) 10 MG tablet 10 mg by Feeding Tube route every 6 (six) hours as needed for nausea or vomiting.    Yes Historical Provider, MD  ranitidine (ZANTAC) 150 MG/10ML syrup Place 10 mLs (150 mg total) into feeding tube daily. 10/16/15  Yes Kerri Perches, MD  VIMPAT 10 MG/ML oral solution 150 mg by Feeding Tube route 2 (two) times daily.  12/01/15  Yes Historical Provider, MD  Water For Irrigation, Sterile (FREE WATER) SOLN Place 100 mLs into feeding tube every 4 (four) hours. 10/01/14  Yes Ripudeep Jenna Luo, MD  acetaminophen (TYLENOL) 650 MG CR tablet 650 mg by Feeding Tube route every 8 (eight) hours as needed for pain.     Historical Provider, MD     Allergies:  Allergies  Allergen Reactions  . Morphine Nausea And Vomiting    Social History:   reports that she has never smoked. She has never used smokeless tobacco. She reports that she does not drink alcohol or use illicit drugs.  Family History: Family History  Problem Relation Age of Onset  . Heart disease Mother 21  . Hypertension Mother   . Colon cancer Father   . Hypertension Sister     x2  . Diabetes Sister     x2  . Heart failure Sister   . Lupus Brother      Physical Exam: Filed Vitals:   12/05/15 0130 12/05/15 0200 12/05/15 0230 12/05/15 0245  BP: 127/83 128/52 133/49   Pulse:   87 84  Temp:      TempSrc:      Resp:      SpO2:   99% 98%   Blood pressure 133/49, pulse 84, temperature 102.6 F (39.2 C), temperature source Oral, resp. rate 18, SpO2 98 %.  GEN: Pleasant patient lying in the stretcher in no acute distress; PSYCH: Not interacting with her environment.  does not appear anxious or depressed; affect is appropriate. HEENT: Mucous membranes pink and anicteric; PERRLA; EOM intact; no cervical lymphadenopathy nor thyromegaly or carotid bruit; no JVD; There were no stridor. Neck is  very supple. Breasts:: Not examined CHEST WALL: No tenderness CHEST: Normal respiration, clear to auscultation bilaterally.  HEART: Regular rate and rhythm. There are no murmur, rub, or gallops.  BACK: No kyphosis or scoliosis; no CVA tenderness ABDOMEN: soft and non-tender; no masses, no organomegaly, normal abdominal bowel sounds; no pannus; no intertriginous candida. There is no rebound and no distention. Rectal Exam: Not done EXTREMITIES: No bone or joint deformity; age-appropriate arthropathy of the hands and knees; no edema; no ulcerations. There is no calf tenderness. Genitalia: not examined PULSES: 2+ and symmetric SKIN: Normal hydration no rash or ulceration [id number] dense hemplegia, left hemiparesis.    Labs on Admission:  Basic Metabolic Panel:  Recent Labs Lab 11/29/15 2105 01/01/16 2241  NA 137 134*  K 4.1 4.0  CL 103 100*  CO2 29 28  GLUCOSE 90 107*  BUN 23* 16  CREATININE 0.78 0.76  CALCIUM 9.4 9.2   Liver Function Tests:  Recent Labs Lab 2016/01/01 2241  AST 19  ALT 18  ALKPHOS 43  BILITOT 0.4  PROT 7.9  ALBUMIN 2.8*   CBC:  Recent Labs Lab 11/29/15 2105 01/01/16 2241  WBC 6.9 12.5*  NEUTROABS 3.9 9.6*  HGB 11.7* 11.2*  HCT 33.2* 31.9*  MCV 94.3 93.5  PLT 209 296   Radiological Exams on Admission: Dg Chest 2 View  01/01/2016  CLINICAL DATA:  Fever and vomiting. EXAM: CHEST  2 VIEW COMPARISON:  11/29/15 FINDINGS: Low lateral view is essentially nondiagnostic secondary to patient positioning, including the arm overlying the chest. Midline trachea. Moderate cardiomegaly. Possible small left pleural effusion. No pneumothorax. No overt congestive failure. Mild right base atelectasis. Left base airspace disease. There may be mild pulmonary venous congestion, superimposed upon low lung volumes. IMPRESSION: Left base airspace disease is suspicious for residual or recurrent infection/ aspiration. Cardiomegaly and low lung volumes. Cannot exclude  mild pulmonary venous congestion Electronically Signed   By: Jeronimo Greaves M.D.   On: 01/01/2016 22:13    EKG: Independently reviewed.    Assessment/Plan Present on Admission:  . HCAP (healthcare-associated pneumonia) . Bence Jones proteinuria . CKD (chronic kidney disease) stage 3, GFR 30-59 ml/min . Pulmonary  fibrosis (HCC) . Hypothyroid . HLD (hyperlipidemia) . Obesity . Multiple myeloma (HCC)  PLAN: Patient will be admitted for HCAP.  She has been strict NPO with TF, and will be on Van/Zosyn for HCAP, though I cannot completely exclude aspiration.  Her hx of CKD is actually has improved, as her Cr is only 0.78 For her seizure,  I will continue her  two anticonsulsive medications. For her hypothyrodism, will continue with her supplement thru TF. For her HTN, will give her home meds.  She gets it through her PEG.  She is placed on strict NPO as before, and continue with her Jevity 1.5, 5 cans per day. I spoke with her daughters  at her bedside, and confirmed her DNR code status. Thank you for allowing me to participate in the care of your patient. Good Day.   Other plans as per orders.  Code Status: DNR.    Houston Siren, MD. [facility] Pager [phone] 7pm to 7am.  12/05/2015, 3:08 AM

## 2015-12-05 NOTE — Progress Notes (Signed)
PT Cancellation Note  Patient Details Name: Melissa [name] MRN: [id number] DOB: 1946/04/06   Cancelled Treatment:    Reason Eval/Treat Not Completed: Other (comment).  I spoke with pt's daughter who stated that they are not having any problems managing pt at home.  She is total care and a hoyer lift is being used to transfer her bed to chair.  Her daughter stated that pt was supposed to have a speech therapy consult to evaluate her swallowing.  There is currently no order entered.  I will speak with RN or MD to insure that this order is made.   Demetrios Isaacs L PT 12/05/2015, 9:49 AM [phone]

## 2015-12-06 ENCOUNTER — Inpatient Hospital Stay (HOSPITAL_COMMUNITY): Payer: Medicare Other

## 2015-12-06 LAB — BASIC METABOLIC PANEL
ANION GAP: 3 — AB (ref 5–15)
BUN: 15 mg/dL (ref 6–20)
CALCIUM: 8.9 mg/dL (ref 8.9–10.3)
CO2: 27 mmol/L (ref 22–32)
Chloride: 106 mmol/L (ref 101–111)
Creatinine, Ser: 0.66 mg/dL (ref 0.44–1.00)
GLUCOSE: 89 mg/dL (ref 65–99)
POTASSIUM: 3.8 mmol/L (ref 3.5–5.1)
SODIUM: 136 mmol/L (ref 135–145)

## 2015-12-06 LAB — CBC
HCT: 29.7 % — ABNORMAL LOW (ref 36.0–46.0)
Hemoglobin: 10.2 g/dL — ABNORMAL LOW (ref 12.0–15.0)
MCH: 32 pg (ref 26.0–34.0)
MCHC: 34.3 g/dL (ref 30.0–36.0)
MCV: 93.1 fL (ref 78.0–100.0)
PLATELETS: 268 10*3/uL (ref 150–400)
RBC: 3.19 MIL/uL — AB (ref 3.87–5.11)
RDW: 15.2 % (ref 11.5–15.5)
WBC: 10.1 10*3/uL (ref 4.0–10.5)

## 2015-12-06 MED ORDER — SODIUM CHLORIDE 0.9 % IV BOLUS (SEPSIS)
500.0000 mL | Freq: Once | INTRAVENOUS | Status: AC
  Administered 2015-12-06: 500 mL via INTRAVENOUS

## 2015-12-06 MED ORDER — HYDRALAZINE HCL 20 MG/ML IJ SOLN
10.0000 mg | Freq: Once | INTRAMUSCULAR | Status: AC
  Administered 2015-12-07: 10 mg via INTRAVENOUS
  Filled 2015-12-06: qty 1

## 2015-12-06 MED ORDER — LEVALBUTEROL HCL 0.63 MG/3ML IN NEBU
0.6300 mg | INHALATION_SOLUTION | Freq: Two times a day (BID) | RESPIRATORY_TRACT | Status: DC
  Administered 2015-12-06 – 2015-12-10 (×8): 0.63 mg via RESPIRATORY_TRACT
  Filled 2015-12-06 (×9): qty 3

## 2015-12-06 MED ORDER — CLINDAMYCIN HCL 150 MG PO CAPS
300.0000 mg | ORAL_CAPSULE | Freq: Four times a day (QID) | ORAL | Status: DC
  Administered 2015-12-06 – 2015-12-07 (×4): 300 mg via ORAL
  Filled 2015-12-06 (×5): qty 2

## 2015-12-06 NOTE — Care Management Note (Signed)
Case Management Note  Patient Details  Name: Melissa [name] MRN: [id number] Date of Birth: [birth year]  Subjective/Objective:                  Pt is from home, lives with her daughter who is her primary care giver. Pt is dependent with activities. Pt TF and mobility DME at home prior to admission. Pt has neb machine at home and has had new machine and suction ordered by her PCP but it has no been delivered. DME is coming from [facility]. Pt has had [facility] in the past for [facility] services.   Action/Plan: Anticipate pt will return home with care being provided by daughter. Do not anticipate pt will need HH at DC. Romualdo Bolk, of [facility], asked about neb and suction that was ordered prior to admission, she will look into it. Will cont to follow for DC planning.   Expected Discharge Date:     12/07/2015             Expected Discharge Plan:  Home/Self Care  In-House Referral:  NA  Discharge planning Services  CM Consult  Post Acute Care Choice:  NA Choice offered to:  NA  DME Arranged:    DME Agency:     HH Arranged:    HH Agency:     Status of Service:  In process, will continue to follow  Medicare Important Message Given:    Date Medicare IM Given:    Medicare IM give by:    Date Additional Medicare IM Given:    Additional Medicare Important Message give by:     If discussed at [place] of Stay Meetings, dates discussed:    Additional Comments:  Sherald Barge, RN 12/06/2015, 1:30 PM

## 2015-12-06 NOTE — Progress Notes (Signed)
[facility] PROGRESS NOTE  Melissa [name] WJX:[id number] DOB: 28-Jul-1946 DOA: 12/21/2015 PCP: [name], MD  Assessment/Plan: Aspiration pneumonia -Do not believe this represents healthcare associated pneumonia. -This is patient's third episode in 3 months. -She is currently afebrile, leukocytosis has resolved. -We'll elect to discontinue broad-spectrum antibiotics and transition over to clindamycin and treat for approximately 5 more days. This can be administered via PEG tube. -If she represents with aspiration pneumonia may be beneficial to obtain a palliative care consultation given repeated admissions for the same and what is perceived to be poor quality of life at baseline. -Speech consult requested per daughters wish.  History of CVA -Bedbound with right hemiplegia, dysphagia and a fascia. -Continue Plavix and statin. -Continue tube feeds.  Chronic kidney disease stage III -At baseline.  Hypothyroidism -TSH within normal limits, continue home dose of Synthroid.  Hyperlipidemia -Continue statin.  Hypertension -Fair control, continue home medications, do not anticipate any medication changes.  Code Status: DO NOT RESUSCITATE Family Communication: Daughter [name] at bedside updated on plan of care  Disposition Plan: Likely home in 24-48 hours   Consultants:  None   Antibiotics:  Clindamycin   Subjective: Fixed stare, unable to verbalize or communicate  Objective: Filed Vitals:   12/05/15 1934 12/05/15 2100 12/06/15 0650 12/06/15 0736  BP:  158/61 160/70   Pulse: 91 94 100   Temp:  98.6 F (37 C) 99.4 F (37.4 C)   TempSrc:  Axillary Axillary   Resp:  22 20   Height:      Weight:      SpO2: 98% 98% 100% 98%    Intake/Output Summary (Last 24 hours) at 12/06/15 1116 Last data filed at 12/05/15 1746  Gross per 24 hour  Intake    337 ml  Output      0 ml  Net    337 ml   Filed Weights   12/05/15 0511  Weight: 82.555 kg (182  lb)    Exam:   General:  Awake, unable to assess orientation  Cardiovascular: Regular rate and rhythm, no murmurs, rubs or gallops  Respiratory: Clear to auscultation bilaterally  Abdomen: Soft, nontender, nondistended, positive bowel sounds  Extremities: 1+ pitting edema bilaterally   Neurologic:  Right hemiplegia, unable to fully assess given current mental state  Data Reviewed: Basic Metabolic Panel:  Recent Labs Lab 11/29/15 2105 11/24/2015 2241 12/05/15 0550 12/06/15 0523  NA 137 134* 136 136  K 4.1 4.0 4.0 3.8  CL 103 100* 105 106  CO2 29 28 28 27   GLUCOSE 90 107* 100* 89  BUN 23* 16 16 15   CREATININE 0.78 0.76 0.72 0.66  CALCIUM 9.4 9.2 9.0 8.9   Liver Function Tests:  Recent Labs Lab 11/30/2015 2241  AST 19  ALT 18  ALKPHOS 43  BILITOT 0.4  PROT 7.9  ALBUMIN 2.8*   No results for input(s): LIPASE, AMYLASE in the last 168 hours. No results for input(s): AMMONIA in the last 168 hours. CBC:  Recent Labs Lab 11/29/15 2105 11/28/2015 2241 12/05/15 0550 12/06/15 0523  WBC 6.9 12.5* 10.6* 10.1  NEUTROABS 3.9 9.6*  --   --   HGB 11.7* 11.2* 10.6* 10.2*  HCT 33.2* 31.9* 31.0* 29.7*  MCV 94.3 93.5 94.2 93.1  PLT 209 296 285 268   Cardiac Enzymes: No results for input(s): CKTOTAL, CKMB, CKMBINDEX, TROPONINI in the last 168 hours. BNP (last 3 results)  Recent Labs  11/29/15 2105  BNP 816.0*  ProBNP (last 3 results) No results for input(s): PROBNP in the last 8760 hours.  CBG: No results for input(s): GLUCAP in the last 168 hours.  Recent Results (from the past 240 hour(s))  Urine culture     Status: None   Collection Time: 11/29/15  9:50 PM  Result Value Ref Range Status   Specimen Description URINE, CATHETERIZED  Final   Special Requests NONE  Final   Culture   Final    7,000 COLONIES/mL INSIGNIFICANT GROWTH Performed at [facility]    Report Status 12/01/2015 FINAL  Final  Blood culture (routine x 2)     Status: None  (Preliminary result)   Collection Time: 12/05/15  1:07 AM  Result Value Ref Range Status   Specimen Description BLOOD RIGHT HAND  Final   Special Requests BOTTLES DRAWN AEROBIC AND ANAEROBIC 8CC EACH  Final   Culture NO GROWTH 1 DAY  Final   Report Status PENDING  Incomplete  Blood culture (routine x 2)     Status: None (Preliminary result)   Collection Time: 12/05/15  1:20 AM  Result Value Ref Range Status   Specimen Description BLOOD RIGHT HAND  Final   Special Requests   Final    BOTTLES DRAWN AEROBIC AND ANAEROBIC AEB=8CC ANA=6CC   Culture NO GROWTH 1 DAY  Final   Report Status PENDING  Incomplete     Studies: Dg Chest 2 View  12/17/2015  CLINICAL DATA:  Fever and vomiting. EXAM: CHEST  2 VIEW COMPARISON:  11/29/15 FINDINGS: Low lateral view is essentially nondiagnostic secondary to patient positioning, including the arm overlying the chest. Midline trachea. Moderate cardiomegaly. Possible small left pleural effusion. No pneumothorax. No overt congestive failure. Mild right base atelectasis. Left base airspace disease. There may be mild pulmonary venous congestion, superimposed upon low lung volumes. IMPRESSION: Left base airspace disease is suspicious for residual or recurrent infection/ aspiration. Cardiomegaly and low lung volumes. Cannot exclude mild pulmonary venous congestion Electronically Signed   By: Jeronimo Greaves M.D.   On: 12/16/2015 22:13    Scheduled Meds: . acetaminophen  650 mg Per Tube 3 times per day  . albuterol  2.5 mg Nebulization BID  . amLODipine  10 mg Per Tube Daily  . carvedilol  6.25 mg Per Tube BID WC  . clindamycin  300 mg Oral 4 times per day  . cloNIDine  0.3 mg Transdermal Weekly  . clopidogrel  75 mg Per Tube Daily  . DULoxetine  60 mg Oral Daily  . feeding supplement (JEVITY 1.5 CAL/FIBER)  237 mL Per Tube 5 X Daily  . free water  100 mL Per Tube 6 times per day  . heparin  5,000 Units Subcutaneous 3 times per day  . hydrALAZINE  25 mg Per Tube  TID  . lacosamide  150 mg Per Tube BID  . levETIRAcetam  1,000 mg Per Tube BID  . [START ON 12/09/2015] levothyroxine  37.5 mcg Oral Once per day on Sun Sat  . levothyroxine  75 mcg Per Tube Once per day on Mon Tue Wed Thu Fri  . pantoprazole sodium  40 mg Per Tube Daily  . pravastatin  40 mg Per Tube q1800  . ranitidine  150 mg Per Tube Daily   Continuous Infusions: . sodium chloride 50 mL/hr at 12/05/15 2103    Principal Problem:   HCAP (healthcare-associated pneumonia) Active Problems:   Hypothyroid   Obesity   Pulmonary fibrosis (HCC)   Bence Jones proteinuria  CKD (chronic kidney disease) stage 3, GFR 30-59 ml/min   Hemiplegia affecting right dominant side (HCC)   Multiple myeloma (HCC)   HLD (hyperlipidemia)   Seizure disorder (HCC)   PEG (percutaneous endoscopic gastrostomy) status (HCC)   Pressure ulcer    Time spent: 25 minutes. Greater than 50% of this time was spent in direct contact with the patient coordinating care.    Chaya Jan  [facility] Pager [phone]  If 7PM-7AM, please contact night-coverage at [email or web address], password [facility] 12/06/2015, 11:16 AM  LOS: 1 day

## 2015-12-06 NOTE — Clinical Documentation Improvement (Signed)
Hospitalist  Please update your documentation within the medical record to reflect your response to this query. Thank you   Can the diagnosis of pressure ulcer be further specified?   Document if pressure ulcer with stage is Present on Admission   Document Site with laterality - Elbow, Back (upper/lower), Sacral, Hip, Buttock, Ankle, Heel, Head, Other (Specify)  Pressure Ulcer Stage - Stage1, Stage 2, Stage 3, Stage 4, Unstageable, Unspecified, Unable to Clinically Determine  Document any associated diagnoses/conditions such as: with gangrene  Other  Clinically Undetermined  Supporting Information: 12/05/15 progr note.Marland KitchenMarland Kitchen"Pressure ulcer"...  Please exercise your independent, professional judgment when responding. A specific answer is not anticipated or expected.  Thank You,  Ermelinda Das, RN, BSN, [place] Certified Clinical Documentation Specialist [place]: Health Information Management [phone]

## 2015-12-06 NOTE — Progress Notes (Signed)
MBS report now available under the imaging section of the notes  Maija Biggers Sumney MA, CCC-SLP Acute Care Speech Language Pathologist    

## 2015-12-06 NOTE — Evaluation (Signed)
Clinical/Bedside Swallow Evaluation Patient Details  Name: Melissa [name] MRN: [id number] Date of Birth: 09-09-1946  Today's Date: 12/06/2015 Time: SLP Start Time (ACUTE ONLY): 1506 SLP Stop Time (ACUTE ONLY): 1536 SLP Time Calculation (min) (ACUTE ONLY): 30 min  Past Medical History:  Past Medical History  Diagnosis Date  . Vertigo, intermittent   . Hypothyroidism   . Hyperlipidemia   . Obesity   . Hypertension     severe and resistant to treatment; 2008-negative left renal angiogram  . Vitiligo   . Degenerative disc disease, lumbar   . Degenerative disc disease, cervical   . Chest pain 2008    2008-normal coronary angiography  . Stroke (HCC) 09/18/2014    right hemiparesis  . Renal cell carcinoma     Right nephrectomy  . Multiple myeloma (HCC) 08/26/2014  . History of pneumonia   . Emphysema of lung (HCC)   . CVA (cerebral infarction) 06/2015   Past Surgical History:  Past Surgical History  Procedure Laterality Date  . Tubal ligation  1983    Bilateral  . Nephrectomy      Right; secondary to renal cell carcinoma  . Colonoscopy N/A 04/21/2014    Procedure: COLONOSCOPY;  Surgeon: Malissa Hippo, MD;  [facility];  Service: Endoscopy;  Laterality: N/A;  . Colonoscopy N/A 04/21/2014    Procedure: COLONOSCOPY;  Surgeon: Malissa Hippo, MD;  Location: [facility];  Service: Endoscopy;  Laterality: N/A;  1030  . Tee without cardioversion N/A 09/23/2014    Procedure: TRANSESOPHAGEAL ECHOCARDIOGRAM (TEE);  Surgeon: Thurmon Fair, MD;  Location: [facility] ENDOSCOPY;  Service: Cardiovascular;  Laterality: N/A;  . Peg tube placement  09/2014   HPI:  Pt is a 69 y.o. 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 Hx of recurrent PNA    Assessment / Plan / Recommendation Clinical Impression  Pt alert this date for PO trials with daughter present at bedside. No prior objective swallow study on file though medical hx signficant for prior large CVA and seizures.  Daughter reports poor mentation following hospitalization seizing activity being the biggest limiting factor yet states pt is typically alert at home. Since PEG placement pt with multiple hospitalizations for PNA despite NPO status.  Noted poor oral hygiene which improved with extensive oral care by SLP. Pt responsive to puree trials with suspected delay in swallow initiation. Delayed couging noted upon first PO admisitration however as trials progressed no overt signs or symptoms of aspiration were noted.   Generalized oral motor weakness apparent. Recommend instrumental swallow study to determine current safest least restrictive diet. MBS planned this date.      Aspiration Risk  Moderate aspiration risk    Diet Recommendation     Medication Administration: Via alternative means    Other  Recommendations Oral Care Recommendations: Oral care QID   Follow up Recommendations       Frequency and Duration min 2x/week  1 week       Prognosis Prognosis for Safe Diet Advancement: Fair      Swallow Study   General Date of Onset: 08-Dec-2015 HPI: Pt is a 69 y.o. 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 Hx of recurrent PNA  Type of Study: Bedside Swallow  Evaluation Diet Prior to this Study: NPO Temperature Spikes Noted: Yes Respiratory Status: Room air History of Recent Intubation: No Behavior/Cognition: Alert;Requires cueing Oral Cavity Assessment: Dried secretions;Erythema;Dry Oral Care Completed by SLP: Yes Oral Cavity - Dentition: Poor condition;Missing dentition Self-Feeding Abilities: Total assist Patient Positioning: Upright in bed Baseline Vocal Quality: Other (comment);Low vocal intensity (minimal vocalizations 2/2 aphasic hx ) Volitional Cough: Weak;Congested Volitional Swallow: Unable to elicit    Oral/Motor/Sensory Function Overall Oral Motor/Sensory Function: Generalized oral weakness   Ice Chips Ice chips: Not tested   Thin Liquid Thin Liquid: Not tested    Nectar Thick Nectar Thick Liquid: Not tested   Honey Thick Honey Thick Liquid: Not tested   Puree Puree: Impaired Presentation: Spoon Oral Phase Impairments: Reduced lingual movement/coordination Oral Phase Functional Implications: Prolonged oral transit Pharyngeal Phase Impairments: Suspected delayed Swallow;Cough - Delayed;Multiple swallows;Throat Clearing - Delayed   Solid Solid: Not tested      Marcene Duos MA, CCC-SLP Acute Care Speech Language Pathologist    Marcene Duos E 12/06/2015,3:55 PM

## 2015-12-07 ENCOUNTER — Other Ambulatory Visit: Payer: Self-pay | Admitting: *Deleted

## 2015-12-07 ENCOUNTER — Inpatient Hospital Stay (HOSPITAL_COMMUNITY): Payer: Medicare Other

## 2015-12-07 DIAGNOSIS — Z515 Encounter for palliative care: Secondary | ICD-10-CM

## 2015-12-07 DIAGNOSIS — G8191 Hemiplegia, unspecified affecting right dominant side: Secondary | ICD-10-CM

## 2015-12-07 DIAGNOSIS — Z7189 Other specified counseling: Secondary | ICD-10-CM | POA: Insufficient documentation

## 2015-12-07 DIAGNOSIS — J690 Pneumonitis due to inhalation of food and vomit: Principal | ICD-10-CM | POA: Insufficient documentation

## 2015-12-07 DIAGNOSIS — J189 Pneumonia, unspecified organism: Secondary | ICD-10-CM

## 2015-12-07 LAB — CBC
HEMATOCRIT: 32.4 % — AB (ref 36.0–46.0)
Hemoglobin: 11.4 g/dL — ABNORMAL LOW (ref 12.0–15.0)
MCH: 32.7 pg (ref 26.0–34.0)
MCHC: 35.2 g/dL (ref 30.0–36.0)
MCV: 92.8 fL (ref 78.0–100.0)
Platelets: 342 10*3/uL (ref 150–400)
RBC: 3.49 MIL/uL — ABNORMAL LOW (ref 3.87–5.11)
RDW: 15.2 % (ref 11.5–15.5)
WBC: 11.5 10*3/uL — ABNORMAL HIGH (ref 4.0–10.5)

## 2015-12-07 LAB — BASIC METABOLIC PANEL
ANION GAP: 9 (ref 5–15)
BUN: 13 mg/dL (ref 6–20)
CALCIUM: 9.5 mg/dL (ref 8.9–10.3)
CO2: 26 mmol/L (ref 22–32)
Chloride: 102 mmol/L (ref 101–111)
Creatinine, Ser: 0.68 mg/dL (ref 0.44–1.00)
GFR calc Af Amer: >60 mL/min (ref >60)
GFR calc non Af Amer: >60 mL/min (ref >60)
GLUCOSE: 146 mg/dL — AB (ref 65–99)
Potassium: 3.8 mmol/L (ref 3.5–5.1)
Sodium: 137 mmol/L (ref 135–145)

## 2015-12-07 LAB — GLUCOSE, CAPILLARY: Glucose-Capillary: 122 mg/dL — ABNORMAL HIGH (ref 65–99)

## 2015-12-07 LAB — BRAIN NATRIURETIC PEPTIDE: B Natriuretic Peptide: 658 pg/mL — ABNORMAL HIGH (ref 0.0–100.0)

## 2015-12-07 MED ORDER — LORAZEPAM 2 MG/ML IJ SOLN
0.5000 mg | Freq: Four times a day (QID) | INTRAMUSCULAR | Status: DC | PRN
  Administered 2015-12-07 – 2015-12-10 (×8): 0.5 mg via INTRAVENOUS
  Filled 2015-12-07 (×8): qty 1

## 2015-12-07 MED ORDER — SODIUM CHLORIDE 0.9 % IV SOLN
3.0000 g | Freq: Three times a day (TID) | INTRAVENOUS | Status: DC
  Administered 2015-12-07 – 2015-12-10 (×10): 3 g via INTRAVENOUS
  Filled 2015-12-07 (×13): qty 3

## 2015-12-07 MED ORDER — MORPHINE SULFATE (CONCENTRATE) 10 MG /0.5 ML PO SOLN
2.5000 mg | ORAL | Status: DC | PRN
  Administered 2015-12-07 – 2015-12-10 (×13): 2.6 mg
  Filled 2015-12-07 (×14): qty 0.5

## 2015-12-07 MED ORDER — LORAZEPAM 2 MG/ML IJ SOLN
0.5000 mg | Freq: Once | INTRAMUSCULAR | Status: AC
  Administered 2015-12-07: 0.5 mg via INTRAVENOUS
  Filled 2015-12-07: qty 1

## 2015-12-07 MED ORDER — FUROSEMIDE 10 MG/ML IJ SOLN
40.0000 mg | INTRAMUSCULAR | Status: AC
  Administered 2015-12-07: 40 mg via INTRAVENOUS
  Filled 2015-12-07: qty 4

## 2015-12-07 NOTE — Progress Notes (Signed)
Called by RN to evaluate the patient for restlessness and shortness of breath. Patient currently was given IV fluid bolus 500 normal saline by the nurse practitioner around 9 PM, she also received Xopenex nebulizer treatment. On examination patient is restless, O2 sats 99% on oxygen via nasal cannula Chest-bilateral wheezing  Assessment Likely diastolic heart failure exacerbation from fluid bolus, patient has history of grade 1 diastolic dysfunction as per echo We'll give 1 dose of Lasix 40 daily grams IV, check BNP, change IV fluids to [facility]

## 2015-12-07 NOTE — Progress Notes (Signed)
ANTIBIOTIC CONSULT NOTE - INITIAL  Pharmacy Consult for Unasyn Indication: Aspiration Pneumonia  Allergies  Allergen Reactions  . Morphine Nausea And Vomiting    Patient Measurements: Height: _0  (167.6 cm) Weight: 182 lb (82.555 kg) IBW/kg (Calculated) : 59.3  Vital Signs: Temp: 98.7 F (37.1 C) (12/15 0737) Temp Source: Axillary (12/15 0737) BP: 161/78 mmHg (12/15 0941) Pulse Rate: 55 (12/15 0737) Intake/Output from previous day: 12/14 0701 - 12/15 0700 In: 2338.2 [I.V.:1664.2; NG/GT:674] Out: -  Intake/Output from this shift:    Labs:  Recent Labs  12/05/15 0550 12/06/15 0523 12/07/15 0200  WBC 10.6* 10.1 11.5*  HGB 10.6* 10.2* 11.4*  PLT 285 268 342  CREATININE 0.72 0.66 0.68   Estimated Creatinine Clearance: 71.9 mL/min (by C-G formula based on Cr of 0.68). No results for input(s): VANCOTROUGH, VANCOPEAK, VANCORANDOM, GENTTROUGH, GENTPEAK, GENTRANDOM, TOBRATROUGH, TOBRAPEAK, TOBRARND, AMIKACINPEAK, AMIKACINTROU, AMIKACIN in the last 72 hours.   Microbiology: Recent Results (from the past 720 hour(s))  Urine culture     Status: None   Collection Time: 11/29/15  9:50 PM  Result Value Ref Range Status   Specimen Description URINE, CATHETERIZED  Final   Special Requests NONE  Final   Culture   Final    7,000 COLONIES/mL INSIGNIFICANT GROWTH Performed at [facility]    Report Status 12/01/2015 FINAL  Final  Blood culture (routine x 2)     Status: None (Preliminary result)   Collection Time: 12/05/15  1:07 AM  Result Value Ref Range Status   Specimen Description BLOOD RIGHT HAND  Final   Special Requests BOTTLES DRAWN AEROBIC AND ANAEROBIC 8CC EACH  Final   Culture NO GROWTH 2 DAYS  Final   Report Status PENDING  Incomplete  Blood culture (routine x 2)     Status: None (Preliminary result)   Collection Time: 12/05/15  1:20 AM  Result Value Ref Range Status   Specimen Description BLOOD RIGHT HAND  Final   Special Requests   Final    BOTTLES  DRAWN AEROBIC AND ANAEROBIC AEB=8CC ANA=6CC   Culture NO GROWTH 2 DAYS  Final   Report Status PENDING  Incomplete    Medical History: Past Medical History  Diagnosis Date  . Vertigo, intermittent   . Hypothyroidism   . Hyperlipidemia   . Obesity   . Hypertension     severe and resistant to treatment; 2008-negative left renal angiogram  . Vitiligo   . Degenerative disc disease, lumbar   . Degenerative disc disease, cervical   . Chest pain 2008    2008-normal coronary angiography  . Stroke ([place]) 09/18/2014    right hemiparesis  . Renal cell carcinoma     Right nephrectomy  . Multiple myeloma ([place]) 08/26/2014  . History of pneumonia   . Emphysema of lung ([place])   . CVA (cerebral infarction) 06/2015    Medications:  Clindamycin 12/14>>12/15  Assessment: 69 y.o. 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 Work up in the ER with CXR showed LLL infiltrate unclear if new PNA or aspiration. She is currently afebrile, tmax 101.4. WBC 11.5. Patient has been diaphoretic  and has labored respirations. Change antibiotics to Unasyn for broader coverage of aspiration. Blood cx no growth to date. Chest xray still c/w RLL PNA.  Goal of Therapy:  resolution of infection  Plan:  Unasyn 3gm IV q8h Monitor V/S and labs Follow up culture results   Isac Sarna, BS Vena Austria, BCPS Clinical Pharmacist Pager [phone] 12/07/2015,11:41 AM

## 2015-12-07 NOTE — Consult Note (Signed)
Consultation Note Date: 12/07/2015   Patient Name: Melissa [name]  DOB: 10-21-1946  MRN: [id number]  Age / Sex: 69 y.o., female   PCP: Kerri Perches, MD Referring Physician: Micael Hampshire Acost*  Reason for Consultation: Establishing goals of care and Psychosocial/spiritual support  Palliative Care Assessment and Plan Summary of Established Goals of Care and Medical Treatment Preferences   Clinical Assessment/Narrative: Melissa [name] is resting in bed with her daughter/HCPOA [name] at her side.  She has marked work of breathing, and is unable to participate in our discussion today d/t stroke. [name] and I talk about her current health concerns including recurrent aspiration pneumonia.  I share that this is usually on right side, but dt Melissa [name] positioning (dt hemiplegia), recurring asp PNE is likely.  We talk about heart mechanics (electrical, blood flow, mechanical) and that there is no indication of HF at this time.    [name] shares that Melissa [name] now must use hoyer lift for transfers, and that she has been fed via PEG for quite some time.  We talk about the chronic illness trajectory and how immobility causes UTI, PNE, skin breakdown and these likely will continue to occur.  I share that they have been doing a great job in caring for Melissa [name] at home.   We talk about code status (DNR) and that this does not mean that she wont get treatment.  Dr. Ardyth Harps enters and share the chest xray picture with [name].  We talk about the difficulty in resolving asp PNE, as people continue to aspirate.  [name] is tearful and feels that she needs some time alone to process this.  After a break, we talk about the risks of increasing antibiotics (C-diff) and that we will do what she feels is best for Melissa [name], keeping her at the center of all decisions. [name] states she would like to try increased antibiotics.  I share that we will know if they will help in 24-48 hours.  [name]  has reached out to her sister who is on the way to the hospital for support.  We talk about the benefits of Morphine for WOB, and discuss the listed allergy (NV).  It is felt that the benefits of Morphine outweigh the risks.   Contacts/Participants in Discussion: Primary Decision Maker:  Melissa [name] is no longer able to make her own healthcare decisions.  HCPOA: yes  Daughter, Isaias Sakai is HCPOA,  Melissa [name] lives in daughter Gerrit Halls home.   Code Status/Advance Care Planning:  DNR  Symptom Management:   Morphine 2.5 mg PT Q 4 hours PRN  Palliative Prophylaxis: Miralax 17 G PT PRN   Psycho-social/Spiritual:   Support System: lives in daughter Gerrit Halls home. Daughter, Isaias Sakai is HCPOA,   Desire for further Chaplaincy support:  Not discussed today.   Prognosis: Unable to determine, likely limited d/t recurrent asp. PNE, and "white out" left lung.   Discharge Planning:  Dependant on outcomes.        Chief Complaint:  fever and coughs.  History of Present Illness:   Melissa [name] is an 69 y.o. 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 Work up in the ER with CXR showed LLL infiltrate unclear if new PNA or aspiration, leukocytosis with WBC of 12.5, but normal electrolytes and renal fx test. She has no respiratory distress. In the ER, she was given IV Van/Zosyn, and hospitalist was asked to admit her for PNA unclear HCAP or aspiration. She is a DNR.   Primary Diagnoses  Present on Admission:  . HCAP (healthcare-associated pneumonia) . Bence Jones proteinuria . CKD (chronic  kidney disease) stage 3, GFR 30-59 ml/min . Pulmonary fibrosis (HCC) . Hypothyroid . HLD (hyperlipidemia) . Obesity . Multiple myeloma (HCC)  Palliative Review of Systems: Melissa [name] is unable to participate in ROS d/t aphasia sp CVA.  I have reviewed the medical record, interviewed the patient and family, and examined the patient. The following aspects are pertinent.  Past Medical History  Diagnosis Date  . Vertigo, intermittent   . Hypothyroidism   . Hyperlipidemia   . Obesity   . Hypertension     severe and resistant to treatment; 2008-negative left renal angiogram  . Vitiligo   . Degenerative disc disease, lumbar   . Degenerative disc disease, cervical   . Chest pain 2008    2008-normal coronary angiography  . Stroke (HCC) 09/18/2014    right hemiparesis  . Renal cell carcinoma     Right nephrectomy  . Multiple myeloma (HCC) 08/26/2014  . History of pneumonia   . Emphysema of lung (HCC)   . CVA (cerebral infarction) 06/2015   Social History   Social History  . Marital Status: Divorced    Spouse Name: N/A  . Number of Children: 2  . Years of Education: N/A   Occupational History  . Disabled    Social History Main Topics  . Smoking status: Never Smoker   . Smokeless tobacco: Never Used  . Alcohol Use: No  . Drug Use: No  . Sexual Activity: No   Other Topics Concern  . None   Social History Narrative   Three grandchildren.   Patient is left handed.   Patient is disabled.      Family History  Problem Relation Age of Onset  . Heart disease Mother 30  . Hypertension Mother   . Colon cancer Father   . Hypertension Sister     x2  . Diabetes Sister     x2  . Heart failure Sister   . Lupus Brother    Scheduled Meds: . acetaminophen  650 mg Per Tube 3 times per day  . amLODipine  10 mg Per Tube Daily  . carvedilol  6.25 mg Per Tube BID WC  . cloNIDine  0.3 mg Transdermal Weekly  . clopidogrel  75 mg Per Tube Daily  . DULoxetine  60 mg Oral Daily    . feeding supplement (JEVITY 1.5 CAL/FIBER)  237 mL Per Tube 5 X Daily  . free water  100 mL Per Tube 6 times per day  . heparin  5,000 Units Subcutaneous 3 times per day  . hydrALAZINE  25 mg Per Tube TID  . lacosamide  150 mg Per Tube BID  . levalbuterol  0.63 mg Nebulization BID  . levETIRAcetam  1,000 mg Per Tube BID  . [START ON 12/09/2015] levothyroxine  37.5 mcg Oral Once per day on Sun Sat  . levothyroxine  75 mcg Per Tube Once per day on Mon Tue Wed Thu Fri  . pantoprazole sodium  40 mg Per Tube  Daily  . pravastatin  40 mg Per Tube q1800  . ranitidine  150 mg Per Tube Daily   Continuous Infusions: . sodium chloride 10 mL/hr at 12/07/15 0131   PRN Meds:.albuterol, polyethylene glycol, prochlorperazine Medications Prior to Admission:  Prior to Admission medications   Medication Sig Start Date End Date Taking? Authorizing Provider  albuterol (PROVENTIL) (2.5 MG/3ML) 0.083% nebulizer solution Take 3 mLs (2.5 mg total) by nebulization every 6 (six) hours as needed for wheezing or shortness of breath. Patient taking differently: Take 2.5 mg by nebulization 2 (two) times daily.  10/08/15  Yes Erick Blinks, MD  amLODipine (NORVASC) 10 MG tablet Place 1 tablet (10 mg total) into feeding tube daily. 09/09/15  Yes Standley Brooking, MD  amoxicillin-clavulanate (AUGMENTIN) 875-125 MG tablet Place 1 tablet into feeding tube 2 (two) times daily. 11/29/15  Yes Derwood Kaplan, MD  carvedilol (COREG) 6.25 MG tablet TAKE ONE TABLET INTO FEEDING TUBE TWICE DAILY WITH MEALS 06/29/15  Yes Kerri Perches, MD  cholecalciferol (VITAMIN D) 1000 UNITS tablet 1,000 Units by Feeding Tube route daily.   Yes Historical Provider, MD  clonazePAM (KLONOPIN) 0.5 MG tablet Place 1 tablet (0.5 mg total) into feeding tube 3 (three) times daily as needed for anxiety. 10/08/15  Yes Erick Blinks, MD  cloNIDine (CATAPRES - DOSED IN MG/24 HR) 0.3 mg/24hr patch Place 1 patch (0.3 mg total) onto the skin once a week.  10/16/15  Yes Kerri Perches, MD  clopidogrel (PLAVIX) 75 MG tablet Place 1 tablet (75 mg total) into feeding tube daily. 10/16/15  Yes Kerri Perches, MD  diclofenac sodium (VOLTAREN) 1 % GEL Apply twice daily, as needed to both knees, for pain and swelling Patient taking differently: Apply 4 g topically 2 (two) times daily as needed. Apply twice daily, as needed to both knees, for pain and swelling 10/16/15  Yes Kerri Perches, MD  DULoxetine (CYMBALTA) 60 MG capsule Take 60 mg by mouth daily. 11/10/15  Yes Historical Provider, MD  hydrALAZINE (APRESOLINE) 25 MG tablet Place 1 tablet (25 mg total) into feeding tube 3 (three) times daily. 11/06/15  Yes Kerri Perches, MD  levETIRAcetam (KEPPRA) 100 MG/ML solution Place 10 mLs (1,000 mg total) into feeding tube 2 (two) times daily. 09/09/15  Yes Standley Brooking, MD  levothyroxine (SYNTHROID, LEVOTHROID) 75 MCG tablet Take 0.5-1 tablets (37.5-75 mcg total) by mouth daily before breakfast. take one half tablet sat and sun and 1 tab mon- fri 10/08/15  Yes Erick Blinks, MD  lovastatin (MEVACOR) 40 MG tablet Place 1 tablet (40 mg total) into feeding tube at bedtime. 10/16/15  Yes Kerri Perches, MD  meclizine (ANTIVERT) 25 MG tablet Take 1 tablet (25 mg total) by mouth 3 (three) times daily as needed for dizziness. 10/16/15  Yes Kerri Perches, MD  metroNIDAZOLE (FLAGYL) 500 MG tablet Place 1 tablet (500 mg total) into feeding tube 2 (two) times daily. 11/29/15  Yes Derwood Kaplan, MD  Nutritional Supplements (FEEDING SUPPLEMENT, JEVITY 1.5 CAL,) LIQD 237 mLs by Feeding route 5 (five) times daily.   Yes Historical Provider, MD  ondansetron (ZOFRAN) 4 MG/5ML solution Take 5 mLs (4 mg total) by mouth every 8 (eight) hours as needed for nausea or vomiting. Patient taking differently: 4 mg by Feeding Tube route every 8 (eight) hours as needed for nausea or vomiting.  11/30/15  Yes Kerri Perches, MD  polyethylene glycol [facility] /  GLYCOLAX) packet Take 17 g by mouth daily  as needed for mild constipation or moderate constipation.    Yes Historical Provider, MD  potassium chloride SA (K-DUR,KLOR-CON) 20 MEQ tablet Take 1 tablet (20 mEq total) by mouth once. Patient taking differently: Take 20 mEq by mouth daily.  10/17/15  Yes Thomas S Kefalas, PA-C  PROAIR HFA 108 (90 BASE) MCG/ACT inhaler Inhale 1-2 puffs into the lungs every 6 (six) hours as needed for wheezing or shortness of breath.  08/31/15  Yes Historical Provider, MD  prochlorperazine (COMPAZINE) 10 MG tablet 10 mg by Feeding Tube route every 6 (six) hours as needed for nausea or vomiting.    Yes Historical Provider, MD  ranitidine (ZANTAC) 150 MG/10ML syrup Place 10 mLs (150 mg total) into feeding tube daily. 10/16/15  Yes Kerri Perches, MD  VIMPAT 10 MG/ML oral solution 150 mg by Feeding Tube route 2 (two) times daily.  12/01/15  Yes Historical Provider, MD  Water For Irrigation, Sterile (FREE WATER) SOLN Place 100 mLs into feeding tube every 4 (four) hours. 10/01/14  Yes Ripudeep Jenna Luo, MD  acetaminophen (TYLENOL) 650 MG CR tablet 650 mg by Feeding Tube route every 8 (eight) hours as needed for pain.     Historical Provider, MD   Allergies  Allergen Reactions  . Morphine Nausea And Vomiting   CBC:    Component Value Date/Time   WBC 11.5* 12/07/2015 0200   HGB 11.4* 12/07/2015 0200   HCT 32.4* 12/07/2015 0200   PLT 342 12/07/2015 0200   MCV 92.8 12/07/2015 0200   NEUTROABS 9.6* 12/05/2015 2241   LYMPHSABS 2.0 12/15/2015 2241   MONOABS 0.8 12/06/2015 2241   EOSABS 0.0 12/16/2015 2241   BASOSABS 0.0 12/19/2015 2241   Comprehensive Metabolic Panel:    Component Value Date/Time   NA 137 12/07/2015 0200   K 3.8 12/07/2015 0200   CL 102 12/07/2015 0200   CO2 26 12/07/2015 0200   BUN 13 12/07/2015 0200   CREATININE 0.68 12/07/2015 0200   CREATININE 0.68 04/03/2015 1122   GLUCOSE 146* 12/07/2015 0200   CALCIUM 9.5 12/07/2015 0200   AST 19 12/19/2015  2241   ALT 18 12/07/2015 2241   ALKPHOS 43 11/28/2015 2241   BILITOT 0.4 11/23/2015 2241   PROT 7.9 11/25/2015 2241   ALBUMIN 2.8* 11/30/2015 2241    Physical Exam: Vital Signs: BP 161/78 mmHg  Pulse 55  Temp(Src) 98.7 F (37.1 C) (Axillary)  Resp 24  Ht 5\' 6"  (1.676 m)  Wt 82.555 kg (182 lb)  BMI 29.39 kg/m2  SpO2 95% SpO2: SpO2: 95 % O2 Device: O2 Device: Nasal Cannula O2 Flow Rate: O2 Flow Rate (L/min): 2 L/min Intake/output summary:  Intake/Output Summary (Last 24 hours) at 12/07/15 1136 Last data filed at 12/06/15 1900  Gross per 24 hour  Intake 2001.17 ml  Output      0 ml  Net 2001.17 ml   LBM: Last BM Date: 12/06/15 Baseline Weight: Weight: 82.555 kg (182 lb) Most recent weight: Weight: 82.555 kg (182 lb)  Exam Findings:  Constitutional: Ill appearing. Frail elderly. Unable to determine orientation.    Cardiovascular: Normal rate, regular rhythm and normal heart sounds.  Pulmonary/Chest:  Marked work of breathing noted. Diminished left side. Coarse right.  Abdominal: Soft, rounded.  There is no tenderness/guarding.  Musculoskeletal: Normal range of motion. No edema.  Neurological:   Eyes open, but no meaningful response.   Skin: Skin is warm and very dry. No rash noted.  Nursing note and vitals reviewed.  Palliative Performance Scale: 10% at this time.               Additional Data Reviewed: Recent Labs     12/06/15  0523  12/07/15  0200  WBC  10.1  11.5*  HGB  10.2*  11.4*  PLT  268  342  NA  136  137  BUN  15  13  CREATININE  0.66  0.68     Time In: 0910 Time Out: 1010 Time Total: 60 minutes Greater than 50%  of this time was spent counseling and coordinating care related to the above assessment and plan.  Signed by: Katheran Awe, NP  Katheran Awe, NP  12/07/2015, 11:36 AM  Please contact Palliative Medicine Team phone at [phone] for questions and concerns.

## 2015-12-07 NOTE — Consult Note (Signed)
[facility] CM Inpatient Consult   12/07/2015  Melissa [name] 03-Sep-1946 [id number]  Spoke with patient daughter and [name], at bedside regarding [facility] Care Management. Patient verbalized  interest in [facility] Care Management services. Daughter signed consent form and packet with information given for disease management support or other services. Patient will receive post hospital follow up calls and be assessed for home visits.  Of note, [facility] Care Management services does not replace or interfere with any services that are arranged by inpatient case management or social work.  For questions, please contact: Alben Spittle. Albertha Ghee, RN, BSN, [facility]  [facility] Liaison [phone]

## 2015-12-07 NOTE — Progress Notes (Signed)
D- At approximately 0130 I was called to the room by the patient' daughter stating the patient was having trouble breathing.  Upon assessment patient was anxious, diaphoretic with labored fast respirations, audible wheezes and frothy oral secretions in obvious distress.  BP 207/104, respirations 30, HR 115 and respirations 30 with oxygen saturation of 92% on room air.    A-  Dr. Darrick Meigs called via phone and asked to assess the patient.  He placed new orders in [facility] that were carried out. (see his note further detail) After interventions patient calmed, respirations slowed and she is no longer in distress.  (see vital signs flowsheet for further details)  R-  Patient in bed sleeping with even and unlabored respirations.  Telemetry show NSR with PVC's.  Dr. Darrick Meigs called with updated on condition and current vitals.  He gave new orders for lab work.  Nursing staff to continue to monitor.

## 2015-12-07 NOTE — Progress Notes (Signed)
D- At approximately 2100 I was called to the room by NT and daughter stating that patient was "not acting right".   Upon arriving in the room patient is diaphoretic and has labored respirations.  Upon assessment patient shows bigeminy on telemetry monitor with HR in 120's, BP 192/125 respirations 24 and oxygen saturation is 100% on room air and rectal temperature is 101.4.  Breath sounds still have rhonchi throughout all fields  A- EKG done by respiratory therapy and she also gave a Xopenex breathing treatment.  Jeryl Columbia, NP notified of the above.  New orders were placed in [facility] and carried out.  Patient has scheduled tylenol at this time and it was given per orders.  Patient reassessed after medications and interventions had time to resolve issues.  Vitals were better with lower temperature, HR, BP and respirations see vital signs flowsheet for further explanation.  Jeryl Columbia, NP called and updated on improvement in patient condition.    R- Patient is now in bed with symptoms resolved and resting comfortably with daughter at bedside.  Nursing staff to continue to monitor.

## 2015-12-07 NOTE — Progress Notes (Signed)
SLP Cancellation Note  Patient Details Name: Melissa [name] MRN: [id number] DOB: [birth year]   Cancelled treatment:       Reason Eval/Treat Not Completed: Fatigue/lethargy limiting ability to participate; RN reports limited sleep last night and pt lethargy. ST to continue diet tolerance efforts when pt is ready    Melissa Chaco MA, [place] Speech Language Pathologist    Levi Aland 12/07/2015, 3:12 PM

## 2015-12-07 NOTE — Progress Notes (Signed)
[facility] PROGRESS NOTE  Brycelynn Empey Dever [id number]:[id number] DOB: 69/30/1947 DOA: 12/09/2015 PCP: [name], MD  Assessment/Plan: Aspiration pneumonia -Do not believe this represents healthcare associated pneumonia. -This is patient's third episode in 3 months. -Last night had episode of increased shortness of breath with temperature of 100.8 and today has an increased white count of 11.5 with significant increased work of breathing.  -We'll discontinue clindamycin and broaden antibiotics to Unasyn. -Chest x-ray shows complete opacification of the left lung which would be consistent with aspiration as she was just started on a diet yesterday. -We'll request a palliative care consultation given patient's poor baseline status and extremely poor prognosis. -Prolonged discussion with daughter Elmarie Shiley at bedside today.  History of CVA -Bedbound with right hemiplegia, dysphagia and a fascia. -Continue Plavix and statin. -Continue tube feeds.  Chronic kidney disease stage III -At baseline.  Hypothyroidism -TSH within normal limits, continue home dose of Synthroid.  Hyperlipidemia -Continue statin.  Hypertension -Fair control, continue home medications, do not anticipate any medication changes.  Code Status: DO NOT RESUSCITATE Family Communication: Daughter [name] at bedside updated on plan of care  Disposition Plan: To be determined   Consultants:  None   Antibiotics:  Unasyn  Subjective: Fixed stare, unable to verbalize or communicate, significant work of breathing  Objective: Filed Vitals:   12/07/15 0832 12/07/15 0941 12/07/15 1145 12/07/15 1454  BP:  161/78 161/93 161/70  Pulse: 71  92 85  Temp:    98.7 F (37.1 C)  TempSrc:    Axillary  Resp: 23   22  Height:      Weight:      SpO2: 95%  96% 99%    Intake/Output Summary (Last 24 hours) at 12/07/15 1559 Last data filed at 12/07/15 0800  Gross per 24 hour  Intake 1664.17 ml  Output       0 ml  Net 1664.17 ml   Filed Weights   12/05/15 0511  Weight: 82.555 kg (182 lb)    Exam:   General:  Awake, unable to assess orientation, significant work of breathing  Cardiovascular: Regular rate and rhythm, no murmurs, rubs or gallops  Respiratory: Intercostal retractions, abdominal breathing, decreased breath sounds to entire left lung fields  Abdomen: Soft, nontender, nondistended, positive bowel sounds  Extremities: 1+ pitting edema bilaterally   Neurologic:  Right hemiplegia, unable to fully assess given current mental state  Data Reviewed: Basic Metabolic Panel:  Recent Labs Lab 11/27/2015 2241 12/05/15 0550 12/06/15 0523 12/07/15 0200  NA 134* 136 136 137  K 4.0 4.0 3.8 3.8  CL 100* 105 106 102  CO2 28 28 27 26   GLUCOSE 107* 100* 89 146*  BUN 16 16 15 13   CREATININE 0.76 0.72 0.66 0.68  CALCIUM 9.2 9.0 8.9 9.5   Liver Function Tests:  Recent Labs Lab 12/06/2015 2241  AST 19  ALT 18  ALKPHOS 43  BILITOT 0.4  PROT 7.9  ALBUMIN 2.8*   No results for input(s): LIPASE, AMYLASE in the last 168 hours. No results for input(s): AMMONIA in the last 168 hours. CBC:  Recent Labs Lab 12/14/2015 2241 12/05/15 0550 12/06/15 0523 12/07/15 0200  WBC 12.5* 10.6* 10.1 11.5*  NEUTROABS 9.6*  --   --   --   HGB 11.2* 10.6* 10.2* 11.4*  HCT 31.9* 31.0* 29.7* 32.4*  MCV 93.5 94.2 93.1 92.8  PLT 296 285 268 342   Cardiac Enzymes: No results for input(s): CKTOTAL, CKMB, CKMBINDEX, TROPONINI in the  last 168 hours. BNP (last 3 results)  Recent Labs  11/29/15 2105 12/07/15 0200  BNP 816.0* 658.0*    ProBNP (last 3 results) No results for input(s): PROBNP in the last 8760 hours.  CBG:  Recent Labs Lab 12/07/15 0208  GLUCAP 122*    Recent Results (from the past 240 hour(s))  Urine culture     Status: None   Collection Time: 11/29/15  9:50 PM  Result Value Ref Range Status   Specimen Description URINE, CATHETERIZED  Final   Special Requests NONE   Final   Culture   Final    7,000 COLONIES/mL INSIGNIFICANT GROWTH Performed at [facility]    Report Status 12/01/2015 FINAL  Final  Blood culture (routine x 2)     Status: None (Preliminary result)   Collection Time: 12/05/15  1:07 AM  Result Value Ref Range Status   Specimen Description BLOOD RIGHT HAND  Final   Special Requests BOTTLES DRAWN AEROBIC AND ANAEROBIC 8CC EACH  Final   Culture NO GROWTH 2 DAYS  Final   Report Status PENDING  Incomplete  Blood culture (routine x 2)     Status: None (Preliminary result)   Collection Time: 12/05/15  1:20 AM  Result Value Ref Range Status   Specimen Description BLOOD RIGHT HAND  Final   Special Requests   Final    BOTTLES DRAWN AEROBIC AND ANAEROBIC AEB=8CC ANA=6CC   Culture NO GROWTH 2 DAYS  Final   Report Status PENDING  Incomplete     Studies: Dg Chest Port 1 View  12/07/2015  CLINICAL DATA:  Shortness breath. Pulmonary fibrosis. Chronic kidney disease. EXAM: PORTABLE CHEST 1 VIEW COMPARISON:  2015/12/05 FINDINGS: Patient is rotated to the left. There is complete opacification of the left pneumothorax, which may be due to pulmonary consolidation and/or effusion. Infiltrate is again noted at the right lung base which is mildly increased. IMPRESSION: Increased complete left lung opacification and right basilar infiltrate. Electronically Signed   By: Myles Rosenthal M.D.   On: 12/07/2015 08:30   Dg Swallowing Func-speech Pathology  12/06/2015  Objective Swallowing Evaluation:   Patient Details Name: Khisha Snellen Studley MRN: [id number] Date of Birth: [birth year] Today's Date: 12/06/2015 Time: SLP Start Time (ACUTE ONLY): 1556-SLP Stop Time (ACUTE ONLY): 1610 SLP Time Calculation (min) (ACUTE ONLY): 14 min Past Medical History: Past Medical History Diagnosis Date . Vertigo, intermittent  . Hypothyroidism  . Hyperlipidemia  . Obesity  . Hypertension    severe and resistant to treatment; 2008-negative left renal angiogram . Vitiligo  .  Degenerative disc disease, lumbar  . Degenerative disc disease, cervical  . Chest pain 2008   2008-normal coronary angiography . Stroke (HCC) 09/18/2014   right hemiparesis . Renal cell carcinoma    Right nephrectomy . Multiple myeloma (HCC) 08/26/2014 . History of pneumonia  . Emphysema of lung (HCC)  . CVA (cerebral infarction) 06/2015 Past Surgical History: Past Surgical History Procedure Laterality Date . Tubal ligation  1983   Bilateral . Nephrectomy     Right; secondary to renal cell carcinoma . Colonoscopy N/A 04/21/2014   Procedure: COLONOSCOPY;  Surgeon: Malissa Hippo, MD;  Location: [facility];  Service: Endoscopy;  Laterality: N/A; . Colonoscopy N/A 04/21/2014   Procedure: COLONOSCOPY;  Surgeon: Malissa Hippo, MD;  Location: [facility];  Service: Endoscopy;  Laterality: N/A;  1030 . Tee without cardioversion N/A 09/23/2014   Procedure: TRANSESOPHAGEAL ECHOCARDIOGRAM (TEE);  Surgeon: Thurmon Fair, MD;  Location[facility] ENDOSCOPY;  Service: Cardiovascular;  Laterality: N/A; . Peg tube placement  09/2014 HPI: Pt is a 69 y.o. 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 Hx of recurrent PNA  No Data Recorded Assessment / Plan / Recommendation CHL IP CLINICAL IMPRESSIONS 12/06/2015 Therapy Diagnosis Moderate oral phase dysphagia;Mild pharyngeal phase dysphagia Clinical Impression Pt presents with moderate oral phase and mild pharygneal dysphagia of previous neurogenic etiology given history (large CVA 09/15).  Noted weak propulsion of bolus, prolonged AP transit,  and lingual pumping with all PO resulting in premature spillage. Swallow  initiation triggered at the level of the valleculae across all consistencies except thin liquids via cup sip which was further delayed to the level of the pyriform sinuses. Despite swallow delay, pt exhibited adequate protection of the airway with only one instance of trace penetration during larger thin liquids via cup sip that cleared with pts reflexive swallow. No pharyngeal residuals present. Given recurrent PNA history and hx of fluctuation in mentation, recommend conservative diet of dysphagia 1 (puree) and nectar thick liquids with medicines crushed in puree. Recommend nutritional consult to determine amount of supplemental tube feedings indicated with intiation of PO diet. Pt with good prognosis for further diet upgrade. ST to follow up.  .   Impact on safety and function Mild aspiration risk   CHL IP TREATMENT RECOMMENDATION 12/06/2015 Treatment Recommendations Therapy as outlined in treatment plan below   Prognosis 12/06/2015 Prognosis for Safe Diet Advancement Good Barriers to Reach Goals -- Barriers/Prognosis Comment -- CHL IP DIET RECOMMENDATION 12/06/2015 SLP Diet Recommendations Dysphagia 1 (Puree) solids;Nectar thick liquid Liquid Administration via Cup Medication Administration Crushed with puree Compensations Slow rate;Small sips/bites Postural Changes Seated upright at 90 degrees;Remain semi-upright after after feeds/meals (Comment)   CHL IP OTHER RECOMMENDATIONS 12/06/2015 Recommended Consults -- Oral Care Recommendations Oral care BID Other Recommendations Order thickener from pharmacy   CHL IP FOLLOW UP RECOMMENDATIONS 12/06/2015 Follow up Recommendations 24 hour supervision/assistance;Skilled Nursing facility   [facility] IP FREQUENCY AND DURATION 12/06/2015 Speech Therapy Frequency (ACUTE ONLY) min 2x/week Treatment Duration 1 week      CHL IP ORAL PHASE 12/06/2015 Oral Phase Impaired Oral - Pudding Teaspoon -- Oral - Pudding Cup -- Oral - Honey Teaspoon -- Oral - Honey Cup -- Oral - Nectar Teaspoon  Weak lingual manipulation;Lingual pumping;Reduced posterior propulsion;Delayed oral transit;Premature spillage Oral - Nectar Cup Weak lingual manipulation;Lingual pumping;Delayed oral transit;Reduced posterior propulsion;Premature spillage Oral - Nectar Straw -- Oral - Thin Teaspoon Lingual pumping;Weak lingual manipulation;Delayed oral transit;Premature spillage;Reduced posterior propulsion Oral - Thin Cup Weak lingual manipulation;Lingual pumping;Delayed oral transit;Premature spillage;Reduced posterior propulsion Oral - Thin Straw -- Oral - Puree Weak lingual manipulation;Lingual pumping;Delayed oral transit;Reduced posterior propulsion Oral - Mech Soft -- Oral - Regular -- Oral - Multi-Consistency -- Oral - Pill -- Oral Phase - Comment --  CHL IP PHARYNGEAL PHASE 12/06/2015 Pharyngeal Phase Impaired Pharyngeal- Pudding Teaspoon -- Pharyngeal -- Pharyngeal- Pudding Cup -- Pharyngeal -- Pharyngeal- Honey Teaspoon -- Pharyngeal -- Pharyngeal- Honey Cup -- Pharyngeal -- Pharyngeal- Nectar Teaspoon Delayed swallow initiation-vallecula;Lateral channel residue Pharyngeal -- Pharyngeal- Nectar Cup Delayed swallow initiation-vallecula;Lateral channel residue Pharyngeal -- Pharyngeal- Nectar Straw -- Pharyngeal --  Pharyngeal- Thin Teaspoon Delayed swallow initiation-vallecula Pharyngeal -- Pharyngeal- Thin Cup Delayed swallow initiation-vallecula;Penetration/Aspiration during swallow Pharyngeal Material enters airway, remains ABOVE vocal cords then ejected out Pharyngeal- Thin Straw -- Pharyngeal -- Pharyngeal- Puree Delayed swallow initiation-vallecula Pharyngeal -- Pharyngeal- Mechanical Soft -- Pharyngeal -- Pharyngeal- Regular -- Pharyngeal -- Pharyngeal- Multi-consistency -- Pharyngeal -- Pharyngeal- Pill -- Pharyngeal -- Pharyngeal Comment --  No flowsheet data found. Marcene Duos MA, CCC-SLP Acute Care Speech Language Pathologist  Kennieth Rad 12/06/2015, 4:37 PM               Scheduled Meds: .  acetaminophen  650 mg Per Tube 3 times per day  . amLODipine  10 mg Per Tube Daily  . ampicillin-sulbactam (UNASYN) IV  3 g Intravenous Q8H  . carvedilol  6.25 mg Per Tube BID WC  . cloNIDine  0.3 mg Transdermal Weekly  . clopidogrel  75 mg Per Tube Daily  . DULoxetine  60 mg Oral Daily  . feeding supplement (JEVITY 1.5 CAL/FIBER)  237 mL Per Tube 5 X Daily  . free water  100 mL Per Tube 6 times per day  . heparin  5,000 Units Subcutaneous 3 times per day  . hydrALAZINE  25 mg Per Tube TID  . lacosamide  150 mg Per Tube BID  . levalbuterol  0.63 mg Nebulization BID  . levETIRAcetam  1,000 mg Per Tube BID  . [START ON 12/09/2015] levothyroxine  37.5 mcg Oral Once per day on Sun Sat  . levothyroxine  75 mcg Per Tube Once per day on Mon Tue Wed Thu Fri  . pantoprazole sodium  40 mg Per Tube Daily  . pravastatin  40 mg Per Tube q1800  . ranitidine  150 mg Per Tube Daily   Continuous Infusions: . sodium chloride 10 mL/hr at 12/07/15 0131    Principal Problem:   HCAP (healthcare-associated pneumonia) Active Problems:   Hypothyroid   Obesity   Pulmonary fibrosis (HCC)   Bence Jones proteinuria   CKD (chronic kidney disease) stage 3, GFR 30-59 ml/min   Hemiplegia affecting right dominant side (HCC)   Multiple myeloma (HCC)   HLD (hyperlipidemia)   Seizure disorder (HCC)   PEG (percutaneous endoscopic gastrostomy) status (HCC)   Pressure ulcer    Time spent: 25 minutes. Greater than 50% of this time was spent in direct contact with the patient coordinating care.    Chaya Jan  [facility] Pager [phone]  If 7PM-7AM, please contact night-coverage at [email or web address], password [facility] 12/07/2015, 3:59 PM  LOS: 2 days

## 2015-12-07 NOTE — Progress Notes (Signed)
Initial Nutrition Assessment  INTERVENTION:  Bolus feedings Jevity 1.5 via PEG 237 ml 5 times daily and 600 ml water daily. Provides 1775 kcal, 79 gr protein and 1500 ml water including flushes.  NUTRITION DIAGNOSIS:   Inadequate oral intake related to dysphagia as evidenced by per patient/family report.   GOAL: Proceed with nutritional care per pt/family decisions    MONITOR: TF tolerance,Diet advancement per ST and plan of care following palliative assessment  REASON FOR ASSESSMENT:   Malnutrition Screening Tool    ASSESSMENT: Pt has hx of large left MCA infarct, dysphagia and multiple myeloma. Also aspiration pneumonia multiple time per daughter [name]. Pt is cared for by her 2 daughters. She is bedbound and had a PEG tube placed last October (2015) after meeting with medical team and palliative team MD (Dr.Lampkin). Pt has been maintaining her current weight with bolus feedings via PEG. Jevity 1.5 at 5 cans per day and 600 ml daily of free water.  Pt has also been assessed by ST and diet advanced but pt is unable to work with speech therapy today due to somnolence.  Limited NFPE completed.   Diet Order:  DIET - DYS 1 Room service appropriate?: Yes; Fluid consistency:: Nectar Thick  Skin:   stage II to left buttock  Last BM:   12/14   Height:   Ht Readings from Last 1 Encounters:  12/05/15 5' 6" (1.676 m)    Weight:   Wt Readings from Last 1 Encounters:  12/05/15 182 lb (82.555 kg)    Ideal Body Weight:  59 kg  BMI:  Body mass index is 29.39 kg/(m^2).  Estimated Nutritional Needs:   Kcal:  1475-1770  Protein:  80-90 gr  Fluid:  1.5-1.8 liters daily  EDUCATION NEEDS:   No education needs identified at this time  Lynn  MS,RD,CSG,LDN Office: #[phone] Pager: #[phone]  

## 2015-12-08 DIAGNOSIS — N183 Chronic kidney disease, stage 3 (moderate): Secondary | ICD-10-CM

## 2015-12-08 NOTE — Care Management Note (Signed)
Case Management Note  Patient Details  Name: Melissa [name] MRN: [id number] Date of Birth: May 12, 1946  Subjective/Objective:                    Action/Plan:   Expected Discharge Date:                  Expected Discharge Plan:  Home/Self Care  In-House Referral:  NA  Discharge planning Services  CM Consult  Post Acute Care Choice:  NA Choice offered to:  NA  DME Arranged:    DME Agency:     HH Arranged:    HH Agency:     Status of Service:  In process, will continue to follow  Medicare Important Message Given:  Yes Date Medicare IM Given:    Medicare IM give by:    Date Additional Medicare IM Given:    Additional Medicare Important Message give by:     If discussed at [place] of Stay Meetings, dates discussed:    Additional Comments: Palliative care to meet with pts family today again. Will continue to follow for discharge planning needs. Portable suction and neb machine arranged with [facility] by Maryella Shivers, RN CM. [name], RN 12/08/2015, 10:44 AM

## 2015-12-08 NOTE — Progress Notes (Signed)
1600 CPT deferred by family, they were waiting for pain medication for patient.

## 2015-12-08 NOTE — Progress Notes (Signed)
[facility] PROGRESS NOTE  Melissa [name]:[id number] DOB: 07/15/1946 DOA: 12/02/2015 PCP: [name], MD  Assessment/Plan: Aspiration pneumonia -Do not believe this represents healthcare associated pneumonia. -This is patient's third episode in 3 months. -Continue Unasyn for aspiration coverage. -Work of breathing has improved with morphine. Daughter still wants to wait another 48 hours on antibiotics to make a decision, however I suspect they will elect to proceed with hospice care at home. -Chest x-ray shows complete opacification of the left lung as well as a right basilar infiltrate. -Appreciated palliative care input and recommendation.  History of CVA -Bedbound with right hemiplegia, dysphagia and aphasia. -Continue Plavix and statin. -Continue tube feeds.  Chronic kidney disease stage III -At baseline.  Hypothyroidism -TSH within normal limits, continue home dose of Synthroid.  Hyperlipidemia -Continue statin.  Hypertension -Fair control, continue home medications, do not anticipate any medication changes.  Code Status: DO NOT RESUSCITATE Family Communication: Daughter [name] at bedside updated on plan of care  Disposition Plan: To be determined; likely home with hospice over the weekend or early next week.   Consultants:  None   Antibiotics:  Unasyn  Subjective: Lying in bed, unresponsive, appears comfortable.  Objective: Filed Vitals:   12/07/15 2106 12/08/15 0454 12/08/15 0805 12/08/15 1100  BP: 155/74 169/66  154/74  Pulse: 85 79  81  Temp: 97.7 F (36.5 C) 98.3 F (36.8 C)    TempSrc: Oral Oral    Resp: 20 20  22   Height:      Weight:      SpO2: 98% 100% 98%     Intake/Output Summary (Last 24 hours) at 12/08/15 1329 Last data filed at 12/08/15 1119  Gross per 24 hour  Intake 590.67 ml  Output      0 ml  Net 590.67 ml   Filed Weights   12/05/15 0511  Weight: 82.555 kg (182 lb)    Exam:   General:   Drowsy, unable to arouse  Cardiovascular: Regular rate and rhythm, no murmurs, rubs or gallops  Respiratory: Intercostal retractions, abdominal breathing, decreased breath sounds to entire left lung fields  Abdomen: Soft, nontender, nondistended, positive bowel sounds  Extremities: 1+ pitting edema bilaterally   Neurologic:  Right hemiplegia, unable to fully assess given current mental state  Data Reviewed: Basic Metabolic Panel:  Recent Labs Lab 12/15/2015 2241 12/05/15 0550 12/06/15 0523 12/07/15 0200  NA 134* 136 136 137  K 4.0 4.0 3.8 3.8  CL 100* 105 106 102  CO2 28 28 27 26   GLUCOSE 107* 100* 89 146*  BUN 16 16 15 13   CREATININE 0.76 0.72 0.66 0.68  CALCIUM 9.2 9.0 8.9 9.5   Liver Function Tests:  Recent Labs Lab 11/23/2015 2241  AST 19  ALT 18  ALKPHOS 43  BILITOT 0.4  PROT 7.9  ALBUMIN 2.8*   No results for input(s): LIPASE, AMYLASE in the last 168 hours. No results for input(s): AMMONIA in the last 168 hours. CBC:  Recent Labs Lab 12/20/2015 2241 12/05/15 0550 12/06/15 0523 12/07/15 0200  WBC 12.5* 10.6* 10.1 11.5*  NEUTROABS 9.6*  --   --   --   HGB 11.2* 10.6* 10.2* 11.4*  HCT 31.9* 31.0* 29.7* 32.4*  MCV 93.5 94.2 93.1 92.8  PLT 296 285 268 342   Cardiac Enzymes: No results for input(s): CKTOTAL, CKMB, CKMBINDEX, TROPONINI in the last 168 hours. BNP (last 3 results)  Recent Labs  11/29/15 2105 12/07/15 0200  BNP 816.0* 658.0*  ProBNP (last 3 results) No results for input(s): PROBNP in the last 8760 hours.  CBG:  Recent Labs Lab 12/07/15 0208  GLUCAP 122*    Recent Results (from the past 240 hour(s))  Urine culture     Status: None   Collection Time: 11/29/15  9:50 PM  Result Value Ref Range Status   Specimen Description URINE, CATHETERIZED  Final   Special Requests NONE  Final   Culture   Final    7,000 COLONIES/mL INSIGNIFICANT GROWTH Performed at [facility]    Report Status 12/01/2015 FINAL  Final  Blood  culture (routine x 2)     Status: None (Preliminary result)   Collection Time: 12/05/15  1:07 AM  Result Value Ref Range Status   Specimen Description BLOOD RIGHT HAND  Final   Special Requests BOTTLES DRAWN AEROBIC AND ANAEROBIC 8CC EACH  Final   Culture NO GROWTH 2 DAYS  Final   Report Status PENDING  Incomplete  Blood culture (routine x 2)     Status: None (Preliminary result)   Collection Time: 12/05/15  1:20 AM  Result Value Ref Range Status   Specimen Description BLOOD RIGHT HAND  Final   Special Requests   Final    BOTTLES DRAWN AEROBIC AND ANAEROBIC AEB=8CC ANA=6CC   Culture NO GROWTH 2 DAYS  Final   Report Status PENDING  Incomplete     Studies: Dg Chest Port 1 View  12/07/2015  CLINICAL DATA:  Shortness breath. Pulmonary fibrosis. Chronic kidney disease. EXAM: PORTABLE CHEST 1 VIEW COMPARISON:  15-Dec-2015 FINDINGS: Patient is rotated to the left. There is complete opacification of the left pneumothorax, which may be due to pulmonary consolidation and/or effusion. Infiltrate is again noted at the right lung base which is mildly increased. IMPRESSION: Increased complete left lung opacification and right basilar infiltrate. Electronically Signed   By: Myles Rosenthal M.D.   On: 12/07/2015 08:30   Dg Swallowing Func-speech Pathology  12/06/2015  Objective Swallowing Evaluation:   Patient Details Name: Melissa [name] MRN: [id number] Date of Birth: [birth year] Today's Date: 12/06/2015 Time: SLP Start Time (ACUTE ONLY): 1556-SLP Stop Time (ACUTE ONLY): 1610 SLP Time Calculation (min) (ACUTE ONLY): 14 min Past Medical History: Past Medical History Diagnosis Date . Vertigo, intermittent  . Hypothyroidism  . Hyperlipidemia  . Obesity  . Hypertension    severe and resistant to treatment; 2008-negative left renal angiogram . Vitiligo  . Degenerative disc disease, lumbar  . Degenerative disc disease, cervical  . Chest pain 2008   2008-normal coronary angiography . Stroke (HCC) 09/18/2014   right  hemiparesis . Renal cell carcinoma    Right nephrectomy . Multiple myeloma (HCC) 08/26/2014 . History of pneumonia  . Emphysema of lung (HCC)  . CVA (cerebral infarction) 06/2015 Past Surgical History: Past Surgical History Procedure Laterality Date . Tubal ligation  1983   Bilateral . Nephrectomy     Right; secondary to renal cell carcinoma . Colonoscopy N/A 04/21/2014   Procedure: COLONOSCOPY;  Surgeon: Malissa Hippo, MD;  Location: [facility];  Service: Endoscopy;  Laterality: N/A; . Colonoscopy N/A 04/21/2014   Procedure: COLONOSCOPY;  Surgeon: Malissa Hippo, MD;  Location: [facility];  Service: Endoscopy;  Laterality: N/A;  1030 . Tee without cardioversion N/A 09/23/2014   Procedure: TRANSESOPHAGEAL ECHOCARDIOGRAM (TEE);  Surgeon: Thurmon Fair, MD;  Location[facility] ENDOSCOPY;  Service: Cardiovascular;  Laterality: N/A; . Peg tube placement  09/2014 HPI: Pt is a 69 y.o. female with hx of  prior CVA, resulting in aphasia, bedbound, right hemiplegia and left hemiparesis, dysphagia requiring PEG and TF ( Jevity 5 cans per day), and strict NPO, hypothyroidism, HLD, HTN, she was treated on by mouth antibiotic as an outpatient for presumed pneumonia , presents with fever, cough despite taking by mouth antibiotic her CXR significant for LLL infiltrate, admitted for its CAP pneumonia treatment. Hx of recurrent PNA  No Data Recorded Assessment / Plan / Recommendation CHL IP CLINICAL IMPRESSIONS 12/06/2015 Therapy Diagnosis Moderate oral phase dysphagia;Mild pharyngeal phase dysphagia Clinical Impression Pt presents with moderate oral phase and mild pharygneal dysphagia of previous neurogenic etiology given history (large CVA 09/15).  Noted weak propulsion of bolus, prolonged AP transit,  and lingual pumping with all PO resulting in premature spillage. Swallow initiation triggered at the level of the valleculae across all consistencies except thin liquids via cup sip which was further delayed to the level of the  pyriform sinuses. Despite swallow delay, pt exhibited adequate protection of the airway with only one instance of trace penetration during larger thin liquids via cup sip that cleared with pts reflexive swallow. No pharyngeal residuals present. Given recurrent PNA history and hx of fluctuation in mentation, recommend conservative diet of dysphagia 1 (puree) and nectar thick liquids with medicines crushed in puree. Recommend nutritional consult to determine amount of supplemental tube feedings indicated with intiation of PO diet. Pt with good prognosis for further diet upgrade. ST to follow up.  .   Impact on safety and function Mild aspiration risk   CHL IP TREATMENT RECOMMENDATION 12/06/2015 Treatment Recommendations Therapy as outlined in treatment plan below   Prognosis 12/06/2015 Prognosis for Safe Diet Advancement Good Barriers to Reach Goals -- Barriers/Prognosis Comment -- CHL IP DIET RECOMMENDATION 12/06/2015 SLP Diet Recommendations Dysphagia 1 (Puree) solids;Nectar thick liquid Liquid Administration via Cup Medication Administration Crushed with puree Compensations Slow rate;Small sips/bites Postural Changes Seated upright at 90 degrees;Remain semi-upright after after feeds/meals (Comment)   CHL IP OTHER RECOMMENDATIONS 12/06/2015 Recommended Consults -- Oral Care Recommendations Oral care BID Other Recommendations Order thickener from pharmacy   CHL IP FOLLOW UP RECOMMENDATIONS 12/06/2015 Follow up Recommendations 24 hour supervision/assistance;Skilled Nursing facility   [facility] IP FREQUENCY AND DURATION 12/06/2015 Speech Therapy Frequency (ACUTE ONLY) min 2x/week Treatment Duration 1 week      CHL IP ORAL PHASE 12/06/2015 Oral Phase Impaired Oral - Pudding Teaspoon -- Oral - Pudding Cup -- Oral - Honey Teaspoon -- Oral - Honey Cup -- Oral - Nectar Teaspoon Weak lingual manipulation;Lingual pumping;Reduced posterior propulsion;Delayed oral transit;Premature spillage Oral - Nectar Cup Weak lingual  manipulation;Lingual pumping;Delayed oral transit;Reduced posterior propulsion;Premature spillage Oral - Nectar Straw -- Oral - Thin Teaspoon Lingual pumping;Weak lingual manipulation;Delayed oral transit;Premature spillage;Reduced posterior propulsion Oral - Thin Cup Weak lingual manipulation;Lingual pumping;Delayed oral transit;Premature spillage;Reduced posterior propulsion Oral - Thin Straw -- Oral - Puree Weak lingual manipulation;Lingual pumping;Delayed oral transit;Reduced posterior propulsion Oral - Mech Soft -- Oral - Regular -- Oral - Multi-Consistency -- Oral - Pill -- Oral Phase - Comment --  CHL IP PHARYNGEAL PHASE 12/06/2015 Pharyngeal Phase Impaired Pharyngeal- Pudding Teaspoon -- Pharyngeal -- Pharyngeal- Pudding Cup -- Pharyngeal -- Pharyngeal- Honey Teaspoon -- Pharyngeal -- Pharyngeal- Honey Cup -- Pharyngeal -- Pharyngeal- Nectar Teaspoon Delayed swallow initiation-vallecula;Lateral channel residue Pharyngeal -- Pharyngeal- Nectar Cup Delayed swallow initiation-vallecula;Lateral channel residue Pharyngeal -- Pharyngeal- Nectar Straw -- Pharyngeal -- Pharyngeal- Thin Teaspoon Delayed swallow initiation-vallecula Pharyngeal -- Pharyngeal- Thin Cup Delayed swallow initiation-vallecula;Penetration/Aspiration during swallow Pharyngeal Material enters airway, remains  ABOVE vocal cords then ejected out Pharyngeal- Thin Straw -- Pharyngeal -- Pharyngeal- Puree Delayed swallow initiation-vallecula Pharyngeal -- Pharyngeal- Mechanical Soft -- Pharyngeal -- Pharyngeal- Regular -- Pharyngeal -- Pharyngeal- Multi-consistency -- Pharyngeal -- Pharyngeal- Pill -- Pharyngeal -- Pharyngeal Comment --  No flowsheet data found. Marcene Duos MA, CCC-SLP Acute Care Speech Language Pathologist  Kennieth Rad 12/06/2015, 4:37 PM               Scheduled Meds: . acetaminophen  650 mg Per Tube 3 times per day  . amLODipine  10 mg Per Tube Daily  . ampicillin-sulbactam (UNASYN) IV  3 g Intravenous Q8H  .  carvedilol  6.25 mg Per Tube BID WC  . cloNIDine  0.3 mg Transdermal Weekly  . clopidogrel  75 mg Per Tube Daily  . DULoxetine  60 mg Oral Daily  . feeding supplement (JEVITY 1.5 CAL/FIBER)  237 mL Per Tube 5 X Daily  . free water  100 mL Per Tube 6 times per day  . heparin  5,000 Units Subcutaneous 3 times per day  . hydrALAZINE  25 mg Per Tube TID  . lacosamide  150 mg Per Tube BID  . levalbuterol  0.63 mg Nebulization BID  . levETIRAcetam  1,000 mg Per Tube BID  . [START ON 12/09/2015] levothyroxine  37.5 mcg Oral Once per day on Sun Sat  . levothyroxine  75 mcg Per Tube Once per day on Mon Tue Wed Thu Fri  . pantoprazole sodium  40 mg Per Tube Daily  . pravastatin  40 mg Per Tube q1800  . ranitidine  150 mg Per Tube Daily   Continuous Infusions: . sodium chloride 10 mL/hr at 12/07/15 0131    Principal Problem:   HCAP (healthcare-associated pneumonia) Active Problems:   Hypothyroid   Obesity   Pulmonary fibrosis (HCC)   Bence Jones proteinuria   CKD (chronic kidney disease) stage 3, GFR 30-59 ml/min   Hemiplegia affecting right dominant side (HCC)   Multiple myeloma (HCC)   HLD (hyperlipidemia)   Seizure disorder (HCC)   PEG (percutaneous endoscopic gastrostomy) status (HCC)   Pressure ulcer   Aspiration pneumonia due to vomit (HCC)   DNR (do not resuscitate) discussion   Palliative care encounter    Time spent: 25 minutes. Greater than 50% of this time was spent in direct contact with the patient coordinating care.    Chaya Jan  [facility] Pager [phone]  If 7PM-7AM, please contact night-coverage at [email or web address], password [facility] 12/08/2015, 1:29 PM  LOS: 3 days

## 2015-12-08 NOTE — Progress Notes (Signed)
Speech Language Pathology Treatment: Dysphagia  Patient Details Name: Melissa [name] MRN: [id number] DOB: Sep 26, 1946 Today's Date: 12/08/2015 Time: [id number] SLP Time Calculation (min) (ACUTE ONLY): 12 min  Assessment / Plan / Recommendation Clinical Impression  ST followed up since PO diet implementation. Daughter reports only PO successfully given was on 12-06-15 (dinner meal following MBS with dysphagia 1 (puree) and nectar thick diet). Since then, pt has had worsening left lobe opacity and new right base infiltrate.  Nursing reports intermittent family noncompliance with postural recommendations for PEG (30 degrees elevation at all times) and has found pt lying completely flat in bed with oral tube feed residuals present. Suspect aspiration of tube feedings given severity of PNA progression and minimal PO intake.   Edcuation reinforced for reducing aspiration risk to family this date including oral care QID and postural recommendations. Recommend NPO with medicines via alternative means, daughter in agreement. ST to sign off.        HPI HPI: Pt is a 69 y.o. female with hx of prior CVA, resulting in aphasia, bedbound, right hemiplegia and left hemiparesis, dysphagia requiring PEG and TF ( Jevity 5 cans per day),  hypothyroidism, HLD, HTN, she was treated on by mouth antibiotic as an outpatient for presumed pneumonia , presents with fever, cough despite taking by mouth antibiotic her CXR significant for LLL infiltrate. Since hospitalization and initiation of PO diet per MBS results, pt with worsening opacities, now Increased complete left lung opacification and right basilar infiltrate per chest x ray on 12-07-15.       SLP Plan  Discharge SLP treatment due to (comment) (decline in status and end of life care wishes )     Recommendations  Diet recommendations: NPO Medication Administration: Via alternative means Postural Changes and/or Swallow Maneuvers:  (upright at least 30 degrees at all  times secondary to PEG)              Oral Care Recommendations: Oral care QID Follow up Recommendations: 24 hour supervision/assistance Plan: Discharge SLP treatment due to (comment) (decline in status and end of life care wishes )  Melissa Chaco MA, [place] Pathologist    Melissa [name] 12/08/2015, 2:24 PM

## 2015-12-08 NOTE — Care Management Important Message (Signed)
Important Message  Patient Details  Name: Melissa [name] MRN: [id number] Date of Birth: 10/03/1946   Medicare Important Message Given:  Yes    Joylene Draft, RN 12/08/2015, 10:44 AM

## 2015-12-08 NOTE — Progress Notes (Signed)
Daily Progress Note   Patient Name: Melissa [name]       Date: 12/08/2015 DOB: [birth year]  Age: 69 y.o. MRN#: [id number] Attending Physician: Memory Argue* Primary Care Physician: [name], MD Admit Date: 12/14/2015  Reason for Consultation/Follow-up: Establishing goals of care and Psychosocial/spiritual support  Subjective: Melissa [name] appears much more comfortable today. She is resting when I enter and does not open her eyes when i call her name.  Her daughter Melissa [name] is in the hallway and we talk about Melissa [name] comfort level and breathing.  Melissa [name] talks about her sister Melissa [name] and that she needed to have medical information first, and now has a better understanding of their mother's illness.  We talk about the next few days showing if Melissa [name] will be able to recover and the kindness of making her comfortable.  Melissa [name] states her sister will be at the hospital around 5 pm.      Length of Stay: 3 days  Current Medications: Scheduled Meds:  . acetaminophen  650 mg Per Tube 3 times per day  . amLODipine  10 mg Per Tube Daily  . ampicillin-sulbactam (UNASYN) IV  3 g Intravenous Q8H  . carvedilol  6.25 mg Per Tube BID WC  . cloNIDine  0.3 mg Transdermal Weekly  . clopidogrel  75 mg Per Tube Daily  . DULoxetine  60 mg Oral Daily  . feeding supplement (JEVITY 1.5 CAL/FIBER)  237 mL Per Tube 5 X Daily  . free water  100 mL Per Tube 6 times per day  . heparin  5,000 Units Subcutaneous 3 times per day  . hydrALAZINE  25 mg Per Tube TID  . lacosamide  150 mg Per Tube BID  . levalbuterol  0.63 mg Nebulization BID  . levETIRAcetam  1,000 mg Per Tube BID  . [START ON 12/09/2015] levothyroxine  37.5 mcg Oral Once per day on Sun Sat  . levothyroxine  75 mcg Per Tube Once per day on Mon Tue Wed Thu Fri  . pantoprazole sodium  40 mg Per Tube Daily  . pravastatin  40 mg Per Tube q1800  . ranitidine  150 mg Per Tube Daily    Continuous Infusions: . sodium  chloride 10 mL/hr at 12/07/15 0131    PRN Meds: albuterol, LORazepam, morphine CONCENTRATE, polyethylene glycol, prochlorperazine  Palliative Performance Scale: 20%     Vital Signs: BP 172/82 mmHg  Pulse 80  Temp(Src) 98.6 F (37 C) (Axillary)  Resp 21  Ht 5\' 6"  (1.676 m)  Wt 82.555 kg (182 lb)  BMI 29.39 kg/m2  SpO2 96% SpO2: SpO2: 96 % O2 Device: O2 Device: Nasal Cannula O2 Flow Rate: O2 Flow Rate (L/min): 3 L/min  Intake/output summary:  Intake/Output Summary (Last 24 hours) at 12/08/15 1553 Last data filed at 12/08/15 1119  Gross per 24 hour  Intake 590.67 ml  Output      0 ml  Net 590.67 ml   LBM:   Baseline Weight: Weight: 82.555 kg (182 lb) Most recent weight: Weight: 82.555 kg (182 lb)             Additional Data Reviewed: Recent Labs     12/06/15  0523  12/07/15  0200  WBC  10.1  11.5*  HGB  10.2*  11.4*  PLT  268  342  NA  136  137  BUN  15  13  CREATININE  0.66  0.68     Problem List:  Patient Active  Problem List   Diagnosis Date Noted  . Aspiration pneumonia due to vomit (HCC)   . DNR (do not resuscitate) discussion   . Palliative care encounter   . HCAP (healthcare-associated pneumonia) 12/05/2015  . Pressure ulcer 12/05/2015  . Hospital discharge follow-up 10/21/2015  . Hypernatremia 10/05/2015  . Dehydration 09/06/2015  . Elevated LFTs 09/05/2015  . Seizure disorder (HCC) 09/05/2015  . PEG (percutaneous endoscopic gastrostomy) status (HCC) 09/05/2015  . Aspiration pneumonia (HCC) 09/04/2015  . Altered mental status 09/04/2015  . Bacteremia due to Staphylococcus   . Chronic kidney disease   . Right foot drop   . Stroke (HCC)   . CKD (chronic kidney disease)   . Essential hypertension   . HLD (hyperlipidemia)   . Other specified hypothyroidism   . Dysphagia   . Partial seizure (HCC)   . Right elbow pain   . Multiple myeloma (HCC) 07/25/2015  . Acute ischemic stroke (HCC) 07/25/2015  . Seizure (HCC) 07/24/2015  . Left hip  pain 07/24/2015  . Hemiplegia affecting right dominant side (HCC) 07/24/2015  . Thyroid nodule 04/21/2015  . Chronic ischemic left MCA stroke 04/21/2015  . Pain in right elbow 04/03/2015  . Dysphagia due to recent stroke 11/10/2014  . CKD (chronic kidney disease) stage 3, GFR 30-59 ml/min 09/19/2014  . Thrombocytopenia, unspecified (HCC) 09/18/2014  . 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 Difficulty walking 04/18/2014  . Bilateral leg weakness 04/18/2014  . Positive ANA (antinuclear antibody) 03/22/2014  . Lung nodules 08/02/2013  . Pulmonary fibrosis (HCC) 03/29/2013  . Anemia 08/13/2012  . Hyperlipidemia   . History of kidney cancer   . Vitiligo 04/01/2011  . HEARING LOSS 07/18/2010  . Depression 05/18/2010  . Vitamin D deficiency 10/24/2009  . Hypothyroid 03/31/2008  . Obesity 03/31/2008  . VERTIGO, INTERMITTENT 03/31/2008     Palliative Care Assessment & Plan    Code Status:  DNR  Goals of Care:  Daughters would like trial of antibiotic through the weekend, then home with Hospice if no improvement.   Symptom Management:  Morphine 2.6 mg PT Q 4 hours PRN.   Palliative Prophylaxis:  Miralax 17 G PT QD PRN  Psycho-social/Spiritual:  Desire for further Chaplaincy support:no   Prognosis: Unable to determine. Poor prognosis, likely limited, less that 1 month.  Discharge Planning: trail of antibiotics through the weekend, if no improvement then home with Hospice.    Care plan was discussed with nursing staff, CM, SW, and Dr. Ardyth Harps.   Thank you for allowing the Palliative Medicine Team to assist in the care of this patient.   Time In: 1220 Time Out: 1235 Total Time 15 minutes Prolonged Time Billed  no     Greater than 50%  of this time was spent counseling and coordinating care related to the above assessment and plan.   Katheran Awe, NP  12/08/2015, 3:53 PM  Please contact Palliative Medicine Team  phone at [phone] for questions and concerns.

## 2015-12-09 NOTE — Progress Notes (Signed)
[facility] PROGRESS NOTE  Melissa [name] BMW:[id number] DOB: 1946-04-17 DOA: 12/23/2015 PCP: [name], MD  Assessment/Plan: Aspiration pneumonia -Do not believe this represents healthcare associated pneumonia. -This is patient's third episode in 3 months. -Continue Unasyn for aspiration coverage. -Work of breathing has improved with morphine. Daughter still wants to wait another 48 hours on antibiotics to make a decision, however I suspect they will elect to proceed with hospice care at home. -Chest x-ray shows complete opacification of the left lung as well as a right basilar infiltrate. -Appreciate palliative care input and recommendation.  History of CVA -Bedbound with right hemiplegia, dysphagia and aphasia. -Continue Plavix and statin. -Continue tube feeds.  Chronic kidney disease stage III -At baseline.  Hypothyroidism -TSH within normal limits, continue home dose of Synthroid.  Hyperlipidemia -Continue statin.  Hypertension -Fair control, continue home medications, do not anticipate any medication changes.  Code Status: DO NOT RESUSCITATE Family Communication: Daughter [name] at bedside updated on plan of care  Disposition Plan: To be determined; likely home with hospice over the weekend or early next week.   Consultants:  None   Antibiotics:  Unasyn  Subjective: Lying in bed, unresponsive, appears comfortable.  Objective: Filed Vitals:   12/09/15 0612 12/09/15 0816 12/09/15 1450 12/09/15 1500  BP: 174/91   172/84  Pulse: 50   84  Temp: 98.9 F (37.2 C)   98.7 F (37.1 C)  TempSrc: Oral   Oral  Resp: 20   18  Height:      Weight:      SpO2: 98% 100% 99% 99%    Intake/Output Summary (Last 24 hours) at 12/09/15 1746 Last data filed at 12/09/15 1609  Gross per 24 hour  Intake 2565.67 ml  Output    875 ml  Net 1690.67 ml   Filed Weights   12/05/15 0511  Weight: 82.555 kg (182 lb)    Exam:   General:  Drowsy,  unable to arouse  Cardiovascular: Regular rate and rhythm, no murmurs, rubs or gallops  Respiratory: Intercostal retractions, abdominal breathing, decreased breath sounds to entire left lung fields  Abdomen: Soft, nontender, nondistended, positive bowel sounds  Extremities: 1+ pitting edema bilaterally   Neurologic:  Right hemiplegia, unable to fully assess given current mental state  Data Reviewed: Basic Metabolic Panel:  Recent Labs Lab 12/18/2015 2241 12/05/15 0550 12/06/15 0523 12/07/15 0200  NA 134* 136 136 137  K 4.0 4.0 3.8 3.8  CL 100* 105 106 102  CO2 28 28 27 26   GLUCOSE 107* 100* 89 146*  BUN 16 16 15 13   CREATININE 0.76 0.72 0.66 0.68  CALCIUM 9.2 9.0 8.9 9.5   Liver Function Tests:  Recent Labs Lab 11/29/2015 2241  AST 19  ALT 18  ALKPHOS 43  BILITOT 0.4  PROT 7.9  ALBUMIN 2.8*   No results for input(s): LIPASE, AMYLASE in the last 168 hours. No results for input(s): AMMONIA in the last 168 hours. CBC:  Recent Labs Lab 12/17/2015 2241 12/05/15 0550 12/06/15 0523 12/07/15 0200  WBC 12.5* 10.6* 10.1 11.5*  NEUTROABS 9.6*  --   --   --   HGB 11.2* 10.6* 10.2* 11.4*  HCT 31.9* 31.0* 29.7* 32.4*  MCV 93.5 94.2 93.1 92.8  PLT 296 285 268 342   Cardiac Enzymes: No results for input(s): CKTOTAL, CKMB, CKMBINDEX, TROPONINI in the last 168 hours. BNP (last 3 results)  Recent Labs  11/29/15 2105 12/07/15 0200  BNP 816.0* 658.0*  ProBNP (last 3 results) No results for input(s): PROBNP in the last 8760 hours.  CBG:  Recent Labs Lab 12/07/15 0208  GLUCAP 122*    Recent Results (from the past 240 hour(s))  Urine culture     Status: None   Collection Time: 11/29/15  9:50 PM  Result Value Ref Range Status   Specimen Description URINE, CATHETERIZED  Final   Special Requests NONE  Final   Culture   Final    7,000 COLONIES/mL INSIGNIFICANT GROWTH Performed at [facility]    Report Status 12/01/2015 FINAL  Final  Blood culture  (routine x 2)     Status: None (Preliminary result)   Collection Time: 12/05/15  1:07 AM  Result Value Ref Range Status   Specimen Description BLOOD RIGHT HAND  Final   Special Requests BOTTLES DRAWN AEROBIC AND ANAEROBIC 8CC EACH  Final   Culture NO GROWTH 4 DAYS  Final   Report Status PENDING  Incomplete  Blood culture (routine x 2)     Status: None (Preliminary result)   Collection Time: 12/05/15  1:20 AM  Result Value Ref Range Status   Specimen Description BLOOD RIGHT HAND  Final   Special Requests   Final    BOTTLES DRAWN AEROBIC AND ANAEROBIC AEB=8CC ANA=6CC   Culture NO GROWTH 4 DAYS  Final   Report Status PENDING  Incomplete     Studies: No results found.  Scheduled Meds: . acetaminophen  650 mg Per Tube 3 times per day  . amLODipine  10 mg Per Tube Daily  . ampicillin-sulbactam (UNASYN) IV  3 g Intravenous Q8H  . carvedilol  6.25 mg Per Tube BID WC  . cloNIDine  0.3 mg Transdermal Weekly  . clopidogrel  75 mg Per Tube Daily  . DULoxetine  60 mg Oral Daily  . feeding supplement (JEVITY 1.5 CAL/FIBER)  237 mL Per Tube 5 X Daily  . free water  100 mL Per Tube 6 times per day  . heparin  5,000 Units Subcutaneous 3 times per day  . hydrALAZINE  25 mg Per Tube TID  . lacosamide  150 mg Per Tube BID  . levalbuterol  0.63 mg Nebulization BID  . levETIRAcetam  1,000 mg Per Tube BID  . levothyroxine  37.5 mcg Oral Once per day on Sun Sat  . levothyroxine  75 mcg Per Tube Once per day on Mon Tue Wed Thu Fri  . pantoprazole sodium  40 mg Per Tube Daily  . pravastatin  40 mg Per Tube q1800  . ranitidine  150 mg Per Tube Daily   Continuous Infusions: . sodium chloride 10 mL/hr at 12/08/15 2016    Principal Problem:   HCAP (healthcare-associated pneumonia) Active Problems:   Hypothyroid   Obesity   Pulmonary fibrosis (HCC)   Bence Jones proteinuria   CKD (chronic kidney disease) stage 3, GFR 30-59 ml/min   Hemiplegia affecting right dominant side (HCC)   Multiple  myeloma (HCC)   HLD (hyperlipidemia)   Seizure disorder (HCC)   PEG (percutaneous endoscopic gastrostomy) status (HCC)   Pressure ulcer   Aspiration pneumonia due to vomit (HCC)   DNR (do not resuscitate) discussion   Palliative care encounter    Time spent: 25 minutes. Greater than 50% of this time was spent in direct contact with the patient coordinating care.    Chaya Jan  [facility] Pager [phone]  If 7PM-7AM, please contact night-coverage at [email or web address], password [facility] 12/09/2015, 5:46 PM  LOS: 4  days

## 2015-12-09 NOTE — Progress Notes (Signed)
After nebulizer treatment, at 0816  This mornning family declined CPT, they wished patient to rest.

## 2015-12-10 LAB — CULTURE, BLOOD (ROUTINE X 2)
Culture: NO GROWTH
Culture: NO GROWTH

## 2015-12-10 MED ORDER — MORPHINE SULFATE (CONCENTRATE) 10 MG/0.5ML PO SOLN: 5.0000 mg | ORAL | Status: DC | PRN

## 2015-12-10 MED ORDER — MORPHINE SULFATE (CONCENTRATE) 10 MG/0.5ML PO SOLN: 10.0000 mg | ORAL | Status: DC | PRN

## 2015-12-10 MED ORDER — MORPHINE SULFATE (CONCENTRATE) 10 MG /0.5 ML PO SOLN: 10.0000 mg | ORAL | Status: DC | PRN

## 2015-12-10 MED ORDER — MORPHINE SULFATE (CONCENTRATE) 10 MG /0.5 ML PO SOLN
5.0000 mg | ORAL | Status: DC | PRN
Start: 2015-12-10 — End: 2015-12-10

## 2015-12-24 NOTE — Progress Notes (Signed)
[facility] PROGRESS NOTE  Melissa [name] QMV:[id number] DOB: 1946-07-03 DOA: 12/02/2015 PCP: [name], MD  Assessment/Plan: Aspiration pneumonia -Do not believe this represents healthcare associated pneumonia. -This is patient's third episode in 3 months. -Continue Unasyn for aspiration coverage. -Work of breathing has improved with morphine. Daughter still wants to wait another 48 hours on antibiotics to make a decision, however I suspect they will elect to proceed with residential hospice care. -Chest x-ray shows complete opacification of the left lung as well as a right basilar infiltrate. -Appreciate palliative care input and recommendation.  History of CVA -Bedbound with right hemiplegia, dysphagia and aphasia. -Continue Plavix and statin. -Continue tube feeds.  Chronic kidney disease stage III -At baseline.  Hypothyroidism -TSH within normal limits, continue home dose of Synthroid.  Hyperlipidemia -Continue statin.  Hypertension -Fair control, continue home medications, do not anticipate any medication changes.  Code Status: DO NOT RESUSCITATE Family Communication: Daughter [name] at bedside updated on plan of care  Disposition Plan: To be determined; likely home with hospice over the weekend or early next week.   Consultants:  None   Antibiotics:  Unasyn  Subjective: Lying in bed, unresponsive, appears comfortable.  Objective: Filed Vitals:   12/09/15 2211 01/08/2016 0631 01/08/2016 0815 2016-01-08 1437  BP: 157/144 163/72  186/75  Pulse: 92 90  74  Temp: 98.7 F (37.1 C) 98 F (36.7 C)  98.3 F (36.8 C)  TempSrc: Oral Oral  Oral  Resp: 18 18  20   Height:      Weight:      SpO2: 100% 100% 95% 97%    Intake/Output Summary (Last 24 hours) at 08-Jan-2016 1441 Last data filed at 2016-01-08 2841  Gross per 24 hour  Intake 1844.17 ml  Output   1050 ml  Net 794.17 ml   Filed Weights   12/05/15 0511  Weight: 82.555 kg (182 lb)     Exam:   General:  Drowsy, unable to arouse  Cardiovascular: Regular rate and rhythm, no murmurs, rubs or gallops  Respiratory: Intercostal retractions, abdominal breathing, decreased breath sounds to entire left lung fields  Abdomen: Soft, nontender, nondistended, positive bowel sounds  Extremities: 1+ pitting edema bilaterally   Neurologic:  Right hemiplegia, unable to fully assess given current mental state  Data Reviewed: Basic Metabolic Panel:  Recent Labs Lab 12/12/2015 2241 12/05/15 0550 12/06/15 0523 12/07/15 0200  NA 134* 136 136 137  K 4.0 4.0 3.8 3.8  CL 100* 105 106 102  CO2 28 28 27 26   GLUCOSE 107* 100* 89 146*  BUN 16 16 15 13   CREATININE 0.76 0.72 0.66 0.68  CALCIUM 9.2 9.0 8.9 9.5   Liver Function Tests:  Recent Labs Lab 12/11/2015 2241  AST 19  ALT 18  ALKPHOS 43  BILITOT 0.4  PROT 7.9  ALBUMIN 2.8*   No results for input(s): LIPASE, AMYLASE in the last 168 hours. No results for input(s): AMMONIA in the last 168 hours. CBC:  Recent Labs Lab 12/15/2015 2241 12/05/15 0550 12/06/15 0523 12/07/15 0200  WBC 12.5* 10.6* 10.1 11.5*  NEUTROABS 9.6*  --   --   --   HGB 11.2* 10.6* 10.2* 11.4*  HCT 31.9* 31.0* 29.7* 32.4*  MCV 93.5 94.2 93.1 92.8  PLT 296 285 268 342   Cardiac Enzymes: No results for input(s): CKTOTAL, CKMB, CKMBINDEX, TROPONINI in the last 168 hours. BNP (last 3 results)  Recent Labs  11/29/15 2105 12/07/15 0200  BNP 816.0* 658.0*  ProBNP (last 3 results) No results for input(s): PROBNP in the last 8760 hours.  CBG:  Recent Labs Lab 12/07/15 0208  GLUCAP 122*    Recent Results (from the past 240 hour(s))  Blood culture (routine x 2)     Status: None   Collection Time: 12/05/15  1:07 AM  Result Value Ref Range Status   Specimen Description BLOOD RIGHT HAND  Final   Special Requests BOTTLES DRAWN AEROBIC AND ANAEROBIC 8CC EACH  Final   Culture NO GROWTH 5 DAYS  Final   Report Status 13-Dec-2015 FINAL   Final  Blood culture (routine x 2)     Status: None   Collection Time: 12/05/15  1:20 AM  Result Value Ref Range Status   Specimen Description BLOOD RIGHT HAND  Final   Special Requests   Final    BOTTLES DRAWN AEROBIC AND ANAEROBIC AEB=8CC ANA=6CC   Culture NO GROWTH 5 DAYS  Final   Report Status 2015-12-13 FINAL  Final     Studies: No results found.  Scheduled Meds: . acetaminophen  650 mg Per Tube 3 times per day  . amLODipine  10 mg Per Tube Daily  . ampicillin-sulbactam (UNASYN) IV  3 g Intravenous Q8H  . carvedilol  6.25 mg Per Tube BID WC  . cloNIDine  0.3 mg Transdermal Weekly  . clopidogrel  75 mg Per Tube Daily  . DULoxetine  60 mg Oral Daily  . feeding supplement (JEVITY 1.5 CAL/FIBER)  237 mL Per Tube 5 X Daily  . free water  100 mL Per Tube 6 times per day  . heparin  5,000 Units Subcutaneous 3 times per day  . hydrALAZINE  25 mg Per Tube TID  . lacosamide  150 mg Per Tube BID  . levalbuterol  0.63 mg Nebulization BID  . levETIRAcetam  1,000 mg Per Tube BID  . levothyroxine  37.5 mcg Oral Once per day on Sun Sat  . levothyroxine  75 mcg Per Tube Once per day on Mon Tue Wed Thu Fri  . pantoprazole sodium  40 mg Per Tube Daily  . pravastatin  40 mg Per Tube q1800  . ranitidine  150 mg Per Tube Daily   Continuous Infusions: . sodium chloride 10 mL/hr at 12/08/15 2016    Principal Problem:   HCAP (healthcare-associated pneumonia) Active Problems:   Hypothyroid   Obesity   Pulmonary fibrosis (HCC)   Bence Jones proteinuria   CKD (chronic kidney disease) stage 3, GFR 30-59 ml/min   Hemiplegia affecting right dominant side (HCC)   Multiple myeloma (HCC)   HLD (hyperlipidemia)   Seizure disorder (HCC)   PEG (percutaneous endoscopic gastrostomy) status (HCC)   Pressure ulcer   Aspiration pneumonia due to vomit (HCC)   DNR (do not resuscitate) discussion   Palliative care encounter    Time spent: 25 minutes. Greater than 50% of this time was spent in  direct contact with the patient coordinating care.    Chaya Jan  [facility] Pager [phone]  If 7PM-7AM, please contact night-coverage at [email or web address], password [facility] 12/13/15, 2:41 PM  LOS: 5 days

## 2015-12-24 NOTE — Progress Notes (Signed)
Approximately 1140 family declined CPT for patient, as patient was due for medication and to "freshened up."

## 2015-12-24 NOTE — Progress Notes (Signed)
ANTIBIOTIC CONSULT NOTE - INITIAL  Pharmacy Consult for Unasyn Indication: Aspiration Pneumonia  Allergies  Allergen Reactions  . Morphine Nausea And Vomiting    Patient Measurements: Height: 5\' 6"  (167.6 cm) Weight: 182 lb (82.555 kg) IBW/kg (Calculated) : 59.3  Vital Signs: Temp: 98 F (36.7 C) (12/18 0631) Temp Source: Oral (12/18 0631) BP: 163/72 mmHg (12/18 0631) Pulse Rate: 90 (12/18 0631) Intake/Output from previous day: 12/17 0701 - 12/18 0700 In: 2381.2 [I.V.:116.2; NG/GT:1965; IV Piggyback:300] Out: 1425 [Urine:1425] Intake/Output from this shift:    Labs: No results for input(s): WBC, HGB, PLT, LABCREA, CREATININE in the last 72 hours. Estimated Creatinine Clearance: 71.9 mL/min (by C-G formula based on Cr of 0.68). No results for input(s): VANCOTROUGH, VANCOPEAK, VANCORANDOM, GENTTROUGH, GENTPEAK, GENTRANDOM, TOBRATROUGH, TOBRAPEAK, TOBRARND, AMIKACINPEAK, AMIKACINTROU, AMIKACIN in the last 72 hours.   Microbiology: Recent Results (from the past 720 hour(s))  Urine culture     Status: None   Collection Time: 11/29/15  9:50 PM  Result Value Ref Range Status   Specimen Description URINE, CATHETERIZED  Final   Special Requests NONE  Final   Culture   Final    7,000 COLONIES/mL INSIGNIFICANT GROWTH Performed at [facility]    Report Status 12/01/2015 FINAL  Final  Blood culture (routine x 2)     Status: None   Collection Time: 12/05/15  1:07 AM  Result Value Ref Range Status   Specimen Description BLOOD RIGHT HAND  Final   Special Requests BOTTLES DRAWN AEROBIC AND ANAEROBIC 8CC EACH  Final   Culture NO GROWTH 5 DAYS  Final   Report Status 12/23/2015 FINAL  Final  Blood culture (routine x 2)     Status: None   Collection Time: 12/05/15  1:20 AM  Result Value Ref Range Status   Specimen Description BLOOD RIGHT HAND  Final   Special Requests   Final    BOTTLES DRAWN AEROBIC AND ANAEROBIC AEB=8CC ANA=6CC   Culture NO GROWTH 5 DAYS  Final   Report Status 11/28/2015 FINAL  Final    Medical History: Past Medical History  Diagnosis Date  . Vertigo, intermittent   . Hypothyroidism   . Hyperlipidemia   . Obesity   . Hypertension     severe and resistant to treatment; 2008-negative left renal angiogram  . Vitiligo   . Degenerative disc disease, lumbar   . Degenerative disc disease, cervical   . Chest pain 2008    2008-normal coronary angiography  . Stroke (HCC) 09/18/2014    right hemiparesis  . Renal cell carcinoma     Right nephrectomy  . Multiple myeloma (HCC) 08/26/2014  . History of pneumonia   . Emphysema of lung (HCC)   . CVA (cerebral infarction) 06/2015    Medications:  Clindamycin 12/14>>12/15  Assessment: 70 y.o. female with hx of prior CVA, resulting in aphasia, bedbound, right hemiplegia and left hemiparesis, dysphagia requiring PEG and TF ( Jevity 5 cans per day), and strict NPO, hypothyroidism, HLD, HTN, recent seizure [facility], August 2016, Dr Gerilyn Pilgrim), on Vimpat and Keppra, no longer on Dilantin, lives at home taken care of by her only 2 daughters, brought to the ER as she was once again having fever and productive coughs. Work up in the ER with CXR showed LLL infiltrate unclear if new PNA or aspiration. She is currently afebrile, tmax 101.4. WBC 11.5. Patient has been diaphoretic and has labored respirations. Change antibiotics to Unasyn for broader coverage of aspiration. Blood cx no growth to date  and finalized. Chest xray still c/w RLL PNA. Day 4 of Unasyn, and pt received 2 days of Vanc and Zosyn from admission. Afebrile. No new labs. On 3 L O2 by Nasal cannula.  Continue current management until advised otherwise.  Goal of Therapy:  resolution of infection  Plan:  Unasyn 3gm IV q8h Monitor V/S and labs Follow up culture results   Elder Cyphers, BS Loura Back, BCPS Clinical Pharmacist Pager [phone] 12/02/2015,2:09 PM

## 2015-12-24 NOTE — Progress Notes (Signed)
Late entry:  At approximately 15:15 this afternoon I was called to the bedside by family who were concerned w/pt's breathing and small amount of blood when mouth was swabbed.  When I arrived pt's respirations were 12/min and pt was unresponsive.  Dr. Jerilee Hoh was paged.  Over the course of the next 15 mins pt's respirations decreased to 8/min, BP 56/34, pulse 60.  At 1535 pt was unresponsive, without respirations or pulse and with fixed pupils.  Pt was a DNR.  Family was at bedside.

## 2015-12-24 DEATH — deceased

## 2016-01-17 ENCOUNTER — Ambulatory Visit (HOSPITAL_COMMUNITY): Payer: Self-pay | Admitting: Oncology

## 2016-01-17 ENCOUNTER — Other Ambulatory Visit (HOSPITAL_COMMUNITY): Payer: Self-pay

## 2016-01-24 NOTE — Discharge Summary (Signed)
Death summary  Patient was a 69 year old woman initially admitted on 12/05/2015 for fever and cough. She had a prior history of a CVA resulting in aphasia, she is bedbound with right hemiplegia. In the emergency department she was found to have a left lower lobe infiltrate and was admitted to the hospital for treatment of presumed aspiration pneumonia. Given patient's poor long-term prognosis and what was perceived to be as decreased quality of life, palliative care consultation was obtained. We had multiple discussions with family members. They ultimately elected to proceed with residential hospice. At some point in her hospitalization she had worsening respiratory distress and chest x-ray showed complete opacification of the left lung as well as a new right basilar infiltrate. She was started on boluses of morphine and Xanax for pain and agitation. On 12/18 at [place] patient was noted to have expired by nursing staff.  Causes of death -Recurrent aspiration pneumonia -History of CVA with right hemiplegia, dysphagia, a fascia and bedbound status.  Domingo Mend, MD [facility] Pager: [phone]

## 2016-10-22 IMAGING — MR MR HEAD W/O CM
11 of 13 series · 33 of 48 positions shown · non-contrast
Comparison: Head CT same day.  MRI 09/19/2014.

CLINICAL DATA: Chronic right sided weakness from previous stroke.
Left-sided seizure today.

EXAM:
MRI HEAD WITHOUT CONTRAST
TECHNIQUE: Multiplanar, multiecho pulse sequences of the brain and surrounding
structures were obtained without intravenous contrast.

[Series 2: FLAIR · sagittal · 5.0mm · 0.47mm/px · 2 of 23 slices shown (1 of 2)]
[im 1/23]
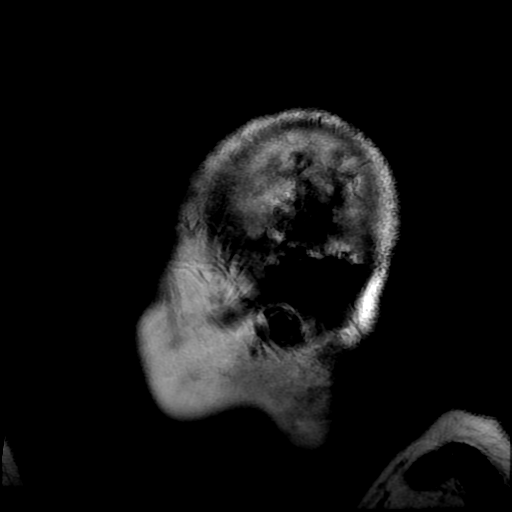
[im 23/23]
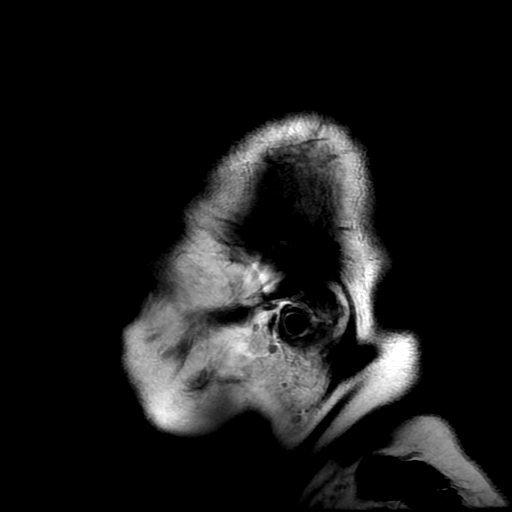

[Series 4: DWI · axial · 3.0mm · 0.94mm/px · z∈[-138,+7]mm · 8 of 99 slices shown (1 of 4)]
[im 1/99]
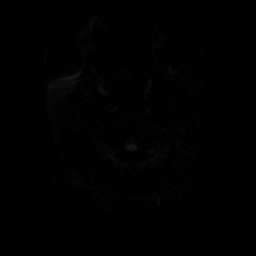
[im 15/99]
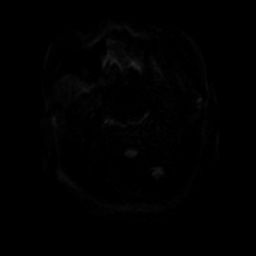
[im 29/99]
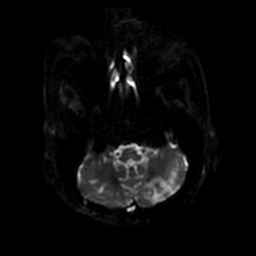
[im 43/99]
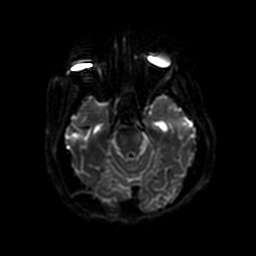
[im 57/99]
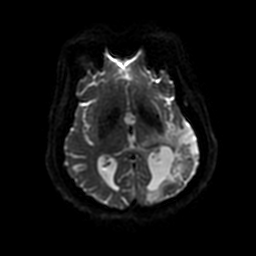
[im 71/99]
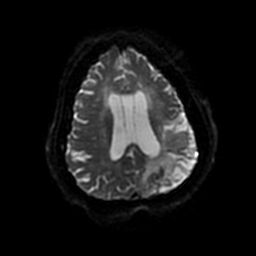
[im 85/99]
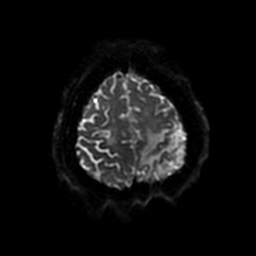
[im 99/99]
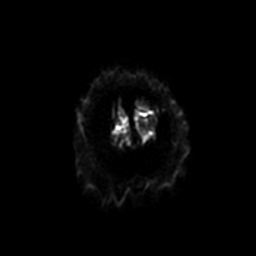

[Series 5: T2 · axial · 5.0mm · 0.47mm/px · z∈[-137,+5]mm · 2 of 25 slices shown (1 of 2)]
[im 1/25]
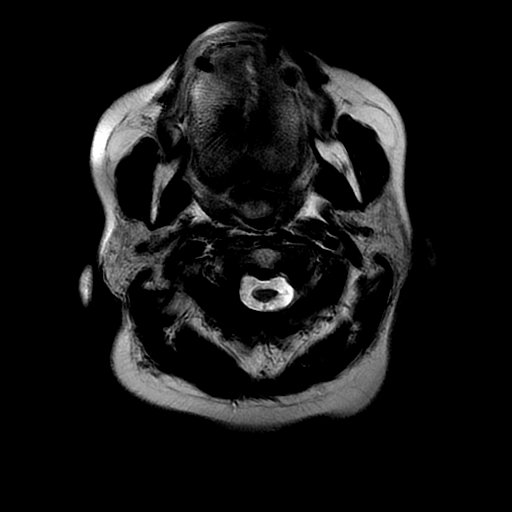
[im 25/25]
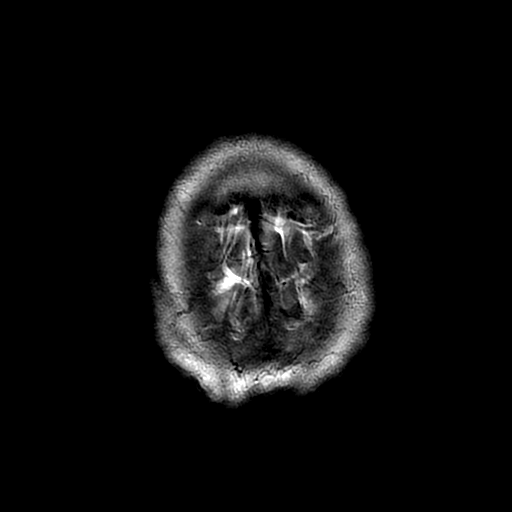

[Series 6: FLAIR · axial · 5.0mm · 0.47mm/px · z∈[-137,+5]mm · 2 of 25 slices shown (2 of 2)]
[im 1/25]
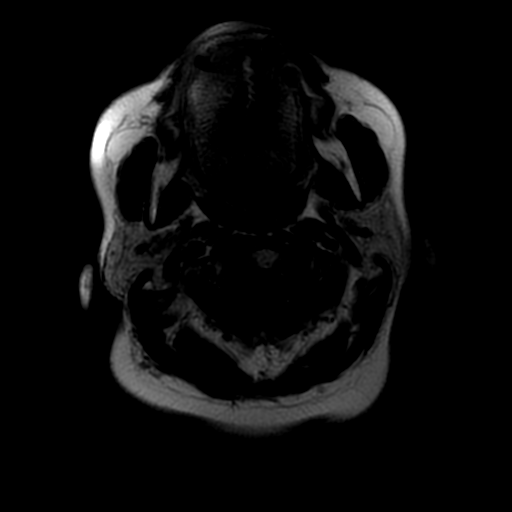
[im 25/25]
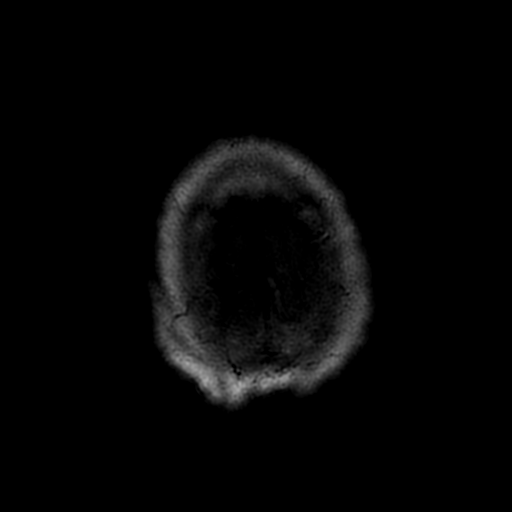

[Series 7: DWI · coronal · 5.0mm · 0.94mm/px · 5 of 64 slices shown (2 of 4)]
[im 1/64]
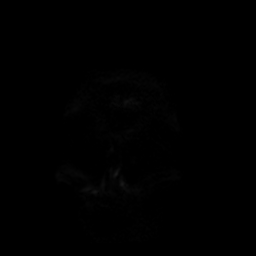
[im 16/64]
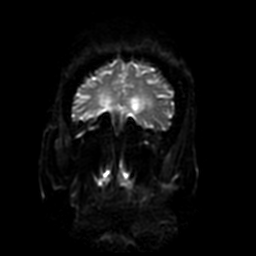
[im 32/64]
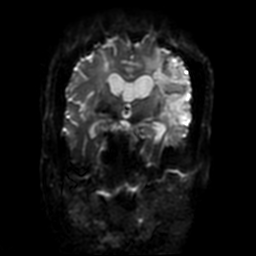
[im 48/64]
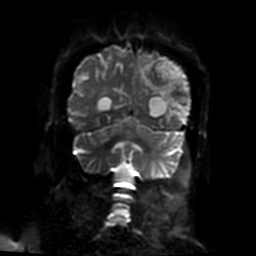
[im 64/64]
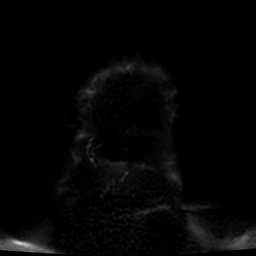

[Series 10: T2 fat-sat · coronal · 3.0mm · 0.43mm/px · 2 of 25 slices shown (1 of 2)]
[im 1/25]
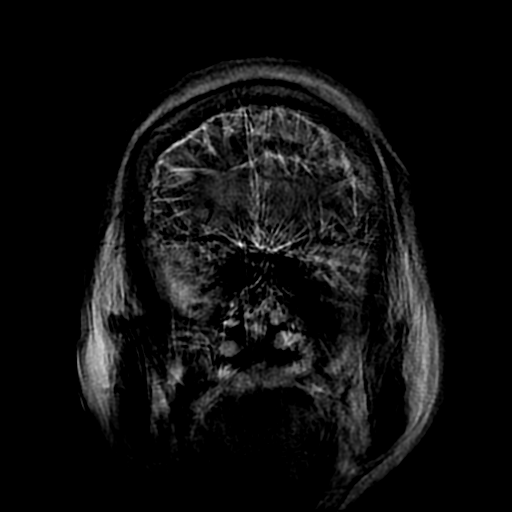
[im 25/25]
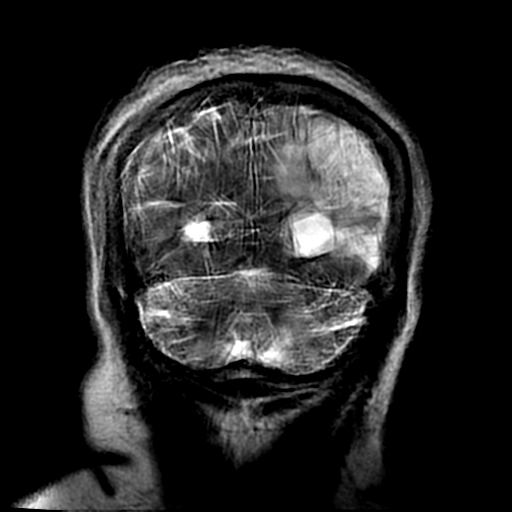

[Series 11: T2 · coronal · 5.0mm · 0.47mm/px · 2 of 27 slices shown (2 of 2)]
[im 1/27]
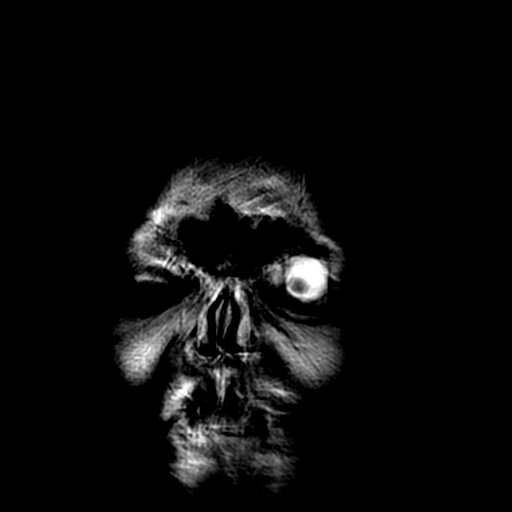
[im 27/27]
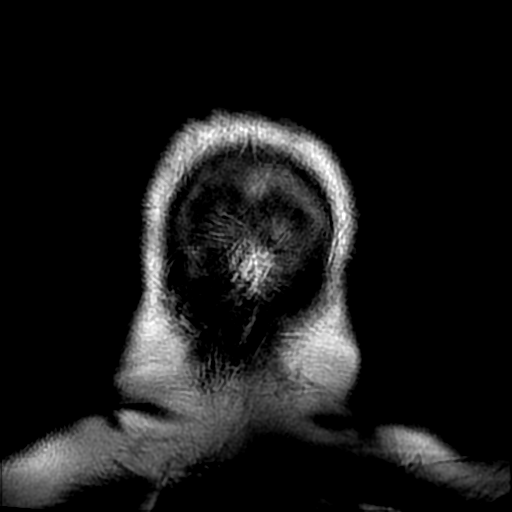

[Series 12: GRE · axial · 5.0mm · 0.47mm/px · 1 of 25 slices shown]
[im 1/25]
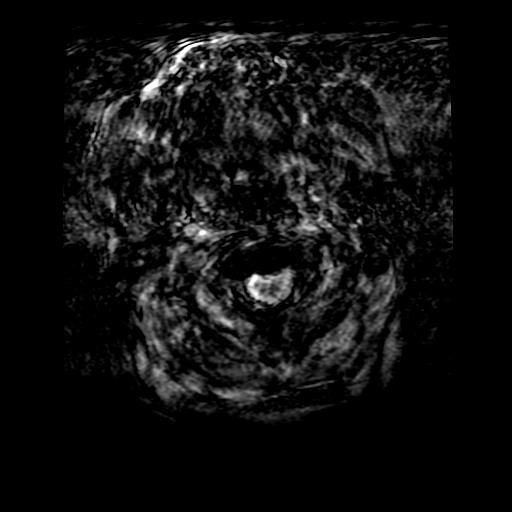

[Series 13: T2 fat-sat · coronal · 3.0mm · 0.43mm/px · 2 of 25 slices shown (2 of 2)]
[im 1/25]
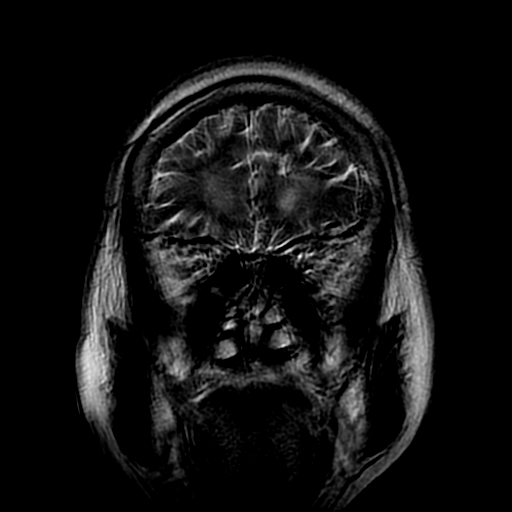
[im 25/25]
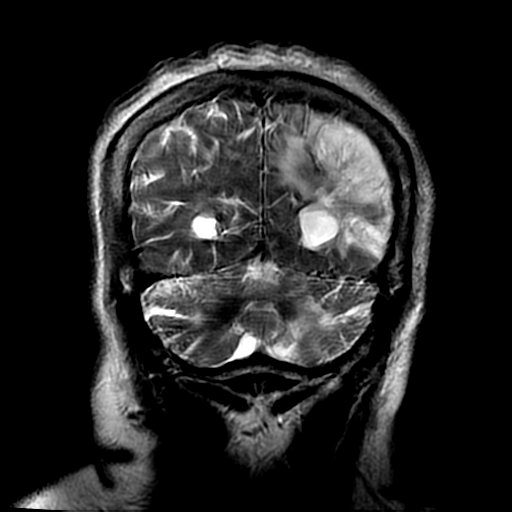

[Series 400: DWI · axial · 3.0mm · 0.94mm/px · z∈[-138,+7]mm · 4 of 50 slices shown (3 of 4)]
[im 1/50]
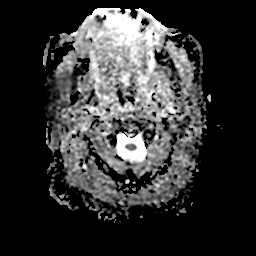
[im 17/50]
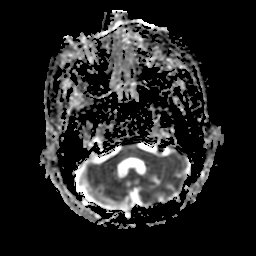
[im 33/50]
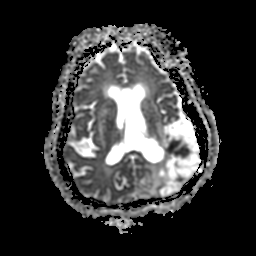
[im 50/50]
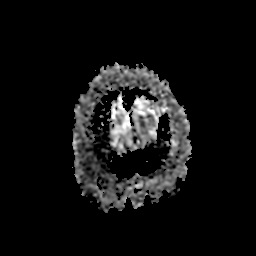

[Series 700: DWI · coronal · 5.0mm · 0.94mm/px · 3 of 32 slices shown (4 of 4)]
[im 1/32]
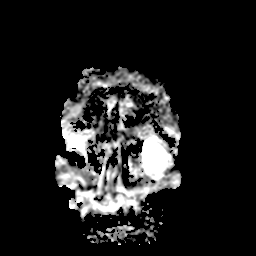
[im 16/32]
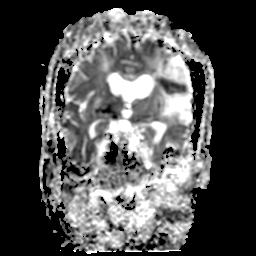
[im 32/32]
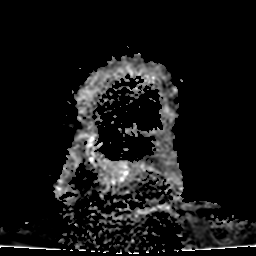

[33 of 48 positions shown; findings below may reference images not displayed]

FINDINGS: The study suffers from motion degradation. There chronic
small-vessel ischemic changes throughout the pons. There are
numerous old cerebellar infarctions bilaterally. No acute cerebellar
insult. There is old infarction in the left MCA territory affecting
the posterior temporal lobe and parietal lobe on the left . This has
progressed to atrophy and encephalomalacia with evidence of old
laminar necrosis. In the region of old infarction, there are areas
of acute extension. There are 1 or 2 punctate foci of acute
infarction elsewhere in the left frontal lobe. No evidence of
swelling or acute hemorrhage. Chronic small vessel ischemic changes
affect the cerebral hemisphere white matter bilaterally. There is an
old right frontoparietal infarction at the vertex. No evidence of
mass lesion, hydrocephalus or extra-axial collection. Major vessels
at the base of the brain show flow.
IMPRESSION: Old infarction in the left MCA territory affecting the posterior
temporal lobe and parietal region having progressed to atrophy,
encephalomalacia and gliosis. Areas of acute infarction representing
extension of infarction in that region. 2 punctate foci of acute
infarction separate from that area in the left frontal lobe.

Extensive old infarctions elsewhere throughout the brain as outlined
above.

## 2016-10-22 IMAGING — CR DG CHEST 1V
1 series · 1 of 1 positions shown · non-contrast
Comparison: 09/28/2014.

CLINICAL DATA: Muscle twitching.

EXAM:
CHEST  1 VIEW

[chest ap]
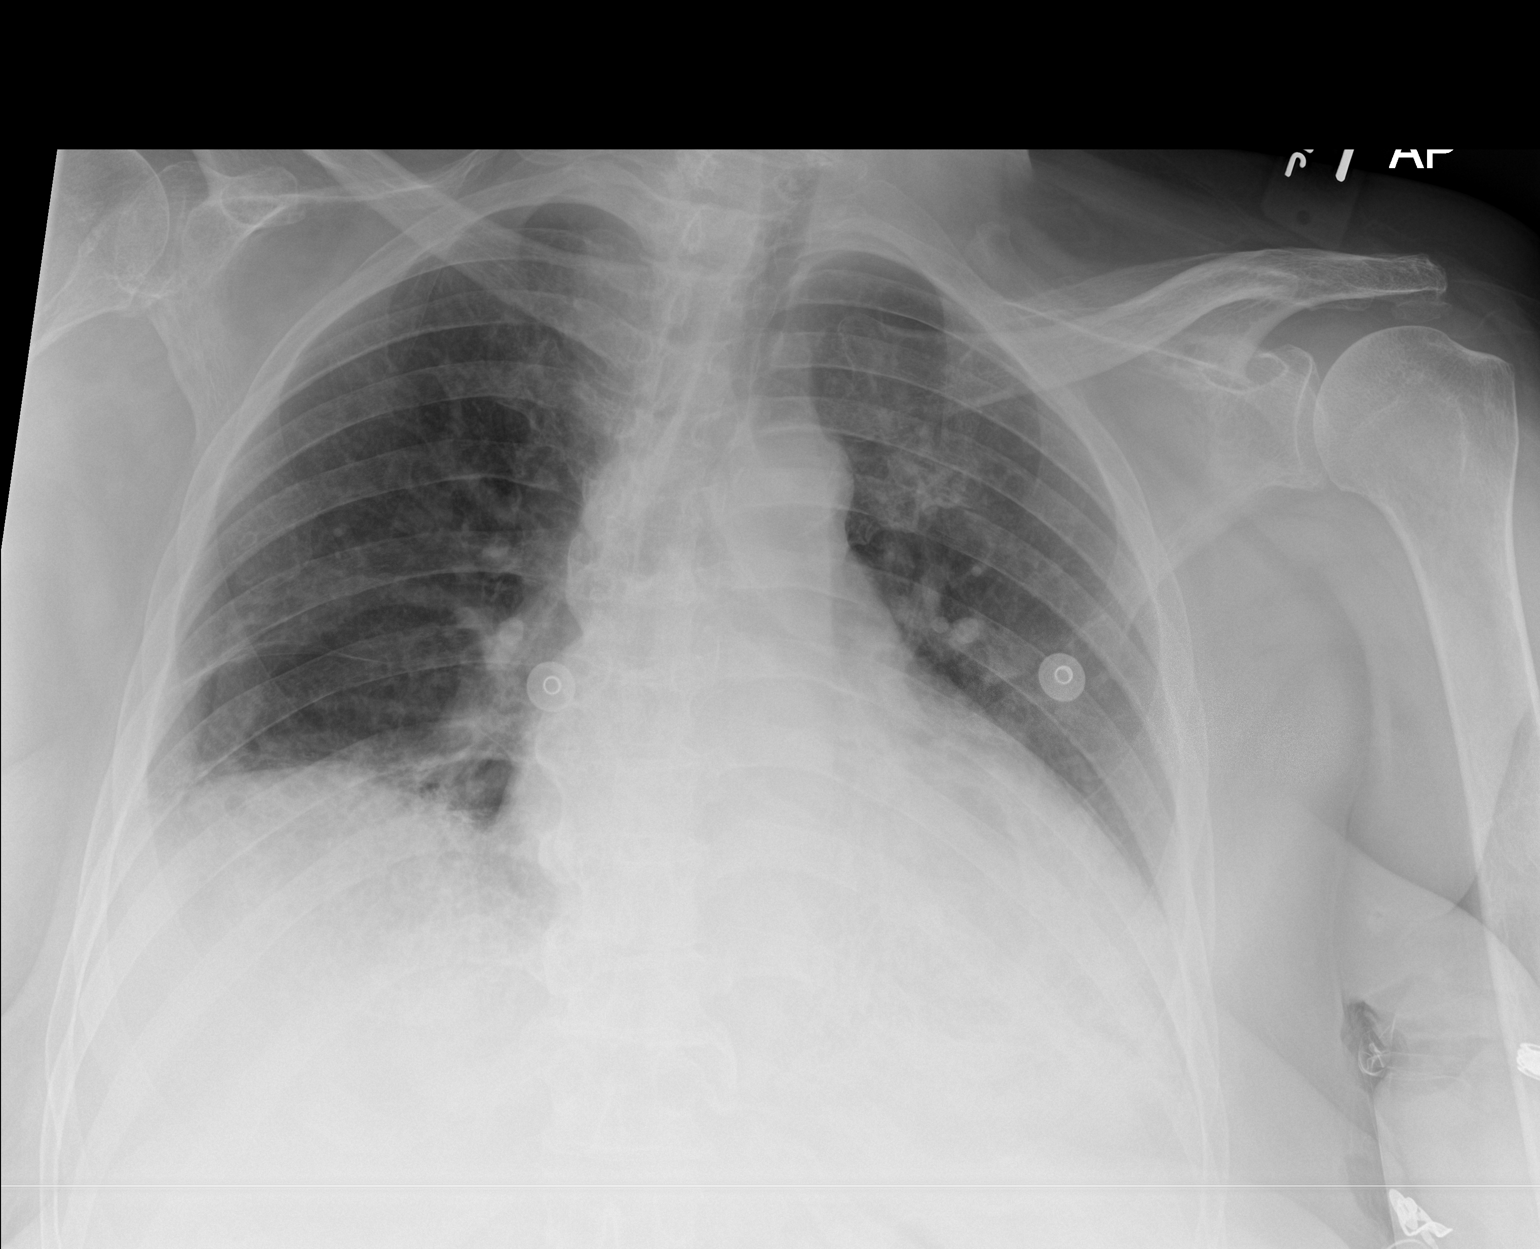

[1 of 1 positions shown; findings below may reference images not displayed]

FINDINGS: Stable enlarged cardiac silhouette. Left lower lobe opacity. Mild
linear density at the right lung base. Thoracic spine degenerative
changes.
IMPRESSION: 1. Left lower lobe atelectasis or pneumonia.
2. Mild right basilar atelectasis.
3. Stable cardiomegaly.

## 2016-10-22 IMAGING — CR DG HIP (WITH OR WITHOUT PELVIS) 2-3V*L*
4 series · 4 of 4 positions shown · non-contrast
Comparison: CT scan 11/06/2014

CLINICAL DATA: Left hip pain following a seizure today.

EXAM:
DG HIP (WITH OR WITHOUT PELVIS) 2-3V LEFT

[hip ap (1 of 2)]
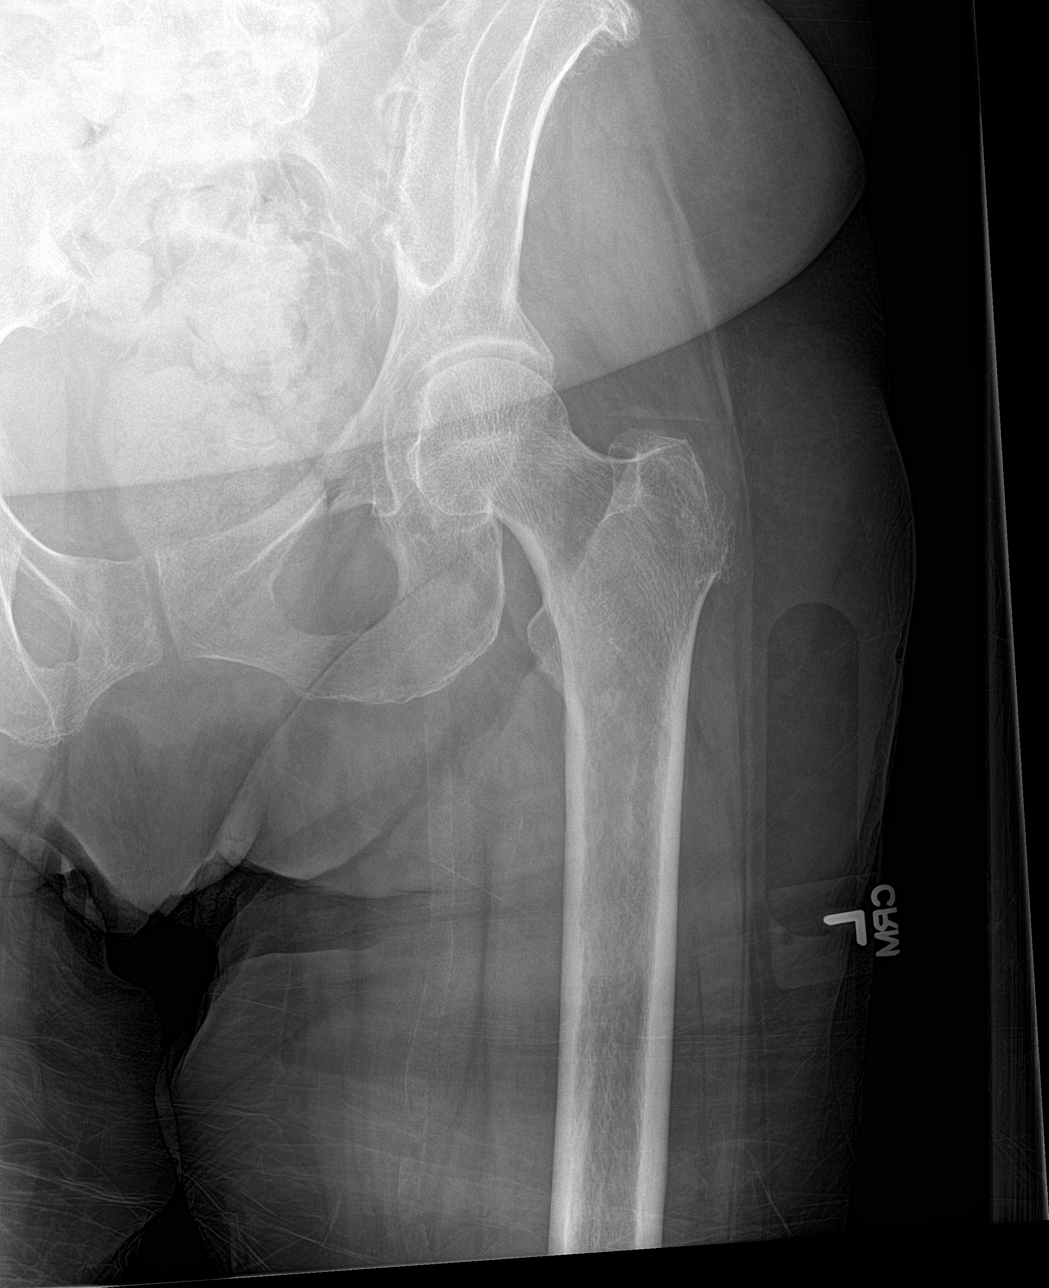

[hip lat]
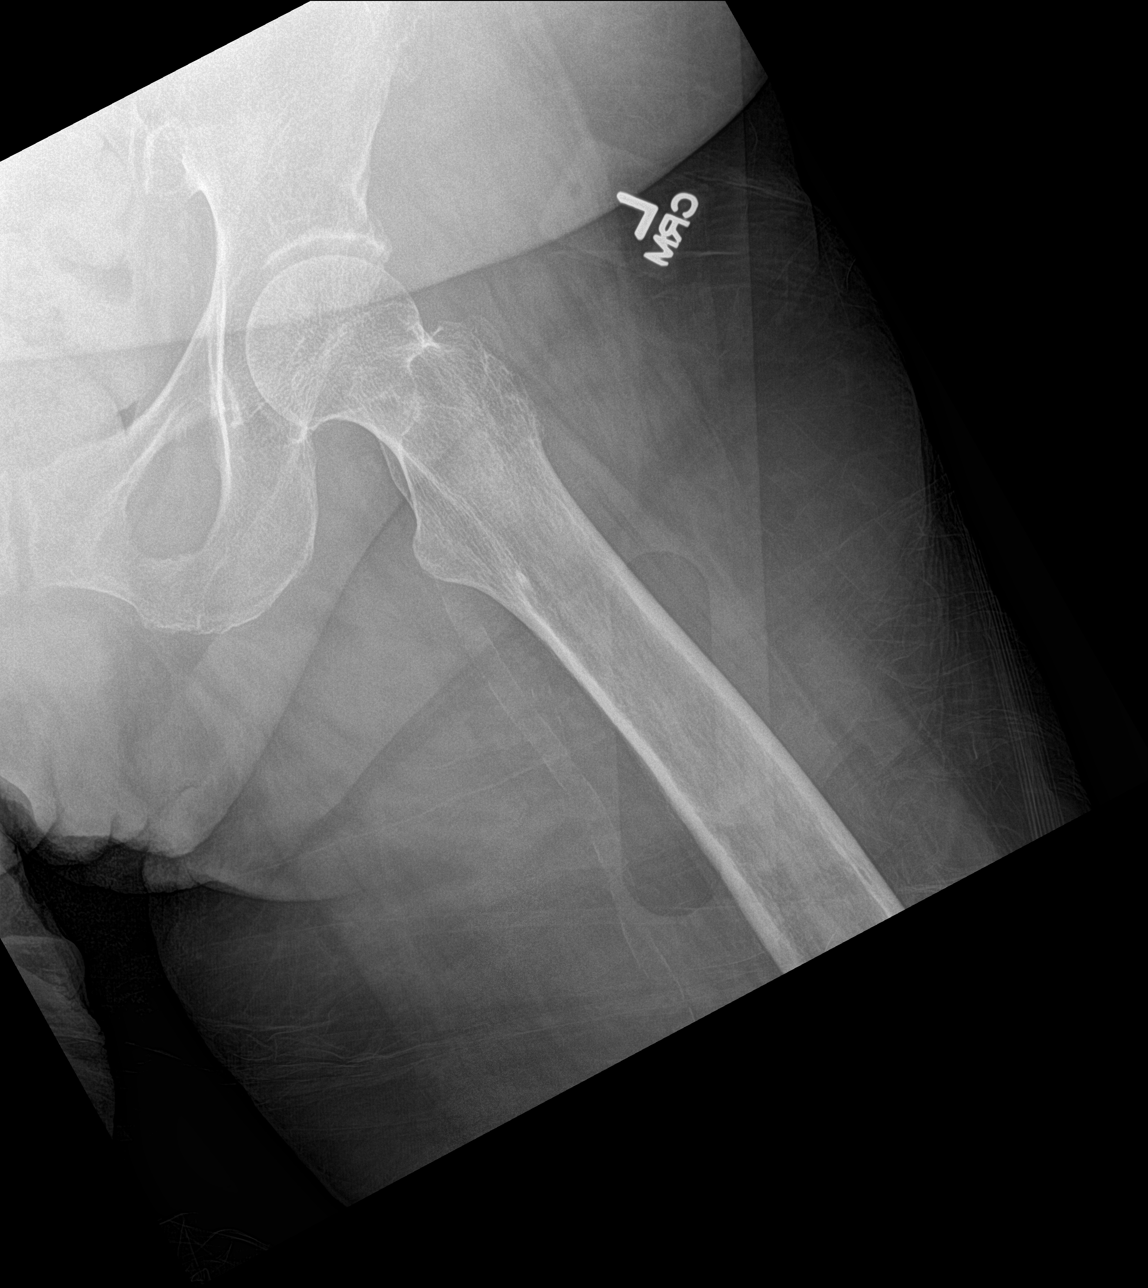

[pelvis ap]
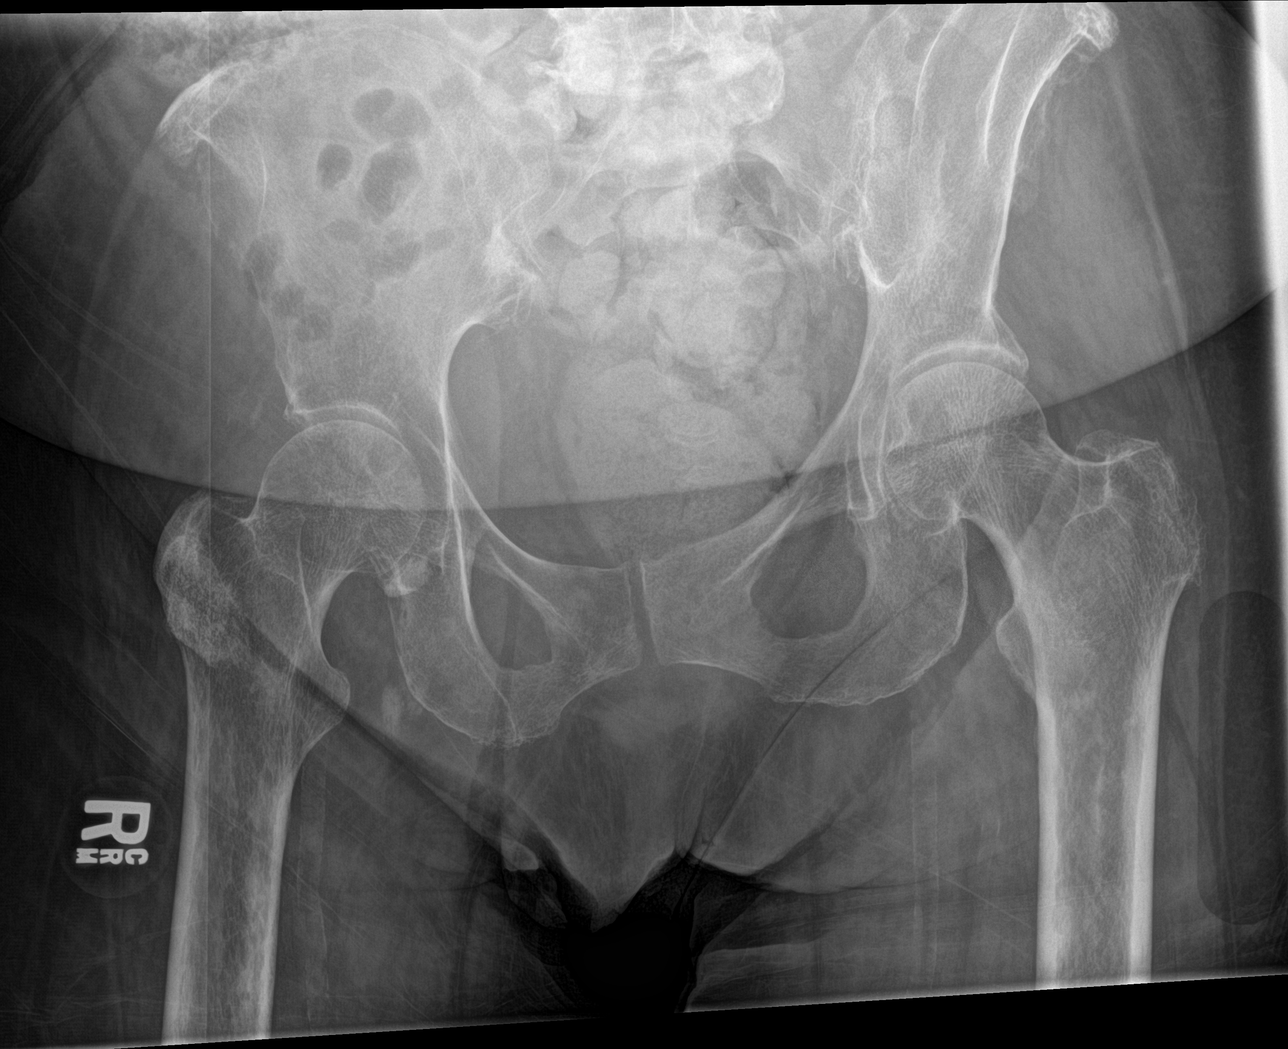

[hip ap (2 of 2)]
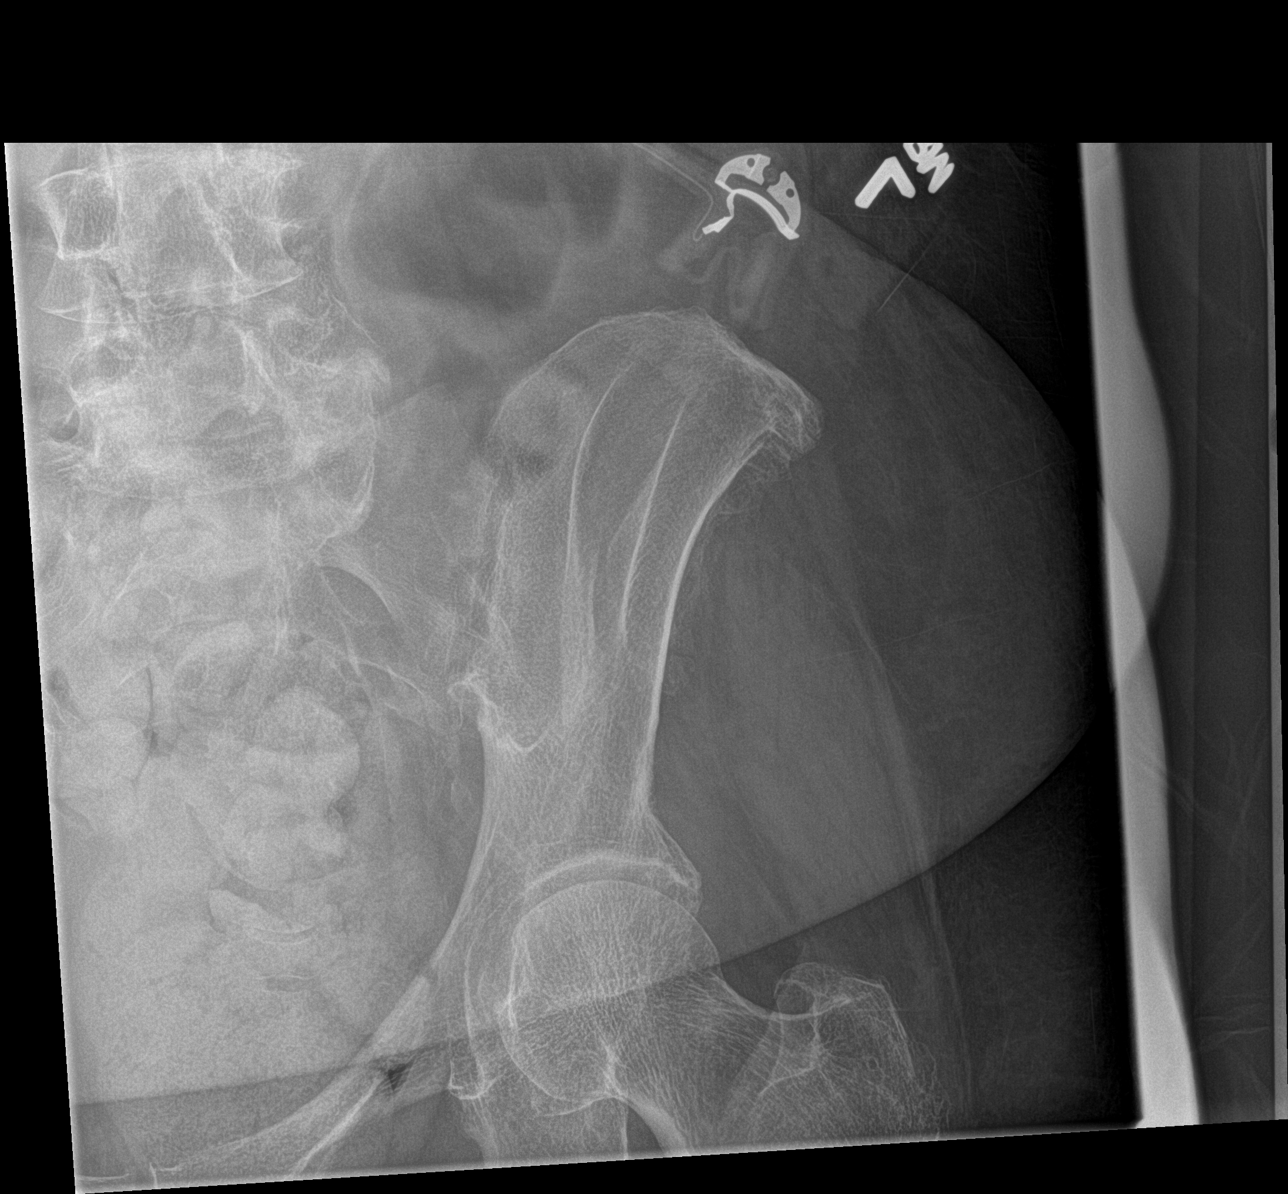

[4 of 4 positions shown; findings below may reference images not displayed]

FINDINGS: Both hips are normally located. Mild degenerative changes but no
acute hip fracture. No definite pelvic fractures. Vascular
calcifications are noted.
IMPRESSION: No definite acute hip or pelvic fractures.
